# Patient Record
Sex: Male | Born: 1955
Health system: Southern US, Community
[De-identification: ages and names within clinical notes are randomized; demographics above are authoritative.]

## PROBLEM LIST (undated history)

## (undated) DIAGNOSIS — R0989 Other specified symptoms and signs involving the circulatory and respiratory systems: Secondary | ICD-10-CM

## (undated) DIAGNOSIS — Z91199 Patient's noncompliance with other medical treatment and regimen due to unspecified reason: Secondary | ICD-10-CM

## (undated) DIAGNOSIS — N183 Chronic kidney disease, stage 3 unspecified: Secondary | ICD-10-CM

## (undated) DIAGNOSIS — E785 Hyperlipidemia, unspecified: Secondary | ICD-10-CM

## (undated) DIAGNOSIS — E119 Type 2 diabetes mellitus without complications: Secondary | ICD-10-CM

## (undated) DIAGNOSIS — Z9119 Patient's noncompliance with other medical treatment and regimen: Secondary | ICD-10-CM

## (undated) DIAGNOSIS — I739 Peripheral vascular disease, unspecified: Secondary | ICD-10-CM

## (undated) DIAGNOSIS — I5189 Other ill-defined heart diseases: Secondary | ICD-10-CM

## (undated) DIAGNOSIS — I2510 Atherosclerotic heart disease of native coronary artery without angina pectoris: Secondary | ICD-10-CM

## (undated) DIAGNOSIS — I10 Essential (primary) hypertension: Secondary | ICD-10-CM

## (undated) HISTORY — PX: CORONARY ARTERY BYPASS GRAFT: SHX141

---

## 1997-06-05 ENCOUNTER — Emergency Department (HOSPITAL_COMMUNITY): Admission: EM | Admit: 1997-06-05 | Discharge: 1997-06-05 | Payer: Self-pay | Admitting: Emergency Medicine

## 2001-01-06 ENCOUNTER — Emergency Department (HOSPITAL_COMMUNITY): Admission: EM | Admit: 2001-01-06 | Discharge: 2001-01-06 | Payer: Self-pay | Admitting: Emergency Medicine

## 2001-01-06 ENCOUNTER — Encounter: Payer: Self-pay | Admitting: Emergency Medicine

## 2001-01-28 DIAGNOSIS — I25110 Atherosclerotic heart disease of native coronary artery with unstable angina pectoris: Secondary | ICD-10-CM | POA: Insufficient documentation

## 2001-01-28 DIAGNOSIS — I2510 Atherosclerotic heart disease of native coronary artery without angina pectoris: Secondary | ICD-10-CM

## 2001-04-06 ENCOUNTER — Emergency Department (HOSPITAL_COMMUNITY): Admission: EM | Admit: 2001-04-06 | Discharge: 2001-04-06 | Payer: Self-pay | Admitting: Emergency Medicine

## 2001-08-14 ENCOUNTER — Encounter: Payer: Self-pay | Admitting: Emergency Medicine

## 2001-08-14 ENCOUNTER — Observation Stay (HOSPITAL_COMMUNITY): Admission: EM | Admit: 2001-08-14 | Discharge: 2001-08-16 | Payer: Self-pay | Admitting: Emergency Medicine

## 2001-08-15 ENCOUNTER — Encounter: Payer: Self-pay | Admitting: Cardiology

## 2001-09-15 ENCOUNTER — Inpatient Hospital Stay (HOSPITAL_COMMUNITY): Admission: EM | Admit: 2001-09-15 | Discharge: 2001-09-19 | Payer: Self-pay | Admitting: Emergency Medicine

## 2001-09-15 ENCOUNTER — Encounter: Payer: Self-pay | Admitting: Cardiology

## 2002-02-25 ENCOUNTER — Emergency Department (HOSPITAL_COMMUNITY): Admission: EM | Admit: 2002-02-25 | Discharge: 2002-02-25 | Payer: Self-pay

## 2002-04-30 ENCOUNTER — Emergency Department (HOSPITAL_COMMUNITY): Admission: EM | Admit: 2002-04-30 | Discharge: 2002-04-30 | Payer: Self-pay

## 2002-07-13 ENCOUNTER — Encounter: Admission: RE | Admit: 2002-07-13 | Discharge: 2002-08-11 | Payer: Self-pay | Admitting: Orthopaedic Surgery

## 2002-09-07 ENCOUNTER — Inpatient Hospital Stay (HOSPITAL_COMMUNITY): Admission: EM | Admit: 2002-09-07 | Discharge: 2002-09-09 | Payer: Self-pay | Admitting: Emergency Medicine

## 2002-09-07 ENCOUNTER — Encounter: Payer: Self-pay | Admitting: Emergency Medicine

## 2002-12-19 ENCOUNTER — Emergency Department (HOSPITAL_COMMUNITY): Admission: EM | Admit: 2002-12-19 | Discharge: 2002-12-19 | Payer: Self-pay | Admitting: Emergency Medicine

## 2003-01-29 DIAGNOSIS — E119 Type 2 diabetes mellitus without complications: Secondary | ICD-10-CM | POA: Insufficient documentation

## 2003-06-22 ENCOUNTER — Emergency Department (HOSPITAL_COMMUNITY): Admission: EM | Admit: 2003-06-22 | Discharge: 2003-06-22 | Payer: Self-pay | Admitting: Emergency Medicine

## 2003-09-17 ENCOUNTER — Emergency Department (HOSPITAL_COMMUNITY): Admission: EM | Admit: 2003-09-17 | Discharge: 2003-09-17 | Payer: Self-pay | Admitting: Emergency Medicine

## 2006-05-21 ENCOUNTER — Emergency Department (HOSPITAL_COMMUNITY): Admission: EM | Admit: 2006-05-21 | Discharge: 2006-05-21 | Payer: Self-pay | Admitting: Emergency Medicine

## 2006-05-23 ENCOUNTER — Emergency Department (HOSPITAL_COMMUNITY): Admission: EM | Admit: 2006-05-23 | Discharge: 2006-05-23 | Payer: Self-pay | Admitting: Family Medicine

## 2006-05-26 ENCOUNTER — Ambulatory Visit: Payer: Self-pay | Admitting: Cardiology

## 2006-06-05 ENCOUNTER — Ambulatory Visit: Payer: Self-pay | Admitting: Internal Medicine

## 2006-06-19 ENCOUNTER — Ambulatory Visit: Payer: Self-pay | Admitting: Cardiology

## 2006-07-01 ENCOUNTER — Ambulatory Visit: Payer: Self-pay | Admitting: Internal Medicine

## 2006-07-15 ENCOUNTER — Ambulatory Visit: Payer: Self-pay | Admitting: Internal Medicine

## 2006-08-01 ENCOUNTER — Inpatient Hospital Stay (HOSPITAL_COMMUNITY): Admission: EM | Admit: 2006-08-01 | Discharge: 2006-08-06 | Payer: Self-pay | Admitting: Emergency Medicine

## 2006-08-01 ENCOUNTER — Ambulatory Visit: Payer: Self-pay | Admitting: Cardiology

## 2007-10-22 ENCOUNTER — Ambulatory Visit: Payer: Self-pay | Admitting: Nurse Practitioner

## 2007-10-22 DIAGNOSIS — E7800 Pure hypercholesterolemia, unspecified: Secondary | ICD-10-CM | POA: Insufficient documentation

## 2007-10-22 DIAGNOSIS — F4321 Adjustment disorder with depressed mood: Secondary | ICD-10-CM | POA: Insufficient documentation

## 2007-10-22 DIAGNOSIS — Z9115 Patient's noncompliance with renal dialysis: Secondary | ICD-10-CM

## 2007-10-22 LAB — CONVERTED CEMR LAB
Blood Glucose, Fingerstick: 153
Glucose, Urine, Semiquant: NEGATIVE
Hgb A1c MFr Bld: 7.6 %
Protein, U semiquant: 100
Specific Gravity, Urine: >=1.03
Urobilinogen, UA: 1

## 2007-10-23 ENCOUNTER — Ambulatory Visit: Payer: Self-pay | Admitting: *Deleted

## 2007-10-23 ENCOUNTER — Telehealth (INDEPENDENT_AMBULATORY_CARE_PROVIDER_SITE_OTHER): Payer: Self-pay | Admitting: Nurse Practitioner

## 2007-10-23 DIAGNOSIS — N200 Calculus of kidney: Secondary | ICD-10-CM | POA: Insufficient documentation

## 2007-10-23 DIAGNOSIS — M549 Dorsalgia, unspecified: Secondary | ICD-10-CM | POA: Insufficient documentation

## 2007-10-23 DIAGNOSIS — K219 Gastro-esophageal reflux disease without esophagitis: Secondary | ICD-10-CM | POA: Insufficient documentation

## 2007-10-23 LAB — CONVERTED CEMR LAB
ALT: 19 units/L (ref 0–53)
Albumin: 4.3 g/dL (ref 3.5–5.2)
Alkaline Phosphatase: 88 units/L (ref 39–117)
Basophils Absolute: 0 10*3/uL (ref 0.0–0.1)
CO2: 24 meq/L (ref 19–32)
Calcium: 9.3 mg/dL (ref 8.4–10.5)
HDL: 32 mg/dL — ABNORMAL LOW (ref >39)
LDL Cholesterol: 167 mg/dL — ABNORMAL HIGH (ref 0–99)
Lymphs Abs: 1.9 10*3/uL (ref 0.7–4.0)
Neutro Abs: 3.3 10*3/uL (ref 1.7–7.7)
Neutrophils Relative %: 59 % (ref 43–77)
RBC: 5.23 M/uL (ref 4.22–5.81)
Sodium: 137 meq/L (ref 135–145)
TSH: 1.028 microintl units/mL (ref 0.350–4.50)
Total CHOL/HDL Ratio: 6.9
Total Protein: 7.6 g/dL (ref 6.0–8.3)
Triglycerides: 108 mg/dL (ref <150)
WBC: 5.7 10*3/uL (ref 4.0–10.5)

## 2007-11-20 ENCOUNTER — Encounter (INDEPENDENT_AMBULATORY_CARE_PROVIDER_SITE_OTHER): Payer: Self-pay | Admitting: Nurse Practitioner

## 2007-12-01 ENCOUNTER — Inpatient Hospital Stay (HOSPITAL_COMMUNITY): Admission: EM | Admit: 2007-12-01 | Discharge: 2007-12-14 | Payer: Self-pay | Admitting: Emergency Medicine

## 2007-12-01 ENCOUNTER — Ambulatory Visit: Payer: Self-pay | Admitting: Cardiovascular Disease

## 2007-12-03 ENCOUNTER — Encounter: Payer: Self-pay | Admitting: Thoracic Surgery (Cardiothoracic Vascular Surgery)

## 2007-12-03 ENCOUNTER — Encounter: Payer: Self-pay | Admitting: Internal Medicine

## 2007-12-03 ENCOUNTER — Ambulatory Visit: Payer: Self-pay | Admitting: Thoracic Surgery (Cardiothoracic Vascular Surgery)

## 2007-12-04 ENCOUNTER — Encounter: Payer: Self-pay | Admitting: Thoracic Surgery (Cardiothoracic Vascular Surgery)

## 2007-12-17 ENCOUNTER — Emergency Department (HOSPITAL_COMMUNITY): Admission: EM | Admit: 2007-12-17 | Discharge: 2007-12-18 | Payer: Self-pay | Admitting: Emergency Medicine

## 2007-12-29 ENCOUNTER — Emergency Department (HOSPITAL_COMMUNITY): Admission: EM | Admit: 2007-12-29 | Discharge: 2007-12-29 | Payer: Self-pay | Admitting: Emergency Medicine

## 2009-06-24 IMAGING — CR DG CHEST 2V
2 series · 2 of 2 positions shown · non-contrast
Comparison: 12/11/2007

CLINICAL DATA: Shortness of breath, recently status post CABG

CHEST - 2 VIEW

[w chest pa]
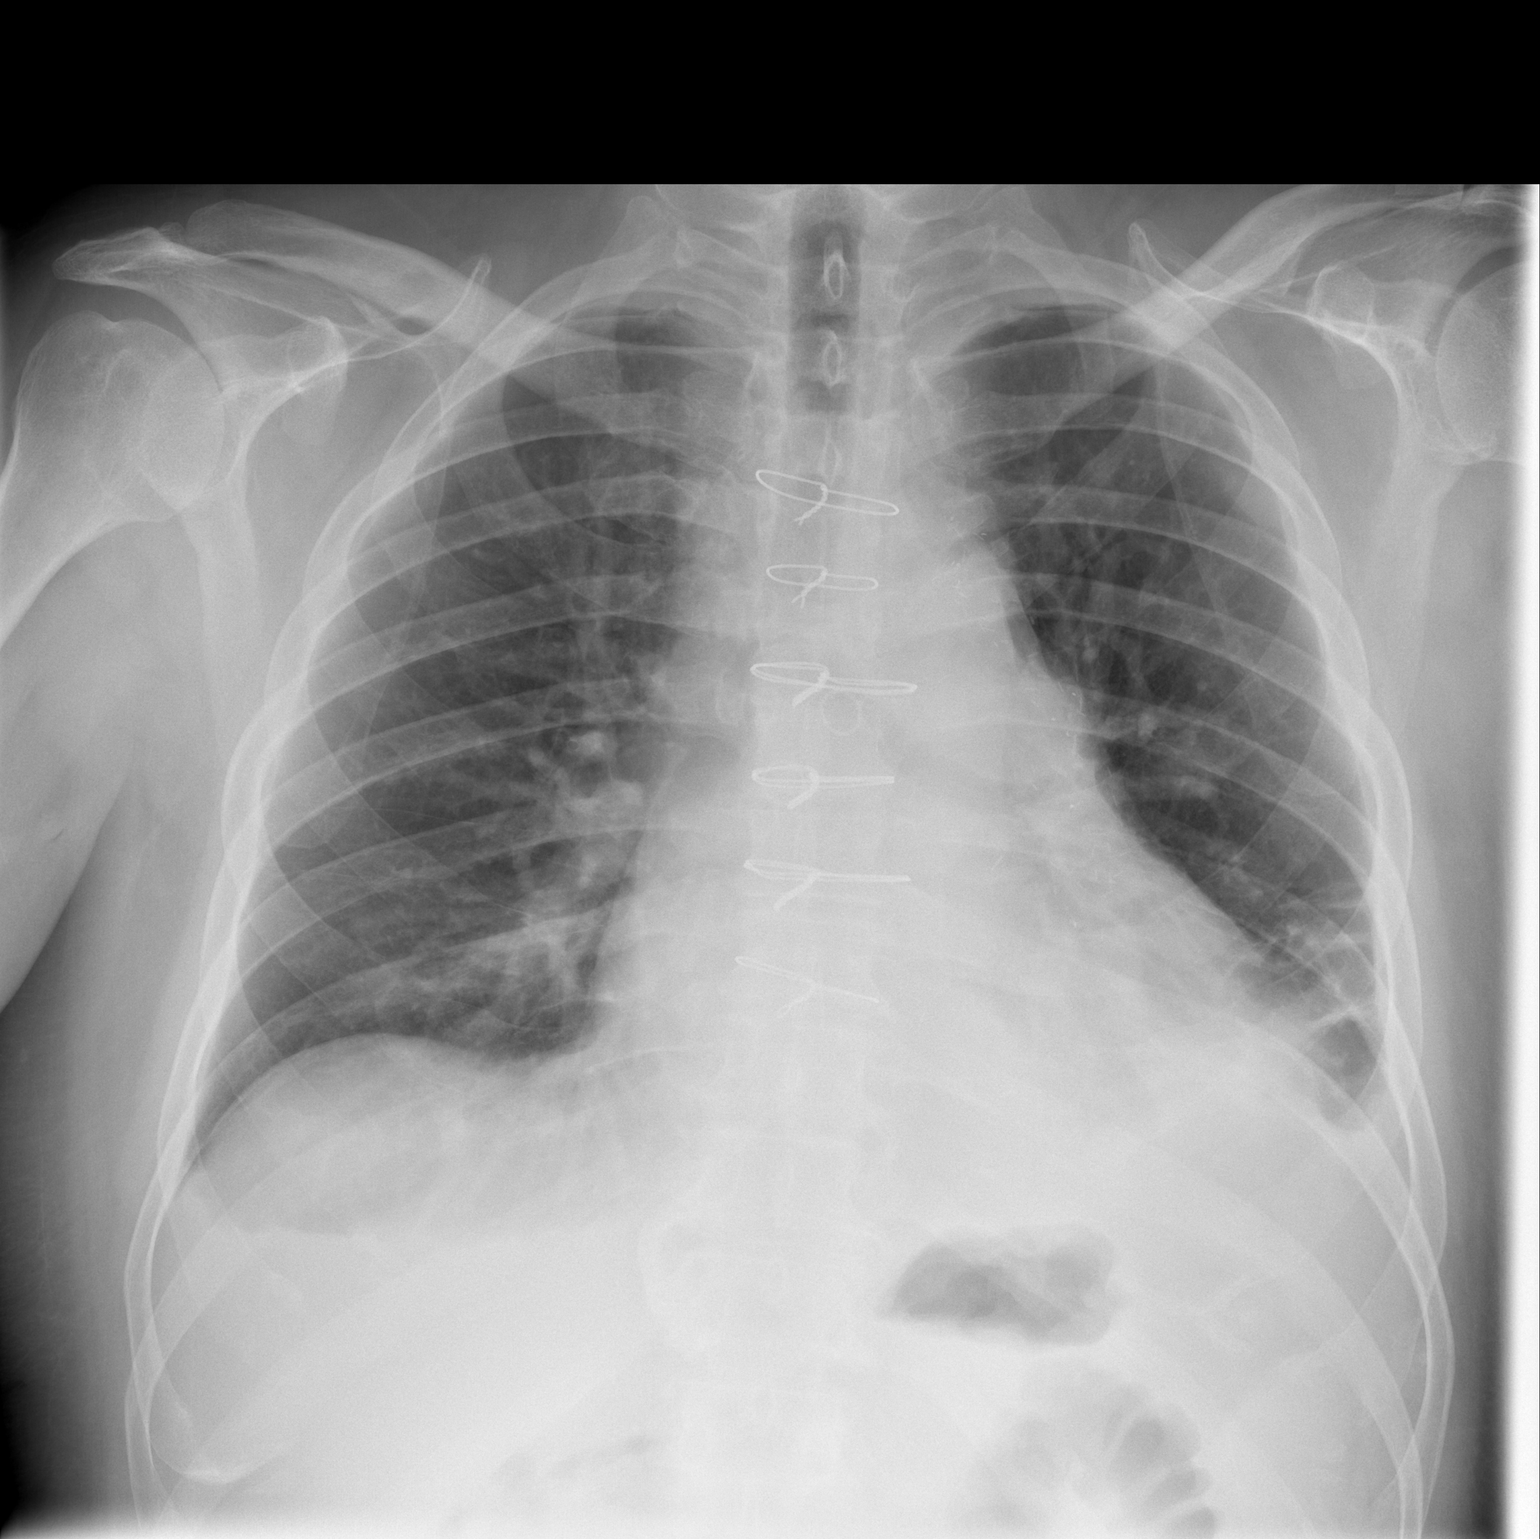

[w chest lat]
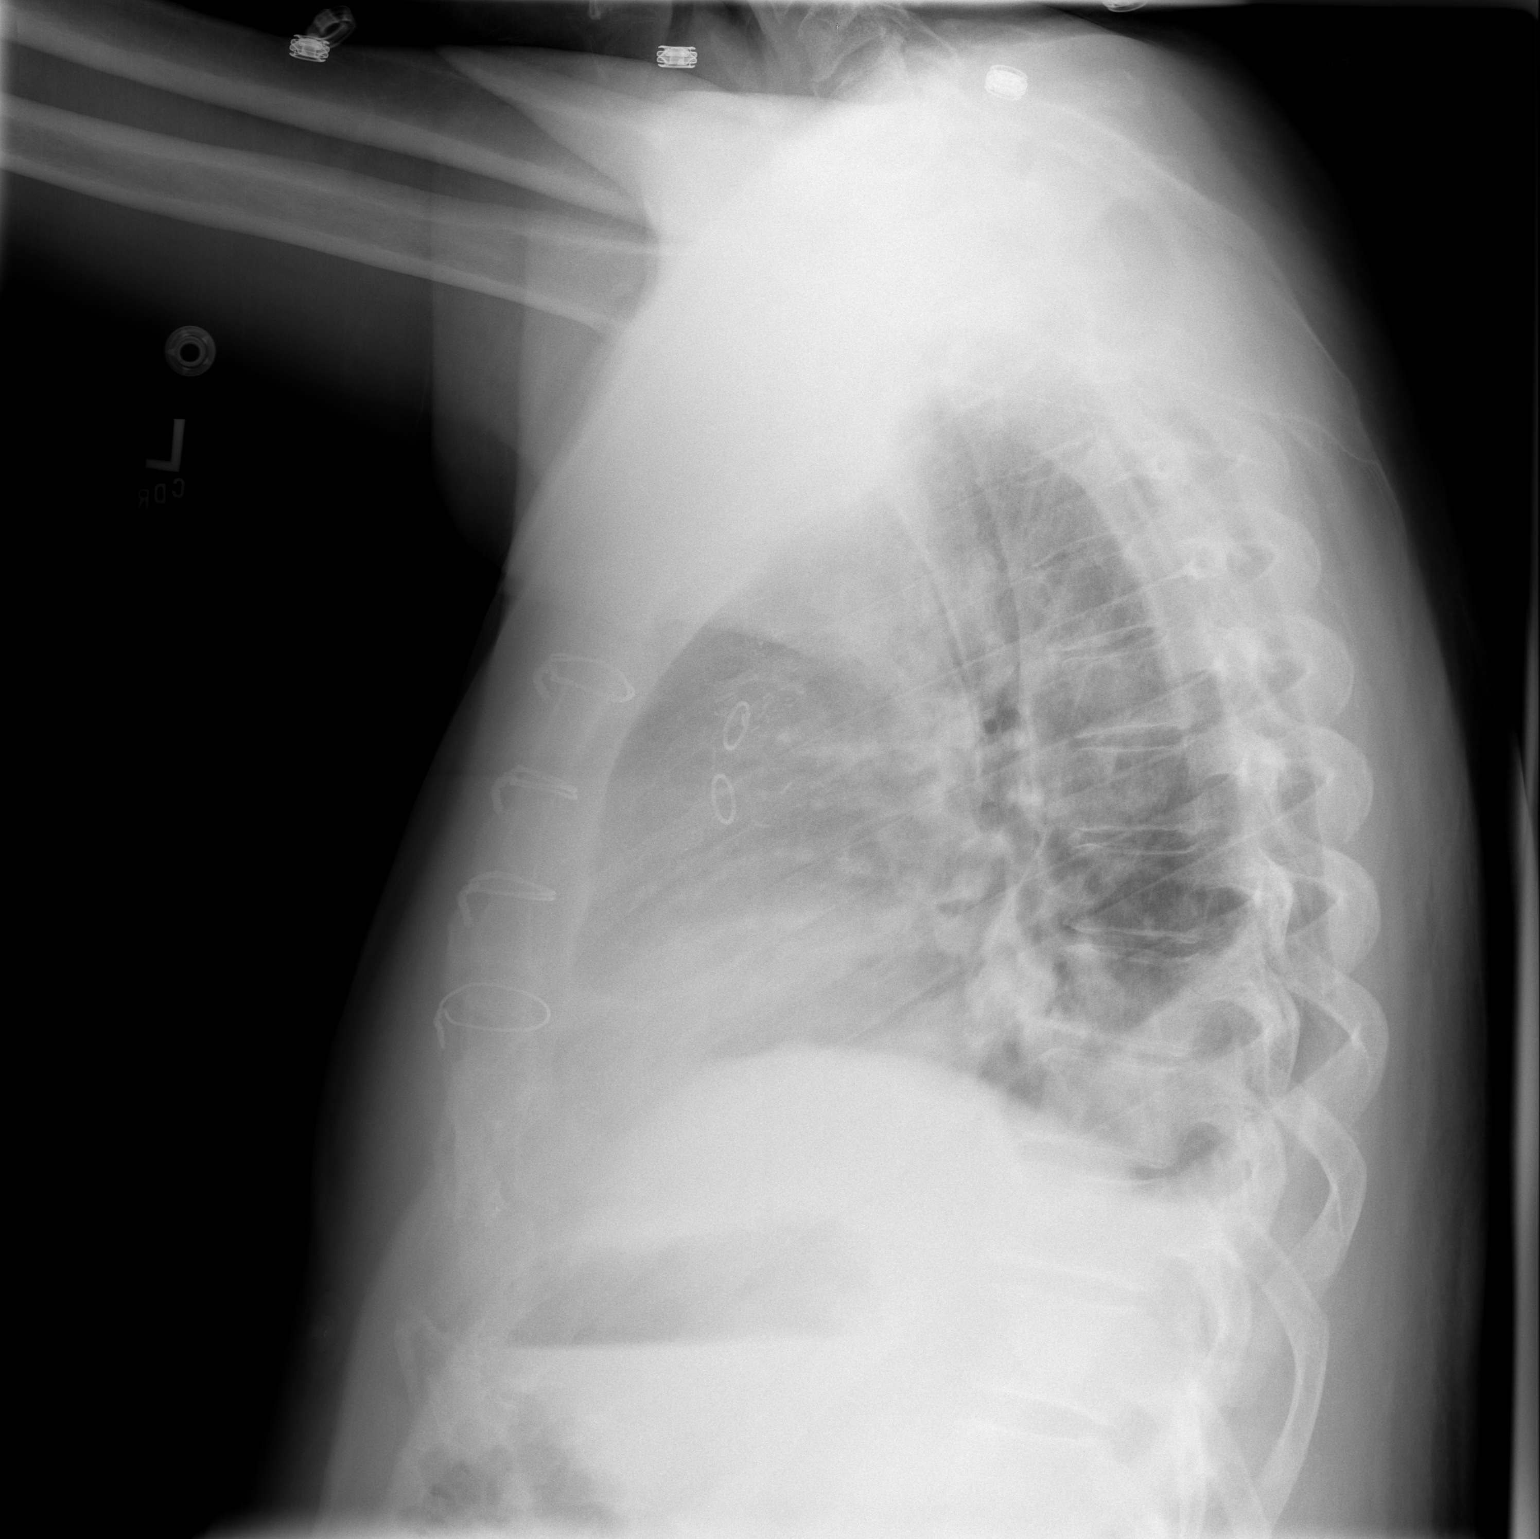

[2 of 2 positions shown; findings below may reference images not displayed]

FINDINGS: The patient is status post CABG.  Aeration has improved
slightly.  Decreased bibasilar atelectasis with persistent small
effusions noted.  No pneumothorax.
IMPRESSION: No new acute finding, improved aeration bilaterally.

## 2009-07-06 IMAGING — CR DG CHEST 2V
2 series · 2 of 2 positions shown · non-contrast
Comparison: 12/17/2007

CLINICAL DATA: Chest pain and cough.  Postop from coronary bypass
grafting approximately 3 weeks ago.

CHEST - 2 VIEW

[w chest pa]
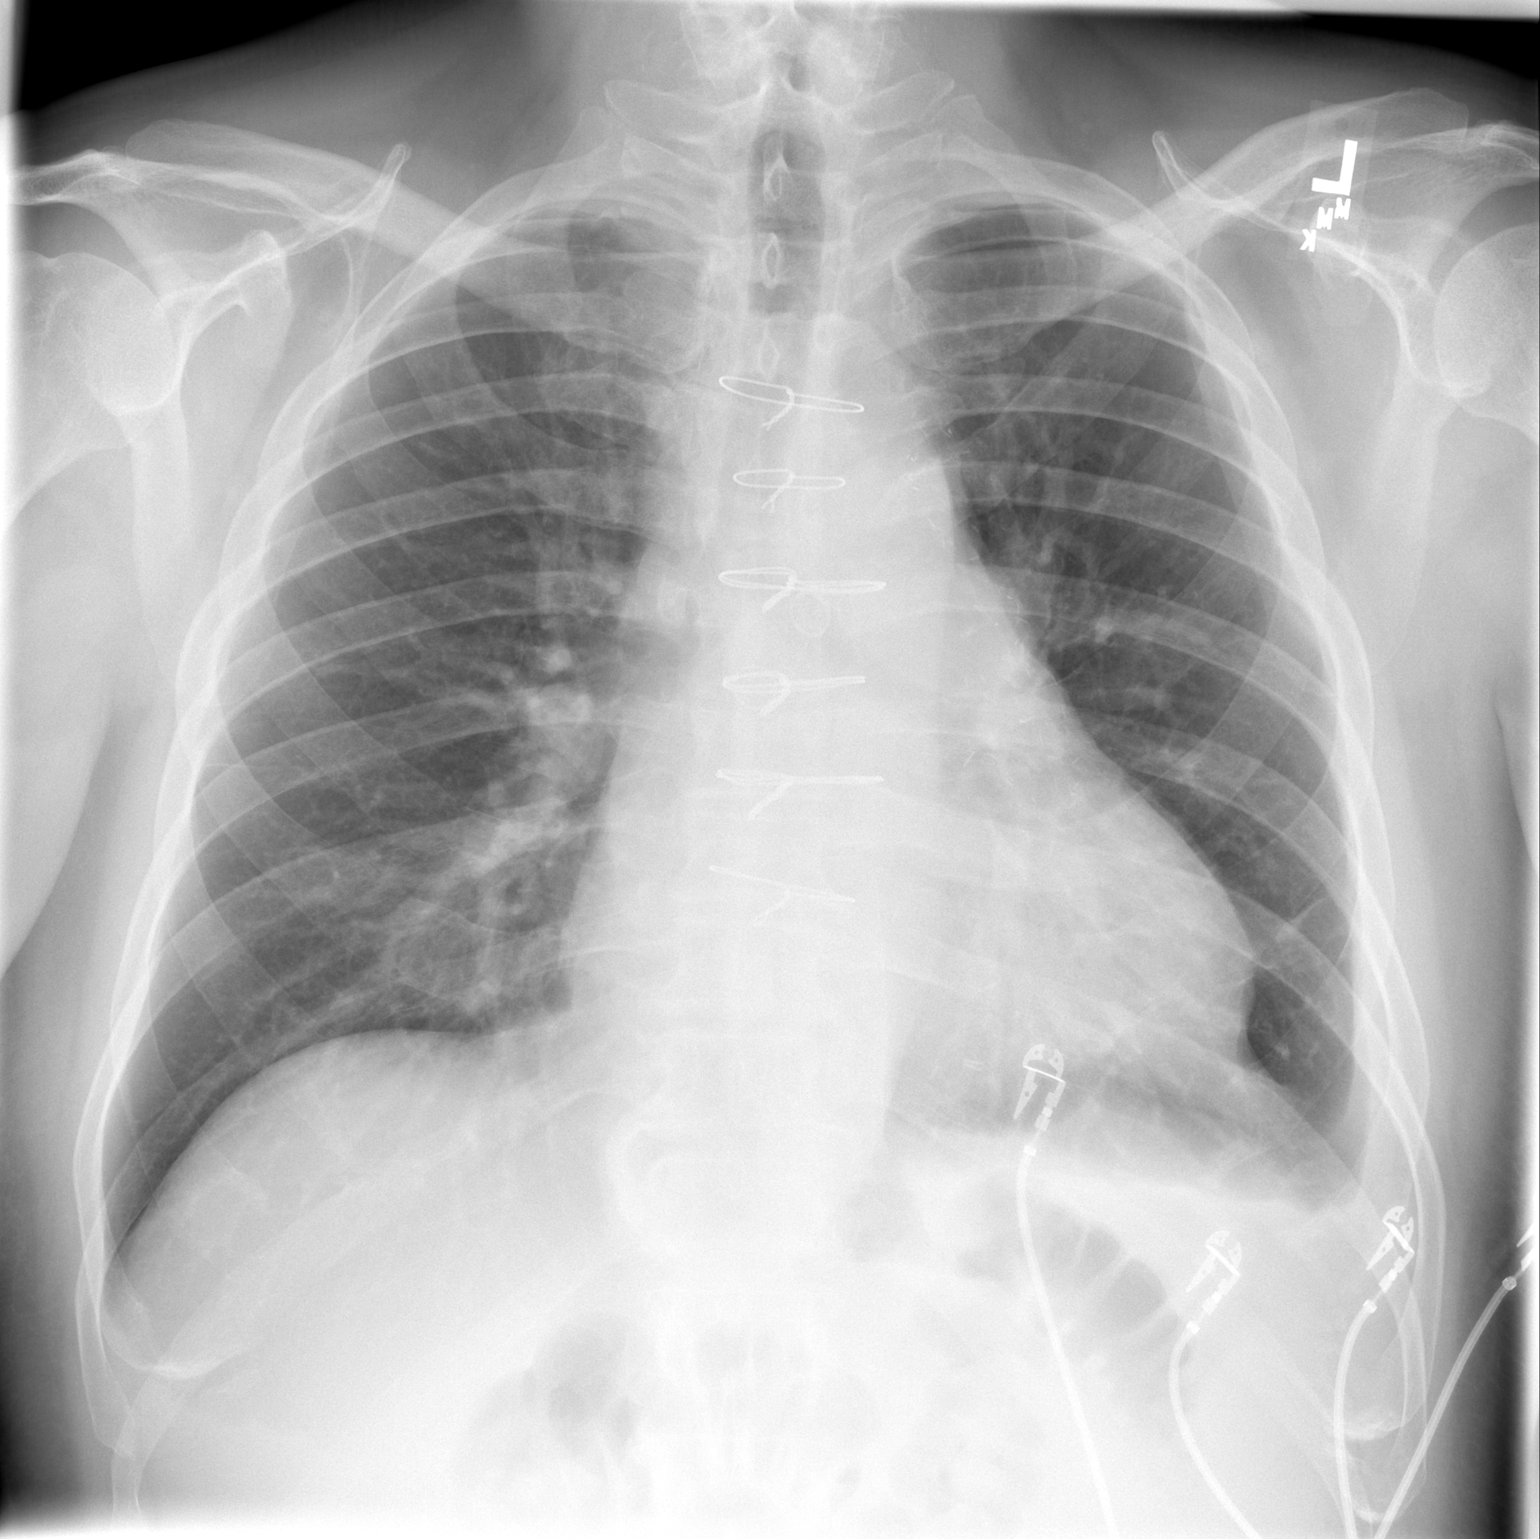

[w chest lat]
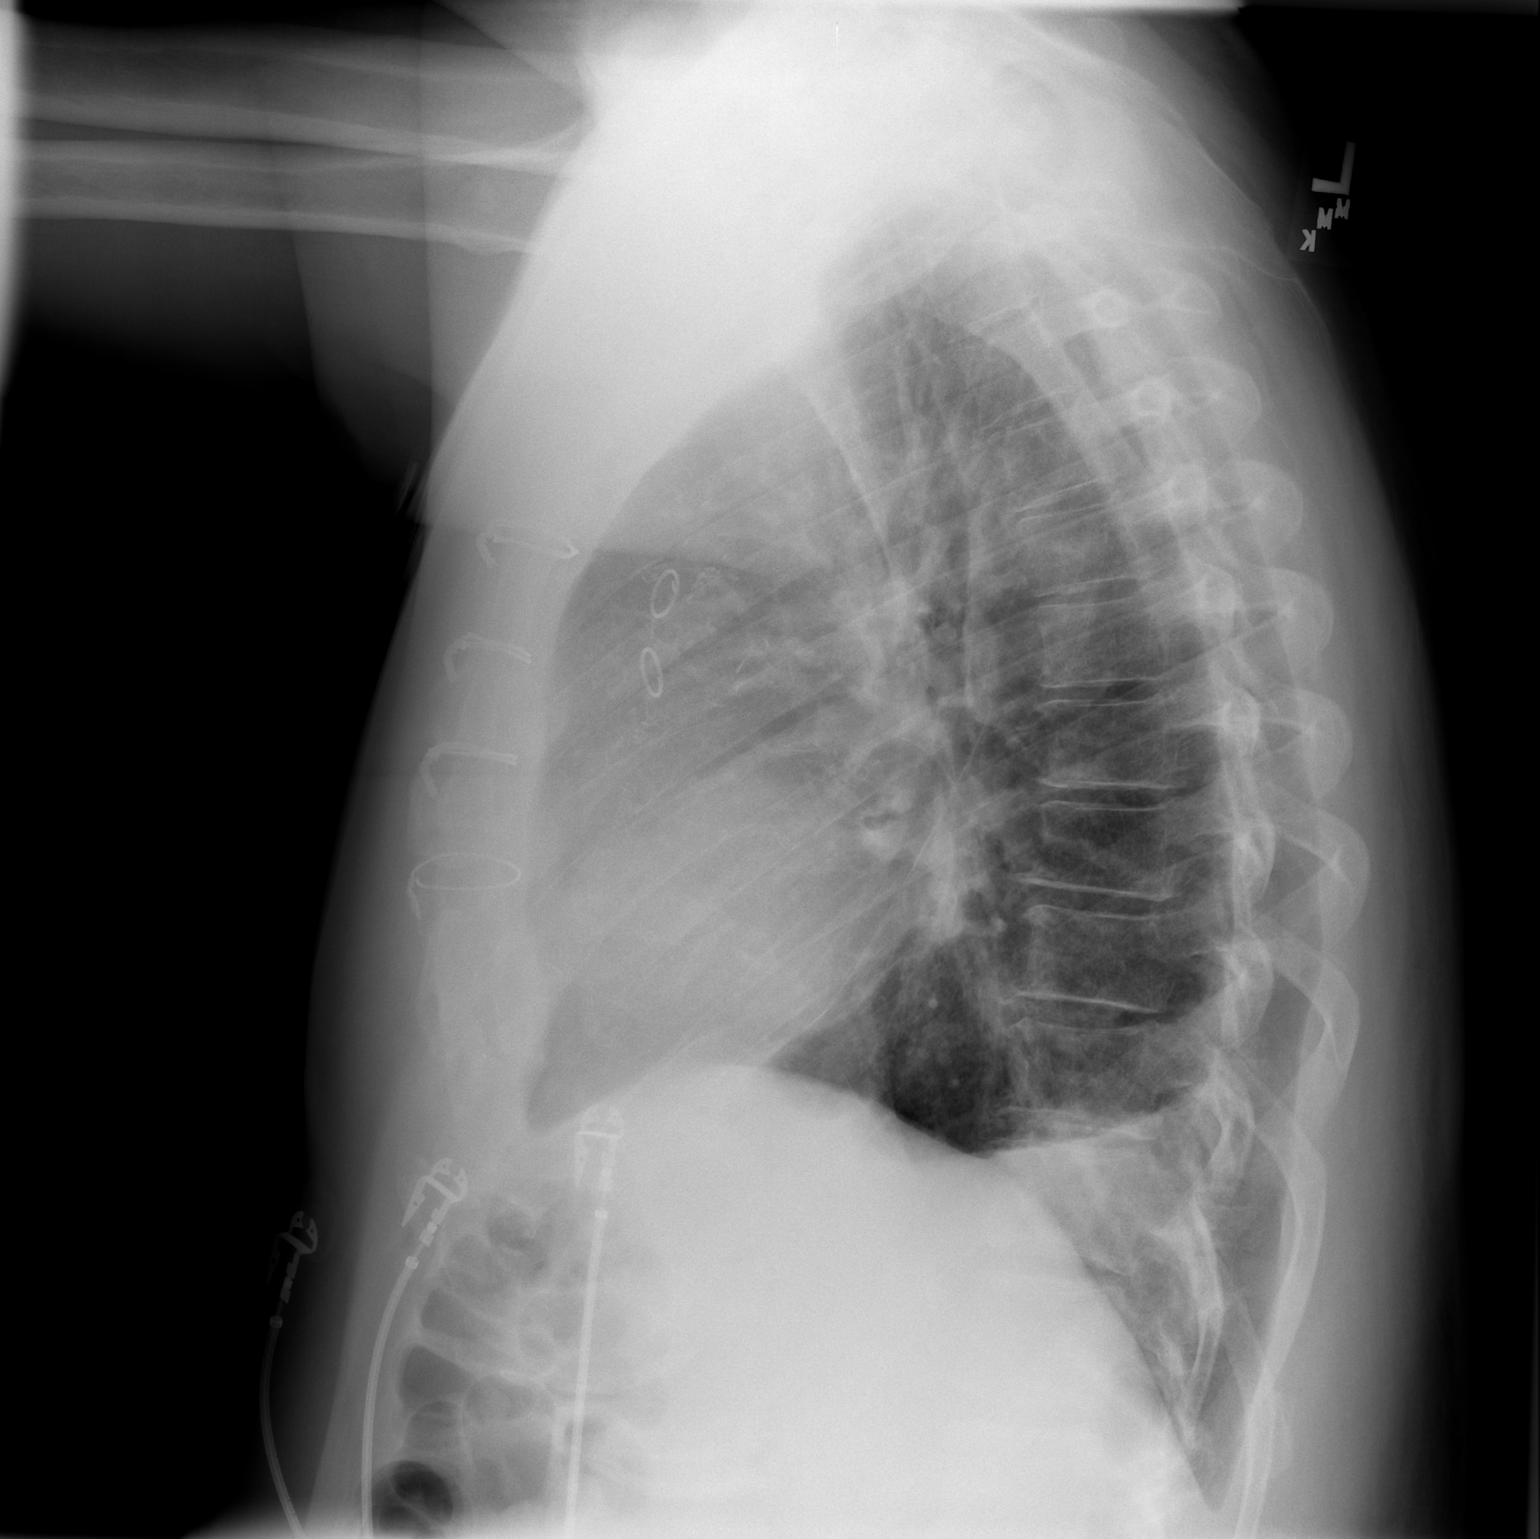

[2 of 2 positions shown; findings below may reference images not displayed]

FINDINGS: Decreased atelectasis is seen in the left lower lobe
since prior study.  Decrease in pleural effusions is also noted,
with small residual left pleural effusion seen posteriorly.

Mild cardiomegaly remains stable.  There is no evidence of
pulmonary edema or consolidation.  The patient has undergone
coronary bypass grafting.
IMPRESSION: Decreased pleural effusions and left lower lobe atelectasis, with a
small residual left pleural effusion noted.  No new findings.

## 2010-02-18 ENCOUNTER — Encounter: Payer: Self-pay | Admitting: Thoracic Surgery (Cardiothoracic Vascular Surgery)

## 2010-06-12 NOTE — Op Note (Signed)
NAMESLAYTER, MOORHOUSE             ACCOUNT NO.:  [id number]   MEDICAL RECORD NO.:  [id number]          PATIENT TYPE:  INP   LOCATION:  2303                         FACILITY:  [facility]   PHYSICIAN:  Nahome Bublitz. Dorris Fetch, M.D.DATE OF BIRTH:  March 22, 1955   DATE OF PROCEDURE:  12/09/2007  DATE OF DISCHARGE:                               OPERATIVE REPORT   PREOPERATIVE DIAGNOSIS:  A 3-vessel coronary artery disease status post  myocardial infarction.   POSTOPERATIVE DIAGNOSIS:  A 3-vessel coronary artery disease status post  myocardial infarction.   PROCEDURES:  Median sternotomy, extracorporeal circulation; coronary  artery bypass grafting x5 (left internal mammary artery to left anterior  descending, saphenous vein graft to first diagonal, left radial artery  to obtuse marginal-1, saphenous vein graft to posterior descending and  left posterolateral (obtuse marginal-3)); endoscopic vein harvest, right  leg.   SURGEON:  Delrico Minehart. Dorris Fetch, MD   ASSISTANTS:  1. Coral Ceo, PA  2. Stephanie Acre. Dominick, PA   ANESTHESIA:  General.   FINDINGS:  Diffusely diseased coronaries, fair quality target vessels,  good quality conduits.   CLINICAL NOTE:  Darrell [name] is a 55 year old gentleman with a  longstanding history of coronary artery disease who presented with a non-  Q-wave myocardial infarction.  At catheterization, he had severe 3-  vessel coronary artery disease.  He was referred for coronary artery  bypass grafting.  The indications, risks, benefits, and alternatives  were discussed in detail with the patient, he understood and accepted  the risk and agreed to proceed.   OPERATIVE NOTE:  Darrell [name] was brought to the preop holding area on  December 09, 2007.  There, the lines placed by Anesthesia for monitoring  arterial, central venous, and pulmonary arterial pressure.  Intravenous  antibiotics were administered.  He was taken to the operating room,  anesthetized and  intubated.  A Foley catheter was placed.  The chest,  abdomen, legs, and left arm were prepped and draped in the usual  fashion.  An incision was made in the volar aspect of the left wrist  overlying the radial pulse.  A short distal incision was made initially.  The skin and subcutaneous tissues were divided.  The fascia was incised.  The radial artery was mobilized over a short distance distally.  There  was an excellent pulse distally with proximal occlusion confirming the  results of the preoperative Allen test.  The incision then was extended  to just below the antecubital fossa and the radial artery was harvested  using the harmonic scalpel.  Simultaneously, incision was made in the  medial aspect of the right leg at the level of the knee and the greater  saphenous vein was harvested endoscopically from the right leg, it was  of good quality.  Simultaneously, a median sternotomy was performed and  the left internal mammary artery was harvested using the standard  technique.  This was relatively difficult due to the noncompliance of  the patient's chest wall, but the mammary artery was a good quality  conduit.  A 5000 units of heparin was administered during the vessel  harvest.  Remaining of the full heparin dose was given prior to opening  the pericardium.   The pericardium was opened.  The ascending aorta was inspected.  There  was no atherosclerotic disease.  The aorta was cannulated via concentric  2-0 Ethibond pledgeted pursestring sutures.  A dual stage venous cannula  was placed via pursestring suture in the right atrial appendage.  Cardiopulmonary bypass was instituted, and the patient was cooled to 32  degrees Celsius.  The coronary arteries were inspected and anastomotic  sites were chosen.  The conduits were inspected and cut to length.  A  foam pad was placed in the pericardium to protect the left phrenic  nerve.  A temperature probe was placed in the myocardial septum  and a  cardioplegic cannula was placed in the ascending aorta.   The aorta was cross-clamped.  The left ventricle was emptied via the  aortic root vent.  Cardiac arrest was achieved with a combination of  cold antegrade blood cardioplegia and topical iced saline.  After  achieving a complete diastolic arrest and myocardial septal cooling to  10 degrees Celsius with 1 L of antegrade cardioplegia, following distal  anastomoses were performed.   First, a reverse saphenous vein graft was placed sequentially to the  posterior descending branch of the right coronary and the distal  posterolateral obtuse marginal 3 branch of the left coronary.  This was  a codominant system.  The posterior descending was a 1.5-mm vessel as  was the left posterolateral/OM-3, both had diffuse plaquing.  The vein  was anastomosed side-to-side to the posterior descending and end-to-side  to the posterolateral.  There was excellent flow through the graft.  Cardioplegia was administered.  There was good flow and good hemostasis.   Next, a reverse saphenous vein graft was placed end-to-side to the first  diagonal branch of the LAD.  This was a previously stented vessel.  It  was diffusely diseased but did accept a 1.5-mm probe.  The vein graft  was anastomosed end-to-side with a running 7-0 Prolene suture.  There  was good flow through this graft as well.  Cardioplegia was  administered.  There was good hemostasis.   Next, the distal end of the left radial artery was beveled.  It was  anastomosed end-to-side to the large dominant obtuse marginal-1 branch  of the left circumflex.  This vessel had extremely tight stenosis, it  was diffusely diseased.  It was a 2-mm vessel.  Site of the anastomosis  was partially intramyocardial.  Radial artery was anastomosed end-to-  side with a running 8-0 Prolene suture.  Additional cardioplegia was  administered down the vein grafts.  In the aortic root, there was good   backbleeding from the radial artery.   Next, the left internal mammary artery was brought through a window in  the pericardium.  The distal end was beveled and was anastomosed end-to-  side to the mid LAD.  There was diffuse disease in the distal LAD, and  there was no place to graft into the distal LAD beyond this, the heavily  diseased portion.  The midportion was compromised by a proximal 90%  lesion.  A 1.5-mm probe did pass to the apex.  The LAD was a 2-mm  vessel.  At the site of the anastomosis, the mammary was a 2-mm conduit.  The anastomosis was performed with running 8-0 Prolene suture.  At the  completion of the mammary to LAD anastomosis, bulldogs clamps were  briefly  removed, inspected for hemostasis.  Immediate rapid septal  rewarming was noted.  Bulldog clamp was placed.  The mammary pedicle was  tacked to the epicardial surface of the heart with 6-0 Prolene sutures.   Additional cardioplegia was administered.  The conduits were cut to  length.  The cardioplegic cannula then was removed in the ascending  aorta.  The proximal vein graft anastomoses were performed to 4.5-mm  punch aortotomies with running 6-0 Prolene sutures.  The proximal  anastomosis for the left radial artery was placed into the hood of the  vein graft to the diagonal.  This anastomosis was performed with a  running 7-0 Prolene suture.  At the completion of the final proximal  anastomoses, the patient was placed in Trendelenburg position.  Lidocaine was administered.  The bulldog clamps were removed from the  left mammary artery.  The aortic root was de-aired, and the aortic cross-  clamp was removed.  The total cross-clamp time was 83 minutes.   The patient required a single defibrillation with 20 joules and remained  in sinus rhythm thereafter.  While the patient is being rewarmed, all  proximal and distal anastomoses were inspected for hemostasis.  Epicardial pacing wires were placed on the right  ventricle and right  atrium.  The patient had rewarmed to a core temperature of 37 degrees  Celsius.  He was weaned from cardiopulmonary bypass without difficulty  on the first attempt.  The total bypass time was 126 minutes.  The  patient was DDD paced for rate and was on no inotropic support at the  time of separation from bypass.  The total bypass time was 126 minutes.  The initial cardiac index was greater than 2 L/min/sq. m and the patient  remained hemodynamically stable throughout post-bypass period.   A test dose of protamine was administered and as well tolerated.  The  atrial and aortic cannulas were removed.  The remaining of the protamine  was administered without incident.  The chest was irrigated with 1 L of  warm normal saline containing 1 g of vancomycin.  Hemostasis was  achieved.  A left pleural and 2 mediastinal chest tubes were placed  through separate subcostal incisions.  The pericardium was  reapproximated with interrupted 3-0 silk sutures, came together easily  without tension.  The sternum was closed with interrupted single and  double heavy-gauge stainless steel wires.  Pectoralis fascia,  subcutaneous tissue, and skin were closed in standard fashion.  All  sponge, needle, and instrument counts were correct at the end of the  procedure.  There were no intraoperative complications.  The patient was  taken from the operating room to the Surgical Intensive Care Unit in  fair condition.      Darrell [name] Dorris Fetch, M.D.  Electronically Signed     SCH/MEDQ  D:  12/09/2007  T:  12/10/2007  Job:  [id number]   cc:   Luis Abed, MD, [facility]

## 2010-06-12 NOTE — Assessment & Plan Note (Signed)
[facility]                            CARDIOLOGY OFFICE NOTE   NAME:[name], Darrell SOULLIERE                    MRN:          [id number]  DATE:06/19/2006                            DOB:          09/03/1955    PRIMARY CARE:  [facility].   REASON FOR VISIT:  Cardiac followup.   HISTORY OF PRESENT ILLNESS:  I met Darrell [name] for the first time in  late April.  His history is detailed in my previous note.  At that time,  I placed him on Toprol XL 50 mg daily (prescription given), as well as  samples of Caduet containing 5 mg of Norvasc.  He reports taking both of  these medications, and has, in fact, seen [facility] in the interim.  He was given prescriptions for metformin 850 mg p.o. b.i.d. and  nitroglycerin 0.4 mg sublingual p.r.n., Zocor 20 mg p.o. nightly,  Glucometer with lancets, and benazepril hydrochlorothiazide 20/12.5 mg  p.o. daily.  He reports being unable to have any of these medicines  filled, however.  I see that his visit was on the 8th of May.  He  continues to report intermittent problems with chest pain, occasional  headaches, and also shortness of breath.  There has been no increase in  the pattern of these symptoms, however.  He remains significantly  hypertensive today.  His electrocardiogram is, however, normal, showing  sinus rhythm at 69 beats per minute.   ALLERGIES:  No known drug allergies.   PRESENT MEDICATIONS:  1. Toprol XL 50 mg p.o. daily.  2. Caduet 5/10 mg p.o. daily.   REVIEW OF SYSTEMS:  As described in the history of present illness.   PHYSICAL EXAMINATION:  Blood pressure is 180/116, heart rate is 66, the  patient is in no acute distress.  Examination of the neck shows no elevated jugular venous pressure or  loud bruits.  No thyromegaly is noted.  Lungs are clear without labored breathing.  CARDIAC EXAM:  Reveals a regular rate and rhythm, no loud murmur or  gallop.  EXTREMITIES:  Show no pitting edema.   IMPRESSION/RECOMMENDATION:  1. Hypertension:  Very difficult to control given history of      noncompliance and significant financial constraints.  I discussed      this with him today.  He states that he will probably be able to      get the prescriptions provided to him by [facility], filled at the      end of the month.  In the meanwhile, I have asked him to continue      his Toprol XL and his Caduet, and gave him samples of Avalide      150/12.5 mg daily.  If he is able to fill the prescriptions given      him by [facility], then he should substitute the Avalide with the      benazepril/hydrochlorothiazide.  He reports having followup with      [facility] coming up this month.  I will ask him then to resume an      aspirin and also to follow up with me  over the next month.  I      suspect the chances of controlling his blood pressure are going to      be small unless he is able to maintain some ability to take his      medicines regularly.  He will remain at high risk for adverse      cardiac outcomes.  2. Further plans to follow.     Jonelle Sidle, MD  Electronically Signed    SGM/MedQ  DD: 06/19/2006  DT: 06/19/2006  Job #: [id number]   cc:   Dineen Kid. Reche Dixon, M.D.

## 2010-06-12 NOTE — Consult Note (Signed)
Darrell [name], Darrell [name]             ACCOUNT NO.:  [id number]   MEDICAL RECORD NO.:  [id number]          PATIENT TYPE:  INP   LOCATION:  2928                         FACILITY:  [facility]   PHYSICIAN:  Chisom Aust. Dorris Fetch, M.D.DATE OF BIRTH:  1955-05-16   DATE OF CONSULTATION:  12/03/2007  DATE OF DISCHARGE:                                 CONSULTATION   REASON FOR CONSULTATION:  Three-vessel coronary artery disease, status  post MI.   HISTORY OF PRESENT ILLNESS:  Darrell [name] is a 55 year old gentleman  with a history of coronary artery disease dating back to 13.  He has  had multiple previous percutaneous interventions.  He was admitted on  December 01, 2007, with unstable coronary syndrome.  He began having  chest pain while watching TV, described it as sharp and 10/10.  He was  given aspirin and nitroglycerin and his pain resolved.  It lasted for  approximately 80 minutes in all.  He was brought to the emergency room  by EMS.  He did have some nausea.  There was no diaphoresis or vomiting.  He was admitted.  He did rule in for a myocardial infarction.  Yesterday, he underwent cardiac catheterization where he was found to  have severe three-vessel coronary artery disease.  An echocardiogram  likewise was done this morning, which showed preserved left ventricular  function and no significant valvular pathology.   PAST MEDICAL HISTORY:  1. Coronary artery disease, status post previous stent to the      circumflex x2, the right coronary x2, and the ramus intermedius.  2. Hypertension.  3. Hyperlipidemia.  4. Type 2 adult-onset diabetes non-insulin-dependent.  5. Gastroesophageal reflux.  6. Chronic back pain.  7. Nephrolithiasis.  8. Remote tobacco abuse.  9. Left elbow fracture.   MEDICATIONS ON ADMISSION:  1. Metformin 850 mg p.o. b.i.d.  2. Aspirin 325 mg daily.  3. Benazepril.  4. Hydrochlorothiazide 20/12.5 one tablet daily.  5. Toprol-XL daily.  6. Caduet 10/20 one-half  tablet daily.  7. Plavix 75 mg daily.   ALLERGIES:  He has no known drug allergies.   FAMILY HISTORY:  Significant for heart disease in both parents.  His  father died at an early age.   SOCIAL HISTORY:  He is married and has children.  He is disabled due to  cardiac disease and learning disability.  He has approximately 60-pack-  year history of smoking, but no longer smokes actively.  Does not use  alcoholic or drugs.   REVIEW OF SYSTEMS:  Denies fevers, chills, sweats, orthopnea, paroxysmal  nocturnal dyspnea, peripheral edema, excessive bleeding or bruising.  No  recent change in bowel or bladder habits.  No history of stroke or TIA  symptoms.  All other systems negative.   PHYSICAL EXAMINATION:  GENERAL:  Darrell [name] is a 55 year old white  male in no acute distress.  He is well developed and well nourished.  NEUROLOGIC:  He is alert and oriented x3 with no focal deficits.  HEENT:  Unremarkable.  NECK:  Supple without thyromegaly, adenopathy, or bruits.  CARDIAC:  Regular rate and rhythm.  Normal S1  and S2.  No rubs, murmurs,  or gallops.  LUNGS:  Clear with equal breath sounds bilaterally.  ABDOMEN:  Soft, nontender.  EXTREMITIES:  Without clubbing, cyanosis, or edema.  He has 2+ pulses  throughout.  Allen test on the left is equivocal.  Appears to be  relatively slow filling.   LABORATORY DATA:  Chest x-ray shows prominent pulmonary vasculature,  borderline cardiomegaly.  EKG showed sinus rhythm with a prolonged QT,  echocardiogram ejection fraction 60%, no significant valvular pathology.  His white count is 5.1, hematocrit 34, platelets 161.  Sodium 136,  potassium 4.7, BUN and creatinine 11 and 1.35, glucose is 146.  PT 13.3,  PTT was 31 on admission.  Albumin 3.3.  His peak CK was 116 with an MB  of 13.8.  His peak troponin was 2.29.   IMPRESSION:  Darrell [name] is a 55 year old gentleman with a longstanding  history of coronary artery disease.  He presents with a  non-Q-wave  myocardial infarction and at catheterization has severe three-vessel  coronary artery disease.  Coronary artery bypass grafting is indicated  for survival benefit as well as relief of symptoms.  Time of his surgery  will be dictated by his Plavix washout in the first available operating  date, which is Wednesday, December 09, 2007.  He currently is pain free  and stable.  If he were to develop recurrent unstable symptoms, it might  need to be done sooner.  However, his risk will be lower if he is  allowed to have complete Plavix washout.  He has complex diffuse three-  vessel coronary artery disease.  Long-term outcome will be dependent on  the quality of the vessels as determined intraoperatively as well as  risk factor modification.  I discussed in detail with him the nature  extend the operation, the need for general anesthesia, the incisions to  be used, and general conduct of the procedure.  We also discussed  expected hospital stay and overall recovery.  I discussed in detail with  them the indications, risks, benefits, and alternatives.  She  understands the risks include but not limited to death, stroke,  myocardial infarction, deep vein thrombosis, pulmonary embolism,  bleeding, possible need for transfusions, infections, as well as other  organ system dysfunction including respiratory, renal, hepatic, or  gastrointestinal complications.  He understands and accepts these risks  and agrees to proceed.      Salvatore Decent Dorris Fetch, M.D.  Electronically Signed     SCH/MEDQ  D:  12/03/2007  T:  12/04/2007  Job:  [id number]

## 2010-06-12 NOTE — Cardiovascular Report (Signed)
NAMEIOANNIS, SCHUH             ACCOUNT NO.:  [id number]   MEDICAL RECORD NO.:  [id number]          PATIENT TYPE:  INP   LOCATION:  3707                         FACILITY:  [facility]   PHYSICIAN:  [name], MD, FACCDATE OF BIRTH:  April 08, 1955   DATE OF PROCEDURE:  08/04/2006  DATE OF DISCHARGE:                            CARDIAC CATHETERIZATION   PRIMARY:  [facility].   PROCEDURE:  Left heart catheterization/coronary arteriography.   INDICATIONS:  Evaluate the patient with chest pain suggestive of  unstable angina.  He had previous stenting with 2 Cypher stents into his  ramus intermediate.   PROCEDURE NOTE:  Left heart catheterization performed via right femoral  artery.  The artery was cannulated using an anterior wall puncture.  A  #6-French arterial sheath was inserted via the modified Seldinger  technique.  Preformed Judkins and a pigtail catheter were utilized.  The  patient tolerated the procedure well and left the lab in stable  condition.   RESULTS:  Hemodynamics:  LV 150/22, AO 147/95.   Coronaries:  Left main was normal.  The LAD wrapped the apex.  There was  a mid long 40-50% stenosis.  There was distal long 80% stenosis.  The  apical vessel had diffuse moderate disease.  The circumflex included a  ramus intermediate with 2 Cypher stents.  These were 2.5 mm by report.  There was long in-stent restenosis of 60-70%.  Following this, there was  mid long 50-60% stenosis.  A first obtuse marginal was large and  branching.  There was long proximal 70% stenosis.  There was diffuse  proximal mid 50% stenosis.  There is an inferior branch with 60%  stenosis.  The AV groove immediately after the first obtuse marginal had  a somewhat long 80% stenosis.  More distal in the AV groove, there was a  90% stenosis which was more focal before 2 posterolateral branches.  The  right coronary artery was a dominant vessel.  It was calcified  throughout its mid segment.  There was a  mid 70% stenosis followed by 75-  80% stenosis.  The PDA had a long proximal 60% stenosis.  There was a  small vessel.   Left ventriculogram:  Left ventriculogram obtained in the RAO  projection.  The EF was 70%.  There were no regional wall motion  abnormalities.   CONCLUSION:  Severe circumflex disease, particularly the distal AV  groove.  There was more moderate to severe disease elsewhere with  calcified diabetic vessels.   PLAN:  I reviewed the films with partners.  The patient has had no  objective evidence of ischemia.  We could pursue aggressive medical  management, though I do not think he will comply with any lifestyle  changes.  Alternatively, we could consider CABG.  Finally, angioplasty  of the high-grade distal circumflex lesion would be possible.      [name], MD, [facility]  Electronically Signed     JH/MEDQ  D:  08/04/2006  T:  08/04/2006  Job:  [id number]

## 2010-06-12 NOTE — Cardiovascular Report (Signed)
Darrell [name], Darrell [name]             ACCOUNT NO.:  [id number]   MEDICAL RECORD NO.:  [id number]          PATIENT TYPE:  INP   LOCATION:  2807                         FACILITY:  [facility]   PHYSICIAN:  Bevelyn Buckles. Bensimhon, MDDATE OF BIRTH:  [birth year]   DATE OF PROCEDURE:  12/02/2007  DATE OF DISCHARGE:                            CARDIAC CATHETERIZATION   PATIENT IDENTIFICATION:  Darrell [name] is a 55 year old male with  diabetes, hypertension, hyperlipidemia and previous tobacco use.  He has  known coronary artery disease with multiple stents.  He is admitted with  non-ST elevation myocardial infarction.  He is brought to  catheterization lab for diagnostic angiography,  a baseline creatinine  which is between 1.3 and 1.5.   PROCEDURES PERFORMED:  1. Selective coronary angiography.  2. Left heart catheterization.  3. Left subclavian angiography.  4. StarClose femoral artery closure - failed.   DESCRIPTION OF PROCEDURE:  The risks and indication were explained.  Consent was signed and placed on the chart.  The right groin area was  prepped and draped in routine sterile fashion.  Initially a 5-French  arterial sheath was placed and using modified Seldinger technique a JL-5  catheter was used however unable to opacify the coronaries adequately so  we  switched out over wire for a 6-French sheath.  We then used JL-5 and  JR-4 catheters.  All catheter exchanges made over wire.  There wee no  apparent complications.   Throughout the procedure the patient was markedly hypertensive.  He was  treated with 20 mg of IV labetalol x3 and also has his nitroglycerin  drip titrated up.   At the end of the procedure we attempted to close his right femoral  arterial site with a StarClose device.  We did not get adequate  deployment and the device failed.  Manual pressure was held.   Given the difficulty we had opacifying his coronaries, a total of 160 cc  of contrast were used.    Central aortic  pressure is 181/115 with a mean of 150.  LV pressure is  174/14 with EDP of 29.  There is no aortic stenosis.   Left main had some mild luminal irregularities, otherwise normal.   LAD was a long vessel wrapping the apex.  It gave off a large first  diagonal.  In the mid-LAD after the first diagonal there is a 90%  tubular lesion.  There was a 40% in the mid section and 90% in the  apical LAD.  There was a large first diagonal which had a stent in its  proximal portion, there is a 90% in-stent restenosis of the distal part  of the stent followed by a long 99% lesion  and the diagonal was  subtotally occluded.   Left circumflex was a large codominant system.  It gave off a large  branching OM-1, several posterolaterals and a small PDA.  There was a  previously placed stent in the proximal portion of the circ crossing the  OM-1.  In the ostial portion of the OM-1 there is 99% focal stenosis.  This was followed by a 50- 60%  tubular stenosis;  in the lower branch of  the OM there was a 60% stenosis.   Throughout the mid AV groove circ there is a 50% stenosis and then a 90%  stenosis distally between the last posterolateral and the  PD.   Right coronary artery was a large codominant vessel, it had a stent in  the midsection.  There is a 40% proximal lesion of 50% mid-lesion within  the stent.  In the distal right coronary artery there is a 95% lesion  just after the bend.  There was a large acute marginal which fed the PD  territory.  There is a 99% focal lesion in this branch.   Left subclavian was injected;  there was no focal subclavian stenosis.  The LIMA was patent to the chest wall.   ASSESSMENT:  1. Severe three-vessel coronary artery disease, as above.  2. Severe hypertension.  3. Chronic renal insufficiency.   PLAN/DISCUSSION:  I suspect he would do best with bypass surgery.  We  will check an echo to evaluate LV function.  He will need aggressive  blood pressure control.   We will watch his BUN and creatinine closely.      Bevelyn Buckles. Bensimhon, MD  Electronically Signed     DRB/MEDQ  D:  12/02/2007  T:  12/02/2007  Job:  [id number]

## 2010-06-12 NOTE — Discharge Summary (Signed)
Darrell [name], Darrell [name]             ACCOUNT NO.:  [id number]   MEDICAL RECORD NO.:  [id number]          PATIENT TYPE:  INP   LOCATION:  2016                         FACILITY:  [facility]   PHYSICIAN:  Jonovan Boedecker. Dorris Fetch, M.D.DATE OF BIRTH:  January 14, 1956   DATE OF ADMISSION:  12/01/2007  DATE OF DISCHARGE:  12/14/2007                               DISCHARGE SUMMARY   ADMITTING DIAGNOSES:  1. Non-ST-segment-elevation myocardial infarction.  2. History of coronary artery disease (multiple percutaneous      transluminal coronary angioplasties with stents).  3. History of hypertension.  4. History of hyperlipidemia.  5. History of diabetes mellitus type 2.  6. History of gastroesophageal reflux disease.  7. History of chronic back pain.   DISCHARGE DIAGNOSES:  1. Non-ST-segment-elevation myocardial infarction.  2. History of coronary artery disease (multiple percutaneous      transluminal coronary angioplasties with stents).  3. History of hypertension.  4. History of hyperlipidemia.  5. History of diabetes mellitus type 2.  6. History of gastroesophageal reflux disease.  7. History of chronic back pain.  8. Acute blood loss anemia.  9. Renal insufficiency.  Questionable prior history, history of      nephrolithiasis, creatinine continued to decrease throughout      hospital course stay.   PROCEDURES:  1. Cardiac catheterization performed on December 02, 2007, by Dr.      Gala Romney.  This revealed severe three-vessel coronary artery      disease.  2. Echocardiogram done on December 03, 2007, which showed an ejection      fraction estimated to be 60%, aortic valve mildly calcified, mild      aortic root dilatation.  3. Coronary artery bypass grafting x5 (left internal mammary artery to      left anterior descending, saphenous vein graft to diagonal one,      left radial to first obtuse marginal artery, saphenous vein graft      to posterior descending artery and left posterolateral  with      endoscopic vein harvesting of the right leg by Dr. Dorris Fetch on      December 09, 2007).   HISTORY OF PRESENT ILLNESS:  This is a 55 year old Caucasian male with  past medical history of coronary artery disease (diagnosed in 1994) who  has had multiple PTCAs with stents.  He also has a history of  hypertension, hyperlipidemia, diabetes mellitus type 2, history of  tobacco abuse, and GERD, who began having chest pain while watching TV.  The patient denied any pain radiation; denied dyspnea, diaphoresis, or  emesis.  He did have some nauseousness, however.   The patient was brought to [facility] Emergency Room for further  evaluation and treatment.  He was found to have a non-STEMI.  He was  originally placed on a heparin drip and nitro drip and given Plavix.  He  was then placed on Integrilin.  He underwent the aforementioned cardiac  catheterization and echocardiogram.  Preoperative Doppler studies  revealed no significant internal carotid artery stenosis.  The patient  then underwent coronary artery bypass grafting surgery on December 09, 2007,  with Dr. Dorris Fetch.   BRIEF HOSPITAL COURSE STAY:  The patient was afebrile and  hemodynamically stable.  Postoperatively, he was extubated the evening  of surgery.  Chest tubes were removed by December 10, 2007.  Followup  chest x-ray revealed bibasilar atelectasis, small left pleural effusion,  questionable small right apical pneumothorax.  The patient was slowly  progressing with cardiac rehab.  He was transferred from the Intensive  Care Unit to 2000 for further convalescence.  He was found to have  elevated creatinine(It went as high as 1.48).  He was volume overloaded,  and he was diuresed.  His creatinine was monitored closely, and it  gradually began to decrease.  The patient was tachycardic.  His beta-  blocker was increased accordingly.  In addition, he did develop a fever  to 100/100.1, which was felt likely secondary  to atelectasis.  Currently, on postoperative day #4, the patient is tolerating a diet.  He is ambulating.  He has no complaints.  Provided the patient remains  afebrile and hemodynamically stable, he will be discharged on December 14, 2007.   PHYSICAL EXAMINATION:  VITAL SIGNS:  He had a T-max of 100.1 but later  became afebrile.  His heart rate is mid 90s, up to 115; BP 132/84; O2  sat 95% on room air.  Preop weight 101 kg, today's weight 107.1 kg.  CBGs 144, 172, and 139 respectively.  CARDIOVASCULAR:  Tachycardic.  PULMONARY:  Diminished at bases.  ABDOMEN:  Soft and nontender.  Bowel sounds present throughout.  EXTREMITIES:  Bilateral lower extremity edema, right greater than left.  WOUNDS:  Slight oozing inferiorly from sternal wound.  Right lower  extremity wounds are clean and dry.  Left forearm wound clean and dry.  NEUROLOGIC:  Motor and sensory are intact.   LATEST LABORATORY STUDIES:  BMET done today; potassium 3.8, BUN and  creatinine 21 and 1.4 respectively.  CBC also done this date; H&H down  to 8 and 23.5, white blood cell count 7200, and platelet count 181,000.  Labs will be checked the morning.  Last chest x-ray done on December 11, 2007, showed bibasilar atelectasis, small left effusion, questionable  small right apical pneumothorax.   Discharge instructions include the following:  The patient is not to drive or lift more than 10 pounds.  He is to  continue with his breathing exercise daily.  He is to walk every day and  increase routine duration as tolerated.  He is to remain on carbohydrate  modified medium caloric diet, low on fat and salt.  Wound care; he may  shower.  He is to cleanse his wounds with mild soap and water.  He is to  call the office if any wound problems arise.  Pacing wires will be  removed today.  Chest tube sutures will be removed prior to discharge.  Followup appointments include the patient needs to contact Dr. Henrietta Hoover  office for followup  appointment in 2 weeks.  He will also need to  contact Dr. Sunday Corn office to have a followup appointment with him  in 3 weeks.  Prior to this office appointment, a chest x-ray will be  obtained.   Discharge medications include the following:  1. Enteric-coated aspirin 325 mg p.o. daily.  2. Metformin 850 mg p.o. daily.  3. Crestor 40 mg p.o. nightly.  4. Imdur 30 mg p.o. daily.  5. Lopressor 50 mg p.o. 2 times daily.  6. Lasix 40 mg p.o. daily.  7.  KCl 20 mEq p.o. daily x5 days.  8. Oxycodone 5 mg 1-2 tablets q.4-6 h. as needed for pain.      Doree Fudge, PA      Viviann Spare C. Dorris Fetch, M.D.  Electronically Signed    DZ/MEDQ  D:  12/13/2007  T:  12/14/2007  Job:  [id number]   cc:   Dr. Myrtis Ser

## 2010-06-12 NOTE — Cardiovascular Report (Signed)
NAMENAVEEN, CLARDY             ACCOUNT NO.:  [id number]   MEDICAL RECORD NO.:  [id number]          PATIENT TYPE:  INP   LOCATION:  6533                         FACILITY:  [facility]   PHYSICIAN:  Veverly Fells. Excell Seltzer, MD  DATE OF BIRTH:  01/26/1956   DATE OF PROCEDURE:  08/05/2006  DATE OF DISCHARGE:                            CARDIAC CATHETERIZATION   PROCEDURE:  PTCA and drug-eluting stent placement in the proximal left  circumflex, PTCA and drug-eluting stent placement in the distal left  circumflex, Angio-Seal of the right femoral artery.   INDICATIONS:  Mr. Desha is a 55 year old gentleman with known  coronary artery disease.  He presented with unstable angina.  He  underwent a diagnostic catheterization by [name] yesterday.  The  catheterization demonstrated nonobstructive disease in the LAD, patent  stents in the right coronary artery, and high-grade stenosis involving  the left circumflex.  There is an area of severe stenosis in the  proximal circumflex involving the bifurcation of the first obtuse  marginal branch and a severe stenosis in the distal left circumflex.  I  elected to attempt treatment in these areas with drug-eluting stents in  the setting of multiple lesions and the patient's diabetes.   Risks and indications of the procedure were explained to the patient.  Informed consent was obtained.  The right groin was prepped, draped and  anesthetized with 1% lidocaine.  Using the modified Seldinger technique,  a 7-French sheath was placed in the right femoral artery without  difficulty.  A 7-French GN5 guide catheter was inserted and once a  therapeutic ACT was achieved by using Angiomax, a Cougar guidewire was  taken into the very distal left circumflex into a posterolateral branch.  The distal left circumflex lesion was predilated with a 2.25 x 12-mm  Maverick balloon up to 6 atmospheres on multiple inflations.  The lesion  expanded fairly well.  The proximal  circumflex lesion was also  predilated up to 14 atmospheres with the same balloon.  Following  balloon predilatation, a 2.5 x 16-mm Taxus stent was deployed in the  distal left circumflex at 12 atmospheres.  The stent was well expanded.  There was a nice step-down off the distal end of the stent and a small  step-up into the proximal end of the stent.  I elected not to post  dilate this as the stent appeared well-sized for the vessel.  Attention  was then turned to the proximal circumflex and a 3.0 x 16-mm stent was  placed across the first OM branch to treat the proximal circumflex  lesion.  This was deployed at 14 atmospheres.  I then postdilated the  proximal portion of the stent with a 3.5 x 8-mm Maverick balloon up to  16 atmospheres.  There was some vessel mismatch between the proximal and  distal end of the stent and I elected to use a bigger post-dilatation  balloon in the proximal portion.  A 3.25 x 15-mm Quantum Maverick was  then used to post dilate the remaining portions of the stent and it was  inflated to 16 atmospheres.  The stent was  well expanded.  The OM branch  remained patent.  There is some ostial and proximal stenosis in that  vessel but it does not appear flow limiting.  There was TIMI 3 flow in  the main branch of circumflex as well as the OM.  I elected to stop at  that point.  The right femoral artery was sealed with an 8-French Angio-  Seal.  The patient tolerated the procedure well and had no chest pain at  the conclusion of the procedure.   CONCLUSION:  Successful stenting of serial lesions in the left  circumflex with a 2.5 x 16-mm Taxus stent in the distal circumflex, and  a 3.0 x 16-mm Taxus stent in the proximal circumflex.   Recommend aspirin lifelong and Plavix for a minimum of 1 year.  Will  continue medical therapy for the patient's diffuse coronary artery  disease.      Veverly Fells. Excell Seltzer, MD  Electronically Signed     MDC/MEDQ  D:   08/05/2006  T:  08/06/2006  Job:  [id number]

## 2010-06-12 NOTE — Discharge Summary (Signed)
NAME:  Darrell [name], Darrell [name]                 ACCOUNT NO.:  w   MEDICAL RECORD NO.:  [id number]          PATIENT TYPE:  INP   LOCATION:  6533                         FACILITY:  [facility]   PHYSICIAN:  Jonelle Sidle, MD DATE OF BIRTH:  [birth year]   DATE OF ADMISSION:  08/01/2006  DATE OF DISCHARGE:  08/06/2006                               DISCHARGE SUMMARY   PRIMARY CARDIOLOGIST:  Jonelle Sidle, MD.   PRIMARY CARE Izadora Roehr:  [facility].   PROCEDURES PERFORMED:  1. Cardiac catheterization completed by Dr. [name] on August 04, 2006 secondary to unstable angina.      a.     Left main normal, LAD wrapped at the apex.  There was a mid       long 40-50% stenosis.  There was a long distal 80% stenosis.  The       apical vessel had diffuse moderate disease.  The circumflex       included a ramus intermedius with two Cypher stents.  They were       2.5 mm by report.  There was a long in-stent restenosis of 60-70%.       Following this, there was a mid long 50-60% stenosis.  The first       obtuse marginal was large and branching.  There was a long       proximal 70% stenosis.  There was diffuse proximal and mid 50%       stenosis.  There was an inferior branch with a 60% stenosis.  The       AV groove immediately after the first obtuse marginal had a       somewhat long 80% stenosis.  More distal in the AV groove, there       was a 90% stenosis which was more focal before two posterior       lateral branches.  The right coronary artery was dominant.  It was       calcified throughout its mid segment.  There was a mid 70%       stenosis followed by a 75-80% stenosis.  The PDA had a long 60%       stenosis proximally.  It was a small vessel.  LVEF was found to be       70% with no regional wall motion abnormalities, with the       conclusion:  Severe circumflex disease, particularly the distal AV       groove.  There was more moderate to severe disease elsewhere with  calcified diabetic vessels.      b.     PTCA / DES of the proximal left circumflex with 80% to 0%       with a 3.0 x 16 Taxus with TIMI-3 flow; PTCA / DES, distal       circumflex, 90% to 0%, with a 2.5 x 16 Taxus with TIMI-3 flow.   DISCHARGE DIAGNOSIS:  1. Unstable angina with coronary artery disease; status post cardiac      catheterization on August 04, 2006, with details above.      a.     Status post two drug-eluting stents, Taxus to the left       circumflex, reducing it from 80 to 0%, and to the distal left       circumflex, reducing it from 90% to 0%.  (Please see above       detailed description.)      b.     On September 17, 2001, cardiac catheterization and PCI, RCA was       stented with two 2.5 x 28 mm Cypher drug-eluting stents as well as       a 2.5 x 18 mm Cypher drug-eluting stent.  The ramus intermedius       was stented with a 2.5 x 23 mm Cypher drug-eluting stent.      c.     On September 09, 2002, cardiac catheterization:  Left main       normal, LAD 50%, distal ramus intermedius patent, left circumflex       30% mid, 70% distal, OM-1 60% proximal, LPA 60%, and RCA 30% in-       stent stenosis.  2. Hypertension.  3. Hyperlipidemia.  4. Type 2 diabetes.  5. Gastroesophageal reflux disease.  6. Chronic back pain.  7. Nephrolithiasis.  8. Left elbow fracture in January of 2004.  9. History of tobacco abuse remotely.   HOSPITAL COURSE:  This is a 55 year old Caucasian male with the above-  mentioned history who presented with evidence of unstable angina.  The  patient had had a several-year history of exertional chest pain that was  easily relieved with sublingual nitroglycerin and rest.  He was in his  usual state of health until approximately two days prior to admission  for which he had complained of 9/10 substernal chest pressure and  tightness occurring while cleaning his house with associated shortness  of breath.  The patient was seen in the emergency room, was  given  nitroglycerin and began to feel better.  An EKG did not show any acute  changes, and cardiac markers were negative x one.  The patient did  complain of feeling tightness in his throat and feeling as if there was  something in his throat and he would have to clear his throat a lot.  He  also was complaining of bringing up mucus when he would spit up.   Secondary to the patient's continued complaints of chest discomfort with  known coronary artery disease, the patient was admitted, placed on  heparin and nitrates, and planned for cardiac catheterization.  Cardiac  catheterization was completed on August 04, 2006, revealing severe  circumflex disease, particularly in the distal AV groove, and moderate  to severe disease elsewhere with calcified diabetic vessels.  The  catheterization was completed by [name] and discussion was held  with Dr. Tonny Bollman concerning need for intervention of the  circumflex arteries.  On August 05, 2006, PTCA of the proximal left  circumflex and distal left circumflex was completed with drug-eluting  stents placed per Dr. Tonny Bollman.  The patient recovered well and  had no further complaints of chest discomfort.  Nitroglycerin was  discontinued and the patient was monitored closely for the next 24  hours.  He had no arrhythmias post stent placement, there was no  bleeding or evidence of hematoma, bruit or infection at the right groin  site.  The patient had no further complaints of chest pain or  dyspnea.   On August 06, 2006, the patient was seen and examined by Dr. Simona Huh  and was found to be stable for discharge.  The patient had ambulated,  his nitroglycerin remained discontinued with no recurrence of chest  discomfort.  The patient was counseled on need for medical compliance,  and to follow up with [facility] along with following up with Dr.  Diona Browner in two weeks.  The patient verbalized understanding.   LABORATORY INVESTIGATIONS:  On  discharge, hemoglobin 10.4, hematocrit  29.8, white blood cells 5.0, platelets 171,000.  Sodium 138, potassium  4.2, chloride 106, bicarbonate 26, glucose 143, BUN 11, creatinine 1.23.  Cardiac enzymes:  Troponin 0.05, CK-MB of 1.0, and CK of 37.   Vital signs on discharge:  The patient's blood pressure was 145/71,  heart rate 67, respirations 20, temperature 98.7.  The patient weighed  116.9 kg.   MEDICATIONS ON DISCHARGE:  1. Simvastatin 40 mg at bedtime.  2. Plavix 75 mg daily.  3. Amlodipine 5 mg once per day.  4. Niacin 500 mg once per day.  5. Clonidine 0.1 mg twice per day.  6. Metoprolol 50 mg once per day.  7. Aspirin coated 325 mg once per day.  8. Nitroglycerin sublingual as needed for chest discomfort.  9. Metformin 850 mg twice per day to start on August 07, 2006.   FOLLOW-UP:  1. The patient is to follow up with Dr. Simona Huh on July 29th at      12:00 noon.  2. The patient is to follow up with physicians of [facility] for      continued medical management and diabetic management.  3. The patient has been given post cardiac catheterization      instructions, with particular emphasis on the right groin      site for evidence of bleeding, hematoma or infection.  4. The patient has been counseled on medical compliance.   DICTATION NOTE:  Time spent with the patient to include physician time  45 minutes.      Bettey Mare. Lyman Bishop, NP      Jonelle Sidle, MD  Electronically Signed    KML/MEDQ  D:  08/06/2006  T:  08/06/2006  Job:  [id number]   cc:   Melvern Banker

## 2010-06-12 NOTE — H&P (Signed)
Darrell [name], Darrell [name]             ACCOUNT NO.:  [id number]   MEDICAL RECORD NO.:  [id number]          PATIENT TYPE:  INP   LOCATION:  3707                         FACILITY:  [facility]   PHYSICIAN:  [name], MD, FACCDATE OF BIRTH:  1955-12-05   DATE OF ADMISSION:  08/01/2006  DATE OF DISCHARGE:                              HISTORY & PHYSICAL   PRIMARY CARDIOLOGIST:  Dr. Simona Huh.   PRIMARY Erryn Dickison:  [facility].   PATIENT PROFILE:  A 55 year old married, Caucasian male with a prior  history of CAD who presents with unstable angina.   PROBLEM LIST:  1. Unstable angina/CAD.      a.     On 09/17/2001 cardiac catheterization/PCI.  The RCA was       stented with two 2.5 x 28 mm CYPHER drug-eluting stent as well as       a 2.5 x 18 mm CYPHER drug-eluting stent.  The ramus intermedius       was stented with a 2.5 x 23 mm CYPHER drug-eluting stent.      b.     On 09/09/2002 cardiac catheterization.  Left main normal.       LAD 50% distal.  Ramus intermedius patent.  Left circumflex 30%,       mid 70% distal, OM-1 60% proximal, LPA 60%, RCA 30% in stent.  2. Hypertension.  3. Hyperlipidemia.  4. Type 2 diabetes mellitus.  5. GERD.  6. Chronic back pain.  7. Nephrolithiasis.  8. Left elbow fracture in January 2004.  9. Remote tobacco abuse.   HISTORY OF PRESENT ILLNESS:  A 55 year old married Caucasian male with a  history of CAD who reports a several year history of exertional chest  pain and dyspnea generally relieved with sublingual nitroglycerin and  rest.  He was otherwise in his usual state of health until approximately  2 days ago when he developed 9 out of 10 substernal chest pressure and  tightness that occurred while cleaning his house associated with  shortness of breath, diaphoresis, lasting about 1 hour and relieved with  sublingual nitroglycerin and rest.  Today he was playing video games and  had recurrent 8 out of 10 substernal chest pressure and shortness of  breath not significantly reduced with sublingual nitroglycerin.  Therefore he got on the bus and came into the [facility] ED and now  feels better after receiving additional sublingual nitroglycerin.  ECG  shows no acute changes, and cardiac markers are negative x1.  He also  reports a 1-day history of feeling as though he had something in his  throat, and when he clears his throat, he brings up white mucus which he  then spits up.  He has not had any fevers or chills.  Has not had any  sick contacts.   ALLERGIES:  NO KNOWN DRUG ALLERGIES.   HOME MEDICATIONS:  Clonidine 0.1 mg b.i.d., Toprol XL 50 mg daily,  Caduet 5/10 mg daily, metformin 850 mg b.i.d., aspirin 81 mg daily.  He  was given samples of Avalide 150/12.5 by Dr. Diona Browner but has run out of  these.  FAMILY HISTORY:  Mother died of cancer at age 38.  She did have a  history of MI and CAD.  Father died at age 18 with CAD.  Brother died at  age 43 following end-stage renal disease.   SOCIAL HISTORY:  He lives in [place] with his wife.  He is not  working.  He has a 40-pack-year history of tobacco abuse, quitting in  2003.  Denies any alcohol or drug use.  He has a 52-year-old child.   REVIEW OF SYSTEMS:  Positive for chest pain, shortness of breath,  dyspnea on exertion, and somewhat of a productive cough.  He has  occasional constipation and has a history of diabetes.  All other  systems are reviewed and are negative.   PHYSICAL EXAMINATION:  VITAL SIGNS:  Temperature 97.3, heart rate 64,  respirations 16, blood pressure 121/80, pulse ox 100% on room air.  GENERAL:  A pleasant white male in no acute distress.  Awake, alert,  oriented x3.  HEENT:  Normal.  NEURO:  Grossly intact and nonfocal.  NECK:  No bruits or JVD.  LUNGS:  Respirations regular and labored.  Clear to auscultation.  CARDIAC:  Regular S1, S2.  No S3, S4, or murmurs.  ABDOMEN:  Round, soft, nontender, nondistended.  Bowel sounds present  x4.   EXTREMITIES:  Warm and dry, pink.  No clubbing, cyanosis, or edema.  Dorsalis pedis, posterior tibialis, pedal pulses 2+ and equal  bilaterally.   Chest x-ray shows mild cardiomegaly.  No acute abnormality.  EKG shows  sinus rhythm with a normal axis, rate of 71 beats per minute.  He has a  0.5 mm J-point elevation in lead V6.  Hemoglobin 12.8, hematocrit 37.0,  WBC 5.9, platelets 210.  Sodium 134, potassium 4.4, chloride 99, CO2 30,  BUN 18, creatinine 1.2, glucose 163.  CK MB 1.2, troponin I less than  0.05.  Phosphorus 8.9.  PTT 30, PT 13.6, INR 1.0.   ASSESSMENT AND PLAN:  1. Unstable angina/coronary artery disease.  The patient has had      increased frequency in severity of substernal chest pain along with      chronic dyspnea on exertion and external discomfort.  Last      catheterization was in August 2004.  Plan to admit, serial cardiac      enzymes.  Continue aspirin and statin as well as beta blocker.      Will add heparin and nitrate and plan on catheterization on Monday.  2. Hypertension, stable.  Continue beta blocker, calcium channel      blocker, and clonidine.  3. Hyperlipidemia.  Continue statin.  Check liver function tests and      lipids.  4. Gastroesophageal reflux disease.  Add proton pump inhibitor.  5. Diabetes.  Hold metformin.  Add sliding scale insulin.      [name], ANP      [name], MD, [facility]  Electronically Signed   CB/MEDQ  D:  08/01/2006  T:  08/01/2006  Job:  [id number]

## 2010-06-12 NOTE — Assessment & Plan Note (Signed)
[facility]                            CARDIOLOGY OFFICE NOTE   NAME:Darrell [name]                   MRN:          [id number]  DATE:05/26/2006                            DOB:          1955/05/08    PRIMARY CARE PHYSICIAN:  None.   REASON FOR VISIT:  Followup arranged by emergency department.   HISTORY OF PRESENT ILLNESS:  This is my first meeting with Mr.  Darrell [name]. He was previously followed by Dr. Andee Lineman and was last seen in  our office back in July of 2004. He has a history of coronary artery  disease documented at cardiac catheterization in the past, status post  stent placement to the right coronary artery and ramus intermedius in  August 2003. He also has a history of noncompliance and hypertension. He  states that he has not been on any medications for a long period of time  and was seen in the emergency department recently complaining of  intermittent headache as well as chest pain. He reports being diagnosed  with diabetes and was placed on aspirin, Plavix, and clonidine with  follow up arranged in our office today. In reviewing the records I note  a normal creatinine of 1.1, hemoglobin of 15.3, random glucose of 159,  normal urine drug screen. No cardiac markers obtained. The patient's  electrocardiogram today is normal showing sinus rhythm at 84 beats per  minute. He has Medicaid and states that he should be able to obtain his  medications at this point. I spoke with him today about his underlying  heart disease and the critical importance of regular medication use and  follow up. I also spoke with him about assisting him in arranging a  primary care physician as it will be important for him to have his  glucose monitored and for other health maintainance issues.   ALLERGIES:  No known drug allergies.   PRESENT MEDICATIONS:  1. Aspirin 81 mg p.o. daily.  2. Clonidine 0.1 mg p.o. b.i.d.  3. Plavix 75 mg p.o. daily.   REVIEW OF  SYSTEMS:  As per the history of present illness. He reports  his headache has resolved. He has had no bleeding problems. He has not  had any recent chest pain.   PHYSICAL EXAMINATION:  Blood pressure 150/120, heart rate is 84, weight  is 269 pounds. A disheveled male, in no acute distress, denying active  chest pain.  HEENT: Conjunctivae is normal. Oropharynx is clear.  NECK: Supple. No elevated jugular venous pressure.  Without bruits. No  thyromegaly is noted.  LUNGS: Clear without labored breathing.  CARDIAC EXAM: Reveals a regular rate and rhythm. No loud murmur, S3, or  gallop.  ABDOMEN: Obese, nontender.  EXTREMITIES: Show no pitting edema.   IMPRESSION/RECOMMENDATIONS:  1. Hypertension, not adequately controlled, and complicated by      noncompliance. I discussed the critical importance of regular      medication use and follow up with Mr. Darrell [name]. He does have      Medicaid and should be able to obtain his medications. We will plan      to  arrange a primary care physician for routine follow up and I      gave him prescriptions for Toprol XL 50 mg daily, and Norvasc 5 mg      daily to begin now in lieu of clonidine (he was on that regiment in      the past). We will try to give him samples if available today as      well. He will have a follow up blood pressure check over the next 2      weeks and can plan to continue titration from there.  2. Coronary artery disease as outlined above. Our first step will be      hypertension control and also a baseline lipid profile and liver      function tests as I suspect statin therapy will also be indicated.      He appears to have been taking Zocor in the past. As far as follow      up ischemic evaluation, we can determine this based on his progress      once he gets back on a regular medical regimen and has regular      follow up.     Jonelle Sidle, MD  Electronically Signed    SGM/MedQ  DD: 05/26/2006  DT: 05/26/2006   Job #: [id number]

## 2010-06-12 NOTE — H&P (Signed)
Darrell [name], Darrell [name]             ACCOUNT NO.:  [id number]   MEDICAL RECORD NO.:  [id number]          PATIENT TYPE:  INP   LOCATION:  3734                         FACILITY:  [facility]   PHYSICIAN:  Noralyn Pick. Eden Emms, MD, FACCDATE OF BIRTH:  25-Jun-1955   DATE OF ADMISSION:  12/01/2007  DATE OF DISCHARGE:                              HISTORY & PHYSICAL   CHIEF COMPLAINT:  Chest pain.   HISTORY OF PRESENT ILLNESS:  Darrell [name] is a 55 year old man with  history of coronary artery disease status post multiple coronary stents  came to the ER for chest pain.  It started when he was watching  television this morning.  It lasted for 30 minutes, were sharp 10/10,  got better after he got an aspirin and nitro to 0/10, no radiation.  There was no dyspnea.  He had some nausea, but no vomiting.  No loss of  consciousness, visual symptoms, acid reflux, abdominal pain.  There was  no cough.  The pain got worse and so he and his wife called 911.   ALLERGY HISTORY:  No known drug allergy.   PAST MEDICAL HISTORY:  1. Coronary artery disease status post left circumflex stenting at two      sites, right coronary artery stenting at two sites, ramus      intermedius stenting.  2. Hypertension.  3. Hyperlipidemia.  4. Diabetes type 2.  5. GERD.  6. Chronic back pain.  7. Nephrolithiasis.  8. Remote tobacco abuse.  9. Left elbow fracture in January 2004.  10.Ejection fraction of 70% on cardiac catheterization in July 2008.   SOCIAL HISTORY:  Darrell [name] lives in [place] with his wife and  children.  He is on disability.  He has a smoking history of 40-80 pack  of smoking history.  He does not use alcohol or any other illicit drugs.  He also has learning disability.   FAMILY HISTORY:  His mother had heart attack at unknown age and died  from it.  His father also had heart disease and died from it.   REVIEW OF SYSTEMS:  Negative for fever or chills, but otherwise per HPI.   HOME MEDICATIONS:  1. Metformin 850 mg p.o. b.i.d.  2. Aspirin 325 mg p.o. daily.  3. Benazepril 20 mg p.o. daily.  4. Hydrochlorothiazide 12.5 mg p.o. daily.  5. Toprol-XL 50 mg p.o. daily.  6. Caduet 10/20 - half a pill p.o. daily.  7. Plavix 75 mg p.o. daily.   PHYSICAL EXAMINATION:  VITAL SIGNS:  Temperature 98.3, pulse 100,  respirations 18, blood pressure 175/109, oxygen saturation 90 on 2  liters.  GENERAL:  Not in distress.  EYES:  Anicteric.  PERRLA.  NECK:  Supple without lymph nodes, jugular venous distention.  CARDIOVASCULAR:  First and second heart sound normal.  Mildly  tachycardic, but regular.  No rubs, murmur, or gallops.  LUNGS:  Clear  to auscultation bilaterally without any crackles or wheeze.  ABDOMEN:  Soft, nontender.  No organomegaly.  EXTREMITIES:  Pedal pulses 2+ bilaterally.  No pedal edema.  NEUROLOGIC:  Alert and oriented x3.   Chest  x-ray; borderline cardiomegaly with disposition of mild pulmonary  venous hypertension, airway thickening.  No reflective bronchitis or  reactive airway disease.  EKG is positive for normal sinus rhythm with a  rate of 106.  T-inversion in lead III, S2 depression in lead V3, V4, and  V5.  These are new compared to the last one done in July 2008.   LABORATORY DATA:  Hemoglobin 12.5, WBC 5.1, platelets 172.  Sodium 136,  potassium 4, chloride 103, bicarb 26, BUN 19, creatinine 1.54.  His  baseline creatinine from July 2008 was 1.2-1.23.  Blood sugar 126, total  bilirubin 0.8, alkaline phosphatase 89, AST 27, ALT 32, total protein  6.7, albumin 3.3, calcium 8.7.  D-dimer of 4.12, PTT 31, PT 13.3, and  INR of 1.  Point-of-care cardiac enzymes; CK-MB less than 1, troponin  less than 0.5, myoglobin 90.3.   ASSESSMENT AND PLAN:  Darrell [name] is a 55 year old gentleman with past  medical history of coronary artery disease with multiple coronary  stents, diabetes mellitus, hypertension, hyperlipidemia, and a former  tobacco abuser came in with  chest pain.  1. Chest pain.  Certainly, given his significant past medical history      and current EKG changes, we need to rule out acute coronary      syndrome.  We will cycle cardiac enzymes and EKG, admit him to tele      bed.  We will do a cardiac catheterization in the morning and      depending on the coronary anatomy, further care will be directed.      In the meantime, we will continue aspirin, Plavix, heparin,      Lopressor, and Crestor.  2. Hypertension.  The patient is currently on nitroglycerin drip and      Lopresor.  He was given two doses of labetalol in the ER and we      will tighten his blood pressure.  3. Renal insufficiency.  He has a creatinine of 1.54 and on discharge      last rate was 1.2-1.23.  We will hydrate and hold his metformin and      ACE inhibitor for now.  We will have to closely follow his kidney      function.  4. Hyperlipidemia.  We will check fasting lipid panel and will start      him on Crestor.  5. Diabetes mellitus.  Hold metformin and start him on sliding scale.      Jason Coop, MD  Electronically Signed      Noralyn Pick. Eden Emms, MD, [facility]  Electronically Signed   YP/MEDQ  D:  12/01/2007  T:  12/02/2007  Job:  [id number]

## 2010-06-15 NOTE — Cardiovascular Report (Signed)
   NAME:  Darrell [name], Darrell [name]                       ACCOUNT NO.:  [id number]   MEDICAL RECORD NO.:  [id number]                   PATIENT TYPE:  INP   LOCATION:  6532                                 FACILITY:  [facility]   PHYSICIAN:  [name], M.D. [facility]            DATE OF BIRTH:  [birth year]   DATE OF PROCEDURE:  09/17/2001  DATE OF DISCHARGE:  09/19/2001                              CARDIAC CATHETERIZATION   DATE OF BIRTH:  Sep 09, 1955   CARDIOLOGIST:  Learta Codding, M.D.   PROCEDURE:  Left heart catheterization/coronary arteriography.   INDICATIONS:  Evaluate patient with unstable angina.   DESCRIPTION OF PROCEDURE:  Left heart catheterization was performed via the  left femoral artery. The artery was cannulated using the anterior wall  puncture. A #6 French arterial sheath was inserted via the modified  Seldinger technique. Preformed Judkins and a pigtail catheter were utilized.  The patient tolerated the procedure well and left the lab in stable  condition.   HEMODYNAMICS:  LV 139/3, AO 140/82.   CORONARY ARTERIES:  The left main was normal.   The LAD had proximal luminal irregularities and a mid to distal 40%  stenosis.   The circumflex in the AV groove had mid luminal irregularities. There was a  mid 25% stenosis and a small OM.  There were distal 30% lesions before two  small posterolaterals.   The ramus intermediate was a large vessel.  There was an ostial long 99%  stenosis followed by a long proximal 25% stenosis and a distal 60% stenosis.  OM-1 was a large vessel with ostial 40-50% stenosis.  The right coronary  artery was a dominant vessel. There was proximal long 60% stenosis followed  by a long mid 99% stenosis with TIMI-2 flow in the remainder of the vessel.   LEFT VENTRICULOGRAM: The left ventriculogram was obtained in the RAO  projection. The EF was approximately 65% with mild inferior hypokinesis.   CONCLUSION:  Severe two-vessel coronary artery disease.   Well preserved left  ventricular function.    PLAN:  I will review the films with out interventionist.  I would suggest  multivessel percutaneous intervention and aggressive secondary risk factor  modification.                                                  [name], M.D. [facility]    JH/MEDQ  D:  09/17/2001  T:  09/20/2001  Job:  [id number]   cc:   Learta Codding, M.D. [facility]

## 2010-06-15 NOTE — Discharge Summary (Signed)
[facility]  Patient:    Darrell [name], FERIA Visit Number: [id number] MRN: [id number]          Service Type: OBV Location: 2000 2008 01 Attending Physician:  Learta Codding Dictated by:   Guy Franco, P.A.-C. Admit Date:  08/14/2001 Discharge Date: 08/16/2001                    Referring Physician Discharge Summa  DISCHARGE DIAGNOSES: 1. Chest pain, resolved. 2. Hypertension, treated. 3. Family history of coronary artery disease. 4. History of tobacco abuse. 5. History of chronic back pain. 6. History of gastroesophageal reflux disease. 7. Nephrolithiasis.  HOSPITAL COURSE:  Mr. Darrell [name] was admitted on August 14, 2001, with substernal chest pain.  Part of his work-up included lab studies which revealed CK-MB and troponin levels negative x 3.  Hemoglobin A1c 5.6, C-reactive protein 0.2. Sodium 138, potassium 4.5, BUN 16, creatinine 1.0, BNP normal.  Hemoglobin 13.7, hematocrit 40.2, white count 5.6, platelets 182.  D-dimer 0.22.  His EKG revealed normal sinus rhythm, rate 81 with no acute ST-T wave changes. The patient also underwent a Cardiolite study on August 15, 2001, which revealed an EF of 58% with no evidence of inducible ischemia or scar.  During his hospitalization, the patient was noted to be severely hypertensive. He does have a history of hypertension but was not any medicines prior to admission.  Maximum blood pressure documented was 160/118.  Upon discharge, his blood pressure had decreased to 160/100.  DISCHARGE MEDICATIONS: 1. Enteric-coated aspirin 325 mg q.d. 2. Norvasc 5 mg 1 p.o. q.d. 3. Lopressor 50 mg 1/2 tab p.o. b.i.d. 4. Sublingual nitroglycerin p.r.n. chest pain.  DISCHARGE INSTRUCTIONS: 1. The patient is instructed to follow up in the office within 7-10 days.  We    will call him with this appointment. 2. He is to call for any further questions or concerns. 3. Remain on a low fat diet. 4. No strenuous exercise or  activity until cleared in the office once his    blood pressure is under better control. Dictated by:   Guy Franco, P.A.-C. Attending Physician:  Learta Codding DD:  08/16/01 TD:  08/17/01 Job: [id number]

## 2010-06-15 NOTE — Discharge Summary (Signed)
NAMESEANN, GENTHER                         ACCOUNT NO.:  [id number]   MEDICAL RECORD NO.:  [id number]                   PATIENT TYPE:   LOCATION:                                       FACILITY:   PHYSICIAN:  [id number]                                DATE OF BIRTH:  08-07-1955   DATE OF ADMISSION:  09/15/2001  DATE OF DISCHARGE:  09/20/2001                           DISCHARGE SUMMARY - REFERRING   PROCEDURES:  1. Cardiac catheterization.  2. Coronary arteriogram.  3. Left ventriculogram.  4. PTCA and stent to 2 vessels.   HOSPITAL COURSE:  Mr. Cajamarca is a 55 year old male with no known history  of coronary artery disease but he had been admitted for chest pain from July  18 to August 16, 2001.  At that time, he ruled out and had a negative  Cardiolite.  He returned to the hospital on September 15, 2001, for recurrent  chest pain.  He was admitted to rule out MI and for further evaluation.  His  enzymes were negative for MI, but he was scheduled for cardiac  catheterization.  The cardiac catheterization showed an LAD with a distal  40% stenosis and a circumflex with a mid-25% stenosis and distal 30%  stenosis.  He had a ramus intermedius with an osteal 99% stenosis and a  distal 60% stenosis.  OM 1 had a osteal 40-50% stenosis, and the RCA had a  60% and then a 99% stenosis with TIMI-3 flow.  His EF was 65% with mild  inferior hypokinesis.  It was felt that he needed percutaneous intervention  of the RCA and ramus intermedius.   The patient had percutaneous intervention to the ramus and intermedius and  RCA on September 17, 2001.  He had 2 stents placed to the RCA, reducing most  stenoses from 70% and 95% and then 70% to 0 with TIMI-3 flow.  On the ramus  intermedius, he had 1 stent placed, which reduced that stenosis from 99% to  0 with TIMI-3 flow.  He was placed on Integrilin for 18 hours and Plavix for  6 months.  He was enrolled in the Jumbo study and was given study medication  with followup prescription for Plavix to be obtained as an outpatient.   The patient had a history of tobacco use and was seen by the smoking  cessation consultant.  He was very motivated to continue with the quit  attempt and will be followed up as an outpatient.  Additionally, he had a  case manager consult for his medications, and it was recommended that he  follow up at [facility] for his primary care, and a Medicaid override form  was given to assist him in getting his medications.  Additionally, he was  given nutrition information for low-fat diet, and cardiac rehab saw the  patient as  well.  By September 19, 2001, his cath site was stable, and he was  ambulating without difficulty.  He had been hyperglycemic at first during  his admission, but his hemoglobin A1c was checked, and it was within normal  limits.  He was considered stable for discharge on September 19, 2001.   LABORATORY VALUES:  Hemoglobin 12.7.  Hematocrit 37.4.  WBCs 8.3.  Platelets  153.  Sodium 136.  Potassium 4.6.  Chloride 104.  CO2 26.  BUN 14.  Creatinine 1.0.  Glucose 113, fasting.  Hemoglobin A1c 6%.  Urinalysis  negative.   DISCHARGE CONDITION:  Improved.   DISCHARGE DIAGNOSES:  1. Unstable anginal pain, status post PTCA and stent x2 to the RCA and stent     to the ramus intermedius, this admission.  2. Hypertension.  3. Family history of premature coronary artery disease.  4. History of tobacco use.  5. Gastroesophageal reflux disease.  6. History of nephrolithiasis.  7. Chronic back pain.   DISCHARGE INSTRUCTIONS:  1. His activity level is to include no driving, sexual, or strenuous     activity for 2 days.  He is to call the office for problems with the cath     site.  He is to stick to a low-fat diet.  He is to follow up with Dr.     Andee Lineman.  He is to call [facility] for primary care followup.   DISCHARGE MEDICATIONS:  1. Jumbo study drug, 1 row every day.  2. Lopressor 50 mg b.i.d.  3. Norvasc  10 mg q.d.  4. Nitroglycerin p.r.n.  5. Coated aspirin 325 mg q.d.     Lavella Hammock, PA [facility]                    [id number]    RG/MEDQ  D:  09/19/2001  T:  09/21/2001  Job:  [id number]   cc:   Learta Codding, M.D. [facility]

## 2010-06-15 NOTE — H&P (Signed)
NAME:  Darrell [name], Darrell [name]                       ACCOUNT NO.:  [id number]   MEDICAL RECORD NO.:  [id number]                   PATIENT TYPE:  EMS   LOCATION:  MINO                                 FACILITY:  [facility]   PHYSICIAN:  Learta Codding, M.D. [facility]             DATE OF BIRTH:  Jul 27, 1955   DATE OF ADMISSION:  09/15/2001  DATE OF DISCHARGE:                                HISTORY & PHYSICAL   HISTORY OF PRESENT ILLNESS:  The patient is a 55 year old white male with a  metabolic syndrome, but no prior history of coronary artery disease.  The  patient was admitted on August 14, 2001 with substernal chest pain,  predominantly postprandial.  Although the patient is at high-risk features  for coronary artery disease, he had somewhat of an atypical presentation.  A  __________ assessment at that time revealed negative CRP, negative troponins  and negative BMP.  The patient was found to be markedly hypertensive during  that admission, and was started on Lopressor and Norvasc.  Ultimately he  underwent adenosine Cardiolite study, which did not demonstrate ischemia and  with normal ejection fraction. The patient had a follow-up appointment  arranged with Dr. Andee Lineman on August 28, 2001 but missed his appointment.  Over  the last two weeks he has developed increased substernal chest pain, now on  exertion.  He states when he went to pick up his child from school he  experienced severe substernal chest pain, with some radiation to the left  shoulder.  He has taken several nitroglycerins over the last two weeks, with  relief in five to ten minutes of his substernal chest pain.  He also has  shortness of breath on exertion.  He denies, however, orthopnea and PND.  The patient presented ultimately to the emergency room tonight, due to  recurrent chest pain.  His 12-lead electrocardiogram does not demonstrate  new Q-waves or ischemia pattern.  He is currently comfortable and has been  started on intravenous  nitroglycerin.   PAST MEDICAL HISTORY:  1. History of chronic back pain.  2. History of GERD.  3. Kidney stones.  4. History of hypertension.  5. Metabolic syndrome.  6. History of glucose intolerance.  Reportedly elevated blood sugars during     the last admission, but not started on hypoglycemic therapy.   ALLERGIES:  No known drug allergies.   FAMILY HISTORY:  Father died at age 48 of heart disease.  Mother had a  myocardial infarction at age 41.   SOCIAL HISTORY:  The patient smokes one pack of cigarettes/day since the age  of 31.  Occasionally drinks alcohol.  He is married and has two children;  lives in [place].  He is unemployed.  He has an 11th grade education.   MEDICATIONS:  1. Enteric-coated aspirin 325 mg p.o. q.d.  2. Norvasc 5 mg p.o. q.d.  3. Lopressor 25 mg p.o. b.i.d.  4. Sublingual  nitroglycerin p.r.n.   REVIEW OF SYSTEMS:  As outlined above, shortness of breath with increased  exercise.  Some occasional cough, particularly in the morning.  No history  of palpitations or syncope.  No abdominal pain, no nausea or vomiting.   PHYSICAL EXAMINATION:  VITAL SIGNS:  Blood pressure 178/100, heart rate 91  beats per minute, saturation 98% on room air.  GENERAL:  A 55 year old white male, in no apparent distress.  HEENT:  No JVD, carotid bruits.  Normal carotid upstroke.  Neck supple.  HEART:  Regular rate and rhythm, but no murmurs.  Normal S1, S2.  LUNGS:  Clear breath sounds bilaterally.  SKIN:  Warm and dry.  ABDOMEN:  Soft and nontender; no rebound or guarding.  Good bowel sounds.  GU/RECTAL:  Deferred.  EXTREMITIES:  Good peripheral pulses.  No bruits.  No edema.  NEUROLOGIC:  Alert and oriented x3.  The patient is grossly nonfocal.   CHEST X-RAY:  Pending.   EKG:  Normal sinus rhythm, heart rate 82 beats per minute; inverted T-waves  but nonspecific T-wave changes, poor R-wave progression.   IMPRESSION:  1. Substernal chest pain.  The patient has  exertional chest pain typical of     angina.  He has taken several nitroglycerins over the last two weeks.  He     has multiple cardiac risk factors.  We will proceed with diagnostic     catheterization in the a.m.   I have explained the risks and benefits of this procedure to the patient; he  is willing to proceed.  In the interim he will be started on aspirin and  Heparin, as well as intravenous nitroglycerin; in addition to Norvasc and  Lopressor for his hypertension.  1. Hypertension.  Still poorly controlled.  The patient states that he has     been compliant.  He may need further up titrate on his medications.  2. History of infiltrate on chest x-ray during last admission.  Follow-up     chest x-ray is required.  3. Metabolic syndrome.  Hemoglobin A1c was within normal limits, but fasting     blood sugar needs to be reviewed in the morning.  Do suspect the patient     has glucose intolerance/hyperinsulinemia and may need to be started on an     oral hypoglycemic medication.  If he needs this, the obvious choice would     be Glucophage; to be started two days after his diagnostic     catheterization.   DISPOSITION:  The patient has been scheduled for catheterization in the  morning.  The risks and benefits have been explained and he is willing to  proceed.                                               Learta Codding, M.D. [facility]    GED/MEDQ  D:  09/15/2001  T:  09/15/2001  Job:  [id number]   cc:   [facility]

## 2010-06-15 NOTE — Cardiovascular Report (Signed)
NAME:  Darrell [name], Darrell [name]                       ACCOUNT NO.:  [id number]   MEDICAL RECORD NO.:  [id number]                   PATIENT TYPE:  INP   LOCATION:  4742                                 FACILITY:  [facility]   PHYSICIAN:  Salvadore Farber, M.D.             DATE OF BIRTH:  1955-12-19   DATE OF PROCEDURE:  09/09/2002  DATE OF DISCHARGE:                              CARDIAC CATHETERIZATION   PROCEDURE:  Left heart catheterization, left ventriculography, abdominal  aortography, coronary angiography.   INDICATIONS:  Mr. Bernhart is a 55 year old gentleman with hypertension,  diabetes mellitus, and dyslipidemia.  He has known coronary disease status  post stenting of the RCA and ramus intermedius in August of last year.  He  now presents with chest pain occurring with activities such as carrying  heavy boxes.  He has had no rest discomfort.  He is referred for diagnostic  angiography.   PROCEDURAL TECHNIQUE:  Informed consent was obtained.  Under 1% lidocaine  local anesthesia a 6-French sheath was placed in the right femoral artery  using the modified Seldinger technique.  Diagnostic angiography and  ventriculography were performed using JL4, JR4, and pigtail catheters.  The  pigtail catheter was then pulled back to the abdominal aorta and positioned  in the suprarenal abdominal aorta.  Abdominal aortography without runoff was  performed using power injection.  Catheters were then removed.  The patient  tolerated the procedure well and was transferred to the holding room in  stable condition.  Sheaths are to be removed there.   COMPLICATIONS:  None.   FINDINGS:  1. LV 141/9/19.  EF 65% without regional wall motion abnormality.  2. No aortic stenosis or mitral regurgitation.  3. Left main:  Angiographically normal.  4. LAD:  The LAD is a moderate sized vessel giving rise to no substantive     diagonal branches.  There is a 50% stenosis of the apical LAD.  5. Ramus intermedius:   Large vessel.  There is a widely patent stent in the     proximal portion of the vessel.  There is a 60% stenosis of the distal     vessel.  6. Circumflex:  The circumflex is a large, codominant vessel.  It gives rise     to three obtuse marginals and the PDA.  The first obtuse marginal has a     proximal 60% lesion which is unchanged from prior.  The mid circumflex     has a 30% lesion after the takeoff of the second obtuse marginal.  The     distal circumflex has a 70% stenosis crossing the takeoff of the third     marginal and extending into the proximal portion of the left PDA.  The     left PDA has 60% stenosis.  7. RCA:  The RCA is a relatively small, codominant vessel.  The stents in     the mid vessel  are patent.  There is a focal area of approximately 30% in-     stent restenosis.  8. Widely patent single renal arteries bilaterally.  Abdominal aorta is     normal.   IMPRESSION/RECOMMENDATIONS:  His angina is likely due to progression of the  distal circumflex lesion which is now approximately 70%.  There is a  substantial size discrepancy from the proximal to the distal vessel which  persists after the injection of intracoronary nitroglycerin.  The vessel  drops from 3.0 to approximately 2.25  mm.  Treatment would require jailing of the moderate sized third obtuse  marginal.  Given his markedly elevated blood pressure, recommend initial  trial of medical therapy.  Percutaneous intervention would be reasonable  should he fail aggressive medical therapy.                                               Salvadore Farber, M.D.    WED/MEDQ  D:  09/09/2002  T:  09/09/2002  Job:  [id number]

## 2010-06-15 NOTE — H&P (Signed)
NAME:  Darrell [name], Darrell [name]                       ACCOUNT NO.:  [id number]   MEDICAL RECORD NO.:  [id number]                   PATIENT TYPE:  INP   LOCATION:  4742                                 FACILITY:  [facility]   PHYSICIAN:  Learta Codding, M.D.                 DATE OF BIRTH:  Jun 06, 1955   DATE OF ADMISSION:  09/07/2002  DATE OF DISCHARGE:                                HISTORY & PHYSICAL   CARDIOLOGIST:  Learta Codding, M.D.   PRIMARY CARE PHYSICIAN:  The patient does not have a primary physician  assigned.   CURRENT COMPLAINTS:  Admitted for cardiac catheterization, which patient did  not show up for on August 30, 2002.   HISTORY OF PRESENT ILLNESS:  The patient is a 55 year old male with a  history of known coronary artery disease.  He is status post stent placement  in the proximal mid RCA and stenting of the ostium of the ramus intermedius  on  September 17, 2001 by Dr. Samule Ohm.  Catheterization performed on the same  day showed a normal left main and LAD with proxima; luminal irregularities  and distal 40% stenosis.  The circumflex has mild luminal irregularity with  25% mid stenosis as well there was a 30% distal stenosis before a small  posterolateral and the ramus intermedius and the ostium with 99% stenosis,  and a long proximal 25% stenosis, and distal 60% at the first obtuse  marginal branch, and 40-50% stenosis in the RCA at the proximal region, with  the mid long 99% with TIMI II flow.  His LVEF was 65%.  The patient  subsequently underwent stent placement to the ramus intermedius in the mid  and distal RCA.  The patient has a history of hypertension, dyslipidemia and  a metabolic syndrome.  The patient recently ran out of his Norvasc, hence he  has not been taking it.   On the evening of July 28th he was carrying a heavy box and developed  substernal chest burning.  He sat down, rest and took a nitroglycerin for  relief.  He then got up and tried to exert himself, and  developed recurrent  pain and there was some shortness of breath associated with this.  There was  some queasiness in his stomach as well as diaphoresis.  Denies any radiating  symptoms, orthopnea or paroxysmal nocturnal dyspnea.  He did have some  dizziness.  He came to the office on August 26, 2002 when he presented with  these symptoms and his blood pressure was 160/120.  That day decision was  made to proceed with cardiac catheterization on August 30, 2002, but the  patient never showed up for his appointment.  He now presents to the  emergency room stating that he wants to proceed with his cardiac  catheterization due to ongoing pain.  The patient's wife is going to have  another baby soon and he  says I do not want to have a heart attack before  my daughter is born.  The patient is currently pain-free and reports no  shortness of breath, orthopnea or PND.   PAST MEDICAL HISTORY:  1. History of coronary artery disease.  2. Hypertension.  3. Chronic back pain.  4. GERD.  5. Nephrolithiasis.  6. Dyslipidemia.  7. Metabolic syndrome.  8. Status post left elbow fracture, January 2004.   SOCIAL HISTORY:  The patient lives in [place] with his wife of 17 years.  They have two children.  His wife is currently pregnant.  He is disabled.   MEDICATIONS:  1. Norvasc 5 mg p.o. daily.  2. Lopressor 50 mg p.o. b.i.d.  3. Zocor 20 mg p.o. daily.  4. Plavix 75 mg p.o. daily.  5. Enteric coated aspirin 81 mg p.o. daily.   FAMILY HISTORY:  Mother died at age 44 status post coronary bypass grafting.  Father died at age 19 with myocardial infarction.  One brother had a  coronary intervention and one brother died of end-stage renal disease.   REVIEW OF SYSTEMS:  Review of systems is positive for chest pain earlier  today, but currently none.  No shortness of breath or dyspnea on exertion.  No headache, frequency or dysuria.  No weakness or numbness.  No myalgias or  arthralgias.  No nausea or  vomiting.  No polyuria or polydipsia.   PHYSICAL EXAMINATION:  VITAL SIGNS:  Blood pressure 176/102, heart rate is  96 beats per minute, respirations 22 and saturation 97% on room air.  GENERAL:  Obese white male in no apparent distress.  HEENT: Pupils are sunken.  NECK:  Supple.  Normal carotid upstroke.  No carotid bruits.  LUNGS: Clear.  HEART: Regular rate and rhythm.  No murmurs, rubs or gallops.  SKIN: No rash or lesions.  ABDOMEN: Soft and nontender.  GENITOURINARY AND RECTAL:  Deferred.  EXTREMITIES:  No cyanosis, clubbing or edema.  NEUROLOGIC: Alert and oriented.  Nonfocal.   LABORATORY DATA:  Chest x-ray is pending.  EKG; heart rate is 104 beats per  minute and sinus tachycardia.  Normal sinus rhythm, nonspecific T wave  change, otherwise there are no acute changes.  Outside labs:  Hemoglobin is  24 and hematocrit 41.  BUN 12 and creatinine 1.1, potassium 4.2, and enzymes  are negative times two.   IMPRESSION:  1. Substernal chest pain, somewhat atypical, but the patient had been     scheduled for an elective cardiac catheterization.  He certainly has     multiple risk factor and he is at high risk for restenosis.   The plan is to proceed with cardiac catheterization in the morning.   1. Dyslipidemia.  2. Metabolic syndrome.   The patient is being treated aggressively with lipid lowering therapy; and,  we would suggest Lipitor 80 mg p.o. q.h.s.   1. Hypertension, currently uncontrolled.   Increase Norvasc to 10 mg p.o. daily and increase Lopressor.   1.     Gastroesophageal reflux disease.  2. Chronic back pain.   DISPOSITION:  The patient will proceed with the cardiac catheterization in  the morning.                                                  Learta Codding, M.D.    GED/MEDQ  D:  09/07/2002  T:  09/08/2002  Job:  [id number]

## 2010-06-15 NOTE — Discharge Summary (Signed)
NAME:  Darrell [name], Darrell [name]                       ACCOUNT NO.:  [id number]   MEDICAL RECORD NO.:  [id number]                   PATIENT TYPE:  INP   LOCATION:  4742                                 FACILITY:  [facility]   PHYSICIAN:  Learta Codding, M.D.                 DATE OF BIRTH:  1955/09/20   DATE OF ADMISSION:  09/07/2002  DATE OF DISCHARGE:  09/09/2002                           DISCHARGE SUMMARY - REFERRING   PROCEDURE:  1. Cardiac catheterization.  2. Coronary arteriogram.  3. Left ventriculogram.   HOSPITAL COURSE:  Darrell [name] is a 55 year old male with a history of  coronary artery disease. He had a stent to his ostial ramus intermedius and  a stent to the mid distal RCA. He was due to for repeat left heart  catheterization on August 2 and missed his appointment. On the day of  admission, he came in with chest pain and dizziness. He was admitted for  further evaluation and treatment.   His enzymes were negative for MI, and a cardiac catheterization was  performed on September 09, 2002. The cardiac catheterization showed a normal  left main and a LAD with 50% distal stenosis. There was other noncritical  disease, but upon evaluating the films, Dr. Samule Ohm felt that the angina was  likely due to progression of the left PDA lesion. This was now 70%. This is  a 2.25-mm vessel and involves the side branch. Medical therapy is therefore  recommended. His EF was 65% without regional wall motion abnormalities.   Darrell [name] states that he has diabetes. He has no primary care physician  and is not currently being treated for this. His CBGs during his hospital  stay ranged between 104 and 134. It was therefore felt at this time that a  home Glucometer for outpatient CBG testing and obtaining a primary care  physician. The patient stated that he would follow up with the [facility]  outpatient clinic. At this time, he is not being placed on any medications.  Once he obtains a primary care  physician, hemoglobin A1c can be checked, and  he can be placed on appropriate medications depending on his CBGs.   As part of his evaluation, a lipid profile was performed. The lipid profile  showed total cholesterol of 139, triglycerides 98, HDL 28, LDL 91. In order  to bring up his LDL, his Zocor was increased from 20 to 40 mg q.d.   For better blood pressure control, his beta blocker was changed to atenolol  100 mg q.d. Additionally, his Norvasc was increased from 5 to 10 mg q.d. He  is to continue on Plavix and aspirin daily and nitroglycerin p.r.n. Mr.  [name] was ambulating post catheterization without chest pain or shortness  of breath, and his groin was without ecchymosis or hematoma. He was  considered for stable for discharge on September 09, 2002.   LABORATORY DATA:  Hemoglobin 13.7, hematocrit  39.6, WBCs 7.2, platelets 199.  Sodium 138, potassium 3.8, chloride 106, CO2 28, BUN 10, creatinine 0.9,  glucose 113. Other CMET values within normal limits except for albumin  slightly low at 3.3.   Chest x-ray:  Cardiomegaly with no active disease.   CT of the head:  No evidence of intracranial hemorrhage, brain edema, or  mass effect. The ventricles are normal, and no extra-axial abnormalities are  identified. Bony windows showed no significant abnormality.   DISCHARGE CONDITION:  Improved.   DISCHARGE DIAGNOSES:  1. Chest pain, felt secondary to progression of PDA disease with medical     therapy recommended.  2. Status post percutaneous transluminal coronary angioplasty and stent to     the ostial ramus intermedius in the mid distal RCA in 2003.  3. Hypertension.  4. Chronic low back pain.  5. Gastroesophageal reflux disease symptoms.  6. History of nephrolithiasis.  7. Dyslipidemia.  8. Diabetes per the patient.  9. Status post left elbow fracture in January 2004.  10.      Nephrolithiasis.  11.      Strong family history of premature coronary artery disease.    DISCHARGE INSTRUCTIONS:  Activity level was to include no driving, sexual,  or strenuous activity for two days. He is advised to keep his  catheterization site clean and dry and bathe it daily and dry it carefully.  He is to stick to a low fat diabetic diet and check his glucose twice a day.  He is to call the office with problems with the catheterization site. He is  to obtain an appointment at the [facility] outpatient clinic for primary  care. He is to follow up with Dr. Andee Lineman on August 30 at 9:30 a.m.   DISCHARGE MEDICATIONS:  1. Atenolol 100 mg q.d.  2. Zocor 40 mg q.d.  3. Norvasc 10 mg q.d.  4. Aspirin 325 mg q.d.  5. Plavix 75 mg q.d.  6. Nitroglycerin 0.4 mg p.r.n.      Lavella Hammock, P.A. [facility]                  Learta Codding, M.D.    RG/MEDQ  D:  09/09/2002  T:  09/10/2002  Job:  [id number]

## 2010-06-15 NOTE — Cardiovascular Report (Signed)
NAME:  Darrell [name], Darrell [name]                       ACCOUNT NO.:  [id number]   MEDICAL RECORD NO.:  [id number]                   PATIENT TYPE:  INP   LOCATION:  6532                                 FACILITY:  [facility]   PHYSICIAN:  Salvadore Farber, MD [facility]            DATE OF BIRTH:  [birth year]   DATE OF PROCEDURE:  09/17/2001  DATE OF DISCHARGE:  09/19/2001                              CARDIAC CATHETERIZATION   PROCEDURE:  Stent to proximal and mid right coronary artery, stent to ramus  intermedius ostium.   INDICATIONS FOR PROCEDURE:  The patient is a 55 year old gentleman with  newly diagnosed diabetes mellitus who presents with unstable angina.  Diagnostic angiography this morning by [name] demonstrated an 80%  stenosis of the ramus intermedius and a long 95% stenosis of the mid right  coronary artery with TIMI-2 flow.  He is therefore referred for percutaneous  intervention.   TECHNIQUE:  Using sterile technique, the #6 French sheath was exchanged over  a wire to a #7 [place] sheath.  Anticoagulation was initiated with Integrilin  and heparin.  The patient was randomized in the JUMBO trial of oral platelet  antagonists.  He received the study drug prior to the procedure.  A JL4  guide was advanced over wire and engaged in the ostium of the right coronary  artery.  A BMW wire was passed to the distal vessel.  The mid right coronary  lesion was predilated with a 2.0 x 20-mm Maverick two times at 10  atmospheres.  A 2.5 x 28-mm CYPHER stent was then deployed in the distal  portion of the long severe stenosis.  It was initially deployed at 11  atmospheres.  A second 2.5 x 28-mm CYPHER was then deployed more proximally  so as to overlap the more distal stent by approximately 3 mm.  A 2.5 x 12-mm  Quantum balloon was then used to post dilate the distal edge of the stent.  A 2.75 x 20-mm Quantum balloon was then used to post dilate the remainder of  the stent at 16 atmospheres at the  overlap zone and more proximally.  Establishment of good flow in this proximal vessel revealed a significant  stenosis approximately 20 mm downstream from the distal edge of the stent.  This comprised approximately 70% stenosis.  A 2.5 x 18-mm CYPHER was  positioned across this lesion and deployed at 12 atmospheres.  This stent  was then post dilated with a 2.5 x 12-mm Quantum balloon at 12 atmospheres  using two inflations to cover the entirety of the stent.  I then proceeded  to post dilate the first two stents using a 3.0 x 20-mm Quantum, staying  free of the distal edge of the stent but covering the remainder.  It was  inflated to 16 atmospheres.  Final angiograms demonstrated no residual  stenosis in either of the lesions and TIMI-3 flow.   I then proceeded  to intervene upon the ramus intermedius ostial lesion.  An  EBU 3.5 guide was engaged in the left main ostium.  A BMW wire was advanced  across the lesion into the distal ramus intermedius.  A 2.5 x 12-mm Quantum  balloon was used to predilate at 12 atmospheres.  A 2.5 x 23-mm CYPHER was  then positioned across the lesion and deployed at 14 atmospheres.  The stent  was then post dilated with a 2.75 x 20-mm Quantum at 14 atmospheres using  two inflations to cover the entirety of the stent.  Final angiograms  demonstrated no residual stenosis and TIMI-3 flow.  There was no plaque  shift into either the left main or the LAD.   IMPRESSION/PLAN:  Successful stenting of the right coronary artery and ramus  intermedius using CYPHER stents.  Will continue Integrilin for 18 hours.  Plavix will be continued for six months given his unstable  presentation.  Aspirin will be continued indefinitely.  Given the large dye  loads with this lengthy procedure, he will be hydrated aggressively.  His  creatinine will be watched closely.  Should his creatinine remain stable,  will anticipate discharge tomorrow.                                                 Salvadore Farber, MD [facility]    WED/MEDQ  D:  09/17/2001  T:  09/20/2001  Job:  [id number]

## 2010-06-15 NOTE — H&P (Signed)
[place]. [facility]  Patient:    Darrell [name], Darrell [name] Visit Number: [id number] MRN: [id number]          Service Type: OBV Location: 2000 2008 01 Attending Physician:  Learta Codding Dictated by:   Lewayne Bunting, M.D. [facility] Admit Date:  08/14/2001                           History and Physical  REFERRING PHYSICIAN:  None, unassigned.  CURRENT COMPLAINT:  Substernal chest pain.  HISTORY OF PRESENT ILLNESS:  The patient is a 55 year old white male with a metabolic syndrome as well as additional risk factors for coronary artery disease including hypertension and family history of CAD.  The patient reported the new onset of chest pain with the first episode yesterday when he ate a cheeseburger around noontime.  He developed chest pain two hours after eating his meal with heavy substernal chest pressure that worsened on exertion.  This was also associated with some shortness of breath and diaphoresis.  The pain approximately lasted for half an hour and then ultimately resolved.  He did report some numbness and tingling in the left hand.  Also later on when working on his car he experienced another episode of substernal chest pain.  Ultimately there was another episode today after he ate a pizza which was associated with associated symptoms as outlined above including some nausea, diaphoresis and shortness of breath.  He reports that this pain lasted somewhat longer.  EMS was called and the patient was given sublingual nitroglycerin with resolution of his symptoms.  He denies recent exertional chest pressure prior to the above-mentioned episodes and he denies any palpitations or syncope.  PAST MEDICAL HISTORY: 1. History of chronic back pain. 2. History of GERD at age 74 but none since. 3. History of kidney stones. 4. History of hypertension.  ALLERGIES:  No known drug allergies.  FAMILY HISTORY:  Heart disease in father who died at age 53.  Cancer and  heart disease in mother who had a myocardial infarction at age 20.  SOCIAL HISTORY:  The patient smokes one pack of cigarettes a day since he was 55 years of age, he occasionally drinks alcohol.  He is married with two children, lives in [place].  He is unemployed.  He has an Brewing technologist.  MEDICATIONS:  None.  REVIEW OF SYSTEMS:  Shortness of breath and decreased exercise tolerance, occasional cough particularly in the morning.  No history of palpitations or syncope.  No abdominal pain.  No nausea or vomiting.  Numbness and tingling in the left hand as outlined above.  PHYSICAL EXAMINATION:  VITAL SIGNS:  Blood pressure 176/101, temperature 97.2, heart rate 94 beats per minute, respirations 22, saturation 97% on room air.  GENERAL:  An overweight white male in no apparent distress.  HEENT:  Pupils equal, round and reactive to light.  Conjunctivae clear.  NECK:  Supple, normal carotid upstroke, no carotid bruits.  LUNGS:  Clear.  HEART:  Regular rate and rhythm, normal S1 and S2.  No murmur, rub and no gallops.  ABDOMEN:  Soft, nontender.  EXTREMITIES:  Peripheral pulses 2+.  No cyanosis, clubbing or edema.  NEUROLOGIC:  The patient is alert and oriented and grossly nonfocal.  DIAGNOSTIC STUDIES:  Chest x-ray: Left base opacity, atelectasis versus infiltrate; followup chest x-ray needed.  EKG: Heart rate 97 beats per minute, normal axis, no Q waves and no acute ischemic changes.  CK 28, MB is 1.2, troponin is 0.01.  PT is 13.1, INR is 1.0, PTT 28.  Sodium is 138, potassium 4.5, BUN is 16, creatinine is 1.0, glucose 218, AST is 29, ALT 28.  White count 5.7, hemoglobin is 13.7, platelet count 182.  IMPRESSION: 1. Substernal chest pain.  The patient does have high-risk features for    significant coronary artery disease.  He does appear to have the metabolic    syndrome and may also have diabetes mellitus.  Hemoglobin A1c is pending.    However, there are some  atypical features to his chest pain syndrome.  A    multimarker assessment is indicated with serial troponins, CRP and BNP.  If    abnormal, a low threshold should be applied to proceed with cardiac    catheterization.  The patient has been scheduled for an exercise Cardiolite    test in the morning if his markers are within normal limits.  The patient    will need to adhere to significant risk factor modification. 2. Hypertension, uncontrolled.  The patient has been placed on beta-blocker    therapy, but also may require the addition of an ACE inhibitor particularly    if he has diabetes and microalbuminuria. 3. Infiltrate on chest x-ray.  Followup is required.  In the a.m. a chest    x-ray PA and lateral has been ordered as the film in the emergency room    was a portable film. 4. Metabolic syndrome.  A lipid panel is pending.  Aggressive risk factor    modification in order of diet, exercise and weight loss and this has been    relayed to the patient.  Statin therapy may also be indicated. Dictated by:   Lewayne Bunting, M.D. [facility] Attending Physician:  Learta Codding DD:  08/14/01 TD:  08/15/01 Job: [id number]

## 2010-10-30 LAB — COMPREHENSIVE METABOLIC PANEL
ALT: 32
Albumin: 2.8 — ABNORMAL LOW
Alkaline Phosphatase: 89
BUN: 15
Chloride: 102
Chloride: 103
Creatinine, Ser: 1.22
Potassium: 4
Sodium: 136
Total Bilirubin: 0.6
Total Protein: 6.4
Total Protein: 6.7

## 2010-10-30 LAB — CBC
HCT: 23.5 — ABNORMAL LOW
HCT: 23.6 — ABNORMAL LOW
HCT: 25.2 — ABNORMAL LOW
HCT: 28.3 — ABNORMAL LOW
HCT: 29.1 — ABNORMAL LOW
HCT: 29.7 — ABNORMAL LOW
HCT: 30.4 — ABNORMAL LOW
HCT: 31.7 — ABNORMAL LOW
HCT: 36.7 — ABNORMAL LOW
Hemoglobin: 10.7 — ABNORMAL LOW
Hemoglobin: 10.8 — ABNORMAL LOW
Hemoglobin: 12 — ABNORMAL LOW
Hemoglobin: 8 — ABNORMAL LOW
Hemoglobin: 8.3 — ABNORMAL LOW
MCHC: 33.6
MCHC: 34
MCHC: 34.2
MCHC: 34.3
MCHC: 34.4
MCHC: 34.5
MCHC: 34.7
MCHC: 34.7
MCHC: 34.7
MCHC: 35.1
MCHC: 35.2
MCV: 84.2
MCV: 85.4
MCV: 85.4
MCV: 85.4
MCV: 85.8
MCV: 85.8
MCV: 86.2
MCV: 86.4
MCV: 86.6
MCV: 86.7
MCV: 86.9
Platelets: 146 — ABNORMAL LOW
Platelets: 147 — ABNORMAL LOW
Platelets: 158
Platelets: 164
Platelets: 166
Platelets: 167
Platelets: 171
Platelets: 172
Platelets: 182
Platelets: 342
RBC: 2.7 — ABNORMAL LOW
RBC: 3.37 — ABNORMAL LOW
RBC: 3.61 — ABNORMAL LOW
RBC: 3.61 — ABNORMAL LOW
RBC: 3.61 — ABNORMAL LOW
RBC: 3.64 — ABNORMAL LOW
RBC: 3.66 — ABNORMAL LOW
RBC: 3.7 — ABNORMAL LOW
RBC: 3.94 — ABNORMAL LOW
RBC: 4.3
RDW: 14.8
RDW: 14.8
RDW: 14.9
RDW: 14.9
RDW: 15
RDW: 15.1
RDW: 15.4
RDW: 15.5
RDW: 15.9 — ABNORMAL HIGH
WBC: 11 — ABNORMAL HIGH
WBC: 12.1 — ABNORMAL HIGH
WBC: 4.9
WBC: 5.1
WBC: 5.2
WBC: 5.4
WBC: 5.7
WBC: 6.3
WBC: 7.2

## 2010-10-30 LAB — DIFFERENTIAL
Basophils Absolute: 0
Basophils Absolute: 0
Basophils Relative: 0
Eosinophils Absolute: 0.1
Eosinophils Relative: 1
Lymphocytes Relative: 20
Lymphs Abs: 1.2
Lymphs Abs: 1.6
Monocytes Absolute: 0.6
Monocytes Relative: 9
Neutro Abs: 4.4

## 2010-10-30 LAB — CK TOTAL AND CKMB (NOT AT ARMC)
Relative Index: INVALID
Relative Index: INVALID

## 2010-10-30 LAB — BASIC METABOLIC PANEL
BUN: 14
BUN: 15
BUN: 15
BUN: 16
BUN: 16
BUN: 16
BUN: 21
BUN: 25 — ABNORMAL HIGH
CO2: 23
CO2: 24
CO2: 24
CO2: 25
CO2: 26
CO2: 26
CO2: 26
Calcium: 8.2 — ABNORMAL LOW
Calcium: 8.3 — ABNORMAL LOW
Calcium: 8.6
Calcium: 8.6
Calcium: 8.8
Chloride: 102
Chloride: 104
Chloride: 104
Chloride: 104
Chloride: 105
Chloride: 105
Chloride: 107
Creatinine, Ser: 1.18
Creatinine, Ser: 1.27
Creatinine, Ser: 1.35
Creatinine, Ser: 1.4
Creatinine, Ser: 1.42
Creatinine, Ser: 1.48
GFR calc Af Amer: 57 — ABNORMAL LOW
GFR calc Af Amer: >60
GFR calc Af Amer: >60
GFR calc Af Amer: >60
GFR calc Af Amer: >60
GFR calc Af Amer: >60
GFR calc non Af Amer: 48 — ABNORMAL LOW
GFR calc non Af Amer: 51 — ABNORMAL LOW
GFR calc non Af Amer: 55 — ABNORMAL LOW
GFR calc non Af Amer: 56 — ABNORMAL LOW
Glucose, Bld: 101 — ABNORMAL HIGH
Glucose, Bld: 113 — ABNORMAL HIGH
Glucose, Bld: 146 — ABNORMAL HIGH
Glucose, Bld: 147 — ABNORMAL HIGH
Glucose, Bld: 90
Potassium: 3.3 — ABNORMAL LOW
Potassium: 3.8
Potassium: 3.8
Potassium: 4
Potassium: 4
Potassium: 4.2
Potassium: 4.5
Potassium: 4.9
Sodium: 132 — ABNORMAL LOW
Sodium: 134 — ABNORMAL LOW
Sodium: 136
Sodium: 137
Sodium: 138

## 2010-10-30 LAB — POCT I-STAT 4, (NA,K, GLUC, HGB,HCT)
Glucose, Bld: 112 — ABNORMAL HIGH
Glucose, Bld: 120 — ABNORMAL HIGH
Glucose, Bld: 137 — ABNORMAL HIGH
HCT: 23 — ABNORMAL LOW
HCT: 25 — ABNORMAL LOW
HCT: 30 — ABNORMAL LOW
Hemoglobin: 10.2 — ABNORMAL LOW
Hemoglobin: 7.5 — CL
Hemoglobin: 7.8 — CL
Hemoglobin: 7.8 — CL
Hemoglobin: 7.8 — CL
Hemoglobin: 8.5 — ABNORMAL LOW
Potassium: 4.5
Potassium: 5.5 — ABNORMAL HIGH
Potassium: 5.5 — ABNORMAL HIGH
Sodium: 133 — ABNORMAL LOW
Sodium: 133 — ABNORMAL LOW
Sodium: 137
Sodium: 137
Sodium: 138

## 2010-10-30 LAB — POCT I-STAT 3, ART BLOOD GAS (G3+)
Acid-Base Excess: 1
Acid-base deficit: 2
Bicarbonate: 24.6 — ABNORMAL HIGH
Bicarbonate: 25.5 — ABNORMAL HIGH
O2 Saturation: 99
Patient temperature: 37.5
pCO2 arterial: 41.6
pH, Arterial: 7.345 — ABNORMAL LOW
pH, Arterial: 7.38
pH, Arterial: 7.399
pO2, Arterial: 227 — ABNORMAL HIGH
pO2, Arterial: 334 — ABNORMAL HIGH

## 2010-10-30 LAB — CREATININE, SERUM
Creatinine, Ser: 0.97
GFR calc Af Amer: >60

## 2010-10-30 LAB — CARDIAC PANEL(CRET KIN+CKTOT+MB+TROPI)
CK, MB: 0.8
CK, MB: 1
CK, MB: 1.2
CK, MB: 13.6 — ABNORMAL HIGH
CK, MB: 13.8 — ABNORMAL HIGH
Relative Index: 11.7 — ABNORMAL HIGH
Relative Index: INVALID
Relative Index: INVALID
Total CK: 38
Troponin I: 0.57
Troponin I: 0.88
Troponin I: 0.89
Troponin I: 2.29

## 2010-10-30 LAB — POCT CARDIAC MARKERS
CKMB, poc: 2
CKMB, poc: <1 — ABNORMAL LOW
Myoglobin, poc: 114
Myoglobin, poc: 164
Myoglobin, poc: 90.3
Troponin i, poc: <0.05

## 2010-10-30 LAB — APTT: aPTT: 92 — ABNORMAL HIGH

## 2010-10-30 LAB — MAGNESIUM
Magnesium: 1.9
Magnesium: 2.1

## 2010-10-30 LAB — GLUCOSE, CAPILLARY
Glucose-Capillary: 100 — ABNORMAL HIGH
Glucose-Capillary: 101 — ABNORMAL HIGH
Glucose-Capillary: 106 — ABNORMAL HIGH
Glucose-Capillary: 107 — ABNORMAL HIGH
Glucose-Capillary: 108 — ABNORMAL HIGH
Glucose-Capillary: 116 — ABNORMAL HIGH
Glucose-Capillary: 116 — ABNORMAL HIGH
Glucose-Capillary: 116 — ABNORMAL HIGH
Glucose-Capillary: 116 — ABNORMAL HIGH
Glucose-Capillary: 120 — ABNORMAL HIGH
Glucose-Capillary: 122 — ABNORMAL HIGH
Glucose-Capillary: 135 — ABNORMAL HIGH
Glucose-Capillary: 135 — ABNORMAL HIGH
Glucose-Capillary: 141 — ABNORMAL HIGH
Glucose-Capillary: 142 — ABNORMAL HIGH
Glucose-Capillary: 143 — ABNORMAL HIGH
Glucose-Capillary: 144 — ABNORMAL HIGH
Glucose-Capillary: 145 — ABNORMAL HIGH
Glucose-Capillary: 146 — ABNORMAL HIGH
Glucose-Capillary: 149 — ABNORMAL HIGH
Glucose-Capillary: 152 — ABNORMAL HIGH
Glucose-Capillary: 155 — ABNORMAL HIGH
Glucose-Capillary: 155 — ABNORMAL HIGH
Glucose-Capillary: 168 — ABNORMAL HIGH
Glucose-Capillary: 172 — ABNORMAL HIGH
Glucose-Capillary: 99
Glucose-Capillary: 99

## 2010-10-30 LAB — POCT I-STAT, CHEM 8
BUN: 15
Calcium, Ion: 1.12
Creatinine, Ser: 1.1
Creatinine, Ser: 1.4
Hemoglobin: 8.2 — ABNORMAL LOW
Hemoglobin: 9.2 — ABNORMAL LOW
Sodium: 138
TCO2: 19
TCO2: 27

## 2010-10-30 LAB — CROSSMATCH: ABO/RH(D): O NEG

## 2010-10-30 LAB — LIPID PANEL
Cholesterol: 145
LDL Cholesterol: 95
Total CHOL/HDL Ratio: 6.9

## 2010-10-30 LAB — HEPARIN LEVEL (UNFRACTIONATED)
Heparin Unfractionated: 0.13 — ABNORMAL LOW
Heparin Unfractionated: 0.4
Heparin Unfractionated: 0.45
Heparin Unfractionated: 0.56
Heparin Unfractionated: 0.62
Heparin Unfractionated: <0.1 — ABNORMAL LOW

## 2010-10-30 LAB — POCT I-STAT 3, VENOUS BLOOD GAS (G3P V)
Bicarbonate: 25.1 — ABNORMAL HIGH
O2 Saturation: 86
TCO2: 26
pO2, Ven: 53 — ABNORMAL HIGH

## 2010-10-30 LAB — BLOOD GAS, ARTERIAL
FIO2: 0.21
O2 Saturation: 95.4
Patient temperature: 98.6

## 2010-10-30 LAB — PROTIME-INR
INR: 1
INR: 1.3
Prothrombin Time: 13.3
Prothrombin Time: 16.6 — ABNORMAL HIGH

## 2010-10-30 LAB — TROPONIN I: Troponin I: 0.94

## 2010-10-30 LAB — URINALYSIS, ROUTINE W REFLEX MICROSCOPIC
Specific Gravity, Urine: 1.016
Urobilinogen, UA: 1

## 2010-10-30 LAB — ABO/RH: ABO/RH(D): O NEG

## 2010-10-30 LAB — HEMOGLOBIN AND HEMATOCRIT, BLOOD: Hemoglobin: 8.2 — ABNORMAL LOW

## 2010-10-30 LAB — D-DIMER, QUANTITATIVE: D-Dimer, Quant: 4.12 — ABNORMAL HIGH

## 2010-10-30 LAB — URINE MICROSCOPIC-ADD ON

## 2010-11-01 LAB — BASIC METABOLIC PANEL
BUN: 14 mg/dL (ref 6–23)
CO2: 27 mEq/L (ref 19–32)
Calcium: 9 mg/dL (ref 8.4–10.5)
Creatinine, Ser: 0.96 mg/dL (ref 0.4–1.5)
Glucose, Bld: 102 mg/dL — ABNORMAL HIGH (ref 70–99)

## 2010-11-01 LAB — DIFFERENTIAL
Basophils Absolute: 0 10*3/uL (ref 0.0–0.1)
Basophils Relative: 1 % (ref 0–1)
Monocytes Absolute: 0.3 10*3/uL (ref 0.1–1.0)
Neutro Abs: 2.4 10*3/uL (ref 1.7–7.7)
Neutrophils Relative %: 60 % (ref 43–77)

## 2010-11-01 LAB — CBC
Hemoglobin: 10 g/dL — ABNORMAL LOW (ref 13.0–17.0)
MCHC: 34 g/dL (ref 30.0–36.0)
Platelets: 224 10*3/uL (ref 150–400)
RDW: 16.3 % — ABNORMAL HIGH (ref 11.5–15.5)

## 2010-11-13 LAB — I-STAT 8, (EC8 V) (CONVERTED LAB)
Acid-Base Excess: 2
BUN: 20
Bicarbonate: 28.7 — ABNORMAL HIGH
Chloride: 100
Glucose, Bld: 166 — ABNORMAL HIGH
HCT: 39
Hemoglobin: 13.3
Operator id: 196461
Potassium: 4.5
Sodium: 135
TCO2: 30
pCO2, Ven: 50.9 — ABNORMAL HIGH
pH, Ven: 7.359 — ABNORMAL HIGH

## 2010-11-13 LAB — TSH: TSH: 0.984

## 2010-11-13 LAB — APTT
aPTT: 161 — ABNORMAL HIGH
aPTT: 30

## 2010-11-13 LAB — POCT CARDIAC MARKERS
CKMB, poc: 1.2
CKMB, poc: 1.4
Myoglobin, poc: 121
Myoglobin, poc: 142
Operator id: 196461
Operator id: 196461
Troponin i, poc: <0.05
Troponin i, poc: <0.05

## 2010-11-13 LAB — BASIC METABOLIC PANEL
BUN: 11
BUN: 13
BUN: 16
BUN: 18
CO2: 26
Calcium: 8.8
Chloride: 106
Chloride: 109
Chloride: 99
Chloride: 99
Creatinine, Ser: 1.2
Creatinine, Ser: 1.23
GFR calc Af Amer: >60
GFR calc Af Amer: >60
GFR calc Af Amer: >60
GFR calc non Af Amer: >60
GFR calc non Af Amer: >60
Glucose, Bld: 137 — ABNORMAL HIGH
Potassium: 4
Potassium: 4.2
Potassium: 4.3
Potassium: 4.4
Sodium: 135
Sodium: 139

## 2010-11-13 LAB — COMPREHENSIVE METABOLIC PANEL
ALT: 24
Albumin: 3.1 — ABNORMAL LOW
Alkaline Phosphatase: 75
BUN: 18
Chloride: 98
Glucose, Bld: 238 — ABNORMAL HIGH
Potassium: 3.9
Total Bilirubin: 0.5

## 2010-11-13 LAB — CBC
HCT: 29.8 — ABNORMAL LOW
HCT: 31.5 — ABNORMAL LOW
HCT: 32.8 — ABNORMAL LOW
HCT: 33.3 — ABNORMAL LOW
Hemoglobin: 10.4 — ABNORMAL LOW
Hemoglobin: 11 — ABNORMAL LOW
Hemoglobin: 11.4 — ABNORMAL LOW
Hemoglobin: 12.8 — ABNORMAL LOW
MCHC: 34.8
MCHC: 35
MCHC: 35
MCV: 85.3
MCV: 85.4
MCV: 85.8
MCV: 86
Platelets: 155
Platelets: 158
Platelets: 160
Platelets: 171
Platelets: 179
RBC: 3.48 — ABNORMAL LOW
RBC: 3.64 — ABNORMAL LOW
RBC: 3.67 — ABNORMAL LOW
RBC: 3.82 — ABNORMAL LOW
RDW: 14.6 — ABNORMAL HIGH
RDW: 14.9 — ABNORMAL HIGH
RDW: 15.1 — ABNORMAL HIGH
RDW: 15.2 — ABNORMAL HIGH
WBC: 4.6
WBC: 5
WBC: 5.1
WBC: 5.9
WBC: 6.4
WBC: 6.5

## 2010-11-13 LAB — BASIC METABOLIC PANEL WITH GFR
CO2: 30
Calcium: 8.9
Creatinine, Ser: 1.2
GFR calc non Af Amer: >60
Glucose, Bld: 163 — ABNORMAL HIGH
Sodium: 134 — ABNORMAL LOW

## 2010-11-13 LAB — CARDIAC PANEL(CRET KIN+CKTOT+MB+TROPI)
CK, MB: 1
CK, MB: 1.1
Relative Index: INVALID
Relative Index: INVALID
Total CK: 37
Total CK: 70
Troponin I: 0.02
Troponin I: 0.05

## 2010-11-13 LAB — HEPARIN LEVEL (UNFRACTIONATED)
Heparin Unfractionated: 0.38
Heparin Unfractionated: 0.58
Heparin Unfractionated: 0.76 — ABNORMAL HIGH
Heparin Unfractionated: 0.94 — ABNORMAL HIGH

## 2010-11-13 LAB — POCT I-STAT CREATININE
Creatinine, Ser: 1.3
Operator id: 196461

## 2010-11-13 LAB — LIPID PANEL
LDL Cholesterol: 102 — ABNORMAL HIGH
VLDL: 36

## 2010-11-13 LAB — PROTIME-INR
INR: 1
INR: 1.1
Prothrombin Time: 13.6
Prothrombin Time: 14.7

## 2010-11-13 LAB — DIFFERENTIAL
Basophils Absolute: 0
Lymphocytes Relative: 32
Monocytes Absolute: 0.4
Neutro Abs: 3.5

## 2010-11-13 LAB — IRON AND TIBC
Iron: 135
TIBC: 298

## 2012-10-24 ENCOUNTER — Inpatient Hospital Stay (HOSPITAL_COMMUNITY)
Admission: EM | Admit: 2012-10-24 | Discharge: 2012-10-28 | DRG: 281 | Disposition: A | Discharge status: Home / Self Care | Payer: Medicaid Other | Attending: Cardiology | Admitting: Cardiology

## 2012-10-24 ENCOUNTER — Encounter (HOSPITAL_COMMUNITY): Payer: Self-pay | Admitting: Emergency Medicine

## 2012-10-24 ENCOUNTER — Inpatient Hospital Stay (HOSPITAL_COMMUNITY): Payer: Medicaid Other

## 2012-10-24 ENCOUNTER — Encounter (HOSPITAL_COMMUNITY)
Admission: EM | Disposition: A | Discharge status: Home / Self Care | Payer: Self-pay | Source: Home / Self Care | Attending: Cardiology

## 2012-10-24 ENCOUNTER — Emergency Department (HOSPITAL_COMMUNITY): Payer: Medicaid Other

## 2012-10-24 DIAGNOSIS — Z7902 Long term (current) use of antithrombotics/antiplatelets: Secondary | ICD-10-CM

## 2012-10-24 DIAGNOSIS — I2582 Chronic total occlusion of coronary artery: Secondary | ICD-10-CM | POA: Diagnosis present

## 2012-10-24 DIAGNOSIS — Z8249 Family history of ischemic heart disease and other diseases of the circulatory system: Secondary | ICD-10-CM

## 2012-10-24 DIAGNOSIS — I739 Peripheral vascular disease, unspecified: Secondary | ICD-10-CM

## 2012-10-24 DIAGNOSIS — Z6831 Body mass index (BMI) 31.0-31.9, adult: Secondary | ICD-10-CM

## 2012-10-24 DIAGNOSIS — E785 Hyperlipidemia, unspecified: Secondary | ICD-10-CM

## 2012-10-24 DIAGNOSIS — I70219 Atherosclerosis of native arteries of extremities with intermittent claudication, unspecified extremity: Secondary | ICD-10-CM | POA: Diagnosis present

## 2012-10-24 DIAGNOSIS — Z9119 Patient's noncompliance with other medical treatment and regimen: Secondary | ICD-10-CM

## 2012-10-24 DIAGNOSIS — I25110 Atherosclerotic heart disease of native coronary artery with unstable angina pectoris: Secondary | ICD-10-CM | POA: Diagnosis present

## 2012-10-24 DIAGNOSIS — Z91199 Patient's noncompliance with other medical treatment and regimen due to unspecified reason: Secondary | ICD-10-CM

## 2012-10-24 DIAGNOSIS — Z841 Family history of disorders of kidney and ureter: Secondary | ICD-10-CM

## 2012-10-24 DIAGNOSIS — Z7982 Long term (current) use of aspirin: Secondary | ICD-10-CM

## 2012-10-24 DIAGNOSIS — E119 Type 2 diabetes mellitus without complications: Secondary | ICD-10-CM

## 2012-10-24 DIAGNOSIS — N2889 Other specified disorders of kidney and ureter: Secondary | ICD-10-CM | POA: Diagnosis present

## 2012-10-24 DIAGNOSIS — I10 Essential (primary) hypertension: Secondary | ICD-10-CM

## 2012-10-24 DIAGNOSIS — I25810 Atherosclerosis of coronary artery bypass graft(s) without angina pectoris: Secondary | ICD-10-CM | POA: Diagnosis present

## 2012-10-24 DIAGNOSIS — K219 Gastro-esophageal reflux disease without esophagitis: Secondary | ICD-10-CM | POA: Diagnosis present

## 2012-10-24 DIAGNOSIS — Z79899 Other long term (current) drug therapy: Secondary | ICD-10-CM

## 2012-10-24 DIAGNOSIS — I252 Old myocardial infarction: Secondary | ICD-10-CM

## 2012-10-24 DIAGNOSIS — E7800 Pure hypercholesterolemia, unspecified: Secondary | ICD-10-CM

## 2012-10-24 DIAGNOSIS — E871 Hypo-osmolality and hyponatremia: Secondary | ICD-10-CM | POA: Diagnosis present

## 2012-10-24 DIAGNOSIS — I219 Acute myocardial infarction, unspecified: Secondary | ICD-10-CM

## 2012-10-24 DIAGNOSIS — E669 Obesity, unspecified: Secondary | ICD-10-CM | POA: Diagnosis present

## 2012-10-24 DIAGNOSIS — I498 Other specified cardiac arrhythmias: Secondary | ICD-10-CM | POA: Diagnosis present

## 2012-10-24 DIAGNOSIS — Z23 Encounter for immunization: Secondary | ICD-10-CM

## 2012-10-24 DIAGNOSIS — I2119 ST elevation (STEMI) myocardial infarction involving other coronary artery of inferior wall: Principal | ICD-10-CM | POA: Diagnosis present

## 2012-10-24 DIAGNOSIS — I213 ST elevation (STEMI) myocardial infarction of unspecified site: Secondary | ICD-10-CM

## 2012-10-24 DIAGNOSIS — Z87891 Personal history of nicotine dependence: Secondary | ICD-10-CM

## 2012-10-24 DIAGNOSIS — I2510 Atherosclerotic heart disease of native coronary artery without angina pectoris: Secondary | ICD-10-CM

## 2012-10-24 DIAGNOSIS — J209 Acute bronchitis, unspecified: Secondary | ICD-10-CM

## 2012-10-24 HISTORY — DX: Atherosclerotic heart disease of native coronary artery without angina pectoris: I25.10

## 2012-10-24 HISTORY — DX: Patient's noncompliance with other medical treatment and regimen due to unspecified reason: Z91.199

## 2012-10-24 HISTORY — DX: Hyperlipidemia, unspecified: E78.5

## 2012-10-24 HISTORY — DX: Peripheral vascular disease, unspecified: I73.9

## 2012-10-24 HISTORY — DX: Essential (primary) hypertension: I10

## 2012-10-24 HISTORY — PX: LEFT HEART CATH: SHX5478

## 2012-10-24 HISTORY — DX: Patient's noncompliance with other medical treatment and regimen: Z91.19

## 2012-10-24 LAB — CBC WITH DIFFERENTIAL/PLATELET
Basophils Absolute: 0 10*3/uL (ref 0.0–0.1)
Basophils Relative: 0 % (ref 0–1)
Lymphocytes Relative: 5 % — ABNORMAL LOW (ref 12–46)
MCHC: 36.3 g/dL — ABNORMAL HIGH (ref 30.0–36.0)
Neutro Abs: 12 10*3/uL — ABNORMAL HIGH (ref 1.7–7.7)
Neutrophils Relative %: 91 % — ABNORMAL HIGH (ref 43–77)
Platelets: 165 10*3/uL (ref 150–400)
RDW: 14.5 % (ref 11.5–15.5)
WBC: 13.3 10*3/uL — ABNORMAL HIGH (ref 4.0–10.5)

## 2012-10-24 LAB — BASIC METABOLIC PANEL
Chloride: 97 mEq/L (ref 96–112)
Creatinine, Ser: 1.02 mg/dL (ref 0.50–1.35)
GFR calc Af Amer: >90 mL/min (ref >90)
Potassium: 3.7 mEq/L (ref 3.5–5.1)
Sodium: 132 mEq/L — ABNORMAL LOW (ref 135–145)

## 2012-10-24 LAB — MRSA PCR SCREENING: MRSA by PCR: NEGATIVE

## 2012-10-24 LAB — HEPATIC FUNCTION PANEL
ALT: 16 U/L (ref 0–53)
Albumin: 2.9 g/dL — ABNORMAL LOW (ref 3.5–5.2)
Alkaline Phosphatase: 80 U/L (ref 39–117)
Total Protein: 6.3 g/dL (ref 6.0–8.3)

## 2012-10-24 LAB — PRO B NATRIURETIC PEPTIDE: Pro B Natriuretic peptide (BNP): 438.4 pg/mL — ABNORMAL HIGH (ref 0–125)

## 2012-10-24 LAB — POCT I-STAT TROPONIN I: Troponin i, poc: 0 ng/mL (ref 0.00–0.08)

## 2012-10-24 LAB — TROPONIN I: Troponin I: 7.51 ng/mL (ref <0.30)

## 2012-10-24 SURGERY — LEFT HEART CATH
Anesthesia: LOCAL

## 2012-10-24 MED ORDER — SODIUM CHLORIDE 0.9 % IV SOLN: 250.0000 mL | INTRAVENOUS | Status: DC | PRN

## 2012-10-24 MED ORDER — AZITHROMYCIN 500 MG IV SOLR
500.0000 mg | Freq: Once | INTRAVENOUS | Status: DC
  Filled 2012-10-24: qty 500

## 2012-10-24 MED ORDER — DEXTROSE 5 % IV SOLN: 1.0000 g | Freq: Once | INTRAVENOUS | Status: DC

## 2012-10-24 MED ORDER — INSULIN ASPART 100 UNIT/ML ~~LOC~~ SOLN
0.0000 [IU] | Freq: Three times a day (TID) | SUBCUTANEOUS | Status: DC
  Administered 2012-10-25 (×2): via SUBCUTANEOUS
  Administered 2012-10-26 (×3): 2 [IU] via SUBCUTANEOUS
  Administered 2012-10-27 (×3): 3 [IU] via SUBCUTANEOUS
  Administered 2012-10-28: 2 [IU] via SUBCUTANEOUS
  Administered 2012-10-28: 3 [IU] via SUBCUTANEOUS

## 2012-10-24 MED ORDER — ACETAMINOPHEN 325 MG PO TABS: 650.0000 mg | ORAL_TABLET | ORAL | Status: DC | PRN

## 2012-10-24 MED ORDER — ATORVASTATIN CALCIUM 80 MG PO TABS: 80.0000 mg | ORAL_TABLET | Freq: Every day | ORAL | Status: DC

## 2012-10-24 MED ORDER — HEPARIN SODIUM (PORCINE) 5000 UNIT/ML IJ SOLN
5000.0000 [IU] | Freq: Three times a day (TID) | INTRAMUSCULAR | Status: DC
  Administered 2012-10-24 – 2012-10-28 (×12): 5000 [IU] via SUBCUTANEOUS
  Filled 2012-10-24 (×14): qty 1

## 2012-10-24 MED ORDER — METOPROLOL TARTRATE 25 MG PO TABS
25.0000 mg | ORAL_TABLET | Freq: Two times a day (BID) | ORAL | Status: DC
  Administered 2012-10-24 – 2012-10-25 (×2): 25 mg via ORAL
  Filled 2012-10-24 (×3): qty 1

## 2012-10-24 MED ORDER — FENTANYL CITRATE 0.05 MG/ML IJ SOLN
INTRAMUSCULAR | Status: AC
  Filled 2012-10-24: qty 2

## 2012-10-24 MED ORDER — NITROGLYCERIN IN D5W 200-5 MCG/ML-% IV SOLN
10.0000 ug/min | Freq: Once | INTRAVENOUS | Status: AC
  Administered 2012-10-24: 16:00:00 via INTRAVENOUS

## 2012-10-24 MED ORDER — DEXTROSE 5 % IV SOLN
500.0000 mg | INTRAVENOUS | Status: DC
  Filled 2012-10-24: qty 500

## 2012-10-24 MED ORDER — NITROGLYCERIN IN D5W 200-5 MCG/ML-% IV SOLN
INTRAVENOUS | Status: AC
  Filled 2012-10-24: qty 250

## 2012-10-24 MED ORDER — HEPARIN (PORCINE) IN NACL 2-0.9 UNIT/ML-% IJ SOLN
INTRAMUSCULAR | Status: AC
  Filled 2012-10-24: qty 1000

## 2012-10-24 MED ORDER — ONDANSETRON HCL 4 MG/2ML IJ SOLN: 4.0000 mg | Freq: Four times a day (QID) | INTRAMUSCULAR | Status: DC | PRN

## 2012-10-24 MED ORDER — SODIUM CHLORIDE 0.9 % IV SOLN: INTRAVENOUS | Status: DC

## 2012-10-24 MED ORDER — ATROPINE SULFATE 0.1 MG/ML IJ SOLN
INTRAMUSCULAR | Status: AC
  Filled 2012-10-24: qty 10

## 2012-10-24 MED ORDER — NITROGLYCERIN IN D5W 200-5 MCG/ML-% IV SOLN
2.0000 ug/min | INTRAVENOUS | Status: DC
  Filled 2012-10-24: qty 250

## 2012-10-24 MED ORDER — SODIUM CHLORIDE 0.9 % IJ SOLN: 3.0000 mL | INTRAMUSCULAR | Status: DC | PRN

## 2012-10-24 MED ORDER — HEPARIN (PORCINE) IN NACL 100-0.45 UNIT/ML-% IJ SOLN
1400.0000 [IU]/h | INTRAMUSCULAR | Status: DC
  Filled 2012-10-24: qty 250

## 2012-10-24 MED ORDER — HEPARIN SODIUM (PORCINE) 5000 UNIT/ML IJ SOLN: 4000.0000 [IU] | Freq: Once | INTRAMUSCULAR | Status: DC

## 2012-10-24 MED ORDER — SODIUM CHLORIDE 0.9 % IV BOLUS (SEPSIS): 1000.0000 mL | Freq: Once | INTRAVENOUS | Status: DC

## 2012-10-24 MED ORDER — METOPROLOL TARTRATE 1 MG/ML IV SOLN
INTRAVENOUS | Status: AC
Start: 2012-10-24 — End: 2012-10-24
  Filled 2012-10-24: qty 5

## 2012-10-24 MED ORDER — HEPARIN SODIUM (PORCINE) 5000 UNIT/ML IJ SOLN
INTRAMUSCULAR | Status: AC
  Filled 2012-10-24: qty 1

## 2012-10-24 MED ORDER — ACETAMINOPHEN 325 MG PO TABS
650.0000 mg | ORAL_TABLET | ORAL | Status: DC | PRN
  Administered 2012-10-25 – 2012-10-27 (×4): 650 mg via ORAL
  Filled 2012-10-24 (×4): qty 2

## 2012-10-24 MED ORDER — SODIUM CHLORIDE 0.9 % IJ SOLN
3.0000 mL | Freq: Two times a day (BID) | INTRAMUSCULAR | Status: DC
  Administered 2012-10-24 – 2012-10-28 (×7): 3 mL via INTRAVENOUS

## 2012-10-24 MED ORDER — DEXTROSE 5 % IV SOLN: 500.0000 mg | Freq: Once | INTRAVENOUS | Status: DC

## 2012-10-24 MED ORDER — NITROGLYCERIN 0.2 MG/ML ON CALL CATH LAB
INTRAVENOUS | Status: AC
  Filled 2012-10-24: qty 1

## 2012-10-24 MED ORDER — ATORVASTATIN CALCIUM 80 MG PO TABS
80.0000 mg | ORAL_TABLET | Freq: Every day | ORAL | Status: DC
  Administered 2012-10-24 – 2012-10-27 (×4): 80 mg via ORAL
  Filled 2012-10-24 (×5): qty 1

## 2012-10-24 MED ORDER — ASPIRIN EC 325 MG PO TBEC
325.0000 mg | DELAYED_RELEASE_TABLET | Freq: Once | ORAL | Status: AC
  Administered 2012-10-24: 325 mg via ORAL
  Filled 2012-10-24: qty 1

## 2012-10-24 MED ORDER — ACETAMINOPHEN 325 MG PO TABS
650.0000 mg | ORAL_TABLET | Freq: Once | ORAL | Status: AC
  Administered 2012-10-24: 650 mg via ORAL
  Filled 2012-10-24: qty 2

## 2012-10-24 MED ORDER — LIDOCAINE HCL (PF) 1 % IJ SOLN
INTRAMUSCULAR | Status: AC
  Filled 2012-10-24: qty 30

## 2012-10-24 MED ORDER — SODIUM CHLORIDE 0.9 % IV BOLUS (SEPSIS)
500.0000 mL | Freq: Once | INTRAVENOUS | Status: AC
  Administered 2012-10-24: 500 mL via INTRAVENOUS

## 2012-10-24 MED ORDER — NITROGLYCERIN 0.4 MG SL SUBL
0.4000 mg | SUBLINGUAL_TABLET | SUBLINGUAL | Status: DC | PRN
  Administered 2012-10-24: 0.4 mg via SUBLINGUAL

## 2012-10-24 MED ORDER — MIDAZOLAM HCL 2 MG/2ML IJ SOLN
INTRAMUSCULAR | Status: AC
  Filled 2012-10-24: qty 2

## 2012-10-24 MED ORDER — HEPARIN SODIUM (PORCINE) 5000 UNIT/ML IJ SOLN
4000.0000 [IU] | Freq: Once | INTRAMUSCULAR | Status: AC
  Administered 2012-10-24: 4000 [IU] via INTRAVENOUS

## 2012-10-24 MED ORDER — ASPIRIN EC 81 MG PO TBEC
81.0000 mg | DELAYED_RELEASE_TABLET | Freq: Every day | ORAL | Status: DC
  Administered 2012-10-25 – 2012-10-28 (×4): 81 mg via ORAL
  Filled 2012-10-24 (×4): qty 1

## 2012-10-24 MED ORDER — ASPIRIN EC 81 MG PO TBEC: 81.0000 mg | DELAYED_RELEASE_TABLET | Freq: Every day | ORAL | Status: DC

## 2012-10-24 MED ORDER — AZITHROMYCIN 500 MG PO TABS: 500.0000 mg | ORAL_TABLET | Freq: Every day | ORAL | Status: DC

## 2012-10-24 NOTE — CV Procedure (Signed)
Darrell [name] is a 57 y.o. male    [id number]  [id number] LOCATION:  FACILITY: [facility]  PHYSICIAN: Lennette Bihari, MD, [facility] 14-Sep-1955   DATE OF PROCEDURE:  10/24/2012    [facility]  EMERGENT CARDIAC CATHETERIZATION     HISTORY:    Mr. Darrell [name] is a 57 year old gentleman who underwent CABG revascularization surgery in November 2009 by Dr. Dorris Fetch and reportedly had a LIMA graft placed to the LAD, left radial graft to the marginal vein graft sequentially to the PDA and PLA and a reported graft to the diagonal. He is not had followup evaluation recently and has been off medications. He has a history of diabetes. He apparently presented to the emergency room with pain in his right lower abdomen and symptoms suggestive of possible pulmonary infection per while being evaluated he also describes some vague chest pain. ECG was done which suggested early inferior ST elevation and high lateral changes and a Code STEMI was called. The patient was evaluated [name]. He presents now for acute catheterization and  possible intervention.    PROCEDURE:  Upon arrival to the catheterization laboratory, the patient was pain-free. However he was hypertensive. He was given Versed 2 mg plus fentanyl 50 mcg for conscious sedation. His right femoral artery was punctured anteriorly and a 6 French sheath was inserted without difficulty. Diagnostic catheterization was done utilizing 6 [place] FL5, FR4, right bypass graft, and LIMA catheters. A 6 French pigtail catheter was used for RAO ventriculography. During the procedure, because the patient was hypertensive he was started on IV the nitroglycerin and ultimately this was titrated up to 40 mcg per also received several doses of Lopressor IV 2.5 mg and prior to leaving the lab he received a total dose of 10 mg a day his blood pressure and tachycardia. He left the catheterization laboratory pain-free with stable  hemodynamics.  HEMODYNAMICS:   Central Aorta: Initial central aortic pressure is 202/119. Blood pressure did improve to 180/90 on left ventricular pullback.  Left Ventricle:  180/21  ANGIOGRAPHY:  1. Left main: Short vessel which bifurcated into an LAD and left circumflex system. 2. LAD: Totally occluded proximally after a small proximal septal perforating artery. 3. Left circumflex: Dominant vessel. The first marginal had diffuse 95% proximal stenoses. A 70% stenosis proximally the groove. It appear to be a stent in the mid AV groove segment after several marginal after a bifurcating marginal vessel. 90% distally the groove circumflex diffuse 90% narrowing in the distal PLA branch like vessel the circumflex.  4. Right coronary artery: Totally occluded in its proximal segment. There did appear to be at a diffuse disease in the midsegment which had low via bridging collaterals. The distal RCA was visualized antegrade, there were collaterals supplying the distal RCA from collaterals via the LIMA to LAD injection as well as the radial graft to circumflex marginal injections. 5.LIMA TO LAD: Widely patent. The LAD filled retrograde up to the anterior point of proximal occlusion. There were several proximal lock moderate size septal perforating arteries. The distal LAD was diffusely diseased and had diffuse 95% stenoses and very small caliber vessel. 6. Radial graft to the obtuse marginal vessel was widely patent. 3 blanches seemed to arise from this radial to marginal insertion. There also is a collateral supply the distal RCA as well as collateralization to what appeared to perhaps be a diagonal vessel.  7. SVG to right artery which apparently was a sequential graft was occluded at its  origin. Only 2  markers were present in the aorta, and it did not appear that there was another vein grafts arising from the aorta supplying a diagonal vessel   Left ventriculography revealed normal LV contractility  with ejection fraction of at least 60-65%. There did appear to be left ventricular hypertrophy.    IMPRESSION:  Normal LV function with an ejection fraction of 60-65% without segmental wall motion abnormalities.  Significant native coronary disease with total occlusion of the proximal LAD; diffuse disease in the circumflex vessel with 70% proximal 90% distally the distal AV groove and PLA like distal vessel and total proximal occlusion of the right renal artery with bridging collaterals.  Occluded vein graft which reportedly had supplied the PDA and PLA vessel.  Patent radial graft which supplies left circumflex marginal territory and gives rise to 3 branches and collateralizes distal RCA and possible diagonal vessel  Patent LIMA to LAD with filling retrograde back up to the point of proximal occlusion,   the distal LAD isvery diminutive and has diffuse narrowing with collateralization to the distal RCA.     RECOMMENDATION:  Medical therapy. During the procedure due to hypertension and tachycardia, the patient  was titrated up to 50 mcg of IV nitroglycerin and also received a total of milligrams of intravenous metoprolol tartrate. He may very well be a candidate for the addition of ranolazine since several areas of stenoses are not supplied by grafts.   Lennette Bihari, MD, [facility] 10/24/2012 4:59 PM

## 2012-10-24 NOTE — ED Notes (Signed)
EKG done at 1450 too much artifact. EDT repeating EKG at this time; difficult to get clear lines as patient keeps moving.

## 2012-10-24 NOTE — ED Notes (Signed)
To cath lab at 1559.

## 2012-10-24 NOTE — ED Notes (Signed)
Notified providers of patient temp and heart rate that qualifies for sepsis work up.

## 2012-10-24 NOTE — H&P (Addendum)
CARDIOLOGY ADMISSION NOTE  Patient ID: Darrell [name] MRN: [id number] DOB/AGE: 06/15/1955 57 y.o.  Admit date: 10/24/2012 Primary Physician   None Primary Cardiologist   None since CABG Chief Complaint    Chest pain  HPI:  The patient presented to the ER today with pain in his right lower abdomen and symptoms suggestive of possibly pulmonary infection.  He was febrile in the ER. A workup was started for possible pneumonia.  While he was being assessed he described chest pain.  This was mild mid pain.  His BP was very elevated.  EKG demonstrated acute inferior ST elevation and high lateral changes as above.  Code STEMI was called.  He has had no chest pain prior to this and before this morning he was fine.  Prior to this the patient was denying any new symptoms such as chest discomfort, neck or arm discomfort. There has been no new shortness of breath, PND or orthopnea. There have been no reported palpitations, presyncope or syncope.  He did report some cough with green sputum.  He reported chills.  There has been no nausea or vomiting. He has had no neck or arm pain.   Past Medical History  Diagnosis Date  . Coronary artery disease   . Hypertension   . Diabetes mellitus without complication     Past Surgical History  Procedure Laterality Date  . Coronary artery bypass graft      No Known Allergies No current facility-administered medications on file prior to encounter.   No current outpatient prescriptions on file prior to encounter.   History   Social History  . Marital Status: Married    Spouse Name: N/A    Number of Children: N/A  . Years of Education: N/A   Occupational History  . Not on file.   Social History Main Topics  . Smoking status: Former Smoker    Quit date: 10/25/2010  . Smokeless tobacco: Not on file  . Alcohol Use: No  . Drug Use: No  . Sexual Activity: Not on file   Other Topics Concern  . Not on file   Social History Narrative  . No narrative  on file    Family History  Problem Relation Age of Onset  . Heart failure Mother   . Cancer Mother   . Heart failure Father     ROS:  As stated in the HPI and negative for all other systems.  Physical Exam: GENERAL:  Well appearing, unkempt HEENT:  Pupils equal round and reactive, fundi not visualized, oral mucosa unremarkable NECK:  No jugular venous distention, waveform within normal limits, carotid upstroke brisk and symmetric, no bruits, no thyromegaly LYMPHATICS:  No cervical, inguinal adenopathy LUNGS:  Clear to auscultation bilaterally BACK:  No CVA tenderness CHEST Well healed sternotomy scar. HEART:  PMI not displaced or sustained,S1 and S2 within normal limits, no S3, no S4, no clicks, no rubs, no murmurs ABD:  Flat, positive bowel sounds normal in frequency in pitch, no bruits, no rebound, no guarding, no midline pulsatile mass, no hepatomegaly, no splenomegaly EXT:  2 plus pulses throughout, no edema, no cyanosis no clubbing SKIN:  No rashes no nodules NEURO:  Cranial nerves II through XII grossly intact, motor grossly intact throughout PSYCH:  Cognitively intact, oriented to person place and time  Labs:  Pending  Radiology:  Pending  EKG:  Sinus tachycardia, rate 108, early transition in lead V2, poor anterior R wave progression across the lateral precordial leads, inferior  ST elevation consistent with acute injury pattern with high lateral ST depression.  ASSESSMENT AND PLAN:    ACUTE INFERIOR MI:   The patient will be taken directly to the cath lab.  The patient understands that risks included but are not limited to stroke (1 in 1000), death (1 in 1000), kidney failure [usually temporary] (1 in 500), bleeding (1 in 200), allergic reaction [possibly serious] (1 in 200).  The patient understands and agrees to proceed.   DM:  He will need treatment for this he has been off of all medications.    HTN (ACCELERATED HTN):  BP is elevated and I suspect uncontrolled at  home.  He will need to be restarted on antihypertensives.    SignedRollene Rotunda 10/24/2012, 3:39 PM

## 2012-10-24 NOTE — Progress Notes (Signed)
ANTICOAGULATION CONSULT NOTE - Initial Consult  Pharmacy Consult for heparin Indication: chest pain/ACS  No Known Allergies  Patient Measurements: Height: 6\' 1"  (185.4 cm) Weight: 235 lb (106.595 kg) IBW/kg (Calculated) : 79.9 Heparin Dosing Weight: 102kg  Vital Signs: Temp: 102.9 F (39.4 C) (09/27 1504) Temp src: Oral (09/27 1504) BP: 157/42 mmHg (09/27 1538) Pulse Rate: 110 (09/27 1538)  Labs:  Recent Labs  10/24/12 1530  HGB 14.7  HCT 40.5  PLT 165    Estimated Creatinine Clearance: 110.1 ml/min (by C-G formula based on Cr of 0.96).   Medical History: Past Medical History  Diagnosis Date  . Coronary artery disease   . Hypertension   . Diabetes mellitus without complication     Assessment: 57 yo male here with CODE STEMI to start heparin.  A heparin bolus of 4000 units was given in ED at 3:45pm today.  Goal of Therapy:  Heparin level 0.3-0.7 units/ml Monitor platelets by anticoagulation protocol: Yes   Plan:  -Begin heparin at1400 units/hr (~14 units/kg/hr) -Heparin level in 6 hours and daily wth CBC daily  Harland German, Pharm D 10/24/2012 4:01 PM

## 2012-10-24 NOTE — Progress Notes (Signed)
Chaplain to page from ED concerning code stemi. Patient had no family present so chaplain asked if there was anyone he would like to be contacted. He asked that chaplain call his wife. Chaplain attempted call, but it was out of service. Chaplain returned to his room to inform him, and he was on the phone with his son. Patient did not seem to have further needs.

## 2012-10-24 NOTE — ED Notes (Signed)
Cardiology at bedside.

## 2012-10-24 NOTE — ED Provider Notes (Signed)
CSN: [id number]     Arrival date & time 10/24/12  1417 History   First MD Initiated Contact with Patient 10/24/12 1458     Chief Complaint  Patient presents with  . Tremors  . Code STEMI   (Consider location/radiation/quality/duration/timing/severity/associated sxs/prior Treatment) Patient is a 57 y.o. male presenting with general illness.  Illness Location:  Generalized Quality:  Rigors Severity:  Moderate Onset quality:  Gradual Duration:  5 hours Timing:  Constant Progression:  Unchanged Chronicity:  New Context:  Attempting to have bm Relieved by:  Nothing tried Worsened by:  Nothing Associated symptoms: chest pain, cough (productive, green) and nausea   Associated symptoms: no abdominal pain, no congestion, no diarrhea, no fever, no headaches, no rash, no rhinorrhea, no shortness of breath, no sore throat, no vomiting and no wheezing     Past Medical History  Diagnosis Date  . Coronary artery disease   . Hypertension   . Diabetes mellitus without complication    Past Surgical History  Procedure Laterality Date  . Coronary artery bypass graft      2009 LIMA to LAD, SVG to Diag, SVG to PDA and PL, left radial to OM   Family History  Problem Relation Age of Onset  . Heart failure Mother   . Cancer Mother   . Heart failure Father   . CAD Mother 56  . Kidney failure Brother    History  Substance Use Topics  . Smoking status: Former Smoker    Quit date: 10/25/2010  . Smokeless tobacco: Not on file  . Alcohol Use: No    Review of Systems  Constitutional: Negative for fever and chills.  HENT: Negative for congestion, sore throat and rhinorrhea.   Eyes: Negative for photophobia and visual disturbance.  Respiratory: Positive for cough (productive, green). Negative for shortness of breath and wheezing.   Cardiovascular: Positive for chest pain. Negative for leg swelling.  Gastrointestinal: Positive for nausea. Negative for vomiting, abdominal pain, diarrhea and  constipation.  Endocrine: Negative for polydipsia and polyuria.  Genitourinary: Negative for dysuria and hematuria.  Musculoskeletal: Negative for back pain and arthralgias.  Skin: Negative for color change and rash.  Neurological: Negative for dizziness, syncope, light-headedness and headaches.  Hematological: Negative for adenopathy. Does not bruise/bleed easily.  All other systems reviewed and are negative.    Allergies  Review of patient's allergies indicates no known allergies.  Home Medications  No current outpatient prescriptions on file. BP 186/98  Pulse 113  Temp(Src) 102.9 F (39.4 C) (Oral)  Resp 25  Ht 6\' 1"  (1.854 m)  Wt 235 lb (106.595 kg)  BMI 31.01 kg/m2  SpO2 96% Physical Exam  Vitals reviewed. Constitutional: He is oriented to person, place, and time. He appears well-developed and well-nourished.  HENT:  Head: Normocephalic and atraumatic.  Eyes: Conjunctivae and EOM are normal.  Neck: Normal range of motion. Neck supple.  Cardiovascular: Normal rate, regular rhythm and normal heart sounds.   Pulmonary/Chest: Effort normal. No respiratory distress. He has decreased breath sounds in the right lower field. He has rales in the right lower field and the left lower field.  Abdominal: He exhibits no distension. There is no tenderness. There is no rebound and no guarding.  Musculoskeletal: Normal range of motion.  Neurological: He is alert and oriented to person, place, and time.  Skin: Skin is warm and dry.    ED Course  Procedures (including critical care time) Labs Review Labs Reviewed  CBC WITH DIFFERENTIAL - Abnormal;  Notable for the following:    WBC 13.3 (*)    MCHC 36.3 (*)    Neutrophils Relative % 91 (*)    Neutro Abs 12.0 (*)    Lymphocytes Relative 5 (*)    Lymphs Abs 0.6 (*)    All other components within normal limits  BASIC METABOLIC PANEL - Abnormal; Notable for the following:    Sodium 132 (*)    Glucose, Bld 121 (*)    GFR calc non  Af Amer 80 (*)    All other components within normal limits  PRO B NATRIURETIC PEPTIDE - Abnormal; Notable for the following:    Pro B Natriuretic peptide (BNP) 438.4 (*)    All other components within normal limits  URINE CULTURE  TROPONIN I  URINALYSIS, ROUTINE W REFLEX MICROSCOPIC  HEPARIN LEVEL (UNFRACTIONATED)  POCT I-STAT TROPONIN I   Imaging Review No results found.  Date: 10/24/2012  Rate: 108  Rhythm: sinus tachycardia  QRS Axis: normal  Intervals: QT prolonged  ST/T Wave abnormalities: ST elevations inferiorly, ST depressions laterally and acute myocardial infarction  Conduction Disutrbances:none  Narrative Interpretation:   Old EKG Reviewed: changes noted with new STEMI   MDM   1. STEMI (ST elevation myocardial infarction)    57 y.o. male  with pertinent PMH of CAD, HTN, DM presents with rigors while taking a bm.  Pt was in normal state of health prior to onset of symptoms, however does endorse productive cough.  No measured fevers however pt has had chills and pleuritic pain in R CVA area.  On arrival pt claimed to have onset of chest pain as well.  Pt febrile to 103, tachycardic to 120, sats 95%.  Physical exam as above with diminished RLL sounds, bibasilar rales, no pedal edema or signs of chf, pt not complaining of dyspnea.    ECG as above with STE, in setting of onset of chest pain during exam, concerning for STEMI.  Code stemi called, pt given asa, pt taken emergently to cath lab by cardiology.    Labs and imaging as above reviewed by myself and attending,Dr. Radford Pax, with whom case was discussed.   1. STEMI (ST elevation myocardial infarction)         Noel Gerold, MD 10/24/12 [phone]

## 2012-10-24 NOTE — ED Notes (Signed)
Heart leads applied and EKG done.

## 2012-10-24 NOTE — ED Notes (Signed)
An hour ago right side was hurting, tried to use bathroom, but couldn't go. After that started shaking and tremoring; felt anxious. Called EMS. [facility] reports upper body tremors on their arrival, but has calmed down during transport. Currently denies pain, no tremor noted. Denies drug or alcohol use. Denies using any medication at home.

## 2012-10-24 NOTE — Progress Notes (Signed)
R groin femoral sheath pulled at 1850. Manual pressure held until 1915. Site dressed with 2x2 and transparent dsg. Site without bruising, bleeding or hematoma. Pt tolerated procedure well.

## 2012-10-25 DIAGNOSIS — I10 Essential (primary) hypertension: Secondary | ICD-10-CM

## 2012-10-25 DIAGNOSIS — E7800 Pure hypercholesterolemia, unspecified: Secondary | ICD-10-CM

## 2012-10-25 DIAGNOSIS — E119 Type 2 diabetes mellitus without complications: Secondary | ICD-10-CM

## 2012-10-25 DIAGNOSIS — J209 Acute bronchitis, unspecified: Secondary | ICD-10-CM

## 2012-10-25 DIAGNOSIS — I2510 Atherosclerotic heart disease of native coronary artery without angina pectoris: Secondary | ICD-10-CM

## 2012-10-25 LAB — LIPID PANEL
Cholesterol: 146 mg/dL (ref 0–200)
LDL Cholesterol: 101 mg/dL — ABNORMAL HIGH (ref 0–99)
Triglycerides: 82 mg/dL (ref <150)

## 2012-10-25 LAB — URINALYSIS, ROUTINE W REFLEX MICROSCOPIC
Ketones, ur: NEGATIVE mg/dL
Nitrite: POSITIVE — AB
Specific Gravity, Urine: 1.01 (ref 1.005–1.030)
pH: 7 (ref 5.0–8.0)

## 2012-10-25 LAB — URINE MICROSCOPIC-ADD ON

## 2012-10-25 LAB — BASIC METABOLIC PANEL
CO2: 25 mEq/L (ref 19–32)
Calcium: 8.2 mg/dL — ABNORMAL LOW (ref 8.4–10.5)
Creatinine, Ser: 1.17 mg/dL (ref 0.50–1.35)
Glucose, Bld: 138 mg/dL — ABNORMAL HIGH (ref 70–99)
Potassium: 4.1 mEq/L (ref 3.5–5.1)
Sodium: 133 mEq/L — ABNORMAL LOW (ref 135–145)

## 2012-10-25 LAB — TROPONIN I
Troponin I: 8.57 ng/mL (ref <0.30)
Troponin I: 8.4 ng/mL (ref <0.30)

## 2012-10-25 MED ORDER — CARVEDILOL 6.25 MG PO TABS
6.2500 mg | ORAL_TABLET | Freq: Two times a day (BID) | ORAL | Status: DC
  Administered 2012-10-25 – 2012-10-26 (×2): 6.25 mg via ORAL
  Filled 2012-10-25 (×6): qty 1

## 2012-10-25 MED ORDER — AZITHROMYCIN 250 MG PO TABS
250.0000 mg | ORAL_TABLET | Freq: Every day | ORAL | Status: DC
  Administered 2012-10-25 – 2012-10-28 (×4): 250 mg via ORAL
  Filled 2012-10-25 (×4): qty 1

## 2012-10-25 MED ORDER — LISINOPRIL 10 MG PO TABS
10.0000 mg | ORAL_TABLET | Freq: Every day | ORAL | Status: DC
  Administered 2012-10-25 – 2012-10-28 (×4): 10 mg via ORAL
  Filled 2012-10-25 (×4): qty 1

## 2012-10-25 MED ORDER — CLOPIDOGREL BISULFATE 75 MG PO TABS
75.0000 mg | ORAL_TABLET | Freq: Every day | ORAL | Status: DC
  Administered 2012-10-25 – 2012-10-28 (×4): 75 mg via ORAL
  Filled 2012-10-25 (×5): qty 1

## 2012-10-25 NOTE — ED Provider Notes (Signed)
I saw and evaluated the patient, reviewed the resident's note and I agree with the findings and plan.   .Face to face Exam:  General:  Awake HEENT:  Atraumatic Resp:  Normal effort Abd:  Nondistended Neuro:No focal weakness    CRITICAL CARE Performed by: Nelva Nay L Total critical care time:30 min Critical care time was exclusive of separately billable procedures and treating other patients. Critical care was necessary to treat or prevent imminent or life-threatening deterioration. Critical care was time spent personally by me on the following activities: development of treatment plan with patient and/or surrogate as well as nursing, discussions with consultants, evaluation of patient's response to treatment, examination of patient, obtaining history from patient or surrogate, ordering and performing treatments and interventions, ordering and review of laboratory studies, ordering and review of radiographic studies, pulse oximetry and re-evaluation of patient's condition.   Nelia Shi, MD 10/25/12 [phone]

## 2012-10-25 NOTE — Progress Notes (Addendum)
Subjective:   56 y/o with obesity, DM, HTN, CAD s/p CABG admitted yesterday with inferior STEMI in setting of fevers (103 degrees) and productive cough. Troponin 8.4. CXR negative  Cath showed:  Normal LV function with an ejection fraction of 60-65% without segmental wall motion abnormalities.  Significant native CAD with  LAD occluded proximally  LCX  70% proximal 90% distally the distal AV groove and PLA like distal vessel  RCA  total proximal occlusion with bridging collaterals.  SVG- PDA and PLA : occluded  Patent radial graft which supplies left circumflex marginal territory and gives rise to 3 branches and collateralizes distal RCA and possible diagonal vessel  Patent LIMA to LAD with filling retrograde back up to the point of proximal occlusion, the distal LAD isvery diminutive and has diffuse narrowing with collateralization to the distal RCA.   Treated medically. Denies CP or dyspnea this am. No cough.  On aztithromycin. Sats 98% RA   Intake/Output Summary (Last 24 hours) at 10/25/12 1140 Last data filed at 10/25/12 0600  Gross per 24 hour  Intake 1506.5 ml  Output   2225 ml  Net -718.5 ml    Current meds: . aspirin EC  81 mg Oral Daily  . atorvastatin  80 mg Oral q1800  . azithromycin (ZITHROMAX) 500 MG IVPB  500 mg Intravenous Q24H  . azithromycin (ZITHROMAX) 500 MG IVPB  500 mg Intravenous Once  . [START ON 10/27/2012] azithromycin  500 mg Oral Daily  . heparin  5,000 Units Subcutaneous Q8H  . insulin aspart  0-15 Units Subcutaneous TID WC  . metoprolol tartrate  25 mg Oral BID  . sodium chloride  3 mL Intravenous Q12H   Infusions: . sodium chloride 125 mL/hr at 10/24/12 1730  . nitroGLYCERIN 55 mcg/min (10/25/12 0600)     Objective:  Blood pressure 154/75, pulse 97, temperature 98.4 F (36.9 C), temperature source Oral, resp. rate 22, height 6\' 1"  (1.854 m), weight 109.4 kg (241 lb 2.9 oz), SpO2 95.00%. Weight change:   Physical Exam: General:  Well  appearing. No resp difficulty HEENT: normal Neck: supple. JVP appears flat . Carotids 2+ bilat; no bruits. No lymphadenopathy or thryomegaly appreciated. Cor: PMI nondisplaced. Regular rate & rhythm. No rubs, gallops or murmurs. Lungs: clear Abdomen: soft, nontender, nondistended. No hepatosplenomegaly. No bruits or masses. Good bowel sounds. Extremities: no cyanosis, clubbing, rash, edema Neuro: alert & orientedx3, cranial nerves grossly intact. moves all 4 extremities w/o difficulty. Affect pleasant  Telemetry: SR  Lab Results: Basic Metabolic Panel:  Recent Labs Lab 10/24/12 1530 10/25/12 0820  NA 132* 133*  K 3.7 4.1  CL 97 99  CO2 24 25  GLUCOSE 121* 138*  BUN 12 14  CREATININE 1.02 1.17  CALCIUM 8.8 8.2*   Liver Function Tests:  Recent Labs Lab 10/24/12 1830  AST 25  ALT 16  ALKPHOS 80  BILITOT 1.1  PROT 6.3  ALBUMIN 2.9*   No results found for this basename: LIPASE, AMYLASE,  in the last 168 hours No results found for this basename: AMMONIA,  in the last 168 hours CBC:  Recent Labs Lab 10/24/12 1530  WBC 13.3*  NEUTROABS 12.0*  HGB 14.7  HCT 40.5  MCV 81.2  PLT 165   Cardiac Enzymes:  Recent Labs Lab 10/24/12 1530 10/24/12 1830 10/25/12 0050 10/25/12 0820  TROPONINI <0.30 7.51* 8.57* 8.40*   BNP: No components found with this basename: POCBNP,  CBG:  Recent Labs Lab 10/24/12 2244 10/25/12 0801  GLUCAP 197* 135*   Microbiology: No results found for this basename: cult   No results found for this basename: CULT, SDES,  in the last 168 hours  Imaging: Dg Chest Portable 1 View  10/24/2012   CLINICAL DATA:  Tremors  EXAM: PORTABLE CHEST - 1 VIEW  COMPARISON:  12/29/2007  FINDINGS: Changes from CABG surgery are stable. The cardiac silhouette is mildly enlarged. The mediastinum is normal in contour.  The lungs are clear.  The bony thorax is grossly intact.  IMPRESSION: No acute cardiopulmonary disease.   Electronically Signed   By: Amie Portland   On: 10/24/2012 18:26     ASSESSMENT:  1. Inferior STEMI - medical treatment 2. CAD s/p CABG 3. Acute bronchitis 4. HTN 5. DM2 6. Hyponatremia  PLAN/DISCUSSION:  He is much improved today. Suspect acute bronchitis may have been triggering event. Will treat CAD medically with ASA, plavix, b-blocker, statin. Have cardiac rehab see.   CXR negative for PNA so suspect bronchitis. Seems to be responding well to azithro but would have low threshold to switch to quinolone as needed.   Can go to tele. Possibly home in am  He tells me he has been off his medicines due to finances. He needs to apply for Medicare/Medicaid. Will consult CM to help with meds.   Ideally would use:  1. Asa 81 2. Plavix 75 3. Carvedilol 6.25 bid 4. Lisinopril 10 daily 5. Metformin 500 bid 6. Atorva 80 daily   LOS: 1 day  Arvilla Meres, MD 10/25/2012, 11:40 AM

## 2012-10-26 DIAGNOSIS — E785 Hyperlipidemia, unspecified: Secondary | ICD-10-CM

## 2012-10-26 LAB — GLUCOSE, CAPILLARY
Glucose-Capillary: 117 mg/dL — ABNORMAL HIGH (ref 70–99)
Glucose-Capillary: 180 mg/dL — ABNORMAL HIGH (ref 70–99)
Glucose-Capillary: 139 mg/dL — ABNORMAL HIGH (ref 70–99)
Glucose-Capillary: 147 mg/dL — ABNORMAL HIGH (ref 70–99)

## 2012-10-26 LAB — URINE CULTURE

## 2012-10-26 MED ORDER — METOPROLOL TARTRATE 1 MG/ML IV SOLN: 2.5000 mg | INTRAVENOUS | Status: DC | PRN

## 2012-10-26 MED ORDER — CARVEDILOL 6.25 MG PO TABS
6.2500 mg | ORAL_TABLET | ORAL | Status: AC
  Administered 2012-10-26: 6.25 mg via ORAL
  Filled 2012-10-26: qty 1

## 2012-10-26 MED ORDER — LIVING WELL WITH DIABETES BOOK
Freq: Once | Status: AC
  Administered 2012-10-26: 10:00:00
  Filled 2012-10-26: qty 1

## 2012-10-26 MED ORDER — CARVEDILOL 12.5 MG PO TABS
12.5000 mg | ORAL_TABLET | Freq: Two times a day (BID) | ORAL | Status: DC
  Administered 2012-10-26 – 2012-10-28 (×4): 12.5 mg via ORAL
  Filled 2012-10-26 (×6): qty 1

## 2012-10-26 NOTE — Progress Notes (Signed)
Cardiac rehab nurse made this RN aware that pt's BP prior to walk 180's/100, and while ambulating HR 140's. After walk BP 220/100. After a few minutes of rest BP came down to 170's/80's and HR low 100's. Notified NP of this information. Will give ordered medication.

## 2012-10-26 NOTE — Progress Notes (Signed)
Pt BP up with ambulation, 220/110, HR briefly 140s but has improved.   Current BP 190/90, HR 90s.  Coreg increased today but has not been given yet.   Wrote for PRN Lopressor IV for elevated HR/BP.

## 2012-10-26 NOTE — Progress Notes (Signed)
Subjective: No CP  No SOB Objective: Filed Vitals:   10/25/12 1840 10/25/12 1915 10/25/12 1945 10/26/12 0457  BP:   153/78 145/83  Pulse:   99 93  Temp: 101 F (38.3 C) 99.5 F (37.5 C) 99.2 F (37.3 C) 99.8 F (37.7 C)  TempSrc: Oral Oral Oral Oral  Resp:   18 18  Height:      Weight:    240 lb (108.863 kg)  SpO2:   99% 92%   Weight change: 5 lb (2.268 kg)  Intake/Output Summary (Last 24 hours) at 10/26/12 0734 Last data filed at 10/26/12 0300  Gross per 24 hour  Intake  229.5 ml  Output    850 ml  Net -620.5 ml    General: Alert, awake, oriented x3, in no acute distress Neck:  JVP is normal Heart: Regular rate and rhythm, without murmurs, rubs, gallops.  Lungs: Clear to auscultation.  No rales or wheezes. Exemities:  No edema.   Neuro: Grossly intact, nonfocal.  Tele:  SR  Short burst of SVT Lab Results: Results for orders placed during the hospital encounter of 10/24/12 (from the past 24 hour(s))  GLUCOSE, CAPILLARY     Status: Abnormal   Collection Time    10/25/12  8:01 AM      Result Value Range   Glucose-Capillary 135 (*) 70 - 99 mg/dL  TROPONIN I     Status: Abnormal   Collection Time    10/25/12  8:20 AM      Result Value Range   Troponin I 8.40 (*) <0.30 ng/mL  LIPID PANEL     Status: Abnormal   Collection Time    10/25/12  8:20 AM      Result Value Range   Cholesterol 146  0 - 200 mg/dL   Triglycerides 82  <811 mg/dL   HDL 29 (*) >91 mg/dL   Total CHOL/HDL Ratio 5.0     VLDL 16  0 - 40 mg/dL   LDL Cholesterol 478 (*) 0 - 99 mg/dL  BASIC METABOLIC PANEL     Status: Abnormal   Collection Time    10/25/12  8:20 AM      Result Value Range   Sodium 133 (*) 135 - 145 mEq/L   Potassium 4.1  3.5 - 5.1 mEq/L   Chloride 99  96 - 112 mEq/L   CO2 25  19 - 32 mEq/L   Glucose, Bld 138 (*) 70 - 99 mg/dL   BUN 14  6 - 23 mg/dL   Creatinine, Ser 2.95  0.50 - 1.35 mg/dL   Calcium 8.2 (*) 8.4 - 10.5 mg/dL   GFR calc non Af Amer 68 (*) >90 mL/min   GFR  calc Af Amer 79 (*) >90 mL/min  GLUCOSE, CAPILLARY     Status: Abnormal   Collection Time    10/25/12 11:44 AM      Result Value Range   Glucose-Capillary 137 (*) 70 - 99 mg/dL  GLUCOSE, CAPILLARY     Status: Abnormal   Collection Time    10/25/12  8:50 PM      Result Value Range   Glucose-Capillary 180 (*) 70 - 99 mg/dL  GLUCOSE, CAPILLARY     Status: Abnormal   Collection Time    10/26/12  6:11 AM      Result Value Range   Glucose-Capillary 139 (*) 70 - 99 mg/dL    Studies/Results: @RISRSLT24 @  Medications: Reviewed   @PROBHOSP @  1.  STEMI.  Cath on 9/27 with signif CAD (100%pLAD; 70%prox LCx; 90% distal LCx  100% prox RCA ; SVG to PDA/PLSA 100%:  Radial artery to Lcx  Patent  Gives collaterals to R; [place] to LAD patent   Flills LAD retrograde  Distal LAD diffuse disease:  Plan for medical Rx.  Plan to have patient ambulate  Follow  Possible D/C later today  2.  Bronchitis  Po ABX  3.  HTN  BP is still up  Inrease coreg COntinue lisinopril  4.  DM2  5  HL  Continue statin 80 lipitor  WIll have cardiac rehab see  Also speak with SW re meds.    LOS: 2 days   Dietrich Pates 10/26/2012, 7:34 AM

## 2012-10-26 NOTE — Clinical Documentation Improvement (Signed)
THIS DOCUMENT IS NOT A PERMANENT PART OF THE MEDICAL RECORD  Please update your documentation with the medical record to reflect your response to this query. If you need help knowing how to do this please call [phone].  10/26/12   Dear [name] / Associates,  In a better effort to capture your patient's severity of illness, reflect appropriate length of stay and utilization of resources, a review of the patient medical record has revealed the following indicators.    Based on your clinical judgment, please clarify and document in a progress note and/or discharge summary the clinical condition associated with the following supporting information:   Possible Clinical Conditions?  "   Accelerated Hypertension  "   Malignant Hypertension  "   Other Condition  "   Cannot Clinically Determine      Risk Factors:  Suspect hypertension uncontrolled at home noted per 9/27 progress notes. BP 209/96 during 9/27 cath procedure; IV Nitro titrated up to ; also received several doses of 2.5mg  IV Lopressor during procedure. BP 220/100 per 9/29 doc flow sheet.     You may use possible, probable, or suspect with inpatient documentation. possible, probable, suspected diagnoses MUST be documented at the time of discharge  Reviewed: additional documentation in the medical record  Thank You,  Marciano Sequin,  Clinical Documentation Specialist: [phone] Health Information Management 

## 2012-10-26 NOTE — Progress Notes (Signed)
Inpatient Diabetes Program Recommendations  AACE/ADA: New Consensus Statement on Inpatient Glycemic Control (2013)  Target Ranges:  Prepandial:   less than 140 mg/dL      Peak postprandial:   less than 180 mg/dL (1-2 hours)      Critically ill patients:  140 - 180 mg/dL  Results for AJAMU, MAXON (MRN [id number]) as of 10/26/2012 13:57  Ref. Range 10/25/2012 08:01 10/25/2012 11:44 10/25/2012 20:50 10/26/2012 06:11 10/26/2012 11:07  Glucose-Capillary Latest Range: 70-99 mg/dL 413 (H) 244 (H) 010 (H) 139 (H) 147 (H)   Inpatient Diabetes Program Recommendations Oral Agents: consider Metformin  HgbA1C: =7    Diabetes Coordinator met with patient to discuss prior diabetes treatment.  Patient reports that he was on an oral medication approximately five years ago but has not been taking it since his heart surgery.  He reports his MD took him off of it prior to surgery.   Patient is still within recommended goal of A1C <7 but could consider adding Metformin if no contraindications.   Basic inpatient education has been initiated.  Bedside RN to assist patient with viewing the diabetes videos on the patient ed network.  Living Well with Diabetes patient education workbook at bedside.   Thank you  Piedad Climes BSN, RN,CDE Inpatient Diabetes Coordinator [phone] (team pager)

## 2012-10-26 NOTE — Progress Notes (Addendum)
CARDIAC REHAB PHASE I   PRE:  Rate/Rhythm: 100 SR  BP:  Supine: 184/100  Sitting:   Standing:    SaO2: 98 RA  MODE:  Ambulation: 500 ft   POST:  Rate/Rhythm: 141 ST  BP:  Supine:   Sitting: 220/100  Standing:    SaO2: 97 RA 0930-1040 On arrival pt in bed without c/o BP 184/100. Assisted X 1 to ambulate. Gait steady with walking. Pt does c/o of calf tightness with walking, denies any cp or SOB. BP after walk 220/100 HR 141 ST. Pt to recliner after walk with call light in reach. Completed MI education with pt. He voices understanding. Pt declines Outpt. CRP, not interested. Continued to check pt's BP as he rested in recliner, 190/90, 170/86 with HR 98 SR as I left room. Reported BP and HR to RN. Pt admits to a learning disability, but did great with teach back from MI education.  Melina Copa RN 10/26/2012 10:40 AM

## 2012-10-27 ENCOUNTER — Encounter: Payer: Self-pay | Admitting: Cardiovascular Disease

## 2012-10-27 LAB — GLUCOSE, CAPILLARY
Glucose-Capillary: 147 mg/dL — ABNORMAL HIGH (ref 70–99)
Glucose-Capillary: 164 mg/dL — ABNORMAL HIGH (ref 70–99)
Glucose-Capillary: 153 mg/dL — ABNORMAL HIGH (ref 70–99)
Glucose-Capillary: 217 mg/dL — ABNORMAL HIGH (ref 70–99)

## 2012-10-27 NOTE — Progress Notes (Signed)
CARDIAC REHAB PHASE I   PRE:  Rate/Rhythm: 78 SR  BP:  Supine: 140/62  Sitting:   Standing:    SaO2: 98 RA  MODE:  Ambulation: 250 ft   POST:  Rate/Rhythm: 87 SR  BP:  Supine:   Sitting: 158/80  Standing:    SaO2: 98 RA 1022-1055 On arrival pt in bed. Assisted X 1 to ambulate. Gait steady when starting then c/o of calf and buttock pain both sides. Pt became unsteady as his pain worsened. He kept taking standing rest stops. At 250 feet pt ask for wheelchair to be rolled back to room. Rolled pt back to room in w/c. Pt's BP and HR better today, but he was only able to walk one half of the distance he did yesterday.Pt seems not connected today. He would not always answer questions that I would ask him today, but would on second attempt. Pt to recliner after walk with call light in reach. Reported to pt's RN   Melina Copa RN 10/27/2012 10:49 AM

## 2012-10-27 NOTE — Care Management Note (Unsigned)
    Page 1 of 1   10/27/2012     1:43:13 PM   CARE MANAGEMENT NOTE 10/27/2012  Patient:  Darrell [name], Darrell [name]   Account Number:  [id number]  Date Initiated:  10/27/2012  Documentation initiated by:  Lynea Rollison  Subjective/Objective Assessment:   PT ADM ON 10/24/12 WITH BRONCHITIS, STEMI.  PTA, PT INDEPENDENT, LIVES WITH SPOUSE.     Action/Plan:   PT STATES HE HAS NOT TAKEN MEDS IN YEARS DUE TO FINANCIAL SITUATION.  HE REPORTS HE HAS LET HIS MEDICAID LAPSE. FINANCIAL COUNSELOR FOLLOWING TO ASSIST WITH REAPPLYING.   Anticipated DC Date:  10/28/2012   Anticipated DC Plan:  HOME/SELF CARE      DC Planning Services  CM consult  MATCH Program  PCP issues      Choice offered to / List presented to:             Status of service:  In process, will continue to follow Medicare Important Message given?   (If response is "NO", the following Medicare IM given date fields will be blank) Date Medicare IM given:   Date Additional Medicare IM given:    Discharge Disposition:    Per UR Regulation:  Reviewed for med. necessity/level of care/duration of stay  If discussed at Long Length of Stay Meetings, dates discussed:    Comments:  10/27/12 Diamond Jentz,RN,BSN [phone] PT IS ELIGIBLE FOR MEDICATION ASSISTANCE THROUGH [facility]ATCH [facility].   WILL PROVIDE MATCH LETTER WHICH WILL ALLOW PT TO GET ALL DC MEDS FOR $3/RX FROM RETAIL PHARMACY.  PT CURRENTLY HAS NO PCP, AND IS AGREEABLE TO FOLLOWING UP AT [facility].  WILL SCHEDULE FOLLOW UP APPT.  APPT MADE FOR OCTOBER 28TH AT 10:00AM; INFORMATION PLACED ON AVS.

## 2012-10-28 ENCOUNTER — Encounter (HOSPITAL_COMMUNITY): Payer: Self-pay | Admitting: Nurse Practitioner

## 2012-10-28 ENCOUNTER — Telehealth: Payer: Self-pay | Admitting: Nurse Practitioner

## 2012-10-28 DIAGNOSIS — I2119 ST elevation (STEMI) myocardial infarction involving other coronary artery of inferior wall: Secondary | ICD-10-CM

## 2012-10-28 DIAGNOSIS — I739 Peripheral vascular disease, unspecified: Secondary | ICD-10-CM

## 2012-10-28 LAB — GLUCOSE, CAPILLARY
Glucose-Capillary: 140 mg/dL — ABNORMAL HIGH (ref 70–99)
Glucose-Capillary: 184 mg/dL — ABNORMAL HIGH (ref 70–99)

## 2012-10-28 MED ORDER — NITROGLYCERIN 0.4 MG SL SUBL: 0.4000 mg | SUBLINGUAL_TABLET | SUBLINGUAL | Status: DC | PRN

## 2012-10-28 MED ORDER — LISINOPRIL 20 MG PO TABS: 20.0000 mg | ORAL_TABLET | Freq: Every day | ORAL | Status: DC

## 2012-10-28 MED ORDER — ATORVASTATIN CALCIUM 80 MG PO TABS: 80.0000 mg | ORAL_TABLET | Freq: Every day | ORAL | Status: DC

## 2012-10-28 MED ORDER — CARVEDILOL 12.5 MG PO TABS: 12.5000 mg | ORAL_TABLET | Freq: Two times a day (BID) | ORAL | Status: DC

## 2012-10-28 MED ORDER — INFLUENZA VAC SPLIT QUAD 0.5 ML IM SUSP
0.5000 mL | INTRAMUSCULAR | Status: AC
  Administered 2012-10-28: 0.5 mL via INTRAMUSCULAR
  Filled 2012-10-28: qty 0.5

## 2012-10-28 MED ORDER — AZITHROMYCIN 250 MG PO TABS: 250.0000 mg | ORAL_TABLET | Freq: Every day | ORAL | Status: DC

## 2012-10-28 MED ORDER — CLOPIDOGREL BISULFATE 75 MG PO TABS: 75.0000 mg | ORAL_TABLET | Freq: Every day | ORAL | Status: DC

## 2012-10-28 MED ORDER — ASPIRIN 81 MG PO TBEC: 81.0000 mg | DELAYED_RELEASE_TABLET | Freq: Every day | ORAL | Status: DC

## 2012-10-28 MED ORDER — FREESTYLE SYSTEM KIT: 1.0000 | PACK | Freq: Once | Status: DC

## 2012-10-28 MED ORDER — METFORMIN HCL 500 MG PO TABS: 500.0000 mg | ORAL_TABLET | Freq: Every day | ORAL | Status: DC

## 2012-10-28 NOTE — Progress Notes (Signed)
Patient Name: Darrell [name] Date of Encounter: 10/28/2012     Principal Problem:   Acute bronchitis Active Problems:   DIABETES MELLITUS, TYPE II   HYPERTENSION, BENIGN ESSENTIAL   CAD   HYPERCHOLESTEROLEMIA   GERD    SUBJECTIVE  Patient has had an uneventful 24 hours. No complaints of CP, SOB, palps, orthopnea, cough, or abdominal pain (presenting symptom). Has been mostly inactive lying in bed but did ambulate a short distance with Cardiac Rehab, at which time he experienced bilateral calf and buttock pain. Relieved with rest.   CURRENT MEDS . aspirin EC  81 mg Oral Daily  . atorvastatin  80 mg Oral q1800  . azithromycin  250 mg Oral Daily  . carvedilol  12.5 mg Oral BID WC  . clopidogrel  75 mg Oral Q breakfast  . heparin  5,000 Units Subcutaneous Q8H  . [START ON 10/29/2012] influenza vac split quadrivalent PF  0.5 mL Intramuscular Tomorrow-1000  . insulin aspart  0-15 Units Subcutaneous TID WC  . lisinopril  10 mg Oral Daily  . sodium chloride  3 mL Intravenous Q12H    OBJECTIVE  Filed Vitals:   10/27/12 1635 10/27/12 1745 10/27/12 2018 10/28/12 0603  BP: 160/89  140/69 154/87  Pulse: 80  86 71  Temp:  101.1 F (38.4 C) 99.4 F (37.4 C) 98.3 F (36.8 C)  TempSrc:   Oral Oral  Resp:   20 18  Height:    6\' 1"  (1.854 m)  Weight:    238 lb 15.7 oz (108.4 kg)  SpO2:   97% 99%    Intake/Output Summary (Last 24 hours) at 10/28/12 0834 Last data filed at 10/28/12 0805  Gross per 24 hour  Intake    720 ml  Output      0 ml  Net    720 ml   Filed Weights   10/26/12 0457 10/27/12 0459 10/28/12 0603  Weight: 240 lb (108.863 kg) 241 lb 4.8 oz (109.453 kg) 238 lb 15.7 oz (108.4 kg)    PHYSICAL EXAM  General: Pleasant, NAD. Neuro: Alert and oriented X 3. Moves all extremities spontaneously. Psych: Normal affect. HEENT:  Normal  Neck: No bruits or JVD. Lungs:  Resp regular and unlabored, no adventitious lung sounds. Heart: RRR no s3, s4, or  murmurs. Abdomen: Soft, non-tender, non-distended, BS + x 4.  Extremities: No edema. DP/PT/Radials 2+ and equal bilaterally.  Accessory Clinical Findings  No labs  TELE  NSR with occasional episodes of ventricular couplets and occasional PACs.   Radiology/Studies  Dg Chest Portable 1 View  10/24/2012   CLINICAL DATA:  Tremors  EXAM: PORTABLE CHEST - 1 VIEW  COMPARISON:  12/29/2007  FINDINGS: Changes from CABG surgery are stable. The cardiac silhouette is mildly enlarged. The mediastinum is normal in contour.  The lungs are clear.  The bony thorax is grossly intact.  IMPRESSION: No acute cardiopulmonary disease.   Electronically Signed   By: Amie Portland   On: 10/24/2012 18:26   Cath showed:  Normal LV function with an ejection fraction of 60-65% without segmental wall motion abnormalities.  Significant native CAD with  LAD occluded proximally  LCX 70% proximal 90% distally the distal AV groove and PLA like distal vessel  RCA total proximal occlusion with bridging collaterals.  SVG- PDA and PLA : occluded  Patent radial graft which supplies left circumflex marginal territory and gives rise to 3 branches and collateralizes distal RCA and possible diagonal vessel  Patent  LIMA to LAD with filling retrograde back up to the point of proximal occlusion, the distal LAD isvery diminutive and has diffuse narrowing with collateralization to the distal RCA.     ASSESSMENT AND PLAN  57 year old male with known CAD, obesity, DM and HTN who presented with inferior wall STEMI.  1. Coronary artery disease - History of CAD with CABG 2009 (LIMA-LAD, SVG-diag, SVG-PDA and PL, left radial-OM) and stent to RCA and RI in 2012. Patient presented to ED 4 days ago with abdominal pain, cough, and chills. While undergoing infectious workup he mentioned CP, hadn't had any CP up to this point. EKG done at that time showed acute inferior ST elevations. Trop 8.40. Emergent cath performed, results as above. No  intervention done, rec medical treatment. Has been started on ASA 81 mg, Plavix 75 mg, Carvedilol 12.5 mg BID, Lisinopril 10 mg, and Atorvastatin 80 mg which he will be D/C on to home. Outpt cardiac referral given but patient declined. He had previously not been on any medications at home since his CABG in 2009 2/2 financial issues. SW obtained approval for pt medication assistance through [facility]. Previously had no PCP, has been scheduled for f/u at [facility] on 10/28.   2. HTN - He had not been on any BP meds at home prior to this episode. BPs in hospital have been running in the 150-160s/80-90s. He has also had spikes into the 200s while ambulating with cardiac rehab. Started on Carvediolol 12.5 mg BID and Lisinopril 10mg .  Titrate Acei to 20mg  daily.  Will need close outpt f/u and compliance.   3. Claudication - Has experienced bilateral buttock and calf cramping while ambulating short distance with cardiac rehab which resolved with cessation of activity. He has never had any discomfort in his legs with rest.  Will arrange for outpt ABI's.  Cont asa/statin.  4. Acute Bronchitis - Chest XRay at admission showed no acute cardiopulmonary process. Azithromycin 250 mg started and will be continued as outpt.   5. Type II Diabetes Mellitus - Hgb A1C 7.0 at admission. No DM agent will be started upon discharge. PCP f/u has been scheduled for 10/28.   6. Hyperlipidemia - Started on Atorvastatin 80 mg.   Signed, [name] NP  I have personally seen and examined this patient with [name], NP. I agree with the assessment and plan as outlined above. He is stable this am. No chest pain or SOB. Plan for medical management of CAD (see cath report by Dr. Nicholaus Bloom). He will be discharged home today. He will f/u long term with DR. Hochrein but appt next week with [name], NP. He has been non-compliant with all meds in the past. He is encouraged to take all  cardiac/BP medications. He agrees with plan.   [name] 10/28/2012 10:29 AM

## 2012-10-28 NOTE — Progress Notes (Signed)
CSW was contacted to aid with transportation for Pt via taxi cab.  CSW met with Pt to confirm travel plans.  CSW cleared with [employer] Work Insurance account manager.  CSW called completed taxi form and gave form to RN who will call taxi driver.   CSW reported plans to nursing staff.  CSW signing off.  Maree Krabbe, MSW, Theresia Majors [phone]

## 2012-10-28 NOTE — Discharge Summary (Signed)
See full note this am. cdm 

## 2012-10-28 NOTE — Progress Notes (Signed)
Pt/family given discharge instructions, medication lists, follow up appointments, and when to call the doctor.  Pt/family verbalizes understanding. Pt/wife aware of need to pick up medication from outpatient pharmacy.  Pt/wife state they had a ride yesterday and now they do not.  Pt states he will not be able to pick up medication until Friday due to transportation and awaiting his check.  Will continue to monitor. Thomas Hoff

## 2012-10-28 NOTE — Progress Notes (Signed)
Pt awaiting transportation for discharge.  Wife says she needs to call someone.  I asked to be kept informed as to time of discharge. Will give instructions and await transport.  Will continue to monitor. Thomas Hoff

## 2012-10-28 NOTE — Discharge Summary (Signed)
Patient ID: AVERIE MEINER,  MRN: [id number], DOB/AGE: April 16, 1955 57 y.o.  Admit date: 10/24/2012 Discharge date: 10/28/2012  Primary Care Provider: [facility] Primary Cardiologist: J. Hochrein, MD   Discharge Diagnoses Principal Problem:   ST elevation myocardial infarction (STEMI) of inferior wall, initial episode of care  **s/p catheterization this admission revealing 2/5 patent grafts-->Med Rx.  Active Problems:   Acute bronchitis  **Abx initiated this admission.   DIABETES MELLITUS, TYPE II   HYPERTENSION, BENIGN ESSENTIAL   CAD   HYPERCHOLESTEROLEMIA   GERD   Claudication   Noncompliance  Allergies No Known Allergies  Procedures  Cardiac Catheterization 9.27.2014  HEMODYNAMICS:    Central Aorta: Initial central aortic pressure is 202/119. Blood pressure did improve to 180/90 on left ventricular pullback.  Left Ventricle:  180/21  ANGIOGRAPHY:  1. Left main: Short vessel which bifurcated into an LAD and left circumflex system. 2. LAD: Totally occluded proximally after a small proximal septal perforating artery. 3. Left circumflex: Dominant vessel. The first marginal had diffuse 95% proximal stenoses. A 70% stenosis proximally the groove. It appear to be a stent in the mid AV groove segment after several marginal after a bifurcating marginal vessel. 90% distally the groove circumflex diffuse 90% narrowing in the distal PLA branch like vessel the circumflex.   4. Right coronary artery: Totally occluded in its proximal segment. There did appear to be at a diffuse disease in the midsegment which had low via bridging collaterals. The distal RCA was visualized antegrade, there were collaterals supplying the distal RCA from collaterals via the LIMA to LAD injection as well as the radial graft to circumflex marginal injections. 5.LIMA TO LAD: Widely patent. The LAD filled retrograde up to the anterior point of proximal occlusion. There  were several proximal lock moderate size septal perforating arteries. The distal LAD was diffusely diseased and had diffuse 95% stenoses and very small caliber vessel. 6. Radial graft to the obtuse marginal vessel was widely patent. 3 blanches seemed to arise from this radial to marginal insertion. There also is a collateral supply the distal RCA as well as collateralization to what appeared to perhaps be a diagonal vessel.  7. SVG to right artery which apparently was a sequential graft was occluded at its origin. Only 2  markers were present in the aorta, and it did not appear that there was another vein grafts arising from the aorta supplying a diagonal vessel  Left ventriculography revealed normal LV contractility with ejection fraction of at least 60-65%. There did appear to be left ventricular hypertrophy. _____________   History of Present Illness  57 year old male with prior history of coronary artery disease status post coronary artery bypass grafting in 2009. He has been lost to followup since. He presented to the  on September 27 with complaints of right lower abdominal discomfort and also dyspnea and congestion. There was concern for possible pneumonia versus bronchitis. While being worked up in the emergency room, patient developed chest pain and an ECG was performed showing inferior ST segment elevation with high lateral changes as well. A code STEMI was called and the patient was taken to the cardiac catheterization laboratory emergently.  Hospital Course  Patient underwent emergent diagnostic cardiac catheterization with results as outlined above. He was found to have severe native three-vessel coronary artery disease with 2 of 5 patent grafts. The sequential vein graft to the PDA and PL was occluded while the vein graft to the diagonal was not  found. Medical therapy was recommended and patient was transferred to the coronary intensive care unit where he eventually peaked his  troponin at 8.57. As he had previously been on no medications, aspirin, high potency statin, Plavix, beta blocker, and ACE inhibitor therapy were initiated. Patient was markedly hypertensive and beta blocker and ACE inhibitor were subsequently titrated further. He had no further chest pain however continued to have upper respiratory complaints and was placed on azithromycin therapy for management of presumed acute bronchitis.   With medical therapy, including antibiotics, patient has had improvement in dyspnea. He has been found to have elevated blood glucoses and hemoglobin A1c of 7.0. He has been treated with sliding-scale insulin while hospitalized and will be transitioned to oral metformin at discharge. He has shown lack of insight into his medical condition and is also reported lack of finances to afford followup and medications at home. He has been seen by social work and arrangements have been made for outpatient followup the [facility]. He has also been enrolled in the [place] match program in order to receive his prescriptions from the outpatient pharmacy.  Finally, will ambulate with cardiac rehabilitation, patient has reported upper thigh and buttock pain. He has been noted to have normal lower extremity pulses. We have arranged for outpatient arterial brachial indices. He will be discharged home today in good condition with followup as outlined below.  Discharge Vitals Blood pressure 154/87, pulse 71, temperature 98.3 F (36.8 C), temperature source Oral, resp. rate 18, height 6\' 1"  (1.854 m), weight 238 lb 15.7 oz (108.4 kg), SpO2 99.00%.  Filed Weights   10/26/12 0457 10/27/12 0459 10/28/12 0603  Weight: 240 lb (108.863 kg) 241 lb 4.8 oz (109.453 kg) 238 lb 15.7 oz (108.4 kg)   Labs  CBC Lab Results  Component Value Date   WBC 13.3* 10/24/2012   HGB 14.7 10/24/2012   HCT 40.5 10/24/2012   MCV 81.2 10/24/2012   PLT 165 10/24/2012    Basic Metabolic  Panel Lab Results  Component Value Date   CREATININE 1.17 10/25/2012   BUN 14 10/25/2012   NA 133* 10/25/2012   K 4.1 10/25/2012   CL 99 10/25/2012   CO2 25 10/25/2012    Liver Function Tests Lab Results  Component Value Date   ALT 16 10/24/2012   AST 25 10/24/2012   ALKPHOS 80 10/24/2012   BILITOT 1.1 10/24/2012    Cardiac Enzymes Lab Results  Component Value Date   TROPONINI 8.40* 10/25/2012    Hemoglobin A1C Lab Results  Component Value Date   HGBA1C 7.0* 10/24/2012   Fasting Lipid Panel Lab Results  Component Value Date   CHOL 146 10/25/2012   HDL 29* 10/25/2012   LDLCALC 101* 10/25/2012   TRIG 82 10/25/2012   CHOLHDL 5.0 10/25/2012   Thyroid Function Tests Lab Results  Component Value Date   TSH 1.228 10/24/2012   Disposition  Pt is being discharged home today in good condition.  Follow-up Plans & Appointments  Follow-up Information   Follow up with [facility]     On 11/24/2012. (10:00am; please bring photo ID and all medications you are currently taking to your appt.  )    Contact information:   [address] [place] [address]       Follow up with [name], NP On 11/04/2012. (1:30 PM)    Specialty:  Nurse Practitioner   Contact information:   [address] [place] [address] [phone]       Follow up with [facility] On 11/02/2012. (2:00 PM - Measurement of blood pressures in legs.)    Contact information:   [address] [place] [address] [phone]     Discharge Medications    Medication List         aspirin 81 MG EC tablet  Take 1 tablet (81 mg total) by mouth daily.     atorvastatin 80 MG tablet  Commonly known as:  LIPITOR  Take 1 tablet (80 mg total) by mouth daily at 6 PM.     azithromycin 250 MG tablet  Commonly known as:  ZITHROMAX  Take 1 tablet (250 mg total) by mouth daily.     carvedilol 12.5 MG tablet  Commonly known as:  COREG  Take 1  tablet (12.5 mg total) by mouth 2 (two) times daily with a meal.     clopidogrel 75 MG tablet  Commonly known as:  PLAVIX  Take 1 tablet (75 mg total) by mouth daily with breakfast.     glucose monitoring kit monitoring kit  1 each by Does not apply route once. Glucometer with test strips and lancets to check blood glucose BID ac breakfast and dinner.     lisinopril 20 MG tablet  Commonly known as:  PRINIVIL,ZESTRIL  Take 1 tablet (20 mg total) by mouth daily.     metFORMIN 500 MG tablet  Commonly known as:  GLUCOPHAGE  Take 1 tablet (500 mg total) by mouth daily.     nitroGLYCERIN 0.4 MG SL tablet  Commonly known as:  NITROSTAT  Place 1 tablet (0.4 mg total) under the tongue every 5 (five) minutes as needed for chest pain.       Outstanding Labs/Studies  ABI's scheduled for next week.  F/u lipids/lft's in 8 wks.  Duration of Discharge Encounter   Greater than 30 minutes including physician time.  Signed, [name] NP 10/28/2012, 10:51 AM

## 2012-10-28 NOTE — Telephone Encounter (Signed)
New problem    [name]    Patient has a 7 days with Darrell [name] on  10/8 @ 1:30 pm

## 2012-10-28 NOTE — Progress Notes (Signed)
CARDIAC REHAB PHASE I   PRE:  Rate/Rhythm: 82 SR  BP:  Supine:   Sitting: 150/88  Standing:    SaO2: 99 RA  MODE:  Ambulation:  ft   POST:  Rate/Rhythm:   BP:  Supine:   Sitting:   Standing:    SaO2:  0855-0910 On arrival pt in bed without c/o. Attempted to get pt to ambulate in hall. He refused and could not talk him into trying. Pt's wife in room and she tried to get him to walk. He got angry and said that he was tired of all of this and that he was not going to walk. States that he will walk when he gets home.   Melina Copa RN 10/28/2012 9:10 AM

## 2012-10-29 NOTE — Telephone Encounter (Signed)
I attempted to call patient on home number - number not valid Mobile - wrong number per individual that answered Wife's home phone - not a valid number I do not see any other numbers for the patient.  Patient has an appointment with [name], NP on 10/8 @ 1:30.

## 2012-11-02 ENCOUNTER — Encounter (HOSPITAL_COMMUNITY): Payer: Self-pay

## 2012-11-04 ENCOUNTER — Encounter: Payer: Self-pay | Admitting: Nurse Practitioner

## 2012-11-10 ENCOUNTER — Encounter (HOSPITAL_COMMUNITY): Payer: Self-pay | Admitting: Cardiology

## 2012-11-24 ENCOUNTER — Ambulatory Visit: Payer: Self-pay | Admitting: Internal Medicine

## 2014-01-06 ENCOUNTER — Encounter (HOSPITAL_COMMUNITY): Payer: Self-pay | Admitting: Cardiovascular Disease

## 2014-03-25 ENCOUNTER — Inpatient Hospital Stay (HOSPITAL_COMMUNITY)
Admission: EM | Admit: 2014-03-25 | Discharge: 2014-03-28 | DRG: 078 | Disposition: A | Discharge status: Home / Self Care | Payer: Medicare Other | Attending: Internal Medicine | Admitting: Internal Medicine

## 2014-03-25 ENCOUNTER — Emergency Department (HOSPITAL_COMMUNITY)
Admission: EM | Admit: 2014-03-25 | Discharge: 2014-03-25 | Disposition: A | Discharge status: Home / Self Care | Payer: Medicare Other | Source: Home / Self Care | Attending: Emergency Medicine | Admitting: Emergency Medicine

## 2014-03-25 ENCOUNTER — Encounter (HOSPITAL_COMMUNITY): Payer: Self-pay | Admitting: *Deleted

## 2014-03-25 ENCOUNTER — Emergency Department (HOSPITAL_COMMUNITY): Payer: Medicare Other

## 2014-03-25 ENCOUNTER — Encounter (HOSPITAL_COMMUNITY): Payer: Self-pay | Admitting: Vascular Surgery

## 2014-03-25 DIAGNOSIS — R51 Headache: Principal | ICD-10-CM

## 2014-03-25 DIAGNOSIS — W19XXXA Unspecified fall, initial encounter: Secondary | ICD-10-CM | POA: Diagnosis present

## 2014-03-25 DIAGNOSIS — E7800 Pure hypercholesterolemia, unspecified: Secondary | ICD-10-CM | POA: Diagnosis present

## 2014-03-25 DIAGNOSIS — I674 Hypertensive encephalopathy: Principal | ICD-10-CM | POA: Diagnosis present

## 2014-03-25 DIAGNOSIS — R030 Elevated blood-pressure reading, without diagnosis of hypertension: Secondary | ICD-10-CM | POA: Diagnosis not present

## 2014-03-25 DIAGNOSIS — Z9119 Patient's noncompliance with other medical treatment and regimen: Secondary | ICD-10-CM | POA: Diagnosis not present

## 2014-03-25 DIAGNOSIS — Z79899 Other long term (current) drug therapy: Secondary | ICD-10-CM

## 2014-03-25 DIAGNOSIS — Z7982 Long term (current) use of aspirin: Secondary | ICD-10-CM

## 2014-03-25 DIAGNOSIS — R42 Dizziness and giddiness: Secondary | ICD-10-CM | POA: Diagnosis not present

## 2014-03-25 DIAGNOSIS — Z8249 Family history of ischemic heart disease and other diseases of the circulatory system: Secondary | ICD-10-CM

## 2014-03-25 DIAGNOSIS — R404 Transient alteration of awareness: Secondary | ICD-10-CM | POA: Diagnosis not present

## 2014-03-25 DIAGNOSIS — I10 Essential (primary) hypertension: Secondary | ICD-10-CM | POA: Diagnosis not present

## 2014-03-25 DIAGNOSIS — Z951 Presence of aortocoronary bypass graft: Secondary | ICD-10-CM

## 2014-03-25 DIAGNOSIS — R519 Headache, unspecified: Secondary | ICD-10-CM

## 2014-03-25 DIAGNOSIS — E119 Type 2 diabetes mellitus without complications: Secondary | ICD-10-CM

## 2014-03-25 DIAGNOSIS — I2510 Atherosclerotic heart disease of native coronary artery without angina pectoris: Secondary | ICD-10-CM | POA: Diagnosis not present

## 2014-03-25 DIAGNOSIS — I252 Old myocardial infarction: Secondary | ICD-10-CM

## 2014-03-25 DIAGNOSIS — Z91199 Patient's noncompliance with other medical treatment and regimen due to unspecified reason: Secondary | ICD-10-CM

## 2014-03-25 DIAGNOSIS — Z87891 Personal history of nicotine dependence: Secondary | ICD-10-CM

## 2014-03-25 DIAGNOSIS — R1110 Vomiting, unspecified: Secondary | ICD-10-CM | POA: Diagnosis not present

## 2014-03-25 DIAGNOSIS — Z955 Presence of coronary angioplasty implant and graft: Secondary | ICD-10-CM

## 2014-03-25 DIAGNOSIS — R778 Other specified abnormalities of plasma proteins: Secondary | ICD-10-CM | POA: Insufficient documentation

## 2014-03-25 DIAGNOSIS — I160 Hypertensive urgency: Secondary | ICD-10-CM | POA: Diagnosis present

## 2014-03-25 DIAGNOSIS — E118 Type 2 diabetes mellitus with unspecified complications: Secondary | ICD-10-CM

## 2014-03-25 DIAGNOSIS — E785 Hyperlipidemia, unspecified: Secondary | ICD-10-CM | POA: Diagnosis present

## 2014-03-25 DIAGNOSIS — I248 Other forms of acute ischemic heart disease: Secondary | ICD-10-CM | POA: Diagnosis not present

## 2014-03-25 DIAGNOSIS — Z7902 Long term (current) use of antithrombotics/antiplatelets: Secondary | ICD-10-CM

## 2014-03-25 DIAGNOSIS — Z9114 Patient's other noncompliance with medication regimen: Secondary | ICD-10-CM | POA: Diagnosis present

## 2014-03-25 DIAGNOSIS — I25110 Atherosclerotic heart disease of native coronary artery with unstable angina pectoris: Secondary | ICD-10-CM | POA: Diagnosis present

## 2014-03-25 DIAGNOSIS — R7989 Other specified abnormal findings of blood chemistry: Secondary | ICD-10-CM

## 2014-03-25 LAB — CBC WITH DIFFERENTIAL/PLATELET
BASOS PCT: 0 % (ref 0–1)
Basophils Absolute: 0 10*3/uL (ref 0.0–0.1)
EOS PCT: 2 % (ref 0–5)
Eosinophils Absolute: 0.1 10*3/uL (ref 0.0–0.7)
HCT: 41.2 % (ref 39.0–52.0)
Hemoglobin: 13.9 g/dL (ref 13.0–17.0)
LYMPHS PCT: 27 % (ref 12–46)
Lymphs Abs: 1.9 10*3/uL (ref 0.7–4.0)
MCH: 28.1 pg (ref 26.0–34.0)
MCHC: 33.7 g/dL (ref 30.0–36.0)
MCV: 83.2 fL (ref 78.0–100.0)
Monocytes Absolute: 0.4 10*3/uL (ref 0.1–1.0)
Monocytes Relative: 5 % (ref 3–12)
Neutro Abs: 4.5 10*3/uL (ref 1.7–7.7)
Neutrophils Relative %: 66 % (ref 43–77)
Platelets: 179 10*3/uL (ref 150–400)
RBC: 4.95 MIL/uL (ref 4.22–5.81)
RDW: 14.6 % (ref 11.5–15.5)
WBC: 6.8 10*3/uL (ref 4.0–10.5)

## 2014-03-25 LAB — I-STAT TROPONIN, ED
TROPONIN I, POC: 0.02 ng/mL (ref 0.00–0.08)
Troponin i, poc: 0.01 ng/mL (ref 0.00–0.08)

## 2014-03-25 LAB — COMPREHENSIVE METABOLIC PANEL
ALT: 16 U/L (ref 0–53)
AST: 15 U/L (ref 0–37)
Albumin: 3.3 g/dL — ABNORMAL LOW (ref 3.5–5.2)
Alkaline Phosphatase: 95 U/L (ref 39–117)
Anion gap: 9 (ref 5–15)
BUN: 13 mg/dL (ref 6–23)
CALCIUM: 8.4 mg/dL (ref 8.4–10.5)
CHLORIDE: 100 mmol/L (ref 96–112)
CO2: 26 mmol/L (ref 19–32)
Creatinine, Ser: 1.2 mg/dL (ref 0.50–1.35)
GFR calc Af Amer: 76 mL/min — ABNORMAL LOW (ref >90)
GFR, EST NON AFRICAN AMERICAN: 65 mL/min — AB (ref >90)
GLUCOSE: 145 mg/dL — AB (ref 70–99)
POTASSIUM: 3.6 mmol/L (ref 3.5–5.1)
SODIUM: 135 mmol/L (ref 135–145)
Total Bilirubin: 0.6 mg/dL (ref 0.3–1.2)
Total Protein: 7 g/dL (ref 6.0–8.3)

## 2014-03-25 LAB — I-STAT CHEM 8, ED
BUN: 10 mg/dL (ref 6–23)
CREATININE: 1.2 mg/dL (ref 0.50–1.35)
Calcium, Ion: 1.14 mmol/L (ref 1.12–1.23)
Chloride: 101 mmol/L (ref 96–112)
Glucose, Bld: 115 mg/dL — ABNORMAL HIGH (ref 70–99)
HEMATOCRIT: 42 % (ref 39.0–52.0)
Hemoglobin: 14.3 g/dL (ref 13.0–17.0)
Potassium: 3.9 mmol/L (ref 3.5–5.1)
SODIUM: 138 mmol/L (ref 135–145)
TCO2: 23 mmol/L (ref 0–100)

## 2014-03-25 LAB — CBG MONITORING, ED: GLUCOSE-CAPILLARY: 125 mg/dL — AB (ref 70–99)

## 2014-03-25 MED ORDER — ATORVASTATIN CALCIUM 80 MG PO TABS: 80.0000 mg | ORAL_TABLET | Freq: Every day | ORAL | Status: DC

## 2014-03-25 MED ORDER — HYDRALAZINE HCL 20 MG/ML IJ SOLN
10.0000 mg | INTRAMUSCULAR | Status: DC | PRN
  Administered 2014-03-25: 10 mg via INTRAVENOUS
  Filled 2014-03-25: qty 1

## 2014-03-25 MED ORDER — LABETALOL HCL 200 MG PO TABS
200.0000 mg | ORAL_TABLET | Freq: Once | ORAL | Status: AC
  Administered 2014-03-25: 200 mg via ORAL
  Filled 2014-03-25: qty 1

## 2014-03-25 MED ORDER — LISINOPRIL 20 MG PO TABS
20.0000 mg | ORAL_TABLET | Freq: Every day | ORAL | Status: DC
  Administered 2014-03-26 – 2014-03-28 (×3): 20 mg via ORAL
  Filled 2014-03-25 (×3): qty 1

## 2014-03-25 MED ORDER — OXYCODONE HCL 5 MG PO TABS: 5.0000 mg | ORAL_TABLET | ORAL | Status: DC | PRN

## 2014-03-25 MED ORDER — ONDANSETRON HCL 4 MG PO TABS: 4.0000 mg | ORAL_TABLET | Freq: Four times a day (QID) | ORAL | Status: DC | PRN

## 2014-03-25 MED ORDER — SODIUM CHLORIDE 0.9 % IJ SOLN
3.0000 mL | Freq: Two times a day (BID) | INTRAMUSCULAR | Status: DC
  Administered 2014-03-25 – 2014-03-28 (×6): 3 mL via INTRAVENOUS

## 2014-03-25 MED ORDER — ASPIRIN EC 81 MG PO TBEC
81.0000 mg | DELAYED_RELEASE_TABLET | Freq: Every day | ORAL | Status: DC
  Administered 2014-03-26 – 2014-03-28 (×3): 81 mg via ORAL
  Filled 2014-03-25 (×3): qty 1

## 2014-03-25 MED ORDER — INSULIN ASPART 100 UNIT/ML ~~LOC~~ SOLN
0.0000 [IU] | Freq: Three times a day (TID) | SUBCUTANEOUS | Status: DC
  Administered 2014-03-26 (×3): 2 [IU] via SUBCUTANEOUS
  Administered 2014-03-27: 1 [IU] via SUBCUTANEOUS
  Administered 2014-03-28: 2 [IU] via SUBCUTANEOUS

## 2014-03-25 MED ORDER — CLOPIDOGREL BISULFATE 75 MG PO TABS: 75.0000 mg | ORAL_TABLET | Freq: Every day | ORAL | Status: DC

## 2014-03-25 MED ORDER — NITROGLYCERIN 0.4 MG SL SUBL
0.4000 mg | SUBLINGUAL_TABLET | SUBLINGUAL | Status: DC | PRN
  Administered 2014-03-25 (×2): 0.4 mg via SUBLINGUAL
  Filled 2014-03-25 (×2): qty 1

## 2014-03-25 MED ORDER — SODIUM CHLORIDE 0.9 % IV SOLN: 250.0000 mL | INTRAVENOUS | Status: DC | PRN

## 2014-03-25 MED ORDER — HYDROMORPHONE HCL 1 MG/ML IJ SOLN: 0.5000 mg | INTRAMUSCULAR | Status: DC | PRN

## 2014-03-25 MED ORDER — ATROPINE SULFATE 0.1 MG/ML IJ SOLN
INTRAMUSCULAR | Status: AC
  Filled 2014-03-25: qty 10

## 2014-03-25 MED ORDER — METFORMIN HCL 500 MG PO TABS: 500.0000 mg | ORAL_TABLET | Freq: Every day | ORAL | Status: DC

## 2014-03-25 MED ORDER — INSULIN ASPART 100 UNIT/ML ~~LOC~~ SOLN
0.0000 [IU] | Freq: Every day | SUBCUTANEOUS | Status: DC
Start: 2014-03-25 — End: 2014-03-28

## 2014-03-25 MED ORDER — LISINOPRIL 20 MG PO TABS: 20.0000 mg | ORAL_TABLET | Freq: Every day | ORAL | Status: DC

## 2014-03-25 MED ORDER — SODIUM CHLORIDE 0.9 % IJ SOLN
3.0000 mL | INTRAMUSCULAR | Status: DC | PRN
Start: 2014-03-25 — End: 2014-03-28

## 2014-03-25 MED ORDER — CLOPIDOGREL BISULFATE 75 MG PO TABS
75.0000 mg | ORAL_TABLET | Freq: Every day | ORAL | Status: DC
  Administered 2014-03-26 – 2014-03-28 (×3): 75 mg via ORAL
  Filled 2014-03-25 (×3): qty 1

## 2014-03-25 MED ORDER — ATORVASTATIN CALCIUM 10 MG PO TABS: 10.0000 mg | ORAL_TABLET | Freq: Every day | ORAL | Status: DC

## 2014-03-25 MED ORDER — ALUM & MAG HYDROXIDE-SIMETH 200-200-20 MG/5ML PO SUSP
30.0000 mL | Freq: Four times a day (QID) | ORAL | Status: DC | PRN
Start: 2014-03-25 — End: 2014-03-28

## 2014-03-25 MED ORDER — CARVEDILOL 12.5 MG PO TABS: 12.5000 mg | ORAL_TABLET | Freq: Two times a day (BID) | ORAL | Status: DC

## 2014-03-25 MED ORDER — HYDRALAZINE HCL 20 MG/ML IJ SOLN
10.0000 mg | Freq: Once | INTRAMUSCULAR | Status: AC
  Administered 2014-03-25: 10 mg via INTRAVENOUS
  Filled 2014-03-25: qty 1

## 2014-03-25 MED ORDER — ACETAMINOPHEN 650 MG RE SUPP: 650.0000 mg | Freq: Four times a day (QID) | RECTAL | Status: DC | PRN

## 2014-03-25 MED ORDER — ONDANSETRON HCL 4 MG/2ML IJ SOLN
4.0000 mg | Freq: Four times a day (QID) | INTRAMUSCULAR | Status: DC | PRN
  Administered 2014-03-26 (×2): 4 mg via INTRAVENOUS
  Filled 2014-03-25 (×3): qty 2

## 2014-03-25 MED ORDER — ACETAMINOPHEN 325 MG PO TABS: 650.0000 mg | ORAL_TABLET | Freq: Four times a day (QID) | ORAL | Status: DC | PRN

## 2014-03-25 MED ORDER — NITROGLYCERIN IN D5W 200-5 MCG/ML-% IV SOLN
5.0000 ug/min | INTRAVENOUS | Status: DC
  Administered 2014-03-26: 5 ug/min via INTRAVENOUS
  Filled 2014-03-25: qty 250

## 2014-03-25 MED ORDER — CARVEDILOL 12.5 MG PO TABS
12.5000 mg | ORAL_TABLET | Freq: Two times a day (BID) | ORAL | Status: DC
  Administered 2014-03-26 – 2014-03-27 (×3): 12.5 mg via ORAL
  Filled 2014-03-25 (×5): qty 1

## 2014-03-25 MED ORDER — ATORVASTATIN CALCIUM 10 MG PO TABS
10.0000 mg | ORAL_TABLET | Freq: Every day | ORAL | Status: DC
  Administered 2014-03-26 – 2014-03-27 (×2): 10 mg via ORAL
  Filled 2014-03-25 (×2): qty 1

## 2014-03-25 NOTE — H&P (Signed)
[facility] Admission History and Physical       Darrell [name] XBD:[id number] DOB: 1956-01-15 DOA: 03/25/2014  Referring physician: \ PCP: No PCP Per Patient  Specialists:   Chief Complaint: Dizziness  HPI: Darrell [name] is a 59 y.o. male with a history of CAD S/P CABG x 5  11/2007, HTN, DM2, and Hyperlipidemia who presents to the ED with complaints of sudden onset of Dizziness that cause4d hin to fallback and hit his head on his TV.   He was evaluated and found to have a blood pressure of 246/136,  He reports being out of his medications for the past 3-4 months because he did not have transportation.   He wassent for a CT scan of the head which was negative for acute findings.  He was referred for medical admission.  He reported having chest pain while he was in the ED and a cardiac workup was also initiated.       Review of Systems:  Constitutional: No Weight Loss, No Weight Gain, Night Sweats, Fevers, Chills, +Dizziness, +Light Headedness, Fatigue, or Generalized Weakness HEENT: No Headaches, Difficulty Swallowing,Tooth/Dental Problems,Sore Throat,  No Sneezing, Rhinitis, Ear Ache, Nasal Congestion, or Post Nasal Drip,  Cardio-vascular:  +Chest pain, Orthopnea, PND, Edema in Lower Extremities, Anasarca, Dizziness, Palpitations  Resp: No Dyspnea, No DOE, No Productive Cough, No Non-Productive Cough, No Hemoptysis, No Wheezing.    GI: No Heartburn, Indigestion, Abdominal Pain, Nausea, Vomiting, Diarrhea, Constipation, Hematemesis, Hematochezia, Melena, Change in Bowel Habits,  Loss of Appetite  GU: No Dysuria, No Change in Color of Urine, No Urgency or Urinary Frequency, No Flank pain.  Musculoskeletal: No Joint Pain or Swelling, No Decreased Range of Motion, No Back Pain.  Neurologic: No Syncope, No Seizures, Muscle Weakness, Paresthesia, Vision Disturbance or Loss, No Diplopia, No Vertigo, No Difficulty Walking,  Skin: No Rash or Lesions. Psych: No Change in Mood or  Affect, No Depression or Anxiety, No Memory loss, No Confusion, or Hallucinations   Past Medical History  Diagnosis Date  . Coronary artery disease     a. 08/2007 s/p DES to RCA/RI;  b. 07/2006 s/p DES to p/d LCX;  c. 11/2007 CABGx5 (LIMA->LAD, VG->D1, LRA->OM1, VG->PDA->LPL;  d. 09/2012 STEMI/Cath: LM nl, LAD 100p, LCX 70p/90d, OM1 95, LPL 90d, RCA 100, LIMA->LAD nl (LAD 95d), RA->OM nl, VG->PDA->LPL 100, unable to locate VG->Diag, EF 60-65%->Med Rx.  Marland Kitchen Hypertension   . Diabetes mellitus without complication     a. A1c 7.0 09/2012.  Marland Kitchen Noncompliance   . Hyperlipidemia   . Claudication      Past Surgical History  Procedure Laterality Date  . Coronary artery bypass graft      2009 LIMA to LAD, SVG to Diag, SVG to PDA and PL, left radial to OM  . Left heart cath N/A 10/24/2012    Procedure: LEFT HEART CATH;  Surgeon: Troy Sine, MD;  Location: [facility] CATH LAB;  Service: Cardiovascular;  Laterality: N/A;      Prior to Admission medications   Medication Sig Start Date End Date Taking? Authorizing Provider  aspirin EC 81 MG EC tablet Take 1 tablet (81 mg total) by mouth daily. 10/28/12  Yes Darrell Mire, NP  atorvastatin (LIPITOR) 10 MG tablet Take 1 tablet (10 mg total) by mouth daily. Patient not taking: Reported on 03/25/2014 03/25/14   Veryl Speak, MD  carvedilol (COREG) 12.5 MG tablet Take 1 tablet (12.5 mg total) by mouth 2 (two) times daily with a meal.  Patient not taking: Reported on 03/25/2014 03/25/14   Veryl Speak, MD  clopidogrel (PLAVIX) 75 MG tablet Take 1 tablet (75 mg total) by mouth daily. Patient not taking: Reported on 03/25/2014 03/25/14   Veryl Speak, MD  glucose monitoring kit (FREESTYLE) monitoring kit 1 each by Does not apply route once. Glucometer with test strips and lancets to check blood glucose BID ac breakfast and dinner. 10/28/12   Darrell Mire, NP  lisinopril (PRINIVIL,ZESTRIL) 20 MG tablet Take 1 tablet (20 mg total) by mouth daily. Patient not  taking: Reported on 03/25/2014 03/25/14   Veryl Speak, MD  metFORMIN (GLUCOPHAGE) 500 MG tablet Take 1 tablet (500 mg total) by mouth daily. Patient not taking: Reported on 03/25/2014 03/25/14   Veryl Speak, MD  nitroGLYCERIN (NITROSTAT) 0.4 MG SL tablet Place 1 tablet (0.4 mg total) under the tongue every 5 (five) minutes as needed for chest pain. 10/28/12   Darrell Mire, NP     No Known Allergies  Social History:  reports that he quit smoking about 3 years ago. He does not have any smokeless tobacco history on file. He reports that he does not drink alcohol or use illicit drugs.    Family History  Problem Relation Age of Onset  . Heart failure Mother   . Cancer Mother   . Heart failure Father   . CAD Mother 89  . Kidney failure Brother        Physical Exam:  GEN:  Pleasant Elderly Appearing Thin  59 y.o. Caucasian male examined and in no acute distress; cooperative with exam Filed Vitals:   03/25/14 1814 03/25/14 1830 03/25/14 1900 03/25/14 1915  BP: 233/120 206/121 208/117 211/118  Pulse: 74 77 74 73  Temp: 98.5 F (36.9 C)     TempSrc: Oral     Resp: _0 SpO2: 99% 96% 97% 99%   Blood pressure 211/118, pulse 73, temperature 98.5 F (36.9 C), temperature source Oral, resp. rate 19, SpO2 99 %. PSYCH: He is alert and oriented x4; does not appear anxious does not appear depressed; affect is normal HEENT: Normocephalic and Atraumatic, Mucous membranes pink; PERRLA; EOM intact; Fundi:  Benign;  No scleral icterus, Nares: Patent, Oropharynx: Clear,     Neck:  FROM, No Cervical Lymphadenopathy nor Thyromegaly or Carotid Bruit; No JVD; Breasts:: Not examined CHEST WALL: No tenderness CHEST: Normal respiration, clear to auscultation bilaterally HEART: Regular rate and rhythm; no murmurs rubs or gallops BACK: No kyphosis or scoliosis; No CVA tenderness ABDOMEN: Positive Bowel Sounds, Scaphoid, Soft Non-Tender, No Rebound or Guarding; No Masses, No  Organomegaly. Rectal Exam: Not done EXTREMITIES: No Cyanosis, Clubbing, or Edema; No Ulcerations. Genitalia: not examined PULSES: 2+ and symmetric SKIN: Normal hydration no rash or ulceration CNS:  Alert and Oriented x 4, No Focal Deficits Vascular: pulses palpable throughout    Labs on Admission:  Basic Metabolic Panel:  Recent Labs Lab 03/25/14 0315 03/25/14 1907  NA 135 138  K 3.6 3.9  CL 100 101  CO2 26  --   GLUCOSE 145* 115*  BUN 13 10  CREATININE 1.20 1.20  CALCIUM 8.4  --    Liver Function Tests:  Recent Labs Lab 03/25/14 0315  AST 15  ALT 16  ALKPHOS 95  BILITOT 0.6  PROT 7.0  ALBUMIN 3.3*   No results for input(s): LIPASE, AMYLASE in the last 168 hours. No results for input(s): AMMONIA in the last 168 hours. CBC:  Recent Labs Lab  03/25/14 0315 03/25/14 1907  WBC 6.8  --   NEUTROABS 4.5  --   HGB 13.9 14.3  HCT 41.2 42.0  MCV 83.2  --   PLT 179  --    Cardiac Enzymes: No results for input(s): CKTOTAL, CKMB, CKMBINDEX, TROPONINI in the last 168 hours.  BNP (last 3 results) No results for input(s): BNP in the last 8760 hours.  ProBNP (last 3 results) No results for input(s): PROBNP in the last 8760 hours.  CBG: No results for input(s): GLUCAP in the last 168 hours.  Radiological Exams on Admission: Ct Head Wo Contrast  03/25/2014   CLINICAL DATA:  Sudden onset of dizziness and emesis 5 hours prior. Elevated blood pressure.  EXAM: CT HEAD WITHOUT CONTRAST  TECHNIQUE: Contiguous axial images were obtained from the base of the skull through the vertex without intravenous contrast.  COMPARISON:  05/21/2006  FINDINGS: No intracranial hemorrhage, mass effect, or midline shift. No hydrocephalus. The basilar cisterns are patent. No evidence of territorial infarct. No intracranial fluid collection. There is periventricular and deep white matter hypodensity, nonspecific, likely related to chronic small vessel ischemia. This is new from prior exam.  Calvarium is intact. Included paranasal sinuses and mastoid air cells are well aerated.  IMPRESSION: 1.  No acute intracranial abnormality. 2. Nonspecific periventricular white matter change, likely related to chronic small vessel ischemia, new/progressed from prior.   Electronically Signed   By: Jeb Levering M.D.   On: 03/25/2014 04:59     EKG: Independently reviewed. Normal Sinus Rhythm rate 96, Prolonged QT,  No Acute ST changes        Assessment/Plan:     59 y.o. male with   Principal Problem:   1.   Hypertensive urgency   NTG drip   PRN IV Hydralazine   Restart Carvedilol and Lisinopril Rx   Active Problems:   2.   Chest Pain- due to #1, begin Cardiac Rule out    Cardiac monitoring   NTG drip   Cycle Troponins   Restart Plavix, Aspirin, Carvedilol, Lisinopril and Atorvastatin rx     2.   Diabetes mellitus   SSI coverage PRN   Check HbA1C   Restart Metformin Rx     3.   HYPERCHOLESTEROLEMIA   Restart Atorvastatin Rx     4.   Coronary atherosclerosis   Restart Plavix, Aspirin, Carvedilol, Lisinopril and Atorvastatin rx     5.   DVT Prophylaxis    Lovenox     6. Other-  Case Management/ Social Work Consulted due to Estate agent problems    Preventing Patient from getting his Medcations and Seeing a PCP.       Code Status:     FULL CODE      Family Communication:   No Family Present    Disposition Plan:    Observation Status        Time spent:  [place] [facility] Pager [phone]   If [place] Please Contact the Day Rounding Team MD for [facility]  If 7PM-7AM, Please Contact Night-Floor Coverage  [email or web address] Password [facility] 03/25/2014, 7:55 PM     ADDENDUM:   Patient was seen and examined on 03/25/2014

## 2014-03-25 NOTE — ED Provider Notes (Addendum)
CSN: [id number]     Arrival date & time 03/25/14  1807 History   First MD Initiated Contact with Patient 03/25/14 1819     Chief Complaint  Patient presents with  . Hypertension     (Consider location/radiation/quality/duration/timing/severity/associated sxs/prior Treatment) Patient is a 59 y.o. male presenting with hypertension. The history is provided by the patient.  Hypertension This is a chronic problem. Associated symptoms include headaches. Pertinent negatives include no chest pain, no abdominal pain and no shortness of breath.   patient returns after being seen earlier in the day. At that time had headache and dizziness. Found to be hypertensive likely due to noncompliance. He's been off his medicines for a year. He was not able to get them filled. Continues to have high blood pressure. He states the headache and dizziness improved. States he cannot get his medicines filled either. Chest pain now. No numbness or weakness. Previous bypass and stenting. Also is diabetic.  Past Medical History  Diagnosis Date  . Coronary artery disease     a. 08/2007 s/p DES to RCA/RI;  b. 07/2006 s/p DES to p/d LCX;  c. 11/2007 CABGx5 (LIMA->LAD, VG->D1, LRA->OM1, VG->PDA->LPL;  d. 09/2012 STEMI/Cath: LM nl, LAD 100p, LCX 70p/90d, OM1 95, LPL 90d, RCA 100, LIMA->LAD nl (LAD 95d), RA->OM nl, VG->PDA->LPL 100, unable to locate VG->Diag, EF 60-65%->Med Rx.  Marland Kitchen Hypertension   . Diabetes mellitus without complication     a. A1c 7.0 09/2012.  Marland Kitchen Noncompliance   . Hyperlipidemia   . Claudication    Past Surgical History  Procedure Laterality Date  . Coronary artery bypass graft      2009 LIMA to LAD, SVG to Diag, SVG to PDA and PL, left radial to OM  . Left heart cath N/A 10/24/2012    Procedure: LEFT HEART CATH;  Surgeon: Troy Sine, MD;  Location: [facility] CATH LAB;  Service: Cardiovascular;  Laterality: N/A;   Family History  Problem Relation Age of Onset  . Heart failure Mother   . Cancer Mother   .  Heart failure Father   . CAD Mother 8  . Kidney failure Brother    History  Substance Use Topics  . Smoking status: Former Smoker    Quit date: 10/25/2010  . Smokeless tobacco: Not on file  . Alcohol Use: No    Review of Systems  Constitutional: Negative for activity change and appetite change.  Eyes: Negative for pain.  Respiratory: Negative for chest tightness and shortness of breath.   Cardiovascular: Negative for chest pain and leg swelling.  Gastrointestinal: Positive for nausea and vomiting. Negative for abdominal pain and diarrhea.  Genitourinary: Negative for flank pain.  Musculoskeletal: Negative for back pain and neck stiffness.  Skin: Negative for rash.  Neurological: Positive for dizziness and headaches. Negative for weakness and numbness.  Psychiatric/Behavioral: Negative for behavioral problems.      Allergies  Review of patient's allergies indicates no known allergies.  Home Medications   Prior to Admission medications   Medication Sig Start Date End Date Taking? Authorizing Provider  aspirin EC 81 MG EC tablet Take 1 tablet (81 mg total) by mouth daily. 10/28/12  Yes Rogelia Mire, NP  atorvastatin (LIPITOR) 10 MG tablet Take 1 tablet (10 mg total) by mouth daily. Patient not taking: Reported on 03/25/2014 03/25/14   Veryl Speak, MD  carvedilol (COREG) 12.5 MG tablet Take 1 tablet (12.5 mg total) by mouth 2 (two) times daily with a meal. Patient not taking: Reported  on 03/25/2014 03/25/14   Veryl Speak, MD  clopidogrel (PLAVIX) 75 MG tablet Take 1 tablet (75 mg total) by mouth daily. Patient not taking: Reported on 03/25/2014 03/25/14   Veryl Speak, MD  glucose monitoring kit (FREESTYLE) monitoring kit 1 each by Does not apply route once. Glucometer with test strips and lancets to check blood glucose BID ac breakfast and dinner. 10/28/12   Rogelia Mire, NP  lisinopril (PRINIVIL,ZESTRIL) 20 MG tablet Take 1 tablet (20 mg total) by mouth  daily. Patient not taking: Reported on 03/25/2014 03/25/14   Veryl Speak, MD  metFORMIN (GLUCOPHAGE) 500 MG tablet Take 1 tablet (500 mg total) by mouth daily. Patient not taking: Reported on 03/25/2014 03/25/14   Veryl Speak, MD  nitroGLYCERIN (NITROSTAT) 0.4 MG SL tablet Place 1 tablet (0.4 mg total) under the tongue every 5 (five) minutes as needed for chest pain. 10/28/12   Rogelia Mire, NP   BP 212/119 mmHg  Pulse 80  Temp(Src) 98.5 F (36.9 C) (Oral)  Resp 15  SpO2 96% Physical Exam  Constitutional: He is oriented to person, place, and time. He appears well-developed and well-nourished.  HENT:  Head: Normocephalic and atraumatic.  Eyes: EOM are normal. Pupils are equal, round, and reactive to light.  Neck: Normal range of motion. Neck supple.  Cardiovascular: Normal rate, regular rhythm and normal heart sounds.   No murmur heard. Pulmonary/Chest: Effort normal and breath sounds normal.  Abdominal: Soft. He exhibits no distension. There is no tenderness.  Musculoskeletal: Normal range of motion. He exhibits no edema.  Neurological: He is alert and oriented to person, place, and time. No cranial nerve deficit.  Skin: Skin is warm and dry.  Psychiatric: He has a normal mood and affect.  Nursing note and vitals reviewed.   ED Course  Procedures (including critical care time) Labs Review Labs Reviewed  I-STAT CHEM 8, ED - Abnormal; Notable for the following:    Glucose, Bld 115 (*)    All other components within normal limits  HEMOGLOBIN A1C  I-STAT TROPOININ, ED    Imaging Review Ct Head Wo Contrast  03/25/2014   CLINICAL DATA:  Sudden onset of dizziness and emesis 5 hours prior. Elevated blood pressure.  EXAM: CT HEAD WITHOUT CONTRAST  TECHNIQUE: Contiguous axial images were obtained from the base of the skull through the vertex without intravenous contrast.  COMPARISON:  05/21/2006  FINDINGS: No intracranial hemorrhage, mass effect, or midline shift. No  hydrocephalus. The basilar cisterns are patent. No evidence of territorial infarct. No intracranial fluid collection. There is periventricular and deep white matter hypodensity, nonspecific, likely related to chronic small vessel ischemia. This is new from prior exam. Calvarium is intact. Included paranasal sinuses and mastoid air cells are well aerated.  IMPRESSION: 1.  No acute intracranial abnormality. 2. Nonspecific periventricular white matter change, likely related to chronic small vessel ischemia, new/progressed from prior.   Electronically Signed   By: Jeb Levering M.D.   On: 03/25/2014 04:59     EKG Interpretation   Date/Time:  Friday March 25 2014 18:14:12 EST Ventricular Rate:  81 PR Interval:  194 QRS Duration: 92 QT Interval:  427 QTC Calculation: 496 R Axis:   69 Text Interpretation:  Sinus rhythm Probable anteroseptal infarct, old  Nonspecific ST and T wave abnormality Confirmed by Alvino Chapel  MD, [place]  [phone]) on 03/25/2014 9:37:16 PM      MDM   Final diagnoses:  Hypertensive urgency    Patient with hypertension. History  of same. Has been off his medications. Had improved with some treatment in the ER backup. He was unable to fill medicines and doesn't follow-up. Will admit to the hospitalist.    Jasper Riling. Alvino Chapel, MD 03/25/14 2138  Patient started complaining of chest pain. States he feels as if he is having a stroke. His request and nitroglycerin. States he does not know how it feels to have a stroke. He appears somewhat anxious.  Jasper Riling. Alvino Chapel, MD 03/25/14 2256

## 2014-03-25 NOTE — Progress Notes (Signed)
CARE MANAGEMENT NOTE 03/26/2014  Patient:  Darrell [name], Darrell [name]   Account Number:  [id number]  Date Initiated:  03/26/2014  Documentation initiated by:  Laurena Slimmer  Subjective/Objective Assessment:   Hypertensive Urgency     Action/Plan:   Referral for PCP  Medication co-pay  assistance   Anticipated DC Date:  03/27/2014   Anticipated DC Plan:  [place] [facility]  Medication Assistance      Choice offered to / List presented to:  C-1 Patient           Status of service:  Completed, signed off Medicare Important Message given?  NA - LOS <3 / Initial given by admissions (If response is "NO", the following Medicare IM given date fields will be blank) Date Medicare IM given:   Medicare IM given by:   Date Additional Medicare IM given:   Additional Medicare IM given by:    Discharge Disposition:  HOME/SELF CARE  Per UR Regulation:  Reviewed for med. necessity/level of care/duration of stay  If discussed at [place] of Stay Meetings, dates discussed:    Comments:  2/27//16 13:01 Laurena Slimmer RN BSN NCM [phone] Discharge Medications picked up from [facility] and given to [facility] on [address] to send to pharmacy until discharge. Patient made aware.   03/26/14 10:03 Laurena Slimmer RN BSN  [phone] ED CM explained CM would fax prescriptions to see [facility] pharmacy and pick up prescriptions today prior to closing for possible discharge tomorrow, patient agreeable with discharge plan.  Update Dr Mayra Reel on discharge plan and if any changes in medication  list he e-scribe to [facility] he is agreeable with discharge plan. No further CM needs identified.  03/25/14 22:03 [place] BSN  [phone]  ED CM received consult for medication assistance by Dr. Alvino Chapel EDP. Patient was evaluated yesterday in the ED with elevated B/P's. CM reviewed record patient has no PCP but has Medicare. Met with patient at bedside to discuss medication assistance and follow-up  care. Patient reports not having the money to fill prescriptions at this time and, not having a PCP. Discussed [place] and the services provided. Informed patient new patients receive first time prescriptions co-pay waived. Patient agreeable to establishing care at the [facility]. Scheduled follow-up appointment at the [facility] walk in clinic for Monday, February 29 at 12:15, written information given to patient.

## 2014-03-25 NOTE — ED Provider Notes (Signed)
CSN: [id number]     Arrival date & time 03/25/14  0241 History  This chart was scribed for Veryl Speak, MD by Rayfield Citizen, ED Scribe. This patient was seen in room D33C/D33C and the patient's care was started at 3:07 AM.    Chief Complaint  Patient presents with  . Dizziness  . Emesis   Patient is a 59 y.o. male presenting with dizziness and vomiting. The history is provided by the patient. No language interpreter was used.  Dizziness Associated symptoms: vomiting   Emesis    HPI Comments: RIYANSH GERSTNER is a 59 y.o. male with past medical history of CAD, HTN, HLD, DM who presents to the Emergency Department complaining of sudden onset dizziness ("things are shifting, tilting, affecting my balance") around midnight tonight. Patient explains that he walked out of the bathroom and began feeling dizzy; he states he fell backwards into the TV and hit his head. He reports an episode of vomiting after the fall; he has had multiple episodes of vomiting en route. His dizziness has resolved at present. He denies chest pain and SOB at this time, though he experienced both while vomiting. He denies any recent ear issues.   Patient is supposed to be taking medications for his HTN and HLD; he has not taken these in some time but is unsure how long. He had an MI in 2000; he had CABG in 2009 with a left heart cath in 2014.   Past Medical History  Diagnosis Date  . Coronary artery disease     a. 08/2007 s/p DES to RCA/RI;  b. 07/2006 s/p DES to p/d LCX;  c. 11/2007 CABGx5 (LIMA->LAD, VG->D1, LRA->OM1, VG->PDA->LPL;  d. 09/2012 STEMI/Cath: LM nl, LAD 100p, LCX 70p/90d, OM1 95, LPL 90d, RCA 100, LIMA->LAD nl (LAD 95d), RA->OM nl, VG->PDA->LPL 100, unable to locate VG->Diag, EF 60-65%->Med Rx.  Marland Kitchen Hypertension   . Diabetes mellitus without complication     a. A1c 7.0 09/2012.  Marland Kitchen Noncompliance   . Hyperlipidemia   . Claudication    Past Surgical History  Procedure Laterality Date  . Coronary artery bypass  graft      2009 LIMA to LAD, SVG to Diag, SVG to PDA and PL, left radial to OM  . Left heart cath N/A 10/24/2012    Procedure: LEFT HEART CATH;  Surgeon: Troy Sine, MD;  Location: [facility] CATH LAB;  Service: Cardiovascular;  Laterality: N/A;   Family History  Problem Relation Age of Onset  . Heart failure Mother   . Cancer Mother   . Heart failure Father   . CAD Mother 79  . Kidney failure Brother    History  Substance Use Topics  . Smoking status: Former Smoker    Quit date: 10/25/2010  . Smokeless tobacco: Not on file  . Alcohol Use: No    Review of Systems  A complete 10 system review of systems was obtained and all systems are negative except as noted in the HPI and PMH.   Allergies  Review of patient's allergies indicates no known allergies.  Home Medications   Prior to Admission medications   Medication Sig Start Date End Date Taking? Authorizing Provider  aspirin EC 81 MG EC tablet Take 1 tablet (81 mg total) by mouth daily. 10/28/12   Rogelia Mire, NP  atorvastatin (LIPITOR) 80 MG tablet Take 1 tablet (80 mg total) by mouth daily at 6 PM. 10/28/12   Rogelia Mire, NP  azithromycin [facility]) 250  MG tablet Take 1 tablet (250 mg total) by mouth daily. 10/28/12   Rogelia Mire, NP  carvedilol (COREG) 12.5 MG tablet Take 1 tablet (12.5 mg total) by mouth 2 (two) times daily with a meal. 10/28/12   Rogelia Mire, NP  clopidogrel (PLAVIX) 75 MG tablet Take 1 tablet (75 mg total) by mouth daily with breakfast. 10/28/12   Rogelia Mire, NP  glucose monitoring kit (FREESTYLE) monitoring kit 1 each by Does not apply route once. Glucometer with test strips and lancets to check blood glucose BID ac breakfast and dinner. 10/28/12   Rogelia Mire, NP  lisinopril (PRINIVIL,ZESTRIL) 20 MG tablet Take 1 tablet (20 mg total) by mouth daily. 10/28/12   Rogelia Mire, NP  metFORMIN (GLUCOPHAGE) 500 MG tablet Take 1 tablet (500 mg total) by mouth daily.  10/28/12   Rogelia Mire, NP  nitroGLYCERIN (NITROSTAT) 0.4 MG SL tablet Place 1 tablet (0.4 mg total) under the tongue every 5 (five) minutes as needed for chest pain. 10/28/12   Rogelia Mire, NP   BP 232/123 mmHg  Pulse 83  Temp(Src) 98.5 F (36.9 C) (Oral)  Resp 16  SpO2 98% Physical Exam  Constitutional: He is oriented to person, place, and time. He appears well-developed and well-nourished. No distress.  HENT:  Head: Normocephalic and atraumatic.  Mouth/Throat: Oropharynx is clear and moist. No oropharyngeal exudate.  Eyes: EOM are normal. Pupils are equal, round, and reactive to light.  Neck: Normal range of motion. Neck supple.  Cardiovascular: Normal rate, regular rhythm and normal heart sounds.  Exam reveals no gallop and no friction rub.   No murmur heard. Pulmonary/Chest: Effort normal. No respiratory distress. He has no wheezes. He has no rales.  Abdominal: Soft. There is no tenderness. There is no rebound and no guarding.  Musculoskeletal: Normal range of motion. He exhibits no edema.  Neurological: He is alert and oriented to person, place, and time.  Skin: Skin is warm and dry. No rash noted.  Psychiatric: He has a normal mood and affect. His behavior is normal.  Nursing note and vitals reviewed.   ED Course  Procedures   DIAGNOSTIC STUDIES: Oxygen Saturation is 99% on RA, normal by my interpretation.    COORDINATION OF CARE: 3:13 AM Discussed treatment plan with pt at bedside and pt agreed to plan.   Labs Review Labs Reviewed - No data to display  Imaging Review No results found.   Date: 03/25/2014  Rate: 83  Rhythm: normal sinus rhythm  QRS Axis: normal  Intervals: normal  ST/T Wave abnormalities: normal  Conduction Disutrbances:none  Narrative Interpretation:   Old EKG Reviewed: none available    MDM   Final diagnoses:  None    Patient is a 59 year old male with history of hypertension and coronary artery disease. He presents  here with complaints of dizziness that he describes as a spinning sensation. This caused him to fall and hit his head on a TV stand. He did have some vomiting afterward and presents for evaluation of this. He was given Zofran and no longer feels nauseated. He is markedly hypertensive upon presentation and was given labetalol with significant improvement. Since his blood pressure has improved, he is now feeling less dizzy and his headache has resolved. His workup reveals a negative head CT and no evidence for endorgan damage. At this point I feel as though he is appropriate for discharge. As he has not been able to refill his medications for  many months, I have agreed to prescribe a one-month supply to get him started until he can arrange a follow-up appointment.   I personally performed the services described in this documentation, which was scribed in my presence. The recorded information has been reviewed and is accurate.       Veryl Speak, MD 03/25/14 [phone]

## 2014-03-25 NOTE — ED Notes (Signed)
Patient transported to CT 

## 2014-03-25 NOTE — ED Notes (Signed)
MD at bedside. 

## 2014-03-25 NOTE — ED Notes (Signed)
Pt arrives from home via [facility]. Pt reports he came out of the bathroom and began feeling dizzy. Pt states he fell into the TV and hit his head, shortly after the fall he began vomiting. Pt had multiple episodes of vomiting en route.  Pt denies c/p and sob currently, but states he had both while he was vomiting. He received 4mg  of Zofran en route n/v has ceased. EMS reports pt had trouble standing and was off balance upon arrival. Pt has extensive cardiac hx and DM. Pt states he has been out of his meds  For months, but is unsure how long.

## 2014-03-25 NOTE — ED Notes (Signed)
CBG-125. Notified RN.

## 2014-03-25 NOTE — Discharge Instructions (Signed)
Resume your medications as previously prescribed. I have provided you with a one-month supply of each of your medications with which you have run out.  Please follow-up with your primary doctor for future prescriptions and follow-up.   Hypertension Hypertension, commonly called high blood pressure, is when the force of blood pumping through your arteries is too strong. Your arteries are the blood vessels that carry blood from your heart throughout your body. A blood pressure reading consists of a higher number over a lower number, such as 110/72. The higher number (systolic) is the pressure inside your arteries when your heart pumps. The lower number (diastolic) is the pressure inside your arteries when your heart relaxes. Ideally you want your blood pressure below 120/80. Hypertension forces your heart to work harder to pump blood. Your arteries may become narrow or stiff. Having hypertension puts you at risk for heart disease, stroke, and other problems.  RISK FACTORS Some risk factors for high blood pressure are controllable. Others are not.  Risk factors you cannot control include:   Race. You may be at higher risk if you are African American.  Age. Risk increases with age.  Gender. Men are at higher risk than women before age 58 years. After age 58, women are at higher risk than men. Risk factors you can control include:  Not getting enough exercise or physical activity.  Being overweight.  Getting too much fat, sugar, calories, or salt in your diet.  Drinking too much alcohol. SIGNS AND SYMPTOMS Hypertension does not usually cause signs or symptoms. Extremely high blood pressure (hypertensive crisis) may cause headache, anxiety, shortness of breath, and nosebleed. DIAGNOSIS  To check if you have hypertension, your health care provider will measure your blood pressure while you are seated, with your arm held at the level of your heart. It should be measured at least twice using the  same arm. Certain conditions can cause a difference in blood pressure between your right and left arms. A blood pressure reading that is higher than normal on one occasion does not mean that you need treatment. If one blood pressure reading is high, ask your health care provider about having it checked again. TREATMENT  Treating high blood pressure includes making lifestyle changes and possibly taking medicine. Living a healthy lifestyle can help lower high blood pressure. You may need to change some of your habits. Lifestyle changes may include:  Following the DASH diet. This diet is high in fruits, vegetables, and whole grains. It is low in salt, red meat, and added sugars.  Getting at least 2 hours of brisk physical activity every week.  Losing weight if necessary.  Not smoking.  Limiting alcoholic beverages.  Learning ways to reduce stress. If lifestyle changes are not enough to get your blood pressure under control, your health care provider may prescribe medicine. You may need to take more than one. Work closely with your health care provider to understand the risks and benefits. HOME CARE INSTRUCTIONS  Have your blood pressure rechecked as directed by your health care provider.   Take medicines only as directed by your health care provider. Follow the directions carefully. Blood pressure medicines must be taken as prescribed. The medicine does not work as well when you skip doses. Skipping doses also puts you at risk for problems.   Do not smoke.   Monitor your blood pressure at home as directed by your health care provider. SEEK MEDICAL CARE IF:   You think you are having a  reaction to medicines taken.  You have recurrent headaches or feel dizzy.  You have swelling in your ankles.  You have trouble with your vision. SEEK IMMEDIATE MEDICAL CARE IF:  You develop a severe headache or confusion.  You have unusual weakness, numbness, or feel faint.  You have severe  chest or abdominal pain.  You vomit repeatedly.  You have trouble breathing. MAKE SURE YOU:   Understand these instructions.  Will watch your condition.  Will get help right away if you are not doing well or get worse. Document Released: 01/14/2005 Document Revised: 05/31/2013 Document Reviewed: 11/06/2012 [facility] Patient Information 2015 [place], [place]. This information is not intended to replace advice given to you by your health care provider. Make sure you discuss any questions you have with your health care provider.

## 2014-03-25 NOTE — ED Notes (Signed)
Pt reports to the ED for eval of generalized weakness and dizziness. Pt was seen here last pm for medication refill but he was unable to afford his medication. Pt denies any CP or HA. No neuro deficits noted. Pt denies any pain at this time. Pt needed his statin, metformin, plavix, and his hypertension medications. Pt A&Ox4, resp e/u, and skin warm and dry.

## 2014-03-26 DIAGNOSIS — Z79899 Other long term (current) drug therapy: Secondary | ICD-10-CM | POA: Diagnosis not present

## 2014-03-26 DIAGNOSIS — Z9119 Patient's noncompliance with other medical treatment and regimen: Secondary | ICD-10-CM

## 2014-03-26 DIAGNOSIS — I248 Other forms of acute ischemic heart disease: Secondary | ICD-10-CM | POA: Diagnosis present

## 2014-03-26 DIAGNOSIS — Z8249 Family history of ischemic heart disease and other diseases of the circulatory system: Secondary | ICD-10-CM | POA: Diagnosis not present

## 2014-03-26 DIAGNOSIS — I252 Old myocardial infarction: Secondary | ICD-10-CM | POA: Diagnosis not present

## 2014-03-26 DIAGNOSIS — E785 Hyperlipidemia, unspecified: Secondary | ICD-10-CM | POA: Diagnosis present

## 2014-03-26 DIAGNOSIS — I674 Hypertensive encephalopathy: Principal | ICD-10-CM

## 2014-03-26 DIAGNOSIS — I10 Essential (primary) hypertension: Secondary | ICD-10-CM | POA: Diagnosis present

## 2014-03-26 DIAGNOSIS — Z951 Presence of aortocoronary bypass graft: Secondary | ICD-10-CM | POA: Diagnosis not present

## 2014-03-26 DIAGNOSIS — E1159 Type 2 diabetes mellitus with other circulatory complications: Secondary | ICD-10-CM

## 2014-03-26 DIAGNOSIS — I25118 Atherosclerotic heart disease of native coronary artery with other forms of angina pectoris: Secondary | ICD-10-CM | POA: Diagnosis not present

## 2014-03-26 DIAGNOSIS — E780 Pure hypercholesterolemia: Secondary | ICD-10-CM | POA: Diagnosis present

## 2014-03-26 DIAGNOSIS — Z7982 Long term (current) use of aspirin: Secondary | ICD-10-CM | POA: Diagnosis not present

## 2014-03-26 DIAGNOSIS — E119 Type 2 diabetes mellitus without complications: Secondary | ICD-10-CM | POA: Diagnosis present

## 2014-03-26 DIAGNOSIS — Z9114 Patient's other noncompliance with medication regimen: Secondary | ICD-10-CM | POA: Diagnosis present

## 2014-03-26 DIAGNOSIS — Z87891 Personal history of nicotine dependence: Secondary | ICD-10-CM | POA: Diagnosis not present

## 2014-03-26 DIAGNOSIS — W19XXXA Unspecified fall, initial encounter: Secondary | ICD-10-CM | POA: Diagnosis not present

## 2014-03-26 DIAGNOSIS — I2510 Atherosclerotic heart disease of native coronary artery without angina pectoris: Secondary | ICD-10-CM | POA: Diagnosis present

## 2014-03-26 DIAGNOSIS — R7989 Other specified abnormal findings of blood chemistry: Secondary | ICD-10-CM | POA: Diagnosis not present

## 2014-03-26 DIAGNOSIS — Z7902 Long term (current) use of antithrombotics/antiplatelets: Secondary | ICD-10-CM | POA: Diagnosis not present

## 2014-03-26 DIAGNOSIS — Z955 Presence of coronary angioplasty implant and graft: Secondary | ICD-10-CM | POA: Diagnosis not present

## 2014-03-26 DIAGNOSIS — R079 Chest pain, unspecified: Secondary | ICD-10-CM | POA: Diagnosis not present

## 2014-03-26 LAB — BASIC METABOLIC PANEL
ANION GAP: 8 (ref 5–15)
BUN: 11 mg/dL (ref 6–23)
CO2: 23 mmol/L (ref 19–32)
CREATININE: 1.19 mg/dL (ref 0.50–1.35)
Calcium: 8.6 mg/dL (ref 8.4–10.5)
Chloride: 105 mmol/L (ref 96–112)
GFR calc Af Amer: 77 mL/min — ABNORMAL LOW (ref >90)
GFR, EST NON AFRICAN AMERICAN: 66 mL/min — AB (ref >90)
GLUCOSE: 186 mg/dL — AB (ref 70–99)
Potassium: 3.9 mmol/L (ref 3.5–5.1)
SODIUM: 136 mmol/L (ref 135–145)

## 2014-03-26 LAB — GLUCOSE, CAPILLARY
GLUCOSE-CAPILLARY: 156 mg/dL — AB (ref 70–99)
GLUCOSE-CAPILLARY: 174 mg/dL — AB (ref 70–99)
Glucose-Capillary: 158 mg/dL — ABNORMAL HIGH (ref 70–99)
Glucose-Capillary: 164 mg/dL — ABNORMAL HIGH (ref 70–99)
Glucose-Capillary: 106 mg/dL — ABNORMAL HIGH (ref 70–99)

## 2014-03-26 LAB — CBC
HEMATOCRIT: 41.5 % (ref 39.0–52.0)
HEMOGLOBIN: 13.9 g/dL (ref 13.0–17.0)
MCH: 28.3 pg (ref 26.0–34.0)
MCHC: 33.5 g/dL (ref 30.0–36.0)
MCV: 84.3 fL (ref 78.0–100.0)
Platelets: 202 10*3/uL (ref 150–400)
RBC: 4.92 MIL/uL (ref 4.22–5.81)
RDW: 15.2 % (ref 11.5–15.5)
WBC: 8.4 10*3/uL (ref 4.0–10.5)

## 2014-03-26 LAB — TROPONIN I
TROPONIN I: 0.09 ng/mL — AB (ref <0.031)
TROPONIN I: 0.32 ng/mL — AB (ref <0.031)
TROPONIN I: 0.27 ng/mL — AB (ref <0.031)

## 2014-03-26 LAB — MRSA PCR SCREENING: MRSA by PCR: NEGATIVE

## 2014-03-26 MED ORDER — INSULIN GLARGINE 100 UNIT/ML ~~LOC~~ SOLN
5.0000 [IU] | Freq: Every day | SUBCUTANEOUS | Status: DC
  Administered 2014-03-26 – 2014-03-27 (×2): 5 [IU] via SUBCUTANEOUS
  Filled 2014-03-26 (×4): qty 0.05

## 2014-03-26 NOTE — Consult Note (Signed)
CARDIOLOGY CONSULT NOTE  Assessment and Plan:  *Elevated troponins:  Darrell [name] is a 59 year old male with history of CAD s/p CABG, diabetes, hypertension, medication noncompliance is currently admitted to the hospitalist service for management of hypertensive urgency. Patient was noted to have systolic blood pressures of 230s yesterday during admission. Overnight, after being restarted on carvedilol and lisinopril, his blood pressures are normalized. He denies any symptoms suggestive of unstable angina at this time. Reviewing his EKGs, there is no evidence of concerning ST T wave abnormalities suggesting ischemia. I suspect the elevated troponins were likely due to hypertensive urgency. For now, we recommend conservative management.  In summary, we recommend:  -- Continue aspirin 81 mg daily -- Recommend increasing atorvastatin to 80 mg daily based on new ACC AHA guidelines. -- Okay to continue Plavix if patient can afford it.  -- Continue carvedilol 12.5 mg by mouth twice a day -- Continue lisinopril 20 g daily -- Please obtain a transthoracic echocardiogram.  -- Counseled the patient extensively about taking importance of taking his medications.  -- Recommend outpatient follow-up with cardiology in 1 to 2 months after being discharged from the hospital.  Thank you very much for this very interesting consult. We will continue to follow along. Please do not hesitate to call the on-call cardiology pager with any questions   Chief complaint: chest pain, dizziness   HPI:  Darrell [name] is a pleasant 59 year old male with history of CAD s/p CABG (LIMA to LAD, RA to OM, SVG to PDA/LP 100, SVG to D 100), noncompliance, hypertension, diabetes mellitus is currently admitted to the hospitalist service with hypertensive urgency. He presented to the hospital yesterday with symptoms of dizziness, lightheadedness and extreme fatigue. And the emergency department, he was noted to have elevated  blood pressures to 230s over 130s. He was admitted to the hospitalist service overnight for management of hypertensive urgency. This afternoon, patient was noted to have elevated cardiac biomarkers with troponin elevation of 0.3. That's when cardiology consult was made. Upon my evaluation currently, patient denies any symptoms of chest pain, chest pressure, shortness of breath or diaphoresis yesterday. Patient has been noncompliant with his medications and was recently seen in the emergency department approximately 5 days ago with similar symptoms when he was discharged with medication refills. He also denies any symptoms suggestive of congestive heart failure including PND, orthopnea or lower extremity edema.  Past Medical History Past Medical History  Diagnosis Date  . Coronary artery disease     a. 08/2007 s/p DES to RCA/RI;  b. 07/2006 s/p DES to p/d LCX;  c. 11/2007 CABGx5 (LIMA->LAD, VG->D1, LRA->OM1, VG->PDA->LPL;  d. 09/2012 STEMI/Cath: LM nl, LAD 100p, LCX 70p/90d, OM1 95, LPL 90d, RCA 100, LIMA->LAD nl (LAD 95d), RA->OM nl, VG->PDA->LPL 100, unable to locate VG->Diag, EF 60-65%->Med Rx.  Marland Kitchen Hypertension   . Diabetes mellitus without complication     a. A1c 7.0 09/2012.  Marland Kitchen Noncompliance   . Hyperlipidemia   . Claudication    Allergies: No Known Allergies  Social History History   Social History  . Marital Status: Married    Spouse Name: N/A  . Number of Children: 3  . Years of Education: N/A   Occupational History  . Not on file.   Social History Main Topics  . Smoking status: Former Smoker    Quit date: 10/25/2010  . Smokeless tobacco: Not on file  . Alcohol Use: No  . Drug Use: No  . Sexual Activity:  Not on file   Other Topics Concern  . Not on file   Social History Narrative   Lives at home with wife.      Family History Family History  Problem Relation Age of Onset  . Heart failure Mother   . Cancer Mother   . Heart failure Father   . CAD Mother [id number]  . Kidney  failure Brother     Physical Exam Filed Vitals:   03/26/14 1700  BP: 139/74  Pulse: 77  Temp:   Resp:     Medications: He has not taken these medications in over 4 months.  Aspirin 81 mg daily Atorvastatin 10 mg daily Carvedilol 12.5 g by mouth twice a day Plavix 75 g daily Lisinopril 20 mg daily  Physical Exam Gen: Comfortable appearing, appears somewhat somnolent but easily arousable and appropriate. Unkempt. HEENT: Moist mucous membranes, poor dentition Neck: Supple, no JVD, no carotid bruits CV: Regular rate rhythm, normal S1, normal S2, +2 pulses in right upper extremity, no radial artery in the left arm, +1 right DP, 2+ left DP Pulm: Clear to auscultation bilaterally anteriorly, normal breathing Abdomen: Soft, nontender, nondistended, obese Ext: No cyanosis clubbing or edema  Labs:  [facility]-Stat Troponin, [facility]     Status: None   Collection Time: 03/25/14  7:06 PM  Result Value Ref Range   Troponin i, poc 0.02 0.00 - 0.08 ng/mL   Comment 3          CBC     Status: None   Collection Time: 03/26/14  2:51 AM  Result Value Ref Range   WBC 8.4 4.0 - 10.5 K/uL   RBC 4.92 4.22 - 5.81 MIL/uL   Hemoglobin 13.9 13.0 - 17.0 g/dL   HCT 13.041.5 86.539.0 - 78.452.0 %   MCV 84.3 78.0 - 100.0 fL   MCH 28.3 26.0 - 34.0 pg   MCHC 33.5 30.0 - 36.0 g/dL   RDW 69.615.2 29.511.5 - 28.415.5 %   Platelets 202 150 - 400 K/uL  Troponin I (q 6hr x 3)     Status: Abnormal   Collection Time: 03/26/14  4:05 AM  Result Value Ref Range   Troponin I 0.09 (H) <0.031 ng/mL  Result Value Ref Range  Troponin I (q 6hr x 3)     Status: Abnormal  Result Value Ref Range   Troponin I 0.32 (H) <0.031 ng/mL  Glucose, capillary     Status: Abnormal

## 2014-03-26 NOTE — Progress Notes (Signed)
UR completed 

## 2014-03-26 NOTE — Progress Notes (Addendum)
Pt BP 244/133 with heart rate in the lower 100's and complaining of chest discomfort. After giving 0.4mg  SL nitroglycerin, pt became diaphoretic and nasueated with a BP of 120/80 and HR in the 40's. Within a couple minutes, BP came back up to 222/110 with a heart rate in the lower 100's and patient was still nauseated. Claiborne Billingsallahan NP notified as I was concerned about starting nitro drip with such a sensitive response to SL nitroglycerine. Advised to start drip. Will remain in pt room and continue to monitor.

## 2014-03-26 NOTE — Progress Notes (Addendum)
[facility] PROGRESS NOTE  Darrell [name] Darrell [name] [id number] DOB: 1955-10-24 DOA: 03/25/2014 PCP: Lora PaulaFUNCHES, JOSALYN C, MD  Assessment/Plan: 1-accelerated HTN: patient off meds for more than 4 months or so -in stepdown with good response to use of nitro-drip -home meds has been resumed and plan is to slowly wean him off nitro-drip -continue low sodium diet -will need close follow up and further medication adjustments  2-hypertensive encephalopathy: with dizziness and slight confusion; now resolved -patient is now AAOX3 -continue BP control -no focal deficit -CT head neg -will continue ASA and plavix   3-chest pain: with prior hx of CAD -troponin elevated -no abnormalities on EKG -most likely in setting of accelerated HTN -on nitro drip, will check Echo -will also continue ASA, plavix, b-blocker, statin and ACE -cardiology consulted  4-HLD: continue statins  5-diabetes: A1C pending -continue SSI -will hold metformin in case any contrast procedure is needed -will add low dose lantus  6-elevated troponin: as mentioned above. HEART Score 6-7 -cardiology has been called; will follow rec'Darrell regarding heparin initiation (patient is chest pain free now) -will continue treatment with ASA, plavix, b-blocker, ACE inhibitor, statins   Code Status: full  Family Communication: no family at bedside  Disposition Plan: home when medically stable    Consultants:  None   Procedures:  See below for x-ray reports   Antibiotics:  None   HPI/Subjective: Afebrile. Feeling better. Denies CP or SOB. He is AAOX3  Objective: Filed Vitals:   03/26/14 1600  BP: 136/76  Pulse: 90  Temp: 98.3 F (36.8 C)  Resp: 16    Intake/Output Summary (Last 24 hours) at 03/26/14 1659 Last data filed at 03/26/14 1300  Gross per 24 hour  Intake 808.21 ml  Output   1300 ml  Net -491.79 ml   There were no vitals filed for this visit.  Exam:   General:  Denies CP or SOB, no fever.  AAOX3  Cardiovascular: S1 and S2, no rubs or gallops  Respiratory: good air movement, no wheezing  Abdomen: soft, NT, ND, positive BS  Musculoskeletal: trace edema bilaterally  Data Reviewed: Basic Metabolic Panel:  Recent Labs Lab 03/25/14 0315 03/25/14 1907 03/26/14 0251  NA 135 138 136  K 3.6 3.9 3.9  CL 100 101 105  CO2 26  --  23  GLUCOSE 145* 115* 186*  BUN 13 10 11   CREATININE 1.20 1.20 1.19  CALCIUM 8.4  --  8.6   Liver Function Tests:  Recent Labs Lab 03/25/14 0315  AST 15  ALT 16  ALKPHOS 95  BILITOT 0.6  PROT 7.0  ALBUMIN 3.3*   CBC:  Recent Labs Lab 03/25/14 0315 03/25/14 1907 03/26/14 0251  WBC 6.8  --  8.4  NEUTROABS 4.5  --   --   HGB 13.9 14.3 13.9  HCT 41.2 42.0 41.5  MCV 83.2  --  84.3  PLT 179  --  202   Cardiac Enzymes:  Recent Labs Lab 03/26/14 0405 03/26/14 1115  TROPONINI 0.09* 0.32*   CBG:  Recent Labs Lab 03/25/14 2204 03/26/14 0030 03/26/14 0718 03/26/14 1113 03/26/14 1605  GLUCAP 125* 174* 158* 156* 164*    Recent Results (from the past 240 hour(Darrell))  MRSA PCR Screening     Status: None   Collection Time: 03/25/14 11:31 PM  Result Value Ref Range Status   MRSA by PCR NEGATIVE NEGATIVE Final    Comment:        The GeneXpert MRSA Assay (FDA approved for NASAL  specimens only), is one component of a comprehensive MRSA colonization surveillance program. It is not intended to diagnose MRSA infection nor to guide or monitor treatment for MRSA infections.      Studies: Ct Head Wo Contrast  03/25/2014   CLINICAL DATA:  Sudden onset of dizziness and emesis 5 hours prior. Elevated blood pressure.  EXAM: CT HEAD WITHOUT CONTRAST  TECHNIQUE: Contiguous axial images were obtained from the base of the skull through the vertex without intravenous contrast.  COMPARISON:  05/21/2006  FINDINGS: No intracranial hemorrhage, mass effect, or midline shift. No hydrocephalus. The basilar cisterns are patent. No evidence of  territorial infarct. No intracranial fluid collection. There is periventricular and deep white matter hypodensity, nonspecific, likely related to chronic small vessel ischemia. This is new from prior exam. Calvarium is intact. Included paranasal sinuses and mastoid air cells are well aerated.  IMPRESSION: 1.  No acute intracranial abnormality. 2. Nonspecific periventricular white matter change, likely related to chronic small vessel ischemia, new/progressed from prior.   Electronically Signed   By: Rubye Oaks M.D.   On: 03/25/2014 04:59    Scheduled Meds: . aspirin EC  81 mg Oral Daily  . atorvastatin  10 mg Oral Daily  . carvedilol  12.5 mg Oral BID WC  . clopidogrel  75 mg Oral Daily  . insulin aspart  0-5 Units Subcutaneous QHS  . insulin aspart  0-9 Units Subcutaneous TID WC  . lisinopril  20 mg Oral Daily  . sodium chloride  3 mL Intravenous Q12H   Continuous Infusions: . nitroGLYCERIN 40 mcg/min (03/26/14 1300)    Principal Problem:   Hypertensive urgency Active Problems:   Diabetes mellitus   HYPERCHOLESTEROLEMIA   Coronary atherosclerosis   Noncompliance    Time spent: 35 minutes (> 50% of time dedicated to coordinate care, on face to face examination and discussion of his condition and needs of medication compliance)    Vassie Loll  [facility] Pager [phone] If 7PM-7AM, please contact night-coverage at [email or web address], password [facility] 03/26/2014, 4:59 PM  LOS: 0 days

## 2014-03-27 DIAGNOSIS — R079 Chest pain, unspecified: Secondary | ICD-10-CM

## 2014-03-27 DIAGNOSIS — I248 Other forms of acute ischemic heart disease: Secondary | ICD-10-CM

## 2014-03-27 DIAGNOSIS — R7989 Other specified abnormal findings of blood chemistry: Secondary | ICD-10-CM | POA: Insufficient documentation

## 2014-03-27 DIAGNOSIS — R778 Other specified abnormalities of plasma proteins: Secondary | ICD-10-CM | POA: Insufficient documentation

## 2014-03-27 LAB — GLUCOSE, CAPILLARY
GLUCOSE-CAPILLARY: 112 mg/dL — AB (ref 70–99)
GLUCOSE-CAPILLARY: 81 mg/dL (ref 70–99)
Glucose-Capillary: 141 mg/dL — ABNORMAL HIGH (ref 70–99)
Glucose-Capillary: 127 mg/dL — ABNORMAL HIGH (ref 70–99)

## 2014-03-27 MED ORDER — CARVEDILOL 25 MG PO TABS
25.0000 mg | ORAL_TABLET | Freq: Two times a day (BID) | ORAL | Status: DC
  Administered 2014-03-27 – 2014-03-28 (×3): 25 mg via ORAL
  Filled 2014-03-27 (×4): qty 1

## 2014-03-27 MED ORDER — ISOSORB DINITRATE-HYDRALAZINE 20-37.5 MG PO TABS
1.0000 | ORAL_TABLET | Freq: Two times a day (BID) | ORAL | Status: DC
  Administered 2014-03-27 – 2014-03-28 (×3): 1 via ORAL
  Filled 2014-03-27 (×4): qty 1

## 2014-03-27 MED ORDER — ATORVASTATIN CALCIUM 80 MG PO TABS
80.0000 mg | ORAL_TABLET | Freq: Every day | ORAL | Status: DC
  Administered 2014-03-28: 80 mg via ORAL
  Filled 2014-03-27: qty 1

## 2014-03-27 NOTE — Progress Notes (Signed)
Progress Note  Subjective:    No complaints this AM. Denies chest pain, SOB, dizziness, lightheadedness, or diaphoresis.   Objective:   Temp:  [98.2 F (36.8 C)-98.9 F (37.2 C)] 98.3 F (36.8 C) (02/28 0734) Pulse Rate:  [70-92] 84 (02/28 0734) Resp:  [11-18] 18 (02/28 0734) BP: (96-180)/(44-102) 180/99 mmHg (02/28 0737) SpO2:  [93 %-97 %] 97 % (02/28 0734)    There were no vitals filed for this visit.  Intake/Output Summary (Last 24 hours) at 03/27/14 0840 Last data filed at 03/26/14 2100  Gross per 24 hour  Intake  845.5 ml  Output    725 ml  Net  120.5 ml    Telemetry: NSR  Physical Exam: General: White male, alert, cooperative, NAD. HEENT: PERRL, EOMI. Moist mucus membranes Neck: Full range of motion without pain, supple, no lymphadenopathy or carotid bruits Lungs: Clear to ascultation bilaterally, normal work of respiration, no wheezes, rales, rhonchi Heart: RRR, no murmurs, gallops, or rubs Abdomen: Soft, non-tender, non-distended, BS + Extremities: No cyanosis, clubbing, or edema Neurologic: Alert & oriented x3, cranial nerves II-XII intact, strength grossly intact, sensation intact to light touch   Lab Results:  Basic Metabolic Panel:  Recent Labs Lab 03/25/14 0315 03/25/14 1907 03/26/14 0251  NA 135 138 136  K 3.6 3.9 3.9  CL 100 101 105  CO2 26  --  23  GLUCOSE 145* 115* 186*  BUN CREATININE 1.20 1.20 1.19  CALCIUM 8.4  --  8.6    Liver Function Tests:  Recent Labs Lab 03/25/14 0315  AST 15  ALT 16  ALKPHOS 95  BILITOT 0.6  PROT 7.0  ALBUMIN 3.3*    CBC:  Recent Labs Lab 03/25/14 0315 03/25/14 1907 03/26/14 0251  WBC 6.8  --  8.4  HGB 13.9 14.3 13.9  HCT 41.2 42.0 41.5  MCV 83.2  --  84.3  PLT 179  --  202    Cardiac Enzymes:  Recent Labs Lab 03/26/14 0405 03/26/14 1115 03/26/14 1616  TROPONINI 0.09* 0.32* 0.27*    ECG: NSR, Q-wave in lead III, prolonged QTc (both chronic)   Medications:     Scheduled Medications: . aspirin EC  81 mg Oral Daily  . atorvastatin  10 mg Oral Daily  . carvedilol  12.5 mg Oral BID WC  . clopidogrel  75 mg Oral Daily  . insulin aspart  0-5 Units Subcutaneous QHS  . insulin aspart  0-9 Units Subcutaneous TID WC  . insulin glargine  5 Units Subcutaneous QHS  . isosorbide-hydrALAZINE  1 tablet Oral BID  . lisinopril  20 mg Oral Daily  . sodium chloride  3 mL Intravenous Q12H     Infusions: . nitroGLYCERIN Stopped (03/26/14 1600)     PRN Medications:  sodium chloride, acetaminophen **OR** acetaminophen, alum & mag hydroxide-simeth, hydrALAZINE, HYDROmorphone (DILAUDID) injection, nitroGLYCERIN, ondansetron **OR** ondansetron (ZOFRAN) IV, oxyCODONE, sodium chloride   Assessment and Plan:  59 y/o M w/ PMhx of HTN, DM type II, CAD s/p CABG, admitted to hospitalist service on 03/25/14 for hypertensive urgency. Cardiology consulted for troponin elevation.   CAD s/p CABG w/ Elevated Troponin: Troponin peaked yesterday at 0.32. After reviewing EKG's, no apparent ischemic changes. LCH from 09/2012 showed normal LV function with an ejection fraction of 60-65% without segmental wall motion abnormalities. Also showed significant native coronary disease with total occlusion of the proximal LAD, diffuse disease in the LCx with 70% proximal stenosis and 90% distal stenosis.  RCA had total proximal occlusion w/ birdging collaterals and an occluded vein graft which reportedly had supplied the PDA and PLA vessels. There was a patent radial graft which supplies the LCx territory and gives rise to 3 branches and collateralizes distal RCA and possible diagonal vessels. Lastly, showed a patent LIMA to LAD with filling retrograde back up to the point of proximal occlusion. The distal LAD is apparently quite diminutive and has diffuse narrowing with collateralization to the distal RCA. The decision was made to continue only medical therapy at this time. Patient denies any  chest pain, SOB, palpitations, dizziness, lightheadedness, nausea, or diaphoresis currently, however, he does admit to some chest pain at home when his BP was elevated. Suspect troponin leak most likely related to severe BP elevation, as high as 252/136 overnight on 03/25/14.  -Continue ASA + Plavix -Increase Lipitor to 80 mg qhs (only on 10 mg) -Increase Coreg to 25 mg bid -Continue Lisinopril 20 mg daily -Continue Bidil 20-37.5 mg bid -Hydralazine prn -ECHO planned for today -Plan for outpatient cardiology follow up   Hypertensive Urgency: Management per primary. Most likely related to non-compliance.  -Lisinopril, Bidil as above -Increase Coreg -Hydralazine IV prn   Lauris ChromanWoody Jones, MD PGY-2 Internal Medicine Pager: [phone]  Patient seen and examined with Dr. Yetta BarreJones. We discussed all aspects of the encounter. I agree with the assessment and plan as stated above.   Patient with hypertensive urgency after stopping meds for 3 months. Now much better after restarting meds. Agree with increasing carvedilol. Goal SBP for now 130-140. Minimal elevation in troponin likely due to hypertensive strain no need for cath.   Can go to tele from our standpoint with likely d/c in am.  We will sign off. Please call with questions. Will need case management consult to make sure he can afford meds prior to d/c.   Daniel Bensimhon,MD 11:18 AM

## 2014-03-27 NOTE — Progress Notes (Signed)
[facility] PROGRESS NOTE  Darrell [name] WJX:[id number] DOB: September 17, 1955 DOA: 03/25/2014 PCP: Lora Paula, MD  Assessment/Plan: 1-accelerated HTN: patient off meds for more than 4 months or so -off nitro-drip; BP up to 180 early this morning -will add bidil for better control of his BP -home meds has been resumed and discussion regarding compliance sustained with patient -continue low sodium diet -will need close follow up and further medication adjustments as an outpatient -will transfer to tele and if remains stable home in am  2-hypertensive encephalopathy: with dizziness and slight confusion; now resolved -patient is now AAOX3 -continue BP control -no focal deficit -CT head neg -will continue ASA and plavix   3-chest pain: with prior hx of CAD -troponin elevated; in setting of accelerated HTN -no abnormalities on EKG -off nitro dripp and currently CP free and w/o SOB -will follow 2-D Echo -will also continue ASA, plavix, b-blocker, statin and ACE -cardiology rec's appreciated -will add bidil to his regimen  4-HLD: continue statins  5-diabetes: A1C pending -continue SSI -will hold metformin while in hospital in case any contrast procedure is needed -will continue low dose lantus  6-elevated troponin: as mentioned above. HEART Score 6-7 -cardiology has seen patient; appreciated assistance and recommendations -follow 2-D echo -continue adjusted medications for BP -need for compliance discussed with patient -will continue treatment with ASA, plavix, b-blocker, ACE inhibitor, statins -will add bidil to his regimen   Code Status: full  Family Communication: no family at bedside  Disposition Plan: home when medically stable    Consultants:  Cardiology   Procedures:  See below for x-ray reports   2-D Echo pending: 2/28  Antibiotics:  None   HPI/Subjective: Afebrile. Feeling better. Denies CP, fever or SOB. He is AAOX3. No dizziness and no  lightheadedness. BP much better  Objective: Filed Vitals:   03/27/14 1238  BP: 110/61  Pulse: 78  Temp: 97.4 F (36.3 C)  Resp: 18    Intake/Output Summary (Last 24 hours) at 03/27/14 1414 Last data filed at 03/27/14 1241  Gross per 24 hour  Intake    709 ml  Output   1050 ml  Net   -341 ml   There were no vitals filed for this visit.  Exam:   General:  Denies CP or SOB, no fever. AAOX3. Patient denies lightheadedness and dizziness  Cardiovascular: S1 and S2, no rubs or gallops  Respiratory: good air movement, no wheezing  Abdomen: soft, NT, ND, positive BS  Musculoskeletal: trace edema bilaterally  Data Reviewed: Basic Metabolic Panel:  Recent Labs Lab 03/25/14 0315 03/25/14 1907 03/26/14 0251  NA 135 138 136  K 3.6 3.9 3.9  CL 100 101 105  CO2 26  --  23  GLUCOSE 145* 115* 186*  BUN CREATININE 1.20 1.20 1.19  CALCIUM 8.4  --  8.6   Liver Function Tests:  Recent Labs Lab 03/25/14 0315  AST 15  ALT 16  ALKPHOS 95  BILITOT 0.6  PROT 7.0  ALBUMIN 3.3*   CBC:  Recent Labs Lab 03/25/14 0315 03/25/14 1907 03/26/14 0251  WBC 6.8  --  8.4  NEUTROABS 4.5  --   --   HGB 13.9 14.3 13.9  HCT 41.2 42.0 41.5  MCV 83.2  --  84.3  PLT 179  --  202   Cardiac Enzymes:  Recent Labs Lab 03/26/14 0405 03/26/14 1115 03/26/14 1616  TROPONINI 0.09* 0.32* 0.27*   CBG:  Recent Labs Lab 03/26/14  1113 03/26/14 1605 03/26/14 2138 03/27/14 0734 03/27/14 1238  GLUCAP 156* 164* 106* 112* 141*    Recent Results (from the past 240 hour(s))  MRSA PCR Screening     Status: None   Collection Time: 03/25/14 11:31 PM  Result Value Ref Range Status   MRSA by PCR NEGATIVE NEGATIVE Final    Comment:        The GeneXpert MRSA Assay (FDA approved for NASAL specimens only), is one component of a comprehensive MRSA colonization surveillance program. It is not intended to diagnose MRSA infection nor to guide or monitor treatment for MRSA  infections.      Studies: No results found.  Scheduled Meds: . aspirin EC  81 mg Oral Daily  . [START ON 03/28/2014] atorvastatin  80 mg Oral Daily  . carvedilol  25 mg Oral BID WC  . clopidogrel  75 mg Oral Daily  . insulin aspart  0-5 Units Subcutaneous QHS  . insulin aspart  0-9 Units Subcutaneous TID WC  . insulin glargine  5 Units Subcutaneous QHS  . isosorbide-hydrALAZINE  1 tablet Oral BID  . lisinopril  20 mg Oral Daily  . sodium chloride  3 mL Intravenous Q12H   Continuous Infusions: . nitroGLYCERIN Stopped (03/26/14 1600)    Principal Problem:   Hypertensive urgency Active Problems:   Diabetes mellitus   HYPERCHOLESTEROLEMIA   Coronary atherosclerosis   Noncompliance    Time spent: 35 minutes (> 50% of time dedicated to coordinate care, on face to face examination and discussion of his condition and needs of medication compliance)    Vassie LollMadera, Katelynne Revak  [facility] Pager [phone] If 7PM-7AM, please contact night-coverage at [email or web address], password [email or web address] 03/27/2014, 2:14 PM  LOS: 1 day

## 2014-03-27 NOTE — Progress Notes (Signed)
Report to [facility], [place] RN, preparing pt for transfer to floor; questions encouraged and answered

## 2014-03-27 NOTE — Progress Notes (Signed)
  Echocardiogram 2D Echocardiogram has been performed.  Darrell [name], Darrell [name] 03/27/2014, 5:23 PM

## 2014-03-28 DIAGNOSIS — I674 Hypertensive encephalopathy: Secondary | ICD-10-CM | POA: Diagnosis not present

## 2014-03-28 LAB — BASIC METABOLIC PANEL
ANION GAP: 6 (ref 5–15)
BUN: 23 mg/dL (ref 6–23)
CO2: 26 mmol/L (ref 19–32)
Calcium: 7.9 mg/dL — ABNORMAL LOW (ref 8.4–10.5)
Chloride: 103 mmol/L (ref 96–112)
Creatinine, Ser: 1.28 mg/dL (ref 0.50–1.35)
GFR, EST AFRICAN AMERICAN: 70 mL/min — AB (ref >90)
GFR, EST NON AFRICAN AMERICAN: 61 mL/min — AB (ref >90)
Glucose, Bld: 134 mg/dL — ABNORMAL HIGH (ref 70–99)
Potassium: 3.6 mmol/L (ref 3.5–5.1)
Sodium: 135 mmol/L (ref 135–145)

## 2014-03-28 LAB — GLUCOSE, CAPILLARY
GLUCOSE-CAPILLARY: 119 mg/dL — AB (ref 70–99)
Glucose-Capillary: 105 mg/dL — ABNORMAL HIGH (ref 70–99)
Glucose-Capillary: 136 mg/dL — ABNORMAL HIGH (ref 70–99)
Glucose-Capillary: 166 mg/dL — ABNORMAL HIGH (ref 70–99)

## 2014-03-28 LAB — HEMOGLOBIN A1C
Hgb A1c MFr Bld: 6.2 % — ABNORMAL HIGH (ref 4.8–5.6)
Mean Plasma Glucose: 131 mg/dL

## 2014-03-28 MED ORDER — ATORVASTATIN CALCIUM 40 MG PO TABS: 40.0000 mg | ORAL_TABLET | Freq: Every day | ORAL | Status: DC

## 2014-03-28 MED ORDER — CARVEDILOL 25 MG PO TABS: 25.0000 mg | ORAL_TABLET | Freq: Two times a day (BID) | ORAL | Status: DC

## 2014-03-28 MED ORDER — INFLUENZA VAC SPLIT QUAD 0.5 ML IM SUSY
0.5000 mL | PREFILLED_SYRINGE | INTRAMUSCULAR | Status: AC
  Administered 2014-03-28: 0.5 mL via INTRAMUSCULAR
  Filled 2014-03-28: qty 0.5

## 2014-03-28 MED ORDER — ISOSORB DINITRATE-HYDRALAZINE 20-37.5 MG PO TABS: 1.0000 | ORAL_TABLET | Freq: Two times a day (BID) | ORAL | Status: DC

## 2014-03-28 MED ORDER — PNEUMOCOCCAL VAC POLYVALENT 25 MCG/0.5ML IJ INJ
0.5000 mL | INJECTION | INTRAMUSCULAR | Status: AC
  Administered 2014-03-28: 0.5 mL via INTRAMUSCULAR
  Filled 2014-03-28: qty 0.5

## 2014-03-28 MED ORDER — LISINOPRIL 20 MG PO TABS: 20.0000 mg | ORAL_TABLET | Freq: Every day | ORAL | Status: DC

## 2014-03-28 MED ORDER — METFORMIN HCL 500 MG PO TABS: 500.0000 mg | ORAL_TABLET | Freq: Two times a day (BID) | ORAL | Status: DC

## 2014-03-28 MED ORDER — CLOPIDOGREL BISULFATE 75 MG PO TABS: 75.0000 mg | ORAL_TABLET | Freq: Every day | ORAL | Status: DC

## 2014-03-28 NOTE — Progress Notes (Signed)
CSW (Clinical Child psychotherapistocial Worker) notified pt needing assistance home. Pt not appropriate to dc via bus because of house location. CSW confirmed address with pt and that pt will have access to home. Pt stated his wife will be home. CSW provided taxi voucher to Licensed conveyancerunit secretary as pt nurse unavailable. Pt has no further hospital social work needs.   Kerra Guilfoil, LCSWA [phone]

## 2014-03-28 NOTE — Discharge Summary (Signed)
Physician Discharge Summary  Darrell [name] CVE:[id number] DOB: October 22, 1955 DOA: 03/25/2014  PCP: Minerva Ends, MD  Admit date: 03/25/2014 Discharge date: 03/28/2014  Time spent: >30 minutes  Recommendations for Outpatient Follow-up:  1. Check basic metabolic panel to assess electrolytes and renal function 2. Reassess blood pressure and adjust medications as needed 3. Patient will require outpatient follow-up with cardiology in one to two-month in order to have Myoview/a stress test at that time  Discharge Diagnoses:  Principal Problem:   Hypertensive urgency Active Problems:   Diabetes mellitus   HYPERCHOLESTEROLEMIA   Coronary atherosclerosis   Noncompliance   Elevated troponin   Demand ischemia of myocardium   Discharge Condition: Stable and improved. Patient has been discharged home with instructions to follow with the wellness Center as instructed and with prescription/medications on hand to be compliant with regimen.  Diet recommendation: Low carbohydrates and low sodium diet  Filed Weights   03/27/14 1820 03/28/14 0459  Weight: 104.5 kg (230 lb 6.1 oz) 104.645 kg (230 lb 11.2 oz)    History of present illness:  59 y.o. male with a history of CAD S/P CABG x 5 11/2007, HTN, DM2, and Hyperlipidemia who presents to the ED with complaints of sudden onset of Dizziness, chest pain and a slight confusion. He was evaluated and found to have a blood pressure of 246/136, He reports being out of his medications for the past 4 months or so because he did not have transportation. He wassent for a CT scan of the head which was negative for acute findings. He was referred for medical admission.  Hospital Course:  1-accelerated HTN: patient off meds for more than 4 months or so -Require use of nitroglycerin drip to establish blood pressure initially. -Patient is off nitro-drip; with good BP control using by mouth medications at the moment of discharge  -Patient regimen  will be BiDil 1 tablet twice a day, carvedilol 25 mg twice a day and lisinopril 20 mg once a day, -discussion regarding medication compliance and the importance to follow a low-sodium diet was sustained with patient -Patient will be discharged home with follow-up at the wellness Center where he is planning to establish care and continue further medication adjustment as needed to control his conditions.  2-hypertensive encephalopathy: with dizziness and slight confusion; now resolved -patient is now AAOX3 -continue BP control -no focal deficit appreciated -CT head neg -will continue ASA and plavix   3-chest pain: with prior hx of CAD -troponin elevated (See below); in setting of accelerated HTN -no abnormalities seen on EKG or telemetry -currently CP free and w/o SOB since blood pressure has been controlled -will continue ASA, plavix, b-blocker, statin, bidil and ACE  4-HLD: continue statins  5-diabetes: A1C 6.2 -Will discharge on metformin 500 mg twice a day -Patient advised to follow a low carbohydrates diet.  6-elevated troponin: as mentioned above. HEART Score 6 -cardiology has seen patient; appreciated assistance and recommendations -2-D echo: Moderate LVH, grade 1 diastolic dysfunction; ejection fraction 60%. Technically limited study due to poor sound wave transmission, unable to assess for any wall motion abnormalities. -Discussed in detail need for medication compliance with patient -will continue medical management with ASA, plavix, b-blocker, ACE inhibitor, statins and bidil  -Plan is for cardiology follow-up in one to two-month as an outpatient (most likely at that time Myoview stress test).  7-deconditioning and claudication: A rolling walker has been recommended an order for the DME has been given.  Procedures:  See below for x-ray  reports   2-D Echo: 2/28 - Left ventricle: The cavity size was normal. Wall thickness was increased in a pattern of moderate LVH. The  estimated ejection fraction was 60%. Doppler parameters are consistent with abnormal left ventricular relaxation (grade 1 diastolic dysfunction). - Left atrium: The atrium was moderately dilated. - Impressions: Technically limited study due to poor sound wave transmission.  Consultations:  Cardiology  Discharge Exam: Filed Vitals:   03/28/14 0830  BP: 136/84  Pulse: 79  Temp:   Resp:     General: Denies CP, dizziness or SOB; no fever. AAOX3.  Cardiovascular: S1 and S2, no rubs or gallops  Respiratory: good air movement, no wheezing  Abdomen: soft, NT, ND, positive BS  Musculoskeletal: trace edema bilaterally; no cyanosis or clubbing  Discharge Instructions   Discharge Instructions    Diet - low sodium heart healthy    Complete by:  As directed      Discharge instructions    Complete by:  As directed   Close follow up and establish care with PCP Take medications as prescribed Please follow low sodium diet (less than 2 gram daily) Follow a low carbohydrates diet Maintain  Adequate hydration Check your weight on daily basis (please contact PCP office with any increase weight of more 3 pounds overnight and/or more than 5 pounds in a week)          Current Discharge Medication List    START taking these medications   Details  isosorbide-hydrALAZINE (BIDIL) 20-37.5 MG per tablet Take 1 tablet by mouth 2 (two) times daily. Qty: 60 tablet, Refills: 1      CONTINUE these medications which have CHANGED   Details  atorvastatin (LIPITOR) 40 MG tablet Take 1 tablet (40 mg total) by mouth daily. Qty: 30 tablet, Refills: 1    carvedilol (COREG) 25 MG tablet Take 1 tablet (25 mg total) by mouth 2 (two) times daily with a meal. Qty: 60 tablet, Refills: 1    clopidogrel (PLAVIX) 75 MG tablet Take 1 tablet (75 mg total) by mouth daily. Qty: 30 tablet, Refills: 0    lisinopril (PRINIVIL,ZESTRIL) 20 MG tablet Take 1 tablet (20 mg total) by mouth daily. Qty: 30  tablet, Refills: 1    metFORMIN (GLUCOPHAGE) 500 MG tablet Take 1 tablet (500 mg total) by mouth 2 (two) times daily with a meal. Qty: 60 tablet, Refills: 1      CONTINUE these medications which have NOT CHANGED   Details  aspirin EC 81 MG EC tablet Take 1 tablet (81 mg total) by mouth daily.    glucose monitoring kit (FREESTYLE) monitoring kit 1 each by Does not apply route once. Glucometer with test strips and lancets to check blood glucose BID ac breakfast and dinner. Qty: 1 each, Refills: 0    nitroGLYCERIN (NITROSTAT) 0.4 MG SL tablet Place 1 tablet (0.4 mg total) under the tongue every 5 (five) minutes as needed for chest pain. Qty: 25 tablet, Refills: 3       No Known Allergies Follow-up Information    Follow up with [facility].   Specialty:  Internal Medicine   Why:   [facility] and [facility], arrive Mon -Fri at [facility]) to see physician for hospital follow up   Contact information:   [address] [id number] [place] [phone]      The results of significant diagnostics from this hospitalization (including imaging, microbiology, ancillary and laboratory) are  listed below for reference.    Significant Diagnostic Studies: Ct Head Wo Contrast  03/25/2014   CLINICAL DATA:  Sudden onset of dizziness and emesis 5 hours prior. Elevated blood pressure.  EXAM: CT HEAD WITHOUT CONTRAST  TECHNIQUE: Contiguous axial images were obtained from the base of the skull through the vertex without intravenous contrast.  COMPARISON:  05/21/2006  FINDINGS: No intracranial hemorrhage, mass effect, or midline shift. No hydrocephalus. The basilar cisterns are patent. No evidence of territorial infarct. No intracranial fluid collection. There is periventricular and deep white matter hypodensity, nonspecific, likely related to chronic small vessel ischemia. This is new from prior exam. Calvarium is  intact. Included paranasal sinuses and mastoid air cells are well aerated.  IMPRESSION: 1.  No acute intracranial abnormality. 2. Nonspecific periventricular white matter change, likely related to chronic small vessel ischemia, new/progressed from prior.   Electronically Signed   By: Jeb Levering M.D.   On: 03/25/2014 04:59    Microbiology: Recent Results (from the past 240 hour(s))  MRSA PCR Screening     Status: None   Collection Time: 03/25/14 11:31 PM  Result Value Ref Range Status   MRSA by PCR NEGATIVE NEGATIVE Final    Comment:        The GeneXpert MRSA Assay (FDA approved for NASAL specimens only), is one component of a comprehensive MRSA colonization surveillance program. It is not intended to diagnose MRSA infection nor to guide or monitor treatment for MRSA infections.      Labs: Basic Metabolic Panel:  Recent Labs Lab 03/25/14 0315 03/25/14 1907 03/26/14 0251 03/28/14 0342  NA 135 138 136 135  K 3.6 3.9 3.9 3.6  CL 100 101 105 103  CO2 26  --  23 26  GLUCOSE 145* 115* 186* 134*  BUN $Re'13 10 11 23  'sft$ CREATININE 1.20 1.20 1.19 1.28  CALCIUM 8.4  --  8.6 7.9*   Liver Function Tests:  Recent Labs Lab 03/25/14 0315  AST 15  ALT 16  ALKPHOS 95  BILITOT 0.6  PROT 7.0  ALBUMIN 3.3*   CBC:  Recent Labs Lab 03/25/14 0315 03/25/14 1907 03/26/14 0251  WBC 6.8  --  8.4  NEUTROABS 4.5  --   --   HGB 13.9 14.3 13.9  HCT 41.2 42.0 41.5  MCV 83.2  --  84.3  PLT 179  --  202   Cardiac Enzymes:  Recent Labs Lab 03/26/14 0405 03/26/14 1115 03/26/14 1616  TROPONINI 0.09* 0.32* 0.27*   CBG:  Recent Labs Lab 03/27/14 0734 03/27/14 1238 03/27/14 1653 03/27/14 2209 03/28/14 0552  GLUCAP 112* 141* 81 127* 105*    Signed:  Barton Dubois  [facility] 03/28/2014, 10:21 AM

## 2014-03-28 NOTE — Progress Notes (Addendum)
Cab voucher given for transportation home and prescriptions given for lipitor, coreg, bidil, lisinopril, plavix, & metformin.  All d/c instructions given and reviewed and pt stated understanding.

## 2014-03-28 NOTE — Progress Notes (Signed)
Medicare IM given.  NCM spoke to pt and [facility] for [facility] post dc follow up appt. States he understands to follow up with clinic this week. States he has glucometer at home. Will purchase a scale to check weight daily. NCM reviewed medications with pt that are in his bag in the room. Encouraged him to use a pill planner. Wife at home to assist him with taking meds on schedule. Pt requesting RW for home. [facility] notified for RW for scheduled dc home today. Isidoro DonningAlesia Elouise Divelbiss RN CCM Case Mgmt phone [phone]

## 2014-03-28 NOTE — Progress Notes (Signed)
Medicare Important Message given? YES  Date Medicare IM given:  03/28/2014 Medicare IM given by: Lacretia Tindall  

## 2015-04-04 ENCOUNTER — Encounter (HOSPITAL_COMMUNITY): Payer: Self-pay | Admitting: Emergency Medicine

## 2015-04-04 ENCOUNTER — Inpatient Hospital Stay (HOSPITAL_COMMUNITY)
Admission: EM | Admit: 2015-04-04 | Discharge: 2015-04-08 | DRG: 247 | Disposition: A | Discharge status: Home / Self Care | Payer: Medicare Other | Attending: Cardiology | Admitting: Cardiology

## 2015-04-04 ENCOUNTER — Emergency Department (HOSPITAL_COMMUNITY): Payer: Medicare Other

## 2015-04-04 DIAGNOSIS — Z23 Encounter for immunization: Secondary | ICD-10-CM | POA: Diagnosis not present

## 2015-04-04 DIAGNOSIS — I119 Hypertensive heart disease without heart failure: Secondary | ICD-10-CM | POA: Diagnosis present

## 2015-04-04 DIAGNOSIS — I2510 Atherosclerotic heart disease of native coronary artery without angina pectoris: Secondary | ICD-10-CM | POA: Diagnosis present

## 2015-04-04 DIAGNOSIS — Z8249 Family history of ischemic heart disease and other diseases of the circulatory system: Secondary | ICD-10-CM | POA: Diagnosis not present

## 2015-04-04 DIAGNOSIS — I10 Essential (primary) hypertension: Secondary | ICD-10-CM | POA: Diagnosis not present

## 2015-04-04 DIAGNOSIS — E7800 Pure hypercholesterolemia, unspecified: Secondary | ICD-10-CM | POA: Diagnosis present

## 2015-04-04 DIAGNOSIS — Z955 Presence of coronary angioplasty implant and graft: Secondary | ICD-10-CM

## 2015-04-04 DIAGNOSIS — R0789 Other chest pain: Secondary | ICD-10-CM | POA: Diagnosis not present

## 2015-04-04 DIAGNOSIS — E785 Hyperlipidemia, unspecified: Secondary | ICD-10-CM | POA: Diagnosis present

## 2015-04-04 DIAGNOSIS — I213 ST elevation (STEMI) myocardial infarction of unspecified site: Secondary | ICD-10-CM | POA: Diagnosis not present

## 2015-04-04 DIAGNOSIS — Z7982 Long term (current) use of aspirin: Secondary | ICD-10-CM | POA: Diagnosis not present

## 2015-04-04 DIAGNOSIS — I161 Hypertensive emergency: Secondary | ICD-10-CM | POA: Diagnosis present

## 2015-04-04 DIAGNOSIS — Z7902 Long term (current) use of antithrombotics/antiplatelets: Secondary | ICD-10-CM

## 2015-04-04 DIAGNOSIS — I214 Non-ST elevation (NSTEMI) myocardial infarction: Secondary | ICD-10-CM | POA: Diagnosis not present

## 2015-04-04 DIAGNOSIS — I160 Hypertensive urgency: Secondary | ICD-10-CM | POA: Diagnosis not present

## 2015-04-04 DIAGNOSIS — E119 Type 2 diabetes mellitus without complications: Secondary | ICD-10-CM | POA: Diagnosis present

## 2015-04-04 DIAGNOSIS — I25810 Atherosclerosis of coronary artery bypass graft(s) without angina pectoris: Secondary | ICD-10-CM | POA: Diagnosis present

## 2015-04-04 DIAGNOSIS — I25110 Atherosclerotic heart disease of native coronary artery with unstable angina pectoris: Secondary | ICD-10-CM | POA: Diagnosis present

## 2015-04-04 DIAGNOSIS — Z9119 Patient's noncompliance with other medical treatment and regimen: Secondary | ICD-10-CM

## 2015-04-04 DIAGNOSIS — R079 Chest pain, unspecified: Secondary | ICD-10-CM | POA: Diagnosis not present

## 2015-04-04 DIAGNOSIS — T82855A Stenosis of coronary artery stent, initial encounter: Secondary | ICD-10-CM | POA: Diagnosis present

## 2015-04-04 DIAGNOSIS — Z87891 Personal history of nicotine dependence: Secondary | ICD-10-CM | POA: Diagnosis not present

## 2015-04-04 DIAGNOSIS — Z91199 Patient's noncompliance with other medical treatment and regimen due to unspecified reason: Secondary | ICD-10-CM

## 2015-04-04 DIAGNOSIS — E0801 Diabetes mellitus due to underlying condition with hyperosmolarity with coma: Secondary | ICD-10-CM | POA: Diagnosis not present

## 2015-04-04 LAB — HEPATIC FUNCTION PANEL
ALT: 14 U/L — AB (ref 17–63)
AST: 31 U/L (ref 15–41)
Albumin: 3 g/dL — ABNORMAL LOW (ref 3.5–5.0)
Alkaline Phosphatase: 68 U/L (ref 38–126)
BILIRUBIN INDIRECT: 0.9 mg/dL (ref 0.3–0.9)
Bilirubin, Direct: 0.1 mg/dL (ref 0.1–0.5)
TOTAL PROTEIN: 6.7 g/dL (ref 6.5–8.1)
Total Bilirubin: 1 mg/dL (ref 0.3–1.2)

## 2015-04-04 LAB — BASIC METABOLIC PANEL
ANION GAP: 9 (ref 5–15)
BUN: 16 mg/dL (ref 6–20)
CALCIUM: 8.7 mg/dL — AB (ref 8.9–10.3)
CO2: 23 mmol/L (ref 22–32)
Chloride: 100 mmol/L — ABNORMAL LOW (ref 101–111)
Creatinine, Ser: 1.16 mg/dL (ref 0.61–1.24)
GLUCOSE: 145 mg/dL — AB (ref 65–99)
POTASSIUM: 4 mmol/L (ref 3.5–5.1)
SODIUM: 132 mmol/L — AB (ref 135–145)

## 2015-04-04 LAB — CBC
HEMATOCRIT: 41.8 % (ref 39.0–52.0)
HEMOGLOBIN: 13.9 g/dL (ref 13.0–17.0)
MCH: 27.4 pg (ref 26.0–34.0)
MCHC: 33.3 g/dL (ref 30.0–36.0)
MCV: 82.4 fL (ref 78.0–100.0)
Platelets: 173 10*3/uL (ref 150–400)
RBC: 5.07 MIL/uL (ref 4.22–5.81)
RDW: 15.1 % (ref 11.5–15.5)
WBC: 8.2 10*3/uL (ref 4.0–10.5)

## 2015-04-04 LAB — TROPONIN I
TROPONIN I: 4.02 ng/mL — AB (ref <0.031)
TROPONIN I: 8.01 ng/mL — AB (ref <0.031)

## 2015-04-04 LAB — I-STAT TROPONIN, ED: TROPONIN I, POC: 1.03 ng/mL — AB (ref 0.00–0.08)

## 2015-04-04 LAB — GLUCOSE, CAPILLARY
GLUCOSE-CAPILLARY: 169 mg/dL — AB (ref 65–99)
Glucose-Capillary: 92 mg/dL (ref 65–99)

## 2015-04-04 LAB — MAGNESIUM: Magnesium: 1.7 mg/dL (ref 1.7–2.4)

## 2015-04-04 LAB — TSH: TSH: 1.579 u[IU]/mL (ref 0.350–4.500)

## 2015-04-04 LAB — HEPARIN LEVEL (UNFRACTIONATED): Heparin Unfractionated: 0.34 IU/mL (ref 0.30–0.70)

## 2015-04-04 LAB — CBG MONITORING, ED: GLUCOSE-CAPILLARY: 114 mg/dL — AB (ref 65–99)

## 2015-04-04 MED ORDER — ASPIRIN EC 81 MG PO TBEC
81.0000 mg | DELAYED_RELEASE_TABLET | Freq: Every day | ORAL | Status: DC
  Administered 2015-04-06: 81 mg via ORAL
  Filled 2015-04-04: qty 1

## 2015-04-04 MED ORDER — SODIUM CHLORIDE 0.9 % IV SOLN: 250.0000 mL | INTRAVENOUS | Status: DC | PRN

## 2015-04-04 MED ORDER — ASPIRIN EC 81 MG PO TBEC: 81.0000 mg | DELAYED_RELEASE_TABLET | Freq: Every day | ORAL | Status: DC

## 2015-04-04 MED ORDER — NITROGLYCERIN 0.4 MG SL SUBL: 0.4000 mg | SUBLINGUAL_TABLET | SUBLINGUAL | Status: DC | PRN

## 2015-04-04 MED ORDER — ACETAMINOPHEN 325 MG PO TABS: 650.0000 mg | ORAL_TABLET | ORAL | Status: DC | PRN

## 2015-04-04 MED ORDER — ALPRAZOLAM 0.25 MG PO TABS
0.2500 mg | ORAL_TABLET | Freq: Two times a day (BID) | ORAL | Status: DC | PRN
  Administered 2015-04-06: 0.25 mg via ORAL
  Filled 2015-04-04: qty 1

## 2015-04-04 MED ORDER — ASPIRIN 81 MG PO CHEW
81.0000 mg | CHEWABLE_TABLET | ORAL | Status: AC
  Administered 2015-04-05: 81 mg via ORAL
  Filled 2015-04-04: qty 1

## 2015-04-04 MED ORDER — INSULIN ASPART 100 UNIT/ML ~~LOC~~ SOLN: 0.0000 [IU] | Freq: Every day | SUBCUTANEOUS | Status: DC

## 2015-04-04 MED ORDER — INSULIN ASPART 100 UNIT/ML ~~LOC~~ SOLN
0.0000 [IU] | Freq: Three times a day (TID) | SUBCUTANEOUS | Status: DC
  Administered 2015-04-05 – 2015-04-06 (×2): 1 [IU] via SUBCUTANEOUS
  Administered 2015-04-07: 2 [IU] via SUBCUTANEOUS
  Administered 2015-04-07: 1 [IU] via SUBCUTANEOUS

## 2015-04-04 MED ORDER — ONDANSETRON HCL 4 MG/2ML IJ SOLN: 4.0000 mg | Freq: Four times a day (QID) | INTRAMUSCULAR | Status: DC | PRN

## 2015-04-04 MED ORDER — INSULIN ASPART 100 UNIT/ML ~~LOC~~ SOLN: 0.0000 [IU] | SUBCUTANEOUS | Status: DC

## 2015-04-04 MED ORDER — ISOSORB DINITRATE-HYDRALAZINE 20-37.5 MG PO TABS
1.0000 | ORAL_TABLET | Freq: Two times a day (BID) | ORAL | Status: DC
  Administered 2015-04-04: 1 via ORAL
  Filled 2015-04-04: qty 1

## 2015-04-04 MED ORDER — HEPARIN BOLUS VIA INFUSION
4000.0000 [IU] | Freq: Once | INTRAVENOUS | Status: AC
  Administered 2015-04-04: 4000 [IU] via INTRAVENOUS
  Filled 2015-04-04: qty 4000

## 2015-04-04 MED ORDER — SODIUM CHLORIDE 0.9 % WEIGHT BASED INFUSION: 1.0000 mL/kg/h | INTRAVENOUS | Status: DC

## 2015-04-04 MED ORDER — ZOLPIDEM TARTRATE 5 MG PO TABS: 5.0000 mg | ORAL_TABLET | Freq: Every evening | ORAL | Status: DC | PRN

## 2015-04-04 MED ORDER — LISINOPRIL 20 MG PO TABS
20.0000 mg | ORAL_TABLET | Freq: Every day | ORAL | Status: DC
  Administered 2015-04-04: 20 mg via ORAL
  Filled 2015-04-04: qty 1

## 2015-04-04 MED ORDER — SODIUM CHLORIDE 0.9 % IV SOLN: INTRAVENOUS | Status: DC

## 2015-04-04 MED ORDER — CLOPIDOGREL BISULFATE 75 MG PO TABS: 75.0000 mg | ORAL_TABLET | Freq: Every day | ORAL | Status: DC

## 2015-04-04 MED ORDER — NITROGLYCERIN 0.4 MG SL SUBL
0.4000 mg | SUBLINGUAL_TABLET | SUBLINGUAL | Status: DC | PRN
  Administered 2015-04-06 (×4): 0.4 mg via SUBLINGUAL
  Filled 2015-04-04 (×2): qty 1

## 2015-04-04 MED ORDER — SODIUM CHLORIDE 0.9% FLUSH: 3.0000 mL | INTRAVENOUS | Status: DC | PRN

## 2015-04-04 MED ORDER — CARVEDILOL 12.5 MG PO TABS
25.0000 mg | ORAL_TABLET | Freq: Two times a day (BID) | ORAL | Status: DC
  Administered 2015-04-04: 25 mg via ORAL
  Filled 2015-04-04: qty 2

## 2015-04-04 MED ORDER — CARVEDILOL 12.5 MG PO TABS
12.5000 mg | ORAL_TABLET | Freq: Two times a day (BID) | ORAL | Status: DC
  Administered 2015-04-05 – 2015-04-06 (×3): 12.5 mg via ORAL
  Filled 2015-04-04 (×3): qty 1

## 2015-04-04 MED ORDER — SODIUM CHLORIDE 0.9% FLUSH
3.0000 mL | Freq: Two times a day (BID) | INTRAVENOUS | Status: DC
  Administered 2015-04-04 – 2015-04-06 (×4): 3 mL via INTRAVENOUS

## 2015-04-04 MED ORDER — CLOPIDOGREL BISULFATE 75 MG PO TABS
75.0000 mg | ORAL_TABLET | Freq: Every day | ORAL | Status: DC
  Administered 2015-04-05 – 2015-04-06 (×2): 75 mg via ORAL
  Filled 2015-04-04 (×2): qty 1

## 2015-04-04 MED ORDER — HEPARIN (PORCINE) IN NACL 100-0.45 UNIT/ML-% IJ SOLN
1400.0000 [IU]/h | INTRAMUSCULAR | Status: DC
  Administered 2015-04-04 – 2015-04-06 (×2): 1400 [IU]/h via INTRAVENOUS
  Filled 2015-04-04 (×4): qty 250

## 2015-04-04 MED ORDER — ATORVASTATIN CALCIUM 80 MG PO TABS
80.0000 mg | ORAL_TABLET | Freq: Every day | ORAL | Status: DC
  Administered 2015-04-04 – 2015-04-05 (×2): 80 mg via ORAL
  Filled 2015-04-04 (×2): qty 1

## 2015-04-04 MED ORDER — SODIUM CHLORIDE 0.9 % WEIGHT BASED INFUSION: 3.0000 mL/kg/h | INTRAVENOUS | Status: AC

## 2015-04-04 MED ORDER — NITROGLYCERIN IN D5W 200-5 MCG/ML-% IV SOLN
5.0000 ug/min | INTRAVENOUS | Status: DC
  Administered 2015-04-04: 5 ug/min via INTRAVENOUS
  Filled 2015-04-04: qty 250

## 2015-04-04 MED ORDER — ONDANSETRON HCL 4 MG/2ML IJ SOLN
4.0000 mg | Freq: Once | INTRAMUSCULAR | Status: AC
  Administered 2015-04-04: 4 mg via INTRAVENOUS
  Filled 2015-04-04: qty 2

## 2015-04-04 NOTE — H&P (Signed)
Darrell [name] is an 60 y.o. male.    Primary Cardiologist:previously Dr. Percival Spanish in 2014 PCP :Minerva Ends, MD  Chief Complaint: chest pain HPI: asked to see 60 year old male with hx of CAD s/p CABG (LIMA to LAD, RA to OM, SVG to PDA/LP 100, SVG to D 100, diabetes, hypertension, medication noncompliance for elevated troponin.  Recent admit  2/26-2/29/16 for [facility] urgency and elevated troponin pk troponin that admit was 0.32.  Last cath  from 09/2012 showed normal LV function with an ejection fraction of 60-65% without segmental wall motion abnormalities. Also showed significant native coronary disease with total occlusion of the proximal LAD, diffuse disease in the LCx with 70% proximal stenosis and 90% distal stenosis. RCA had total proximal occlusion w/ birdging collaterals and an occluded vein graft which reportedly had supplied the PDA and PLA vessels. There was a patent radial graft which supplies the LCx territory and gives rise to 3 branches and collateralizes distal RCA and possible diagonal vessels. Lastly, showed a patent LIMA to LAD with filling retrograde back up to the point of proximal occlusion. The distal LAD is apparently quite diminutive and has diffuse narrowing with collateralization to the distal RCA. The decision was made to continue only medical therapy.    Echo 03/27/14 with EF 60%, LA moderately dilated. Tech. limited study.     Now admitted by EMS with chest apin. Took ASA7-8 81 mg  prior to arrival.   Initial BP 240/150 with 2 NTG 227/154.  EKG without acute changes but Troponin 1.03.  BP elevated but improved 188/118.  Pt pain free currently.   Pt stated he has had for over 6 months chest pain with activity, no SOB.  So he decreased his activity.  He was d/c'd  02/2014 with meds but there were no refills and he only took for 1 month.  Never saw MD as outpt.     Past Medical History  Diagnosis Date  . Coronary artery disease     a. 08/2007 s/p DES to  RCA/RI;  b. 07/2006 s/p DES to p/d LCX;  c. 11/2007 CABGx5 (LIMA->LAD, VG->D1, LRA->OM1, VG->PDA->LPL;  d. 09/2012 STEMI/Cath: LM nl, LAD 100p, LCX 70p/90d, OM1 95, LPL 90d, RCA 100, LIMA->LAD nl (LAD 95d), RA->OM nl, VG->PDA->LPL 100, unable to locate VG->Diag, EF 60-65%->Med Rx.  Marland Kitchen Hypertension   . Diabetes mellitus without complication ([place])     a. A1c 7.0 09/2012.  Marland Kitchen Noncompliance   . Hyperlipidemia   . Claudication [facility])     Past Surgical History  Procedure Laterality Date  . Coronary artery bypass graft      2009 LIMA to LAD, SVG to Diag, SVG to PDA and PL, left radial to OM  . Left heart cath N/A 10/24/2012    Procedure: LEFT HEART CATH;  Surgeon: Troy Sine, MD;  Location: [facility] CATH LAB;  Service: Cardiovascular;  Laterality: N/A;    Family History  Problem Relation Age of Onset  . Heart failure Mother   . Cancer Mother   . Heart failure Father   . CAD Mother 62  . Kidney failure Brother    Social History:  reports that he quit smoking about 4 years ago. He does not have any smokeless tobacco history on file. He reports that he does not drink alcohol or use illicit drugs.  Allergies: No Known Allergies  OUTPATIENT MEDICATIONS: None none in a year.  A year ago pt  had plavix 75 daily, metformin 500 daily lisinopril 20 mg daily, atorvastatin 10 mg daily, coreg 12.5 bid  Results for orders placed or performed during the hospital encounter of 04/04/15 (from the past 48 hour(s))  Basic metabolic panel     Status: Abnormal   Collection Time: 04/04/15 10:40 AM  Result Value Ref Range   Sodium 132 (L) 135 - 145 mmol/L   Potassium 4.0 3.5 - 5.1 mmol/L   Chloride 100 (L) 101 - 111 mmol/L   CO2 23 22 - 32 mmol/L   Glucose, Bld 145 (H) 65 - 99 mg/dL   BUN 16 6 - 20 mg/dL   Creatinine, Ser 1.16 0.61 - 1.24 mg/dL   Calcium 8.7 (L) 8.9 - 10.3 mg/dL   GFR calc non Af Amer >60 >60 mL/min   GFR calc Af Amer >60 >60 mL/min    Comment: (NOTE) The eGFR has been calculated using the  CKD EPI equation. This calculation has not been validated in all clinical situations. eGFR's persistently <60 mL/min signify possible Chronic Kidney Disease.    Anion gap 9 5 - 15  CBC     Status: None   Collection Time: 04/04/15 10:40 AM  Result Value Ref Range   WBC 8.2 4.0 - 10.5 K/uL   RBC 5.07 4.22 - 5.81 MIL/uL   Hemoglobin 13.9 13.0 - 17.0 g/dL   HCT 41.8 39.0 - 52.0 %   MCV 82.4 78.0 - 100.0 fL   MCH 27.4 26.0 - 34.0 pg   MCHC 33.3 30.0 - 36.0 g/dL   RDW 15.1 11.5 - 15.5 %   Platelets 173 150 - 400 K/uL  I-stat troponin, ED (not at [facility], [facility])     Status: Abnormal   Collection Time: 04/04/15 10:44 AM  Result Value Ref Range   Troponin i, poc 1.03 (HH) 0.00 - 0.08 ng/mL   Comment NOTIFIED PHYSICIAN    Comment 3            Comment: Due to the release kinetics of cTnI, a negative result within the first hours of the onset of symptoms does not rule out myocardial infarction with certainty. If myocardial infarction is still suspected, repeat the test at appropriate intervals.    Dg Chest 2 View  04/04/2015  CLINICAL DATA:  Midline chest pain starting this morning EXAM: CHEST  2 VIEW COMPARISON:  10/24/2012 FINDINGS: Cardiomediastinal silhouette is stable. Status post CABG. Mild elevation of the right hemidiaphragm again noted. No acute infiltrate or pleural effusion. No pulmonary edema. Bony thorax is stable. IMPRESSION: No active cardiopulmonary disease.  Status post CABG. Electronically Signed   By: Lahoma Crocker M.D.   On: 04/04/2015 11:12    ROS: General:no colds or fevers, no weight changes Skin:+ rash on feet no ulcers HEENT:no blurred vision, no congestion CV:see HPI PUL:see HPI GI:no diarrhea constipation or melena, no indigestion GU:no hematuria, no dysuria MS:no joint pain, no claudication Neuro:no syncope, no lightheadedness Endo:hx of diabetes, no thyroid disease   Blood pressure 188/118, pulse 90, temperature 98.5 F (36.9 C), temperature source Oral, resp.  rate 14, height '6\' 1"'$  (1.854 m), weight 234 lb (106.142 kg), SpO2 99 %. PE: General:Pleasant affect- but flat, NAD Skin:Warm and dry, brisk capillary refill HEENT:normocephalic, sclera clear, mucus membranes moist Neck:supple, no JVD, no bruits  Heart:S1S2 RRR without murmur, gallup, rub or click Lungs:clear without rales, rhonchi, or wheezes AYT:KZSW, non tender, + BS, do not palpate liver spleen or masses Ext:no lower ext edema, 2+ pedal  pulses, 2+ radial pulses, petechial rash of feet.  Neuro:alert and oriented X 3, MAE, follows commands, + facial symmetry    Assessment/Plan 1. NSTEMI with elevated troponin, now on IV heparin and IV NTG pain free.  Admit to stepdown and check serial troponin, continue to control BP -known hx CABG 2009 with last cath 2014 with 2/5 patent grafts. - BP severely elevated due to no meds.   This may be bumping troponin and have caused exertional chest pain for months.   2. Hypertensive urgency - now improved with NTG has been off BP meds for 11 months.  Will resume and titrate and have care manager assist.  Pt has rec'd coreg 25, bidil, lisinopril 20 and ntg drip.    3. CAD with CABG 2009 and known graft occl. 2/5 in 2014.    3. Hyperlipidemia check lab  4. DM-2 no meds for  11 months  5. Noncompliance - Cecilie Kicks R Nurse Practitioner Certified [place] Pager [phone] or after 5pm or weekends call [phone] 04/04/2015, 11:39 AM As above, patient seen and examined. Briefly he is a 60 year old male with past medical history of coronary artery disease status post coronary artery bypass graft, diabetes mellitus, hypertension, noncompliance with non-ST elevation myocardial infarction. Patient has not taking his medications in quite some time as he states he ran out. He complains of recent onset chest pain with walking outside relieved with rest. Over the past 2 days he has also had some rest symptoms. The pain is substernal radiating  to his back and described as a sharp pain. It lasts several minutes and resolves. Not pleuritic or positional. Similar to his previous cardiac pain by his report. Troponin 1.03 Patient's initial blood pressure 240/150. In the emergency room he was given carvedilol 25 mg, BiDil and 20 mg of lisinopril. He was also started on IV nitroglycerin. Above was given prior to my arrival. Initial electrocardiogram shows sinus rhythm, left ventricular hypertrophy with repolarization abnormality, cannot rule out prior septal infarct. Second electrocardiogram during nausea showed junctional rhythm with right bundle branch block and LAFB. 1 non-ST elevation myocardial infarction-the patient's symptoms are somewhat atypical but his troponin is abnormal. Plan aspirin, heparin and carvedilol. Add statin. He will require cardiac catheterization. The risks and benefits were discussed and he agrees to proceed. 2 hypertensive urgency-the patient was significantly hypertensive on arrival. Prior to my arrival he was given carvedilol, bilateral, lisinopril and nitroglycerin. We will hold any further pressure medications at this point as I'm concerned about lowering his blood pressure too quickly and the risk of decreased cerebral perfusion. We will resume carvedilol 12.5 mg twice a day tomorrow morning if blood pressure and heart rate stable. Further adjustment blood pressure medications based on follow-up readings. 3 noncompliance-I've instructed the patient on the importance of complying with medications. 4 Hyperlipidemia-add Lipitor 80 mg daily. 5 diabetes mellitus-follow CBGs and adjust regimen as needed.  [facility] Parents

## 2015-04-04 NOTE — ED Notes (Signed)
Doctor at bedside.

## 2015-04-04 NOTE — ED Provider Notes (Signed)
CSN: [id number]     Arrival date & time 04/04/15  0957 History   First MD Initiated Contact with Patient 04/04/15 1009     Chief Complaint  Patient presents with  . Chest Pain     (Consider location/radiation/quality/duration/timing/severity/associated sxs/prior Treatment) HPI   Darrell [name] is a 60 y.o. male who presents for evaluation of chest pain. Chest pain comes and goes. It is sharp in nature, does not improve with rest. There are no known provocation for the pain. Is transferred here by EMS gave him sublingual nitroglycerin. He has had pain greater than 4 hours today. He reports taking 81 mg aspirin, "like candy", this morning. EMS report that they gave him sublingual nitroglycerin with resolution of pain. He is currently not having pain at the time of evaluation in the emergency department. Patient states he last took his medications about 1 year ago, and ran out because he could not get back to his primary care doctor's office. He does state he is able to get food because his wife goes out and gets food for him. She is not currently with him. He denies fever, cough, dysuria, or constipation. There are no other known modifying factors.   Past Medical History  Diagnosis Date  . Coronary artery disease     a. 08/2007 s/p DES to RCA/RI;  b. 07/2006 s/p DES to p/d LCX;  c. 11/2007 CABGx5 (LIMA->LAD, VG->D1, LRA->OM1, VG->PDA->LPL;  d. 09/2012 STEMI/Cath: LM nl, LAD 100p, LCX 70p/90d, OM1 95, LPL 90d, RCA 100, LIMA->LAD nl (LAD 95d), RA->OM nl, VG->PDA->LPL 100, unable to locate VG->Diag, EF 60-65%->Med Rx.  Marland Kitchen Hypertension   . Diabetes mellitus without complication (HCC)     a. A1c 7.0 09/2012.  Marland Kitchen Noncompliance   . Hyperlipidemia   . Claudication [facility])    Past Surgical History  Procedure Laterality Date  . Coronary artery bypass graft      2009 LIMA to LAD, SVG to Diag, SVG to PDA and PL, left radial to OM  . Left heart cath N/A 10/24/2012    Procedure: LEFT HEART CATH;  Surgeon:  Lennette Bihari, MD;  Location: [facility] CATH LAB;  Service: Cardiovascular;  Laterality: N/A;   Family History  Problem Relation Age of Onset  . Heart failure Mother   . Cancer Mother   . Heart failure Father   . CAD Mother 7  . Kidney failure Brother    Social History  Substance Use Topics  . Smoking status: Former Smoker    Quit date: 10/25/2010  . Smokeless tobacco: None  . Alcohol Use: No    Review of Systems  All other systems reviewed and are negative.     Allergies  Review of patient's allergies indicates no known allergies.  Home Medications   Prior to Admission medications   Medication Sig Start Date End Date Taking? Authorizing Provider  aspirin EC 81 MG EC tablet Take 1 tablet (81 mg total) by mouth daily. 10/28/12  Yes Ok Anis, NP  atorvastatin (LIPITOR) 40 MG tablet Take 1 tablet (40 mg total) by mouth daily. 03/28/14  Yes Vassie Loll, MD  carvedilol (COREG) 25 MG tablet Take 1 tablet (25 mg total) by mouth 2 (two) times daily with a meal. 03/28/14  Yes Vassie Loll, MD  clopidogrel (PLAVIX) 75 MG tablet Take 1 tablet (75 mg total) by mouth daily. 03/28/14  Yes Vassie Loll, MD  glucose monitoring kit (FREESTYLE) monitoring kit 1 each by Does not apply route once.  Glucometer with test strips and lancets to check blood glucose BID ac breakfast and dinner. 10/28/12  Yes Rogelia Mire, NP  isosorbide-hydrALAZINE (BIDIL) 20-37.5 MG per tablet Take 1 tablet by mouth 2 (two) times daily. 03/28/14  Yes Barton Dubois, MD  lisinopril (PRINIVIL,ZESTRIL) 20 MG tablet Take 1 tablet (20 mg total) by mouth daily. 03/28/14  Yes Barton Dubois, MD  metFORMIN (GLUCOPHAGE) 500 MG tablet Take 1 tablet (500 mg total) by mouth 2 (two) times daily with a meal. 03/28/14  Yes Barton Dubois, MD  nitroGLYCERIN (NITROSTAT) 0.4 MG SL tablet Place 1 tablet (0.4 mg total) under the tongue every 5 (five) minutes as needed for chest pain. 10/28/12  Yes Rogelia Mire, NP   BP  155/105 mmHg  Pulse 82  Temp(Src) 98.5 F (36.9 C) (Oral)  Resp 13  Ht '6\' 1"'$  (1.854 m)  Wt 234 lb (106.142 kg)  BMI 30.88 kg/m2  SpO2 99% Physical Exam  Constitutional: He is oriented to person, place, and time. He appears well-developed. No distress.  He appears older than stated age. He is disheveled.  HENT:  Head: Normocephalic and atraumatic.  Right Ear: External ear normal.  Left Ear: External ear normal.  Eyes: Conjunctivae and EOM are normal. Pupils are equal, round, and reactive to light.  Neck: Normal range of motion and phonation normal. Neck supple.  Cardiovascular: Normal rate, regular rhythm and normal heart sounds.   Pulmonary/Chest: Effort normal and breath sounds normal. No respiratory distress. He has no wheezes. He exhibits no tenderness and no bony tenderness.  Abdominal: Soft. There is no tenderness.  Musculoskeletal: Normal range of motion. He exhibits edema (1+ lower leg edema bilaterally).  Neurological: He is alert and oriented to person, place, and time. No cranial nerve deficit or sensory deficit. He exhibits normal muscle tone. Coordination normal.  Skin: Skin is warm, dry and intact.  Psychiatric: He has a normal mood and affect. His behavior is normal. Judgment and thought content normal.  Nursing note and vitals reviewed.   ED Course  Procedures (including critical care time)  Medications  carvedilol (COREG) tablet 25 mg (25 mg Oral Given 04/04/15 1029)  isosorbide-hydrALAZINE (BIDIL) 20-37.5 MG per tablet 1 tablet (1 tablet Oral Given 04/04/15 1126)  lisinopril (PRINIVIL,ZESTRIL) tablet 20 mg (20 mg Oral Given 04/04/15 1029)  nitroGLYCERIN 50 mg in dextrose 5 % 250 mL (0.2 mg/mL) infusion (10 mcg/min Intravenous Rate/Dose Change 04/04/15 1152)  heparin ADULT infusion 100 units/mL (25000 units/250 mL) (1,400 Units/hr Intravenous New Bag/Given 04/04/15 1132)  heparin bolus via infusion 4,000 Units (4,000 Units Intravenous Given 04/04/15 1134)  ondansetron  (ZOFRAN) injection 4 mg (4 mg Intravenous Given 04/04/15 1215)    Patient Vitals for the past 24 hrs:  BP Temp Temp src Pulse Resp SpO2 Height Weight  04/04/15 1200 (!) 155/105 mmHg - - 82 13 99 % - -  04/04/15 1145 (!) 172/118 mmHg - - 87 14 99 % - -  04/04/15 1130 (!) 188/118 mmHg - - 90 14 99 % - -  04/04/15 1045 (!) 202/120 mmHg - - 91 16 100 % - -  04/04/15 1043 (!) 185/126 mmHg - - 92 16 100 % - -  04/04/15 1029 (!) 201/125 mmHg - - 99 16 100 % - -  04/04/15 1015 (!) 201/125 mmHg - - 96 17 100 % - -  04/04/15 1005 (!) 213/125 mmHg - - 92 16 100 % - -  04/04/15 1004 (!) 213/125 mmHg 98.5 F (  36.9 C) Oral 95 16 100 % '6\' 1"'$  (1.854 m) 234 lb (106.142 kg)   11:00- troponin returned elevated, at this time. Evaluation consistent with an STEMI associated with hypertensive urgency. Treatment initiated, now with nitroglycerin, heparin.  11:03- case discussed with cardiology to arrange for consultation, and likely admission. They will see the patient in the emergency department.  11:21 AM Reevaluation with update and discussion. After initial assessment and treatment, an updated evaluation reveals he continues to be pain-free, and is urinating now. Findings discussed on patient and updated on plans. Valen Mascaro L   12:20- EKG which was done at 12:11 indicated. Junctional rhythm, at 45/m. At this time. He is in sinus rhythm, on the cardiac monitor at 72. Will place pacer pads if needed for external pacing. Awaiting cardiology evaluation in the emergency department.  CRITICAL CARE Performed by: Richarda Blade Total critical care time: 50 minutes Critical care time was exclusive of separately billable procedures and treating other patients. Critical care was necessary to treat or prevent imminent or life-threatening deterioration. Critical care was time spent personally by me on the following activities: development of treatment plan with patient and/or surrogate as well as nursing, discussions  with consultants, evaluation of patient's response to treatment, examination of patient, obtaining history from patient or surrogate, ordering and performing treatments and interventions, ordering and review of laboratory studies, ordering and review of radiographic studies, pulse oximetry and re-evaluation of patient's condition.   Labs Review Labs Reviewed  BASIC METABOLIC PANEL - Abnormal; Notable for the following:    Sodium 132 (*)    Chloride 100 (*)    Glucose, Bld 145 (*)    Calcium 8.7 (*)    All other components within normal limits  I-STAT TROPOININ, ED - Abnormal; Notable for the following:    Troponin i, poc 1.03 (*)    All other components within normal limits  CBC  HEPARIN LEVEL (UNFRACTIONATED)    Imaging Review Dg Chest 2 View  04/04/2015  CLINICAL DATA:  Midline chest pain starting this morning EXAM: CHEST  2 VIEW COMPARISON:  10/24/2012 FINDINGS: Cardiomediastinal silhouette is stable. Status post CABG. Mild elevation of the right hemidiaphragm again noted. No acute infiltrate or pleural effusion. No pulmonary edema. Bony thorax is stable. IMPRESSION: No active cardiopulmonary disease.  Status post CABG. Electronically Signed   By: Lahoma Crocker M.D.   On: 04/04/2015 11:12   I have personally reviewed and evaluated these images and lab results as part of my medical decision-making.   EKG Interpretation   Date/Time:  Tuesday April 04 2015 12:11:56 EST Ventricular Rate:  45 PR Interval:  193 QRS Duration: 160 QT Interval:  547 QTC Calculation: 473 R Axis:   88 Text Interpretation:  Junctional rhythm Right bundle branch block ST depr,  consider ischemia, anterolateral lds Since last tracing of earlier today  now in junctional rhythym  and bradycardic Confirmed by Eulis Foster  MD, Lakrisha Iseman  [phone]) on 04/04/2015 12:20:20 PM       EKG Interpretation  Date/Time:  Tuesday April 04 2015 12:11:56 EST Ventricular Rate:  45 PR Interval:  193 QRS Duration: 160 QT  Interval:  547 QTC Calculation: 473 R Axis:   88 Text Interpretation:  Junctional rhythm Right bundle branch block ST depr, consider ischemia, anterolateral lds Since last tracing of earlier today now in junctional rhythym  and bradycardic Confirmed by Eulis Foster  MD, Vira Agar ([id number]) on 04/04/2015 12:20:20 PM        MDM   Final  diagnoses:  NSTEMI (non-ST elevated myocardial infarction) [facility])  Hypertensive emergency without congestive heart failure    Hypertensive urgency, and an STEMI, with prior CABG. Likely demand ischemia, resulting in ACS.Blood pressure improved with nitroglycerin drip. The patient remained stable and did not develop chest pain in Emergency department. Transient lowering of heart rate with junctional rhythm, spontaneously resolved.  Nursing Notes Reviewed/ Care Coordinated, and agree without changes. Applicable Imaging Reviewed.  Interpretation of Laboratory Data incorporated into ED treatment  Plan: Admit  Daleen Bo, MD 04/04/15 1309

## 2015-04-04 NOTE — ED Notes (Signed)
Patient arrived via EMS onset 2 weeks ago intermittent chest pain on exertion and today chest pain at rest 4/10 stabbing pain called EMS and took own aspirin 81mg  tablets "like they were candy" Unknown amount. EMS arrived administered one nitro SL chest pain resolved.  EMS reported first BP 240/150 after nitro BP 227/154.

## 2015-04-04 NOTE — Progress Notes (Signed)
ANTICOAGULATION CONSULT NOTE - Initial Consult  Pharmacy Consult for Heparin Indication: chest pain/ACS  No Known Allergies  Patient Measurements: Height: 6\' 1"  (185.4 cm) Weight: 234 lb (106.142 kg) IBW/kg (Calculated) : 79.9 Heparin Dosing Weight: 102 kg  Vital Signs: Temp: 98.5 F (36.9 C) (03/07 1004) Temp Source: Oral (03/07 1004) BP: 202/120 mmHg (03/07 1045) Pulse Rate: 91 (03/07 1045)  Labs:  Recent Labs  04/04/15 1040  HGB 13.9  HCT 41.8  PLT 173    CrCl cannot be calculated (Patient has no serum creatinine result on file.).   Medical History: Past Medical History  Diagnosis Date  . Coronary artery disease     a. 08/2007 s/p DES to RCA/RI;  b. 07/2006 s/p DES to p/d LCX;  c. 11/2007 CABGx5 (LIMA->LAD, VG->D1, LRA->OM1, VG->PDA->LPL;  d. 09/2012 STEMI/Cath: LM nl, LAD 100p, LCX 70p/90d, OM1 95, LPL 90d, RCA 100, LIMA->LAD nl (LAD 95d), RA->OM nl, VG->PDA->LPL 100, unable to locate VG->Diag, EF 60-65%->Med Rx.  Marland Kitchen. Hypertension   . Diabetes mellitus without complication (HCC)     a. A1c 7.0 09/2012.  Marland Kitchen. Noncompliance   . Hyperlipidemia   . Claudication [facility](HCC)     Medications:   (Not in a hospital admission) Scheduled:  . carvedilol  25 mg Oral BID WC  . isosorbide-hydrALAZINE  1 tablet Oral BID  . lisinopril  20 mg Oral Daily   Infusions:  . nitroGLYCERIN      Assessment: 60yo male with history of CAD presents with CP. Pharmacy is consulted to dose heparin for ACS/chest pain.   Goal of Therapy:  Heparin level 0.3-0.7 units/ml Monitor platelets by anticoagulation protocol: Yes   Plan:  Give 4000 units bolus x 1 Start heparin infusion at 1400 units/hr Check anti-Xa level in 6 hours and daily while on heparin Continue to monitor H&H and platelets  Arlean Hoppingorey M. Newman PiesBall, PharmD, BCPS Clinical Pharmacist Pager [phone] 04/04/2015,11:07 AM

## 2015-04-04 NOTE — ED Notes (Signed)
EDP aware of troponin results. Cardiology paged in order to report results.

## 2015-04-04 NOTE — ED Notes (Signed)
Ordered pt's carb modified dinner tray.

## 2015-04-04 NOTE — Progress Notes (Signed)
ANTICOAGULATION CONSULT NOTE - Follow-up  Pharmacy Consult for Heparin Indication: chest pain/ACS  No Known Allergies  Patient Measurements: Height: '6\' 1"'$  (185.4 cm) Weight: 212 lb 9.6 oz (96.435 kg) IBW/kg (Calculated) : 79.9 Heparin Dosing Weight: 102 kg  Vital Signs: Temp: 98.5 F (36.9 C) (03/07 1900) Temp Source: Oral (03/07 1900) BP: 138/80 mmHg (03/07 1908) Pulse Rate: 72 (03/07 1908)  Labs:  Recent Labs  04/04/15 1040 04/04/15 1306 04/04/15 1734  HGB 13.9  --   --   HCT 41.8  --   --   PLT 173  --   --   HEPARINUNFRC  --   --  0.34  CREATININE 1.16  --   --   TROPONINI  --  4.02*  --     Estimated Creatinine Clearance: 84.9 mL/min (by C-G formula based on Cr of 1.16).   Medical History: Past Medical History  Diagnosis Date  . Coronary artery disease     a. 08/2007 s/p DES to RCA/RI;  b. 07/2006 s/p DES to p/d LCX;  c. 11/2007 CABGx5 (LIMA->LAD, VG->D1, LRA->OM1, VG->PDA->LPL;  d. 09/2012 STEMI/Cath: LM nl, LAD 100p, LCX 70p/90d, OM1 95, LPL 90d, RCA 100, LIMA->LAD nl (LAD 95d), RA->OM nl, VG->PDA->LPL 100, unable to locate VG->Diag, EF 60-65%->Med Rx.  Marland Kitchen Hypertension   . Diabetes mellitus without complication ([place])     a. A1c 7.0 09/2012.  Marland Kitchen Noncompliance   . Hyperlipidemia   . Claudication ([place])   . NSTEMI (non-ST elevated myocardial infarction) ([place]) 04/04/2015    Medications:  Prescriptions prior to admission  Medication Sig Dispense Refill Last Dose  . aspirin EC 81 MG EC tablet Take 1 tablet (81 mg total) by mouth daily.   04/04/2015 at Unknown time  . atorvastatin (LIPITOR) 40 MG tablet Take 1 tablet (40 mg total) by mouth daily. 30 tablet 1 unknown  . carvedilol (COREG) 25 MG tablet Take 1 tablet (25 mg total) by mouth 2 (two) times daily with a meal. 60 tablet 1 unknown  . clopidogrel (PLAVIX) 75 MG tablet Take 1 tablet (75 mg total) by mouth daily. 30 tablet 0 unknown  . glucose monitoring kit (FREESTYLE) monitoring kit 1 each by Does not apply  route once. Glucometer with test strips and lancets to check blood glucose BID ac breakfast and dinner. 1 each 0 unknown  . isosorbide-hydrALAZINE (BIDIL) 20-37.5 MG per tablet Take 1 tablet by mouth 2 (two) times daily. 60 tablet 1 unknown  . lisinopril (PRINIVIL,ZESTRIL) 20 MG tablet Take 1 tablet (20 mg total) by mouth daily. 30 tablet 1 unknown  . metFORMIN (GLUCOPHAGE) 500 MG tablet Take 1 tablet (500 mg total) by mouth 2 (two) times daily with a meal. 60 tablet 1 unknown  . nitroGLYCERIN (NITROSTAT) 0.4 MG SL tablet Place 1 tablet (0.4 mg total) under the tongue every 5 (five) minutes as needed for chest pain. 25 tablet 3 04/04/2015 at Unknown time   Scheduled:  . aspirin EC  81 mg Oral Daily  . [START ON 04/05/2015] aspirin EC  81 mg Oral Daily  . atorvastatin  80 mg Oral q1800  . [START ON 04/05/2015] carvedilol  12.5 mg Oral BID WC  . clopidogrel  75 mg Oral Daily  . clopidogrel  75 mg Oral Daily  . insulin aspart  0-5 Units Subcutaneous QHS  . insulin aspart  0-9 Units Subcutaneous TID WC  . [START ON 04/05/2015] insulin aspart  0-9 Units Subcutaneous 6 times per day   Infusions:  .  sodium chloride    . heparin 1,400 Units/hr (04/04/15 1132)  . nitroGLYCERIN Stopped (04/04/15 1325)    Assessment: 60yo male with history of CAD presents with CP. Pharmacy is consulted to dose heparin for ACS/chest pain.   first HL 0.34. Continue current rate and check confirmatory 6hr level.   Goal of Therapy:  Heparin level 0.3-0.7 units/ml Monitor platelets by anticoagulation protocol: Yes   Plan:  Continue heparin '@1400'$  units/hr 6hr HL (to confirm) Daily HL/CBC Monitor for s/s of bleeding

## 2015-04-05 ENCOUNTER — Encounter (HOSPITAL_COMMUNITY)
Admission: EM | Disposition: A | Discharge status: Home / Self Care | Payer: Self-pay | Source: Home / Self Care | Attending: Cardiology

## 2015-04-05 LAB — BASIC METABOLIC PANEL
ANION GAP: 9 (ref 5–15)
BUN: 16 mg/dL (ref 6–20)
CALCIUM: 8.9 mg/dL (ref 8.9–10.3)
CHLORIDE: 104 mmol/L (ref 101–111)
CO2: 25 mmol/L (ref 22–32)
CREATININE: 1.33 mg/dL — AB (ref 0.61–1.24)
GFR calc Af Amer: >60 mL/min (ref >60)
GFR calc non Af Amer: 57 mL/min — ABNORMAL LOW (ref >60)
GLUCOSE: 149 mg/dL — AB (ref 65–99)
Potassium: 4 mmol/L (ref 3.5–5.1)
Sodium: 138 mmol/L (ref 135–145)

## 2015-04-05 LAB — CBC
HEMATOCRIT: 37.5 % — AB (ref 39.0–52.0)
Hemoglobin: 12.5 g/dL — ABNORMAL LOW (ref 13.0–17.0)
MCH: 27.6 pg (ref 26.0–34.0)
MCHC: 33.3 g/dL (ref 30.0–36.0)
MCV: 82.8 fL (ref 78.0–100.0)
Platelets: 155 10*3/uL (ref 150–400)
RBC: 4.53 MIL/uL (ref 4.22–5.81)
RDW: 15.4 % (ref 11.5–15.5)
WBC: 7 10*3/uL (ref 4.0–10.5)

## 2015-04-05 LAB — GLUCOSE, CAPILLARY
GLUCOSE-CAPILLARY: 134 mg/dL — AB (ref 65–99)
GLUCOSE-CAPILLARY: 108 mg/dL — AB (ref 65–99)
Glucose-Capillary: 127 mg/dL — ABNORMAL HIGH (ref 65–99)
Glucose-Capillary: 104 mg/dL — ABNORMAL HIGH (ref 65–99)
Glucose-Capillary: 101 mg/dL — ABNORMAL HIGH (ref 65–99)

## 2015-04-05 LAB — TROPONIN I: Troponin I: 9.51 ng/mL (ref <0.031)

## 2015-04-05 LAB — HEMOGLOBIN A1C
Hgb A1c MFr Bld: 5.6 % (ref 4.8–5.6)
Mean Plasma Glucose: 114 mg/dL

## 2015-04-05 LAB — LIPID PANEL
Cholesterol: 154 mg/dL (ref 0–200)
HDL: 32 mg/dL — ABNORMAL LOW (ref >40)
LDL CALC: 106 mg/dL — AB (ref 0–99)
Total CHOL/HDL Ratio: 4.8 RATIO
Triglycerides: 79 mg/dL (ref <150)
VLDL: 16 mg/dL (ref 0–40)

## 2015-04-05 LAB — MRSA PCR SCREENING: MRSA BY PCR: NEGATIVE

## 2015-04-05 LAB — PROTIME-INR
INR: 1.16 (ref 0.00–1.49)
PROTHROMBIN TIME: 15 s (ref 11.6–15.2)

## 2015-04-05 LAB — HEPARIN LEVEL (UNFRACTIONATED): Heparin Unfractionated: 0.34 IU/mL (ref 0.30–0.70)

## 2015-04-05 SURGERY — LEFT HEART CATH AND CORS/GRAFTS ANGIOGRAPHY

## 2015-04-05 MED ORDER — LISINOPRIL 5 MG PO TABS
5.0000 mg | ORAL_TABLET | Freq: Every day | ORAL | Status: DC
  Administered 2015-04-05 – 2015-04-07 (×3): 5 mg via ORAL
  Filled 2015-04-05 (×4): qty 1

## 2015-04-05 NOTE — Care Management Note (Signed)
Case Management Note  Patient Details  Name: Darrell [name] MRN: [id number] Date of Birth: 1955/06/27  Subjective/Objective:     Date: 04/05/15 Spoke with patient at the bedside .  Introduced self as Sports coachcase manager and explained role in discharge planning and how to be reached.  Verified patient lives in town,  with spouse. Has a rolling walker.  Expressed potential need for no other DME.  Verified patient anticipates to go home with family, at time of discharge and will have full-time supervision by family at this time to best of their knowledge. Patient denied needing help with their medication.  Patient drives  to MD appointments.  Patient has an appt scheduled for 3/14 at 3:30 [facility]   Plan: CM will continue to follow [facility] planning and [facility].                Action/Plan:   Expected Discharge Date:                  Expected Discharge Plan:  Home w Home Health Services  In-House Referral:     Discharge planning Services  CM Consult  Post Acute Care Choice:    Choice offered to:     DME Arranged:    DME Agency:     HH Arranged:    HH Agency:     Status of Service:  In process, will continue to follow  Medicare Important Message Given:    Date Medicare IM Given:    Medicare IM give by:    Date Additional Medicare IM Given:    Additional Medicare Important Message give by:     If discussed at Long Length of Stay Meetings, dates discussed:    Additional Comments:  Leone Havenaylor, Fritz Cauthon [place], RN 04/05/2015, 2:15 PM

## 2015-04-05 NOTE — Progress Notes (Signed)
Paged Dr. Jens Somrenshaw times 2. Pt refusing heart cath but wanting to eat. Awaiting new orders. Will continue to monitor pt.

## 2015-04-05 NOTE — Progress Notes (Signed)
Upon entering room to prep patient for Cath he endorsed he has decided to decline heart catheterization stating "they said there was a chance of stroke and I don't want no stroke". Pt educated about complications of forgoing heart catheterization including stroke but still declined. Will convey to Cath Team this AM.

## 2015-04-05 NOTE — Progress Notes (Signed)
    Subjective:  Denies CP or dyspnea   Objective:  Filed Vitals:   04/04/15 2340 04/05/15 0425 04/05/15 0500 04/05/15 0730  BP: 136/82 130/84  153/95  Pulse: 65 75  85  Temp:  98.3 F (36.8 C)  98.1 F (36.7 C)  TempSrc:  Oral  Oral  Resp: 20 26  21   Height:      Weight:   211 lb 13.8 oz (96.1 kg)   SpO2: 98% 99%  97%    Intake/Output from previous day:  Intake/Output Summary (Last 24 hours) at 04/05/15 1103 Last data filed at 04/05/15 0730  Gross per 24 hour  Intake 272.53 ml  Output   1600 ml  Net -1327.47 ml    Physical Exam: Physical exam: Well-developed well-nourished in no acute distress.  Skin is warm and dry.  HEENT is normal.  Neck is supple. Chest is clear to auscultation with normal expansion.  Cardiovascular exam is regular rate and rhythm.  Abdominal exam nontender or distended. No masses palpated. Extremities show no edema. neuro grossly intact    Lab Results: Basic Metabolic Panel:  Recent Labs  40/98/1101/08/13 1040 04/04/15 1306 04/05/15 0010  NA 132*  --  138  K 4.0  --  4.0  CL 100*  --  104  CO2 23  --  25  GLUCOSE 145*  --  149*  BUN 16  --  16  CREATININE 1.16  --  1.33*  CALCIUM 8.7*  --  8.9  MG  --  1.7  --    CBC:  Recent Labs  04/04/15 1040 04/05/15 0010  WBC 8.2 7.0  HGB 13.9 12.5*  HCT 41.8 37.5*  MCV 82.4 82.8  PLT 173 155   Cardiac Enzymes:  Recent Labs  04/04/15 1306 04/04/15 1734 04/05/15 0010  TROPONINI 4.02* 8.01* 9.51*     Assessment/Plan:  1 non-ST elevation myocardial infarction-Patient has ruled in for MI. Continue aspirin, plavix, heparin and carvedilol and statin. The patient was scheduled for cardiac catheterization today but he now refuses.  He states cardiac catheterization has not helped him previously. I discussed the risks and benefits of cardiac catheterization. I also discussed the risks of undiagnosed coronary disease including recurrent myocardial infarction and death. He continues to  refuse. We will plan medical therapy.  Check echocardiogram for LV function. 2 hypertensive urgency-Blood pressure is much improved this morning. Continue beta blocker. Add lisinopril. Follow blood pressure and adjust as needed. 3 noncompliance-I've previously instructed the patient on the importance of complying with medications. 4 Hyperlipidemia-continue Lipitor 80 mg daily. 5 diabetes mellitus-follow CBGs and adjust regimen as needed.  Darrell [name] 04/05/2015, 11:03 AM

## 2015-04-05 NOTE — Progress Notes (Signed)
Paged Dr. Jens Somrenshaw regarding pt refusing heart cath and now wanting to eat. Awaiting new orders or call back. Will continue to monitor pt.

## 2015-04-05 NOTE — Progress Notes (Signed)
ANTICOAGULATION CONSULT NOTE - Follow Up Consult  Pharmacy Consult for Heparin Indication: NSTEMI  No Known Allergies  Patient Measurements: Height: 6\' 1"  (185.4 cm) Weight: 211 lb 13.8 oz (96.1 kg) IBW/kg (Calculated) : 79.9 Heparin Dosing Weight: 102 kg  Vital Signs: Temp: 98.3 F (36.8 C) (03/08 1141) Temp Source: Oral (03/08 1141) BP: 142/90 mmHg (03/08 1140) Pulse Rate: 67 (03/08 1140)  Labs:  Recent Labs  04/04/15 1040 04/04/15 1306 04/04/15 1734 04/05/15 0010  HGB 13.9  --   --  12.5*  HCT 41.8  --   --  37.5*  PLT 173  --   --  155  LABPROT  --   --   --  15.0  INR  --   --   --  1.16  HEPARINUNFRC  --   --  0.34 0.34  CREATININE 1.16  --   --  1.33*  TROPONINI  --  4.02* 8.01* 9.51*    Estimated Creatinine Clearance: 74 mL/min (by C-G formula based on Cr of 1.33).  Assessment: 60yo male with history of CAD presents with CP. Pharmacy is consulted to dose heparin for ACS/chest pain.   AC: +enzymes. HL. HL 0.34 in goal x 2.  Goal of Therapy:  Heparin level 0.3-0.7 units/ml Monitor platelets by anticoagulation protocol: Yes   Plan:  Medical therapy as patient refuses cath. Continue IV heparin at 1400 units/hr   Masiya Claassen S. Merilynn Finlandobertson, PharmD, BCPS Clinical Staff Pharmacist Pager [phone]  Misty Stanleyobertson, Billye Pickerel Stillinger 04/05/2015,2:39 PM

## 2015-04-06 ENCOUNTER — Inpatient Hospital Stay (HOSPITAL_COMMUNITY): Payer: Medicare Other

## 2015-04-06 ENCOUNTER — Encounter (HOSPITAL_COMMUNITY)
Admission: EM | Disposition: A | Discharge status: Home / Self Care | Payer: Self-pay | Source: Home / Self Care | Attending: Cardiology

## 2015-04-06 DIAGNOSIS — I2510 Atherosclerotic heart disease of native coronary artery without angina pectoris: Secondary | ICD-10-CM

## 2015-04-06 DIAGNOSIS — I10 Essential (primary) hypertension: Secondary | ICD-10-CM

## 2015-04-06 DIAGNOSIS — I213 ST elevation (STEMI) myocardial infarction of unspecified site: Secondary | ICD-10-CM

## 2015-04-06 HISTORY — PX: CARDIAC CATHETERIZATION: SHX172

## 2015-04-06 LAB — BASIC METABOLIC PANEL
Anion gap: 10 (ref 5–15)
BUN: 16 mg/dL (ref 6–20)
CHLORIDE: 107 mmol/L (ref 101–111)
CO2: 22 mmol/L (ref 22–32)
Calcium: 8.7 mg/dL — ABNORMAL LOW (ref 8.9–10.3)
Creatinine, Ser: 1.27 mg/dL — ABNORMAL HIGH (ref 0.61–1.24)
GFR calc non Af Amer: >60 mL/min (ref >60)
Glucose, Bld: 121 mg/dL — ABNORMAL HIGH (ref 65–99)
POTASSIUM: 4 mmol/L (ref 3.5–5.1)
SODIUM: 139 mmol/L (ref 135–145)

## 2015-04-06 LAB — ECHOCARDIOGRAM COMPLETE
Height: 73 in
Weight: 3386.27 oz

## 2015-04-06 LAB — POCT ACTIVATED CLOTTING TIME: Activated Clotting Time: 338 seconds

## 2015-04-06 LAB — CBC
HEMATOCRIT: 40.9 % (ref 39.0–52.0)
HEMOGLOBIN: 13.7 g/dL (ref 13.0–17.0)
MCH: 28.1 pg (ref 26.0–34.0)
MCHC: 33.5 g/dL (ref 30.0–36.0)
MCV: 84 fL (ref 78.0–100.0)
PLATELETS: 172 10*3/uL (ref 150–400)
RBC: 4.87 MIL/uL (ref 4.22–5.81)
RDW: 15.6 % — ABNORMAL HIGH (ref 11.5–15.5)
WBC: 6.5 10*3/uL (ref 4.0–10.5)

## 2015-04-06 LAB — GLUCOSE, CAPILLARY
GLUCOSE-CAPILLARY: 132 mg/dL — AB (ref 65–99)
GLUCOSE-CAPILLARY: 111 mg/dL — AB (ref 65–99)
Glucose-Capillary: 99 mg/dL (ref 65–99)
Glucose-Capillary: 89 mg/dL (ref 65–99)

## 2015-04-06 LAB — HEPARIN LEVEL (UNFRACTIONATED): Heparin Unfractionated: 0.31 IU/mL (ref 0.30–0.70)

## 2015-04-06 SURGERY — LEFT HEART CATH AND CORS/GRAFTS ANGIOGRAPHY

## 2015-04-06 MED ORDER — MORPHINE SULFATE (PF) 2 MG/ML IV SOLN
INTRAVENOUS | Status: AC
  Filled 2015-04-06: qty 1

## 2015-04-06 MED ORDER — LIDOCAINE HCL (PF) 1 % IJ SOLN
INTRAMUSCULAR | Status: DC | PRN
  Administered 2015-04-06: 30 mL

## 2015-04-06 MED ORDER — ACETAMINOPHEN 325 MG PO TABS: 650.0000 mg | ORAL_TABLET | ORAL | Status: DC | PRN

## 2015-04-06 MED ORDER — ONDANSETRON HCL 4 MG/2ML IJ SOLN: 4.0000 mg | Freq: Four times a day (QID) | INTRAMUSCULAR | Status: DC | PRN

## 2015-04-06 MED ORDER — FENTANYL CITRATE (PF) 100 MCG/2ML IJ SOLN
50.0000 ug | Freq: Once | INTRAMUSCULAR | Status: AC
  Administered 2015-04-06: 50 ug via INTRAVENOUS

## 2015-04-06 MED ORDER — ATORVASTATIN CALCIUM 80 MG PO TABS
80.0000 mg | ORAL_TABLET | Freq: Every day | ORAL | Status: DC
  Administered 2015-04-07: 80 mg via ORAL
  Filled 2015-04-06: qty 1

## 2015-04-06 MED ORDER — SODIUM CHLORIDE 0.9% FLUSH: 3.0000 mL | INTRAVENOUS | Status: DC | PRN

## 2015-04-06 MED ORDER — SODIUM CHLORIDE 0.9 % WEIGHT BASED INFUSION
3.0000 mL/kg/h | INTRAVENOUS | Status: DC
  Administered 2015-04-06: 3 mL/kg/h via INTRAVENOUS

## 2015-04-06 MED ORDER — SODIUM CHLORIDE 0.9 % IV SOLN: 250.0000 mL | INTRAVENOUS | Status: DC | PRN

## 2015-04-06 MED ORDER — BIVALIRUDIN 250 MG IV SOLR
INTRAVENOUS | Status: AC
  Filled 2015-04-06: qty 250

## 2015-04-06 MED ORDER — SODIUM CHLORIDE 0.9 % WEIGHT BASED INFUSION: 1.0000 mL/kg/h | INTRAVENOUS | Status: AC

## 2015-04-06 MED ORDER — ASPIRIN 81 MG PO CHEW
81.0000 mg | CHEWABLE_TABLET | Freq: Every day | ORAL | Status: DC
  Administered 2015-04-06 – 2015-04-08 (×3): 81 mg via ORAL
  Filled 2015-04-06 (×3): qty 1

## 2015-04-06 MED ORDER — PERFLUTREN LIPID MICROSPHERE
1.0000 mL | INTRAVENOUS | Status: AC | PRN
  Administered 2015-04-06: 2 mL via INTRAVENOUS
  Filled 2015-04-06: qty 10

## 2015-04-06 MED ORDER — ATROPINE SULFATE 0.1 MG/ML IJ SOLN
INTRAMUSCULAR | Status: AC
  Filled 2015-04-06: qty 10

## 2015-04-06 MED ORDER — SODIUM CHLORIDE 0.9% FLUSH
3.0000 mL | Freq: Two times a day (BID) | INTRAVENOUS | Status: DC
  Administered 2015-04-06 – 2015-04-07 (×3): 3 mL via INTRAVENOUS

## 2015-04-06 MED ORDER — HEPARIN (PORCINE) IN NACL 2-0.9 UNIT/ML-% IJ SOLN
INTRAMUSCULAR | Status: DC | PRN
  Administered 2015-04-06: 1500 mL

## 2015-04-06 MED ORDER — NITROGLYCERIN 0.4 MG/SPRAY TL SOLN
Status: DC | PRN
  Administered 2015-04-06: 1 via SUBLINGUAL

## 2015-04-06 MED ORDER — FENTANYL CITRATE (PF) 100 MCG/2ML IJ SOLN
INTRAMUSCULAR | Status: AC
Start: 2015-04-06 — End: 2015-04-06
  Filled 2015-04-06: qty 2

## 2015-04-06 MED ORDER — LIDOCAINE HCL (PF) 1 % IJ SOLN
INTRAMUSCULAR | Status: AC
Start: 2015-04-06 — End: 2015-04-06
  Filled 2015-04-06: qty 30

## 2015-04-06 MED ORDER — SODIUM CHLORIDE 0.9 % WEIGHT BASED INFUSION
1.0000 mL/kg/h | INTRAVENOUS | Status: DC
  Administered 2015-04-06: 1 mL/kg/h via INTRAVENOUS

## 2015-04-06 MED ORDER — FENTANYL CITRATE (PF) 100 MCG/2ML IJ SOLN
INTRAMUSCULAR | Status: DC | PRN
  Administered 2015-04-06 (×2): 25 ug via INTRAVENOUS

## 2015-04-06 MED ORDER — NITROGLYCERIN 1 MG/10 ML FOR IR/CATH LAB
INTRA_ARTERIAL | Status: AC
  Filled 2015-04-06: qty 10

## 2015-04-06 MED ORDER — FENTANYL CITRATE (PF) 100 MCG/2ML IJ SOLN
INTRAMUSCULAR | Status: AC
  Filled 2015-04-06: qty 2

## 2015-04-06 MED ORDER — METOPROLOL TARTRATE 1 MG/ML IV SOLN
INTRAVENOUS | Status: AC
  Filled 2015-04-06: qty 5

## 2015-04-06 MED ORDER — IOHEXOL 350 MG/ML SOLN
INTRAVENOUS | Status: DC | PRN
  Administered 2015-04-06: 245 mL via INTRA_ARTERIAL

## 2015-04-06 MED ORDER — MORPHINE SULFATE (PF) 2 MG/ML IV SOLN
2.0000 mg | INTRAVENOUS | Status: DC | PRN
  Administered 2015-04-06: 2 mg via INTRAVENOUS

## 2015-04-06 MED ORDER — SODIUM CHLORIDE 0.9 % IV SOLN
250.0000 mg | INTRAVENOUS | Status: DC | PRN
  Administered 2015-04-06 (×2): 1.75 mg/kg/h via INTRAVENOUS

## 2015-04-06 MED ORDER — CLOPIDOGREL BISULFATE 300 MG PO TABS
ORAL_TABLET | ORAL | Status: AC
  Filled 2015-04-06: qty 1

## 2015-04-06 MED ORDER — HYDRALAZINE HCL 20 MG/ML IJ SOLN
10.0000 mg | Freq: Four times a day (QID) | INTRAMUSCULAR | Status: DC | PRN
  Administered 2015-04-06: 10 mg via INTRAVENOUS
  Filled 2015-04-06 (×2): qty 1

## 2015-04-06 MED ORDER — NITROGLYCERIN IN D5W 200-5 MCG/ML-% IV SOLN
0.0000 ug/min | INTRAVENOUS | Status: DC
  Administered 2015-04-06: 5 ug/min via INTRAVENOUS
  Filled 2015-04-06: qty 250

## 2015-04-06 MED ORDER — MIDAZOLAM HCL 2 MG/2ML IJ SOLN
INTRAMUSCULAR | Status: AC
Start: 2015-04-06 — End: 2015-04-06
  Filled 2015-04-06: qty 2

## 2015-04-06 MED ORDER — MORPHINE SULFATE (PF) 2 MG/ML IV SOLN
1.0000 mg | INTRAVENOUS | Status: DC | PRN
  Administered 2015-04-06: 4 mg via INTRAVENOUS
  Administered 2015-04-06: 2 mg via INTRAVENOUS
  Filled 2015-04-06 (×2): qty 2

## 2015-04-06 MED ORDER — METOPROLOL TARTRATE 1 MG/ML IV SOLN
5.0000 mg | Freq: Once | INTRAVENOUS | Status: AC
  Administered 2015-04-06: 5 mg via INTRAVENOUS

## 2015-04-06 MED ORDER — ASPIRIN 81 MG PO CHEW: 81.0000 mg | CHEWABLE_TABLET | ORAL | Status: DC

## 2015-04-06 MED ORDER — CLOPIDOGREL BISULFATE 75 MG PO TABS
75.0000 mg | ORAL_TABLET | Freq: Every day | ORAL | Status: DC
  Administered 2015-04-07 – 2015-04-08 (×2): 75 mg via ORAL
  Filled 2015-04-06 (×3): qty 1

## 2015-04-06 MED ORDER — BIVALIRUDIN BOLUS VIA INFUSION - CUPID
INTRAVENOUS | Status: DC | PRN
  Administered 2015-04-06: 72 mg via INTRAVENOUS

## 2015-04-06 MED ORDER — SODIUM CHLORIDE 0.9% FLUSH: 3.0000 mL | Freq: Two times a day (BID) | INTRAVENOUS | Status: DC

## 2015-04-06 MED ORDER — HYDRALAZINE HCL 20 MG/ML IJ SOLN
10.0000 mg | INTRAMUSCULAR | Status: DC | PRN
  Administered 2015-04-06: 10 mg via INTRAVENOUS
  Filled 2015-04-06: qty 1

## 2015-04-06 MED ORDER — HYDRALAZINE HCL 20 MG/ML IJ SOLN
INTRAMUSCULAR | Status: DC | PRN
  Administered 2015-04-06: 10 mg via INTRAVENOUS

## 2015-04-06 MED ORDER — MIDAZOLAM HCL 2 MG/2ML IJ SOLN
INTRAMUSCULAR | Status: DC | PRN
  Administered 2015-04-06: 1 mg via INTRAVENOUS

## 2015-04-06 MED ORDER — CLOPIDOGREL BISULFATE 300 MG PO TABS
ORAL_TABLET | ORAL | Status: DC | PRN
  Administered 2015-04-06: 300 mg via ORAL

## 2015-04-06 MED ORDER — HYDRALAZINE HCL 20 MG/ML IJ SOLN
10.0000 mg | Freq: Once | INTRAMUSCULAR | Status: AC
  Administered 2015-04-06: 10 mg via INTRAVENOUS

## 2015-04-06 MED ORDER — HEPARIN (PORCINE) IN NACL 2-0.9 UNIT/ML-% IJ SOLN
INTRAMUSCULAR | Status: AC
  Filled 2015-04-06: qty 1500

## 2015-04-06 MED ORDER — NITROGLYCERIN 0.4 MG/SPRAY TL SOLN
Status: AC
  Filled 2015-04-06: qty 4.9

## 2015-04-06 SURGICAL SUPPLY — 21 items
BALLN EMERGE MR 2.0X12 (BALLOONS) ×3
BALLN ~~LOC~~ EMERGE MR 2.5X15 (BALLOONS) ×3
BALLN ~~LOC~~ EMERGE MR 2.75X15 (BALLOONS) ×3
BALLOON EMERGE MR 2.0X12 (BALLOONS) ×1 IMPLANT
BALLOON ~~LOC~~ EMERGE MR 2.5X15 (BALLOONS) ×1 IMPLANT
BALLOON ~~LOC~~ EMERGE MR 2.75X15 (BALLOONS) ×1 IMPLANT
CATH INFINITI 5FR JL5 (CATHETERS) ×3 IMPLANT
CATH INFINITI 5FR MULTPACK ANG (CATHETERS) ×3 IMPLANT
CATH VISTA GUIDE 6FR XB4 (CATHETERS) ×3 IMPLANT
DEVICE TORQUE .014-.018 (MISCELLANEOUS) ×1 IMPLANT
KIT ENCORE 26 ADVANTAGE (KITS) ×3 IMPLANT
KIT HEART LEFT (KITS) ×3 IMPLANT
PACK CARDIAC CATHETERIZATION (CUSTOM PROCEDURE TRAY) ×3 IMPLANT
SHEATH PINNACLE 5F 10CM (SHEATH) ×3 IMPLANT
SHEATH PINNACLE 6F 10CM (SHEATH) ×3 IMPLANT
STENT SYNERGY DES 2.5X12 (Permanent Stent) ×3 IMPLANT
STENT SYNERGY DES 2.5X16 (Permanent Stent) ×3 IMPLANT
TORQUE DEVICE .014-.018 (MISCELLANEOUS) ×3
TRANSDUCER W/STOPCOCK (MISCELLANEOUS) ×3 IMPLANT
WIRE ASAHI PROWATER 180CM (WIRE) ×3 IMPLANT
WIRE EMERALD 3MM-J .035X150CM (WIRE) ×3 IMPLANT

## 2015-04-06 NOTE — Progress Notes (Signed)
C/O 8/10 CP. 12 lead EKG done, shown to Dr. Allyson SabalBerry. Pain med ordered.

## 2015-04-06 NOTE — Progress Notes (Addendum)
Sublingual ntg given for c/o chest pain. 7/10 Will monitor patient

## 2015-04-06 NOTE — Progress Notes (Signed)
Night shift RN stated pt complaining of sharp chest pain this morning with an 8/10 pain, not radiating, MD notified.  ECG performed. Pt placed on 2L of oxygen. MD called back with new orders administration of morphine, nitro drip, and lopressor. PA at bedside, pt stated he now wants to have the cardiac cath performed.  Vitals:  BP  155/93   HR 104  Follow Up 0830:  Pt has no more complaints of chest pain, resting comfortably BP 120/80  HR 82   Will continue to monitor for changes.

## 2015-04-06 NOTE — Progress Notes (Addendum)
2nd Sublingual Ntg given for c/o chest pain 5/10

## 2015-04-06 NOTE — Progress Notes (Signed)
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  60 yo male w/ PMH of CAD (s/p CABG w/ LIMA->LAD, VG->D1, LRA->OM1, VG->PDA->LPL with last cath in 2014 showing 2/5 patent grafts), Type 2 DM, HTN, and medication noncompliance admitted on 04/04/2015 for NSTEMI.  Subjective   Patient was initially scheduled for a cardiac catheterization yesterday but refused the procedure due to "catheterizations not helping him in the past". Overnight, he had several episodes of chest pain and required SL NTG, IV Morphine, and most recently NTG drip. His EKG appears similar to previous tracings. He is willing to proceed with a cardiac catheterization now.  Inpatient Medications    . aspirin EC  81 mg Oral Daily  . atorvastatin  80 mg Oral q1800  . carvedilol  12.5 mg Oral BID WC  . clopidogrel  75 mg Oral Daily  . insulin aspart  0-5 Units Subcutaneous QHS  . insulin aspart  0-9 Units Subcutaneous TID WC  . lisinopril  5 mg Oral Daily  . metoprolol      . sodium chloride flush  3 mL Intravenous Q12H    Vital Signs    Filed Vitals:   04/06/15 0515 04/06/15 0535 04/06/15 0600 04/06/15 0717  BP: 204/125 171/111 163/103 150/89  Pulse: 91 85 75   Temp:      TempSrc:      Resp: Height:      Weight:      SpO2: 97% 95% 96%     Intake/Output Summary (Last 24 hours) at 04/06/15 0737 Last data filed at 04/06/15 0700  Gross per 24 hour  Intake    922 ml  Output   2250 ml  Net  -1328 ml   Filed Weights   04/04/15 1900 04/05/15 0500 04/06/15 0315  Weight: 212 lb 9.6 oz (96.435 kg) 211 lb 13.8 oz (96.1 kg) 211 lb 10.3 oz (96 kg)    Physical Exam    General: Well  developed, well nourished, male appearing in no acute distress. Head: Normocephalic, atraumatic.  Neck: Supple without bruits, JVD not elevated. Lungs:  Resp regular and unlabored, CTA without wheezing or rales. Heart: RRR, S1, S2, no S3, S4, or murmur; no rub. Abdomen: Soft, non-tender, non-distended with normoactive bowel sounds. No hepatomegaly. No rebound/guarding. No obvious abdominal masses. Extremities: No clubbing, cyanosis, or edema. Distal pedal pulses are 2+ bilaterally. Neuro: Alert and oriented X 3. Moves all extremities spontaneously. Psych: Normal affect.  Labs    CBC  Recent Labs  04/05/15 0010 04/06/15 0457  WBC 7.0 6.5  HGB 12.5* 13.7  HCT 37.5* 40.9  MCV 82.8 84.0  PLT 155 172   Basic Metabolic Panel  Recent Labs  04/04/15 1306 04/05/15 0010 04/06/15 0457  NA  --  138 139  K  --  4.0 4.0  CL  --  104 107  CO2  --  25 22  GLUCOSE  --  149* 121*  BUN  --  16 16  CREATININE  --  1.33* 1.27*  CALCIUM  --  8.9 8.7*  MG 1.7  --   --    Liver Function Tests  Recent Labs  04/04/15 1306  AST 31  ALT 14*  ALKPHOS 68  BILITOT 1.0  PROT 6.7  ALBUMIN 3.0*   No results for input(s): LIPASE, AMYLASE in the last 72 hours. Cardiac Enzymes  Recent Labs  04/04/15 1306 04/04/15 1734 04/05/15 0010  TROPONINI 4.02* 8.01* 9.51*   BNP Invalid input(s): POCBNP D-Dimer No results for input(s): DDIMER in the last 72 hours. Hemoglobin A1C  Recent Labs  04/04/15 1306  HGBA1C 5.6   Fasting Lipid Panel  Recent Labs  04/05/15 0010  CHOL 154  HDL 32*  LDLCALC 106*  TRIG 79  CHOLHDL 4.8   Thyroid Function Tests  Recent Labs  04/04/15 1306  TSH 1.579    Telemetry    NSR, HR in 70's - 90's. Occasional PVC's.  ECG    NSR, HR 98, Isolated TWI in V1. No acute changes when compared to previous tracings.   Cardiac Studies and Radiology    Dg Chest 2 View: 04/04/2015  CLINICAL DATA:  Midline chest pain starting this morning EXAM: CHEST  2  VIEW COMPARISON:  10/24/2012 FINDINGS: Cardiomediastinal silhouette is stable. Status post CABG. Mild elevation of the right hemidiaphragm again noted. No acute infiltrate or pleural effusion. No pulmonary edema. Bony thorax is stable. IMPRESSION: No active cardiopulmonary disease.  Status post CABG. Electronically Signed   By: Natasha Mead M.D.   On: 04/04/2015 11:12    Echocardiogram: 03/27/2014 Study Conclusions - Left ventricle: The cavity size was normal. Wall thickness was increased in a pattern of moderate LVH. The estimated ejection fraction was 60%. Doppler parameters are consistent with abnormal left ventricular relaxation (grade 1 diastolic dysfunction). - Left atrium: The atrium was moderately dilated. - Impressions: Technically limited study due to poor sound wave transmission.  Impressions: - Technically limited study due to poor sound wave transmission.  Assessment & Plan    1. Non-ST elevation myocardial infarction - Troponin peaked at 9.51.   - The patient was scheduled for cardiac catheterization on 04/05/2015 but he refused it due to "cardiac catheterizations not helping him previously". Overnight, he had continued chest pain and despite IV Morphine and PRN SL NTG. A NTG drip has been started. He is now requesting to have a cardiac catheterization. The risks and benefits of a catheterization were discussed with the patient and he agrees to proceed. He has been added on to the schedule for today. If he continues to have pain despite increases in his NTG drip, his case may need to be moved up sooner. - Continue ASA, Plavix, Heparin, NTG, Carvedilol and Statin.  2. Hypertensive Urgency - Blood pressure elevated overnight into the low-200's. Required IV Hydralazine. - continue Lisinopril  daily and Coreg 12.5mg  BID. Will need to further titrate his medications following cath. He is currently on a NTG drip which is helping with his elevated BP (most recent reading  150/89).  3. Medication Noncompliance - the importance of medication compliance has been reviewed with the patient.  4. Hyperlipidemia - continue Lipitor 80 mg daily.  5. Type 2 DM - SSI while admitted  Signed, Ellsworth Lennox , PA-C 7:37 AM 04/06/2015 Pager: [phone] As above; patient seen and examined; had recurrent CP; now pain free; He is now agreeable to cardiac catheterization. The risks and benefits were discussed including stroke, myocardial infarction and death. We'll proceed later today. Continue aspirin, Plavix, heparin, beta blocker and statin.Increase lisinopril to 10 mg daily for better blood pressure control.  Patient instructed on the importance of compliance with  medical therapy previously. Olga MillersBrian Crenshaw

## 2015-04-06 NOTE — Progress Notes (Addendum)
   04/06/15 2236  Vitals  BP 128/73 mmHg  MAP (mmHg) 90  Pulse Rate 92  ECG Heart Rate 92  Resp 15  Oxygen Therapy  SpO2 98 %  Right femoral sheath pulled.

## 2015-04-06 NOTE — Progress Notes (Addendum)
   04/06/15 2315  Vitals  BP (!) 141/73 mmHg  MAP (mmHg) 92  Pulse Rate 89  ECG Heart Rate 87  Resp 10  Oxygen Therapy  SpO2 93 %  Manual pressure held for 25 min. VSS. Right groin level 0 before and after sheath pull. Pt educated on holding pressure to right groin when coughing, sneezing, or bearing down for urination or BM. Pt educated to call nurse for signs of bleeding. Pt voiced understanding and demonstrated where to hold pressure. Pt tolerated procedure well. No complications noted. Will continue to monitor.

## 2015-04-06 NOTE — Progress Notes (Signed)
ANTICOAGULATION CONSULT NOTE - Follow Up Consult  Pharmacy Consult for Heparin Indication: NSTEMI  No Known Allergies  Patient Measurements: Height: 6\' 1"  (185.4 cm) Weight: 211 lb 10.3 oz (96 kg) IBW/kg (Calculated) : 79.9 Heparin Dosing Weight: 102 kg  Vital Signs: Temp: 97.8 F (36.6 C) (03/09 0800) Temp Source: Oral (03/09 0800) BP: 144/86 mmHg (03/09 1000) Pulse Rate: 78 (03/09 1000)  Labs:  Recent Labs  04/04/15 1040 04/04/15 1306 04/04/15 1734 04/05/15 0010 04/06/15 0457  HGB 13.9  --   --  12.5* 13.7  HCT 41.8  --   --  37.5* 40.9  PLT 173  --   --  155 172  LABPROT  --   --   --  15.0  --   INR  --   --   --  1.16  --   HEPARINUNFRC  --   --  0.34 0.34 0.31  CREATININE 1.16  --   --  1.33* 1.27*  TROPONINI  --  4.02* 8.01* 9.51*  --     Estimated Creatinine Clearance: 77.4 mL/min (by C-G formula based on Cr of 1.27).  Assessment: 60yo male with history of CAD presents with CP. Pharmacy is consulted to dose heparin for ACS/chest pain.   AC: NSTEMI. HL 0.31 remains in goal. CBC stable.  CV: HLD, CAD, HTN, Refused cath 3/8, now wants cath. + CP Meds: ASA81, Lipitor80, Coreg, Plavix, lisinopril, NTG infusion. VSS  Goal of Therapy:  Heparin level 0.3-0.7 units/ml Monitor platelets by anticoagulation protocol: Yes   Plan:  Continue IV heparin at 1400 units/hr Will f/u after cath    Oneika Simonian S. Merilynn Finlandobertson, PharmD, BCPS Clinical Staff Pharmacist Pager [phone]  Misty Stanleyobertson, Amillya Chavira Stillinger 04/06/2015,10:56 AM

## 2015-04-06 NOTE — Progress Notes (Signed)
  Echocardiogram 2D Echocardiogram has been performed.  Janalyn HarderWest, Ricard Faulkner R 04/06/2015, 12:45 PM

## 2015-04-07 ENCOUNTER — Encounter (HOSPITAL_COMMUNITY): Payer: Self-pay | Admitting: Cardiovascular Disease

## 2015-04-07 LAB — URINALYSIS, ROUTINE W REFLEX MICROSCOPIC
Bilirubin Urine: NEGATIVE
GLUCOSE, UA: NEGATIVE mg/dL
Ketones, ur: 40 mg/dL — AB
NITRITE: NEGATIVE
PH: 5 (ref 5.0–8.0)
Protein, ur: 30 mg/dL — AB
SPECIFIC GRAVITY, URINE: 1.044 — AB (ref 1.005–1.030)

## 2015-04-07 LAB — BASIC METABOLIC PANEL
Anion gap: 12 (ref 5–15)
BUN: 15 mg/dL (ref 6–20)
CALCIUM: 8.8 mg/dL — AB (ref 8.9–10.3)
CO2: 20 mmol/L — AB (ref 22–32)
CREATININE: 1.22 mg/dL (ref 0.61–1.24)
Chloride: 107 mmol/L (ref 101–111)
GFR calc Af Amer: >60 mL/min (ref >60)
GFR calc non Af Amer: >60 mL/min (ref >60)
GLUCOSE: 137 mg/dL — AB (ref 65–99)
Potassium: 4.3 mmol/L (ref 3.5–5.1)
SODIUM: 139 mmol/L (ref 135–145)

## 2015-04-07 LAB — CBC
HEMATOCRIT: 41.2 % (ref 39.0–52.0)
HEMOGLOBIN: 13.2 g/dL (ref 13.0–17.0)
MCH: 27.4 pg (ref 26.0–34.0)
MCHC: 32 g/dL (ref 30.0–36.0)
MCV: 85.7 fL (ref 78.0–100.0)
Platelets: 197 10*3/uL (ref 150–400)
RBC: 4.81 MIL/uL (ref 4.22–5.81)
RDW: 16.1 % — ABNORMAL HIGH (ref 11.5–15.5)
WBC: 9.3 10*3/uL (ref 4.0–10.5)

## 2015-04-07 LAB — GLUCOSE, CAPILLARY
GLUCOSE-CAPILLARY: 104 mg/dL — AB (ref 65–99)
GLUCOSE-CAPILLARY: 126 mg/dL — AB (ref 65–99)
GLUCOSE-CAPILLARY: 166 mg/dL — AB (ref 65–99)
Glucose-Capillary: 88 mg/dL (ref 65–99)

## 2015-04-07 LAB — URINE MICROSCOPIC-ADD ON

## 2015-04-07 MED ORDER — INFLUENZA VAC SPLIT QUAD 0.5 ML IM SUSY
0.5000 mL | PREFILLED_SYRINGE | INTRAMUSCULAR | Status: AC
  Administered 2015-04-08: 0.5 mL via INTRAMUSCULAR
  Filled 2015-04-07: qty 0.5

## 2015-04-07 MED ORDER — ISOSORBIDE MONONITRATE ER 30 MG PO TB24
30.0000 mg | ORAL_TABLET | Freq: Every day | ORAL | Status: DC
  Administered 2015-04-07 – 2015-04-08 (×2): 30 mg via ORAL
  Filled 2015-04-07 (×3): qty 1

## 2015-04-07 MED ORDER — CIPROFLOXACIN HCL 500 MG PO TABS
500.0000 mg | ORAL_TABLET | Freq: Two times a day (BID) | ORAL | Status: DC
  Administered 2015-04-07 – 2015-04-08 (×3): 500 mg via ORAL
  Filled 2015-04-07 (×4): qty 1

## 2015-04-07 MED ORDER — HEPARIN SODIUM (PORCINE) 5000 UNIT/ML IJ SOLN
5000.0000 [IU] | Freq: Three times a day (TID) | INTRAMUSCULAR | Status: DC
  Administered 2015-04-07 – 2015-04-08 (×3): 5000 [IU] via SUBCUTANEOUS
  Filled 2015-04-07 (×3): qty 1

## 2015-04-07 MED ORDER — CARVEDILOL 25 MG PO TABS
25.0000 mg | ORAL_TABLET | Freq: Two times a day (BID) | ORAL | Status: DC
  Administered 2015-04-07 – 2015-04-08 (×3): 25 mg via ORAL
  Filled 2015-04-07 (×4): qty 1

## 2015-04-07 MED FILL — Nitroglycerin IV Soln 100 MCG/ML in D5W: INTRA_ARTERIAL | Qty: 10 | Status: AC

## 2015-04-07 MED FILL — Bivalirudin For IV Soln 250 MG: INTRAVENOUS | Qty: 250 | Status: AC

## 2015-04-07 MED FILL — Sodium Chloride IV Soln 0.9%: INTRAVENOUS | Qty: 50 | Status: AC

## 2015-04-07 NOTE — Care Management Important Message (Signed)
Important Message  Patient Details  Name: Darrell [name] MRN: [id number] Date of Birth: 15-Aug-1955   Medicare Important Message Given:  Yes    [name] 04/07/2015, 12:49 PM

## 2015-04-07 NOTE — Progress Notes (Signed)
Report called to rn [id number]. Patient with no complaints at the current time. Will transfer with Rn via wc.

## 2015-04-07 NOTE — Progress Notes (Addendum)
Patient Profile:   Primary Cardiologist: [name]  60 yo male w/ PMH of CAD (s/p CABG w/ LIMA->LAD, VG->D1, LRA->OM1, VG->PDA->LPL with last cath in 2014 showing 2/5 patent grafts), Type 2 DM, HTN, and medication noncompliance admitted on 04/04/2015 for NSTEMI. S/P PCI of Left CX. Residual CAD to be treated medically.  Subjective   Denies CP or dyspnea.  Inpatient Medications    . aspirin  81 mg Oral Daily  . atorvastatin  80 mg Oral q1800  . carvedilol  12.5 mg Oral BID WC  . clopidogrel  75 mg Oral Q breakfast  . insulin aspart  0-5 Units Subcutaneous QHS  . insulin aspart  0-9 Units Subcutaneous TID WC  . lisinopril  5 mg Oral Daily  . sodium chloride flush  3 mL Intravenous Q12H    Vital Signs    Filed Vitals:   04/07/15 0400 04/07/15 0500 04/07/15 0600 04/07/15 0700  BP: 105/55 111/61 101/51 131/80  Pulse: 92 91 93 93  Temp: 98.5 F (36.9 C)     TempSrc: Oral     Resp: Height:      Weight:  213 lb 3 oz (96.7 kg)    SpO2: 95% 96% 96% 95%    Intake/Output Summary (Last 24 hours) at 04/07/15 0738 Last data filed at 04/07/15 0700  Gross per 24 hour  Intake 1361.01 ml  Output   1525 ml  Net -163.99 ml   Filed Weights   04/06/15 0315 04/06/15 1900 04/07/15 0500  Weight: 211 lb 10.3 oz (96 kg) 213 lb 13.5 oz (97 kg) 213 lb 3 oz (96.7 kg)    Physical Exam    General: Well developed, well nourished, NAD Head: Normal. Neck: Supple Lungs:  CTA Heart: RRR Abdomen: Soft, non-tender, non-distended. Right groin with no hematoma and no bruit Extremities: No edema.  Neuro: Alert and oriented X 3. Moves all extremities spontaneously.  Labs    CBC  Recent Labs  04/06/15 0457 04/07/15 0300  WBC 6.5 9.3  HGB 13.7 13.2  HCT 40.9 41.2  MCV 84.0 85.7  PLT 172 197   Basic Metabolic Panel  Recent Labs  04/04/15 1306  04/06/15 0457 04/07/15 0300  NA  --   < > 139 139  K  --   < > 4.0 4.3  CL  --   < > 107 107  CO2  --   < > 22 20*    GLUCOSE  --   < > 121* 137*  BUN  --   < > 16 15  CREATININE  --   < > 1.27* 1.22  CALCIUM  --   < > 8.7* 8.8*  MG 1.7  --   --   --   < > = values in this interval not displayed. Liver Function Tests  Recent Labs  04/04/15 1306  AST 31  ALT 14*  ALKPHOS 68  BILITOT 1.0  PROT 6.7  ALBUMIN 3.0*   Cardiac Enzymes  Recent Labs  04/04/15 1306 04/04/15 1734 04/05/15 0010  TROPONINI 4.02* 8.01* 9.51*   Hemoglobin A1C  Recent Labs  04/04/15 1306  HGBA1C 5.6   Fasting Lipid Panel  Recent Labs  04/05/15 0010  CHOL 154  HDL 32*  LDLCALC 106*  TRIG 79  CHOLHDL 4.8   Thyroid Function Tests  Recent Labs  04/04/15 1306  TSH 1.579    Telemetry    NSR    Cardiac  Studies and Radiology    Dg Chest 2 View: 04/04/2015  CLINICAL DATA:  Midline chest pain starting this morning EXAM: CHEST  2 VIEW COMPARISON:  10/24/2012 FINDINGS: Cardiomediastinal silhouette is stable. Status post CABG. Mild elevation of the right hemidiaphragm again noted. No acute infiltrate or pleural effusion. No pulmonary edema. Bony thorax is stable. IMPRESSION: No active cardiopulmonary disease.  Status post CABG. Electronically Signed   By: Natasha MeadLiviu  Pop M.D.   On: 04/04/2015 11:12    Echocardiogram: 03/27/2014 Study Conclusions - Left ventricle: The cavity size was normal. Wall thickness was increased in a pattern of moderate LVH. The estimated ejection fraction was 60%. Doppler parameters are consistent with abnormal left ventricular relaxation (grade 1 diastolic dysfunction). - Left atrium: The atrium was moderately dilated. - Impressions: Technically limited study due to poor sound wave transmission.  Impressions: - Technically limited study due to poor sound wave transmission.  Assessment & Plan    1. Non-ST elevation myocardial infarction - Troponin peaked at 9.51.   - now s/p PCI of left cx; residual CAD to be treated medically - Continue ASA, Plavix, Carvedilol (increase  to 25 mg BID) and Statin. DC IV NTG and add imdur 30 mg daily. - Watch renal function given recent dye load.  2. Hypertensive Urgency - Blood pressure improving; follow and adjust meds as needed.  3. Medication Noncompliance - the importance of medication compliance has been reviewed with the patient.  4. Hyperlipidemia - continue Lipitor 80 mg daily.  5. Type 2 DM - SSI while admitted  6. Possible UTI - Add short course of ciprofloxacin (500 BID for 3 days)  Transfer to telemetry and ambulate; DC in 24-48 hrs if stable.  Signed, Milus HeightBrian CrenshawMD Lac/Harbor-[facility]cla Medical CenterFACC

## 2015-04-07 NOTE — Progress Notes (Signed)
CARDIAC REHAB PHASE I   PRE:  Rate/Rhythm: 85 SR  BP:  Supine: 119/67  Sitting:   Standing:    SaO2:   MODE:  Ambulation: 360 ft   POST:  Rate/Rhythm: 100 SR  BP:  Supine:   Sitting: 139/79  Standing:    SaO2:  1420-1510 Pt walked 360 ft on RA with slow steady gait. Stopped once to rest. Denied CP. MI education completed with pt who voiced understanding. Stressed importance of plavix with stent. Reviewed NTG use, diet, ex ed, and risk factors. Stressed to pt importance of taking his meds and not running out. Encouraged to call MD office if he has any difficulty with getting prescription as his BP was extremely high on admission and he had run out of meds. Stated he did not have transportation to get to MD office in past but now he has Medicaid that will help with transportation. Discussed CRP 2 and referring to [place].   Luetta Nuttingharlene Verline Kong, RN BSN  04/07/2015 3:05 PM

## 2015-04-08 ENCOUNTER — Encounter (HOSPITAL_COMMUNITY): Payer: Self-pay | Admitting: Physician Assistant

## 2015-04-08 DIAGNOSIS — E0801 Diabetes mellitus due to underlying condition with hyperosmolarity with coma: Secondary | ICD-10-CM

## 2015-04-08 DIAGNOSIS — I160 Hypertensive urgency: Secondary | ICD-10-CM

## 2015-04-08 DIAGNOSIS — Z794 Long term (current) use of insulin: Secondary | ICD-10-CM

## 2015-04-08 DIAGNOSIS — Z9119 Patient's noncompliance with other medical treatment and regimen: Secondary | ICD-10-CM

## 2015-04-08 DIAGNOSIS — I214 Non-ST elevation (NSTEMI) myocardial infarction: Secondary | ICD-10-CM | POA: Diagnosis not present

## 2015-04-08 DIAGNOSIS — I25110 Atherosclerotic heart disease of native coronary artery with unstable angina pectoris: Secondary | ICD-10-CM

## 2015-04-08 DIAGNOSIS — E7800 Pure hypercholesterolemia, unspecified: Secondary | ICD-10-CM

## 2015-04-08 LAB — CBC
HCT: 36.4 % — ABNORMAL LOW (ref 39.0–52.0)
HEMOGLOBIN: 12.5 g/dL — AB (ref 13.0–17.0)
MCH: 28.8 pg (ref 26.0–34.0)
MCHC: 34.3 g/dL (ref 30.0–36.0)
MCV: 83.9 fL (ref 78.0–100.0)
Platelets: 170 10*3/uL (ref 150–400)
RBC: 4.34 MIL/uL (ref 4.22–5.81)
RDW: 16.1 % — ABNORMAL HIGH (ref 11.5–15.5)
WBC: 7.2 10*3/uL (ref 4.0–10.5)

## 2015-04-08 LAB — GLUCOSE, CAPILLARY
Glucose-Capillary: 110 mg/dL — ABNORMAL HIGH (ref 65–99)
Glucose-Capillary: 134 mg/dL — ABNORMAL HIGH (ref 65–99)

## 2015-04-08 LAB — BASIC METABOLIC PANEL
Anion gap: 9 (ref 5–15)
BUN: 28 mg/dL — AB (ref 6–20)
CHLORIDE: 107 mmol/L (ref 101–111)
CO2: 20 mmol/L — ABNORMAL LOW (ref 22–32)
CREATININE: 1.41 mg/dL — AB (ref 0.61–1.24)
Calcium: 8.7 mg/dL — ABNORMAL LOW (ref 8.9–10.3)
GFR calc Af Amer: >60 mL/min (ref >60)
GFR calc non Af Amer: 53 mL/min — ABNORMAL LOW (ref >60)
GLUCOSE: 102 mg/dL — AB (ref 65–99)
POTASSIUM: 4.4 mmol/L (ref 3.5–5.1)
Sodium: 136 mmol/L (ref 135–145)

## 2015-04-08 MED ORDER — NITROGLYCERIN 0.4 MG SL SUBL: 0.4000 mg | SUBLINGUAL_TABLET | SUBLINGUAL | Status: DC | PRN

## 2015-04-08 MED ORDER — AMLODIPINE BESYLATE 5 MG PO TABS: 5.0000 mg | ORAL_TABLET | Freq: Every day | ORAL | Status: DC

## 2015-04-08 MED ORDER — CLOPIDOGREL BISULFATE 75 MG PO TABS: 75.0000 mg | ORAL_TABLET | Freq: Every day | ORAL | Status: DC

## 2015-04-08 MED ORDER — ATORVASTATIN CALCIUM 80 MG PO TABS: 80.0000 mg | ORAL_TABLET | Freq: Every day | ORAL | Status: DC

## 2015-04-08 MED ORDER — CIPROFLOXACIN HCL 500 MG PO TABS: 500.0000 mg | ORAL_TABLET | Freq: Two times a day (BID) | ORAL | Status: DC

## 2015-04-08 MED ORDER — AMLODIPINE BESYLATE 5 MG PO TABS
5.0000 mg | ORAL_TABLET | Freq: Every day | ORAL | Status: DC
  Administered 2015-04-08: 5 mg via ORAL
  Filled 2015-04-08: qty 1

## 2015-04-08 MED ORDER — CARVEDILOL 25 MG PO TABS: 25.0000 mg | ORAL_TABLET | Freq: Two times a day (BID) | ORAL | Status: DC

## 2015-04-08 MED ORDER — ISOSORBIDE MONONITRATE ER 30 MG PO TB24: 30.0000 mg | ORAL_TABLET | Freq: Every day | ORAL | Status: DC

## 2015-04-08 NOTE — Progress Notes (Signed)
Pt anticipates d/c later today.  Pt received dietary instructions on yesterday.  Pt wife is present today and has questions regarding heart healthy diet.  Reviewed with pt and his wife heart healthy nutrition. Handout provided, questions answered with verbalization of understanding. Alanson Alyarlette Cissy Galbreath RN, BSN [phone]

## 2015-04-08 NOTE — Discharge Summary (Signed)
Discharge Summary    Patient ID: Darrell [name],  MRN: [id number], DOB/AGE: 59-29-1957 60 y.o.  Admit date: 04/04/2015 Discharge date: 04/08/2015  Primary Care Provider: Minerva Ends Primary Cardiologist: [facility]  Discharge Diagnoses    Principal Problem:   NSTEMI (non-ST elevated myocardial infarction) [facility]) Active Problems:   Diabetes mellitus ([place])   HYPERCHOLESTEROLEMIA   Coronary atherosclerosis, CABG 2009 and cathj 2014 with graft dysfunction   Hypertensive urgency   Noncompliance   HTN (hypertension)   Allergies No Known Allergies  Diagnostic Studies/Procedures    Cardiac cath, 04/06/2015  Ost LAD to Mid LAD lesion, 100% stenosed. The lesion was previously treated with a stent (unknown type).  Mid RCA lesion, 100% stenosed. The lesion was previously treated with a stent (unknown type).  Dist Cx-1 lesion, 95% stenosed. The lesion was previously treated with a stent (unknown type).  Dist Cx-2 lesion, 95% stenosed.  Origin to Prox Graft lesion, 100% stenosed.  Dist LAD lesion, 95% stenosed.  Ost Cx to Mid Cx lesion, 95% stenosed. Post intervention with 2.5 mm x 12 mm long Synergy followed in sequence by 2.5 mm x 16 mm long Synergy in the proximal native circumflex for in-stent restenosis, there is a 0% residual stenosis. The lesion was previously treated with a stent (unknown type).  There is mild left ventricular systolic dysfunction. IMPRESSION:Mr. Lachapelle has a patient radial graft to an obtuse marginal branch as a patent LIMA to the mid LAD involving the distal LAD is diffusely diseased throughout its entirety. There are left-to-right collaterals. The native right is occluded as is the graft to the PDA and PLA. He does have high grade "in-stent restenosis" within the proximal codominant circumflex which may be his culprit lesion although he has distal disease as well. I was able to stent the proximal circumflex however this was a technically challenging  the long procedure using 240 mL of contrast. I elected not to intervene on the distal circumflex at this time but rather treat him medically to assess efficacy of revascularization. The patient remained hemodynamically and electrically stable throughout the procedure. The guidewire and catheter were removed. Sheath was secured. The patient was on IV nitroglycerin and his Angiomax will be continued for 4 additional hours. The sheath will be removed after and pressure held. The patient will be medically for his residual CAD.  2-D echocardiogram, 04/06/2015 - Procedure narrative: Transthoracic echocardiography. Image  quality was suboptimal. The study was technically difficult, as a  result of poor sound wave transmission and body habitus.  Intravenous contrast (Definity) was administered. - Left ventricle: The cavity size was normal. There was severe  concentric hypertrophy. Systolic function was normal. The  estimated ejection fraction was in the range of 50% to 55%. Mild  hypokinesis of the inferior myocardium. Doppler parameters are  consistent with abnormal left ventricular relaxation (grade 1  diastolic dysfunction). - Aortic valve: Transvalvular velocity was within the normal range.  There was no stenosis. There was no regurgitation. - Mitral valve: There was no regurgitation. - Left atrium: The atrium was severely dilated. - Right ventricle: The cavity size was normal. Wall thickness was  normal. - Right atrium: The atrium was mildly dilated. - Tricuspid valve: There was no regurgitation. - Inferior vena cava: The vessel was normal in size.  _____________   History of Present Illness     60 year old male with hx of CAD s/p CABG (LIMA to LAD, RA to OM, SVG to PDA/LP 100, SVG  to D 100, diabetes, hypertension, medication noncompliance Came to the hospital with chest pain and was admitted for further evaluation and treatment.  Hospital Course     Consultants: None   His  cardiac enzymes elevated indicating a non-STEMI. He was pain-free on aspirin, heparin, nitrates and morphine. He was taken to the cath lab on 04/06/2015 and had 2 drug-eluting stents to the circumflex. There was distal disease in the circumflex that had also been stented previously, but because of the size of the vessels, tortuosity and dye load, this was not intervened upon.  He was seen by cardiac rehabilitation and educated on MI restrictions, heart-healthy lifestyle modifications and exercise guidelines. It is hoped he will do outpatient cardiac rehabilitation as well.  He was on aspirin, high-dose statin, a beta blocker, and nitrates. He was started on an ACE inhibitor, but it was discontinued due to elevated creatinine.  His blood sugars were managed with sliding scale insulin while he was here. He will restart his metformin 48 hours after cath.  His blood pressure improved once his medications were restarted and uptitrated. Stopped lisinopril due to renal insuff and started amlodipine.  He was started on Cipro for a possible UTI. He will complete that as an outpatient.  On 04/08/2015, he was seen by Dr. Radford Pax and all data were reviewed. He is to get a fasting lipid profile and liver function testing in 6 weeks. He needs a TCM appointment in 7-10 days. No further inpatient workup is indicated and he is considered stable for discharge, to follow-up as an outpatient.   _____________  Discharge Vitals Blood pressure 117/65, pulse 67, temperature 99 F (37.2 C), temperature source Oral, resp. rate 18, height '6\' 1"'$  (1.854 m), weight 216 lb 12.8 oz (98.34 kg), SpO2 99 %.  Filed Weights   04/07/15 0500 04/07/15 1354 04/08/15 0630  Weight: 213 lb 3 oz (96.7 kg) 215 lb 3.2 oz (97.614 kg) 216 lb 12.8 oz (98.34 kg)    Labs & Radiologic Studies     CBC  Recent Labs  04/07/15 0300 04/08/15 0440  WBC 9.3 7.2  HGB 13.2 12.5*  HCT 41.2 36.4*  MCV 85.7 83.9  PLT 197 789   Basic Metabolic  Panel  Recent Labs  04/07/15 0300 04/08/15 0440  NA 139 136  K 4.3 4.4  CL 107 107  CO2 20* 20*  GLUCOSE 137* 102*  BUN 15 28*  CREATININE 1.22 1.41*  CALCIUM 8.8* 8.7*   Lab Results  Component Value Date   ALT 14* 04/04/2015   AST 31 04/04/2015   ALKPHOS 68 04/04/2015   BILITOT 1.0 04/04/2015   Lab Results  Component Value Date   CHOL 154 04/05/2015   HDL 32* 04/05/2015   LDLCALC 106* 04/05/2015   TRIG 79 04/05/2015   CHOLHDL 4.8 04/05/2015   Lab Results  Component Value Date   TROPONINI  9.51* 04/05/2015   Lab Results  Component Value Date   HGBA1C 5.6 04/04/2015    Dg Chest 2 View  04/04/2015  CLINICAL DATA:  Midline chest pain starting this morning EXAM: CHEST  2 VIEW COMPARISON:  10/24/2012 FINDINGS: Cardiomediastinal silhouette is stable. Status post CABG. Mild elevation of the right hemidiaphragm again noted. No acute infiltrate or pleural effusion. No pulmonary edema. Bony thorax is stable. IMPRESSION: No active cardiopulmonary disease.  Status post CABG. Electronically Signed   By: Lahoma Crocker M.D.   On: 04/04/2015 11:12    Disposition   Pt is being discharged  home today in good condition.  Follow-up Plans & Appointments    Follow-up Information    Follow up with Lujean Amel, MD On 04/11/2015.   Specialty:  Family Medicine   Why:  3:30 for hospital follow up and new patient, please bring id ,insurance card and list of medications   Contact information:   [address] [place] [address] [phone]       Follow up with [facility].   Specialty:  Cardiology   Why:  The office will call.   Contact information:   [address] [place] [phone]     Discharge Instructions    AMB Referral to Cardiac Rehabilitation - Phase II    Complete by:  As directed   Diagnosis:  Myocardial Infarction     Amb Referral to Cardiac Rehabilitation    Complete by:  As directed    Diagnosis:   Myocardial Infarction PCI       Diet - low sodium heart healthy    Complete by:  As directed      Diet Carb Modified    Complete by:  As directed      Increase activity slowly    Complete by:  As directed            Discharge Medications   Current Discharge Medication List    START taking these medications   Details  amLODipine (NORVASC) 5 MG tablet Take 1 tablet (5 mg total) by mouth daily. Qty: 30 tablet, Refills: 6    ciprofloxacin (CIPRO) 500 MG tablet Take 1 tablet (500 mg total) by mouth 2 (two) times daily. Qty: 4 tablet, Refills: 0    isosorbide mononitrate (IMDUR) 30 MG 24 hr tablet Take 1 tablet (30 mg total) by mouth daily. Qty: 30 tablet, Refills: 6      CONTINUE these medications which have CHANGED   Details  atorvastatin (LIPITOR) 80 MG tablet Take 1 tablet (80 mg total) by mouth daily. Qty: 30 tablet, Refills: 6    carvedilol (COREG) 25 MG tablet Take 1 tablet (25 mg total) by mouth 2 (two) times daily with a meal. Qty: 60 tablet, Refills: 6    clopidogrel (PLAVIX) 75 MG tablet Take 1 tablet (75 mg total) by mouth daily. Qty: 30 tablet, Refills: 11    nitroGLYCERIN (NITROSTAT) 0.4 MG SL tablet Place 1 tablet (0.4 mg total) under the tongue every 5 (five) minutes as needed for chest pain. Qty: 25 tablet, Refills: 3      CONTINUE these medications which have NOT CHANGED   Details  aspirin EC 81 MG EC tablet Take 1 tablet (81 mg total) by mouth daily.    glucose monitoring kit (FREESTYLE) monitoring kit 1 each by Does not apply route once. Glucometer with test strips and lancets to check blood glucose BID ac breakfast and dinner. Qty: 1 each, Refills: 0    metFORMIN (GLUCOPHAGE) 500 MG tablet Take 1 tablet (500 mg total) by mouth 2 (two) times daily with a meal. Qty: 60 tablet, Refills: 1      STOP taking these medications     isosorbide-hydrALAZINE (BIDIL) 20-37.5 MG per tablet      lisinopril (PRINIVIL,ZESTRIL) 20 MG tablet           Aspirin prescribed at discharge?  Yes High Intensity Statin Prescribed? (Lipitor 40-'80mg'$  or Crestor 20-'40mg'$ ): Yes Beta Blocker Prescribed? Yes For EF 45% or less, Was ACEI/[name] Prescribed? No: Elevated creatinine ADP  Receptor Inhibitor Prescribed? (i.e. Plavix etc.-Includes Medically Managed Patients): Yes For EF <40%, Aldosterone Inhibitor Prescribed? No: Not applicable Was EF assessed during THIS hospitalization? Yes Was Cardiac Rehab II ordered? (Included Medically managed Patients): Yes   Outstanding Labs/Studies   none  Duration of Discharge Encounter   Greater than 30 minutes including physician time.  Jonetta Speak NP 04/08/2015, 3:29 PM  Agree with discharge summary as outlined by Rosaria Ferries, PA with minor changes.

## 2015-04-08 NOTE — Progress Notes (Signed)
CM spoke with pt concerning medication.  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  CM discussed with pt his probable need of multiple medications for the rest of his life as he is diabetic and encouraged him to contact a commercial provider and secure a medication plan with his Medicare A and B. No other CM needs were communicated.

## 2015-04-08 NOTE — Discharge Instructions (Signed)
PLEASE REMEMBER TO BRING ALL OF YOUR MEDICATIONS TO EACH OF YOUR FOLLOW-UP OFFICE VISITS.  PLEASE ATTEND ALL SCHEDULED FOLLOW-UP APPOINTMENTS.   Activity: Increase activity slowly as tolerated. You may shower, but no soaking baths (or swimming) for 1 week. No driving for 1 week. No lifting over 5 lbs for 2 weeks. No sexual activity for 1 week.   You May Return to Work: in 3 weeks (if applicable)  Wound Care: You may wash cath site gently with soap and water. Keep cath site clean and dry. If you notice pain, swelling, bleeding or pus at your cath site, please call [phone].    Cardiac Cath Site Care Refer to this sheet in the next few weeks. These instructions provide you with information on caring for yourself after your procedure. Your caregiver may also give you more specific instructions. Your treatment has been planned according to current medical practices, but problems sometimes occur. Call your caregiver if you have any problems or questions after your procedure. HOME CARE INSTRUCTIONS  You may shower 24 hours after the procedure. Remove the bandage (dressing) and gently wash the site with plain soap and water. Gently pat the site dry.   Do not apply powder or lotion to the site.   Do not sit in a bathtub, swimming pool, or whirlpool for 5 to 7 days.   No bending, squatting, or lifting anything over 10 pounds (4.5 kg) as directed by your caregiver.   Inspect the site at least twice daily.   Do not drive home if you are discharged the same day of the procedure. Have someone else drive you.   You may drive 24 hours after the procedure unless otherwise instructed by your caregiver.  What to expect:  Any bruising will usually fade within 1 to 2 weeks.   Blood that collects in the tissue (hematoma) may be painful to the touch. It should usually decrease in size and tenderness within 1 to 2 weeks.  SEEK IMMEDIATE MEDICAL CARE IF:  You have unusual pain at the site or down the  affected limb.   You have redness, warmth, swelling, or pain at the site.   You have drainage (other than a small amount of blood on the dressing).   You have chills.   You have a fever or persistent symptoms for more than 72 hours.   You have a fever and your symptoms suddenly get worse.   Your leg becomes pale, cool, tingly, or numb.   You have heavy bleeding from the site. Hold pressure on the site.  Document Released: 02/16/2010 Document Revised: 01/03/2011 Document Reviewed:    Acute Coronary Syndrome Acute coronary syndrome (ACS) is a serious problem in which there is suddenly not enough blood and oxygen supplied to the heart. ACS may mean that one or more of the blood vessels in your heart (coronary arteries) may be blocked. ACS can result in chest pain or a heart attack (myocardial infarction or MI). CAUSES This condition is caused by atherosclerosis, which is the buildup of fat and cholesterol (plaque) on the inside of the arteries. Over time, the plaque may narrow or block the artery, and this will lessen blood flow to the heart. Plaque can also become weak and break off within a coronary artery to form a clot and cause a sudden blockage. RISK FACTORS The risks factors of this condition include:  High cholesterol levels.  High blood pressure (hypertension).  Smoking.  Diabetes.  Age.  Family history of  chest pain, heart disease, or stroke.  Lack of exercise. SYMPTOMS The most common signs of this condition include:  Chest pain, which can be:  A crushing or squeezing in the chest.  A tightness, pressure, fullness, or heaviness in the chest.  Present for more than a few minutes, or it can stop and recur.  Pain in the arms, neck, jaw, or back.  Unexplained heartburn or indigestion.  Shortness of breath.  Nausea.  Sudden cold sweats.  Feeling light-headed or dizzy. Sometimes, this condition has no symptoms. DIAGNOSIS ACS may be diagnosed through the  following tests:  Electrocardiogram (ECG).  Blood tests.  Coronary angiogram. This is a procedure to look at the coronary arteries to see if there is any blockage. TREATMENT Treatment for ACS may include:  Healthy behavioral changes to reduce or control risk factors.  Medicine.  Coronary stenting.A stent helps to keep an artery open.  Coronary angioplasty. This procedure widens a narrowed or blocked artery.  Coronary artery bypass surgery. This will allow your blood to pass the blockage (bypass) to reach your heart. HOME CARE INSTRUCTIONS Eating and Drinking  Follow a heart-healthy diet. A dietitian can you help to educate you about healthy food options and changes.  Use healthy cooking methods such as roasting, grilling, broiling, baking, poaching, steaming, or stir-frying. Talk to a dietitian to learn more about healthy cooking methods. Medicines  Take medicines only as directed by your health care provider.  Do not take the following medicines unless your health care provider approves:  Nonsteroidal anti-inflammatory drugs (NSAIDs), such as ibuprofen, naproxen, or celecoxib.  Vitamin supplements that contain vitamin A, vitamin E, or both.  Hormone replacement therapy that contains estrogen with or without progestin.  Stop illegal drug use. Activities  Follow an exercise program that is approved by your health care provider.  Plan rest periods when you are fatigued. Lifestyle  Do not use any tobacco products, including cigarettes, chewing tobacco, or electronic cigarettes. If you need help quitting, ask your health care provider.  If you drink alcohol, and your health care provider approves, limit your alcohol intake to no more than 1 drink per day. One drink equals 12 ounces of beer, 5 ounces of wine, or 1 ounces of hard liquor.  Learn to manage stress.  Maintain a healthy weight. Lose weight as approved by your health care provider. General  Instructions  Manage other health conditions, such as hypertension and diabetes, as directed by your health care provider.  Keep all follow-up visits as directed by your health care provider. This is important.  Your health care provider may ask you to monitor your blood pressure. A blood pressure reading consists of a higher number over a lower number, such as 110 over 72, written as 110/72. Ideally, your blood pressure should be:  Below 140/90 if you have no other medical conditions.  Below 130/80 if you have diabetes or kidney disease. SEEK IMMEDIATE MEDICAL CARE IF:  You have pain in your chest, neck, arm, jaw, stomach, or back that lasts more than a few minutes, is recurring, or is not relieved by taking medicine under your tongue (sublingual nitroglycerin).  You have profuse sweating without cause.  You have unexplained:  Heartburn or indigestion.  Shortness of breath or difficulty breathing.  Nausea or vomiting.  Fatigue.  Feelings of nervousness or anxiety.  Weakness.  Diarrhea.  You have sudden light-headedness or dizziness.  You faint. These symptoms may represent a serious problem that is an emergency. Do  not wait to see if the symptoms will go away. Get medical help right away. Call your local emergency services (911 in the [place] Do not drive yourself to the clinic or hospital.   This information is not intended to replace advice given to you by your health care provider. Make sure you discuss any questions you have with your health care provider.   Document Released: 01/14/2005 Document Revised: 02/04/2014 Document Reviewed: 05/18/2013 Elsevier Interactive Patient Education 2016 Elsevier Inc.  Heart Attack A heart attack (myocardial infarction, MI) causes damage to your heart that cannot be fixed. A heart attack can happen when a heart (coronary) artery becomes blocked or narrowed. This cuts off the blood supply and oxygen to your heart. When one or more of  your coronary arteries becomes blocked, that area of your heart begins to die. This causes the pain you feel during a heart attack. Heart attack pain can also occur in one part of the body but be felt in another part of the body (referred pain). You may feel referred heart attack pain in your left arm, neck, or jaw. Pain may even be felt in the right arm. CAUSES  Many conditions can cause a heart attack. These include:   Atherosclerosis. This is when a fatty substance (plaque) gradually builds up in the arteries. This buildup can block or reduce the blood supply to one or more of the heart arteries.  A blood clot. A blood clot can develop suddenly when plaque breaks up (ruptures) within a heart artery. A blood clot can block the heart artery, which prevents blood flow to the heart.   Severe tightening (spasm) of the heart artery. This cuts off blood flow through the artery.  RISK FACTORS People at risk for heart attack usually have one or more of the following risk factors:   High blood pressure (hypertension).  High cholesterol.  Smoking.  Being male.  Being overweight or obese.  Older aged.   A family history of heart disease.  Lack of exercise.  Diabetes.  Stress.  Drinking too much alcohol.  Using illegal street drugs, such as cocaine and methamphetamines. SYMPTOMS  Heart attack symptoms can vary from person to person. Symptoms depend on factors like gender and age.   In both men and women, heart attack symptoms can include the following:   Chest pain. This may feel like crushing, squeezing, or a feeling of pressure.  Shortness of breath.  Heartburn or indigestion with or without vomiting, shortness of breath, or sweating.  Sudden cold sweats.  Sudden light-headedness.  Upper back pain.   Women can have unique heart attack symptoms, such as:   Unexplained feelings of nervousness or anxiety.  Discomfort between the shoulder blades or upper  back.  Tingling in the hands and arms.   Older people (of both genders) can have subtle heart attack symptoms, such as:   Sweating.  Shortness of breath.  General tiredness or not feeling well.  DIAGNOSIS  Diagnosing a heart attack involves several tests. They include:   An assessment of your vital signs. This includes checking your:  Heart rhythm.  Blood pressure.  Breathing rate.  Oxygen level.   An ECG (electrocardiogram) to measure the electrical activity of your heart.  Blood tests called cardiac markers. In these tests, blood is drawn at scheduled times to check for the specific proteins or enzymes released by damaged heart muscle.  A chest X-ray.  An echocardiogram to evaluate heart motion and blood flow.  Coronary angiography to look at the heart arteries.  TREATMENT  Treatment for a heart attack may include:   Medicine that breaks up or dissolves blood clots in the heart artery.  Angioplasty.  Cardiac stent placement.  Intra-aortic balloon pump therapy (IABP).  Open heart surgery (coronary artery bypass graft, CABG). HOME CARE INSTRUCTIONS  Take medicines only as directed by your health care provider. You may need to take medicine after a heart attack to:   Keep your blood from clotting too easily.  Control your blood pressure.  Lower your cholesterol.  Control abnormal heart rhythms.   Do not take the following medicines unless your health care provider approves:  Nonsteroidal anti-inflammatory drugs (NSAIDs), such as ibuprofen, naproxen, or celecoxib.  Vitamin supplements that contain vitamin A, vitamin E, or both.  Hormone replacement therapy that contains estrogen with or without progestin.  Make lifestyle changes as directed by your health care provider. These may include:   Quitting smoking, if you smoke.  Getting regular exercise. Ask your health care provider to suggest some activities that are safe for you.  Eating a  heart-healthy diet. A registered dietitian can help you learn healthy eating options.  Maintaining a healthy weight.   Managing diabetes, if necessary.  Reducing stress.  Limiting how much alcohol you drink. SEEK IMMEDIATE MEDICAL CARE IF:   You have sudden, unexplained chest discomfort.  You have sudden, unexplained discomfort in your arms, back, neck, or jaw.  You have shortness of breath at any time.  You suddenly start to sweat or your skin gets clammy.  You feel nauseous or vomit.  You suddenly feel light-headed or dizzy.  Your heart begins to beat fast or feels like it is skipping beats. These symptoms may represent a serious problem that is an emergency. Do not wait to see if the symptoms will go away. Get medical help right away. Call your local emergency services (911 in the [place] Do not drive yourself to the hospital.   This information is not intended to replace advice given to you by your health care provider. Make sure you discuss any questions you have with your health care provider.   Document Released: 01/14/2005 Document Revised: 02/04/2014 Document Reviewed: 03/19/2013 Elsevier Interactive Patient Education [employer]2016 [employer] Inc[employer]

## 2015-04-08 NOTE — Progress Notes (Addendum)
SUBJECTIVE:  No complaints  OBJECTIVE:   Vitals:   Filed Vitals:   04/07/15 1300 04/07/15 1354 04/07/15 2023 04/08/15 0630  BP: 125/84 138/81 135/68 155/90  Pulse: 81 88 84 79  Temp:  98.3 F (36.8 C) 98.9 F (37.2 C) 98.8 F (37.1 C)  TempSrc:  Oral Oral Oral  Resp: 17 16 16 16   Height:      Weight:  215 lb 3.2 oz (97.614 kg)  216 lb 12.8 oz (98.34 kg)  SpO2: 98% 99% 96% 99%   I&O's:   Intake/Output Summary (Last 24 hours) at 04/08/15 1044 Last data filed at 04/08/15 0600  Gross per 24 hour  Intake    600 ml  Output    975 ml  Net   -375 ml   TELEMETRY: Reviewed telemetry pt in NSR:     PHYSICAL EXAM General: Well developed, well nourished, in no acute distress Head: Eyes PERRLA, No xanthomas.   Normal cephalic and atramatic  Lungs:   Clear bilaterally to auscultation and percussion. Heart:   HRRR S1 S2 Pulses are 2+ & equal. Abdomen: Bowel sounds are positive, abdomen soft and non-tender without masses  Extremities:   No clubbing, cyanosis or edema.  DP +1 Neuro: Alert and oriented X 3. Psych:  Good affect, responds appropriately   LABS: Basic Metabolic Panel:  Recent Labs  45/40/9801/11/13 0300 04/08/15 0440  NA 139 136  K 4.3 4.4  CL 107 107  CO2 20* 20*  GLUCOSE 137* 102*  BUN 15 28*  CREATININE 1.22 1.41*  CALCIUM 8.8* 8.7*   Liver Function Tests: No results for input(s): AST, ALT, ALKPHOS, BILITOT, PROT, ALBUMIN in the last 72 hours. No results for input(s): LIPASE, AMYLASE in the last 72 hours. CBC:  Recent Labs  04/07/15 0300 04/08/15 0440  WBC 9.3 7.2  HGB 13.2 12.5*  HCT 41.2 36.4*  MCV 85.7 83.9  PLT 197 170   Cardiac Enzymes: No results for input(s): CKTOTAL, CKMB, CKMBINDEX, TROPONINI in the last 72 hours. BNP: Invalid input(s): POCBNP D-Dimer: No results for input(s): DDIMER in the last 72 hours. Hemoglobin A1C: No results for input(s): HGBA1C in the last 72 hours. Fasting Lipid Panel: No results for input(s): CHOL, HDL,  LDLCALC, TRIG, CHOLHDL, LDLDIRECT in the last 72 hours. Thyroid Function Tests: No results for input(s): TSH, T4TOTAL, T3FREE, THYROIDAB in the last 72 hours.  Invalid input(s): FREET3 Anemia Panel: No results for input(s): VITAMINB12, FOLATE, FERRITIN, TIBC, IRON, RETICCTPCT in the last 72 hours. Coag Panel:   Lab Results  Component Value Date   INR 1.16 04/05/2015   INR 1.3 12/09/2007   INR 1.0 12/01/2007    RADIOLOGY: Dg Chest 2 View  04/04/2015  CLINICAL DATA:  Midline chest pain starting this morning EXAM: CHEST  2 VIEW COMPARISON:  10/24/2012 FINDINGS: Cardiomediastinal silhouette is stable. Status post CABG. Mild elevation of the right hemidiaphragm again noted. No acute infiltrate or pleural effusion. No pulmonary edema. Bony thorax is stable. IMPRESSION: No active cardiopulmonary disease.  Status post CABG. Electronically Signed   By: Natasha MeadLiviu  Pop M.D.   On: 04/04/2015 11:12    Assessment & Plan   1. Non-ST elevation myocardial infarction - Troponin peaked at 9.51.  - now s/p PCI of left cx; residual CAD to be treated medically - Continue ASA, Plavix, Carvedilol (increase to 25 mg BID) and Statin, Imdur 30 mg daily. - Watch renal function given recent dye load.  Creatinine bumped slightly to 1.4 (admit was  1.16).  Will stop ACE I for now and reevaluate with BMET next Monday.  For now switch to amlodipine  daily for BP control.  - EF 50-55% by echo   2. Hypertensive Urgency - Blood pressure improved and fairly controlled on current regimen. Continue BB.  Hold ACE I since Creatinine bumped until seen back in office.  Will start amlodipine  daily instead for BP control.    3. Medication Noncompliance - the importance of medication compliance has been reviewed with the patient.  4. Hyperlipidemia - continue Lipitor 80 mg daily.  This was increased on admission.  Will need repeat FLP and ALT in 6 weeks.   5. Type 2 DM - SSI while admitted - restart Metformin 48  hours after cath  6. Possible UTI - Add short course of ciprofloxacin (500 BID for 3 days)     Patient is stable from cardiac standpoint for discharge home today with early TOC followup in 7-10 days.  Restart Metformin tomorrow.  Stop ACE I and start amlodipine  daily.  FLP and ALT in 6 weeks.  Cipro x 2 more days,    Quintella Reichert, MD  04/08/2015  10:44 AM

## 2015-04-11 ENCOUNTER — Telehealth: Payer: Self-pay | Admitting: Cardiology

## 2015-04-11 NOTE — Telephone Encounter (Signed)
TCM phone call .Marland Kitchen. Appt is on 04/21/15 at 9:30w/ [facility]   Thanks

## 2015-04-12 NOTE — Telephone Encounter (Signed)
Unable to leave a message, phone disconnected

## 2015-04-13 NOTE — Telephone Encounter (Signed)
Attempted to contact patient - no answer - message says "the person you are trying to reach is not accepting calls at this time"  Two TCM outreach attempts made. Encounter closed.

## 2015-04-15 ENCOUNTER — Inpatient Hospital Stay (HOSPITAL_COMMUNITY)
Admission: EM | Admit: 2015-04-15 | Discharge: 2015-04-17 | DRG: 303 | Disposition: A | Discharge status: Home / Self Care | Payer: Medicare Other | Attending: Internal Medicine | Admitting: Internal Medicine

## 2015-04-15 ENCOUNTER — Encounter (HOSPITAL_COMMUNITY): Payer: Self-pay | Admitting: Emergency Medicine

## 2015-04-15 DIAGNOSIS — R778 Other specified abnormalities of plasma proteins: Secondary | ICD-10-CM | POA: Diagnosis present

## 2015-04-15 DIAGNOSIS — I25110 Atherosclerotic heart disease of native coronary artery with unstable angina pectoris: Principal | ICD-10-CM | POA: Diagnosis present

## 2015-04-15 DIAGNOSIS — I200 Unstable angina: Secondary | ICD-10-CM | POA: Diagnosis present

## 2015-04-15 DIAGNOSIS — Z9119 Patient's noncompliance with other medical treatment and regimen: Secondary | ICD-10-CM

## 2015-04-15 DIAGNOSIS — Z9861 Coronary angioplasty status: Secondary | ICD-10-CM | POA: Diagnosis not present

## 2015-04-15 DIAGNOSIS — I10 Essential (primary) hypertension: Secondary | ICD-10-CM | POA: Diagnosis present

## 2015-04-15 DIAGNOSIS — E119 Type 2 diabetes mellitus without complications: Secondary | ICD-10-CM | POA: Diagnosis present

## 2015-04-15 DIAGNOSIS — R7989 Other specified abnormal findings of blood chemistry: Secondary | ICD-10-CM | POA: Diagnosis not present

## 2015-04-15 DIAGNOSIS — Z7982 Long term (current) use of aspirin: Secondary | ICD-10-CM

## 2015-04-15 DIAGNOSIS — I252 Old myocardial infarction: Secondary | ICD-10-CM

## 2015-04-15 DIAGNOSIS — Z841 Family history of disorders of kidney and ureter: Secondary | ICD-10-CM

## 2015-04-15 DIAGNOSIS — Z91120 Patient's intentional underdosing of medication regimen due to financial hardship: Secondary | ICD-10-CM

## 2015-04-15 DIAGNOSIS — Z809 Family history of malignant neoplasm, unspecified: Secondary | ICD-10-CM

## 2015-04-15 DIAGNOSIS — Z8249 Family history of ischemic heart disease and other diseases of the circulatory system: Secondary | ICD-10-CM

## 2015-04-15 DIAGNOSIS — R0789 Other chest pain: Secondary | ICD-10-CM | POA: Diagnosis not present

## 2015-04-15 DIAGNOSIS — R748 Abnormal levels of other serum enzymes: Secondary | ICD-10-CM | POA: Diagnosis present

## 2015-04-15 DIAGNOSIS — Z87891 Personal history of nicotine dependence: Secondary | ICD-10-CM

## 2015-04-15 DIAGNOSIS — R079 Chest pain, unspecified: Secondary | ICD-10-CM | POA: Diagnosis not present

## 2015-04-15 DIAGNOSIS — Z79899 Other long term (current) drug therapy: Secondary | ICD-10-CM

## 2015-04-15 LAB — CBC WITH DIFFERENTIAL/PLATELET
Basophils Absolute: 0 10*3/uL (ref 0.0–0.1)
Basophils Relative: 0 %
Eosinophils Absolute: 0.1 10*3/uL (ref 0.0–0.7)
Eosinophils Relative: 1 %
HCT: 39.6 % (ref 39.0–52.0)
Hemoglobin: 13.2 g/dL (ref 13.0–17.0)
Lymphocytes Relative: 16 %
Lymphs Abs: 1.2 10*3/uL (ref 0.7–4.0)
MCH: 28 pg (ref 26.0–34.0)
MCHC: 33.3 g/dL (ref 30.0–36.0)
MCV: 84.1 fL (ref 78.0–100.0)
Monocytes Absolute: 0.5 10*3/uL (ref 0.1–1.0)
Monocytes Relative: 6 %
Neutro Abs: 5.5 10*3/uL (ref 1.7–7.7)
Neutrophils Relative %: 77 %
Platelets: 218 10*3/uL (ref 150–400)
RBC: 4.71 MIL/uL (ref 4.22–5.81)
RDW: 15 % (ref 11.5–15.5)
WBC: 7.2 10*3/uL (ref 4.0–10.5)

## 2015-04-15 LAB — URINALYSIS, ROUTINE W REFLEX MICROSCOPIC
Bilirubin Urine: NEGATIVE
Glucose, UA: NEGATIVE mg/dL
Hgb urine dipstick: NEGATIVE
Ketones, ur: NEGATIVE mg/dL
Leukocytes, UA: NEGATIVE
Nitrite: NEGATIVE
Protein, ur: NEGATIVE mg/dL
Specific Gravity, Urine: 1.011 (ref 1.005–1.030)
pH: 5 (ref 5.0–8.0)

## 2015-04-15 LAB — BASIC METABOLIC PANEL
Anion gap: 9 (ref 5–15)
BUN: 19 mg/dL (ref 6–20)
CO2: 25 mmol/L (ref 22–32)
Calcium: 9 mg/dL (ref 8.9–10.3)
Chloride: 101 mmol/L (ref 101–111)
Creatinine, Ser: 1.16 mg/dL (ref 0.61–1.24)
GFR calc Af Amer: >60 mL/min (ref >60)
GFR calc non Af Amer: >60 mL/min (ref >60)
Glucose, Bld: 129 mg/dL — ABNORMAL HIGH (ref 65–99)
Potassium: 4.5 mmol/L (ref 3.5–5.1)
Sodium: 135 mmol/L (ref 135–145)

## 2015-04-15 LAB — TROPONIN I: Troponin I: 0.11 ng/mL — ABNORMAL HIGH (ref <0.031)

## 2015-04-15 MED ORDER — LISINOPRIL 20 MG PO TABS
20.0000 mg | ORAL_TABLET | Freq: Once | ORAL | Status: AC
Start: 2015-04-15 — End: 2015-04-15
  Administered 2015-04-15: 20 mg via ORAL
  Filled 2015-04-15: qty 1

## 2015-04-15 MED ORDER — CARVEDILOL 25 MG PO TABS
25.0000 mg | ORAL_TABLET | Freq: Two times a day (BID) | ORAL | Status: DC
  Administered 2015-04-16 – 2015-04-17 (×4): 25 mg via ORAL
  Filled 2015-04-15 (×4): qty 1

## 2015-04-15 MED ORDER — NITROGLYCERIN 0.4 MG SL SUBL: 0.4000 mg | SUBLINGUAL_TABLET | SUBLINGUAL | Status: DC | PRN

## 2015-04-15 MED ORDER — ISOSORB DINITRATE-HYDRALAZINE 20-37.5 MG PO TABS
1.0000 | ORAL_TABLET | Freq: Once | ORAL | Status: AC
  Administered 2015-04-15: 1 via ORAL
  Filled 2015-04-15: qty 1

## 2015-04-15 NOTE — ED Notes (Signed)
Per the patient, he was eating dinner when he experienced an acute onset of chest pressure in the center of his chest. Pt denies pain radiated anywhere, shortness of breath, or N/V. Pt took 324 mg of ASA prior to EMS arrival. EMS gave 0.4mg  of nitro (pill) to the patient upon arrival on scene. Pt arrived to the ER pain free at this time. Pt was discharged from this facility last Friday, post heart catherization.

## 2015-04-16 ENCOUNTER — Encounter (HOSPITAL_COMMUNITY): Payer: Self-pay

## 2015-04-16 ENCOUNTER — Emergency Department (HOSPITAL_COMMUNITY): Payer: Medicare Other

## 2015-04-16 DIAGNOSIS — I10 Essential (primary) hypertension: Secondary | ICD-10-CM | POA: Diagnosis not present

## 2015-04-16 DIAGNOSIS — Z79899 Other long term (current) drug therapy: Secondary | ICD-10-CM | POA: Diagnosis not present

## 2015-04-16 DIAGNOSIS — Z9119 Patient's noncompliance with other medical treatment and regimen: Secondary | ICD-10-CM | POA: Diagnosis not present

## 2015-04-16 DIAGNOSIS — R7989 Other specified abnormal findings of blood chemistry: Secondary | ICD-10-CM | POA: Diagnosis not present

## 2015-04-16 DIAGNOSIS — Z8249 Family history of ischemic heart disease and other diseases of the circulatory system: Secondary | ICD-10-CM | POA: Diagnosis not present

## 2015-04-16 DIAGNOSIS — I25110 Atherosclerotic heart disease of native coronary artery with unstable angina pectoris: Secondary | ICD-10-CM | POA: Diagnosis present

## 2015-04-16 DIAGNOSIS — R0789 Other chest pain: Secondary | ICD-10-CM | POA: Diagnosis not present

## 2015-04-16 DIAGNOSIS — Z809 Family history of malignant neoplasm, unspecified: Secondary | ICD-10-CM | POA: Diagnosis not present

## 2015-04-16 DIAGNOSIS — I200 Unstable angina: Secondary | ICD-10-CM

## 2015-04-16 DIAGNOSIS — Z91120 Patient's intentional underdosing of medication regimen due to financial hardship: Secondary | ICD-10-CM | POA: Diagnosis not present

## 2015-04-16 DIAGNOSIS — Z7982 Long term (current) use of aspirin: Secondary | ICD-10-CM | POA: Diagnosis not present

## 2015-04-16 DIAGNOSIS — Z87891 Personal history of nicotine dependence: Secondary | ICD-10-CM | POA: Diagnosis not present

## 2015-04-16 DIAGNOSIS — R748 Abnormal levels of other serum enzymes: Secondary | ICD-10-CM | POA: Diagnosis present

## 2015-04-16 DIAGNOSIS — Z9861 Coronary angioplasty status: Secondary | ICD-10-CM | POA: Diagnosis not present

## 2015-04-16 DIAGNOSIS — I252 Old myocardial infarction: Secondary | ICD-10-CM | POA: Diagnosis not present

## 2015-04-16 DIAGNOSIS — E119 Type 2 diabetes mellitus without complications: Secondary | ICD-10-CM | POA: Diagnosis present

## 2015-04-16 DIAGNOSIS — Z841 Family history of disorders of kidney and ureter: Secondary | ICD-10-CM | POA: Diagnosis not present

## 2015-04-16 LAB — BASIC METABOLIC PANEL
ANION GAP: 11 (ref 5–15)
BUN: 17 mg/dL (ref 6–20)
CALCIUM: 9.3 mg/dL (ref 8.9–10.3)
CO2: 26 mmol/L (ref 22–32)
Chloride: 102 mmol/L (ref 101–111)
Creatinine, Ser: 1.15 mg/dL (ref 0.61–1.24)
Glucose, Bld: 101 mg/dL — ABNORMAL HIGH (ref 65–99)
Potassium: 4.4 mmol/L (ref 3.5–5.1)
SODIUM: 139 mmol/L (ref 135–145)

## 2015-04-16 LAB — HEPARIN LEVEL (UNFRACTIONATED)
HEPARIN UNFRACTIONATED: 0.47 [IU]/mL (ref 0.30–0.70)
Heparin Unfractionated: 0.72 IU/mL — ABNORMAL HIGH (ref 0.30–0.70)

## 2015-04-16 LAB — GLUCOSE, CAPILLARY
GLUCOSE-CAPILLARY: 150 mg/dL — AB (ref 65–99)
GLUCOSE-CAPILLARY: 143 mg/dL — AB (ref 65–99)
Glucose-Capillary: 99 mg/dL (ref 65–99)

## 2015-04-16 LAB — TROPONIN I
TROPONIN I: 0.29 ng/mL — AB (ref <0.031)
TROPONIN I: 0.18 ng/mL — AB (ref <0.031)

## 2015-04-16 MED ORDER — CLOPIDOGREL BISULFATE 300 MG PO TABS
600.0000 mg | ORAL_TABLET | Freq: Once | ORAL | Status: AC
  Administered 2015-04-16: 600 mg via ORAL
  Filled 2015-04-16: qty 2

## 2015-04-16 MED ORDER — ASPIRIN EC 81 MG PO TBEC
81.0000 mg | DELAYED_RELEASE_TABLET | Freq: Every day | ORAL | Status: DC
  Administered 2015-04-16 – 2015-04-17 (×2): 81 mg via ORAL
  Filled 2015-04-16 (×2): qty 1

## 2015-04-16 MED ORDER — HEPARIN (PORCINE) IN NACL 100-0.45 UNIT/ML-% IJ SOLN
1350.0000 [IU]/h | INTRAMUSCULAR | Status: DC
  Administered 2015-04-16: 1400 [IU]/h via INTRAVENOUS
  Administered 2015-04-16: 1350 [IU]/h via INTRAVENOUS
  Filled 2015-04-16 (×2): qty 250

## 2015-04-16 MED ORDER — ISOSORBIDE MONONITRATE ER 30 MG PO TB24
30.0000 mg | ORAL_TABLET | Freq: Every day | ORAL | Status: DC
  Administered 2015-04-16 – 2015-04-17 (×2): 30 mg via ORAL
  Filled 2015-04-16 (×2): qty 1

## 2015-04-16 MED ORDER — ACETAMINOPHEN 325 MG PO TABS: 650.0000 mg | ORAL_TABLET | ORAL | Status: DC | PRN

## 2015-04-16 MED ORDER — ASPIRIN 81 MG PO CHEW
324.0000 mg | CHEWABLE_TABLET | Freq: Once | ORAL | Status: AC
  Administered 2015-04-16: 324 mg via ORAL
  Filled 2015-04-16: qty 4

## 2015-04-16 MED ORDER — HEPARIN BOLUS VIA INFUSION
4000.0000 [IU] | Freq: Once | INTRAVENOUS | Status: AC
  Administered 2015-04-16: 4000 [IU] via INTRAVENOUS
  Filled 2015-04-16: qty 4000

## 2015-04-16 MED ORDER — HEPARIN SODIUM (PORCINE) 5000 UNIT/ML IJ SOLN
4000.0000 [IU] | Freq: Once | INTRAMUSCULAR | Status: AC
  Administered 2015-04-16: 4000 [IU] via INTRAVENOUS
  Filled 2015-04-16: qty 1

## 2015-04-16 MED ORDER — CLOPIDOGREL BISULFATE 75 MG PO TABS
75.0000 mg | ORAL_TABLET | Freq: Every day | ORAL | Status: DC
  Administered 2015-04-16 – 2015-04-17 (×2): 75 mg via ORAL
  Filled 2015-04-16 (×2): qty 1

## 2015-04-16 MED ORDER — ATORVASTATIN CALCIUM 80 MG PO TABS
80.0000 mg | ORAL_TABLET | Freq: Every day | ORAL | Status: DC
  Administered 2015-04-16 – 2015-04-17 (×2): 80 mg via ORAL
  Filled 2015-04-16 (×2): qty 1

## 2015-04-16 MED ORDER — NITROGLYCERIN 0.4 MG SL SUBL
0.4000 mg | SUBLINGUAL_TABLET | SUBLINGUAL | Status: DC | PRN
Start: 2015-04-16 — End: 2015-04-17

## 2015-04-16 MED ORDER — CARVEDILOL 25 MG PO TABS: 25.0000 mg | ORAL_TABLET | Freq: Two times a day (BID) | ORAL | Status: DC

## 2015-04-16 NOTE — ED Provider Notes (Signed)
CSN: [id number]     Arrival date & time 04/15/15  2106 History   First MD Initiated Contact with Patient 04/15/15 2122     Chief Complaint  Patient presents with  . Chest Pain     (Consider location/radiation/quality/duration/timing/severity/associated sxs/prior Treatment) HPI Patient presents to the emergency department with Chest pressure and discomfort that started getting worse over the last day.  The patient states that he was recently in the hospital and had 2 stents placed.  He states that he was unable to get any does medications filled.  He is not currently taking his blood pressure medicines or Plavix.  The patient states that nothing seems make his condition, better or worse.  He states he does not have any significant abdominal pain, nausea, vomiting, weakness, dizziness, headache, blurred vision, back pain, neck pain, fever, dysuria, incontinence, bloody stool, hematemesis, lightheadedness, rash, near syncope or syncope Past Medical History  Diagnosis Date  . Coronary artery disease     a. 08/2007 s/p DES to RCA/RI;  b. 07/2006 s/p DES to p/d LCX;  c. 11/2007 CABGx5 (LIMA->LAD, VG->D1, LRA->OM1, VG->PDA->LPL;  d. 09/2012 STEMI/Cath: LM nl, LAD 100p, LCX 70p/90d, OM1 95, LPL 90d, RCA 100, LIMA->LAD nl (LAD 95d), RA->OM nl, VG->PDA->LPL 100, unable to locate VG->Diag, EF 60-65%->Med Rx.  . Hypertension   . Diabetes mellitus without complication (HCC)     a. A1c 7.0 09/2012.  . Noncompliance   . Hyperlipidemia   . Claudication (HCC)   . NSTEMI (non-ST elevated myocardial infarction) (HCC) 04/04/2015    DES x 2 CFX   Past Surgical History  Procedure Laterality Date  . Coronary artery bypass graft      2009 LIMA to LAD, SVG to Diag, SVG to PDA and PL, left radial to OM  . Left heart cath N/A 10/24/2012    Procedure: LEFT HEART CATH;  Surgeon: Thomas A Kelly, MD;  Location: [facility] CATH LAB;  Service: Cardiovascular;  Laterality: N/A;  . Cardiac catheterization N/A 04/06/2015    Procedure:  Left Heart Cath and Cors/Grafts Angiography;  Surgeon: Jonathan J Berry, MD;  Location: [facility] INVASIVE CV LAB;  Service: Cardiovascular;  Laterality: N/A;  . Cardiac catheterization N/A 04/06/2015    Procedure: Coronary Stent Intervention;  Surgeon: Jonathan J Berry, MD;  2.5 mm x 12 mm long Synergy DES followed by  2.5 mm x 16 mm long Synergy DES     Family History  Problem Relation Age of Onset  . Heart failure Mother   . Cancer Mother   . Heart failure Father   . CAD Mother 76  . Kidney failure Brother    Social History  Substance Use Topics  . Smoking status: Former Smoker    Quit date: 10/25/2010  . Smokeless tobacco: None  . Alcohol Use: No    Review of Systems  All other systems negative except as documented in the HPI. All pertinent positives and negatives as reviewed in the HPI.  Allergies  Review of patient's allergies indicates no known allergies.  Home Medications   Prior to Admission medications   Medication Sig Start Date End Date Taking? Authorizing Provider  aspirin EC 81 MG EC tablet Take 1 tablet (81 mg total) by mouth daily. 10/28/12  Yes Christopher R Berge, NP  amLODipine (NORVASC) 5 MG tablet Take 1 tablet (5 mg total) by mouth daily. 04/08/15   Rhonda G Barrett, PA-C  atorvastatin (LIPITOR) 80 MG tablet Take 1 tablet (80 mg total) by mouth daily. 04/08/15     Rhonda G Barrett, PA-C  carvedilol (COREG) 25 MG tablet Take 1 tablet (25 mg total) by mouth 2 (two) times daily with a meal. 04/08/15   Rhonda G Barrett, PA-C  ciprofloxacin (CIPRO) 500 MG tablet Take 1 tablet (500 mg total) by mouth 2 (two) times daily. 04/08/15   Rhonda G Barrett, PA-C  clopidogrel (PLAVIX) 75 MG tablet Take 1 tablet (75 mg total) by mouth daily. 04/08/15   Rhonda G Barrett, PA-C  glucose monitoring kit (FREESTYLE) monitoring kit 1 each by Does not apply route once. Glucometer with test strips and lancets to check blood glucose BID ac breakfast and dinner. 10/28/12   Carly Applegate R Berge, NP   isosorbide mononitrate (IMDUR) 30 MG 24 hr tablet Take 1 tablet (30 mg total) by mouth daily. 04/08/15   Rhonda G Barrett, PA-C  metFORMIN (GLUCOPHAGE) 500 MG tablet Take 1 tablet (500 mg total) by mouth 2 (two) times daily with a meal. Patient not taking: Reported on 04/15/2015 03/28/14   Carlos Madera, MD  nitroGLYCERIN (NITROSTAT) 0.4 MG SL tablet Place 1 tablet (0.4 mg total) under the tongue every 5 (five) minutes as needed for chest pain. 04/08/15   Rhonda G Barrett, PA-C   BP 203/113 mmHg  Pulse 89  Temp(Src) 98.1 F (36.7 C) (Oral)  Resp 23  SpO2 98% Physical Exam  Constitutional: He is oriented to person, place, and time. He appears well-developed and well-nourished. No distress.  HENT:  Head: Normocephalic and atraumatic.  Mouth/Throat: Oropharynx is clear and moist.  Eyes: Pupils are equal, round, and reactive to light.  Neck: Normal range of motion. Neck supple.  Cardiovascular: Normal rate, regular rhythm and normal heart sounds.  Exam reveals no gallop and no friction rub.   No murmur heard. Pulmonary/Chest: Effort normal and breath sounds normal. No respiratory distress. He has no wheezes.  Abdominal: Soft. Bowel sounds are normal. He exhibits no distension. There is no tenderness.  Neurological: He is alert and oriented to person, place, and time. He exhibits normal muscle tone. Coordination normal.  Skin: Skin is warm and dry. No rash noted. No erythema.  Psychiatric: He has a normal mood and affect. His behavior is normal.  Nursing note and vitals reviewed.   ED Course  Procedures (including critical care time) Labs Review Labs Reviewed  BASIC METABOLIC PANEL - Abnormal; Notable for the following:    Glucose, Bld 129 (*)    All other components within normal limits  TROPONIN I - Abnormal; Notable for the following:    Troponin I 0.11 (*)    All other components within normal limits  TROPONIN I - Abnormal; Notable for the following:    Troponin I 0.29 (*)    All  other components within normal limits  TROPONIN I - Abnormal; Notable for the following:    Troponin I 0.18 (*)    All other components within normal limits  BASIC METABOLIC PANEL - Abnormal; Notable for the following:    Glucose, Bld 101 (*)    All other components within normal limits  HEPARIN LEVEL (UNFRACTIONATED) - Abnormal; Notable for the following:    Heparin Unfractionated 0.72 (*)    All other components within normal limits  GLUCOSE, CAPILLARY - Abnormal; Notable for the following:    Glucose-Capillary 150 (*)    All other components within normal limits  GLUCOSE, CAPILLARY - Abnormal; Notable for the following:    Glucose-Capillary 143 (*)    All other components within normal limits  CBC   WITH DIFFERENTIAL/PLATELET  URINALYSIS, ROUTINE W REFLEX MICROSCOPIC (NOT AT [facility])  HEPARIN LEVEL (UNFRACTIONATED)  GLUCOSE, CAPILLARY  HEPARIN LEVEL (UNFRACTIONATED)  CBC    Imaging Review Dg Chest 2 View  04/16/2015  CLINICAL DATA:  Acute onset of chest pressure while eating dinner tonight. Recent heart catheterization. EXAM: CHEST  2 VIEW COMPARISON:  04/04/2015 FINDINGS: There is unchanged moderate cardiomegaly. There is prior sternotomy and CABG. The lungs are clear. There is no pleural effusion. The pulmonary vasculature is normal. Hilar and mediastinal contours are unremarkable and unchanged. IMPRESSION: Unchanged cardiomegaly.  No acute cardiopulmonary findings. Electronically Signed   By: Daniel R Mitchell M.D.   On: 04/16/2015 01:50   I have personally reviewed and evaluated these images and lab results as part of my medical decision-making.   EKG Interpretation None      MDM   Final diagnoses:  None      Patient will need to be admitted for serial troponins and observation due to the fact these have been taking his home medicines, blood pressure is significantly elevated and has not been taking his Plavix history none is elevated in this seems abnormal due to the  fact that his troponin should have normalized on this point  Christopher Lawyer, PA-C 04/17/15 0202  Ankit Nanavati, MD 04/17/15 0737 

## 2015-04-16 NOTE — Progress Notes (Signed)
ANTICOAGULATION CONSULT NOTE - Initial Consult  Pharmacy Consult for Heparin Indication: chest pain/ACS  No Known Allergies  Patient Measurements: Height: 6\' 1"  (185.4 cm) Weight: 209 lb 14.4 oz (95.21 kg) IBW/kg (Calculated) : 79.9 Heparin Dosing Weight: 95 kg  Vital Signs: Temp: 98.3 F (36.8 C) (03/19 0457) Temp Source: Oral (03/19 0457) BP: 157/105 mmHg (03/19 0457) Pulse Rate: 94 (03/19 0457)  Labs:  Recent Labs  04/15/15 2239 04/16/15 0514 04/16/15 0912  HGB 13.2  --   --   HCT 39.6  --   --   PLT 218  --   --   HEPARINUNFRC  --   --  0.72*  CREATININE 1.16 1.15  --   TROPONINI 0.11* 0.29*  --     Estimated Creatinine Clearance: 79.1 mL/min (by C-G formula based on Cr of 1.15).   Medical History: Past Medical History  Diagnosis Date  . Coronary artery disease     a. 08/2007 s/p DES to RCA/RI;  b. 07/2006 s/p DES to p/d LCX;  c. 11/2007 CABGx5 (LIMA->LAD, VG->D1, LRA->OM1, VG->PDA->LPL;  d. 09/2012 STEMI/Cath: LM nl, LAD 100p, LCX 70p/90d, OM1 95, LPL 90d, RCA 100, LIMA->LAD nl (LAD 95d), RA->OM nl, VG->PDA->LPL 100, unable to locate VG->Diag, EF 60-65%->Med Rx.  Marland Kitchen. Hypertension   . Diabetes mellitus without complication (HCC)     a. A1c 7.0 09/2012.  Marland Kitchen. Noncompliance   . Hyperlipidemia   . Claudication (HCC)   . NSTEMI (non-ST elevated myocardial infarction) (HCC) 04/04/2015    DES x 2 CFX    Medications:  Norvasc  ASA  Lipitor  Coreg   Plavix  Imdur  Ntg    Assessment: 60 y.o. male with chest pain for heparin.  HL slightly supratherapeutic at 0.72, Hgb 13.2, Plts 218, No s/sx bleeding noted.   Goal of Therapy:  Heparin level 0.3-0.7 units/ml Monitor platelets by anticoagulation protocol: Yes   Plan:  Decrease Heparin to 1350 units/hr Check heparin level in 6 hours. Daily HL and CBC Monitor for s/sx of bleeding   Hazle NordmannKelsy Combs, PharmD Pharmacy Resident [phone]

## 2015-04-16 NOTE — Progress Notes (Signed)
ANTICOAGULATION CONSULT NOTE - Initial Consult  Pharmacy Consult for Heparin Indication: chest pain/ACS  No Known Allergies  Patient Measurements: Height: 6\' 1"  (185.4 cm) Weight: 209 lb 14.4 oz (95.21 kg) IBW/kg (Calculated) : 79.9 Heparin Dosing Weight: 95 kg  Vital Signs: Temp: 97.9 F (36.6 C) (03/19 1245) Temp Source: Oral (03/19 1245) BP: 111/69 mmHg (03/19 1245) Pulse Rate: 79 (03/19 1245)  Labs:  Recent Labs  04/15/15 2239 04/16/15 0514 04/16/15 0912 04/16/15 1134 04/16/15 1551  HGB 13.2  --   --   --   --   HCT 39.6  --   --   --   --   PLT 218  --   --   --   --   HEPARINUNFRC  --   --  0.72*  --  0.47  CREATININE 1.16 1.15  --   --   --   TROPONINI 0.11* 0.29*  --  0.18*  --     Estimated Creatinine Clearance: 79.1 mL/min (by C-G formula based on Cr of 1.15).   Medical History: Past Medical History  Diagnosis Date  . Coronary artery disease     a. 08/2007 s/p DES to RCA/RI;  b. 07/2006 s/p DES to p/d LCX;  c. 11/2007 CABGx5 (LIMA->LAD, VG->D1, LRA->OM1, VG->PDA->LPL;  d. 09/2012 STEMI/Cath: LM nl, LAD 100p, LCX 70p/90d, OM1 95, LPL 90d, RCA 100, LIMA->LAD nl (LAD 95d), RA->OM nl, VG->PDA->LPL 100, unable to locate VG->Diag, EF 60-65%->Med Rx.  Marland Kitchen. Hypertension   . Diabetes mellitus without complication (HCC)     a. A1c 7.0 09/2012.  Marland Kitchen. Noncompliance   . Hyperlipidemia   . Claudication (HCC)   . NSTEMI (non-ST elevated myocardial infarction) (HCC) 04/04/2015    DES x 2 CFX    Medications:  Norvasc  ASA  Lipitor  Coreg   Plavix  Imdur  Ntg    Assessment: 60 y.o. male with chest pain for heparin.  HL therapeutic at 0.47  Goal of Therapy:  Heparin level 0.3-0.7 units/ml Monitor platelets by anticoagulation protocol: Yes   Plan:  Continue Heparin 1350 units/hr Daily HL and CBC Monitor for s/sx of bleeding   Isaac BlissMichael Siddhartha Hoback, PharmD, BCPS, [facility] CampusBCCCP Clinical Pharmacist Pager [phone] 04/16/2015 4:57 PM

## 2015-04-16 NOTE — Progress Notes (Signed)
CSW c/s received, however, Care Management assists with medication needs.  CSW will sign off.  Please re-consult as necessary.  Pollyann SavoyJody Sarae Nicholes, LCSW Weekend Coverage [phone]

## 2015-04-16 NOTE — Progress Notes (Signed)
ANTICOAGULATION CONSULT NOTE - Initial Consult  Pharmacy Consult for Heparin Indication: chest pain/ACS  No Known Allergies  Patient Measurements:   Heparin Dosing Weight: 95 kg  Vital Signs: Temp: 98.1 F (36.7 C) (03/18 2123) Temp Source: Oral (03/18 2123) BP: 183/108 mmHg (03/19 0100) Pulse Rate: 105 (03/19 0100)  Labs:  Recent Labs  04/15/15 2239  HGB 13.2  HCT 39.6  PLT 218  CREATININE 1.16  TROPONINI 0.11*    Estimated Creatinine Clearance: 85.7 mL/min (by C-G formula based on Cr of 1.16).   Medical History: Past Medical History  Diagnosis Date  . Coronary artery disease     a. 08/2007 s/p DES to RCA/RI;  b. 07/2006 s/p DES to p/d LCX;  c. 11/2007 CABGx5 (LIMA->LAD, VG->D1, LRA->OM1, VG->PDA->LPL;  d. 09/2012 STEMI/Cath: LM nl, LAD 100p, LCX 70p/90d, OM1 95, LPL 90d, RCA 100, LIMA->LAD nl (LAD 95d), RA->OM nl, VG->PDA->LPL 100, unable to locate VG->Diag, EF 60-65%->Med Rx.  Marland Kitchen. Hypertension   . Diabetes mellitus without complication (HCC)     a. A1c 7.0 09/2012.  Marland Kitchen. Noncompliance   . Hyperlipidemia   . Claudication (HCC)   . NSTEMI (non-ST elevated myocardial infarction) (HCC) 04/04/2015    DES x 2 CFX    Medications:  Norvasc  ASA  Lipitor  Coreg   Plavix  Imdur  Ntg    Assessment: 60 y.o. male with chest pain for heparin   Goal of Therapy:  Heparin level 0.3-0.7 units/ml Monitor platelets by anticoagulation protocol: Yes   Plan:  Heparin 4000 units IV bolus, then start heparin 1400 units/hr Check heparin level in 6 hours.  Donnarae Rae, Gary FleetGregory Vernon 04/16/2015,1:51 AM

## 2015-04-16 NOTE — H&P (Signed)
CC: CP and SOB  HPI: 60 yo CA man with HTN. T2DM, CAD with prior PCI (DES to p/d LCX in 07/2006, DES to RCA/RI in 08/2007) and multivessel CABGx5 in 11/2007, STEMI in 2014 (felt to be due to occlusion of SVG-D1) who was recently admitted 04/04/15 to 04/08/15 for NSTEMI and underwent PCI to ostial to mid LCX lesion but distal lesion was left without intervention, now presents with chest pressure and SOB for the past couple days. He was not able to fill his meds given financial issues. No syncope, orthopnea, PND, edema, palpitations, diaphoresis. In ER, Trp 0.1, SBP over 200.  In ER given ASA 325 mg po , Heparin 4000 units bolus, Plavix 600 ms, Lisinopril 20 mg and Bidil po.    Review of Systems:  12 systems reviewed unremarkable except as noted in HPI   Past Medical History  Diagnosis Date  . Coronary artery disease     a. 08/2007 s/p DES to RCA/RI;  b. 07/2006 s/p DES to p/d LCX;  c. 11/2007 CABGx5 (LIMA->LAD, VG->D1, LRA->OM1, VG->PDA->LPL;  d. 09/2012 STEMI/Cath: LM nl, LAD 100p, LCX 70p/90d, OM1 95, LPL 90d, RCA 100, LIMA->LAD nl (LAD 95d), RA->OM nl, VG->PDA->LPL 100, unable to locate VG->Diag, EF 60-65%->Med Rx.  Marland Kitchen Hypertension   . Diabetes mellitus without complication ([place])     a. A1c 7.0 09/2012.  Marland Kitchen Noncompliance   . Hyperlipidemia   . Claudication ([place])   . NSTEMI (non-ST elevated myocardial infarction) ([place]) 04/04/2015    DES x 2 CFX   No current facility-administered medications on file prior to encounter.   Current Outpatient Prescriptions on File Prior to Encounter  Medication Sig Dispense Refill  . aspirin EC 81 MG EC tablet Take 1 tablet (81 mg total) by mouth daily.    Marland Kitchen amLODipine (NORVASC) 5 MG tablet Take 1 tablet (5 mg total) by mouth daily. 30 tablet 6  . atorvastatin (LIPITOR) 80 MG tablet Take 1 tablet (80 mg total) by mouth daily. 30 tablet 6  . carvedilol (COREG) 25 MG tablet Take 1 tablet (25 mg total) by mouth 2 (two) times daily with a meal. 60 tablet 6  .  ciprofloxacin (CIPRO) 500 MG tablet Take 1 tablet (500 mg total) by mouth 2 (two) times daily. 4 tablet 0  . clopidogrel (PLAVIX) 75 MG tablet Take 1 tablet (75 mg total) by mouth daily. 30 tablet 11  . glucose monitoring kit (FREESTYLE) monitoring kit 1 each by Does not apply route once. Glucometer with test strips and lancets to check blood glucose BID ac breakfast and dinner. 1 each 0  . isosorbide mononitrate (IMDUR) 30 MG 24 hr tablet Take 1 tablet (30 mg total) by mouth daily. 30 tablet 6  . metFORMIN (GLUCOPHAGE) 500 MG tablet Take 1 tablet (500 mg total) by mouth 2 (two) times daily with a meal. (Patient not taking: Reported on 04/15/2015) 60 tablet 1  . nitroGLYCERIN (NITROSTAT) 0.4 MG SL tablet Place 1 tablet (0.4 mg total) under the tongue every 5 (five) minutes as needed for chest pain. 25 tablet 3     No Known Allergies  Social History   Social History  . Marital Status: Married    Spouse Name: N/A  . Number of Children: 3  . Years of Education: N/A   Occupational History  . Not on file.   Social History Main Topics  . Smoking status: Former Smoker    Quit date: 10/25/2010  . Smokeless tobacco: Not on  file  . Alcohol Use: No  . Drug Use: No  . Sexual Activity: Not on file   Other Topics Concern  . Not on file   Social History Narrative   Lives at home with wife.      Family History  Problem Relation Age of Onset  . Heart failure Mother   . Cancer Mother   . Heart failure Father   . CAD Mother 70  . Kidney failure Brother     PHYSICAL EXAM: Filed Vitals:   04/16/15 0030 04/16/15 0100  BP: 184/110 183/108  Pulse: 96 105  Temp:    Resp: 16 21   General:  Disheveled, poor personal hygiene No respiratory difficulty HEENT: EOMI, PERL, moint oral mucosa Neck: supple. no JVD. Carotids 2+ bilat; no bruits. No lymphadenopathy or thryomegaly appreciated. Cor: PMI nondisplaced. Regular rate & rhythm. No rubs, gallops or murmurs. Lungs: clear Abdomen: soft,  nontender, nondistended. No hepatosplenomegaly. No bruits or masses. Good bowel sounds. Extremities: no cyanosis, clubbing, rash, edema Neuro: alert & oriented x 3, cranial nerves grossly intact. moves all 4 extremities w/o difficulty. Affect pleasant.  ECG: NSR, t inv inf leads, borderline voltage criteria for LVH -- not much changed from 04/07/2015  Results for orders placed or performed during the hospital encounter of 04/15/15 (from the past 24 hour(s))  Basic metabolic panel     Status: Abnormal   Collection Time: 04/15/15 10:39 PM  Result Value Ref Range   Sodium 135 135 - 145 mmol/L   Potassium 4.5 3.5 - 5.1 mmol/L   Chloride 101 101 - 111 mmol/L   CO2 25 22 - 32 mmol/L   Glucose, Bld 129 (H) 65 - 99 mg/dL   BUN 19 6 - 20 mg/dL   Creatinine, Ser 1.16 0.61 - 1.24 mg/dL   Calcium 9.0 8.9 - 10.3 mg/dL   GFR calc non Af Amer >60 >60 mL/min   GFR calc Af Amer >60 >60 mL/min   Anion gap 9 5 - 15  CBC with Differential     Status: None   Collection Time: 04/15/15 10:39 PM  Result Value Ref Range   WBC 7.2 4.0 - 10.5 K/uL   RBC 4.71 4.22 - 5.81 MIL/uL   Hemoglobin 13.2 13.0 - 17.0 g/dL   HCT 39.6 39.0 - 52.0 %   MCV 84.1 78.0 - 100.0 fL   MCH 28.0 26.0 - 34.0 pg   MCHC 33.3 30.0 - 36.0 g/dL   RDW 15.0 11.5 - 15.5 %   Platelets 218 150 - 400 K/uL   Neutrophils Relative % 77 %   Neutro Abs 5.5 1.7 - 7.7 K/uL   Lymphocytes Relative 16 %   Lymphs Abs 1.2 0.7 - 4.0 K/uL   Monocytes Relative 6 %   Monocytes Absolute 0.5 0.1 - 1.0 K/uL   Eosinophils Relative 1 %   Eosinophils Absolute 0.1 0.0 - 0.7 K/uL   Basophils Relative 0 %   Basophils Absolute 0.0 0.0 - 0.1 K/uL  Troponin I     Status: Abnormal   Collection Time: 04/15/15 10:39 PM  Result Value Ref Range   Troponin I 0.11 (H) <0.031 ng/mL  Urinalysis, Routine w reflex microscopic     Status: None   Collection Time: 04/15/15 11:25 PM  Result Value Ref Range   Color, Urine YELLOW YELLOW   APPearance CLEAR CLEAR    Specific Gravity, Urine 1.011 1.005 - 1.030   pH 5.0 5.0 - 8.0   Glucose, UA  NEGATIVE NEGATIVE mg/dL   Hgb urine dipstick NEGATIVE NEGATIVE   Bilirubin Urine NEGATIVE NEGATIVE   Ketones, ur NEGATIVE NEGATIVE mg/dL   Protein, ur NEGATIVE NEGATIVE mg/dL   Nitrite NEGATIVE NEGATIVE   Leukocytes, UA NEGATIVE NEGATIVE   Dg Chest 2 View  04/16/2015  CLINICAL DATA:  Acute onset of chest pressure while eating dinner tonight. Recent heart catheterization. EXAM: CHEST  2 VIEW COMPARISON:  04/04/2015 FINDINGS: There is unchanged moderate cardiomegaly. There is prior sternotomy and CABG. The lungs are clear. There is no pleural effusion. The pulmonary vasculature is normal. Hilar and mediastinal contours are unremarkable and unchanged. IMPRESSION: Unchanged cardiomegaly.  No acute cardiopulmonary findings. Electronically Signed   By: Andreas Newport M.D.   On: 04/16/2015 01:50     ASSESSMENT:  1. Unstable angina  2. Uncontrolled HTN  3. Noncompliance - reports cannot afford medications so did not take any meds since recent discharge    PLAN/DISCUSSION:  Please refer to orders for details   Admit on tele  ASA 325 po, Plavix 600 mg po, Lisinopril 20 mg, Bidil and Heparin bolus given in ER Will restart Coreg 25 mg po bid, lipitor, imdur Continue lisinopril  Stop norvasc Will need assistance with meds     Wandra Mannan, MD Cardiology

## 2015-04-17 ENCOUNTER — Telehealth: Payer: Self-pay | Admitting: Cardiology

## 2015-04-17 DIAGNOSIS — R7989 Other specified abnormal findings of blood chemistry: Secondary | ICD-10-CM

## 2015-04-17 DIAGNOSIS — I10 Essential (primary) hypertension: Secondary | ICD-10-CM

## 2015-04-17 LAB — CBC
HEMATOCRIT: 36 % — AB (ref 39.0–52.0)
Hemoglobin: 11.7 g/dL — ABNORMAL LOW (ref 13.0–17.0)
MCH: 27.6 pg (ref 26.0–34.0)
MCHC: 32.5 g/dL (ref 30.0–36.0)
MCV: 84.9 fL (ref 78.0–100.0)
Platelets: 220 10*3/uL (ref 150–400)
RBC: 4.24 MIL/uL (ref 4.22–5.81)
RDW: 15.5 % (ref 11.5–15.5)
WBC: 5.1 10*3/uL (ref 4.0–10.5)

## 2015-04-17 LAB — HEPARIN LEVEL (UNFRACTIONATED): HEPARIN UNFRACTIONATED: 0.49 [IU]/mL (ref 0.30–0.70)

## 2015-04-17 MED ORDER — NITROGLYCERIN 0.4 MG SL SUBL: 0.4000 mg | SUBLINGUAL_TABLET | SUBLINGUAL | Status: DC | PRN

## 2015-04-17 MED ORDER — CLOPIDOGREL BISULFATE 75 MG PO TABS: 75.0000 mg | ORAL_TABLET | Freq: Every day | ORAL | Status: DC

## 2015-04-17 MED ORDER — AMLODIPINE BESYLATE 5 MG PO TABS: 5.0000 mg | ORAL_TABLET | Freq: Every day | ORAL | Status: DC

## 2015-04-17 MED ORDER — CARVEDILOL 25 MG PO TABS: 25.0000 mg | ORAL_TABLET | Freq: Two times a day (BID) | ORAL | Status: DC

## 2015-04-17 MED ORDER — ISOSORBIDE MONONITRATE ER 30 MG PO TB24: 30.0000 mg | ORAL_TABLET | Freq: Every day | ORAL | Status: DC

## 2015-04-17 MED ORDER — ATORVASTATIN CALCIUM 80 MG PO TABS: 80.0000 mg | ORAL_TABLET | Freq: Every day | ORAL | Status: DC

## 2015-04-17 MED ORDER — METFORMIN HCL 500 MG PO TABS: 500.0000 mg | ORAL_TABLET | Freq: Two times a day (BID) | ORAL | Status: DC

## 2015-04-17 NOTE — Discharge Summary (Addendum)
Discharge Summary    Patient ID: Darrell [name],  MRN: [id number], DOB/AGE: Aug 30, 1955 60 y.o.  Admit date: 04/15/2015 Discharge date: 04/17/2015  Primary Care Provider: Minerva [name] Primary Cardiologist: Dr Darrell [name]  Discharge Diagnoses    Active Problems:   Elevated troponin   Unstable angina (HCC)   Allergies No Known Allergies  Diagnostic Studies/Procedures    2-view CXR _____________   History of Present Illness     60 yo CA man with HTN. T2DM, CAD with prior PCI (DES to p/d LCX in 07/2006, DES to RCA/RI in 08/2007) and multivessel CABGx5 in 11/2007, STEMI in 2014 (felt to be due to occlusion of SVG-D1). Admitted 04/04/15 to 04/08/15 for NSTEMI and underwent PCI to ostial to mid LCX lesion but distal lesion was left without intervention, admitted 03/19 with chest pressure and SOB for 2 days.  Hospital Course     Consultants: None   Darrell [name]'s enzymes were mildly elevated, consistent with his recent MI. Since his enzymes were much lower than they were on admission and his ECG had no new changes, no ischemic evaluation was indicated.  His BP was elevated and he had not been taking his medication. He was restarted on his medication and Case Management gave him information on how to get help with primary care and medications as an outpatient.   Once his cardiac and BP medications were restarted, his symptoms improved. There were no additional problems.   On 03/20, he was seen by Dr Darrell [name] and all data were reviewed. No further inpatient workup was indicated and he was considered stable for discharge, to follow up as an outpatient. _____________  Discharge Vitals Blood pressure 128/73, pulse 73, temperature 99.2 F (37.3 C), temperature source Oral, resp. rate 16, height '6\' 1"'$  (1.854 m), weight 210 lb 6.4 oz (95.437 kg), SpO2 96 %.  Filed Weights   04/16/15 0457 04/17/15 0651  Weight: 209 lb 14.4 oz (95.21 kg) 210 lb 6.4 oz (95.437 kg)    Labs & Radiologic  Studies    CBC  Recent Labs  04/15/15 2239 04/17/15 0222  WBC 7.2 5.1  NEUTROABS 5.5  --   HGB 13.2 11.7*  HCT 39.6 36.0*  MCV 84.1 84.9  PLT 218 382   Basic Metabolic Panel  Recent Labs  04/15/15 2239 04/16/15 0514  NA 135 139  K 4.5 4.4  CL 101 102  CO2 25 26  GLUCOSE 129* 101*  BUN 19 17  CREATININE 1.16 1.15  CALCIUM 9.0 9.3   Cardiac Enzymes TROPONIN I  Date Value Ref Range Status  04/16/2015 0.18* <0.031 ng/mL Final  04/16/2015 0.29* <0.031 ng/mL Final  04/15/2015 0.11* <0.031 ng/mL Final  04/05/2015 9.51* <0.031 ng/mL Final  04/04/2015 8.01* <0.031 ng/mL Final  04/04/2015 4.02* <0.031 ng/mL Final   Dg Chest 2 View 04/16/2015  CLINICAL DATA:  Acute onset of chest pressure while eating dinner tonight. Recent heart catheterization. EXAM: CHEST  2 VIEW COMPARISON:  04/04/2015 FINDINGS: There is unchanged moderate cardiomegaly. There is prior sternotomy and CABG. The lungs are clear. There is no pleural effusion. The pulmonary vasculature is normal. Hilar and mediastinal contours are unremarkable and unchanged. IMPRESSION: Unchanged cardiomegaly.  No acute cardiopulmonary findings. Electronically Signed   By: Darrell [name] M.D.   On: 04/16/2015 01:50   _____________  Disposition   Pt is being discharged home today in good condition.  Follow-up Plans & Appointments    Follow-up Information    Follow up  with [name], BRYAN, PA-C On 04/25/2015.   Specialties:  Physician Assistant, Radiology, Interventional Cardiology   Why:  See provider for Dr Darrell [name] at 10:30 am, please arrive 15 minutes early for paperwork.   Contact information:   [address] [place] [address] [phone]      Discharge Instructions    Diet - low sodium heart healthy    Complete by:  As directed      Increase activity slowly    Complete by:  As directed            Discharge Medications   Current Discharge Medication List    CONTINUE these medications  which have CHANGED   Details  amLODipine (NORVASC) 5 MG tablet Take 1 tablet (5 mg total) by mouth daily. Qty: 30 tablet, Refills: 6    atorvastatin (LIPITOR) 80 MG tablet Take 1 tablet (80 mg total) by mouth daily. Qty: 30 tablet, Refills: 6    carvedilol (COREG) 25 MG tablet Take 1 tablet (25 mg total) by mouth 2 (two) times daily with a meal. Qty: 60 tablet, Refills: 6    clopidogrel (PLAVIX) 75 MG tablet Take 1 tablet (75 mg total) by mouth daily. Qty: 30 tablet, Refills: 11    isosorbide mononitrate (IMDUR) 30 MG 24 hr tablet Take 1 tablet (30 mg total) by mouth daily. Qty: 30 tablet, Refills: 6    metFORMIN (GLUCOPHAGE) 500 MG tablet Take 1 tablet (500 mg total) by mouth 2 (two) times daily with a meal. Qty: 60 tablet, Refills: 1    nitroGLYCERIN (NITROSTAT) 0.4 MG SL tablet Place 1 tablet (0.4 mg total) under the tongue every 5 (five) minutes as needed for chest pain. Qty: 25 tablet, Refills: 3      CONTINUE these medications which have NOT CHANGED   Details  aspirin EC 81 MG EC tablet Take 1 tablet (81 mg total) by mouth daily.    glucose monitoring kit (FREESTYLE) monitoring kit 1 each by Does not apply route once. Glucometer with test strips and lancets to check blood glucose BID ac breakfast and dinner. Qty: 1 each, Refills: 0      STOP taking these medications     ciprofloxacin (CIPRO) 500 MG tablet           Outstanding Labs/Studies   None  Duration of Discharge Encounter   Greater than 30 minutes including physician time.  Signed, Darrell Ferries NP 04/17/2015, 3:20 PM    Pt seen and examined  Agree with above  PT is stable for discharge    Darrell [name]

## 2015-04-17 NOTE — Telephone Encounter (Signed)
New Message  TCM  04/25/2015 at 10:30a with Leron CroakHager per Bjorn Loserhonda

## 2015-04-17 NOTE — Progress Notes (Signed)
Pt has orders to be discharged. Discharge instructions given and pt has no additional questions at this time. Medication regimen reviewed and pt educated. Pt verbalized understanding and has no additional questions. Telemetry box removed. IV removed and site in good condition. Pt stable and waiting for transportation.   Cashay Manganelli RN 

## 2015-04-17 NOTE — Progress Notes (Signed)
   Subjective: No CP  [place]ing is better Objective: Filed Vitals:   04/16/15 1245 04/16/15 2234 04/17/15 0651 04/17/15 1156  BP: 111/69 128/69 145/85 128/73  Pulse: 79 84 78 73  Temp: 97.9 F (36.6 C) 99 F (37.2 C) 98.5 F (36.9 C) 99.2 F (37.3 C)  TempSrc: Oral Oral Oral Oral  Resp: 18 17  16   Height:      Weight:   210 lb 6.4 oz (95.437 kg)   SpO2: 97% 96% 97% 96%   Weight change: 8 oz (0.227 kg)  Intake/Output Summary (Last 24 hours) at 04/17/15 1201 Last data filed at 04/17/15 0949  Gross per 24 hour  Intake    820 ml  Output   1050 ml  Net   -230 ml    General: Alert, awake, oriented x3, in no acute distress Neck:  JVP is normal Heart: Regular rate and rhythm, without murmurs, rubs, gallops.  Lungs: Clear to auscultation.  No rales or wheezes. Exemities:  No edema.   Neuro: Grossly intact, nonfocal.  Tele:  SR   Lab Results: Results for orders placed or performed during the hospital encounter of 04/15/15 (from the past 24 hour(s))  Glucose, capillary     Status: Abnormal   Collection Time: 04/16/15  3:47 PM  Result Value Ref Range   Glucose-Capillary 143 (H) 65 - 99 mg/dL  Heparin level (unfractionated)     Status: None   Collection Time: 04/16/15  3:51 PM  Result Value Ref Range   Heparin Unfractionated 0.47 0.30 - 0.70 IU/mL  Glucose, capillary     Status: None   Collection Time: 04/16/15 10:33 PM  Result Value Ref Range   Glucose-Capillary 99 65 - 99 mg/dL   Comment 1 Notify RN    Comment 2 Document in Chart   Heparin level (unfractionated)     Status: None   Collection Time: 04/17/15  2:22 AM  Result Value Ref Range   Heparin Unfractionated 0.49 0.30 - 0.70 IU/mL  CBC     Status: Abnormal   Collection Time: 04/17/15  2:22 AM  Result Value Ref Range   WBC 5.1 4.0 - 10.5 K/uL   RBC 4.24 4.22 - 5.81 MIL/uL   Hemoglobin 11.7 (L) 13.0 - 17.0 g/dL   HCT 40.936.0 (L) 81.139.0 - 91.452.0 %   MCV 84.9 78.0 - 100.0 fL   MCH 27.6 26.0 - 34.0 pg   MCHC 32.5 30.0 -  36.0 g/dL   RDW 78.215.5 95.611.5 - 21.315.5 %   Platelets 220 150 - 400 K/uL    Studies/Results: No results found.  Medications: Reviewed   @PROBHOSP @  1  CP  Resolved  Pt with known CAD  NSTMI with PCI LCx earlier this month  Distal leaison left.   Admitted with chest pressure  Had been off of meds   Currently symptom free   Trivial elevation in troponin  I would ambulate  Now back on meds  If feels oK would plan for d/c  Case manager has been working on meds     2 HTN  Improved on meds    3  LOS: 1 day   Dietrich Patesaula Alphonsine Minium 04/17/2015, 12:01 PM

## 2015-04-17 NOTE — Progress Notes (Signed)
Addendums  CM also informed patient to call social services to see if he qualifies for Medicaid. Soc Worker to provide a SCAT application and to give him a list of food pantries in the area. Abelino DerrickB Jacie Tristan [facility] Bone [facility] Joint Surgery CenterRN,MHA,BSN [phone]

## 2015-04-17 NOTE — Progress Notes (Signed)
ANTICOAGULATION CONSULT NOTE - Follow Up Consult  Pharmacy Consult for heparin Indication: chest pain/ACS  No Known Allergies  Patient Measurements: Height: 6\' 1"  (185.4 cm) Weight: 210 lb 6.4 oz (95.437 kg) (a scale) IBW/kg (Calculated) : 79.9 Heparin Dosing Weight: 95 kg  Vital Signs: Temp: 98.5 F (36.9 C) (03/20 0651) Temp Source: Oral (03/20 0651) BP: 145/85 mmHg (03/20 0651) Pulse Rate: 78 (03/20 0651)  Labs:  Recent Labs  04/15/15 2239 04/16/15 0514 04/16/15 0912 04/16/15 1134 04/16/15 1551 04/17/15 0222  HGB 13.2  --   --   --   --  11.7*  HCT 39.6  --   --   --   --  36.0*  PLT 218  --   --   --   --  220  HEPARINUNFRC  --   --  0.72*  --  0.47 0.49  CREATININE 1.16 1.15  --   --   --   --   TROPONINI 0.11* 0.29*  --  0.18*  --   --     Estimated Creatinine Clearance: 79.1 mL/min (by C-G formula based on Cr of 1.15).   Medications:  Infusions:  . heparin 1,350 Units/hr (04/16/15 1642)    Assessment: 60 y/o male admitted 04/15/2015 with chest pain; had recent admit resulting in PCI and was noncompliant with medications prescribed due to inability to afford medications.   He continues on IV heparin with HL therapeutic at 0.49. No bleeding noted, Hb down to 11.7, pltc stable.  Goal of Therapy:  Heparin level 0.3-0.7 units/ml Monitor platelets by anticoagulation protocol: Yes   Plan:  Continue heparin at 1350 units/hr Daily HL and CBC Monitor for s/sx of bleeding   [facility] Physican Surgery CenterJennifer [place], RoundupPharm.D., BCPS Clinical Pharmacist Pager: [phone] 04/17/2015 8:56 AM

## 2015-04-17 NOTE — Progress Notes (Signed)
CM CONSULT  CM talked to patient with spouse present about inability to afford their medication. Patient stated that he receives an SSI check and his spouse is on social security and they barely have enough to pay for food after all of the bills are paid. They receive $223 in food stamps. CM informed patient/ spouse of churches with food pantries when their food gets low. Spouse stated " we do not have a car." IT traineroc Worker referral placed for SCAT - SCAT is the [employer]'s shared-ride transportation service for eligible riders who have a disability that prevents them from riding the fixed route bus. Patient has private insurance with Medicare part A&B, no prescription drug plan; pharmacy of choice is [facility]. No PCP. CM talked to pt/ [facility] and also they can get their prescriptions filled there. They have and a program to help assist him with his medication. Apt to be made at discharge and pt can get his prescriptions filled there. ( [facility], CM informed spouse to call for an apt or to go with her spouse and talk to them for an apt.) Patient has a walker at home and a son that helps them at times. CM will continue to follow for DCP; B Shelba FlakeChandler RN,MHA,BSN [phone]

## 2015-04-17 NOTE — Telephone Encounter (Signed)
Discharged 04/17/15  [facility]

## 2015-04-18 MED FILL — NITROSTAT 0.4 MG TABLET SL: 0.4 | 25 days supply | Qty: 25 | Fill #0

## 2015-04-18 MED FILL — ATORVASTATIN 80 MG TABLET: 80 | 30 days supply | Qty: 30 | Fill #0

## 2015-04-18 MED FILL — ISOSORBIDE MN ER 30 MG TAB: 30 | 30 days supply | Qty: 30 | Fill #0

## 2015-04-18 MED FILL — CARVEDILOL 25 MG TABLET: 25 | 30 days supply | Qty: 60 | Fill #0

## 2015-04-18 MED FILL — CLOPIDOGREL 75 MG TABLET: 75 | 30 days supply | Qty: 30 | Fill #0

## 2015-04-18 MED FILL — metFORMIN HCL 500 MG TABS: 500 | 30 days supply | Qty: 60 | Fill #0

## 2015-04-18 MED FILL — AMLODIPINE BESYLATE 5 MG TA: 5 | 30 days supply | Qty: 30 | Fill #0

## 2015-04-18 NOTE — Telephone Encounter (Signed)
Spoke with patients daughter, that is the number listed on the chart Did ask daughter for patients number but it was in her phone and she was unable to view it She stated she had spoken to her dad and he was doing well Asked that she have him call the office but she requested that we call back Thursday afternoon and she would be with him and could talk to him then

## 2015-04-18 NOTE — Telephone Encounter (Signed)
Tried to call patient to follow up on recent hospital stay Phone did ring and went to vm but unable to leave message Will try again

## 2015-04-20 NOTE — Telephone Encounter (Signed)
Spoke with patients daughter earlier and she asked that I call her back after 5:15 she would be with her dad then and I could speak with him Called number listed, no answer

## 2015-04-21 ENCOUNTER — Encounter: Payer: Medicare Other | Admitting: Physician Assistant

## 2015-04-21 NOTE — Telephone Encounter (Signed)
Unable to reach patient on daughters phone

## 2015-04-21 NOTE — Progress Notes (Signed)
    Cardiology Office Note   Date:  04/21/2015   ID:  Manus GunningSteven S Quiocho, DOB 09-23-1955, MRN [id number]  PCP:  No primary care provider on file.  Cardiologist:  Dr Steward RosHochrein  Barrett, Bjorn Loserhonda, PA-C   No chief complaint on file.   History of Present Illness: Manus GunningSteven S Fisher is a 60 y.o. male with a history of HTN. T2DM, CAD with prior PCI (DES to p/d LCX in 07/2006, DES to RCA/RI in 08/2007) and CABG x 5 in 11/2007, STEMI in 2014 (felt to be due to occlusion of SVG-D1).   Admitted 04/04/15 to 04/08/15 for NSTEMI and underwent PCI to ostial to mid LCX lesion but distal lesion was left without intervention.   Admitted 03/19 with chest pressure and SOB for 2 days. He was restarted on his BP medications and DAPT.   This encounter was created in error - please disregard.

## 2015-04-25 ENCOUNTER — Inpatient Hospital Stay: Payer: Medicare Other | Admitting: Internal Medicine

## 2015-04-25 ENCOUNTER — Ambulatory Visit: Payer: Medicare Other | Admitting: Physician Assistant

## 2015-04-27 ENCOUNTER — Encounter: Payer: Self-pay | Admitting: *Deleted

## 2015-05-23 ENCOUNTER — Telehealth (HOSPITAL_COMMUNITY): Payer: Self-pay | Admitting: Cardiac Rehabilitation

## 2015-05-23 NOTE — Telephone Encounter (Signed)
pc to pt to discuss enrolling in cardiac rehab. Left message on answering machine.  This is third phone attempt, letter mailed to pt home.

## 2015-12-22 ENCOUNTER — Emergency Department (HOSPITAL_COMMUNITY): Payer: Medicare Other

## 2015-12-22 ENCOUNTER — Inpatient Hospital Stay (HOSPITAL_COMMUNITY)
Admission: EM | Admit: 2015-12-22 | Discharge: 2015-12-24 | DRG: 281 | Disposition: A | Discharge status: Home / Self Care | Payer: Medicare Other | Attending: Cardiology | Admitting: Cardiology

## 2015-12-22 ENCOUNTER — Encounter (HOSPITAL_COMMUNITY): Payer: Self-pay

## 2015-12-22 DIAGNOSIS — I10 Essential (primary) hypertension: Secondary | ICD-10-CM | POA: Diagnosis present

## 2015-12-22 DIAGNOSIS — R778 Other specified abnormalities of plasma proteins: Secondary | ICD-10-CM | POA: Diagnosis present

## 2015-12-22 DIAGNOSIS — I25110 Atherosclerotic heart disease of native coronary artery with unstable angina pectoris: Secondary | ICD-10-CM | POA: Diagnosis not present

## 2015-12-22 DIAGNOSIS — Z7984 Long term (current) use of oral hypoglycemic drugs: Secondary | ICD-10-CM

## 2015-12-22 DIAGNOSIS — Z79899 Other long term (current) drug therapy: Secondary | ICD-10-CM

## 2015-12-22 DIAGNOSIS — E1151 Type 2 diabetes mellitus with diabetic peripheral angiopathy without gangrene: Secondary | ICD-10-CM | POA: Diagnosis not present

## 2015-12-22 DIAGNOSIS — Z951 Presence of aortocoronary bypass graft: Secondary | ICD-10-CM | POA: Diagnosis not present

## 2015-12-22 DIAGNOSIS — Z7982 Long term (current) use of aspirin: Secondary | ICD-10-CM

## 2015-12-22 DIAGNOSIS — R079 Chest pain, unspecified: Secondary | ICD-10-CM | POA: Diagnosis not present

## 2015-12-22 DIAGNOSIS — I252 Old myocardial infarction: Secondary | ICD-10-CM

## 2015-12-22 DIAGNOSIS — R0789 Other chest pain: Secondary | ICD-10-CM | POA: Diagnosis not present

## 2015-12-22 DIAGNOSIS — I2510 Atherosclerotic heart disease of native coronary artery without angina pectoris: Secondary | ICD-10-CM | POA: Diagnosis present

## 2015-12-22 DIAGNOSIS — I160 Hypertensive urgency: Secondary | ICD-10-CM | POA: Diagnosis present

## 2015-12-22 DIAGNOSIS — R748 Abnormal levels of other serum enzymes: Secondary | ICD-10-CM

## 2015-12-22 DIAGNOSIS — E871 Hypo-osmolality and hyponatremia: Secondary | ICD-10-CM | POA: Diagnosis not present

## 2015-12-22 DIAGNOSIS — I214 Non-ST elevation (NSTEMI) myocardial infarction: Secondary | ICD-10-CM | POA: Diagnosis not present

## 2015-12-22 DIAGNOSIS — E7800 Pure hypercholesterolemia, unspecified: Secondary | ICD-10-CM | POA: Diagnosis present

## 2015-12-22 DIAGNOSIS — K219 Gastro-esophageal reflux disease without esophagitis: Secondary | ICD-10-CM | POA: Diagnosis present

## 2015-12-22 DIAGNOSIS — I200 Unstable angina: Secondary | ICD-10-CM | POA: Diagnosis present

## 2015-12-22 DIAGNOSIS — Z9114 Patient's other noncompliance with medication regimen: Secondary | ICD-10-CM

## 2015-12-22 DIAGNOSIS — Z7902 Long term (current) use of antithrombotics/antiplatelets: Secondary | ICD-10-CM

## 2015-12-22 DIAGNOSIS — Z91199 Patient's noncompliance with other medical treatment and regimen due to unspecified reason: Secondary | ICD-10-CM

## 2015-12-22 DIAGNOSIS — E119 Type 2 diabetes mellitus without complications: Secondary | ICD-10-CM

## 2015-12-22 DIAGNOSIS — R7989 Other specified abnormal findings of blood chemistry: Secondary | ICD-10-CM | POA: Diagnosis not present

## 2015-12-22 DIAGNOSIS — Z841 Family history of disorders of kidney and ureter: Secondary | ICD-10-CM

## 2015-12-22 DIAGNOSIS — Z955 Presence of coronary angioplasty implant and graft: Secondary | ICD-10-CM

## 2015-12-22 DIAGNOSIS — F17210 Nicotine dependence, cigarettes, uncomplicated: Secondary | ICD-10-CM | POA: Diagnosis present

## 2015-12-22 DIAGNOSIS — I739 Peripheral vascular disease, unspecified: Secondary | ICD-10-CM | POA: Diagnosis present

## 2015-12-22 DIAGNOSIS — Z9119 Patient's noncompliance with other medical treatment and regimen: Secondary | ICD-10-CM

## 2015-12-22 DIAGNOSIS — Z8249 Family history of ischemic heart disease and other diseases of the circulatory system: Secondary | ICD-10-CM

## 2015-12-22 LAB — CBC
HEMATOCRIT: 41.7 % (ref 39.0–52.0)
HEMOGLOBIN: 14.4 g/dL (ref 13.0–17.0)
MCH: 28.9 pg (ref 26.0–34.0)
MCHC: 34.5 g/dL (ref 30.0–36.0)
MCV: 83.7 fL (ref 78.0–100.0)
Platelets: 210 10*3/uL (ref 150–400)
RBC: 4.98 MIL/uL (ref 4.22–5.81)
RDW: 14.4 % (ref 11.5–15.5)
WBC: 10.6 10*3/uL — AB (ref 4.0–10.5)

## 2015-12-22 LAB — BASIC METABOLIC PANEL
ANION GAP: 8 (ref 5–15)
BUN: 23 mg/dL — ABNORMAL HIGH (ref 6–20)
CO2: 23 mmol/L (ref 22–32)
Calcium: 8.6 mg/dL — ABNORMAL LOW (ref 8.9–10.3)
Chloride: 99 mmol/L — ABNORMAL LOW (ref 101–111)
Creatinine, Ser: 1.17 mg/dL (ref 0.61–1.24)
GFR calc Af Amer: >60 mL/min (ref >60)
GLUCOSE: 113 mg/dL — AB (ref 65–99)
POTASSIUM: 5 mmol/L (ref 3.5–5.1)
SODIUM: 130 mmol/L — AB (ref 135–145)

## 2015-12-22 LAB — I-STAT TROPONIN, ED: Troponin i, poc: 0.36 ng/mL (ref 0.00–0.08)

## 2015-12-22 LAB — GLUCOSE, CAPILLARY
GLUCOSE-CAPILLARY: 103 mg/dL — AB (ref 65–99)
Glucose-Capillary: 165 mg/dL — ABNORMAL HIGH (ref 65–99)

## 2015-12-22 LAB — TROPONIN I: Troponin I: 1.43 ng/mL (ref <0.03)

## 2015-12-22 LAB — HEPARIN LEVEL (UNFRACTIONATED): HEPARIN UNFRACTIONATED: 0.33 [IU]/mL (ref 0.30–0.70)

## 2015-12-22 MED ORDER — CARVEDILOL 12.5 MG PO TABS: 25.0000 mg | ORAL_TABLET | Freq: Two times a day (BID) | ORAL | Status: DC

## 2015-12-22 MED ORDER — CARVEDILOL 12.5 MG PO TABS
25.0000 mg | ORAL_TABLET | Freq: Once | ORAL | Status: AC
  Administered 2015-12-22: 25 mg via ORAL
  Filled 2015-12-22: qty 2

## 2015-12-22 MED ORDER — CLOPIDOGREL BISULFATE 75 MG PO TABS
75.0000 mg | ORAL_TABLET | Freq: Every day | ORAL | Status: DC
Start: 2015-12-22 — End: 2015-12-24
  Administered 2015-12-22 – 2015-12-24 (×3): 75 mg via ORAL
  Filled 2015-12-22 (×3): qty 1

## 2015-12-22 MED ORDER — AMLODIPINE BESYLATE 5 MG PO TABS
10.0000 mg | ORAL_TABLET | Freq: Once | ORAL | Status: AC
  Administered 2015-12-22: 10 mg via ORAL
  Filled 2015-12-22: qty 2

## 2015-12-22 MED ORDER — ONDANSETRON HCL 4 MG/2ML IJ SOLN: 4.0000 mg | Freq: Four times a day (QID) | INTRAMUSCULAR | Status: DC | PRN

## 2015-12-22 MED ORDER — NITROGLYCERIN 0.4 MG SL SUBL: 0.4000 mg | SUBLINGUAL_TABLET | SUBLINGUAL | Status: DC | PRN

## 2015-12-22 MED ORDER — ACETAMINOPHEN 325 MG PO TABS: 650.0000 mg | ORAL_TABLET | ORAL | Status: DC | PRN

## 2015-12-22 MED ORDER — HEPARIN BOLUS VIA INFUSION
4000.0000 [IU] | Freq: Once | INTRAVENOUS | Status: AC
  Administered 2015-12-22: 4000 [IU] via INTRAVENOUS
  Filled 2015-12-22: qty 4000

## 2015-12-22 MED ORDER — AMLODIPINE BESYLATE 10 MG PO TABS
10.0000 mg | ORAL_TABLET | Freq: Every day | ORAL | Status: DC
  Administered 2015-12-23 – 2015-12-24 (×2): 10 mg via ORAL
  Filled 2015-12-22 (×2): qty 1

## 2015-12-22 MED ORDER — HEPARIN (PORCINE) IN NACL 100-0.45 UNIT/ML-% IJ SOLN
1400.0000 [IU]/h | INTRAMUSCULAR | Status: DC
  Administered 2015-12-22 – 2015-12-23 (×3): 1250 [IU]/h via INTRAVENOUS
  Filled 2015-12-22 (×3): qty 250

## 2015-12-22 MED ORDER — ISOSORBIDE MONONITRATE ER 30 MG PO TB24
30.0000 mg | ORAL_TABLET | Freq: Every day | ORAL | Status: DC
  Administered 2015-12-23 – 2015-12-24 (×2): 30 mg via ORAL
  Filled 2015-12-22 (×2): qty 1

## 2015-12-22 MED ORDER — ASPIRIN EC 81 MG PO TBEC
81.0000 mg | DELAYED_RELEASE_TABLET | Freq: Every day | ORAL | Status: DC
  Administered 2015-12-23 – 2015-12-24 (×2): 81 mg via ORAL
  Filled 2015-12-22 (×2): qty 1

## 2015-12-22 MED ORDER — CARVEDILOL 25 MG PO TABS
25.0000 mg | ORAL_TABLET | Freq: Two times a day (BID) | ORAL | Status: DC
  Administered 2015-12-22 – 2015-12-24 (×4): 25 mg via ORAL
  Filled 2015-12-22 (×4): qty 1

## 2015-12-22 MED ORDER — ATORVASTATIN CALCIUM 80 MG PO TABS
80.0000 mg | ORAL_TABLET | Freq: Every day | ORAL | Status: DC
  Administered 2015-12-23: 80 mg via ORAL
  Filled 2015-12-22: qty 1

## 2015-12-22 MED ORDER — INSULIN ASPART 100 UNIT/ML ~~LOC~~ SOLN
0.0000 [IU] | Freq: Three times a day (TID) | SUBCUTANEOUS | Status: DC
  Administered 2015-12-23: 5 [IU] via SUBCUTANEOUS

## 2015-12-22 NOTE — H&P (Signed)
History & Physical    Patient ID: Darrell [name] MRN: [id number], DOB/AGE: 1955/10/10   Admit date: 12/22/2015   Primary Physician: No PCP Per Patient Primary Cardiologist: Dr. Percival Spanish - Never Seen in the Outpatient Setting   History of Present Illness    Darrell [name] is a 60 y.o. male with past medical history of CAD (s/p CABG in 11/2007 w/ LIMA-LAD, SVG-D1, LRA-OM1, SVG-PDA, known occlusion of SVG-D1 and occlusion of SVG-PDA-PLA, and recent DES to mid-LCx in 03/2015), HTN, HLD, and Type 2 DM who presents to [facility] ED on 12/22/2015 for evaluation of chest pain.   The patient reports he developed sudden shooting chest pain this morning. Denies any associated dyspnea, diaphoresis, nausea, or vomiting. Not worse with exertion or food consumption. Partially relieved with SL NTG en route to the hospital via EMS. He reports the pain has subsided at that time.   He reports not taking any medications for the past month, including his Amlodipine 17m daily, Atorvastatin 886mdaily, Coreg 2516mID, Plavix 69m38mily, Imdur 30mg22mly, and Metformin 500mg 70m Has been taking ASA 81mg d59m. Says he ran out of the medications and did not have the number to contact our office. Says he does not have a PCP. In reviewing records, he cancelled both outpatient follow-up appointments earlier this year.   Upon arrival to the ED, initial BP was elevated to 206/126. He was given Amlodipine 10mg da56mand Coreg 25mg wit32m now improved to 143/76.  Initial labs today show a WBC of 10.6, Hgb 14.4, and platelets 210. Na+ 130, K+ 5.0, and creatinine 1.17. Troponin at 0.36. CXR showing mildly enlarged cardiac silhouette with no acute cardiopulmonary disease. EKG shows NSR, HR 97, with no acute ST or T-wave changes. TWI is actually improved when compared to previous tracings.  His last cardiac catheterization was in 03/2015 in the setting of an NSTEMI. Troponin values peaked at 9.51. Cath at that time  showed known 3-vessel CAD with patent LRA-OM1 and patent LIMA-mid LAD with diffuse disease along distal LAD. Occluded SVG-PDA-PLA was noted. He did have high grade in-stent restenosis within the proximal codominant circumflex with 2 DES being placed at that time. There was 95% stenosis in the distal Cx but this was not treated at that time due to the small size of the distal vessel and prolonged contrast use of 240mL due 73mhe complexity of his PCI to pCx.   Past Medical History    Past Medical History:  Diagnosis Date  . Claudication (HCC)   . CTioganary artery disease    a. 08/2007 s/p DES to RCA/RI;  b. 07/2006 s/p DES to p/d LCX;  c. 11/2007 CABGx5 (LIMA->LAD, VG->D1, LRA->OM1, VG->PDA->LPL;  d. 09/2012 STEMI/Cath: LM nl, LAD 100p, LCX 70p/90d, OM1 95, LPL 90d, RCA 100, LIMA->LAD nl (LAD 95d), RA->OM nl, VG->PDA->LPL 100, unable to locate VG->Diag, EF 60-65%->Med Rx. e. 03/2015: DES to mid-Cx in the setting of NSTEMI  . Diabetes mellitus without complication (HCC)    a.[place] 7.0 09/2012.  . HyperlipMarland Kitchendemia   . Hypertension   . Noncompliance   . NSTEMI (non-ST elevated myocardial infarction) (HCC) 3/7/2[place]   DES x 2 CFX    Past Surgical History:  Procedure Laterality Date  . CARDIAC CATHETERIZATION N/A 04/06/2015   Procedure: Left Heart Cath and Cors/Grafts Angiography;  Surgeon: Jonathan JLorretta Harpation: [facility] IN[facility]IVPearl RiverService: Cardiovascular;  Laterality: N/A;  . CARDIAC CATHETERIZATION N/A 04/06/2015  Procedure: Coronary Stent Intervention;  Surgeon: Lorretta Harp, MD;  2.5 mm x 12 mm long Synergy DES followed by  2.5 mm x 16 mm long Synergy DES    . CORONARY ARTERY BYPASS GRAFT     2009 LIMA to LAD, SVG to Diag, SVG to PDA and PL, left radial to OM  . LEFT HEART CATH N/A 10/24/2012   Procedure: LEFT HEART CATH;  Surgeon: Troy Sine, MD;  Location: [facility] [place] [facility] LAB;  Service: Cardiovascular;  Laterality: N/A;     Allergies  No Known Allergies   Home Medications    Prior  to Admission medications   Medication Sig Start Date End Date Taking? Authorizing Provider  amLODipine (NORVASC) 5 MG tablet Take 1 tablet (5 mg total) by mouth daily. 04/17/15   Evelene Croon Barrett, PA-C  aspirin EC 81 MG EC tablet Take 1 tablet (81 mg total) by mouth daily. 10/28/12   Rogelia Mire, NP  atorvastatin (LIPITOR) 80 MG tablet Take 1 tablet (80 mg total) by mouth daily. 04/17/15   Rhonda G Barrett, PA-C  carvedilol (COREG) 25 MG tablet Take 1 tablet (25 mg total) by mouth 2 (two) times daily with a meal. 04/17/15   Rhonda G Barrett, PA-C  clopidogrel (PLAVIX) 75 MG tablet Take 1 tablet (75 mg total) by mouth daily. 04/17/15   Rhonda G Barrett, PA-C  glucose monitoring kit (FREESTYLE) monitoring kit 1 each by Does not apply route once. Glucometer with test strips and lancets to check blood glucose BID ac breakfast and dinner. 10/28/12   Rogelia Mire, NP  isosorbide mononitrate (IMDUR) 30 MG 24 hr tablet Take 1 tablet (30 mg total) by mouth daily. 04/17/15   Rhonda G Barrett, PA-C  metFORMIN (GLUCOPHAGE) 500 MG tablet Take 1 tablet (500 mg total) by mouth 2 (two) times daily with a meal. 04/17/15   Rhonda G Barrett, PA-C  nitroGLYCERIN (NITROSTAT) 0.4 MG SL tablet Place 1 tablet (0.4 mg total) under the tongue every 5 (five) minutes as needed for chest pain. 04/17/15   Evelene Croon Barrett, PA-C    Family History    Family History  Problem Relation Age of Onset  . Heart failure Mother   . Cancer Mother   . CAD Mother 79  . Heart failure Father   . Kidney failure Brother     Social History    Social History   Social History  . Marital status: Married    Spouse name: N/A  . Number of children: 3  . Years of education: N/A   Occupational History  . Not on file.   Social History Main Topics  . Smoking status: Current Some Day Smoker    Last attempt to quit: 10/25/2010  . Smokeless tobacco: Never Used  . Alcohol use Yes     Comment: sts he drank for the first time in a  long time last night   . Drug use: No  . Sexual activity: Not on file   Other Topics Concern  . Not on file   Social History Narrative   Lives at home with wife.       Review of Systems    General:  No chills, fever, night sweats or weight changes.  Cardiovascular:  No dyspnea on exertion, edema, orthopnea, palpitations, paroxysmal nocturnal dyspnea. Positive for chest pain.  Dermatological: No rash, lesions/masses Respiratory: No cough, dyspnea Urologic: No hematuria, dysuria Abdominal:   No nausea, vomiting, diarrhea, bright red blood per rectum,  melena, or hematemesis Neurologic:  No visual changes, wkns, changes in mental status. All other systems reviewed and are otherwise negative except as noted above.  Physical Exam    Blood pressure 137/78, pulse 65, resp. rate 22, height 6' 1" (1.854 m), weight 235 lb (106.6 kg), SpO2 97 %.  General: Well developed, well nourished,male appearing in no acute distress. Head: Normocephalic, atraumatic, sclera non-icteric, no xanthomas, nares are without discharge. Dentition:  Neck: No carotid bruits. JVD not elevated.  Lungs: Respirations regular and unlabored, without wheezes or rales.  Heart: Regular rate and rhythm. No S3 or S4.  No murmur, no rubs, or gallops appreciated. Abdomen: Soft, non-tender, non-distended with normoactive bowel sounds. No hepatomegaly. No rebound/guarding. No obvious abdominal masses. Msk:  Strength and tone appear normal for age. No joint deformities or effusions. Extremities: No clubbing or cyanosis. No edema.  Distal pedal pulses are 2+ bilaterally. Neuro: Alert and oriented X 3. Moves all extremities spontaneously. No focal deficits noted. Psych:  Responds to questions appropriately with a normal affect. Skin: No rashes or lesions noted  Labs    Troponin (Point of Care Test)  Recent Labs  12/22/15 1456  TROPIPOC 0.36*   No results for input(s): CKTOTAL, CKMB, TROPONINI in the last 72 hours. Lab  Results  Component Value Date   WBC 10.6 (H) 12/22/2015   HGB 14.4 12/22/2015   HCT 41.7 12/22/2015   MCV 83.7 12/22/2015   PLT 210 12/22/2015     Recent Labs Lab 12/22/15 1320  NA 130*  K 5.0  CL 99*  CO2 23  BUN 23*  CREATININE 1.17  CALCIUM 8.6*  GLUCOSE 113*   Lab Results  Component Value Date   CHOL 154 04/05/2015   HDL 32 (L) 04/05/2015   LDLCALC 106 (H) 04/05/2015   TRIG 79 04/05/2015   Lab Results  Component Value Date   DDIMER (H) 12/01/2007    4.12        AT THE INHOUSE ESTABLISHED CUTOFF VALUE OF 0.48 ug/mL FEU, THIS ASSAY HAS BEEN DOCUMENTED IN THE LITERATURE TO HAVE    No results found for: BNP Pro B Natriuretic peptide (BNP)  Date/Time Value Ref Range Status  10/24/2012 03:30 PM 438.4 (H) 0 - 125 pg/mL Final   No results for input(s): INR in the last 72 hours.    Radiology Studies    Dg Chest 2 View  Result Date: 12/22/2015 CLINICAL DATA:  Midsternal chest pain. EXAM: CHEST  2 VIEW COMPARISON:  04/16/2015 FINDINGS: Postsurgical changes from CABG is stable. Cardiomediastinal silhouette is mildly enlarged. Mediastinal contours appear intact. There is no evidence of focal airspace consolidation, pleural effusion or pneumothorax. Osseous structures are without acute abnormality. Soft tissues are grossly normal. IMPRESSION: Mildly enlarged cardiac silhouette, otherwise no active cardiopulmonary disease. Electronically Signed   By: Fidela Salisbury M.D.   On: 12/22/2015 14:04    EKG & Cardiac Imaging    EKG: NSR, HR 97, with no acute ST or T-wave changes. TWI is actually improved when compared to previous tracings.  ECHOCARDIOGRAM: 04/06/2015 Study Conclusions  - Procedure narrative: Transthoracic echocardiography. Image   quality was suboptimal. The study was technically difficult, as a   result of poor sound wave transmission and body habitus.   Intravenous contrast (Definity) was administered. - Left ventricle: The cavity size was  normal. There was severe   concentric hypertrophy. Systolic function was normal. The   estimated ejection fraction was in the range of 50% to 55%. Mild  hypokinesis of the inferior myocardium. Doppler parameters are   consistent with abnormal left ventricular relaxation (grade 1   diastolic dysfunction). - Aortic valve: Transvalvular velocity was within the normal range.   There was no stenosis. There was no regurgitation. - Mitral valve: There was no regurgitation. - Left atrium: The atrium was severely dilated. - Right ventricle: The cavity size was normal. Wall thickness was   normal. - Right atrium: The atrium was mildly dilated. - Tricuspid valve: There was no regurgitation. - Inferior vena cava: The vessel was normal in size.   Cardiac Catheterization: 03/2015  Ost LAD to Mid LAD lesion, 100% stenosed. The lesion was previously treated with a stent (unknown type).  Mid RCA lesion, 100% stenosed. The lesion was previously treated with a stent (unknown type).  Dist Cx-1 lesion, 95% stenosed. The lesion was previously treated with a stent (unknown type).  Dist Cx-2 lesion, 95% stenosed.  Origin to Prox Graft lesion, 100% stenosed.  Dist LAD lesion, 95% stenosed.  Ost Cx to Mid Cx lesion, 95% stenosed. Post intervention, there is a 0% residual stenosis. The lesion was previously treated with a stent (unknown type).  There is mild left ventricular systolic dysfunction.  IMPRESSION:Mr. Girton has a patient radial graft to an obtuse marginal branch as a patent LIMA to the mid LAD involving the distal LAD is diffusely diseased throughout its entirety. There are left-to-right collaterals. The native right is occluded as is the graft to the PDA and PLA. He does have high grade "in-stent restenosis" within the proximal codominant circumflex which may be his culprit lesion although he has distal disease as well. I was able to stent the proximal circumflex however this was a  technically challenging the long procedure using 240 mL of contrast. I elected not to intervene on the distal circumflex at this time but rather treat him medically to assess efficacy of revascularization. The patient remained hemodynamically and electrically stable throughout the procedure. The guidewire and catheter were removed. Sheath was secured. The patient was on IV nitroglycerin and his Angiomax will be continued for 4 additional hours. The sheath will be removed after and pressure held. The patient will be medically for his residual CAD.  Assessment & Plan    1. Hypertensive Urgency - Upon arrival to the ED, initial BP was elevated to 206/126. Has not taken his Amlodipine 61m daily, Coreg 251mBID, or Imdur 3045maily for the past month.  - he has been given Amlodipine 70m45md Coreg 25mg34mh BP now improved to 143/76. - will restart PTA medications. Titrate Amlodipine to 70mg 81my.    2. CAD/ Chest Pain - has known CAD, s/p CABG in 11/2007 w/ LIMA-LAD, SVG-D1, LRA-OM1, SVG-PDA-PL, known occlusion of SVG-D1 and occluded SVG-PDA-PLA, and recent DES to mid-LCx in 03/2015. Cath in 03/2015 showed high grade in-stent restenosis within the proximal codominant circumflex with 2 DES being placed at that time. There was 95% stenosis in the distal Cx but this was not treated at that time due to the small size of the distal vessel and prolonged contrast use of 240mL d87mo the complexity of his PCI to pCx.  - he reports developing a shooting chest pain this AM which was not worse with exertion or positional changes. Denies any associated dyspnea, diaphoresis, nausea, or vomiting. Overall sounds atypical in etiology but troponin is mildly elevated, however his EKG actually looks improved when compared to tracings in 03/2015. Cycle enzymes as below. If values remain flat, would  not pursue a repeat cath this admission.  - has not taken his Plavix in over 1 month.  - will restart ASA, Plavix, statin, Imdur,  and BB.   3. Elevated Troponin - initial troponin elevated to 0.36. His chest discomfort on admission sounds atypical for a cardiac etiology, however he has been off DAPT for over 1 month with recent DESx2 placement in 03/2015. If troponin elevation remains flat, would think this is most consistent with demand ischemia in the setting of hypertensive urgency. EKG is without acute ischemic changes.  - continue Heparin for now.   4. HLD - LDL at 106 in 03/2015. Goal LDL < 70. - will restart Atorvastatin 57m daily.   5. Type 2 DM - on Metformin PTA but has not taken this for over 1 month.  - SSI while admitted.  6. Medical Noncompliance - has not taken his medications in over 1 month. Cancelled or no-showed for all follow-up appointments. The importance of both of these was addressed with the patient. Says he did not have trouble affording his medications, but that he ran out of refills. - does not have a PCP. Will consult Case Management to assist with establishing a PCP for the patient.   7. Hyponatremia - Na+ 130 on admission. Will initiate fluid restriction. - Repeat BMET in AM.   8. Tobacco Use - still smoking 1ppd - cessation advised.    Signed, BErma Heritage PA-C 12/22/2015, 5:05 PM Pager: [phone] History and all data above reviewed.  Patient examined.  I agree with the findings as above. Patient presents with chest pain and mild trop elevation.  Pain started this morning and he is now pain free.   No acute EKG changes.   The patient exam reveals COR:RRR  ,  Lungs: Clear  ,  Abd: Positive bowel sounds, no rebound no guarding, Ext No edema  .  All available labs, radiology testing, previous records reviewed. Agree with documented assessment and plan. Unstable angina:  I have reviewed the films from earlier this year and the CABG report from 2009.  Difficult anatomy to understand.  He was a difficult intervention for Dr. BGwenlyn Foundrecently on a diffusely disease circ.  He  has diffuse native vessel disease and it looks like 3 grafts are occluded.  I think he needs medical management unless he has recurrent pain.  He has been non compliant with meds and with follow up.  Restart his previous meds.   Continue heparin tonight.    JJeneen RinksHochrein  5:35 PM  12/22/2015

## 2015-12-22 NOTE — Progress Notes (Signed)
ANTICOAGULATION CONSULT NOTE - Follow Up Consult  Pharmacy Consult for Heparin Indication: chest pain/ACS  No Known Allergies  Patient Measurements: Height: 6\' 1"  (185.4 cm) Weight: 235 lb (106.6 kg) IBW/kg (Calculated) : 79.9 Heparin Dosing Weight: 101.9kg  Vital Signs: Temp: 98.1 F (36.7 C) (11/24 1841) Temp Source: Oral (11/24 1841) BP: 156/86 (11/24 1800) Pulse Rate: 74 (11/24 1800)  Labs:  Recent Labs  12/22/15 1320 12/22/15 1930 12/22/15 2137  HGB 14.4  --   --   HCT 41.7  --   --   PLT 210  --   --   HEPARINUNFRC  --   --  0.33  CREATININE 1.17  --   --   TROPONINI  --  1.43*  --     Estimated Creatinine Clearance: 87.1 mL/min (by C-G formula based on SCr of 1.17 mg/dL).   Medications:  Heparin @ 1250 units/hr  Assessment: 60yom started on heparin earlier today for chest pain with elevated troponin. Initial heparin level is therapeutic at 0.33. Plan per cardiology is for medical management unless he has recurrent pain. No bleeding reported.  Goal of Therapy:  Heparin level 0.3-0.7 units/ml Monitor platelets by anticoagulation protocol: Yes   Plan:  1) Continue heparin at 1250 units/hr 2) Daily heparin level and CBC  Fredrik RiggerMarkle, Jaliel Deavers Sue 12/22/2015,10:18 PM

## 2015-12-22 NOTE — ED Notes (Signed)
Report given.

## 2015-12-22 NOTE — ED Notes (Signed)
Attempted report to 3 W. 

## 2015-12-22 NOTE — ED Notes (Signed)
Pt. Given happy meal and a diet sprite

## 2015-12-22 NOTE — ED Triage Notes (Signed)
CP in center chest that started this AM around 1030. Pain resolved with 2 nitro given by EMS. HY of MI, CABG. Hy of hypertension and sts he has been out of his medication for a month. N/V with EMS received 4 zofran with EMS. Nose bleed present when EMS arrived

## 2015-12-22 NOTE — ED Provider Notes (Signed)
[place] DEPT Provider Note   CSN: [id number] Arrival date & time: 12/22/15  1312  History   Chief Complaint Chief Complaint  Patient presents with  . Chest Pain    HPI Darrell [name] is a 60 y.o. male.  HPI  60 y.o. male with a hx of HTN, T2DM, CAD, PCI (DES to p/d LCX in 07/2006, DES to RCA/RI in 08/2007) and multivessel CABGx5 in 11/2007, STEMI in 2014 (felt to be due to occlusion of SVG-D1), presents to the Emergency Department today complaining of chest pain. Noted this AM while sitting and watching TV on the cough. Notes pain was central and a pressure sensation. No radiation. No diaphoresis. No N/V initially. States it feels like MI in the past. No fevers. Notes called EMS and given 2 NTG with relief of symptoms. ASA given. No active chest pain currently. Admitted on 03/19 due to NSTEMI. Cath report below. Pt also notes running out of BP medications as well as Diabetes medications. Notes non compliance. No follow up with PCP as he does not have one.   Cardiologist- Dr Percival Spanish  Cardiac Catheterization 04/06/15  Ost LAD to Mid LAD lesion, 100% stenosed. The lesion was previously treated with a stent (unknown type).  Mid RCA lesion, 100% stenosed. The lesion was previously treated with a stent (unknown type).  Dist Cx-1 lesion, 95% stenosed. The lesion was previously treated with a stent (unknown type).  Dist Cx-2 lesion, 95% stenosed.  Origin to Prox Graft lesion, 100% stenosed.  Dist LAD lesion, 95% stenosed.  Ost Cx to Mid Cx lesion, 95% stenosed. Post intervention, there is a 0% residual stenosis. The lesion was previously treated with a stent (unknown type).  There is mild left ventricular systolic dysfunction.  Past Medical History:  Diagnosis Date  . Claudication ([place])   . Coronary artery disease    a. 08/2007 s/p DES to RCA/RI;  b. 07/2006 s/p DES to p/d LCX;  c. 11/2007 CABGx5 (LIMA->LAD, VG->D1, LRA->OM1, VG->PDA->LPL;  d. 09/2012 STEMI/Cath: LM nl, LAD  100p, LCX 70p/90d, OM1 95, LPL 90d, RCA 100, LIMA->LAD nl (LAD 95d), RA->OM nl, VG->PDA->LPL 100, unable to locate VG->Diag, EF 60-65%->Med Rx.  . Diabetes mellitus without complication ([place])    a. A1c 7.0 09/2012.  Marland Kitchen Hyperlipidemia   . Hypertension   . Noncompliance   . NSTEMI (non-ST elevated myocardial infarction) ([place]) 04/04/2015   DES x 2 CFX    Patient Active Problem List   Diagnosis Date Noted  . Unstable angina ([place]) 04/16/2015  . NSTEMI (non-ST elevated myocardial infarction) ([place]) 04/04/2015  . HTN (hypertension) 04/04/2015  . Elevated troponin   . Demand ischemia of myocardium ([place])   . Hypertensive urgency 03/25/2014  . Noncompliance 03/25/2014  . ST elevation myocardial infarction (STEMI) of inferior wall, initial episode of care ([place]) 10/28/2012  . Claudication ([place]) 10/28/2012  . Acute bronchitis 10/25/2012  . GERD 10/23/2007  . NEPHROLITHIASIS 10/23/2007  . BACK PAIN, CHRONIC 10/23/2007  . HYPERCHOLESTEROLEMIA 10/22/2007  . ADJUSTMENT DISORDER WITH DEPRESSED MOOD 10/22/2007  . HYPERTENSION, BENIGN ESSENTIAL 10/22/2007  . NONCOMPLIANCE WITH RENAL DIALYSIS 10/22/2007  . Diabetes mellitus ([place]) 01/29/2003  . Coronary atherosclerosis, CABG 2009 and cathj 2014 with graft dysfunction 01/28/2001    Past Surgical History:  Procedure Laterality Date  . CARDIAC CATHETERIZATION N/A 04/06/2015   Procedure: Left Heart Cath and Cors/Grafts Angiography;  Surgeon: Lorretta Harp, MD;  Location: [facility];  Service: Cardiovascular;  Laterality: N/A;  . CARDIAC CATHETERIZATION N/A  04/06/2015   Procedure: Coronary Stent Intervention;  Surgeon: Lorretta Harp, MD;  2.5 mm x 12 mm long Synergy DES followed by  2.5 mm x 16 mm long Synergy DES    . CORONARY ARTERY BYPASS GRAFT     2009 LIMA to LAD, SVG to Diag, SVG to PDA and PL, left radial to OM  . LEFT HEART CATH N/A 10/24/2012   Procedure: LEFT HEART CATH;  Surgeon: Troy Sine, MD;  Location: [facility] CATH LAB;  Service:  Cardiovascular;  Laterality: N/A;       Home Medications    Prior to Admission medications   Medication Sig Start Date End Date Taking? Authorizing Provider  amLODipine (NORVASC) 5 MG tablet Take 1 tablet (5 mg total) by mouth daily. 04/17/15   Evelene Croon Barrett, PA-C  aspirin EC 81 MG EC tablet Take 1 tablet (81 mg total) by mouth daily. 10/28/12   Rogelia Mire, NP  atorvastatin (LIPITOR) 80 MG tablet Take 1 tablet (80 mg total) by mouth daily. 04/17/15   Rhonda G Barrett, PA-C  carvedilol (COREG) 25 MG tablet Take 1 tablet (25 mg total) by mouth 2 (two) times daily with a meal. 04/17/15   Rhonda G Barrett, PA-C  clopidogrel (PLAVIX) 75 MG tablet Take 1 tablet (75 mg total) by mouth daily. 04/17/15   Rhonda G Barrett, PA-C  glucose monitoring kit (FREESTYLE) monitoring kit 1 each by Does not apply route once. Glucometer with test strips and lancets to check blood glucose BID ac breakfast and dinner. 10/28/12   Rogelia Mire, NP  isosorbide mononitrate (IMDUR) 30 MG 24 hr tablet Take 1 tablet (30 mg total) by mouth daily. 04/17/15   Rhonda G Barrett, PA-C  metFORMIN (GLUCOPHAGE) 500 MG tablet Take 1 tablet (500 mg total) by mouth 2 (two) times daily with a meal. 04/17/15   Rhonda G Barrett, PA-C  nitroGLYCERIN (NITROSTAT) 0.4 MG SL tablet Place 1 tablet (0.4 mg total) under the tongue every 5 (five) minutes as needed for chest pain. 04/17/15   Evelene Croon Barrett, PA-C    Family History Family History  Problem Relation Age of Onset  . Heart failure Mother   . Cancer Mother   . CAD Mother 59  . Heart failure Father   . Kidney failure Brother     Social History Social History  Substance Use Topics  . Smoking status: Current Some Day Smoker    Last attempt to quit: 10/25/2010  . Smokeless tobacco: Never Used  . Alcohol use Yes     Comment: sts he drank for the first time in a long time last night      Allergies   Patient has no known allergies.   Review of Systems Review of  Systems ROS reviewed and all are negative for acute change except as noted in the HPI.  Physical Exam Updated Vital Signs Ht '6\' 1"'$  (1.854 m)   Wt 106.6 kg   BMI 31.00 kg/m   Physical Exam  Constitutional: He is oriented to person, place, and time. Vital signs are normal. He appears well-developed and well-nourished.  NAD. Answers questions appropriately   HENT:  Head: Normocephalic.  Right Ear: Hearing normal.  Left Ear: Hearing normal.  Eyes: Conjunctivae and EOM are normal. Pupils are equal, round, and reactive to light.  Neck: Normal range of motion. Neck supple.  Cardiovascular: Normal rate, regular rhythm, normal heart sounds, intact distal pulses and normal pulses.   Distal pulses appreciated x  4 extremities  Pulmonary/Chest: Effort normal and breath sounds normal.  Abdominal: Soft. Bowel sounds are normal.  Musculoskeletal: Normal range of motion.  Neurological: He is alert and oriented to person, place, and time.  Skin: Skin is warm and dry.  Psychiatric: He has a normal mood and affect. His speech is normal and behavior is normal. Thought content normal.  Nursing note and vitals reviewed.  ED Treatments / Results  Labs (all labs ordered are listed, but only abnormal results are displayed) Labs Reviewed  CBC - Abnormal; Notable for the following:       Result Value   WBC 10.6 (*)    All other components within normal limits  I-STAT TROPOININ, ED - Abnormal; Notable for the following:    Troponin i, poc 0.36 (*)    All other components within normal limits  BASIC METABOLIC PANEL   EKG  EKG Interpretation  Date/Time:  Friday December 22 2015 13:18:06 EST Ventricular Rate:  97 PR Interval:    QRS Duration: 107 QT Interval:  389 QTC Calculation: 495 R Axis:   91 Text Interpretation:  Sinus rhythm Left atrial enlargement Borderline right axis deviation Borderline prolonged QT interval similar to prior ECG, decreased inferior  st changes compared to prior ECG 16 Apr 2015 Confirmed by [facility]  MD-J, Wille Glaser ([id number]) on 12/22/2015 1:21:43 PM Also confirmed by [facility]  MD-J, JON [phone]), editor Rolla Plate, Joelene Millin [phone])  on 12/22/2015 1:23:43 PM       Radiology Dg Chest 2 View  Result Date: 12/22/2015 CLINICAL DATA:  Midsternal chest pain. EXAM: CHEST  2 VIEW COMPARISON:  04/16/2015 FINDINGS: Postsurgical changes from CABG is stable. Cardiomediastinal silhouette is mildly enlarged. Mediastinal contours appear intact. There is no evidence of focal airspace consolidation, pleural effusion or pneumothorax. Osseous structures are without acute abnormality. Soft tissues are grossly normal. IMPRESSION: Mildly enlarged cardiac silhouette, otherwise no active cardiopulmonary disease. Electronically Signed   By: Fidela Salisbury M.D.   On: 12/22/2015 14:04    Procedures Procedures (including critical care time)  Medications Ordered in ED Medications - No data to display   Initial Impression / Assessment and Plan / ED Course  I have reviewed the triage vital signs and the nursing notes.  Pertinent labs & imaging results that were available during my care of the patient were reviewed by me and considered in my medical decision making (see chart for details).  Clinical Course    Final Clinical Impressions(s) / ED Diagnoses  {I have reviewed and evaluated the relevant laboratory values. {I have reviewed and evaluated the relevant imaging studies. {I have interpreted the relevant EKG. {I have reviewed the relevant previous healthcare records. {I have reviewed EMS Documentation. {I obtained HPI from historian. {Patient discussed with supervising physician.  ED Course:  Assessment: Pt is a 60yM HTN, T2DM, CAD, PCI (DES to p/d LCX in 07/2006, DES to RCA/RI in 08/2007) and multivessel CABGx5 in 11/2007, STEMI in 2014 (felt to be due to occlusion of SVG-D1) presents with CP this AM at rest. Relief with NTG x 2. ASA given. No active chest pain currently. Risk Factors HTN, DM,  HLD, CAD. Concern for cardiac etiology of Chest Pain. Pt has been re-evaluated prior to consult and VSS. Hypertensive initially due to noncompliance with medications. Given home meds in ED. NAD, heart RRR, pain 0/10, lungs CTAB. No acute abnormalities found on EKG and first round of Troponin positive at 0.36. Started on Heparin. Withheld Nitro as pt without active CP. Consult  to Cardiology placed.   Disposition/Plan:  Admit to Cardiology  Pt acknowledges and agrees with plan  Supervising Physician Dorie Rank, MD  Final diagnoses:  Elevated troponin  Chest pain, unspecified type    New Prescriptions New Prescriptions   No medications on file     Shary Decamp, PA-C 12/22/15 [place], MD 12/23/15 [phone]

## 2015-12-22 NOTE — Progress Notes (Signed)
ANTICOAGULATION CONSULT NOTE - Initial Consult  Pharmacy Consult for heparin Indication: chest pain/ACS  No Known Allergies  Patient Measurements: Height: 6\' 1"  (185.4 cm) Weight: 235 lb (106.6 kg) IBW/kg (Calculated) : 79.9 Heparin Dosing Weight: 101.9 kg  Vital Signs: BP: 182/110 (11/24 1503) Pulse Rate: 92 (11/24 1503)  Labs:  Recent Labs  12/22/15 1320  HGB 14.4  HCT 41.7  PLT 210    CrCl cannot be calculated (Patient's most recent lab result is older than the maximum 21 days allowed.).   Medical History: Past Medical History:  Diagnosis Date  . Claudication ([facility])   . Coronary artery disease    a. 08/2007 s/p DES to RCA/RI;  b. 07/2006 s/p DES to p/d LCX;  c. 11/2007 CABGx5 (LIMA->LAD, VG->D1, LRA->OM1, VG->PDA->LPL;  d. 09/2012 STEMI/Cath: LM nl, LAD 100p, LCX 70p/90d, OM1 95, LPL 90d, RCA 100, LIMA->LAD nl (LAD 95d), RA->OM nl, VG->PDA->LPL 100, unable to locate VG->Diag, EF 60-65%->Med Rx.  . Diabetes mellitus without complication ([facility])    a. A1c 7.0 09/2012.  Marland Kitchen. Hyperlipidemia   . Hypertension   . Noncompliance   . NSTEMI (non-ST elevated myocardial infarction) ([facility]) 04/04/2015   DES x 2 CFX   Assessment: 60 yo M in ED with CP.  Pharmacy consulted to dose heparin for ACS.  Pt has been out of his HTN meds for a month. TBW 106.6 kg, HDW 101.9 kg, CBC WNL, troponin 0.36, creat 1.17.   Goal of Therapy:  Heparin level 0.3-0.7 units/ml Monitor platelets by anticoagulation protocol: Yes   Plan:  Give 4000 units bolus x 1 Start heparin infusion at 1250 units/hr Check anti-Xa level in 6 hours and daily while on heparin Continue to monitor H&H and platelets   Herby AbrahamMichelle T. Juline Sanderford, Pharm.D. [phone] 12/22/2015 3:28 PM

## 2015-12-22 NOTE — ED Notes (Signed)
Dr. Lynelle DoctorKnapp notified by Lacie DraftKyle Everett-EMT of elevated I-stat Trop of 0.36

## 2015-12-22 NOTE — ED Notes (Signed)
Attempted report X2 

## 2015-12-23 DIAGNOSIS — I25110 Atherosclerotic heart disease of native coronary artery with unstable angina pectoris: Secondary | ICD-10-CM | POA: Diagnosis present

## 2015-12-23 DIAGNOSIS — Z841 Family history of disorders of kidney and ureter: Secondary | ICD-10-CM | POA: Diagnosis not present

## 2015-12-23 DIAGNOSIS — Z9114 Patient's other noncompliance with medication regimen: Secondary | ICD-10-CM | POA: Diagnosis not present

## 2015-12-23 DIAGNOSIS — F17210 Nicotine dependence, cigarettes, uncomplicated: Secondary | ICD-10-CM | POA: Diagnosis present

## 2015-12-23 DIAGNOSIS — Z79899 Other long term (current) drug therapy: Secondary | ICD-10-CM | POA: Diagnosis not present

## 2015-12-23 DIAGNOSIS — E871 Hypo-osmolality and hyponatremia: Secondary | ICD-10-CM | POA: Diagnosis present

## 2015-12-23 DIAGNOSIS — R748 Abnormal levels of other serum enzymes: Secondary | ICD-10-CM | POA: Diagnosis not present

## 2015-12-23 DIAGNOSIS — I160 Hypertensive urgency: Secondary | ICD-10-CM | POA: Diagnosis present

## 2015-12-23 DIAGNOSIS — K219 Gastro-esophageal reflux disease without esophagitis: Secondary | ICD-10-CM | POA: Diagnosis present

## 2015-12-23 DIAGNOSIS — Z8249 Family history of ischemic heart disease and other diseases of the circulatory system: Secondary | ICD-10-CM | POA: Diagnosis not present

## 2015-12-23 DIAGNOSIS — I200 Unstable angina: Secondary | ICD-10-CM | POA: Diagnosis not present

## 2015-12-23 DIAGNOSIS — E7800 Pure hypercholesterolemia, unspecified: Secondary | ICD-10-CM | POA: Diagnosis present

## 2015-12-23 DIAGNOSIS — Z7982 Long term (current) use of aspirin: Secondary | ICD-10-CM | POA: Diagnosis not present

## 2015-12-23 DIAGNOSIS — Z9119 Patient's noncompliance with other medical treatment and regimen: Secondary | ICD-10-CM | POA: Diagnosis not present

## 2015-12-23 DIAGNOSIS — E1151 Type 2 diabetes mellitus with diabetic peripheral angiopathy without gangrene: Secondary | ICD-10-CM | POA: Diagnosis present

## 2015-12-23 DIAGNOSIS — Z7902 Long term (current) use of antithrombotics/antiplatelets: Secondary | ICD-10-CM | POA: Diagnosis not present

## 2015-12-23 DIAGNOSIS — Z7984 Long term (current) use of oral hypoglycemic drugs: Secondary | ICD-10-CM | POA: Diagnosis not present

## 2015-12-23 DIAGNOSIS — Z951 Presence of aortocoronary bypass graft: Secondary | ICD-10-CM | POA: Diagnosis not present

## 2015-12-23 DIAGNOSIS — I214 Non-ST elevation (NSTEMI) myocardial infarction: Secondary | ICD-10-CM | POA: Diagnosis present

## 2015-12-23 DIAGNOSIS — I10 Essential (primary) hypertension: Secondary | ICD-10-CM | POA: Diagnosis present

## 2015-12-23 DIAGNOSIS — Z955 Presence of coronary angioplasty implant and graft: Secondary | ICD-10-CM | POA: Diagnosis not present

## 2015-12-23 DIAGNOSIS — I252 Old myocardial infarction: Secondary | ICD-10-CM | POA: Diagnosis not present

## 2015-12-23 LAB — GLUCOSE, CAPILLARY
GLUCOSE-CAPILLARY: 100 mg/dL — AB (ref 65–99)
GLUCOSE-CAPILLARY: 240 mg/dL — AB (ref 65–99)
GLUCOSE-CAPILLARY: 95 mg/dL (ref 65–99)
GLUCOSE-CAPILLARY: 80 mg/dL (ref 65–99)

## 2015-12-23 LAB — CBC
HEMATOCRIT: 39.5 % (ref 39.0–52.0)
HEMOGLOBIN: 13.2 g/dL (ref 13.0–17.0)
MCH: 28.3 pg (ref 26.0–34.0)
MCHC: 33.4 g/dL (ref 30.0–36.0)
MCV: 84.6 fL (ref 78.0–100.0)
Platelets: 196 10*3/uL (ref 150–400)
RBC: 4.67 MIL/uL (ref 4.22–5.81)
RDW: 14.7 % (ref 11.5–15.5)
WBC: 5.9 10*3/uL (ref 4.0–10.5)

## 2015-12-23 LAB — COMPREHENSIVE METABOLIC PANEL
ALT: 12 U/L — ABNORMAL LOW (ref 17–63)
ANION GAP: 7 (ref 5–15)
AST: 21 U/L (ref 15–41)
Albumin: 3 g/dL — ABNORMAL LOW (ref 3.5–5.0)
Alkaline Phosphatase: 69 U/L (ref 38–126)
BUN: 20 mg/dL (ref 6–20)
CHLORIDE: 103 mmol/L (ref 101–111)
CO2: 25 mmol/L (ref 22–32)
Calcium: 8.7 mg/dL — ABNORMAL LOW (ref 8.9–10.3)
Creatinine, Ser: 1.28 mg/dL — ABNORMAL HIGH (ref 0.61–1.24)
GFR, EST NON AFRICAN AMERICAN: 60 mL/min — AB (ref >60)
Glucose, Bld: 99 mg/dL (ref 65–99)
Potassium: 4.2 mmol/L (ref 3.5–5.1)
SODIUM: 135 mmol/L (ref 135–145)
Total Bilirubin: 0.6 mg/dL (ref 0.3–1.2)
Total Protein: 6.1 g/dL — ABNORMAL LOW (ref 6.5–8.1)

## 2015-12-23 LAB — TROPONIN I
TROPONIN I: 2.53 ng/mL — AB (ref <0.03)
TROPONIN I: 2.26 ng/mL — AB (ref <0.03)

## 2015-12-23 LAB — HEPARIN LEVEL (UNFRACTIONATED): Heparin Unfractionated: 0.43 IU/mL (ref 0.30–0.70)

## 2015-12-23 NOTE — Progress Notes (Signed)
ANTICOAGULATION CONSULT NOTE - Follow Up Consult  Pharmacy Consult for Heparin Indication: chest pain/ACS  No Known Allergies  Patient Measurements: Height: 6\' 1"  (185.4 cm) Weight: 210 lb 1.6 oz (95.3 kg) IBW/kg (Calculated) : 79.9 Heparin Dosing Weight: 101.9  Vital Signs: Temp: 98.3 F (36.8 C) (11/25 0851) Temp Source: Oral (11/25 0851) BP: 164/92 (11/25 0615) Pulse Rate: 76 (11/25 0615)  Labs:  Recent Labs  12/22/15 1320 12/22/15 1930 12/22/15 2137 12/23/15 0118 12/23/15 0720  HGB 14.4  --   --   --  13.2  HCT 41.7  --   --   --  39.5  PLT 210  --   --   --  196  HEPARINUNFRC  --   --  0.33  --  0.43  CREATININE 1.17  --   --   --  1.28*  TROPONINI  --  1.43*  --  2.53* 2.26*    Estimated Creatinine Clearance: 70.2 mL/min (by C-G formula based on SCr of 1.28 mg/dL (H)).  Assessment: 60yom started on heparin earlier today for chest pain with elevated troponin. Initial heparin level is therapeutic at 0.33. Plan per cardiology is for medical management unless he has recurrent pain.   Pharmacy consulted to dose heparin for ACS.  Pt has been out of his HTN meds for a month. HDW 101.9 kg. Trop 0.36>1.43>2.26. HL therapeutic x2 on current rate. CBC stable, no signs/symptoms of bleeding noted.   Goal of Therapy:  Heparin level 0.3-0.7 units/ml Monitor platelets by anticoagulation protocol: Yes   Plan:  Continue heparin at 1250 units/hr Heparin level and CBC daily Monitor signs and symptoms of bleeding  Alfredo BachJoseph Aliya Sol, BS, PharmD Clinical Pharmacy Resident [phone] (Pager) 12/23/2015 11:58 AM

## 2015-12-23 NOTE — Progress Notes (Signed)
Patient Name: Darrell [name] Date of Encounter: 12/23/2015  Primary Cardiologist: Dr. Sandra CockayneJames Hochrein  Hospital Problem List     Principal Problem:   Unstable angina [facility](HCC) Active Problems:   Diabetes mellitus ([facility])   HYPERCHOLESTEROLEMIA   Coronary atherosclerosis, s/p CABG in 2009    Hypertensive urgency   Noncompliance   Elevated troponin    Subjective   No recurrent chest pain or shortness of breath. Has not ambulated.  Inpatient Medications    Scheduled Meds: . amLODipine  10 mg Oral Daily  . aspirin EC  81 mg Oral Daily  . atorvastatin  80 mg Oral q1800  . carvedilol  25 mg Oral BID WC  . clopidogrel  75 mg Oral Daily  . insulin aspart  0-15 Units Subcutaneous TID WC  . isosorbide mononitrate  30 mg Oral Daily   Continuous Infusions: . heparin 1,250 Units/hr (12/23/15 0503)   PRN Meds: acetaminophen, nitroGLYCERIN, ondansetron (ZOFRAN) IV   Vital Signs    Vitals:   12/22/15 1841 12/22/15 2319 12/23/15 0615 12/23/15 0851  BP:  139/87 (!) 164/92   Pulse:  66 76   Resp: (!) 21     Temp: 98.1 F (36.7 C) 98.4 F (36.9 C) 97.7 F (36.5 C) 98.3 F (36.8 C)  TempSrc: Oral Oral Oral Oral  SpO2:  98% 98%   Weight:   210 lb 1.6 oz (95.3 kg)   Height:        Intake/Output Summary (Last 24 hours) at 12/23/15 1159 Last data filed at 12/23/15 0557  Gross per 24 hour  Intake           188.13 ml  Output             1700 ml  Net         -1511.87 ml   Filed Weights   12/22/15 1315 12/23/15 0615  Weight: 235 lb (106.6 kg) 210 lb 1.6 oz (95.3 kg)    Physical Exam   General: Disheveled male, no distress. HEENT: Conjunctiva and lids normal, oropharynx clear. Neck: Supple, no elevated JVP or carotid bruits, no thyromegaly. Lungs: Clear to auscultation, nonlabored breathing at rest. Cardiac: Regular rate and rhythm, no S3, soft systolic murmur, no pericardial rub. Abdomen: Soft, nontender, bowel sounds present. Extremities: No pitting edema, distal  pulses 2+.  Labs    CBC  Recent Labs  12/22/15 1320 12/23/15 0720  WBC 10.6* 5.9  HGB 14.4 13.2  HCT 41.7 39.5  MCV 83.7 84.6  PLT 210 196   Basic Metabolic Panel  Recent Labs  12/22/15 1320 12/23/15 0720  NA 130* 135  K 5.0 4.2  CL 99* 103  CO2 23 25  GLUCOSE 113* 99  BUN 23* 20  CREATININE 1.17 1.28*  CALCIUM 8.6* 8.7*   Liver Function Tests  Recent Labs  12/23/15 0720  AST 21  ALT 12*  ALKPHOS 69  BILITOT 0.6  PROT 6.1*  ALBUMIN 3.0*   Cardiac Enzymes  Recent Labs  12/22/15 1930 12/23/15 0118 12/23/15 0720  TROPONINI 1.43* 2.53* 2.26*   Telemetry    I personally reviewed telemetry monitoring which shows sinus rhythm.  ECG    I personally reviewed the tracing from11/14/2017 which shows sinus rhythm with left atrial enlargement, decreased R wave progression, nonspecific ST changes.  Radiology    Dg Chest 2 View  Result Date: 12/22/2015 CLINICAL DATA:  Midsternal chest pain. EXAM: CHEST  2 VIEW COMPARISON:  04/16/2015 FINDINGS: Postsurgical changes from CABG  is stable. Cardiomediastinal silhouette is mildly enlarged. Mediastinal contours appear intact. There is no evidence of focal airspace consolidation, pleural effusion or pneumothorax. Osseous structures are without acute abnormality. Soft tissues are grossly normal. IMPRESSION: Mildly enlarged cardiac silhouette, otherwise no active cardiopulmonary disease. Electronically Signed   By: Ted Mcalpineobrinka  Dimitrova M.D.   On: 12/22/2015 14:04    Cardiac Studies   Cardiac catheterization 04/06/2015:  Ost LAD to Mid LAD lesion, 100% stenosed. The lesion was previously treated with a stent (unknown type).  Mid RCA lesion, 100% stenosed. The lesion was previously treated with a stent (unknown type).  Dist Cx-1 lesion, 95% stenosed. The lesion was previously treated with a stent (unknown type).  Dist Cx-2 lesion, 95% stenosed.  Origin to Prox Graft lesion, 100% stenosed.  Dist LAD lesion, 95%  stenosed.  Ost Cx to Mid Cx lesion, 95% stenosed. Post intervention, there is a 0% residual stenosis. The lesion was previously treated with a stent (unknown type).  There is mild left ventricular systolic dysfunction.  Patient Profile     60 year old male with a history of multivessel CAD status post CABG in 2009 with subsequent documented graft disease and status post DES intervention to the circumflex in March of this year. He presents with unstable angina and hypertensive urgency, subsequent documentation of NSTEMI. This complicated by noncompliance and no outpatient cardiology follow-up.  Assessment & Plan    1. NSTEMI, troponin I up to 2.5. He is pain-free at this point.  2. Multivessel CAD status post CABG in 2009 with subsequent documented graft disease and DES intervention to the native circumflex in March of this year.  3. Noncompliance.  4. Essential hypertension, presenting with hypertensive urgency.  Discussed with patient and reviewed Dr. Jenene SlickerHochrein's recommendations for an initial attempt at medical therapy. Would continue IV heparin for now. On aspirin, Plavix, Coreg, Lipitor, Norvasc, and Imdur. Would gradually increase activity, reassess for recurrent symptoms. Still relatively low threshold for cardiac catheterization depending on clinical progress on medical therapy.  Signed, Jonelle SidleSamuel G. Dayven Linsley, M.D., F.A.C.C.  12/23/2015, 11:59 AM

## 2015-12-24 ENCOUNTER — Encounter (HOSPITAL_COMMUNITY): Payer: Self-pay | Admitting: Physician Assistant

## 2015-12-24 LAB — BASIC METABOLIC PANEL
ANION GAP: 7 (ref 5–15)
BUN: 18 mg/dL (ref 6–20)
CALCIUM: 8.6 mg/dL — AB (ref 8.9–10.3)
CO2: 26 mmol/L (ref 22–32)
Chloride: 102 mmol/L (ref 101–111)
Creatinine, Ser: 1.14 mg/dL (ref 0.61–1.24)
Glucose, Bld: 130 mg/dL — ABNORMAL HIGH (ref 65–99)
POTASSIUM: 4 mmol/L (ref 3.5–5.1)
Sodium: 135 mmol/L (ref 135–145)

## 2015-12-24 LAB — HEPARIN LEVEL (UNFRACTIONATED): HEPARIN UNFRACTIONATED: 0.23 [IU]/mL — AB (ref 0.30–0.70)

## 2015-12-24 LAB — GLUCOSE, CAPILLARY
GLUCOSE-CAPILLARY: 102 mg/dL — AB (ref 65–99)
GLUCOSE-CAPILLARY: 144 mg/dL — AB (ref 65–99)

## 2015-12-24 LAB — CBC
HEMATOCRIT: 38.1 % — AB (ref 39.0–52.0)
Hemoglobin: 12.4 g/dL — ABNORMAL LOW (ref 13.0–17.0)
MCH: 27.7 pg (ref 26.0–34.0)
MCHC: 32.5 g/dL (ref 30.0–36.0)
MCV: 85 fL (ref 78.0–100.0)
Platelets: 173 10*3/uL (ref 150–400)
RBC: 4.48 MIL/uL (ref 4.22–5.81)
RDW: 14.9 % (ref 11.5–15.5)
WBC: 6 10*3/uL (ref 4.0–10.5)

## 2015-12-24 MED ORDER — ISOSORBIDE MONONITRATE ER 30 MG PO TB24
30.0000 mg | ORAL_TABLET | Freq: Every day | ORAL | 3 refills | Status: DC
Start: 2015-12-24 — End: 2016-01-04

## 2015-12-24 MED ORDER — METFORMIN HCL 500 MG PO TABS: 500.0000 mg | ORAL_TABLET | Freq: Two times a day (BID) | ORAL | 3 refills | Status: DC

## 2015-12-24 MED ORDER — CARVEDILOL 25 MG PO TABS: 25.0000 mg | ORAL_TABLET | Freq: Two times a day (BID) | ORAL | 3 refills | Status: DC

## 2015-12-24 MED ORDER — AMLODIPINE BESYLATE 10 MG PO TABS: 10.0000 mg | ORAL_TABLET | Freq: Every day | ORAL | 3 refills | Status: DC

## 2015-12-24 MED ORDER — CLOPIDOGREL BISULFATE 75 MG PO TABS: 75.0000 mg | ORAL_TABLET | Freq: Every day | ORAL | 3 refills | Status: DC

## 2015-12-24 MED ORDER — LOSARTAN POTASSIUM 25 MG PO TABS: 25.0000 mg | ORAL_TABLET | Freq: Every day | ORAL | 3 refills | Status: DC

## 2015-12-24 MED ORDER — ATORVASTATIN CALCIUM 80 MG PO TABS: 80.0000 mg | ORAL_TABLET | Freq: Every day | ORAL | 3 refills | Status: DC

## 2015-12-24 MED ORDER — LOSARTAN POTASSIUM 25 MG PO TABS
25.0000 mg | ORAL_TABLET | Freq: Every day | ORAL | Status: DC
  Administered 2015-12-24: 25 mg via ORAL
  Filled 2015-12-24: qty 1

## 2015-12-24 NOTE — Progress Notes (Signed)
ANTICOAGULATION CONSULT NOTE  Pharmacy Consult for Heparin Indication: chest pain/ACS  No Known Allergies  Patient Measurements: Height: 6\' 1"  (185.4 cm) Weight: 210 lb 1.6 oz (95.3 kg) IBW/kg (Calculated) : 79.9 Heparin Dosing Weight: 101.9  Vital Signs: Temp: 99 F (37.2 C) (11/25 2100) BP: 144/91 (11/25 2100) Pulse Rate: 75 (11/25 2100)  Labs:  Recent Labs  12/22/15 1320 12/22/15 1930 12/22/15 2137 12/23/15 0118 12/23/15 0720 12/24/15 0357  HGB 14.4  --   --   --  13.2 12.4*  HCT 41.7  --   --   --  39.5 38.1*  PLT 210  --   --   --  196 173  HEPARINUNFRC  --   --  0.33  --  0.43 0.23*  CREATININE 1.17  --   --   --  1.28*  --   TROPONINI  --  1.43*  --  2.53* 2.26*  --     Estimated Creatinine Clearance: 70.2 mL/min (by C-G formula based on SCr of 1.28 mg/dL (H)).  Assessment: 60 y.o. male with chest pain/NSTEMI for heparin   Goal of Therapy:  Heparin level 0.3-0.7 units/ml Monitor platelets by anticoagulation protocol: Yes   Plan:  Increase Heparin 1400 units/hr  Geannie RisenGreg Fares Ramthun, PharmD, BCPS  12/24/2015 5:35 AM

## 2015-12-24 NOTE — Discharge Instructions (Signed)
Nonspecific Chest Pain °Chest pain can be caused by many different conditions. There is always a chance that your pain could be related to something serious, such as a heart attack or a blood clot in your lungs. Chest pain can also be caused by conditions that are not life-threatening. If you have chest pain, it is very important to follow up with your health care provider. °What are the causes? °Causes of this condition include: °· Heartburn. °· Pneumonia or bronchitis. °· Anxiety or stress. °· Inflammation around your heart (pericarditis) or lung (pleuritis or pleurisy). °· A blood clot in your lung. °· A collapsed lung (pneumothorax). This can develop suddenly on its own (spontaneous pneumothorax) or from trauma to the chest. °· Shingles infection (varicella-zoster virus). °· Heart attack. °· Damage to the bones, muscles, and cartilage that make up your chest wall. This can include: °? Bruised bones due to injury. °? Strained muscles or cartilage due to frequent or repeated coughing or overwork. °? Fracture to one or more ribs. °? Sore cartilage due to inflammation (costochondritis). ° °What increases the risk? °Risk factors for this condition may include: °· Activities that increase your risk for trauma or injury to your chest. °· Respiratory infections or conditions that cause frequent coughing. °· Medical conditions or overeating that can cause heartburn. °· Heart disease or family history of heart disease. °· Conditions or health behaviors that increase your risk of developing a blood clot. °· Having had chicken pox (varicella zoster). ° °What are the signs or symptoms? °Chest pain can feel like: °· Burning or tingling on the surface of your chest or deep in your chest. °· Crushing, pressure, aching, or squeezing pain. °· Dull or sharp pain that is worse when you move, cough, or take a deep breath. °· Pain that is also felt in your back, neck, shoulder, or arm, or pain that spreads to any of these  areas. ° °Your chest pain may come and go, or it may stay constant. °How is this diagnosed? °Lab tests or other studies may be needed to find the cause of your pain. Your health care provider may have you take a test called an ECG (electrocardiogram). An ECG records your heartbeat patterns at the time the test is performed. You may also have other tests, such as: °· Transthoracic echocardiogram (TTE). In this test, sound waves are used to create a picture of the heart structures and to look at how blood flows through your heart. °· Transesophageal echocardiogram (TEE). This is a more advanced imaging test that takes images from inside your body. It allows your health care provider to see your heart in finer detail. °· Cardiac monitoring. This allows your health care provider to monitor your heart rate and rhythm in real time. °· Holter monitor. This is a portable device that records your heartbeat and can help to diagnose abnormal heartbeats. It allows your health care provider to track your heart activity for several days, if needed. °· Stress tests. These can be done through exercise or by taking medicine that makes your heart beat more quickly. °· Blood tests. °· Other imaging tests. ° °How is this treated? °Treatment depends on what is causing your chest pain. Treatment may include: °· Medicines. These may include: °? Acid blockers for heartburn. °? Anti-inflammatory medicine. °? Pain medicine for inflammatory conditions. °? Antibiotic medicine, if an infection is present. °? Medicines to dissolve blood clots. °? Medicines to treat coronary artery disease (CAD). °· Supportive care for conditions that   do not require medicines. This may include: °? Resting. °? Applying heat or cold packs to injured areas. °? Limiting activities until pain decreases. ° °Follow these instructions at home: °Medicines °· If you were prescribed an antibiotic, take it as told by your health care provider. Do not stop taking the  antibiotic even if you start to feel better. °· Take over-the-counter and prescription medicines only as told by your health care provider. °Lifestyle °· Do not use any products that contain nicotine or tobacco, such as cigarettes and e-cigarettes. If you need help quitting, ask your health care provider. °· Do not drink alcohol. °· Make lifestyle changes as directed by your health care provider. These may include: °? Getting regular exercise. Ask your health care provider to suggest some activities that are safe for you. °? Eating a heart-healthy diet. A registered dietitian can help you to learn healthy eating options. °? Maintaining a healthy weight. °? Managing diabetes, if necessary. °? Reducing stress, such as with yoga or relaxation techniques. °General instructions °· Avoid any activities that bring on chest pain. °· If heartburn is the cause for your chest pain, raise (elevate) the head of your bed about 6 inches (15 cm) by putting blocks under the legs. Sleeping with more pillows does not effectively relieve heartburn because it only changes the position of your head. °· Keep all follow-up visits as told by your health care provider. This is important. This includes any further testing if your chest pain does not go away. °Contact a health care provider if: °· Your chest pain does not go away. °· You have a rash with blisters on your chest. °· You have a fever. °· You have chills. °Get help right away if: °· Your chest pain is worse. °· You have a cough that gets worse, or you cough up blood. °· You have severe pain in your abdomen. °· You have severe weakness. °· You faint. °· You have sudden, unexplained chest discomfort. °· You have sudden, unexplained discomfort in your arms, back, neck, or jaw. °· You have shortness of breath at any time. °· You suddenly start to sweat, or your skin gets clammy. °· You feel nauseous or you vomit. °· You suddenly feel light-headed or dizzy. °· Your heart begins to beat  quickly, or it feels like it is skipping beats. °These symptoms may represent a serious problem that is an emergency. Do not wait to see if the symptoms will go away. Get medical help right away. Call your local emergency services (911 in the [place] Do not drive yourself to the hospital. °This information is not intended to replace advice given to you by your health care provider. Make sure you discuss any questions you have with your health care provider. °Document Released: 10/24/2004 Document Revised: 10/09/2015 Document Reviewed: 10/09/2015 °Elsevier Interactive Patient Education © 2017 [employer] ° °

## 2015-12-24 NOTE — Progress Notes (Signed)
CSW met with pt @ bedside to discuss [employer] transportation.Pt reports lack of transportation has made him miss numerous medical appts.  Pt reports that he was previously connected to services. CSW gave application but encouraged pt to call to see if he is still active. Pt has no other needs at this time, discharging today, son and friend will pick pt up.  CSW is signing off.  Guiseppe Flanagan B. Joline Maxcy Clinical Social Work Dept Weekend Social Worker [phone] 12:50 PM

## 2015-12-24 NOTE — Discharge Summary (Signed)
Discharge Summary    Patient ID: Darrell [name],  MRN: [id number], DOB/AGE: [birth year] 59 y.o.  Admit date: 12/22/2015 Discharge date: 12/24/2015  Primary Care Provider: No PCP Per Patient Primary Cardiologist: Dr. Percival [name] - never Seen in the outpatient setting   Discharge Diagnoses    Principal Problem:   NSTEMI (non-ST elevated myocardial infarction) [name]) Active Problems:   Hypertensive urgency   Diabetes mellitus ([place])   HYPERCHOLESTEROLEMIA   Coronary atherosclerosis, s/p CABG in 2009    GERD   Claudication ([place])   Noncompliance   HTN (hypertension)   Allergies No Known Allergies   History of Present Illness     Darrell [name] is a 60 y.o. male with past medical history of CAD (s/p CABG in 11/2007 w/ LIMA-LAD, SVG-D1, LRA-OM1, SVG-PDA, known occlusion of SVG-D1 and occlusion of SVG-PDA-PLA, and recent DES to mid-LCx in 03/2015), HTN, HLD, Type 2 DM and non compliance who presented to [name] on 12/22/2015 for evaluation of chest pain in the setting of hypertensive urgency.   His last cardiac catheterization was in 03/2015 in the setting of an NSTEMI. Troponin values peaked at 9.51. Cath at that time showed known 3-vessel CAD with patent LRA-OM1 and patent LIMA-mid LAD with diffuse disease along distal LAD. Occluded SVG-PDA-PLA was noted. He did have high grade in-stent restenosis within the proximal codominant circumflex with 2 DES being placed at that time. There was 95% stenosis in the distal Cx but this was not treated at that time due to the small size of the distal vessel and prolonged contrast use of 219m due to the complexity of his PCI to pCx.   [facility]. Upon arrival to the ED, initial BP was elevated to 206/126. He was given Amlodipine 137mdaily and Coreg 2530mith improvement of BP. He reported not taking any of his medications in >1 month, including his Amlodipine 5mg42mily, Atorvastatin 80mg6mly,  Coreg 25mg 26m Plavix 75mg d40m, Imdur 30mg da80m and Metformin 500mg [id number] only been taking ASA 81mg [id number] reported that he ran out of the medications and did not have the number to contact our office. He does not have a PCP.    [name] Course     Consultants: none  NSTEMI: troponin I up to 2.5. ECG with no acute ST/TW changes. He is pain-free at this point. He has ambulated in the hall as well and did okay. Patient preferred medical management at this time vs further invasive work up. IV heparin discontinued and we have resumed home ASA/plavix, statin, BB and long acting nitrates.  Multivessel CAD s/p CABG in 2009: with subsequent documented graft disease and DES intervention to the native circumflex in 03/2015. As above, resume home medical therapy with close outpatient follow up.   Noncompliance: this is an ongoing issue. Long discussions about the importance of compliance and regular follow up  Essential hypertension: presenting with hypertensive urgency. Improved with restarting home meds. He will be discharged on Coreg, Norvasc, Cozaar and Imdur. Needs BMET at follow up.   HLD: LDL at 106 in 03/2015. Goal LDL < 70. He has been resumed on high potency` statin  Tobacco abuse: counseled on cessation.   DMT2: resume home Metformin   The patient has had an uncomplicated [name] course and is recovering well. He has been seen by Dr. McDowell tDomenic [name] deemed ready for discharge home. All follow-up appointments have been scheduled. Smoking cessation  was disscussed in length. Discharge medications are listed below.  _____________  Discharge Vitals Blood pressure (!) 168/106, pulse 73, temperature 98.6 F (37 C), resp. rate 16, height _0  (1.854 m), weight 212 lb 3.2 oz (96.3 kg), SpO2 97 %.  Filed Weights   12/22/15 1315 12/23/15 0615 12/24/15 0500  Weight: 235 lb (106.6 kg) 210 lb 1.6 oz (95.3 kg) 212 lb 3.2 oz (96.3 kg)    Labs & Radiologic Studies      CBC  Recent Labs  12/23/15 0720 12/24/15 0357  WBC 5.9 6.0  HGB 13.2 12.4*  HCT 39.5 38.1*  MCV 84.6 85.0  PLT 196 458   Basic Metabolic Panel  Recent Labs  12/23/15 0720 12/24/15 0357  NA 135 135  K 4.2 4.0  CL 103 102  CO2 25 26  GLUCOSE 99 130*  BUN 20 18  CREATININE 1.28* 1.14  CALCIUM 8.7* 8.6*   Liver Function Tests  Recent Labs  12/23/15 0720  AST 21  ALT 12*  ALKPHOS 69  BILITOT 0.6  PROT 6.1*  ALBUMIN 3.0*   No results for input(s): LIPASE, AMYLASE in the last 72 hours. Cardiac Enzymes  Recent Labs  12/22/15 1930 12/23/15 0118 12/23/15 0720  TROPONINI 1.43* 2.53* 2.26*   BNP Invalid input(s): POCBNP D-Dimer No results for input(s): DDIMER in the last 72 hours. Hemoglobin A1C No results for input(s): HGBA1C in the last 72 hours. Fasting Lipid Panel No results for input(s): CHOL, HDL, LDLCALC, TRIG, CHOLHDL, LDLDIRECT in the last 72 hours. Thyroid Function Tests No results for input(s): TSH, T4TOTAL, T3FREE, THYROIDAB in the last 72 hours.  Invalid input(s): FREET3  Dg Chest 2 View  Result Date: 12/22/2015 CLINICAL DATA:  Midsternal chest pain. EXAM: CHEST  2 VIEW COMPARISON:  04/16/2015 FINDINGS: Postsurgical changes from CABG is stable. Cardiomediastinal silhouette is mildly enlarged. Mediastinal contours appear intact. There is no evidence of focal airspace consolidation, pleural effusion or pneumothorax. Osseous structures are without acute abnormality. Soft tissues are grossly normal. IMPRESSION: Mildly enlarged cardiac silhouette, otherwise no active cardiopulmonary disease. Electronically Signed   By: Fidela Salisbury M.D.   On: 12/22/2015 14:04     Diagnostic Studies/Procedures    ECHOCARDIOGRAM: 04/06/2015 Study Conclusions - Procedure narrative: Transthoracic echocardiography. Image quality was suboptimal. The study was technically difficult, as a result of poor sound wave transmission and body  habitus. Intravenous contrast (Definity) was administered. - Left ventricle: The cavity size was normal. There was severe concentric hypertrophy. Systolic function was normal. The estimated ejection fraction was in the range of 50% to 55%. Mild hypokinesis of the inferior myocardium. Doppler parameters are consistent with abnormal left ventricular relaxation (grade 1 diastolic dysfunction). - Aortic valve: Transvalvular velocity was within the normal range. There was no stenosis. There was no regurgitation. - Mitral valve: There was no regurgitation. - Left atrium: The atrium was severely dilated. - Right ventricle: The cavity size was normal. Wall thickness was normal. - Right atrium: The atrium was mildly dilated. - Tricuspid valve: There was no regurgitation. - Inferior vena cava: The vessel was normal in size.   _____________   Cardiac Catheterization: 03/2015  Ost LAD to Mid LAD lesion, 100% stenosed. The lesion was previously treated with a stent (unknown type).  Mid RCA lesion, 100% stenosed. The lesion was previously treated with a stent (unknown type).  Dist Cx-1 lesion, 95% stenosed. The lesion was previously treated with a stent (unknown type).  Dist Cx-2 lesion, 95% stenosed.  Origin to  Prox Graft lesion, 100% stenosed.  Dist LAD lesion, 95% stenosed.  Ost Cx to Mid Cx lesion, 95% stenosed. Post intervention, there is a 0% residual stenosis. The lesion was previously treated with a stent (unknown type).  There is mild left ventricular systolic dysfunction.  IMPRESSION:Mr. Addo has a patient radial graft to an obtuse marginal branch as a patent LIMA to the mid LAD involving the distal LAD is diffusely diseased throughout its entirety. There are left-to-right collaterals. The native right is occluded as is the graft to the PDA and PLA. He does have high grade "in-stent restenosis" within the proximal codominant circumflex which may be his  culprit lesion although he has distal disease as well. I was able to stent the proximal circumflex however this was a technically challenging the long procedure using 240 mL of contrast. I elected not to intervene on the distal circumflex at this time but rather treat him medically to assess efficacy of revascularization. The patient remained hemodynamically and electrically stable throughout the procedure. The guidewire and catheter were removed. Sheath was secured. The patient was on IV nitroglycerin and his Angiomax will be continued for 4 additional hours. The sheath will be removed after and pressure held. The patient will be medically for his residual CAD.    Disposition   Pt is being discharged home today in good condition.  Follow-up Plans & Appointments    Follow-up Information    Angelena Form, PA-C Follow up on 01/02/2016.   Specialties:  Cardiology, Radiology Why:  @ 9am ( please arrive 15 minutes early) Contact information: [place] [id number] [phone]            Discharge Medications     Medication List    TAKE these medications   amLODipine 10 MG tablet Commonly known as:  NORVASC Take 1 tablet (10 mg total) by mouth daily. What changed:  medication strength  how much to take   aspirin 81 MG EC tablet Take 1 tablet (81 mg total) by mouth daily.   atorvastatin 80 MG tablet Commonly known as:  LIPITOR Take 1 tablet (80 mg total) by mouth daily.   carvedilol 25 MG tablet Commonly known as:  COREG Take 1 tablet (25 mg total) by mouth 2 (two) times daily with a meal.   clopidogrel 75 MG tablet Commonly known as:  PLAVIX Take 1 tablet (75 mg total) by mouth daily.   glucose monitoring kit monitoring kit 1 each by Does not apply route once. Glucometer with test strips and lancets to check blood glucose BID ac breakfast and dinner.   isosorbide mononitrate 30 MG 24 hr tablet Commonly known as:  IMDUR Take 1  tablet (30 mg total) by mouth daily.   losartan 25 MG tablet Commonly known as:  COZAAR Take 1 tablet (25 mg total) by mouth daily. Start taking on:  12/25/2015   metFORMIN 500 MG tablet Commonly known as:  GLUCOPHAGE Take 1 tablet (500 mg total) by mouth 2 (two) times daily with a meal.   nitroGLYCERIN 0.4 MG SL tablet Commonly known as:  NITROSTAT Place 1 tablet (0.4 mg total) under the tongue every 5 (five) minutes as needed for chest pain.        Aspirin prescribed at discharge?  YES High Intensity Statin Prescribed? (Lipitor 40-77m or Crestor 20-429m: YES Beta Blocker Prescribed? YES For EF 45% or less, Was ACEI/[name] Prescribed? YES, EF normal.  ADP Receptor Inhibitor Prescribed? (i.e. Plavix etc.-Includes Medically  Managed Patients): YES For EF <45%, Aldosterone Inhibitor Prescribed? NO, EF normal Was EF assessed during THIS hospitalization? No, recent echo Was Cardiac Rehab II ordered? (Included Medically managed Patients): No   Outstanding Labs/Studies   BMET  Duration of Discharge Encounter   Greater than 30 minutes including physician time.  Signed, Angelena Form PA-C 12/24/2015, 11:31 AM

## 2015-12-24 NOTE — Care Management Note (Signed)
Case Management Note  Patient Details  Name: Darrell GunningSteven S [name] MRN: [id number] Date of Birth: 1955-09-18  Subjective/Objective:                    Unstable angina [facility](HCC)  Action/Plan: CM spoke to patient at the bedside and provided information on [facility] as well as How to contact Medicare for needs. CM spoke to Charge nurse who said that she already called SW regarding transportation to appointments. CM advised patient regarding [employer] or Bus to get to appointments (Patient said that he lives close to a bus route). SW consult pending per charge nurse. Patient denies any further CM needs.   Expected Discharge Date:  12/24/15               Expected Discharge Plan:  Home/Self Care  In-House Referral:  Clinical Social Work, PCP / [facility]  Discharge planning Services  CM Consult  Post Acute Care Choice:    Choice offered to:     DME Arranged:    DME Agency:     HH Arranged:    HH Agency:     Status of Service:  Completed, signed off  If discussed at [employer] Meetings, dates discussed:    Additional Comments:  Darcel SmallingAnna C Cordia Miklos, RN 12/24/2015, 11:15 AM

## 2015-12-24 NOTE — Progress Notes (Signed)
Patient Name: Darrell GunningSteven S Buckel Date of Encounter: 12/24/2015  Primary Cardiologist: Dr. Sandra CockayneJames Hochrein  Hospital Problem List     Principal Problem:   Unstable angina [facility](HCC) Active Problems:   Diabetes mellitus (HCC)   HYPERCHOLESTEROLEMIA   Coronary atherosclerosis, s/p CABG in 2009    Hypertensive urgency   Noncompliance   Elevated troponin    Subjective   Denies any recurrent chest pain. States that he slept well. Also walked in the hall yesterday without chest pain. He would like to go home.  Inpatient Medications    Scheduled Meds: . amLODipine  10 mg Oral Daily  . aspirin EC  81 mg Oral Daily  . atorvastatin  80 mg Oral q1800  . carvedilol  25 mg Oral BID WC  . clopidogrel  75 mg Oral Daily  . insulin aspart  0-15 Units Subcutaneous TID WC  . isosorbide mononitrate  30 mg Oral Daily   Continuous Infusions: . heparin 1,400 Units/hr (12/24/15 0653)   PRN Meds: acetaminophen, nitroGLYCERIN, ondansetron (ZOFRAN) IV   Vital Signs    Vitals:   12/23/15 0851 12/23/15 1507 12/23/15 2100 12/24/15 0500  BP:  137/85 (!) 144/91 (!) 153/92  Pulse:  78 75 73  Resp:   18 16  Temp: 98.3 F (36.8 C) 99.6 F (37.6 C) 99 F (37.2 C) 98.6 F (37 C)  TempSrc: Oral Oral    SpO2:  98% 98% 97%  Weight:    212 lb 3.2 oz (96.3 kg)  Height:        Intake/Output Summary (Last 24 hours) at 12/24/15 [id number] Last data filed at 12/24/15 [id number]  Gross per 24 hour  Intake              360 ml  Output             1575 ml  Net            -1215 ml   Filed Weights   12/22/15 1315 12/23/15 0615 12/24/15 0500  Weight: 235 lb (106.6 kg) 210 lb 1.6 oz (95.3 kg) 212 lb 3.2 oz (96.3 kg)    Physical Exam   General: Disheveled male, no distress. HEENT: Conjunctiva and lids normal, oropharynx clear. Neck: Supple, no elevated JVP or carotid bruits, no thyromegaly. Lungs: Clear to auscultation, nonlabored breathing at rest. Cardiac: Regular rate and rhythm, no S3, soft systolic murmur,  no pericardial rub. Abdomen: Soft, nontender, bowel sounds present. Extremities: No pitting edema, distal pulses 2+.  Labs    CBC  Recent Labs  12/23/15 0720 12/24/15 0357  WBC 5.9 6.0  HGB 13.2 12.4*  HCT 39.5 38.1*  MCV 84.6 85.0  PLT 196 173   Basic Metabolic Panel  Recent Labs  12/23/15 0720 12/24/15 0357  NA 135 135  K 4.2 4.0  CL 103 102  CO2 25 26  GLUCOSE 99 130*  BUN 20 18  CREATININE 1.28* 1.14  CALCIUM 8.7* 8.6*   Liver Function Tests  Recent Labs  12/23/15 0720  AST 21  ALT 12*  ALKPHOS 69  BILITOT 0.6  PROT 6.1*  ALBUMIN 3.0*   Cardiac Enzymes  Recent Labs  12/22/15 1930 12/23/15 0118 12/23/15 0720  TROPONINI 1.43* 2.53* 2.26*   Telemetry    I personally reviewed telemetry monitoring which shows sinus rhythm.  ECG    I personally reviewed the tracing from 12/24/2015 which shows sinus rhythm LVH, old anterior infarct pattern.  Radiology    Dg Chest 2 View  Result Date: 12/22/2015 CLINICAL DATA:  Midsternal chest pain. EXAM: CHEST  2 VIEW COMPARISON:  04/16/2015 FINDINGS: Postsurgical changes from CABG is stable. Cardiomediastinal silhouette is mildly enlarged. Mediastinal contours appear intact. There is no evidence of focal airspace consolidation, pleural effusion or pneumothorax. Osseous structures are without acute abnormality. Soft tissues are grossly normal. IMPRESSION: Mildly enlarged cardiac silhouette, otherwise no active cardiopulmonary disease. Electronically Signed   By: Ted Mcalpineobrinka  Dimitrova M.D.   On: 12/22/2015 14:04    Cardiac Studies   Cardiac catheterization 04/06/2015:  Ost LAD to Mid LAD lesion, 100% stenosed. The lesion was previously treated with a stent (unknown type).  Mid RCA lesion, 100% stenosed. The lesion was previously treated with a stent (unknown type).  Dist Cx-1 lesion, 95% stenosed. The lesion was previously treated with a stent (unknown type).  Dist Cx-2 lesion, 95% stenosed.  Origin to Prox  Graft lesion, 100% stenosed.  Dist LAD lesion, 95% stenosed.  Ost Cx to Mid Cx lesion, 95% stenosed. Post intervention, there is a 0% residual stenosis. The lesion was previously treated with a stent (unknown type).  There is mild left ventricular systolic dysfunction.  Patient Profile     60 year old male with a history of multivessel CAD status post CABG in 2009 with subsequent documented graft disease and status post DES intervention to the circumflex in March of this year. He presents with unstable angina and hypertensive urgency, subsequent documentation of NSTEMI. This is complicated by noncompliance and no outpatient cardiology follow-up.  Assessment & Plan    1. NSTEMI, troponin I up to 2.5. He is pain-free at this point. He has ambulated in the hall as well.  2. Multivessel CAD status post CABG in 2009 with subsequent documented graft disease and DES intervention to the native circumflex in March of this year.  3. Noncompliance.  4. Essential hypertension, presenting with hypertensive urgency.  Discussed with patient and reviewed Dr. Jenene SlickerHochrein's recommendations for an initial attempt at medical therapy. Patient denies any further chest pain and would like to go home today on medical therapy without pursuing cardiac catheterization unless symptoms worsen. I discussed with him the importance of medication and follow-up compliance. He should be scheduled to see Dr. Antoine PocheHochrein or APP in 7 days. Heparin is being stopped and also plan to initiate Cozaar 25 mg daily for additional blood pressure control. Discharge regimen should include aspirin, Coreg, Lipitor, Norvasc, Plavix, Imdur, and Cozaar. He will need a BMET for his follow-up visit.  Signed, Jonelle SidleSamuel G. McDowell, M.D., F.A.C.C.  12/24/2015, 9:22 AM

## 2015-12-26 ENCOUNTER — Encounter: Payer: Self-pay | Admitting: Physician Assistant

## 2015-12-30 NOTE — Progress Notes (Deleted)
 Cardiology Office Note    Date:  12/30/2015   ID:  Alvaro S Augsburger, DOB 01/04/1956, MRN [id number]  PCP:  No PCP Per Patient  Cardiologist:  Dr. Hochrein   CC: post hospital follow up  History of Present Illness:  Darrell [name] is a 60 y.o. male with a history of CAD s/p CABG (2009) and DESx2 to mid-LCx in 03/2015, HTN, HLD, Type 2 DM and non compliance who presents to clinic for post hospital follow up.   He has knonw CAD s/p CABG in 11/2007 w/ LIMA-LAD, SVG-D1, LRA-OM1, SVG-PDA, known occlusion of SVG-D1 and occlusion of SVG-PDA-PLA, and recent DES to mid-LCx in 03/2015.  His last cardiac catheterization was in 03/2015 in the setting of an NSTEMI. Troponin values peaked at 9.51. Cath at that time showed known 3-vessel CAD with patent LRA-OM1 and patent LIMA-mid LAD with diffuse disease along distal LAD. Occluded SVG-PDA-PLA was noted. He did have high grade in-stent restenosis within the proximal codominant circumflex with 2 DES being placed at that time. There was 95% stenosis in the distalCx but this was not treated at that time due to the small size of the distal vessel and prolonged contrast use of 240mL due to the complexity of his PCI to pCx.   He presented to [facility] on 12/22/15 with shooting chest pain. Upon arrival to the ED, initial BP was elevated to 206/126. He reported not taking any of his medications in >1 month,including his Amlodipine 5mg daily, Atorvastatin 80mg daily, Coreg 25mg BID, Plavix 75mg daily, Imdur 30mg daily, and Metformin 500mg BID. Had only been taking ASA 81mg daily. He reported that he ran out of the medications and did not have the number to contact our office. He does not have a PCP.   His troponin peaked at 2.5, but ECG was stable. He was placed on IV heparin and observed. Patient preferred medical management at this time vs further invasive work up. He was restarted on all home medications and discharged home. The importance of compliance was  stressed.   Today he presents to clinic for follow up.     Past Medical History:  Diagnosis Date  . Claudication (HCC)   . Coronary artery disease    a. 08/2007 s/p DES to RCA/RI;  b. 07/2006 s/p DES to p/d LCX;  c. 11/2007 CABGx5 (LIMA->LAD, VG->D1, LRA->OM1, VG->PDA->LPL;  d. 09/2012 STEMI/Cath: LM nl, LAD 100p, LCX 70p/90d, OM1 95, LPL 90d, RCA 100, LIMA->LAD nl (LAD 95d), RA->OM nl, VG->PDA->LPL 100, unable to locate VG->Diag, EF 60-65%->Med Rx. e. 03/2015: DES to mid-Cx in the setting of NSTEMI  . Diabetes mellitus without complication (HCC)    a. A1c 7.0 09/2012.  . Hyperlipidemia   . Hypertension   . Noncompliance     Past Surgical History:  Procedure Laterality Date  . CARDIAC CATHETERIZATION N/A 04/06/2015   Procedure: Left Heart Cath and Cors/Grafts Angiography;  Surgeon: Jonathan J Berry, MD;  Location: [facility] INVASIVE CV LAB;  Service: Cardiovascular;  Laterality: N/A;  . CARDIAC CATHETERIZATION N/A 04/06/2015   Procedure: Coronary Stent Intervention;  Surgeon: Jonathan J Berry, MD;  2.5 mm x 12 mm long Synergy DES followed by  2.5 mm x 16 mm long Synergy DES    . CORONARY ARTERY BYPASS GRAFT     2009 LIMA to LAD, SVG to Diag, SVG to PDA and PL, left radial to OM  . LEFT HEART CATH N/A 10/24/2012   Procedure: LEFT HEART CATH;  Surgeon: Thomas A   Kelly, MD;  Location: [facility] CATH LAB;  Service: Cardiovascular;  Laterality: N/A;    Current Medications: Outpatient Medications Prior to Visit  Medication Sig Dispense Refill  . amLODipine (NORVASC) 10 MG tablet Take 1 tablet (10 mg total) by mouth daily. 90 tablet 3  . aspirin EC 81 MG EC tablet Take 1 tablet (81 mg total) by mouth daily.    . atorvastatin (LIPITOR) 80 MG tablet Take 1 tablet (80 mg total) by mouth daily. 90 tablet 3  . carvedilol (COREG) 25 MG tablet Take 1 tablet (25 mg total) by mouth 2 (two) times daily with a meal. 180 tablet 3  . clopidogrel (PLAVIX) 75 MG tablet Take 1 tablet (75 mg total) by mouth daily. 90 tablet 3   . glucose monitoring kit (FREESTYLE) monitoring kit 1 each by Does not apply route once. Glucometer with test strips and lancets to check blood glucose BID ac breakfast and dinner. (Patient not taking: Reported on 12/22/2015) 1 each 0  . isosorbide mononitrate (IMDUR) 30 MG 24 hr tablet Take 1 tablet (30 mg total) by mouth daily. 90 tablet 3  . losartan (COZAAR) 25 MG tablet Take 1 tablet (25 mg total) by mouth daily. 90 tablet 3  . metFORMIN (GLUCOPHAGE) 500 MG tablet Take 1 tablet (500 mg total) by mouth 2 (two) times daily with a meal. 180 tablet 3  . nitroGLYCERIN (NITROSTAT) 0.4 MG SL tablet Place 1 tablet (0.4 mg total) under the tongue every 5 (five) minutes as needed for chest pain. 25 tablet 3   No facility-administered medications prior to visit.      Allergies:   Patient has no known allergies.   Social History   Social History  . Marital status: Married    Spouse name: N/A  . Number of children: 3  . Years of education: N/A   Social History Main Topics  . Smoking status: Current Some Day Smoker    Last attempt to quit: 10/25/2010  . Smokeless tobacco: Never Used  . Alcohol use Yes     Comment: sts he drank for the first time in a long time last night   . Drug use: No  . Sexual activity: Not on file   Other Topics Concern  . Not on file   Social History Narrative   Lives at home with wife.       Family History:  The patient's ***family history includes CAD (age of onset: 76) in his mother; Cancer in his mother; Heart failure in his father and mother; Kidney failure in his brother.     ROS:   Please see the history of present illness.    ROS All other systems reviewed and are negative.   PHYSICAL EXAM:   VS:  There were no vitals taken for this visit.   GEN: Well nourished, well developed, in no acute distress  HEENT: normal  Neck: no JVD, carotid bruits, or masses Cardiac: ***RRR; no murmurs, rubs, or gallops,no edema  Respiratory:  clear to auscultation  bilaterally, normal work of breathing GI: soft, nontender, nondistended, + BS MS: no deformity or atrophy  Skin: warm and dry, no rash Neuro:  Alert and Oriented x 3, Strength and sensation are intact Psych: euthymic mood, full affect  Wt Readings from Last 3 Encounters:  12/24/15 212 lb 3.2 oz (96.3 kg)  04/17/15 210 lb 6.4 oz (95.4 kg)  04/08/15 216 lb 12.8 oz (98.3 kg)      Studies/Labs Reviewed:   EKG:    EKG is*** ordered today.  The ekg ordered today demonstrates ***  Recent Labs: 04/04/2015: Magnesium 1.7; TSH 1.579 12/23/2015: ALT 12 12/24/2015: BUN 18; Creatinine, Ser 1.14; Hemoglobin 12.4; Platelets 173; Potassium 4.0; Sodium 135   Lipid Panel    Component Value Date/Time   CHOL 154 04/05/2015 0010   TRIG 79 04/05/2015 0010   HDL 32 (L) 04/05/2015 0010   CHOLHDL 4.8 04/05/2015 0010   VLDL 16 04/05/2015 0010   LDLCALC 106 (H) 04/05/2015 0010    Additional studies/ records that were reviewed today include:  ECHOCARDIOGRAM: 04/06/2015 Study Conclusions - Procedure narrative: Transthoracic echocardiography. Image quality was suboptimal. The study was technically difficult, as a result of poor sound wave transmission and body habitus. Intravenous contrast (Definity) was administered. - Left ventricle: The cavity size was normal. There was severe concentric hypertrophy. Systolic function was normal. The estimated ejection fraction was in the range of 50% to 55%. Mild hypokinesis of the inferior myocardium. Doppler parameters are consistent with abnormal left ventricular relaxation (grade 1 diastolic dysfunction). - Aortic valve: Transvalvular velocity was within the normal range. There was no stenosis. There was no regurgitation. - Mitral valve: There was no regurgitation. - Left atrium: The atrium was severely dilated. - Right ventricle: The cavity size was normal. Wall thickness was normal. - Right atrium: The atrium was mildly dilated. -  Tricuspid valve: There was no regurgitation. - Inferior vena cava: The vessel was normal in size.   _____________   Cardiac Catheterization: 03/2015  Ost LAD to Mid LAD lesion, 100% stenosed. The lesion was previously treated with a stent (unknown type).  Mid RCA lesion, 100% stenosed. The lesion was previously treated with a stent (unknown type).  Dist Cx-1 lesion, 95% stenosed. The lesion was previously treated with a stent (unknown type).  Dist Cx-2 lesion, 95% stenosed.  Origin to Prox Graft lesion, 100% stenosed.  Dist LAD lesion, 95% stenosed.  Ost Cx to Mid Cx lesion, 95% stenosed. Post intervention, there is a 0% residual stenosis. The lesion was previously treated with a stent (unknown type).  There is mild left ventricular systolic dysfunction.  IMPRESSION:Mr. Faul has a patient radial graft to an obtuse marginal branch as a patent LIMA to the mid LAD involving the distal LAD is diffusely diseased throughout its entirety. There are left-to-right collaterals. The native right is occluded as is the graft to the PDA and PLA. He does have high grade "in-stent restenosis" within the proximal codominant circumflex which may be his culprit lesion although he has distal disease as well. I was able to stent the proximal circumflex however this was a technically challenging the long procedure using 240 mL of contrast. I elected not to intervene on the distal circumflex at this time but rather treat him medically to assess efficacy of revascularization. The patient remained hemodynamically and electrically stable throughout the procedure. The guidewire and catheter were removed. Sheath was secured. The patient was on IV nitroglycerin and his Angiomax will be continued for 4 additional hours. The sheath will be removed after and pressure held. The patient will be medically for his residual CAD.     ASSESSMENT & PLAN:   NSTEMI: troponin I up to 2.5. ECG with no acute ST/TW  changes. He is pain-free at this point. He has ambulated in the hall as well and did okay. Patient preferred medical management at this time vs further invasive work up. IV heparin discontinued and we have resumed home ASA/plavix, statin, BB and long acting nitrates.  Multivessel CAD s/p CABG in 2009: with subsequent documented graft disease and DES intervention to the native circumflex in 03/2015. As above, resume home medical therapy with close outpatient follow up.   Noncompliance: this is an ongoing issue. Long discussions about the importance of compliance and regular follow up  Essential hypertension: presenting with hypertensive urgency. Improved with restarting home meds. He will be discharged on Coreg, Norvasc, Cozaar and Imdur. Needs BMET at follow up.   HLD: LDL at 106 in 03/2015. Goal LDL < 70. He has been resumed on high potency` statin  Tobacco abuse: counseled on cessation.   DMT2: resume home Metformin     Medication Adjustments/Labs and Tests Ordered: Current medicines are reviewed at length with the patient today.  Concerns regarding medicines are outlined above.  Medication changes, Labs and Tests ordered today are listed in the Patient Instructions below. There are no Patient Instructions on file for this visit.   Signed, Angelena Form, PA-C  12/30/2015 [facility] [place], [place], [place]  [address] Phone: [phone]; Fax: [phone]

## 2016-01-02 ENCOUNTER — Encounter: Payer: Medicare Other | Admitting: Physician Assistant

## 2016-01-03 ENCOUNTER — Inpatient Hospital Stay (HOSPITAL_COMMUNITY)
Admission: EM | Admit: 2016-01-03 | Discharge: 2016-01-04 | DRG: 305 | Disposition: A | Discharge status: Home / Self Care | Payer: Medicare Other | Attending: Internal Medicine | Admitting: Internal Medicine

## 2016-01-03 ENCOUNTER — Encounter (HOSPITAL_COMMUNITY): Payer: Self-pay | Admitting: Emergency Medicine

## 2016-01-03 ENCOUNTER — Emergency Department (HOSPITAL_COMMUNITY): Payer: Medicare Other

## 2016-01-03 DIAGNOSIS — E871 Hypo-osmolality and hyponatremia: Secondary | ICD-10-CM | POA: Diagnosis present

## 2016-01-03 DIAGNOSIS — I25110 Atherosclerotic heart disease of native coronary artery with unstable angina pectoris: Secondary | ICD-10-CM | POA: Diagnosis not present

## 2016-01-03 DIAGNOSIS — I119 Hypertensive heart disease without heart failure: Secondary | ICD-10-CM | POA: Diagnosis present

## 2016-01-03 DIAGNOSIS — F17200 Nicotine dependence, unspecified, uncomplicated: Secondary | ICD-10-CM | POA: Diagnosis present

## 2016-01-03 DIAGNOSIS — E669 Obesity, unspecified: Secondary | ICD-10-CM | POA: Diagnosis present

## 2016-01-03 DIAGNOSIS — Z8249 Family history of ischemic heart disease and other diseases of the circulatory system: Secondary | ICD-10-CM

## 2016-01-03 DIAGNOSIS — Z23 Encounter for immunization: Secondary | ICD-10-CM

## 2016-01-03 DIAGNOSIS — E785 Hyperlipidemia, unspecified: Secondary | ICD-10-CM | POA: Diagnosis present

## 2016-01-03 DIAGNOSIS — E119 Type 2 diabetes mellitus without complications: Secondary | ICD-10-CM

## 2016-01-03 DIAGNOSIS — I2510 Atherosclerotic heart disease of native coronary artery without angina pectoris: Secondary | ICD-10-CM | POA: Diagnosis present

## 2016-01-03 DIAGNOSIS — Z955 Presence of coronary angioplasty implant and graft: Secondary | ICD-10-CM

## 2016-01-03 DIAGNOSIS — I161 Hypertensive emergency: Secondary | ICD-10-CM

## 2016-01-03 DIAGNOSIS — R079 Chest pain, unspecified: Secondary | ICD-10-CM | POA: Diagnosis not present

## 2016-01-03 DIAGNOSIS — R0789 Other chest pain: Secondary | ICD-10-CM | POA: Diagnosis not present

## 2016-01-03 DIAGNOSIS — Z7902 Long term (current) use of antithrombotics/antiplatelets: Secondary | ICD-10-CM

## 2016-01-03 DIAGNOSIS — I160 Hypertensive urgency: Secondary | ICD-10-CM | POA: Diagnosis present

## 2016-01-03 DIAGNOSIS — Z7984 Long term (current) use of oral hypoglycemic drugs: Secondary | ICD-10-CM

## 2016-01-03 DIAGNOSIS — Z91199 Patient's noncompliance with other medical treatment and regimen due to unspecified reason: Secondary | ICD-10-CM

## 2016-01-03 DIAGNOSIS — Z7982 Long term (current) use of aspirin: Secondary | ICD-10-CM

## 2016-01-03 DIAGNOSIS — Z9114 Patient's other noncompliance with medication regimen: Secondary | ICD-10-CM | POA: Diagnosis not present

## 2016-01-03 DIAGNOSIS — Z9119 Patient's noncompliance with other medical treatment and regimen: Secondary | ICD-10-CM | POA: Diagnosis not present

## 2016-01-03 DIAGNOSIS — I252 Old myocardial infarction: Secondary | ICD-10-CM

## 2016-01-03 DIAGNOSIS — Z6827 Body mass index (BMI) 27.0-27.9, adult: Secondary | ICD-10-CM | POA: Diagnosis not present

## 2016-01-03 DIAGNOSIS — Z951 Presence of aortocoronary bypass graft: Secondary | ICD-10-CM | POA: Diagnosis not present

## 2016-01-03 DIAGNOSIS — I10 Essential (primary) hypertension: Secondary | ICD-10-CM | POA: Diagnosis not present

## 2016-01-03 LAB — BASIC METABOLIC PANEL
Anion gap: 7 (ref 5–15)
BUN: 17 mg/dL (ref 6–20)
CALCIUM: 8.6 mg/dL — AB (ref 8.9–10.3)
CHLORIDE: 100 mmol/L — AB (ref 101–111)
CO2: 27 mmol/L (ref 22–32)
CREATININE: 1.18 mg/dL (ref 0.61–1.24)
GFR calc Af Amer: >60 mL/min (ref >60)
GFR calc non Af Amer: >60 mL/min (ref >60)
Glucose, Bld: 192 mg/dL — ABNORMAL HIGH (ref 65–99)
Potassium: 4.8 mmol/L (ref 3.5–5.1)
SODIUM: 134 mmol/L — AB (ref 135–145)

## 2016-01-03 LAB — CBC
HCT: 38.3 % — ABNORMAL LOW (ref 39.0–52.0)
Hemoglobin: 13 g/dL (ref 13.0–17.0)
MCH: 28.4 pg (ref 26.0–34.0)
MCHC: 33.9 g/dL (ref 30.0–36.0)
MCV: 83.8 fL (ref 78.0–100.0)
PLATELETS: 206 10*3/uL (ref 150–400)
RBC: 4.57 MIL/uL (ref 4.22–5.81)
RDW: 14.1 % (ref 11.5–15.5)
WBC: 6 10*3/uL (ref 4.0–10.5)

## 2016-01-03 LAB — I-STAT TROPONIN, ED: TROPONIN I, POC: 0.01 ng/mL (ref 0.00–0.08)

## 2016-01-03 LAB — GLUCOSE, CAPILLARY
GLUCOSE-CAPILLARY: 129 mg/dL — AB (ref 65–99)
Glucose-Capillary: 128 mg/dL — ABNORMAL HIGH (ref 65–99)
Glucose-Capillary: 155 mg/dL — ABNORMAL HIGH (ref 65–99)
Glucose-Capillary: 138 mg/dL — ABNORMAL HIGH (ref 65–99)

## 2016-01-03 LAB — TROPONIN I
TROPONIN I: 0.03 ng/mL — AB (ref <0.03)
TROPONIN I: 0.04 ng/mL — AB (ref <0.03)
Troponin I: <0.03 ng/mL (ref <0.03)

## 2016-01-03 LAB — MRSA PCR SCREENING: MRSA by PCR: NEGATIVE

## 2016-01-03 MED ORDER — CARVEDILOL 25 MG PO TABS: 25.0000 mg | ORAL_TABLET | Freq: Two times a day (BID) | ORAL | Status: DC

## 2016-01-03 MED ORDER — AMLODIPINE BESYLATE 5 MG PO TABS
10.0000 mg | ORAL_TABLET | Freq: Once | ORAL | Status: AC
  Administered 2016-01-03: 10 mg via ORAL
  Filled 2016-01-03: qty 2

## 2016-01-03 MED ORDER — GI COCKTAIL ~~LOC~~
30.0000 mL | Freq: Four times a day (QID) | ORAL | Status: DC | PRN
  Filled 2016-01-03: qty 30

## 2016-01-03 MED ORDER — ATORVASTATIN CALCIUM 80 MG PO TABS
80.0000 mg | ORAL_TABLET | Freq: Every day | ORAL | Status: DC
  Administered 2016-01-03 – 2016-01-04 (×2): 80 mg via ORAL
  Filled 2016-01-03 (×2): qty 1

## 2016-01-03 MED ORDER — INFLUENZA VAC SPLIT QUAD 0.5 ML IM SUSY
0.5000 mL | PREFILLED_SYRINGE | INTRAMUSCULAR | Status: AC
  Administered 2016-01-04: 0.5 mL via INTRAMUSCULAR
  Filled 2016-01-03: qty 0.5

## 2016-01-03 MED ORDER — NITROGLYCERIN IN D5W 200-5 MCG/ML-% IV SOLN
0.0000 ug/min | Freq: Once | INTRAVENOUS | Status: AC
  Administered 2016-01-03: 5 ug/min via INTRAVENOUS
  Filled 2016-01-03: qty 250

## 2016-01-03 MED ORDER — LOSARTAN POTASSIUM 50 MG PO TABS
25.0000 mg | ORAL_TABLET | Freq: Every day | ORAL | Status: DC
  Administered 2016-01-03 – 2016-01-04 (×2): 25 mg via ORAL
  Filled 2016-01-03 (×2): qty 1

## 2016-01-03 MED ORDER — NITROGLYCERIN IN D5W 200-5 MCG/ML-% IV SOLN: 0.0000 ug/min | Freq: Once | INTRAVENOUS | Status: DC

## 2016-01-03 MED ORDER — HYDRALAZINE HCL 20 MG/ML IJ SOLN
5.0000 mg | Freq: Once | INTRAMUSCULAR | Status: AC
  Administered 2016-01-03: 5 mg via INTRAVENOUS
  Filled 2016-01-03: qty 1

## 2016-01-03 MED ORDER — ONDANSETRON HCL 4 MG/2ML IJ SOLN: 4.0000 mg | Freq: Four times a day (QID) | INTRAMUSCULAR | Status: DC | PRN

## 2016-01-03 MED ORDER — NITROGLYCERIN 2 % TD OINT
1.0000 [in_us] | TOPICAL_OINTMENT | Freq: Once | TRANSDERMAL | Status: AC
  Administered 2016-01-03: 1 [in_us] via TOPICAL
  Filled 2016-01-03: qty 1

## 2016-01-03 MED ORDER — ENOXAPARIN SODIUM 40 MG/0.4ML ~~LOC~~ SOLN
40.0000 mg | SUBCUTANEOUS | Status: DC
  Administered 2016-01-03 – 2016-01-04 (×2): 40 mg via SUBCUTANEOUS
  Filled 2016-01-03 (×2): qty 0.4

## 2016-01-03 MED ORDER — INSULIN ASPART 100 UNIT/ML ~~LOC~~ SOLN
0.0000 [IU] | SUBCUTANEOUS | Status: DC
  Administered 2016-01-03 (×3): 2 [IU] via SUBCUTANEOUS

## 2016-01-03 MED ORDER — CLOPIDOGREL BISULFATE 75 MG PO TABS
75.0000 mg | ORAL_TABLET | Freq: Every day | ORAL | Status: DC
  Administered 2016-01-03 – 2016-01-04 (×2): 75 mg via ORAL
  Filled 2016-01-03 (×2): qty 1

## 2016-01-03 MED ORDER — AMLODIPINE BESYLATE 10 MG PO TABS
10.0000 mg | ORAL_TABLET | Freq: Every day | ORAL | Status: DC
  Administered 2016-01-04: 10 mg via ORAL
  Filled 2016-01-03: qty 1

## 2016-01-03 MED ORDER — CARVEDILOL 25 MG PO TABS
25.0000 mg | ORAL_TABLET | Freq: Two times a day (BID) | ORAL | Status: DC
  Administered 2016-01-03 – 2016-01-04 (×3): 25 mg via ORAL
  Filled 2016-01-03 (×3): qty 1

## 2016-01-03 MED ORDER — ACETAMINOPHEN 325 MG PO TABS: 650.0000 mg | ORAL_TABLET | ORAL | Status: DC | PRN

## 2016-01-03 MED ORDER — ISOSORBIDE MONONITRATE ER 30 MG PO TB24
30.0000 mg | ORAL_TABLET | Freq: Every day | ORAL | Status: DC
  Administered 2016-01-03 – 2016-01-04 (×2): 30 mg via ORAL
  Filled 2016-01-03 (×2): qty 1

## 2016-01-03 MED ORDER — ASPIRIN EC 81 MG PO TBEC
81.0000 mg | DELAYED_RELEASE_TABLET | Freq: Every day | ORAL | Status: DC
  Administered 2016-01-03 – 2016-01-04 (×2): 81 mg via ORAL
  Filled 2016-01-03 (×2): qty 1

## 2016-01-03 NOTE — ED Notes (Signed)
EDP ( Dr. Wilkie AyeHorton ) notified on pt.'s recurring central chest pain  , EKG repeated and reviewed by EDP . Respirations unlabored .

## 2016-01-03 NOTE — H&P (Signed)
History and Physical  Patient Name: Darrell [name]     FTD:[id number]    DOB: [birth year]    DOA: 01/03/2016 PCP: No PCP Per Patient   Patient coming from: Home     Chief Complaint: Chest pain  HPI: Darrell [name] is a 60 y.o. male with a past medical history significant for CAD s/p CABG in 2009, DES to LCx in 03/2015, NIDDM, and HTN who presents with chest pain.  The patient was recently discharged after NSTEMI treated medically.  Since discharge, he went to "the pharmacy" where he was told his Plavix was >$400 and blood pressure medicines were >$100, and because he has traditional Medicare without a part D plan, he couldn't afford any of them and has not been taking any blood pressure meds or Plavix.  Tonight, he was lying down when he had onset of severe sharp central chest pain.  This did not improve with one baby aspirin so he called EMS who administered additional 240 mg aspirin as well as SL nitro which resolved his pain.   ED course: -Afebrile, heart rate 94, respiration pulse ox normal, blood pressure 195/116 -Initial ECG showed sinus rhythm with old TWI and troponin was negative. -Na 134, K 4.8, Cr 1.18 (baseline 1.1), WBC 6, Hgb 13 -CXR showed stable cardiomegaly -He was given amlodipine and hydralazine and his blood pressure improved -His chest pain subsequently returned and he was started on nitro gtt -The case was discussed with Cardiology who recommended hold heparin, continue nitro gtt, trend troponin, and AM Cardiology consult   Patient had CABG in 2009.   STEMI in 2014. This is the patient's second admission this month and 5th admission this year for hypertensive urgency and/or NSTEMI.  In March he was admitted with elevated troponin, underwent LHC that showed diffuse disease, underwent a technically difficult LCx DES.   Review of Systems:  Review of Systems  Cardiovascular: Positive for chest pain.  All other systems reviewed and are negative.    Past  Medical History:  Diagnosis Date  . Claudication (HCC)   . Coronary artery disease    a. 08/2007 s/p DES to RCA/RI;  b. 07/2006 s/p DES to p/d LCX;  c. 11/2007 CABGx5 (LIMA->LAD, VG->D1, LRA->OM1, VG->PDA->LPL;  d. 09/2012 STEMI/Cath: LM nl, LAD 100p, LCX 70p/90d, OM1 95, LPL 90d, RCA 100, LIMA->LAD nl (LAD 95d), RA->OM nl, VG->PDA->LPL 100, unable to locate VG->Diag, EF 60-65%->Med Rx. e. 03/2015: DES to mid-Cx in the setting of NSTEMI  . Diabetes mellitus without complication (HCC)    a. A1c 7.0 09/2012.  Marland Kitchen Hyperlipidemia   . Hypertension   . Noncompliance     Past Surgical History:  Procedure Laterality Date  . CARDIAC CATHETERIZATION N/A 04/06/2015   Procedure: Left Heart Cath and Cors/Grafts Angiography;  Surgeon: Runell Gess, MD;  Location: [facility] INVASIVE CV LAB;  Service: Cardiovascular;  Laterality: N/A;  . CARDIAC CATHETERIZATION N/A 04/06/2015   Procedure: Coronary Stent Intervention;  Surgeon: Runell Gess, MD;  2.5 mm x 12 mm long Synergy DES followed by  2.5 mm x 16 mm long Synergy DES    . CORONARY ARTERY BYPASS GRAFT     2009 LIMA to LAD, SVG to Diag, SVG to PDA and PL, left radial to OM  . LEFT HEART CATH N/A 10/24/2012   Procedure: LEFT HEART CATH;  Surgeon: Lennette Bihari, MD;  Location: [facility] CATH LAB;  Service: Cardiovascular;  Laterality: N/A;    Social History: Patient lives with  his wife and son.  Patient walks unassisted.  He is a disabled Astronomer.  He does not smoke anymore.    No Known Allergies  Family history: family history includes CAD (age of onset: 44) in his mother; Cancer in his mother; Heart failure in his father and mother; Kidney failure in his brother.  Prior to Admission medications   Medication Sig Start Date End Date Taking? Authorizing Provider  aspirin EC 81 MG EC tablet Take 1 tablet (81 mg total) by mouth daily. 10/28/12  Yes Rogelia Mire, NP  nitroGLYCERIN (NITROSTAT) 0.4 MG SL tablet Place 1 tablet (0.4 mg total) under the tongue  every 5 (five) minutes as needed for chest pain. 04/17/15  Yes Rhonda G Barrett, PA-C  amLODipine (NORVASC) 10 MG tablet Take 1 tablet (10 mg total) by mouth daily. 12/24/15   Eileen Stanford, PA-C  atorvastatin (LIPITOR) 80 MG tablet Take 1 tablet (80 mg total) by mouth daily. 12/24/15   Eileen Stanford, PA-C  carvedilol (COREG) 25 MG tablet Take 1 tablet (25 mg total) by mouth 2 (two) times daily with a meal. 12/24/15   Eileen Stanford, PA-C  clopidogrel (PLAVIX) 75 MG tablet Take 1 tablet (75 mg total) by mouth daily. 12/24/15   Eileen Stanford, PA-C  glucose monitoring kit (FREESTYLE) monitoring kit 1 each by Does not apply route once. Glucometer with test strips and lancets to check blood glucose BID ac breakfast and dinner. 10/28/12   Rogelia Mire, NP  isosorbide mononitrate (IMDUR) 30 MG 24 hr tablet Take 1 tablet (30 mg total) by mouth daily. 12/24/15   Eileen Stanford, PA-C  losartan (COZAAR) 25 MG tablet Take 1 tablet (25 mg total) by mouth daily. 12/25/15   Eileen Stanford, PA-C  metFORMIN (GLUCOPHAGE) 500 MG tablet Take 1 tablet (500 mg total) by mouth 2 (two) times daily with a meal. 12/24/15   Eileen Stanford, PA-C       Physical Exam: BP 121/75   Pulse 89   Temp 98 F (36.7 C) (Oral)   Resp 20   Ht '6\' 1"'$  (1.854 m)   Wt 96.2 kg (212 lb)   SpO2 96%   BMI 27.97 kg/m  General appearance: Well-developed, adult male, alert and in no acute distress.   Eyes: Anicteric, conjunctiva pink, lids and lashes normal.     ENT: No nasal deformity, discharge, or epistaxis.  OP moist without lesions.   Skin: Warm and dry.   Cardiac: RRR, nl S1-S2, no murmurs appreciated.  Capillary refill is brisk.  JVP not visible, no JVD.  No LE edema.  Radial pulses 2+ and symmetric.  No carotid bruits. Respiratory: Normal respiratory rate and rhythm.  CTAB without rales or wheezes. GI: Abdomen soft without rigidity.  No TTP. No ascites, distension.   MSK: No deformities or  effusions.   Pain not reproduced with palpation of precordium.  No pain with arm movement. Neuro: Sensorium intact and responding to questions, attention normal.  Speech is fluent.  Moves all extremities equally and with normal coordination.    Psych: Behavior appropriate.  Affect blunted.  No evidence of aural or visual hallucinations or delusions.   Appears to have some cognitive deficits.    Labs on Admission:  The metabolic panel shows mild hyponatremia. The complete blood count shows normal counts. The initial troponin is negative.  Radiological Exams on Admission: Personally reviewed: Dg Chest 2 View  Result Date: 01/03/2016 CLINICAL DATA:  Chest  pain tonight. EXAM: CHEST  2 VIEW COMPARISON:  12/22/2015 FINDINGS: Unchanged mild cardiomegaly.  Prior sternotomy and CABG. The lungs are clear. There is no pleural effusion. The pulmonary vasculature is normal. Hilar and mediastinal contours are unremarkable and unchanged. IMPRESSION: Stable cardiomegaly.  No acute findings. Electronically Signed   By: Andreas Newport M.D.   On: 01/03/2016 02:57    EKG: Independently reviewed. Rate 89, QTc 502, inferior TWI are old, similar to March.  Echocardiogram March 2017: EF 50-55% Grade I [facility] abnormality       Assessment/Plan  1. Chest pain from hypertension: -Continue nitroglycerin drip for now, titrate down as able -Continue amlodipine -Restart carvedilol, Imdur, losartan -Serial troponins are ordered -Telemetry -Consult to cardiology, appreciate recommendations  -Consult to CM; patient appears to have some cognitive delay, may need extra resources for medication assistance, possible advice re: Part D plans  2. Coronary disease:  -Restart Plavix -Continue statin, aspirin  3. NIDDM:  -Hold metformin -SSI with meals      DVT prophylaxis: Lovenox Diet: NPO  Code Status: FULL  Family Communication: None present  Disposition Plan: Anticipate overnight  observation for arrhythmia on telemetry, serial troponins and subsequent risk stratification by Cardiology.  If testing negative, home after. Consults called: Cardiology Admission status: Telemetry, OBS   Medical decision making: Patient seen at 4:50 AM on 01/03/2016.  The patient was discussed with Dr. Dina Rich. What exists of the patient's chart was reviewed in depth.  Clinical condition: stable.      Edwin Dada [facility] Pager [phone]

## 2016-01-03 NOTE — Consult Note (Signed)
Cardiology Consult    Patient ID: Darrell [name] MRN: [id number], DOB/AGE: 59/12/1955   Admit date: 01/03/2016 Date of Consult: 01/03/2016  Primary Physician: No PCP Per Patient Reason for Consult: chest pain, hypertensive urgency Primary Cardiologist: [name], although he has never been seen in the outpt. Setting.  Requesting Provider: Dr. Susie Cassette   Patient Profile    Darrell Krone McIntyreis a 60 y.o.malewith past medical history of CAD (s/p CABG in 11/2007 w/ LIMA-LAD, SVG-D1, LRA-OM1, SVG-PDA, known occlusion of SVG-D1 and occlusion of SVG-PDA-PLA, and recent DES to mid-LCx in 03/2015), HTN, HLD, Type 2 DM and non compliance who presented to [facility] ED on 01/03/16 for evaluation of chest pain in the setting of hypertensive urgency.  History of Present Illness    His last cardiac catheterization was in 03/2015 in the setting of an NSTEMI. Troponin values peaked at 9.51. Cath at that time showed known 3-vessel CAD with patent LRA-OM1 and patent LIMA-mid LAD with diffuse disease along distal LAD. Occluded SVG-PDA-PLA was noted. He did have high grade in-stent restenosis within the proximal codominant circumflex with 2 DES being placed at that time. There was 95% stenosis in the distalCx but this was not treated at that time due to the small size of the distal vessel and prolonged contrast use of due to the complexity of his PCI to pCx.   He presented to the ED on 01/03/16 with chest pain and was hypertensive after being non compliant with his meds. Chest pain was severe sharp pain, he took a 81mg  ASA and pain did not resolve, so he called EMS. He was given additional ASA and a SL Nitro which helped his pain. His BP was 195/116 when he arrived. EKG showed NSR with T wave inversion in his inferolateral leads.   He was started on a Nitro gtt, his carvedilol, Imdur, losartan and amlodipine were restarted. BP improving. He has a history of noncompliance, and this is his second admission  with hypertensive urgency. He was admitted from 12/22/15-12/24/15 with hypertensive urgency and troponin peaked at 2.5, he was chest pain free and the patient preferred to be managed conservatively.   He had a vagal episode earlier today. Nursing reports that he was briefly unarousable but never lost a pulse.    Past Medical History   Past Medical History:  Diagnosis Date  . Claudication (HCC)   . Coronary artery disease    a. 08/2007 s/p DES to RCA/RI;  b. 07/2006 s/p DES to p/d LCX;  c. 11/2007 CABGx5 (LIMA->LAD, VG->D1, LRA->OM1, VG->PDA->LPL;  d. 09/2012 STEMI/Cath: LM nl, LAD 100p, LCX 70p/90d, OM1 95, LPL 90d, RCA 100, LIMA->LAD nl (LAD 95d), RA->OM nl, VG->PDA->LPL 100, unable to locate VG->Diag, EF 60-65%->Med Rx. e. 03/2015: DES to mid-Cx in the setting of NSTEMI  . Diabetes mellitus without complication (HCC)    a. A1c 7.0 09/2012.  Marland Kitchen Hyperlipidemia   . Hypertension   . Noncompliance     Past Surgical History:  Procedure Laterality Date  . CARDIAC CATHETERIZATION N/A 04/06/2015   Procedure: Left Heart Cath and Cors/Grafts Angiography;  Surgeon: Runell Gess, MD;  Location: [facility] INVASIVE CV LAB;  Service: Cardiovascular;  Laterality: N/A;  . CARDIAC CATHETERIZATION N/A 04/06/2015   Procedure: Coronary Stent Intervention;  Surgeon: Runell Gess, MD;  2.5 mm x 12 mm long Synergy DES followed by  2.5 mm x 16 mm long Synergy DES    . CORONARY ARTERY BYPASS GRAFT  2009 LIMA to LAD, SVG to Diag, SVG to PDA and PL, left radial to OM  . LEFT HEART CATH N/A 10/24/2012   Procedure: LEFT HEART CATH;  Surgeon: Lennette Bihari, MD;  Location: [facility] CATH LAB;  Service: Cardiovascular;  Laterality: N/A;     Allergies  No Known Allergies  Inpatient Medications    . [START ON 01/04/2016] amLODipine  10 mg Oral Daily  . aspirin EC  81 mg Oral Daily  . atorvastatin  80 mg Oral Daily  . carvedilol  25 mg Oral BID WC  . clopidogrel  75 mg Oral Daily  . enoxaparin (LOVENOX) injection  40 mg  Subcutaneous Q24H  . [START ON 01/04/2016] Influenza vac split quadrivalent PF  0.5 mL Intramuscular Tomorrow-1000  . insulin aspart  0-15 Units Subcutaneous Q4H  . isosorbide mononitrate  30 mg Oral Daily  . losartan  25 mg Oral Daily  . nitroGLYCERIN  0-200 mcg/min Intravenous Once    Family History    Family History  Problem Relation Age of Onset  . Heart failure Mother   . Cancer Mother   . CAD Mother 79  . Heart failure Father   . Kidney failure Brother     Social History    Social History   Social History  . Marital status: Married    Spouse name: N/A  . Number of children: 3  . Years of education: N/A   Occupational History  . Not on file.   Social History Main Topics  . Smoking status: Current Some Day Smoker    Last attempt to quit: 10/25/2010  . Smokeless tobacco: Never Used  . Alcohol use Yes     Comment: sts he drank for the first time in a long time last night   . Drug use: No  . Sexual activity: Not on file   Other Topics Concern  . Not on file   Social History Narrative   Lives at home with wife.       Review of Systems    General:  No chills, fever, night sweats or weight changes.  Cardiovascular:  No chest pain, dyspnea on exertion, edema, orthopnea, palpitations, paroxysmal nocturnal dyspnea. Dermatological: No rash, lesions/masses Respiratory: No cough, dyspnea Urologic: No hematuria, dysuria Abdominal:   No nausea, vomiting, diarrhea, bright red blood per rectum, melena, or hematemesis Neurologic:  No visual changes, wkns, changes in mental status. All other systems reviewed and are otherwise negative except as noted above.  Physical Exam    Blood pressure 114/73, pulse 79, temperature 97.5 F (36.4 C), temperature source Oral, resp. rate 19, height 6\' 1"  (1.854 m), weight 202 lb 6.1 oz (91.8 kg), SpO2 96 %.  General: Flat affect, obese male  Psych: Flat affect  Neuro: Alert and oriented X 3. Moves all extremities  spontaneously. HEENT: Normal  Neck: Supple without bruits or JVD. Lungs:  Resp regular and unlabored, CTA. Heart: RRR no s3, s4, or murmurs. Abdomen: Soft, non-tender, non-distended, BS + x 4.  Extremities: No clubbing, cyanosis or edema. DP/PT/Radials 2+ and equal bilaterally.  Labs    Troponin [facility] of Care Test)  Recent Labs  01/03/16 0303  TROPIPOC 0.01    Recent Labs  01/03/16 0732 01/03/16 0922  TROPONINI <0.03 0.03*   Lab Results  Component Value Date   WBC 6.0 01/03/2016   HGB 13.0 01/03/2016   HCT 38.3 (L) 01/03/2016   MCV 83.8 01/03/2016   PLT 206 01/03/2016    Recent  Labs Lab 01/03/16 0157  NA 134*  K 4.8  CL 100*  CO2 27  BUN 17  CREATININE 1.18  CALCIUM 8.6*  GLUCOSE 192*   Lab Results  Component Value Date   CHOL 154 04/05/2015   HDL 32 (L) 04/05/2015   LDLCALC 106 (H) 04/05/2015   TRIG 79 04/05/2015     Radiology Studies    Dg Chest 2 View  Result Date: 01/03/2016 CLINICAL DATA:  Chest pain tonight. EXAM: CHEST  2 VIEW COMPARISON:  12/22/2015 FINDINGS: Unchanged mild cardiomegaly.  Prior sternotomy and CABG. The lungs are clear. There is no pleural effusion. The pulmonary vasculature is normal. Hilar and mediastinal contours are unremarkable and unchanged. IMPRESSION: Stable cardiomegaly.  No acute findings. Electronically Signed   By: Ellery Plunkaniel R Mitchell M.D.   On: 01/03/2016 02:57   Dg Chest 2 View  Result Date: 12/22/2015 CLINICAL DATA:  Midsternal chest pain. EXAM: CHEST  2 VIEW COMPARISON:  04/16/2015 FINDINGS: Postsurgical changes from CABG is stable. Cardiomediastinal silhouette is mildly enlarged. Mediastinal contours appear intact. There is no evidence of focal airspace consolidation, pleural effusion or pneumothorax. Osseous structures are without acute abnormality. Soft tissues are grossly normal. IMPRESSION: Mildly enlarged cardiac silhouette, otherwise no active cardiopulmonary disease. Electronically Signed   By: Ted Mcalpineobrinka   Dimitrova M.D.   On: 12/22/2015 14:04    EKG & Cardiac Imaging    EKG: Sinus tach   Echocardiogram: 04/06/15 Study Conclusions  - Procedure narrative: Transthoracic echocardiography. Image   quality was suboptimal. The study was technically difficult, as a   result of poor sound wave transmission and body habitus.   Intravenous contrast (Definity) was administered. - Left ventricle: The cavity size was normal. There was severe   concentric hypertrophy. Systolic function was normal. The   estimated ejection fraction was in the range of 50% to 55%. Mild   hypokinesis of the inferior myocardium. Doppler parameters are   consistent with abnormal left ventricular relaxation (grade 1   diastolic dysfunction). - Aortic valve: Transvalvular velocity was within the normal range.   There was no stenosis. There was no regurgitation. - Mitral valve: There was no regurgitation. - Left atrium: The atrium was severely dilated. - Right ventricle: The cavity size was normal. Wall thickness was   normal. - Right atrium: The atrium was mildly dilated. - Tricuspid valve: There was no regurgitation. - Inferior vena cava: The vessel was normal in size.  Assessment & Plan    1. Hypertensive urgency: In the setting of medication non compliance. Continue current management.  - Continue amlodipine -Restart carvedilol, Imdur, losartan -Serial troponins within normal limits  2. Sinus pause: Likely vagal response. Stable now, continue tele.   3. History of coronary artery disease: Restart Plavix, and statin. Patient was not taking. Case management looking into cost.   Signed, Little IshikawaErin E Smith, NP 01/03/2016, 11:51 AM Pager: [phone]  I have seen and examined the patient along with Little IshikawaErin E Smith, NP.  I have reviewed the chart, notes and new data.  I agree with NP's note.  Key new complaints: no longer has chest discomfort, episode of bradycardia/junctional escape rhythm clearly consistent with a  vasovagal event Key examination changes: no overt hypervolemia or active arrhythmia at time of exam Key new findings / data: low risk ECG (Sinus tachycardia, LVH) and cardiac enzymes (peak troponin 0.04)  PLAN: Very low suspicion for a new acute coronary event. Vasovagal event earlier today. The biggest challenge will be identifying a regimen of medications  that he can afford and be compliant with. Most important agents are aspirin, clopidogrel, carvedilol, amlodipine and statin  Thurmon FairMihai Sanjuan Sawa, MD, [facility]ACC [facility] [phone] 01/03/2016, 3:13 PM

## 2016-01-03 NOTE — Progress Notes (Addendum)
Pt had arrhythmias - SB, pause up to 3.80 seconds, JR back to NSR - Cardiology has not yet consulted on this patient - Paged Cards through Am[facility] to see pt and make aware of arrhythmias BP stable and 02 sat 95% - placed on 2 L n/c

## 2016-01-03 NOTE — ED Notes (Signed)
Dr. Maryfrances Bunnellanford ( admitting MD ) at bedside evaluating pt. , NTG drip infusing at 20 mcg/min . IV sites unremarkable . Respirations unlabored .

## 2016-01-03 NOTE — Progress Notes (Signed)
Katie NP returned text page & made aware of arrhythmias - says pt is on the list to be seen today also noted that he had a office appointment yesterday that he did not show up for

## 2016-01-03 NOTE — ED Notes (Signed)
Attempted to call report

## 2016-01-03 NOTE — Plan of Care (Signed)
Problem: Education: Goal: Knowledge of [facility] Education information/materials will improve Outcome: Progressing Primary nurse discussing plan of care with other staff in the room to the patient with some teachback displayed

## 2016-01-03 NOTE — ED Notes (Signed)
Dr.Danford removed NTG paste and advised nurse to reduce NTG drip 10 mcg/min.

## 2016-01-03 NOTE — ED Notes (Signed)
Dr. Wilkie AyeHorton ( EDP ) notified on pt.'s persistent hypertension .

## 2016-01-03 NOTE — ED Triage Notes (Addendum)
Pt. arrived with EMS from home reports central chest pain onset this evening , received ASA 324 mg and 2 NTG sl by EMS prior to arrival with relief , denies chest pain at arrival . Pt. added that he can not afford his medications due to financial constraints. Hypertensive at arrival .

## 2016-01-03 NOTE — Progress Notes (Signed)
Patient seen and examined  Currently chest pain-free Noted to have 3.8 second pause this morning Still on nitroglycerin drip  Darrell GunningSteven S [name] is a 60 y.o. male with a past medical history significant for CAD s/p CABG in 2009, DES to LCx in 03/2015, NIDDM, and HTN who presents with chest pain. Patient was discharged by cardiology on 11/26 after having non-ST elevation MI. Patient has been noncompliant with home medications. Presented  with chest pain 12/6  Assessment and plan 1. Chest pain in the setting of hypertensive urgency -Continue nitroglycerin drip for now, titrate down as able -Continue amlodipine -Restart carvedilol, Imdur, losartan -Serial troponins within normal limits -Telemetry-sinus pause Cardiology recommendations pending  -Consult to CM; patient appears to have some cognitive delay, may need extra resources for medication assistance, possible advice re: Part D plans  2. Coronary disease:  -Restart Plavix -Continue statin, aspirin  3. NIDDM:  -Hold metformin -SSI with meals

## 2016-01-03 NOTE — ED Provider Notes (Signed)
[facility] DEPT Provider Note   CSN: [id number] Arrival date & time: 01/03/16  0155  By signing my name below, I, Arianna Nassar, attest that this documentation has been prepared under the direction and in the presence of Merryl Hacker, MD.  Electronically Signed: Julien Nordmann, ED Scribe. 01/03/16. 2:41 AM.    History   Chief Complaint Chief Complaint  Patient presents with  . Chest Pain    The history is provided by the patient. No language interpreter was used.   HPI Comments: Darrell [name] is a 60 y.o. male who has a PMhx of CAD, DM, NSTEMI, HLD, and HTN presents to the Emergency Department by EMS complaining of intermittent, resolved, sharp, central chest pain x this evening. He notes that he has had similar pain in the past. Pt was admitted and discharged on 11/24 for elevated troponin and had two stents placed. He received 2 nitro and 4 ASA by EMS which resolved his chest pain completely. He states he has not been able to fill in his medications.  Denies fever, cough, diaphoresis, and shortness of breath.  On chart review, patient's last hospitalization was one week ago. He was admitted to the cardiology service. He had an in STEMI. Medical management preferred. Last cardiac catheterization was in March. At that time he had stent placement. It was difficult. He has diffuse disease and in-stent stenosis.   Past Medical History:  Diagnosis Date  . Claudication ([place])   . Coronary artery disease    a. 08/2007 s/p DES to RCA/RI;  b. 07/2006 s/p DES to p/d LCX;  c. 11/2007 CABGx5 (LIMA->LAD, VG->D1, LRA->OM1, VG->PDA->LPL;  d. 09/2012 STEMI/Cath: LM nl, LAD 100p, LCX 70p/90d, OM1 95, LPL 90d, RCA 100, LIMA->LAD nl (LAD 95d), RA->OM nl, VG->PDA->LPL 100, unable to locate VG->Diag, EF 60-65%->Med Rx. e. 03/2015: DES to mid-Cx in the setting of NSTEMI  . Diabetes mellitus without complication ([place])    a. A1c 7.0 09/2012.  Marland Kitchen Hyperlipidemia   . Hypertension   . Noncompliance      Patient Active Problem List   Diagnosis Date Noted  . NSTEMI (non-ST elevated myocardial infarction) ([place]) 04/04/2015  . HTN (hypertension) 04/04/2015  . Hypertensive urgency 03/25/2014  . Noncompliance 03/25/2014  . Claudication ([place]) 10/28/2012  . GERD 10/23/2007  . NEPHROLITHIASIS 10/23/2007  . HYPERCHOLESTEROLEMIA 10/22/2007  . ADJUSTMENT DISORDER WITH DEPRESSED MOOD 10/22/2007  . HYPERTENSION, BENIGN ESSENTIAL 10/22/2007  . Diabetes mellitus ([place]) 01/29/2003  . Coronary atherosclerosis, s/p CABG in 2009  01/28/2001    Past Surgical History:  Procedure Laterality Date  . CARDIAC CATHETERIZATION N/A 04/06/2015   Procedure: Left Heart Cath and Cors/Grafts Angiography;  Surgeon: Lorretta Harp, MD;  Location: [facility];  Service: Cardiovascular;  Laterality: N/A;  . CARDIAC CATHETERIZATION N/A 04/06/2015   Procedure: Coronary Stent Intervention;  Surgeon: Lorretta Harp, MD;  2.5 mm x 12 mm long Synergy DES followed by  2.5 mm x 16 mm long Synergy DES    . CORONARY ARTERY BYPASS GRAFT     2009 LIMA to LAD, SVG to Diag, SVG to PDA and PL, left radial to OM  . LEFT HEART CATH N/A 10/24/2012   Procedure: LEFT HEART CATH;  Surgeon: Troy Sine, MD;  Location: [facility];  Service: Cardiovascular;  Laterality: N/A;       Home Medications    Prior to Admission medications   Medication Sig Start Date End Date Taking? Authorizing Provider  aspirin EC 81  MG EC tablet Take 1 tablet (81 mg total) by mouth daily. 10/28/12  Yes Rogelia Mire, NP  nitroGLYCERIN (NITROSTAT) 0.4 MG SL tablet Place 1 tablet (0.4 mg total) under the tongue every 5 (five) minutes as needed for chest pain. 04/17/15  Yes Rhonda G Barrett, PA-C  amLODipine (NORVASC) 10 MG tablet Take 1 tablet (10 mg total) by mouth daily. 12/24/15   Eileen Stanford, PA-C  atorvastatin (LIPITOR) 80 MG tablet Take 1 tablet (80 mg total) by mouth daily. 12/24/15   Eileen Stanford, PA-C  carvedilol (COREG)  25 MG tablet Take 1 tablet (25 mg total) by mouth 2 (two) times daily with a meal. 12/24/15   Eileen Stanford, PA-C  clopidogrel (PLAVIX) 75 MG tablet Take 1 tablet (75 mg total) by mouth daily. 12/24/15   Eileen Stanford, PA-C  glucose monitoring kit (FREESTYLE) monitoring kit 1 each by Does not apply route once. Glucometer with test strips and lancets to check blood glucose BID ac breakfast and dinner. 10/28/12   Rogelia Mire, NP  isosorbide mononitrate (IMDUR) 30 MG 24 hr tablet Take 1 tablet (30 mg total) by mouth daily. 12/24/15   Eileen Stanford, PA-C  losartan (COZAAR) 25 MG tablet Take 1 tablet (25 mg total) by mouth daily. 12/25/15   Eileen Stanford, PA-C  metFORMIN (GLUCOPHAGE) 500 MG tablet Take 1 tablet (500 mg total) by mouth 2 (two) times daily with a meal. 12/24/15   Eileen Stanford, PA-C    Family History Family History  Problem Relation Age of Onset  . Heart failure Mother   . Cancer Mother   . CAD Mother 3  . Heart failure Father   . Kidney failure Brother     Social History Social History  Substance Use Topics  . Smoking status: Current Some Day Smoker    Last attempt to quit: 10/25/2010  . Smokeless tobacco: Never Used  . Alcohol use Yes     Comment: sts he drank for the first time in a long time last night      Allergies   Patient has no known allergies.   Review of Systems Review of Systems  Constitutional: Negative for diaphoresis and fever.  Respiratory: Negative for cough and shortness of breath.   Cardiovascular: Positive for chest pain.  Gastrointestinal: Negative for abdominal pain, nausea and vomiting.  All other systems reviewed and are negative.    Physical Exam Updated Vital Signs BP 154/96   Pulse 119   Temp 98 F (36.7 C) (Oral)   Resp 11   Ht '6\' 1"'$  (1.854 m)   Wt 212 lb (96.2 kg)   SpO2 96%   BMI 27.97 kg/m   Physical Exam  Constitutional: He is oriented to person, place, and time. No distress.   Disheveled, no acute distress  HENT:  Head: Normocephalic and atraumatic.  Eyes: Pupils are equal, round, and reactive to light.  Cardiovascular: Normal rate, regular rhythm and normal heart sounds.   No murmur heard. Pulmonary/Chest: Effort normal and breath sounds normal. No respiratory distress. He has no wheezes.  Abdominal: Soft. Bowel sounds are normal. There is no tenderness. There is no rebound.  Musculoskeletal: He exhibits edema.  Trace bilateral lower extremity edema  Neurological: He is alert and oriented to person, place, and time.  Skin: Skin is warm and dry.  Psychiatric: He has a normal mood and affect.  Nursing note and vitals reviewed.    ED Treatments / Results  DIAGNOSTIC STUDIES: Oxygen Saturation is 99% on RA, normal by my interpretation.  COORDINATION OF CARE:    Labs (all labs ordered are listed, but only abnormal results are displayed) Labs Reviewed  BASIC METABOLIC PANEL - Abnormal; Notable for the following:       Result Value   Sodium 134 (*)    Chloride 100 (*)    Glucose, Bld 192 (*)    Calcium 8.6 (*)    All other components within normal limits  CBC - Abnormal; Notable for the following:    HCT 38.3 (*)    All other components within normal limits  I-STAT TROPOININ, ED    EKG  EKG Interpretation  Date/Time:  Wednesday January 03 2016 04:00:40 EST Ventricular Rate:  115 PR Interval:    QRS Duration: 106 QT Interval:  332 QTC Calculation: 460 R Axis:   93 Text Interpretation:  Sinus tachycardia Prolonged PR interval Biatrial enlargement Borderline right axis deviation Nonspecific repol abnormality, lateral leads ST elevation AVR Confirmed by Dina Rich  MD, Royce Stegman ([id number]) on 01/03/2016 4:03:52 AM       Radiology Dg Chest 2 View  Result Date: 01/03/2016 CLINICAL DATA:  Chest pain tonight. EXAM: CHEST  2 VIEW COMPARISON:  12/22/2015 FINDINGS: Unchanged mild cardiomegaly.  Prior sternotomy and CABG. The lungs are clear. There is no  pleural effusion. The pulmonary vasculature is normal. Hilar and mediastinal contours are unremarkable and unchanged. IMPRESSION: Stable cardiomegaly.  No acute findings. Electronically Signed   By: Andreas Newport M.D.   On: 01/03/2016 02:57    Procedures Procedures (including critical care time)  CRITICAL CARE Performed by: Merryl Hacker   Total critical care time: 25 minutes  Critical care time was exclusive of separately billable procedures and treating other patients.  Critical care was necessary to treat or prevent imminent or life-threatening deterioration.  Critical care was time spent personally by me on the following activities: development of treatment plan with patient and/or surrogate as well as nursing, discussions with consultants, evaluation of patient's response to treatment, examination of patient, obtaining history from patient or surrogate, ordering and performing treatments and interventions, ordering and review of laboratory studies, ordering and review of radiographic studies, pulse oximetry and re-evaluation of patient's condition.   Medications Ordered in ED Medications  carvedilol (COREG) tablet 25 mg (not administered)  nitroGLYCERIN (NITROGLYN) 2 % ointment 1 inch (1 inch Topical Given 01/03/16 0323)  hydrALAZINE (APRESOLINE) injection 5 mg (5 mg Intravenous Given 01/03/16 0335)  amLODipine (NORVASC) tablet 10 mg (10 mg Oral Given 01/03/16 0335)  nitroGLYCERIN 50 mg in dextrose 5 % 250 mL (0.2 mg/mL) infusion (10 mcg/min Intravenous Rate/Dose Change 01/03/16 0420)     Initial Impression / Assessment and Plan / ED Course  I have reviewed the triage vital signs and the nursing notes.  Pertinent labs & imaging results that were available during my care of the patient were reviewed by me and considered in my medical decision making (see chart for details).  Clinical Course     Patient presents with chest pain. Recent in STEMI and hypertensive emergency.  Unable to afford his medications at home. Notably hypertensive upon arrival. Pain improved with nitroglycerin. He has known coronary artery disease. He is currently chest pain-free. Initial troponin negative. No ischemic changes on EKG. He was initially given hydralazine and his home medications. Patient did have recurrence of chest pain while in the ER. Repeat EKG shows mild elevation in aVR. This is isolated. Does not meet  STEMI criteria. Patient placed on a nitroglycerin drip. Discussed with cardiology. Will admit to the hospitalist for hypertensive emergency. Hold heparin at this time unless troponin rises. Patient will need case management or social work help in order to obtain medications as an outpatient.  I personally performed the services described in this documentation, which was scribed in my presence. The recorded information has been reviewed and is accurate.  Final Clinical Impressions(s) / ED Diagnoses   Final diagnoses:  Hypertensive emergency    New Prescriptions New Prescriptions   No medications on file     Merryl Hacker, MD 01/03/16 [phone]

## 2016-01-04 DIAGNOSIS — R079 Chest pain, unspecified: Secondary | ICD-10-CM | POA: Diagnosis present

## 2016-01-04 DIAGNOSIS — I2510 Atherosclerotic heart disease of native coronary artery without angina pectoris: Secondary | ICD-10-CM

## 2016-01-04 DIAGNOSIS — E119 Type 2 diabetes mellitus without complications: Secondary | ICD-10-CM | POA: Diagnosis not present

## 2016-01-04 DIAGNOSIS — I160 Hypertensive urgency: Secondary | ICD-10-CM | POA: Diagnosis not present

## 2016-01-04 DIAGNOSIS — E871 Hypo-osmolality and hyponatremia: Secondary | ICD-10-CM | POA: Diagnosis not present

## 2016-01-04 DIAGNOSIS — Z23 Encounter for immunization: Secondary | ICD-10-CM | POA: Diagnosis not present

## 2016-01-04 DIAGNOSIS — I119 Hypertensive heart disease without heart failure: Secondary | ICD-10-CM | POA: Diagnosis not present

## 2016-01-04 LAB — GLUCOSE, CAPILLARY
GLUCOSE-CAPILLARY: 166 mg/dL — AB (ref 65–99)
Glucose-Capillary: 103 mg/dL — ABNORMAL HIGH (ref 65–99)
Glucose-Capillary: 106 mg/dL — ABNORMAL HIGH (ref 65–99)
Glucose-Capillary: 82 mg/dL (ref 65–99)

## 2016-01-04 MED ORDER — AMLODIPINE BESYLATE 10 MG PO TABS: 10.0000 mg | ORAL_TABLET | Freq: Every day | ORAL | 3 refills | Status: DC

## 2016-01-04 MED ORDER — ATORVASTATIN CALCIUM 80 MG PO TABS: 80.0000 mg | ORAL_TABLET | Freq: Every day | ORAL | 3 refills | Status: DC

## 2016-01-04 MED ORDER — CARVEDILOL 25 MG PO TABS: 25.0000 mg | ORAL_TABLET | Freq: Two times a day (BID) | ORAL | 3 refills | Status: DC

## 2016-01-04 MED ORDER — ISOSORBIDE MONONITRATE ER 30 MG PO TB24: 30.0000 mg | ORAL_TABLET | Freq: Every day | ORAL | 3 refills | Status: DC

## 2016-01-04 MED ORDER — CLOPIDOGREL BISULFATE 75 MG PO TABS: 75.0000 mg | ORAL_TABLET | Freq: Every day | ORAL | 3 refills | Status: DC

## 2016-01-04 MED ORDER — LOSARTAN POTASSIUM 25 MG PO TABS: 25.0000 mg | ORAL_TABLET | Freq: Every day | ORAL | 3 refills | Status: DC

## 2016-01-04 NOTE — Care Management Note (Addendum)
Case Management Note  Patient Details  Name: Darrell [name] MRN: [id number] Date of Birth: 03-08-1955  Subjective/Objective:   Pt lives with wife and son, both he and wife are on disability income.  Has no PCP, did not get RXs filled due to cost.  Had a Medcare D plan but ceased paying because he felt premium was too high ($23/month).  Provided pt with coupons for one month supply of losartan, amlodipine, and clopidogril, and patient assistance program application through [employer] that will provide all three if he qualifies based on income.  Imdur and Coreg are available @ [facility] for $4/month.  Provided pt with list of free or [place] clinics [place].   Pt will get prescriptions [facility] and has been directed to call on Monday for a hospital follow-up appointment.                    Expected Discharge Plan:  Home/Self Care  Discharge planning Services  CM Consult, Medication Assistance, Indigent Health Clinic  Status of Service:  Completed, signed off  Darrell [name], Darrell [name], [place]RN 01/04/2016, 10:52 AM

## 2016-01-04 NOTE — Care Management Obs Status (Signed)
MEDICARE OBSERVATION STATUS NOTIFICATION   Patient Details  Name: Darrell [name] MRN: [id number] Date of Birth: Oct 06, 1955   Medicare Observation Status Notification Given:  Yes    MayoChapman Fitch, Shayona Hibbitts T, RN 01/04/2016, 10:57 AM

## 2016-01-04 NOTE — Discharge Summary (Signed)
Physician Discharge Summary  Darrell [name] MRN: [id number] DOB/AGE: July 31, 1955 60 y.o.  PCP: No PCP Per Patient   Admit date: 01/03/2016 Discharge date: 01/04/2016  Discharge Diagnoses:    Active Problems:   Type 2 diabetes mellitus without complication, without long-term current use of insulin (HCC)   Coronary atherosclerosis, s/p CABG in 2009    Hypertensive urgency   Noncompliance   Hyponatremia   Hypertensive emergency    Follow-up recommendations Follow-up with PCP in 3-5 days , including all  additional recommended appointments as below Follow-up CBC, CMP in 3-5 days       Current Discharge Medication List    CONTINUE these medications which have CHANGED   Details  amLODipine (NORVASC) 10 MG tablet Take 1 tablet (10 mg total) by mouth daily. Qty: 30 tablet, Refills: 3    atorvastatin (LIPITOR) 80 MG tablet Take 1 tablet (80 mg total) by mouth daily. Qty: 90 tablet, Refills: 3    carvedilol (COREG) 25 MG tablet Take 1 tablet (25 mg total) by mouth 2 (two) times daily with a meal. Qty: 180 tablet, Refills: 3    clopidogrel (PLAVIX) 75 MG tablet Take 1 tablet (75 mg total) by mouth daily. Qty: 90 tablet, Refills: 3    isosorbide mononitrate (IMDUR) 30 MG 24 hr tablet Take 1 tablet (30 mg total) by mouth daily. Qty: 90 tablet, Refills: 3    losartan (COZAAR) 25 MG tablet Take 1 tablet (25 mg total) by mouth daily. Qty: 90 tablet, Refills: 3      CONTINUE these medications which have NOT CHANGED   Details  aspirin EC 81 MG EC tablet Take 1 tablet (81 mg total) by mouth daily.    nitroGLYCERIN (NITROSTAT) 0.4 MG SL tablet Place 1 tablet (0.4 mg total) under the tongue every 5 (five) minutes as needed for chest pain. Qty: 25 tablet, Refills: 3    glucose monitoring kit (FREESTYLE) monitoring kit 1 each by Does not apply route once. Glucometer with test strips and lancets to check blood glucose BID ac breakfast and dinner. Qty: 1 each, Refills: 0     metFORMIN (GLUCOPHAGE) 500 MG tablet Take 1 tablet (500 mg total) by mouth 2 (two) times daily with a meal. Qty: 180 tablet, Refills: 3         Discharge Condition: *Stable   Discharge Instructions Get Medicines reviewed and adjusted: Please take all your medications with you for your next visit with your Primary MD  Please request your Primary MD to go over all hospital tests and procedure/radiological results at the follow up, please ask your Primary MD to get all Hospital records sent to his/her office.  If you experience worsening of your admission symptoms, develop shortness of breath, life threatening emergency, suicidal or homicidal thoughts you must seek medical attention immediately by calling 911 or calling your MD immediately if symptoms less severe.  You must read complete instructions/literature along with all the possible adverse reactions/side effects for all the Medicines you take and that have been prescribed to you. Take any new Medicines after you have completely understood and accpet all the possible adverse reactions/side effects.   Do not drive when taking Pain medications.   Do not take more than prescribed Pain, Sleep and Anxiety Medications  Special Instructions: If you have smoked or chewed Tobacco in the last 2 yrs please stop smoking, stop any regular Alcohol and or any Recreational drug use.  Wear Seat belts while driving.  Please note  You  were cared for by a hospitalist during your hospital stay. Once you are discharged, your primary care physician will handle any further medical issues. Please note that NO REFILLS for any discharge medications will be authorized once you are discharged, as it is imperative that you return to your primary care physician (or establish a relationship with a primary care physician if you do not have one) for your aftercare needs so that they can reassess your need for medications and monitor your lab values.     No  Known Allergies    Disposition: 01-Home or Self Care   Consults:  Cardiology    Significant Diagnostic Studies:  Dg Chest 2 View  Result Date: 01/03/2016 CLINICAL DATA:  Chest pain tonight. EXAM: CHEST  2 VIEW COMPARISON:  12/22/2015 FINDINGS: Unchanged mild cardiomegaly.  Prior sternotomy and CABG. The lungs are clear. There is no pleural effusion. The pulmonary vasculature is normal. Hilar and mediastinal contours are unremarkable and unchanged. IMPRESSION: Stable cardiomegaly.  No acute findings. Electronically Signed   By: Andreas Newport M.D.   On: 01/03/2016 02:57   Dg Chest 2 View  Result Date: 12/22/2015 CLINICAL DATA:  Midsternal chest pain. EXAM: CHEST  2 VIEW COMPARISON:  04/16/2015 FINDINGS: Postsurgical changes from CABG is stable. Cardiomediastinal silhouette is mildly enlarged. Mediastinal contours appear intact. There is no evidence of focal airspace consolidation, pleural effusion or pneumothorax. Osseous structures are without acute abnormality. Soft tissues are grossly normal. IMPRESSION: Mildly enlarged cardiac silhouette, otherwise no active cardiopulmonary disease. Electronically Signed   By: Fidela Salisbury M.D.   On: 12/22/2015 14:04       Filed Weights   01/03/16 0155 01/03/16 0616 01/04/16 0400  Weight: 96.2 kg (212 lb) 91.8 kg (202 lb 6.1 oz) 93.1 kg (205 lb 4 oz)     Microbiology: Recent Results (from the past 240 hour(s))  MRSA PCR Screening     Status: None   Collection Time: 01/03/16  6:06 AM  Result Value Ref Range Status   MRSA by PCR NEGATIVE NEGATIVE Final    Comment:        The GeneXpert MRSA Assay (FDA approved for NASAL specimens only), is one component of a comprehensive MRSA colonization surveillance program. It is not intended to diagnose MRSA infection nor to guide or monitor treatment for MRSA infections.        Blood Culture    Component Value Date/Time   SDES URINE, RANDOM 10/25/2012 0240   SPECREQUEST NONE  10/25/2012 0240   CULT  10/25/2012 0240    Multiple bacterial morphotypes present, none predominant. Suggest appropriate recollection if clinically indicated. Performed at [facility] 10/26/2012 FINAL 10/25/2012 0240      Labs: Results for orders placed or performed during the hospital encounter of 01/03/16 (from the past 48 hour(s))  Basic metabolic panel     Status: Abnormal   Collection Time: 01/03/16  1:57 AM  Result Value Ref Range   Sodium 134 (L) 135 - 145 mmol/L   Potassium 4.8 3.5 - 5.1 mmol/L   Chloride 100 (L) 101 - 111 mmol/L   CO2 27 22 - 32 mmol/L   Glucose, Bld 192 (H) 65 - 99 mg/dL   BUN 17 6 - 20 mg/dL   Creatinine, Ser 1.18 0.61 - 1.24 mg/dL   Calcium 8.6 (L) 8.9 - 10.3 mg/dL   GFR calc non Af Amer >60 >60 mL/min   GFR calc Af Amer >60 >60 mL/min  Comment: (NOTE) The eGFR has been calculated using the CKD EPI equation. This calculation has not been validated in all clinical situations. eGFR's persistently <60 mL/min signify possible Chronic Kidney Disease.    Anion gap 7 5 - 15  CBC     Status: Abnormal   Collection Time: 01/03/16  1:57 AM  Result Value Ref Range   WBC 6.0 4.0 - 10.5 K/uL   RBC 4.57 4.22 - 5.81 MIL/uL   Hemoglobin 13.0 13.0 - 17.0 g/dL   HCT 38.3 (L) 39.0 - 52.0 %   MCV 83.8 78.0 - 100.0 fL   MCH 28.4 26.0 - 34.0 pg   MCHC 33.9 30.0 - 36.0 g/dL   RDW 14.1 11.5 - 15.5 %   Platelets 206 150 - 400 K/uL  I-stat troponin, ED     Status: None   Collection Time: 01/03/16  3:03 AM  Result Value Ref Range   Troponin i, poc 0.01 0.00 - 0.08 ng/mL   Comment 3            Comment: Due to the release kinetics of cTnI, a negative result within the first hours of the onset of symptoms does not rule out myocardial infarction with certainty. If myocardial infarction is still suspected, repeat the test at appropriate intervals.   MRSA PCR Screening     Status: None   Collection Time: 01/03/16  6:06 AM  Result Value Ref  Range   MRSA by PCR NEGATIVE NEGATIVE    Comment:        The GeneXpert MRSA Assay (FDA approved for NASAL specimens only), is one component of a comprehensive MRSA colonization surveillance program. It is not intended to diagnose MRSA infection nor to guide or monitor treatment for MRSA infections.   Troponin I-serum (0, 3, 6 hours)     Status: None   Collection Time: 01/03/16  7:32 AM  Result Value Ref Range   Troponin I <0.03 <0.03 ng/mL  Glucose, capillary     Status: Abnormal   Collection Time: 01/03/16  8:29 AM  Result Value Ref Range   Glucose-Capillary 128 (H) 65 - 99 mg/dL  Troponin I-serum (0, 3, 6 hours)     Status: Abnormal   Collection Time: 01/03/16  9:22 AM  Result Value Ref Range   Troponin I 0.03 (HH) <0.03 ng/mL    Comment: CRITICAL RESULT CALLED TO, READ BACK BY AND VERIFIED WITH: HOOD,T RN @ 1039 01/03/16 LEONARD,A   Troponin I-serum (0, 3, 6 hours)     Status: Abnormal   Collection Time: 01/03/16 12:09 PM  Result Value Ref Range   Troponin I 0.04 (HH) <0.03 ng/mL    Comment: CRITICAL VALUE NOTED.  VALUE IS CONSISTENT WITH PREVIOUSLY REPORTED AND CALLED VALUE.  Glucose, capillary     Status: Abnormal   Collection Time: 01/03/16 12:52 PM  Result Value Ref Range   Glucose-Capillary 129 (H) 65 - 99 mg/dL  Glucose, capillary     Status: Abnormal   Collection Time: 01/03/16  5:10 PM  Result Value Ref Range   Glucose-Capillary 155 (H) 65 - 99 mg/dL  Glucose, capillary     Status: Abnormal   Collection Time: 01/03/16  8:43 PM  Result Value Ref Range   Glucose-Capillary 138 (H) 65 - 99 mg/dL  Glucose, capillary     Status: Abnormal   Collection Time: 01/04/16 12:02 AM  Result Value Ref Range   Glucose-Capillary 106 (H) 65 - 99 mg/dL  Glucose, capillary  Status: None   Collection Time: 01/04/16  3:57 AM  Result Value Ref Range   Glucose-Capillary 82 65 - 99 mg/dL   Comment 1 Notify RN   Glucose, capillary     Status: Abnormal   Collection Time:  01/04/16  7:56 AM  Result Value Ref Range   Glucose-Capillary 103 (H) 65 - 99 mg/dL     Lipid Panel     Component Value Date/Time   CHOL 154 04/05/2015 0010   TRIG 79 04/05/2015 0010   HDL 32 (L) 04/05/2015 0010   CHOLHDL 4.8 04/05/2015 0010   VLDL 16 04/05/2015 0010   LDLCALC 106 (H) 04/05/2015 0010     Lab Results  Component Value Date   HGBA1C 5.6 04/04/2015   HGBA1C 6.2 (H) 03/26/2014   HGBA1C 7.0 (H) 10/24/2012        HPI :  Darrell Stief McIntyreis a 60 y.o.malewith past medical history of CAD (s/p CABG in 11/2007 w/ LIMA-LAD, SVG-D1, LRA-OM1, SVG-PDA, known occlusion of SVG-D1 and occlusion of SVG-PDA-PLA, and recent DES to mid-LCx in 03/2015), HTN, HLD, Type 2 DM and non compliance who presented to [facility] on 01/03/16 for evaluation of chest pain in the setting of hypertensive urgency.His last cardiac catheterization was in 03/2015 in the setting of an NSTEMI. Troponin values peaked at 9.51. Cath at that time showed known 3-vessel CAD .He presented to the ED on 01/03/16 with chest pain and was hypertensive after being non compliant with his meds. Chest pain was severe sharp pain, he took a 18m ASA and pain did not resolve, so he called EMS. He was given additional ASA and a SL Nitro which helped his pain. His BP was 195/116 when he arrived. EKG showed NSR with T wave inversion in his inferolateral leads. He had a vagal episode 12/6. Nursing reports that he was briefly unarousable but never lost a pulse   HOSPITAL COURSE:   1. Chest pain in the setting of hypertensive urgency Initially placed on nitroglycerin drip  which was titrated titrated off -Continue amlodipine Continue carvedilol, Imdur, losartan -Serial troponins within normal limits Cardiology was consulted  no longer has chest discomfort, episode of bradycardia/junctional escape rhythm clearly consistent with a vasovagal event.Very low suspicion for a new acute coronary event Case management consulted-patient  appears to have some cognitive delay,  for medication assistance,   2. Coronary disease: -Restarted  Plavix -Continue statin, aspirin Most important agents are aspirin, clopidogrel, carvedilol, amlodipine and statin   3. NIDDM:  Resume medications  Discharge Exam:   Blood pressure (!) 143/89, pulse 72, temperature 97.4 F (36.3 C), temperature source Oral, resp. rate 10, height 6' 1" (1.854 m), weight 93.1 kg (205 lb 4 oz), SpO2 98 %. General: Flat affect, obese male  Psych: Flat affect  Neuro: Alert and oriented X 3. Moves all extremities spontaneously. HEENT: Normal           Neck: Supple without bruits or JVD. Lungs:  Resp regular and unlabored, CTA. Heart: RRR no s3, s4, or murmurs. Abdomen: Soft, non-tender, non-distended, BS + x 4.        Signed:Reyne Dumas12/07/2015, 9:17 AM        Time spent >45 mins

## 2016-01-10 ENCOUNTER — Inpatient Hospital Stay: Payer: Medicare Other

## 2016-01-11 ENCOUNTER — Encounter: Payer: Self-pay | Admitting: Physician Assistant

## 2016-03-02 ENCOUNTER — Encounter (HOSPITAL_COMMUNITY): Payer: Self-pay

## 2016-03-02 ENCOUNTER — Inpatient Hospital Stay (HOSPITAL_COMMUNITY)
Admission: EM | Admit: 2016-03-02 | Discharge: 2016-03-08 | DRG: 872 | Disposition: A | Discharge status: Home / Self Care | Payer: Medicare Other | Attending: Internal Medicine | Admitting: Internal Medicine

## 2016-03-02 ENCOUNTER — Emergency Department (HOSPITAL_COMMUNITY): Payer: Medicare Other

## 2016-03-02 DIAGNOSIS — A419 Sepsis, unspecified organism: Secondary | ICD-10-CM | POA: Diagnosis not present

## 2016-03-02 DIAGNOSIS — R748 Abnormal levels of other serum enzymes: Secondary | ICD-10-CM | POA: Diagnosis not present

## 2016-03-02 DIAGNOSIS — I160 Hypertensive urgency: Secondary | ICD-10-CM | POA: Diagnosis not present

## 2016-03-02 DIAGNOSIS — Z955 Presence of coronary angioplasty implant and graft: Secondary | ICD-10-CM

## 2016-03-02 DIAGNOSIS — I10 Essential (primary) hypertension: Secondary | ICD-10-CM | POA: Diagnosis not present

## 2016-03-02 DIAGNOSIS — Z9119 Patient's noncompliance with other medical treatment and regimen: Secondary | ICD-10-CM | POA: Diagnosis not present

## 2016-03-02 DIAGNOSIS — I252 Old myocardial infarction: Secondary | ICD-10-CM

## 2016-03-02 DIAGNOSIS — Z7982 Long term (current) use of aspirin: Secondary | ICD-10-CM | POA: Diagnosis not present

## 2016-03-02 DIAGNOSIS — I25110 Atherosclerotic heart disease of native coronary artery with unstable angina pectoris: Secondary | ICD-10-CM | POA: Diagnosis present

## 2016-03-02 DIAGNOSIS — R05 Cough: Secondary | ICD-10-CM | POA: Diagnosis not present

## 2016-03-02 DIAGNOSIS — N182 Chronic kidney disease, stage 2 (mild): Secondary | ICD-10-CM | POA: Diagnosis present

## 2016-03-02 DIAGNOSIS — E785 Hyperlipidemia, unspecified: Secondary | ICD-10-CM | POA: Diagnosis present

## 2016-03-02 DIAGNOSIS — I25118 Atherosclerotic heart disease of native coronary artery with other forms of angina pectoris: Secondary | ICD-10-CM

## 2016-03-02 DIAGNOSIS — Z79899 Other long term (current) drug therapy: Secondary | ICD-10-CM

## 2016-03-02 DIAGNOSIS — N390 Urinary tract infection, site not specified: Secondary | ICD-10-CM | POA: Diagnosis present

## 2016-03-02 DIAGNOSIS — E1151 Type 2 diabetes mellitus with diabetic peripheral angiopathy without gangrene: Secondary | ICD-10-CM | POA: Diagnosis present

## 2016-03-02 DIAGNOSIS — Z87891 Personal history of nicotine dependence: Secondary | ICD-10-CM | POA: Diagnosis not present

## 2016-03-02 DIAGNOSIS — A4151 Sepsis due to Escherichia coli [E. coli]: Principal | ICD-10-CM | POA: Diagnosis present

## 2016-03-02 DIAGNOSIS — R079 Chest pain, unspecified: Secondary | ICD-10-CM

## 2016-03-02 DIAGNOSIS — Z951 Presence of aortocoronary bypass graft: Secondary | ICD-10-CM

## 2016-03-02 DIAGNOSIS — I214 Non-ST elevation (NSTEMI) myocardial infarction: Secondary | ICD-10-CM | POA: Insufficient documentation

## 2016-03-02 DIAGNOSIS — R0789 Other chest pain: Secondary | ICD-10-CM | POA: Diagnosis not present

## 2016-03-02 DIAGNOSIS — N179 Acute kidney failure, unspecified: Secondary | ICD-10-CM | POA: Diagnosis present

## 2016-03-02 DIAGNOSIS — I2510 Atherosclerotic heart disease of native coronary artery without angina pectoris: Secondary | ICD-10-CM | POA: Diagnosis present

## 2016-03-02 DIAGNOSIS — D6959 Other secondary thrombocytopenia: Secondary | ICD-10-CM | POA: Diagnosis not present

## 2016-03-02 DIAGNOSIS — Z7902 Long term (current) use of antithrombotics/antiplatelets: Secondary | ICD-10-CM

## 2016-03-02 DIAGNOSIS — R319 Hematuria, unspecified: Secondary | ICD-10-CM | POA: Diagnosis present

## 2016-03-02 DIAGNOSIS — Z7984 Long term (current) use of oral hypoglycemic drugs: Secondary | ICD-10-CM

## 2016-03-02 DIAGNOSIS — E1122 Type 2 diabetes mellitus with diabetic chronic kidney disease: Secondary | ICD-10-CM | POA: Diagnosis present

## 2016-03-02 DIAGNOSIS — E119 Type 2 diabetes mellitus without complications: Secondary | ICD-10-CM

## 2016-03-02 DIAGNOSIS — E872 Acidosis: Secondary | ICD-10-CM | POA: Diagnosis present

## 2016-03-02 DIAGNOSIS — R7881 Bacteremia: Secondary | ICD-10-CM | POA: Diagnosis not present

## 2016-03-02 DIAGNOSIS — I129 Hypertensive chronic kidney disease with stage 1 through stage 4 chronic kidney disease, or unspecified chronic kidney disease: Secondary | ICD-10-CM | POA: Diagnosis present

## 2016-03-02 HISTORY — DX: Other ill-defined heart diseases: I51.89

## 2016-03-02 LAB — URINALYSIS, ROUTINE W REFLEX MICROSCOPIC
BILIRUBIN URINE: NEGATIVE
Glucose, UA: NEGATIVE mg/dL
Ketones, ur: NEGATIVE mg/dL
Nitrite: NEGATIVE
Protein, ur: 30 mg/dL — AB
SPECIFIC GRAVITY, URINE: 1.011 (ref 1.005–1.030)
Squamous Epithelial / LPF: NONE SEEN
pH: 5 (ref 5.0–8.0)

## 2016-03-02 LAB — CBC WITH DIFFERENTIAL/PLATELET
BASOS ABS: 0 10*3/uL (ref 0.0–0.1)
Basophils Relative: 0 %
EOS PCT: 1 %
Eosinophils Absolute: 0 10*3/uL (ref 0.0–0.7)
HCT: 42.4 % (ref 39.0–52.0)
Hemoglobin: 14.1 g/dL (ref 13.0–17.0)
LYMPHS PCT: 12 %
Lymphs Abs: 0.7 10*3/uL (ref 0.7–4.0)
MCH: 28.3 pg (ref 26.0–34.0)
MCHC: 33.3 g/dL (ref 30.0–36.0)
MCV: 85 fL (ref 78.0–100.0)
Monocytes Absolute: 0 10*3/uL — ABNORMAL LOW (ref 0.1–1.0)
Monocytes Relative: 0 %
Neutro Abs: 4.7 10*3/uL (ref 1.7–7.7)
Neutrophils Relative %: 87 %
PLATELETS: 157 10*3/uL (ref 150–400)
RBC: 4.99 MIL/uL (ref 4.22–5.81)
RDW: 14.4 % (ref 11.5–15.5)
WBC: 5.5 10*3/uL (ref 4.0–10.5)

## 2016-03-02 LAB — PROTIME-INR
INR: 1.28
Prothrombin Time: 16 s — ABNORMAL HIGH (ref 11.4–15.2)

## 2016-03-02 LAB — INFLUENZA PANEL BY PCR (TYPE A & B)
Influenza A By PCR: NEGATIVE
Influenza B By PCR: NEGATIVE

## 2016-03-02 LAB — CBC
HCT: 36.3 % — ABNORMAL LOW (ref 39.0–52.0)
Hemoglobin: 12.2 g/dL — ABNORMAL LOW (ref 13.0–17.0)
MCH: 28.2 pg (ref 26.0–34.0)
MCHC: 33.6 g/dL (ref 30.0–36.0)
MCV: 83.8 fL (ref 78.0–100.0)
Platelets: 128 10*3/uL — ABNORMAL LOW (ref 150–400)
RBC: 4.33 MIL/uL (ref 4.22–5.81)
RDW: 14.6 % (ref 11.5–15.5)
WBC: 14.7 10*3/uL — ABNORMAL HIGH (ref 4.0–10.5)

## 2016-03-02 LAB — I-STAT CG4 LACTIC ACID, ED
LACTIC ACID, VENOUS: 3.23 mmol/L — AB (ref 0.5–1.9)
Lactic Acid, Venous: 2.33 mmol/L (ref 0.5–1.9)

## 2016-03-02 LAB — BASIC METABOLIC PANEL
ANION GAP: 10 (ref 5–15)
BUN: 24 mg/dL — AB (ref 6–20)
CALCIUM: 8.8 mg/dL — AB (ref 8.9–10.3)
CO2: 23 mmol/L (ref 22–32)
Chloride: 104 mmol/L (ref 101–111)
Creatinine, Ser: 1.44 mg/dL — ABNORMAL HIGH (ref 0.61–1.24)
GFR calc Af Amer: 60 mL/min — ABNORMAL LOW (ref >60)
GFR, EST NON AFRICAN AMERICAN: 52 mL/min — AB (ref >60)
GLUCOSE: 126 mg/dL — AB (ref 65–99)
Potassium: 4.9 mmol/L (ref 3.5–5.1)
SODIUM: 137 mmol/L (ref 135–145)

## 2016-03-02 LAB — HEPATIC FUNCTION PANEL
ALT: 13 U/L — ABNORMAL LOW (ref 17–63)
AST: 26 U/L (ref 15–41)
Albumin: 2.6 g/dL — ABNORMAL LOW (ref 3.5–5.0)
Alkaline Phosphatase: 60 U/L (ref 38–126)
Bilirubin, Direct: 0.3 mg/dL (ref 0.1–0.5)
Indirect Bilirubin: 0.9 mg/dL (ref 0.3–0.9)
Total Bilirubin: 1.2 mg/dL (ref 0.3–1.2)
Total Protein: 5.9 g/dL — ABNORMAL LOW (ref 6.5–8.1)

## 2016-03-02 LAB — CREATININE, SERUM
Creatinine, Ser: 1.9 mg/dL — ABNORMAL HIGH (ref 0.61–1.24)
GFR calc Af Amer: 43 mL/min — ABNORMAL LOW (ref >60)
GFR calc non Af Amer: 37 mL/min — ABNORMAL LOW (ref >60)

## 2016-03-02 LAB — GLUCOSE, CAPILLARY
GLUCOSE-CAPILLARY: 151 mg/dL — AB (ref 65–99)
Glucose-Capillary: 125 mg/dL — ABNORMAL HIGH (ref 65–99)

## 2016-03-02 LAB — LACTIC ACID, PLASMA: Lactic Acid, Venous: 2.7 mmol/L (ref 0.5–1.9)

## 2016-03-02 LAB — APTT: aPTT: 40 s — ABNORMAL HIGH (ref 24–36)

## 2016-03-02 LAB — TROPONIN I: Troponin I: 0.46 ng/mL (ref <0.03)

## 2016-03-02 LAB — I-STAT TROPONIN, ED: Troponin i, poc: 0.01 ng/mL (ref 0.00–0.08)

## 2016-03-02 LAB — CBG MONITORING, ED: Glucose-Capillary: 114 mg/dL — ABNORMAL HIGH (ref 65–99)

## 2016-03-02 MED ORDER — INSULIN ASPART 100 UNIT/ML ~~LOC~~ SOLN
0.0000 [IU] | SUBCUTANEOUS | Status: DC
  Administered 2016-03-02: 2 [IU] via SUBCUTANEOUS
  Administered 2016-03-03: 1 [IU] via SUBCUTANEOUS
  Administered 2016-03-04: 2 [IU] via SUBCUTANEOUS
  Administered 2016-03-04: 1 [IU] via SUBCUTANEOUS
  Administered 2016-03-05: 2 [IU] via SUBCUTANEOUS
  Administered 2016-03-05 – 2016-03-06 (×2): 1 [IU] via SUBCUTANEOUS
  Administered 2016-03-06: 2 [IU] via SUBCUTANEOUS
  Administered 2016-03-07: 1 [IU] via SUBCUTANEOUS

## 2016-03-02 MED ORDER — VANCOMYCIN HCL 10 G IV SOLR
1500.0000 mg | INTRAVENOUS | Status: AC
  Administered 2016-03-02: 1500 mg via INTRAVENOUS
  Filled 2016-03-02: qty 1500

## 2016-03-02 MED ORDER — SODIUM CHLORIDE 0.9 % IV SOLN
1000.0000 mL | INTRAVENOUS | Status: DC
  Administered 2016-03-02 – 2016-03-04 (×5): 1000 mL via INTRAVENOUS

## 2016-03-02 MED ORDER — ASPIRIN EC 81 MG PO TBEC
81.0000 mg | DELAYED_RELEASE_TABLET | Freq: Every day | ORAL | Status: DC
  Administered 2016-03-03 – 2016-03-08 (×6): 81 mg via ORAL
  Filled 2016-03-02 (×6): qty 1

## 2016-03-02 MED ORDER — ATORVASTATIN CALCIUM 80 MG PO TABS
80.0000 mg | ORAL_TABLET | Freq: Every day | ORAL | Status: DC
  Administered 2016-03-03 – 2016-03-08 (×6): 80 mg via ORAL
  Filled 2016-03-02 (×6): qty 1

## 2016-03-02 MED ORDER — ENOXAPARIN SODIUM 40 MG/0.4ML ~~LOC~~ SOLN
40.0000 mg | SUBCUTANEOUS | Status: DC
  Administered 2016-03-02: 40 mg via SUBCUTANEOUS
  Filled 2016-03-02: qty 0.4

## 2016-03-02 MED ORDER — CLOPIDOGREL BISULFATE 75 MG PO TABS
75.0000 mg | ORAL_TABLET | Freq: Every day | ORAL | Status: DC
  Administered 2016-03-03 – 2016-03-08 (×6): 75 mg via ORAL
  Filled 2016-03-02 (×6): qty 1

## 2016-03-02 MED ORDER — IBUPROFEN 400 MG PO TABS
600.0000 mg | ORAL_TABLET | Freq: Once | ORAL | Status: AC
  Administered 2016-03-02: 20:00:00 600 mg via ORAL
  Filled 2016-03-02: qty 1

## 2016-03-02 MED ORDER — SODIUM CHLORIDE 0.9 % IV BOLUS (SEPSIS)
500.0000 mL | Freq: Once | INTRAVENOUS | Status: AC
  Administered 2016-03-02: 500 mL via INTRAVENOUS

## 2016-03-02 MED ORDER — LOSARTAN POTASSIUM 25 MG PO TABS: 25.0000 mg | ORAL_TABLET | Freq: Once | ORAL | Status: DC

## 2016-03-02 MED ORDER — PIPERACILLIN-TAZOBACTAM 3.375 G IVPB 30 MIN
3.3750 g | INTRAVENOUS | Status: AC
  Administered 2016-03-02: 3.375 g via INTRAVENOUS
  Filled 2016-03-02: qty 50

## 2016-03-02 MED ORDER — ACETAMINOPHEN 650 MG RE SUPP: 650.0000 mg | Freq: Four times a day (QID) | RECTAL | Status: DC | PRN

## 2016-03-02 MED ORDER — ACETAMINOPHEN 500 MG PO TABS
1000.0000 mg | ORAL_TABLET | Freq: Once | ORAL | Status: AC
  Administered 2016-03-02: 1000 mg via ORAL
  Filled 2016-03-02: qty 2

## 2016-03-02 MED ORDER — CARVEDILOL 12.5 MG PO TABS: 25.0000 mg | ORAL_TABLET | Freq: Two times a day (BID) | ORAL | Status: DC

## 2016-03-02 MED ORDER — NITROGLYCERIN 0.4 MG SL SUBL: 0.4000 mg | SUBLINGUAL_TABLET | SUBLINGUAL | Status: DC | PRN

## 2016-03-02 MED ORDER — LABETALOL HCL 5 MG/ML IV SOLN
20.0000 mg | Freq: Once | INTRAVENOUS | Status: DC
  Filled 2016-03-02: qty 4

## 2016-03-02 MED ORDER — ACETAMINOPHEN 325 MG PO TABS: 650.0000 mg | ORAL_TABLET | Freq: Four times a day (QID) | ORAL | Status: DC | PRN

## 2016-03-02 MED ORDER — ISOSORBIDE MONONITRATE ER 30 MG PO TB24
30.0000 mg | ORAL_TABLET | Freq: Every day | ORAL | Status: DC
  Administered 2016-03-03 – 2016-03-06 (×4): 30 mg via ORAL
  Filled 2016-03-02 (×4): qty 1

## 2016-03-02 MED ORDER — HYDROCODONE-ACETAMINOPHEN 5-325 MG PO TABS
1.0000 | ORAL_TABLET | ORAL | Status: DC | PRN
  Administered 2016-03-05: 2 via ORAL
  Filled 2016-03-02 (×2): qty 2

## 2016-03-02 MED ORDER — SODIUM CHLORIDE 0.9% FLUSH
3.0000 mL | Freq: Two times a day (BID) | INTRAVENOUS | Status: DC
  Administered 2016-03-02 – 2016-03-07 (×5): 3 mL via INTRAVENOUS

## 2016-03-02 MED ORDER — NITROGLYCERIN 0.4 MG SL SUBL
0.4000 mg | SUBLINGUAL_TABLET | SUBLINGUAL | Status: AC | PRN
Start: 2016-03-02 — End: 2016-03-04
  Administered 2016-03-02 – 2016-03-04 (×3): 0.4 mg via SUBLINGUAL
  Filled 2016-03-02 (×3): qty 1

## 2016-03-02 MED ORDER — AMLODIPINE BESYLATE 5 MG PO TABS
10.0000 mg | ORAL_TABLET | Freq: Once | ORAL | Status: AC
  Administered 2016-03-02: 10 mg via ORAL
  Filled 2016-03-02: qty 2

## 2016-03-02 MED ORDER — SODIUM CHLORIDE 0.9 % IV BOLUS (SEPSIS): 1000.0000 mL | Freq: Once | INTRAVENOUS | Status: DC

## 2016-03-02 MED ORDER — CARVEDILOL 6.25 MG PO TABS
6.2500 mg | ORAL_TABLET | Freq: Once | ORAL | Status: AC
  Administered 2016-03-02: 6.25 mg via ORAL
  Filled 2016-03-02: qty 1

## 2016-03-02 MED ORDER — AMLODIPINE BESYLATE 5 MG PO TABS: 10.0000 mg | ORAL_TABLET | Freq: Once | ORAL | Status: DC

## 2016-03-02 MED ORDER — LOSARTAN POTASSIUM 25 MG PO TABS
25.0000 mg | ORAL_TABLET | Freq: Once | ORAL | Status: AC
  Administered 2016-03-02: 25 mg via ORAL
  Filled 2016-03-02 (×2): qty 1

## 2016-03-02 NOTE — ED Notes (Signed)
Attempted to call report. Charge RN on floor requested repeat lactic acid and Charge RN concerned about pt heart rate. This RN told Charge Rn on floor that we will repeat lactic acid and give them a call on results

## 2016-03-02 NOTE — ED Notes (Signed)
Pt provided with turkey sandwich.

## 2016-03-02 NOTE — Progress Notes (Signed)
CRITICAL VALUE ALERT  Critical value received:  Lactic Acid 2.7  Date of notification: 03/02/2016  Time of notification:  2310  Critical value read back:Yes.    Nurse who received alert:  Lurlean NannyQuinetta Estil Vallee  MD notified (1st page):  Pixie CasinoJ. Kim  Time of first page:  2313  MD notified (2nd page):  Time of second page:  Responding MD:  Pixie CasinoJ. Kim  Time MD responded:  [phone]

## 2016-03-02 NOTE — Progress Notes (Signed)
CRITICAL VALUE ALERT  Critical value received:  Troponin 0.46   Date of notification:  03/02/2016  Time of notification:  2317  Critical value read back:Yes.    Nurse who received alert:  Lurlean NannyQuinetta Christee Mervine  MD notified (1st page): Pixie CasinoJ. Kim   Time of first page:  2320  MD notified (2nd page):  Time of second page:  Responding MD:  Pixie CasinoJ. Kim  Time MD responded:  [phone]

## 2016-03-02 NOTE — ED Triage Notes (Signed)
Pt arrives via [facility] from home c/o substernal CP around 2pm today. Pt took 324 ASA prior to EMS arrival and 1 Nitro with EMS. Pt hypertensive upon arrival 212/124 with N/V. Sinus tach on the monitor. CP now 0/10.

## 2016-03-02 NOTE — ED Notes (Signed)
Pt returned from xray and connected to the monitor 

## 2016-03-02 NOTE — ED Notes (Signed)
[facility] contacted to activate Code Sepsis 

## 2016-03-02 NOTE — Progress Notes (Signed)
Pharmacy Antibiotic Note  Manus GunningSteven S Shake is a 61 y.o. male admitted on 03/02/2016 with chest pain and sepsis. He received IV vancomycin and zosyn in ED tonight, now changed to rocephin for UTI treatment   Pharmacy has been consulted for Ceftriaxone dosing for UTI.  Plan: Ceftriaxone 1g IV q24h  Pharmacy will sign off - no dose adjustments needed if renal function changes.   Height: 6\' 1"  (185.4 cm) Weight: 212 lb (96.2 kg) IBW/kg (Calculated) : 79.9  Temp (24hrs), Avg:101.2 F (38.4 C), Min:98.4 F (36.9 C), Max:104.1 F (40.1 C)   Recent Labs Lab 03/02/16 1531 03/02/16 1738 03/02/16 2020  WBC 5.5  --   --   CREATININE 1.44*  --   --   LATICACIDVEN  --  3.23* 2.33*    Estimated Creatinine Clearance: 67.5 mL/min (by C-G formula based on SCr of 1.44 mg/dL (H)).    No Known Allergies  Antimicrobials this admission: 2/3 Vanc x1 2/3 zosyn x1 2/3 Ceftriaxone >>     Thank you for allowing pharmacy to be a part of this patient's care.  Noah Delaineuth Kaliana Albino, RPh Clinical Pharmacist Pager: [phone] 03/02/2016 9:42 PM

## 2016-03-02 NOTE — ED Notes (Signed)
Patient transported to X-ray 

## 2016-03-02 NOTE — H&P (Signed)
History and Physical    Darrell [name] KVQ:[id number] DOB: 15-Mar-1955 DOA: 03/02/2016  PCP: No PCP Per Patient  Patient coming from: Home  Chief Complaint: Chest pain  HPI: Darrell [name] is a 61 y.o. male with medical history significant of HTN, HLD, DM2, CAD, claudication who presents for chest pain.  Darrell [name] was admitted in November of 2017 for an NSTEMI and was treated with medical management.  He reports that the chest pain is similar to that time.  He states the pain is in his central chest, "sharp like a knife" and associated with nausea.  It does not radiate, not associated with diaphoresis or SOB.  He has been out of his medications for a month including IMDUR and plavix.  His last stent was placed in March of 2017 per chart review.  The pain started while he was lying in bed.  It sometimes gets worse with movement, is not reproducible.  He is a former smoker.  Initial troponin in the ED 0.01.  Heart score 4 points (moderate risk).    While in the ED, the patient was found to be tachycardic and febrile to 104.  He had a normal CXR, US showed many bacteria  And leukocytes.  His only other symptoms are occasional chills for 1 day and some back pain which came on this morning.  He attributes this to kidney pain.  He has no cough, SOB, documented fever, diarrhea, constipation.  He has no new sexual partners.  He denies burning pain when he urinates, abdominal pain.  He had one episode of emesis in the ED, is asking to eat now.  Wife was recently sick with a viral illness.  ED Course: In the ED, he was initially evaluated for chest pain, found to be febrile with tachycardia, SIRS criteria with likely source the urine.  He was initiated on vancomycin and zosyn to start broad coverage.    Review of Systems: As per HPI otherwise 10 point review of systems negative.   Past Medical History:  Diagnosis Date  . Claudication ([place])   . Coronary artery disease    a. 08/2007 s/p DES to RCA/RI;   b. 07/2006 s/p DES to p/d LCX;  c. 11/2007 CABGx5 (LIMA->LAD, VG->D1, LRA->OM1, VG->PDA->LPL;  d. 09/2012 STEMI/Cath: LM nl, LAD 100p, LCX 70p/90d, OM1 95, LPL 90d, RCA 100, LIMA->LAD nl (LAD 95d), RA->OM nl, VG->PDA->LPL 100, unable to locate VG->Diag, EF 60-65%->Med Rx. e. 03/2015: DES to mid-Cx in the setting of NSTEMI  . Diabetes mellitus without complication ([place])    a. A1c 7.0 09/2012.  Marland Kitchen Hyperlipidemia   . Hypertension   . Noncompliance     Past Surgical History:  Procedure Laterality Date  . CARDIAC CATHETERIZATION N/A 04/06/2015   Procedure: Left Heart Cath and Cors/Grafts Angiography;  Surgeon: Lorretta Harp, MD;  Location: [place] [facility];  Service: Cardiovascular;  Laterality: N/A;  . CARDIAC CATHETERIZATION N/A 04/06/2015   Procedure: Coronary Stent Intervention;  Surgeon: Lorretta Harp, MD;  2.5 mm x 12 mm long Synergy DES followed by  2.5 mm x 16 mm long Synergy DES    . CORONARY ARTERY BYPASS GRAFT     2009 LIMA to LAD, SVG to Diag, SVG to PDA and PL, left radial to OM  . LEFT HEART CATH N/A 10/24/2012   Procedure: LEFT HEART CATH;  Surgeon: Troy Sine, MD;  Location[facility] CATH LAB;  Service: Cardiovascular;  Laterality: N/A;   Confirmed today with  patient  reports that he quit smoking about 5 years ago. He has never used smokeless tobacco. He reports that he does not drink alcohol or use drugs.  No Known Allergies  Confirmed with patient.   Family History  Problem Relation Age of Onset  . Heart failure Mother   . Cancer Mother   . CAD Mother 49  . Heart failure Father   . Kidney failure Brother    Out of these medications X 1 month.  Prior to Admission medications   Medication Sig Start Date End Date Taking? Authorizing Provider  amLODipine (NORVASC) 10 MG tablet Take 1 tablet (10 mg total) by mouth daily. 01/04/16  Yes Reyne Dumas, MD  aspirin EC 81 MG EC tablet Take 1 tablet (81 mg total) by mouth daily. 10/28/12  Yes Rogelia Mire, NP  atorvastatin  (LIPITOR) 80 MG tablet Take 1 tablet (80 mg total) by mouth daily. 01/04/16  Yes Reyne Dumas, MD  carvedilol (COREG) 25 MG tablet Take 1 tablet (25 mg total) by mouth 2 (two) times daily with a meal. 01/04/16  Yes Reyne Dumas, MD  clopidogrel (PLAVIX) 75 MG tablet Take 1 tablet (75 mg total) by mouth daily. 01/04/16  Yes Reyne Dumas, MD  isosorbide mononitrate (IMDUR) 30 MG 24 hr tablet Take 1 tablet (30 mg total) by mouth daily. 01/04/16  Yes Reyne Dumas, MD  losartan (COZAAR) 25 MG tablet Take 1 tablet (25 mg total) by mouth daily. 01/04/16  Yes Reyne Dumas, MD  metFORMIN (GLUCOPHAGE) 500 MG tablet Take 1 tablet (500 mg total) by mouth 2 (two) times daily with a meal. 12/24/15  Yes Eileen Stanford, PA-C  nitroGLYCERIN (NITROSTAT) 0.4 MG SL tablet Place 1 tablet (0.4 mg total) under the tongue every 5 (five) minutes as needed for chest pain. 04/17/15  Yes Rhonda G Barrett, PA-C  glucose monitoring kit (FREESTYLE) monitoring kit 1 each by Does not apply route once. Glucometer with test strips and lancets to check blood glucose BID ac breakfast and dinner. 10/28/12   Rogelia Mire, NP    Physical Exam: Vitals:   03/02/16 1815 03/02/16 1830 03/02/16 1900 03/02/16 1922  BP: 155/92 153/90 145/79   Pulse: 118 (!) 123 (!) 121   Resp: '24 19 22   '$ Temp:    100.5 F (38.1 C)  TempSrc:    Oral  SpO2: 96% 98% 97%   Weight:      Height:        Constitutional: NAD, calm, comfortable, sitting up in bed Vitals:   03/02/16 1815 03/02/16 1830 03/02/16 1900 03/02/16 1922  BP: 155/92 153/90 145/79   Pulse: 118 (!) 123 (!) 121   Resp: '24 19 22   '$ Temp:    100.5 F (38.1 C)  TempSrc:    Oral  SpO2: 96% 98% 97%   Weight:      Height:       Eyes: Anicteric sclerae, conjunctival injection ENMT: Mucous membranes are dry. Poor dentition Respiratory: clear to auscultation bilaterally, no wheezing, no crackles. Normal respiratory effort. No accessory muscle use.  Cardiovascular: tachycardic rate and  somewhat irregular rhythm, no murmurs / rubs / gallops. No pitting edema to ankles Abdomen: no tenderness,  Bowel sounds positive. No distention Musculoskeletal: no clubbing / cyanosis. He has onychomycosis of the nails, legs are warm and well perfused, no wounds or rashes Skin: Severe onychomycosis of the nails, some discoloration of skin on right medial foot.  He has a fluid filled blister  on right medial ankle which he states has been there for a while and does not hurt.  Tense bullae.   Neurologic: CN 2-12 grossly intact.  Moving all extremities, sensation intact to light touch.   Psychiatric: Normal judgment and insight. Alert and oriented x 3. Normal mood.  GU: No CVA tenderness   Labs on Admission: I have personally reviewed following labs and imaging studies  CBC:  Recent Labs Lab 03/02/16 1531  WBC 5.5  NEUTROABS 4.7  HGB 14.1  HCT 42.4  MCV 85.0  PLT 932   Basic Metabolic Panel:  Recent Labs Lab 03/02/16 1531  NA 137  K 4.9  CL 104  CO2 23  GLUCOSE 126*  BUN 24*  CREATININE 1.44*  CALCIUM 8.8*   GFR: Estimated Creatinine Clearance: 67.5 mL/min (by C-G formula based on SCr of 1.44 mg/dL (H)). Liver Function Tests: No results for input(s): AST, ALT, ALKPHOS, BILITOT, PROT, ALBUMIN in the last 168 hours. No results for input(s): LIPASE, AMYLASE in the last 168 hours. No results for input(s): AMMONIA in the last 168 hours. Coagulation Profile: No results for input(s): INR, PROTIME in the last 168 hours. Cardiac Enzymes: No results for input(s): CKTOTAL, CKMB, CKMBINDEX, TROPONINI in the last 168 hours. BNP (last 3 results) No results for input(s): PROBNP in the last 8760 hours. HbA1C: No results for input(s): HGBA1C in the last 72 hours. CBG:  Recent Labs Lab 03/02/16 1527  GLUCAP 114*   Lipid Profile: No results for input(s): CHOL, HDL, LDLCALC, TRIG, CHOLHDL, LDLDIRECT in the last 72 hours. Thyroid Function Tests: No results for input(s): TSH,  T4TOTAL, FREET4, T3FREE, THYROIDAB in the last 72 hours. Anemia Panel: No results for input(s): VITAMINB12, FOLATE, FERRITIN, TIBC, IRON, RETICCTPCT in the last 72 hours. Urine analysis:    Component Value Date/Time   COLORURINE YELLOW 03/02/2016 1724   APPEARANCEUR HAZY (A) 03/02/2016 1724   LABSPEC 1.011 03/02/2016 1724   PHURINE 5.0 03/02/2016 1724   GLUCOSEU NEGATIVE 03/02/2016 1724   HGBUR MODERATE (A) 03/02/2016 1724   HGBUR large 10/22/2007 0921   BILIRUBINUR NEGATIVE 03/02/2016 1724   KETONESUR NEGATIVE 03/02/2016 1724   PROTEINUR 30 (A) 03/02/2016 1724   UROBILINOGEN 1.0 10/25/2012 0240   NITRITE NEGATIVE 03/02/2016 1724   LEUKOCYTESUR LARGE (A) 03/02/2016 1724     Radiological Exams on Admission: Dg Chest 2 View  Result Date: 03/02/2016 CLINICAL DATA:  Cough.  Chest pain.  Fever. EXAM: CHEST  2 VIEW COMPARISON:  01/03/2016 chest radiograph. FINDINGS: Sternotomy wires appear aligned and intact. CABG clips overlie the mediastinum. Stable cardiomediastinal silhouette with normal heart size. No pneumothorax. No pleural effusion. Lungs appear clear, with no acute consolidative airspace disease and no pulmonary edema. IMPRESSION: No active cardiopulmonary disease. Electronically Signed   By: Ilona Sorrel M.D.   On: 03/02/2016 16:35    EKG: Independently reviewed. Tachycardia  Assessment/Plan  Atypical chest pain in setting of Coronary atherosclerosis, s/p CABG in 2009  - Some features typical, some atypical - First TnI normal - Trend TnI - he has been without plavix for 1 month and previously had in stent thrombosis - Restart plavix/aspirin/imdur - Hold other antihypertensives given presumed sepsis below - AM EKG - Check A1C, lipid panel - Telemetry - tachycardia is improving with fluids.  If remains normotensive, consider restarting beta blocker  Sepsis - 2 SIRS criteria, presumed source urine - I am not convinced a urinary source is the culprit with no urinary  symptoms - UC, BC X  2 pending - Repeat UA in the morning - He received Vanc/Zosyn in the ED, change to rocephin for UTI treatment - Check flu panel (no cough, but wife recently ill) - Monitor temperature - Tylenol for fever - Lactic acid 3.4, trend - IVF bolus in the ED, now on 125cc/hr of NS  AKI - Cr up to 1.44, up from around 1.2 in December - IVF as noted above, likely related to acute illness - Repeat BMET in the AM  Type 2 diabetes mellitus without complication, without long-term current use of insulin - Check A1C - Has not had metformin for 1 month - SSI - Heart Healthy/carb mod diet    HTN (hypertension) - mildly hypertensive currently, given sepsis, medications are on hold - He has been out of medications for 1 month, restart in stages, consider coreg first as he has chest pain as noted above    Claudication - Not an active issue.  He will need adequate medical management of CVD on discharge   DVT prophylaxis: Lovenox Code Status: Full  Disposition Plan: Likely discharge in 1-2 days, will need help with medications  Admission status: Inpatient, telemetry   Gilles Chiquito MD [facility] Pager [phone]  If 7PM-7AM, please contact night-coverage [email or web address] Password [facility]  03/02/2016, 7:37 PM

## 2016-03-02 NOTE — ED Provider Notes (Signed)
[place] DEPT Provider Note   CSN: [id number] Arrival date & time: 03/02/16  1509     History   Chief Complaint Chief Complaint  Patient presents with  . Chest Pain    HPI [name] is a 61 y.o. male.  HPI   Pt is a 61 yo male with PMH of HTN, DM, HLD, CAD, PCI (DES to p/d LCX in 07/2006, DES to RCA/RI in 08/2007) and multivessel CABGx5 in 11/2007, STEMI in 2014 (felt to be due to occlusion of SVG-D1) who presents to the ED via EMS from home with complaint of CP, onset 2pm. Pt reports while he was sitting at home resting he began to have constant sharp midsternal CP. Denies radiation. Pt reports his CP felt similar to episodes of CP he has had over the past few months. He reports taking 4 baby aspirin and 1 SL nitro with complete resolution of CP. Pt also reports this afternoon he began to feel like he had a fever and associated chills. Denies HA, nasal congestion, rhinorrhea, sore throat, cough, SOB, wheezing, palpitations, abdominal pain, N/V/D, urinary sxs, diaphoresis, numbness, tingling, weakness. Pt reports he has been out of his home medications for weeks and states he has not been able to fill his prescriptions since he was last discharged from the hospital in early December.   Past Medical History:  Diagnosis Date  . Claudication ([place])   . Coronary artery disease    a. 08/2007 s/p DES to RCA/RI;  b. 07/2006 s/p DES to p/d LCX;  c. 11/2007 CABGx5 (LIMA->LAD, VG->D1, LRA->OM1, VG->PDA->LPL;  d. 09/2012 STEMI/Cath: LM nl, LAD 100p, LCX 70p/90d, OM1 95, LPL 90d, RCA 100, LIMA->LAD nl (LAD 95d), RA->OM nl, VG->PDA->LPL 100, unable to locate VG->Diag, EF 60-65%->Med Rx. e. 03/2015: DES to mid-Cx in the setting of NSTEMI  . Diabetes mellitus without complication ([place])    a. A1c 7.0 09/2012.  Marland Kitchen Hyperlipidemia   . Hypertension   . Noncompliance     Patient Active Problem List   Diagnosis Date Noted  . Hyponatremia 01/03/2016  . Hypertensive emergency   . NSTEMI (non-ST  elevated myocardial infarction) ([place]) 04/04/2015  . HTN (hypertension) 04/04/2015  . Hypertensive urgency 03/25/2014  . Noncompliance 03/25/2014  . Claudication ([place]) 10/28/2012  . GERD 10/23/2007  . NEPHROLITHIASIS 10/23/2007  . HYPERCHOLESTEROLEMIA 10/22/2007  . ADJUSTMENT DISORDER WITH DEPRESSED MOOD 10/22/2007  . Type 2 diabetes mellitus without complication, without long-term current use of insulin ([place]) 01/29/2003  . Coronary atherosclerosis, s/p CABG in 2009  01/28/2001    Past Surgical History:  Procedure Laterality Date  . CARDIAC CATHETERIZATION N/A 04/06/2015   Procedure: Left Heart Cath and Cors/Grafts Angiography;  Surgeon: Lorretta Harp, MD;  Location: [place] [facility] LAB;  Service: Cardiovascular;  Laterality: N/A;  . CARDIAC CATHETERIZATION N/A 04/06/2015   Procedure: Coronary Stent Intervention;  Surgeon: Lorretta Harp, MD;  2.5 mm x 12 mm long Synergy DES followed by  2.5 mm x 16 mm long Synergy DES    . CORONARY ARTERY BYPASS GRAFT     2009 LIMA to LAD, SVG to Diag, SVG to PDA and PL, left radial to OM  . LEFT HEART CATH N/A 10/24/2012   Procedure: LEFT HEART CATH;  Surgeon: Troy Sine, MD;  Location[facility];  Service: Cardiovascular;  Laterality: N/A;       Home Medications    Prior to Admission medications   Medication Sig Start Date End Date Taking? Authorizing Provider  amLODipine (NORVASC) 10 MG tablet Take 1 tablet (10 mg total) by mouth daily. 01/04/16  Yes Reyne Dumas, MD  aspirin EC 81 MG EC tablet Take 1 tablet (81 mg total) by mouth daily. 10/28/12  Yes Rogelia Mire, NP  atorvastatin (LIPITOR) 80 MG tablet Take 1 tablet (80 mg total) by mouth daily. 01/04/16  Yes Reyne Dumas, MD  carvedilol (COREG) 25 MG tablet Take 1 tablet (25 mg total) by mouth 2 (two) times daily with a meal. 01/04/16  Yes Reyne Dumas, MD  clopidogrel (PLAVIX) 75 MG tablet Take 1 tablet (75 mg total) by mouth daily. 01/04/16  Yes Reyne Dumas, MD  isosorbide  mononitrate (IMDUR) 30 MG 24 hr tablet Take 1 tablet (30 mg total) by mouth daily. 01/04/16  Yes Reyne Dumas, MD  losartan (COZAAR) 25 MG tablet Take 1 tablet (25 mg total) by mouth daily. 01/04/16  Yes Reyne Dumas, MD  metFORMIN (GLUCOPHAGE) 500 MG tablet Take 1 tablet (500 mg total) by mouth 2 (two) times daily with a meal. 12/24/15  Yes Eileen Stanford, PA-C  nitroGLYCERIN (NITROSTAT) 0.4 MG SL tablet Place 1 tablet (0.4 mg total) under the tongue every 5 (five) minutes as needed for chest pain. 04/17/15  Yes Rhonda G Barrett, PA-C  glucose monitoring kit (FREESTYLE) monitoring kit 1 each by Does not apply route once. Glucometer with test strips and lancets to check blood glucose BID ac breakfast and dinner. 10/28/12   Rogelia Mire, NP    Family History Family History  Problem Relation Age of Onset  . Heart failure Mother   . Cancer Mother   . CAD Mother 46  . Heart failure Father   . Kidney failure Brother     Social History Social History  Substance Use Topics  . Smoking status: Former Smoker    Quit date: 10/25/2010  . Smokeless tobacco: Never Used  . Alcohol use No     Comment: sts he drank for the first time in a long time last night      Allergies   Patient has no known allergies.   Review of Systems Review of Systems  Constitutional: Positive for chills and fever (subjective).  Cardiovascular: Positive for chest pain.  All other systems reviewed and are negative.    Physical Exam Updated Vital Signs BP 155/92   Pulse 118   Temp 103 F (39.4 C) (Oral)   Resp 24   Ht '6\' 1"'$  (1.854 m)   Wt 96.2 kg   SpO2 96%   BMI 27.97 kg/m   Physical Exam  Constitutional: He is oriented to person, place, and time. He appears well-developed and well-nourished.  HENT:  Head: Normocephalic and atraumatic.  Mouth/Throat: Uvula is midline, oropharynx is clear and moist and mucous membranes are normal. No oropharyngeal exudate, posterior oropharyngeal edema, posterior  oropharyngeal erythema or tonsillar abscesses. No tonsillar exudate.  Eyes: Conjunctivae and EOM are normal. Right eye exhibits no discharge. Left eye exhibits no discharge. No scleral icterus.  Neck: Normal range of motion. Neck supple.  Cardiovascular: Regular rhythm, normal heart sounds and intact distal pulses.   Tachycardic, HR 130  Pulmonary/Chest: Effort normal and breath sounds normal. No respiratory distress. He has no wheezes. He has no rales. He exhibits no tenderness.  Abdominal: Soft. Bowel sounds are normal. He exhibits no distension and no mass. There is no tenderness. There is no rebound and no guarding. No hernia.  No CVA tenderness  Musculoskeletal: Normal range of motion. He  exhibits no edema.  Neurological: He is alert and oriented to person, place, and time.  Skin: Skin is warm and dry.  Nursing note and vitals reviewed.    ED Treatments / Results  Labs (all labs ordered are listed, but only abnormal results are displayed) Labs Reviewed  CBC WITH DIFFERENTIAL/PLATELET - Abnormal; Notable for the following:       Result Value   Monocytes Absolute 0.0 (*)    All other components within normal limits  BASIC METABOLIC PANEL - Abnormal; Notable for the following:    Glucose, Bld 126 (*)    BUN 24 (*)    Creatinine, Ser 1.44 (*)    Calcium 8.8 (*)    GFR calc non Af Amer 52 (*)    GFR calc Af Amer 60 (*)    All other components within normal limits  URINALYSIS, ROUTINE W REFLEX MICROSCOPIC - Abnormal; Notable for the following:    APPearance HAZY (*)    Hgb urine dipstick MODERATE (*)    Protein, ur 30 (*)    Leukocytes, UA LARGE (*)    Bacteria, UA MANY (*)    Non Squamous Epithelial 6-30 (*)    All other components within normal limits  CBG MONITORING, ED - Abnormal; Notable for the following:    Glucose-Capillary 114 (*)    All other components within normal limits  I-STAT CG4 LACTIC ACID, ED - Abnormal; Notable for the following:    Lactic Acid, Venous  3.23 (*)    All other components within normal limits  CULTURE, BLOOD (ROUTINE X 2)  CULTURE, BLOOD (ROUTINE X 2)  URINE CULTURE  I-STAT TROPOININ, ED    EKG  EKG Interpretation  Date/Time:  Saturday March 02 2016 15:20:31 EST Ventricular Rate:  131 PR Interval:    QRS Duration: 100 QT Interval:  301 QTC Calculation: 445 R Axis:   90 Text Interpretation:  Sinus tachycardia Borderline right axis deviation Non-specific intra-ventricular conduction block Non-specific ST-t changes Confirmed by Ashok Cordia  MD, Lennette Bihari ([id number]) on 03/02/2016 3:22:40 PM       Radiology Dg Chest 2 View  Result Date: 03/02/2016 CLINICAL DATA:  Cough.  Chest pain.  Fever. EXAM: CHEST  2 VIEW COMPARISON:  01/03/2016 chest radiograph. FINDINGS: Sternotomy wires appear aligned and intact. CABG clips overlie the mediastinum. Stable cardiomediastinal silhouette with normal heart size. No pneumothorax. No pleural effusion. Lungs appear clear, with no acute consolidative airspace disease and no pulmonary edema. IMPRESSION: No active cardiopulmonary disease. Electronically Signed   By: Ilona Sorrel M.D.   On: 03/02/2016 16:35    Procedures .Critical Care Performed by: Nona Dell Authorized by: Nona Dell   Critical care provider statement:    Critical care time (minutes):  40   Critical care was necessary to treat or prevent imminent or life-threatening deterioration of the following conditions:  Sepsis   Critical care was time spent personally by me on the following activities:  Blood draw for specimens, development of treatment plan with patient or surrogate, discussions with consultants, evaluation of patient's response to treatment, examination of patient, obtaining history from patient or surrogate, ordering and performing treatments and interventions, ordering and review of laboratory studies, ordering and review of radiographic studies, pulse oximetry, re-evaluation of patient's  condition and review of old charts   (including critical care time)  Medications Ordered in ED Medications  vancomycin (VANCOCIN) 1,500 mg in sodium chloride 0.9 % 500 mL IVPB (1,500 mg Intravenous New Bag/Given 03/02/16 1744)  0.9 %  sodium chloride infusion (1,000 mLs Intravenous New Bag/Given 03/02/16 1819)  amLODipine (NORVASC) tablet 10 mg (10 mg Oral Given 03/02/16 1614)  losartan (COZAAR) tablet 25 mg (25 mg Oral Given 03/02/16 1654)  acetaminophen (TYLENOL) tablet 1,000 mg (1,000 mg Oral Given 03/02/16 1644)  sodium chloride 0.9 % bolus 500 mL (0 mLs Intravenous Stopped 03/02/16 1747)  piperacillin-tazobactam (ZOSYN) IVPB 3.375 g (3.375 g Intravenous New Bag/Given 03/02/16 1739)     Initial Impression / Assessment and Plan / ED Course  I have reviewed the triage vital signs and the nursing notes.  Pertinent labs & imaging results that were available during my care of the patient were reviewed by me and considered in my medical decision making (see chart for details).     Patient presents with chest pain that started this afternoon which he states has since completely resolved after 1 dose of nitroglycerin given by EMS. Reports chest pain felt similar to episodes he has been having over the past few months. Patient states he has been out of his home medications for the past month and was unable to get them refilled after being discharged from his last hospitalization last month due to financial issues. Patient also reports having subjective fever and chills over the past few days. Denies any other symptoms. Initial vitals revealed temp 100.2, heart rate 132. Exam unremarkable. Lungs clear to auscultation bilaterally. Abdominal exam benign.  EKG showed sinus tachycardia with no acute ischemic changes. Initial troponin negative. Chest x-ray negative. Creatinine 1.44, BUN 24. I was notified by nurse on reevaluation patient was noted to have a rectal temp of 104. HR 126. Patient given Tylenol and  sepsis orders placed. Pt started on broad spectrum abx. Lactic acid 3.23. Patient given IV fluids. UA consistent with UTI, urine culture sent.   Sepsis - Repeat Assessment  Performed at:    6:30pm  Vitals     Blood pressure 155/92, pulse 118, temperature 103 F (39.4 C), temperature source Oral, resp. rate 24, height '6\' 1"'$  (1.854 m), weight 96.2 kg, SpO2 96 %.  Heart:     Tachycardic and HR 118  Lungs:    CTA  Capillary Refill:   <2 sec  Peripheral Pulse:   Radial pulse palpable  Skin:     Normal Color    Consulted hospitalist for admission.  Dr. Daryll Drown agrees to admission. Discussed results and plan for admission with pt.   Final Clinical Impressions(s) / ED Diagnoses   Final diagnoses:  Chest pain, unspecified type  Sepsis, due to unspecified organism [facility])  Urinary tract infection with hematuria, site unspecified    New Prescriptions New Prescriptions   No medications on file     Nona Dell, PA-C 03/02/16 [place], MD 03/03/16 [phone]

## 2016-03-02 NOTE — Progress Notes (Addendum)
ANTICOAGULATION CONSULT NOTE - Initial Consult  Pharmacy Consult for Heparin  Indication: chest pain/ACS  No Known Allergies  Patient Measurements: Height: 6\' 1"  (185.4 cm) Weight: 212 lb (96.2 kg) IBW/kg (Calculated) : 79.9  Vital Signs: Temp: 98.4 F (36.9 C) (02/03 2102) Temp Source: Oral (02/03 1922) BP: 136/76 (02/03 2102) Pulse Rate: 110 (02/03 2102)  Labs:  Recent Labs  03/02/16 1531 03/02/16 2211  HGB 14.1 12.2*  HCT 42.4 36.3*  PLT 157 128*  APTT  --  40*  LABPROT  --  16.0*  INR  --  1.28  CREATININE 1.44* 1.90*  TROPONINI  --  0.46*    Estimated Creatinine Clearance: 51.2 mL/min (by C-G formula based on SCr of 1.9 mg/dL (H)).   Medical History: Past Medical History:  Diagnosis Date  . Claudication (HCC)   . Coronary artery disease    a. 08/2007 s/p DES to RCA/RI;  b. 07/2006 s/p DES to p/d LCX;  c. 11/2007 CABGx5 (LIMA->LAD, VG->D1, LRA->OM1, VG->PDA->LPL;  d. 09/2012 STEMI/Cath: LM nl, LAD 100p, LCX 70p/90d, OM1 95, LPL 90d, RCA 100, LIMA->LAD nl (LAD 95d), RA->OM nl, VG->PDA->LPL 100, unable to locate VG->Diag, EF 60-65%->Med Rx. e. 03/2015: DES to mid-Cx in the setting of NSTEMI  . Diabetes mellitus without complication (HCC)    a. A1c 7.0 09/2012.  Marland Kitchen. Hyperlipidemia   . Hypertension   . Noncompliance     Assessment: 61 y/o M with CP to start heparin, hx stent placement, Hgb 12.2, noted renal dysfunction, PTA meds reviewed.   Goal of Therapy:  Heparin level 0.3-0.7 units/ml Monitor platelets by anticoagulation protocol: Yes   Plan:  DC Lovenox Heparin 3000 units BOLUS (slightly reduced bolus with recent Lovenox) Start heparin drip at 1200 units/hr 0800 HL Daily CBC/HL Monitor for bleeding   Abran DukeLedford, Cristy Colmenares 03/02/2016,11:56 PM

## 2016-03-03 ENCOUNTER — Encounter (HOSPITAL_COMMUNITY): Payer: Self-pay | Admitting: Nurse Practitioner

## 2016-03-03 ENCOUNTER — Inpatient Hospital Stay (HOSPITAL_COMMUNITY): Payer: Medicare Other

## 2016-03-03 DIAGNOSIS — R079 Chest pain, unspecified: Secondary | ICD-10-CM

## 2016-03-03 DIAGNOSIS — R748 Abnormal levels of other serum enzymes: Secondary | ICD-10-CM

## 2016-03-03 LAB — ECHOCARDIOGRAM COMPLETE
E decel time: 313 msec
FS: 30 % (ref 28–44)
Height: 73 in
IVS/LV PW RATIO, ED: 1
LA ID, A-P, ES: 44 mm
LA diam end sys: 44 mm
LA vol A4C: 89.5 ml
LADIAMINDEX: 1.99 cm/m2
LAVOL: 108 mL
LAVOLIN: 48.9 mL/m2
LV TDI E'MEDIAL: 5.11
LV dias vol index: 67 mL/m2
LVDIAVOL: 149 mL (ref 62–150)
LVELAT: 6.96 cm/s
LVOT area: 3.8 cm2
LVOT diameter: 22 mm
MV Annulus VTI: 14.5 cm
MV Dec: 313
MV M vel: 46.9
MVPKEVEL: 1 m/s
Mean grad: 1 mmHg
PW: 15 mm — AB (ref 0.6–1.1)
RV LATERAL S' VELOCITY: 11.5 cm/s
RV TAPSE: 15.2 mm
Reg peak vel: 218 cm/s
TDI e' lateral: 6.96
TRMAXVEL: 218 cm/s
WEIGHTICAEL: 3392 [oz_av]

## 2016-03-03 LAB — LIPID PANEL
CHOLESTEROL: 138 mg/dL (ref 0–200)
HDL: 28 mg/dL — ABNORMAL LOW (ref >40)
LDL Cholesterol: 100 mg/dL — ABNORMAL HIGH (ref 0–99)
Total CHOL/HDL Ratio: 4.9 RATIO
Triglycerides: 48 mg/dL (ref <150)
VLDL: 10 mg/dL (ref 0–40)

## 2016-03-03 LAB — BLOOD CULTURE ID PANEL (REFLEXED)
Acinetobacter baumannii: NOT DETECTED
CANDIDA PARAPSILOSIS: NOT DETECTED
CANDIDA TROPICALIS: NOT DETECTED
CARBAPENEM RESISTANCE: NOT DETECTED
Candida albicans: NOT DETECTED
Candida glabrata: NOT DETECTED
Candida krusei: NOT DETECTED
ENTEROBACTER CLOACAE COMPLEX: NOT DETECTED
Enterobacteriaceae species: DETECTED — AB
Enterococcus species: NOT DETECTED
Escherichia coli: DETECTED — AB
HAEMOPHILUS INFLUENZAE: NOT DETECTED
KLEBSIELLA PNEUMONIAE: NOT DETECTED
Klebsiella oxytoca: NOT DETECTED
Listeria monocytogenes: NOT DETECTED
NEISSERIA MENINGITIDIS: NOT DETECTED
PROTEUS SPECIES: NOT DETECTED
Pseudomonas aeruginosa: NOT DETECTED
STAPHYLOCOCCUS AUREUS BCID: NOT DETECTED
STAPHYLOCOCCUS SPECIES: NOT DETECTED
STREPTOCOCCUS AGALACTIAE: NOT DETECTED
Serratia marcescens: NOT DETECTED
Streptococcus pneumoniae: NOT DETECTED
Streptococcus pyogenes: NOT DETECTED
Streptococcus species: NOT DETECTED

## 2016-03-03 LAB — HEPARIN LEVEL (UNFRACTIONATED)
HEPARIN UNFRACTIONATED: 0.67 [IU]/mL (ref 0.30–0.70)
Heparin Unfractionated: 0.56 IU/mL (ref 0.30–0.70)

## 2016-03-03 LAB — GLUCOSE, CAPILLARY
GLUCOSE-CAPILLARY: 91 mg/dL (ref 65–99)
GLUCOSE-CAPILLARY: 120 mg/dL — AB (ref 65–99)
GLUCOSE-CAPILLARY: 93 mg/dL (ref 65–99)
Glucose-Capillary: 84 mg/dL (ref 65–99)
Glucose-Capillary: 80 mg/dL (ref 65–99)

## 2016-03-03 LAB — LACTIC ACID, PLASMA: Lactic Acid, Venous: 2.4 mmol/L (ref 0.5–1.9)

## 2016-03-03 LAB — TROPONIN I
Troponin I: 0.7 ng/mL (ref <0.03)
Troponin I: 1.12 ng/mL (ref <0.03)

## 2016-03-03 MED ORDER — HEPARIN (PORCINE) IN NACL 100-0.45 UNIT/ML-% IJ SOLN
2150.0000 [IU]/h | INTRAMUSCULAR | Status: DC
  Administered 2016-03-03 (×2): 1200 [IU]/h via INTRAVENOUS
  Administered 2016-03-05: 1750 [IU]/h via INTRAVENOUS
  Filled 2016-03-03 (×6): qty 250

## 2016-03-03 MED ORDER — DEXTROSE 5 % IV SOLN
1.0000 g | Freq: Every day | INTRAVENOUS | Status: DC
  Administered 2016-03-03: 1 g via INTRAVENOUS
  Filled 2016-03-03: qty 10

## 2016-03-03 MED ORDER — DEXTROSE 5 % IV SOLN
2.0000 g | INTRAVENOUS | Status: DC
  Administered 2016-03-03 – 2016-03-07 (×5): 2 g via INTRAVENOUS
  Filled 2016-03-03 (×6): qty 2

## 2016-03-03 MED ORDER — HEPARIN BOLUS VIA INFUSION
3000.0000 [IU] | Freq: Once | INTRAVENOUS | Status: AC
  Administered 2016-03-03: 3000 [IU] via INTRAVENOUS
  Filled 2016-03-03: qty 3000

## 2016-03-03 NOTE — Progress Notes (Signed)
PHARMACY - PHYSICIAN COMMUNICATION CRITICAL VALUE ALERT - BLOOD CULTURE IDENTIFICATION (BCID)  Results for orders placed or performed during the hospital encounter of 03/02/16  Blood Culture ID Panel (Reflexed) (Collected: 03/02/2016  5:30 PM)  Result Value Ref Range   Enterococcus species NOT DETECTED NOT DETECTED   Listeria monocytogenes NOT DETECTED NOT DETECTED   Staphylococcus species NOT DETECTED NOT DETECTED   Staphylococcus aureus NOT DETECTED NOT DETECTED   Streptococcus species NOT DETECTED NOT DETECTED   Streptococcus agalactiae NOT DETECTED NOT DETECTED   Streptococcus pneumoniae NOT DETECTED NOT DETECTED   Streptococcus pyogenes NOT DETECTED NOT DETECTED   Acinetobacter baumannii NOT DETECTED NOT DETECTED   Enterobacteriaceae species DETECTED (A) NOT DETECTED   Enterobacter cloacae complex NOT DETECTED NOT DETECTED   Escherichia coli DETECTED (A) NOT DETECTED   Klebsiella oxytoca NOT DETECTED NOT DETECTED   Klebsiella pneumoniae NOT DETECTED NOT DETECTED   Proteus species NOT DETECTED NOT DETECTED   Serratia marcescens NOT DETECTED NOT DETECTED   Carbapenem resistance NOT DETECTED NOT DETECTED   Haemophilus influenzae NOT DETECTED NOT DETECTED   Neisseria meningitidis NOT DETECTED NOT DETECTED   Pseudomonas aeruginosa NOT DETECTED NOT DETECTED   Candida albicans NOT DETECTED NOT DETECTED   Candida glabrata NOT DETECTED NOT DETECTED   Candida krusei NOT DETECTED NOT DETECTED   Candida parapsilosis NOT DETECTED NOT DETECTED   Candida tropicalis NOT DETECTED NOT DETECTED    Name of physician (or Provider) Contacted: Penny Piarlando Vega, MD  Changes to prescribed antibiotics required: Increase dose of Ceftriaxone to 2g IV every 24 hours for GNR bacteremia.   Alfredo BachJoseph Ashantae Pangallo, Cleotis NipperBS, PharmD Clinical Pharmacy Resident [phone] (Pager) 03/03/2016 7:23 AM

## 2016-03-03 NOTE — Progress Notes (Signed)
PROGRESS NOTE    Darrell [name]  OZH:[id number] DOB: 03/02/1955 DOA: 03/02/2016 PCP: No PCP Per Patient   Brief Narrative: 61 y.o. male with medical history significant of HTN, HLD, DM2, CAD, claudication who presents for chest pain.  Darrell [name] was admitted in November of 2017 for an NSTEMI and was treated with medical management.    ED, the patient was found to be tachycardic and febrile to 104.  He had a normal CXR, US showed many bacteria  And leukocytes.   Assessment & Plan:   Active Problems  Sepsis (HCC) - Monitor culture results - Continue antibiotic regimen.  Bacteremia - d/c pharmacist. Increased Rocephin dose. Patient will require 14 days of antibiotic therapy    Type 2 diabetes mellitus without complication, without long-term current use of insulin (HCC) - managed with diet.  Chest pain - Troponin rising. On Heparin - will consult Cardiology. Further interventions may be halted by active bacteremic state    Coronary atherosclerosis, s/p CABG in 2009  - according to HPI patient last had stent placed in March of 2017. He has gone one month off imdur and plavix reportedly.    HTN (hypertension) -  Pt on imdur  DVT prophylaxis: on Heparin Code Status: Full Family Communication: none at bedside Disposition Plan: pending improvement in condition   Consultants:   Consult Cardiology   Procedures: none   Antimicrobials: rocephin   Subjective: Pt has no new complaints. Still having chills.   Objective: Vitals:   03/02/16 2030 03/02/16 2102 03/02/16 2112 03/03/16 0445  BP: 121/69 136/76  106/72  Pulse: 108 (!) 110  81  Resp: (!) 28 19  19   Temp:  98.4 F (36.9 C)  98.2 F (36.8 C)  TempSrc:      SpO2: 94% 97%  98%  Weight:   96.2 kg (212 lb)   Height:   6\' 1"  (1.854 m)     Intake/Output Summary (Last 24 hours) at 03/03/16 1320 Last data filed at 03/03/16 0616  Gross per 24 hour  Intake              550 ml  Output              300 ml  Net               250 ml   Filed Weights   03/02/16 1520 03/02/16 2112  Weight: 96.2 kg (212 lb) 96.2 kg (212 lb)    Examination:  General exam: Appears calm and comfortable, in nad. Respiratory system: Clear to auscultation. Respiratory effort normal. Cardiovascular system: S1 & S2 heard, RRR. No rubs Gastrointestinal system: Abdomen is nondistended, soft and nontender. No organomegaly or masses felt. Normal bowel sounds heard. Central nervous system: Alert and oriented. No focal neurological deficits. Extremities: Symmetric 5 x 5 power. Skin: No rashes, lesions or ulcers, on limited exam. Psychiatry: Judgement and insight appear normal. Mood & affect appropriate.   Data Reviewed: I have personally reviewed following labs and imaging studies  CBC:  Recent Labs Lab 03/02/16 1531 03/02/16 2211  WBC 5.5 14.7*  NEUTROABS 4.7  --   HGB 14.1 12.2*  HCT 42.4 36.3*  MCV 85.0 83.8  PLT 157 128*   Basic Metabolic Panel:  Recent Labs Lab 03/02/16 1531 03/02/16 2211  NA 137  --   K 4.9  --   CL 104  --   CO2 23  --   GLUCOSE 126*  --   BUN 24*  --  CREATININE 1.44* 1.90*  CALCIUM 8.8*  --    GFR: Estimated Creatinine Clearance: 51.2 mL/min (by C-G formula based on SCr of 1.9 mg/dL (H)). Liver Function Tests:  Recent Labs Lab 03/02/16 2211  AST 26  ALT 13*  ALKPHOS 60  BILITOT 1.2  PROT 5.9*  ALBUMIN 2.6*   No results for input(s): LIPASE, AMYLASE in the last 168 hours. No results for input(s): AMMONIA in the last 168 hours. Coagulation Profile:  Recent Labs Lab 03/02/16 2211  INR 1.28   Cardiac Enzymes:  Recent Labs Lab 03/02/16 2211 03/03/16 0235 03/03/16 0820  TROPONINI 0.46* 0.70* 1.12*   BNP (last 3 results) No results for input(s): PROBNP in the last 8760 hours. HbA1C: No results for input(s): HGBA1C in the last 72 hours. CBG:  Recent Labs Lab 03/02/16 2144 03/02/16 2323 03/03/16 0341 03/03/16 0805 03/03/16 1133  GLUCAP 151* 125* 91  84 80   Lipid Profile:  Recent Labs  03/03/16 0235  CHOL 138  HDL 28*  LDLCALC 100*  TRIG 48  CHOLHDL 4.9   Thyroid Function Tests: No results for input(s): TSH, T4TOTAL, FREET4, T3FREE, THYROIDAB in the last 72 hours. Anemia Panel: No results for input(s): VITAMINB12, FOLATE, FERRITIN, TIBC, IRON, RETICCTPCT in the last 72 hours. Sepsis Labs:  Recent Labs Lab 03/02/16 1738 03/02/16 2020 03/02/16 2211 03/03/16 0235  LATICACIDVEN 3.23* 2.33* 2.7* 2.4*    Recent Results (from the past 240 hour(s))  Culture, blood (routine x 2)     Status: None (Preliminary result)   Collection Time: 03/02/16  5:23 PM  Result Value Ref Range Status   Specimen Description BLOOD RIGHT ANTECUBITAL  Final   Special Requests BOTTLES DRAWN AEROBIC AND ANAEROBIC 4CC  Final   Culture  Setup Time   Final    IN BOTH AEROBIC AND ANAEROBIC BOTTLES GRAM NEGATIVE RODS    Culture NO GROWTH < 24 HOURS  Final   Report Status PENDING  Incomplete  Culture, blood (routine x 2)     Status: None (Preliminary result)   Collection Time: 03/02/16  5:30 PM  Result Value Ref Range Status   Specimen Description BLOOD LEFT HAND  Final   Special Requests BOTTLES DRAWN AEROBIC AND ANAEROBIC 5CC  Final   Culture  Setup Time   Final    IN BOTH AEROBIC AND ANAEROBIC BOTTLES GRAM NEGATIVE RODS CRITICAL RESULT CALLED TO, READ BACK BY AND VERIFIED WITH: J ARMINGER,PHARMD AT 0716 03/03/16 BY L BENFIELD    Culture GRAM NEGATIVE RODS  Final   Report Status PENDING  Incomplete  Blood Culture ID Panel (Reflexed)     Status: Abnormal   Collection Time: 03/02/16  5:30 PM  Result Value Ref Range Status   Enterococcus species NOT DETECTED NOT DETECTED Final   Listeria monocytogenes NOT DETECTED NOT DETECTED Final   Staphylococcus species NOT DETECTED NOT DETECTED Final   Staphylococcus aureus NOT DETECTED NOT DETECTED Final   Streptococcus species NOT DETECTED NOT DETECTED Final   Streptococcus agalactiae NOT DETECTED NOT  DETECTED Final   Streptococcus pneumoniae NOT DETECTED NOT DETECTED Final   Streptococcus pyogenes NOT DETECTED NOT DETECTED Final   Acinetobacter baumannii NOT DETECTED NOT DETECTED Final   Enterobacteriaceae species DETECTED (A) NOT DETECTED Final    Comment: Enterobacteriaceae represent a large family of gram-negative bacteria, not a single organism. CRITICAL RESULT CALLED TO, READ BACK BY AND VERIFIED WITH: J ARMINGER,PHARMD AT [id number] 03/03/16 BY L BENFIELD    Enterobacter cloacae complex NOT DETECTED NOT  DETECTED Final   Escherichia coli DETECTED (A) NOT DETECTED Final    Comment: CRITICAL RESULT CALLED TO, READ BACK BY AND VERIFIED WITH: J ARMINGER,PHARMD AT 0716 03/03/16 BY L BENFIELD    Klebsiella oxytoca NOT DETECTED NOT DETECTED Final   Klebsiella pneumoniae NOT DETECTED NOT DETECTED Final   Proteus species NOT DETECTED NOT DETECTED Final   Serratia marcescens NOT DETECTED NOT DETECTED Final   Carbapenem resistance NOT DETECTED NOT DETECTED Final   Haemophilus influenzae NOT DETECTED NOT DETECTED Final   Neisseria meningitidis NOT DETECTED NOT DETECTED Final   Pseudomonas aeruginosa NOT DETECTED NOT DETECTED Final   Candida albicans NOT DETECTED NOT DETECTED Final   Candida glabrata NOT DETECTED NOT DETECTED Final   Candida krusei NOT DETECTED NOT DETECTED Final   Candida parapsilosis NOT DETECTED NOT DETECTED Final   Candida tropicalis NOT DETECTED NOT DETECTED Final    Radiology Studies: Dg Chest 2 View  Result Date: 03/02/2016 CLINICAL DATA:  Cough.  Chest pain.  Fever. EXAM: CHEST  2 VIEW COMPARISON:  01/03/2016 chest radiograph. FINDINGS: Sternotomy wires appear aligned and intact. CABG clips overlie the mediastinum. Stable cardiomediastinal silhouette with normal heart size. No pneumothorax. No pleural effusion. Lungs appear clear, with no acute consolidative airspace disease and no pulmonary edema. IMPRESSION: No active cardiopulmonary disease. Electronically Signed   By:  Delbert Phenix M.D.   On: 03/02/2016 16:35   Scheduled Meds: . aspirin EC  81 mg Oral Daily  . atorvastatin  80 mg Oral Daily  . cefTRIAXone (ROCEPHIN)  IV  2 g Intravenous Q24H  . clopidogrel  75 mg Oral Daily  . insulin aspart  0-9 Units Subcutaneous Q4H  . isosorbide mononitrate  30 mg Oral Daily  . sodium chloride flush  3 mL Intravenous Q12H   Continuous Infusions: . sodium chloride 1,000 mL (03/03/16 1135)  . heparin 1,200 Units/hr (03/03/16 0036)     LOS: 1 day    Time spent: > 35 minutes  Penny Pia, MD [facility] Pager [phone]  If 7PM-7AM, please contact night-coverage [email or web address] Password [email or web address] 03/03/2016, 1:20 PM

## 2016-03-03 NOTE — Consult Note (Signed)
Cardiology Consult    Patient ID: Darrell [name] MRN: [id number], DOB/AGE: 1955-04-24   Admit date: 03/02/2016 Date of Consult: 03/03/2016  Primary Physician: No PCP Per Patient Primary Cardiologist: J. Ena Demary, MD - never seen in office Requesting Provider: Raymond Gurney. Vega, MD  Patient Profile    61 y/o ? with a h/o CAD s/p prior CABG and multiple interventions, HTN, HL, DM, and noncompliance, who was admitted 2/3 with complaints of chest and back pain assoc with nausea, and has been found to have a mild trop elevation, fever/UTI.  Past Medical History   Past Medical History:  Diagnosis Date  . Claudication (HCC)   . Coronary artery disease    a. 08/2007 s/p DES to RCA/RI;  b. 07/2006 s/p DES to p/d LCX;  c. 11/2007 CABGx5 (LIMA->LAD, VG->D1, LRA->OM1, VG->PDA->LPL);  d. 09/2012 STEMI/Cath: 3VD, LIMA->LAD nl (LAD 95d), RA->OM nl, VG->PDA->LPL 100, unable to locate VG->Diag, EF 60-65%->Med Rx. e. 03/2015: Synergy DES to mid-Cx in the setting of NSTEMI; f. 11/2015 NSTEMI->Med Rx.  . Diabetes mellitus without complication (HCC)    a. A1c 7.0 09/2012.  . Diastolic dysfunction    a. 02/2016 Echo: EF 50-55%, gr1DD, Ao sclerosis, dil Ao root (42mm), sev dil LA, triv TR, PASP 22mmHg.  Marland Kitchen. Hyperlipidemia   . Hypertension   . Noncompliance     Past Surgical History:  Procedure Laterality Date  . CARDIAC CATHETERIZATION N/A 04/06/2015   Procedure: Left Heart Cath and Cors/Grafts Angiography;  Surgeon: Runell GessJonathan J Berry, MD;  [facility];  Service: Cardiovascular;  Laterality: N/A;  . CARDIAC CATHETERIZATION N/A 04/06/2015   Procedure: Coronary Stent Intervention;  Surgeon: Runell GessJonathan J Berry, MD;  2.5 mm x 12 mm long Synergy DES followed by  2.5 mm x 16 mm long Synergy DES    . CORONARY ARTERY BYPASS GRAFT     2009 LIMA to LAD, SVG to Diag, SVG to PDA and PL, left radial to OM  . LEFT HEART CATH N/A 10/24/2012   Procedure: LEFT HEART CATH;  Surgeon: Lennette Biharihomas A Kelly, MD;  [facility];  Service: Cardiovascular;  Laterality: N/A;     Allergies  No Known Allergies  History of Present Illness    61 y/o ? with the above complex PMH including a h/o CAD s/p multiple interventions beginning in 08/2007, with subsequent CABG x 5 in 11/2007.  Since CABG, he has had several MI's with STEMI in 09/2012 (VG  PDA  LPL occluded and unable to locate VG  Diag), NSTEM[facility] in 03[facility]2015 [facility] residual distal LCX dzs - small, not ideal for intervention), and NSTEMI in 11/2015  medically managed (peak trop 2.53).  He was last admitted in December 2017 with chest pain and vagal episode.  Troponin that admission rose to 0.04.  He was medically managed.  He has not f/u with cardiology since his last hospitalization.  He lives locally with his wife.  He is sedentary.  He ran out of his plavix and imdur within the past month.  He was in his usoh until 03/02/2016, when he began to experience sharp sscp and back pain, assoc with nausea.  He denies dyspnea, diaph, or radiation of pain.  He presented to the ED, where he was tachycardic and febrile, up to 104.1. BP 212/124.  CXR was non-acute.  UA with evidence of UTI.  No c/o dysuria.  ECG non-acute.  Initial trop nl.  He was admitted  and is on iv rocephin for UTI.  He has had no further chest pain.  Trop has risen -  0.46  0.70  1.12.  Echo performed earlier today and showed nl EF.  We've been asked to eval.   Inpatient Medications    . aspirin EC  81 mg Oral Daily  . atorvastatin  80 mg Oral Daily  . cefTRIAXone (ROCEPHIN)  IV  2 g Intravenous Q24H  . clopidogrel  75 mg Oral Daily  . insulin aspart  0-9 Units Subcutaneous Q4H  . isosorbide mononitrate  30 mg Oral Daily  . sodium chloride flush  3 mL Intravenous Q12H    Family History    Family History  Problem Relation Age of Onset  . Heart failure Mother   . Cancer Mother   . CAD Mother 76  . Heart failure Father   . Kidney failure Brother     Social History    Social  History   Social History  . Marital status: Married    Spouse name: N/A  . Number of children: 3  . Years of education: N/A   Occupational History  . Not on file.   Social History Main Topics  . Smoking status: Former Smoker    Quit date: 10/25/2010  . Smokeless tobacco: Never Used  . Alcohol use No     Comment: sts he drank for the first time in a long time last night   . Drug use: No  . Sexual activity: Not on file   Other Topics Concern  . Not on file   Social History Narrative   Lives at home with wife.       Review of Systems    General:  No chills, fever, night sweats or weight changes.  Cardiovascular:  ++ sharp chest and back pain on 2/3, no dyspnea on exertion, edema, orthopnea, palpitations, paroxysmal nocturnal dyspnea. Dermatological: No rash, lesions/masses Respiratory: No cough, dyspnea Urologic: No hematuria, dysuria Abdominal:   +++ nausea assoc w/ c/p on admission, no vomiting, diarrhea, bright red blood per rectum, melena, or hematemesis Neurologic:  No visual changes, wkns, changes in mental status. All other systems reviewed and are otherwise negative except as noted above.  Physical Exam    Blood pressure 106/72, pulse 81, temperature 98.2 F (36.8 C), resp. rate 19, height 6\' 1"  (1.854 m), weight 212 lb (96.2 kg), SpO2 98 %.  General: Pleasant, NAD Psych: Flat affect. Neuro: Alert and oriented X 3. Moves all extremities spontaneously. HEENT: Normal  Neck: Supple without bruits or JVD. Lungs:  Resp regular and unlabored, diminished breath sounds right base, otw CTA. Heart: RRR no s3, s4, or murmurs. Abdomen: Soft, non-tender, non-distended, BS + x 4.  Extremities: No clubbing, cyanosis or edema. DP/PT/Radials 2+ and equal bilaterally.  Labs    Troponin [facility] of Care Test)  Recent Labs  03/02/16 1534  TROPIPOC 0.01    Recent Labs  03/02/16 2211 03/03/16 0235 03/03/16 0820  TROPONINI 0.46* 0.70* 1.12*   Lab Results  Component  Value Date   WBC 14.7 (H) 03/02/2016   HGB 12.2 (L) 03/02/2016   HCT 36.3 (L) 03/02/2016   MCV 83.8 03/02/2016   PLT 128 (L) 03/02/2016     Recent Labs Lab 03/02/16 1531 03/02/16 2211  NA 137  --   K 4.9  --   CL 104  --   CO2 23  --   BUN 24*  --   CREATININE 1.44* 1.90*  CALCIUM 8.8*  --   PROT  --  5.9*  BILITOT  --  1.2  ALKPHOS  --  60  ALT  --  13*  AST  --  26  GLUCOSE 126*  --    Lab Results  Component Value Date   CHOL 138 03/03/2016   HDL 28 (L) 03/03/2016   LDLCALC 100 (H) 03/03/2016   TRIG 48 03/03/2016   Radiology Studies    Dg Chest 2 View  Result Date: 03/02/2016 CLINICAL DATA:  Cough.  Chest pain.  Fever. EXAM: CHEST  2 VIEW COMPARISON:  01/03/2016 chest radiograph. FINDINGS: Sternotomy wires appear aligned and intact. CABG clips overlie the mediastinum. Stable cardiomediastinal silhouette with normal heart size. No pneumothorax. No pleural effusion. Lungs appear clear, with no acute consolidative airspace disease and no pulmonary edema. IMPRESSION: No active cardiopulmonary disease. Electronically Signed   By: Delbert Phenix M.D.   On: 03/02/2016 16:35    ECG & Cardiac Imaging    Sinus tachycarcia, 131, rightward axis, ivcd, inflat st dep  2D Echocardiogram 2.4.2018  Study Conclusions   - Left ventricle: The cavity size was normal. There was moderate   concentric hypertrophy. Systolic function was normal. The   estimated ejection fraction was in the range of 50% to 55%.   Inferior hypokinesis. Doppler parameters are consistent with   abnormal left ventricular relaxation (grade 1 diastolic   dysfunction). The E/e&' ratio is between 8-15, suggesting   indeterminate LV filling pressure. - Aortic valve: Trileaflet. Sclerosis without stenosis. There was   no regurgitation. - Aorta: Aortic root dimension: 42 mm (ED). - Aortic root: The aortic root is dilated. - Mitral valve: Mildly thickened leaflets . There was trivial   regurgitation. - Left  atrium: Severely dilated. - Right ventricle: The cavity size was mildly dilated. Systolic   function was normal. - Right atrium: The atrium was mildly dilated. - Tricuspid valve: There was trivial regurgitation. - Pulmonary arteries: PA peak pressure: 22 mm Hg (S). - Inferior vena cava: The vessel was normal in size. The   respirophasic diameter changes were in the normal range (= 50%),   consistent with normal central venous pressure.  Assessment & Plan    1.  UTI:  Pt presented with sharp chest and back pain assoc w/ nausea and was found to be febrile (104.1), tachycardic and markedly hypertensive with an initial bp of 212/124.  UA abnl  abx per IM.  Denies dysuria/flank pain.  Feeling better this am overall.  2.  CAD/Elevated troponin:  Pt with lengthy h/o CAD s/p prior CABG and repeat LCX intervention in setting of NSTEMI in 03/2015.  He has known residual distal LAD and LCX dzs (small vessels), with occlusions of the VG  Diag and VG  PDA  LPL.  LIMA  LAD was patent on last cath but inserts into a severely diseased distal LAD.  Radial graft to the OM was patent.  He was readmitted in 11/2015 with c/p and trop elevation to 2.  He was medically managed @ that time.  He now presents again with sharp chest and back pain in the setting of UTI with sinus tachycardia, fever to 104.1, and hypertensive urgency (212/124 in ER).  Troponin has risen and is currently 1.12.  He is currently c/p free.  He admits to noncompliance and has been out of imdur and plavix recently.  Echo performed today and shows low nl EF @ 50-55% with inf HK (known RCA and VG  PDA occlusions).  [name], statin,  blocker, nitrate, [name], and plavix.  Would not pursue ischemic eval @ this point.  3.  Hypertensive Urgency:  BP markedly elevated in ED @ 212/124.  Now stable w/ resumption of home meds.  Suspect he is noncompliant @ home.  4.  HL:  Cont statin.  LDL 100.  5.  DM II:  Per  IM.  Signed, [name], NP 03/03/2016, 2:40 PM  History and all data above reviewed.  Patient examined.  I agree with the findings as above.  The patient presents with back pain and is found to have a UTI.  He did not report chest pain at home.  He apparently described this to the ED provider and was given NTG.  His enzymes were cycled.  They are mildly elevated.  However, since getting NTG yesterday he had had no further chest pain.   Of note he was tachycardic and had significant HTN on presentation.  EKG without acute ST changes.   The patient exam reveals COR:RRR  ,  Lungs: Clear  ,  Abd: Positive bowel sounds, no rebound no guarding, Ext Decreased pulses with trace edema  .  All available labs, radiology testing, previous records reviewed. Agree with documented assessment and plan. CAD:  He has no anginal symptoms.  EKG is not acutely changed from previous.  I did review his cath films.  He has diffuse native vessel disease.  He had three patent grafts at the last cath.  He has had elevated cardiac enzymes higher than the current values during his last admission.  At this point I think the enzyme elevation is secondary to his acute illness.  Given the absence of symptoms I do not think that further invasive or noninvasive testing is indicated.  He has been out of his meds and is no compliant with follow up in our office.  He needs to resume previous meds and keep follow up appts.   [name]  3:33 PM  03/03/2016

## 2016-03-03 NOTE — Progress Notes (Signed)
ANTICOAGULATION CONSULT NOTE  Pharmacy Consult for Heparin  Indication: chest pain/ACS  No Known Allergies  Patient Measurements: Height: 6\' 1"  (185.4 cm) Weight: 212 lb (96.2 kg) IBW/kg (Calculated) : 79.9  Labs:  Recent Labs  03/02/16 1531 03/02/16 2211 03/03/16 0235 03/03/16 0820 03/03/16 1419  HGB 14.1 12.2*  --   --   --   HCT 42.4 36.3*  --   --   --   PLT 157 128*  --   --   --   APTT  --  40*  --   --   --   LABPROT  --  16.0*  --   --   --   INR  --  1.28  --   --   --   HEPARINUNFRC  --   --   --  0.67 0.56  CREATININE 1.44* 1.90*  --   --   --   TROPONINI  --  0.46* 0.70* 1.12*  --     Estimated Creatinine Clearance: 51.2 mL/min (by C-G formula based on SCr of 1.9 mg/dL (H)).  Assessment: 61 y/o M with CP on heparin, hx stent placement and CABG. Repeat heparin level is at goal, 0.56 on 1200 units/hr. No bleeding noted. Medical therapy planned.  Goal of Therapy:  Heparin level 0.3-0.7 units/ml Monitor platelets by anticoagulation protocol: Yes   Plan:  -Continue heparin drip at 1200 units/hr -Daily heparin level and CBC -Monitor for s/sx of bleeding   Loura BackJennifer Angwin, PharmD, BCPS Clinical Pharmacist Main pharmacy - [phone] 03/03/2016 3:26 PM

## 2016-03-03 NOTE — Progress Notes (Signed)
ANTICOAGULATION CONSULT NOTE  Pharmacy Consult for Heparin  Indication: chest pain/ACS  No Known Allergies  Patient Measurements: Height: 6\' 1"  (185.4 cm) Weight: 212 lb (96.2 kg) IBW/kg (Calculated) : 79.9  Vital Signs: Temp: 98.2 F (36.8 C) (02/04 0445) BP: 106/72 (02/04 0445) Pulse Rate: 81 (02/04 0445)  Labs:  Recent Labs  03/02/16 1531 03/02/16 2211 03/03/16 0235 03/03/16 0820  HGB 14.1 12.2*  --   --   HCT 42.4 36.3*  --   --   PLT 157 128*  --   --   APTT  --  40*  --   --   LABPROT  --  16.0*  --   --   INR  --  1.28  --   --   HEPARINUNFRC  --   --   --  0.67  CREATININE 1.44* 1.90*  --   --   TROPONINI  --  0.46* 0.70* 1.12*    Estimated Creatinine Clearance: 51.2 mL/min (by C-G formula based on SCr of 1.9 mg/dL (H)).   Medical History: Past Medical History:  Diagnosis Date  . Claudication (HCC)   . Coronary artery disease    a. 08/2007 s/p DES to RCA/RI;  b. 07/2006 s/p DES to p/d LCX;  c. 11/2007 CABGx5 (LIMA->LAD, VG->D1, LRA->OM1, VG->PDA->LPL;  d. 09/2012 STEMI/Cath: LM nl, LAD 100p, LCX 70p/90d, OM1 95, LPL 90d, RCA 100, LIMA->LAD nl (LAD 95d), RA->OM nl, VG->PDA->LPL 100, unable to locate VG->Diag, EF 60-65%->Med Rx. e. 03/2015: DES to mid-Cx in the setting of NSTEMI  . Diabetes mellitus without complication (HCC)    a. A1c 7.0 09/2012.  Marland Kitchen. Hyperlipidemia   . Hypertension   . Noncompliance     Assessment: 61 y/o M with CP on heparin, hx stent placement and CABG. Initial heparin level is at goal.  Goal of Therapy:  Heparin level 0.3-0.7 units/ml Monitor platelets by anticoagulation protocol: Yes   Plan:  -No heparin changes needed -Will confirm a heparin level today  Darrell GermanAndrew Arlene Brickel, Pharm D 03/03/2016 1:10 PM

## 2016-03-03 NOTE — Progress Notes (Signed)
Patient arrived on the unit via bed alert and oriented x 4.  Pt has Iv to the Right forearm and Right Ac.  Skin assessment complete.  Old surgical scars to mid sternum area and left forearm. Vital signs stable.  So complaints of chest pain at a 2 on scale 0-10.  Will administer nitroglycerin. Educated the patient on how to reach the nurse with the equipment in the room. Lowered the bed, activated the bed alarm and placed the call bell within reach.  Will continue to monitor the patient.

## 2016-03-03 NOTE — Progress Notes (Signed)
CRITICAL VALUE ALERT  Critical value received: Lactic Acid 2.4   Date of notification:  03/03/16  Time of notification:  0322    Critical value read back:Yes.    Nurse who received alert:  Lurlean NannyQuinetta Norman Bier    MD notified (1st page): J.Kim  Time of first page:  0324  MD notified (2nd page):  Time of second page:  Responding MD:  Pixie CasinoJ. Kim  Time MD responded:  [phone]

## 2016-03-04 DIAGNOSIS — I2510 Atherosclerotic heart disease of native coronary artery without angina pectoris: Secondary | ICD-10-CM

## 2016-03-04 LAB — HEPARIN LEVEL (UNFRACTIONATED)
HEPARIN UNFRACTIONATED: 0.14 [IU]/mL — AB (ref 0.30–0.70)
Heparin Unfractionated: 0.14 IU/mL — ABNORMAL LOW (ref 0.30–0.70)

## 2016-03-04 LAB — BASIC METABOLIC PANEL
Anion gap: 10 (ref 5–15)
BUN: 27 mg/dL — ABNORMAL HIGH (ref 6–20)
CALCIUM: 7.2 mg/dL — AB (ref 8.9–10.3)
CO2: 18 mmol/L — AB (ref 22–32)
CREATININE: 1.65 mg/dL — AB (ref 0.61–1.24)
Chloride: 106 mmol/L (ref 101–111)
GFR, EST AFRICAN AMERICAN: 51 mL/min — AB (ref >60)
GFR, EST NON AFRICAN AMERICAN: 44 mL/min — AB (ref >60)
Glucose, Bld: 120 mg/dL — ABNORMAL HIGH (ref 65–99)
Potassium: 3.1 mmol/L — ABNORMAL LOW (ref 3.5–5.1)
SODIUM: 134 mmol/L — AB (ref 135–145)

## 2016-03-04 LAB — CBC
HCT: 31.1 % — ABNORMAL LOW (ref 39.0–52.0)
Hemoglobin: 10.4 g/dL — ABNORMAL LOW (ref 13.0–17.0)
MCH: 28 pg (ref 26.0–34.0)
MCHC: 33.4 g/dL (ref 30.0–36.0)
MCV: 83.8 fL (ref 78.0–100.0)
PLATELETS: 96 10*3/uL — AB (ref 150–400)
RBC: 3.71 MIL/uL — ABNORMAL LOW (ref 4.22–5.81)
RDW: 15.9 % — ABNORMAL HIGH (ref 11.5–15.5)
WBC: 16.4 10*3/uL — AB (ref 4.0–10.5)

## 2016-03-04 LAB — GLUCOSE, CAPILLARY
GLUCOSE-CAPILLARY: 91 mg/dL (ref 65–99)
GLUCOSE-CAPILLARY: 82 mg/dL (ref 65–99)
GLUCOSE-CAPILLARY: 110 mg/dL — AB (ref 65–99)
GLUCOSE-CAPILLARY: 153 mg/dL — AB (ref 65–99)
Glucose-Capillary: 142 mg/dL — ABNORMAL HIGH (ref 65–99)
Glucose-Capillary: 97 mg/dL (ref 65–99)

## 2016-03-04 LAB — HEMOGLOBIN A1C
HEMOGLOBIN A1C: 5.2 % (ref 4.8–5.6)
Mean Plasma Glucose: 103 mg/dL

## 2016-03-04 MED ORDER — HEPARIN BOLUS VIA INFUSION
2000.0000 [IU] | Freq: Once | INTRAVENOUS | Status: AC
  Administered 2016-03-04: 2000 [IU] via INTRAVENOUS
  Filled 2016-03-04: qty 2000

## 2016-03-04 MED ORDER — NITROGLYCERIN 0.4 MG SL SUBL
SUBLINGUAL_TABLET | SUBLINGUAL | Status: AC
  Filled 2016-03-04: qty 1

## 2016-03-04 MED ORDER — NITROGLYCERIN 0.4 MG SL SUBL
0.4000 mg | SUBLINGUAL_TABLET | SUBLINGUAL | Status: DC | PRN
  Administered 2016-03-04 – 2016-03-07 (×5): 0.4 mg via SUBLINGUAL
  Filled 2016-03-04 (×2): qty 1

## 2016-03-04 NOTE — Care Management Note (Signed)
Case Management Note  Patient Details  Name: Manus GunningSteven S Dresser MRN: [id number] Date of Birth: [birth year]  Subjective/Objective:                 Patient from home with wife and son.    Action/Plan:  Anticipate DC to home with HH, [facility] and to [facility]CP. CM will continue to follow.  Expected Discharge Date:                  Expected Discharge Plan:     In-House Referral:     Discharge planning Services  CM Consult, [facility]  Post Acute Care Choice:    Choice offered to:     DME Arranged:    DME Agency:     HH Arranged:    HH Agency:     Status of Service:  In process, will continue to follow  If discussed at Long Length of Stay Meetings, dates discussed:    Additional Comments:  Lawerance SabalDebbie Jaslyne Beeck, RN 03/04/2016, 4:45 PM

## 2016-03-04 NOTE — Progress Notes (Signed)
PROGRESS NOTE    CORRIGAN KRETSCHMER  WUJ:[id number] DOB: 06-Jan-1956 DOA: 03/02/2016 PCP: No PCP Per Patient   Brief Narrative: 61 y.o. male with medical history significant of HTN, HLD, DM2, CAD, claudication who presents for chest pain.  Mr. Slatter was admitted in November of 2017 for an NSTEMI and was treated with medical management.    ED, the patient was found to be tachycardic and febrile to 104.  He had a normal CXR, US showed many bacteria  And leukocytes.   Assessment & Plan:   Active Problems  Sepsis (HCC) - Monitor culture results - Continue antibiotic regimen.  Bacteremia - d/c pharmacist. Increased Rocephin dose. Patient will require 14 days of antibiotic therapy. Awaiting urine culture results. Plan will be to narrow antibiotic coverage one patient is afebrile.    Type 2 diabetes mellitus without complication, without long-term current use of insulin (HCC) - managed with diet.  Chest pain - Allergy on last check was not recommending further invasive or none invasive testing    Coronary atherosclerosis, s/p CABG in 2009  - according to HPI patient last had stent placed in March of 2017. He has gone one month off imdur and plavix reportedly.    HTN (hypertension) -  Pt on imdur  DVT prophylaxis: on Heparin Code Status: Full Family Communication: none at bedside Disposition Plan: pending improvement in condition   Consultants:   Consult Cardiology   Procedures: none   Antimicrobials: rocephin   Subjective: Pt reports feeling better currently  Objective: Vitals:   03/03/16 2054 03/04/16 0321 03/04/16 0327 03/04/16 1457  BP: 133/66 (!) 166/93 (!) 141/63 (!) 151/65  Pulse: 100 (!) 114 (!) 114 100  Resp: 18 17  18   Temp: (!) 100.5 F (38.1 C) 98.9 F (37.2 C)  99.9 F (37.7 C)  TempSrc: Oral Oral  Oral  SpO2: 99% 97% 95% 98%  Weight:      Height:        Intake/Output Summary (Last 24 hours) at 03/04/16 1630 Last data filed at 03/04/16  1231  Gross per 24 hour  Intake                0 ml  Output              825 ml  Net             -825 ml   Filed Weights   03/02/16 1520 03/02/16 2112  Weight: 96.2 kg (212 lb) 96.2 kg (212 lb)    Examination:  General exam: Appears calm and comfortable, in nad. Respiratory system: Clear to auscultation. Respiratory effort normal. Cardiovascular system: S1 & S2 heard, RRR. No rubs Gastrointestinal system: Abdomen is nondistended, soft and nontender. No organomegaly or masses felt. Normal bowel sounds heard. Central nervous system: Alert and oriented. No focal neurological deficits. Extremities: Symmetric 5 x 5 power. Skin: No rashes, lesions or ulcers, on limited exam. Psychiatry: Judgement and insight appear normal. Mood & affect appropriate.   Data Reviewed: I have personally reviewed following labs and imaging studies  CBC:  Recent Labs Lab 03/02/16 1531 03/02/16 2211 03/04/16 1100  WBC 5.5 14.7* 16.4*  NEUTROABS 4.7  --   --   HGB 14.1 12.2* 10.4*  HCT 42.4 36.3* 31.1*  MCV 85.0 83.8 83.8  PLT 157 128* 96*   Basic Metabolic Panel:  Recent Labs Lab 03/02/16 1531 03/02/16 2211 03/04/16 1100  NA 137  --  134*  K 4.9  --  3.1*  CL 104  --  106  CO2 23  --  18*  GLUCOSE 126*  --  120*  BUN 24*  --  27*  CREATININE 1.44* 1.90* 1.65*  CALCIUM 8.8*  --  7.2*   GFR: Estimated Creatinine Clearance: 58.9 mL/min (by C-G formula based on SCr of 1.65 mg/dL (H)). Liver Function Tests:  Recent Labs Lab 03/02/16 2211  AST 26  ALT 13*  ALKPHOS 60  BILITOT 1.2  PROT 5.9*  ALBUMIN 2.6*   No results for input(s): LIPASE, AMYLASE in the last 168 hours. No results for input(s): AMMONIA in the last 168 hours. Coagulation Profile:  Recent Labs Lab 03/02/16 2211  INR 1.28   Cardiac Enzymes:  Recent Labs Lab 03/02/16 2211 03/03/16 0235 03/03/16 0820  TROPONINI 0.46* 0.70* 1.12*   BNP (last 3 results) No results for input(s): PROBNP in the last 8760  hours. HbA1C:  Recent Labs  03/02/16 2212  HGBA1C 5.2   CBG:  Recent Labs Lab 03/04/16 0007 03/04/16 0425 03/04/16 0807 03/04/16 1230 03/04/16 1613  GLUCAP 97 91 82 110* 142*   Lipid Profile:  Recent Labs  03/03/16 0235  CHOL 138  HDL 28*  LDLCALC 100*  TRIG 48  CHOLHDL 4.9   Thyroid Function Tests: No results for input(s): TSH, T4TOTAL, FREET4, T3FREE, THYROIDAB in the last 72 hours. Anemia Panel: No results for input(s): VITAMINB12, FOLATE, FERRITIN, TIBC, IRON, RETICCTPCT in the last 72 hours. Sepsis Labs:  Recent Labs Lab 03/02/16 1738 03/02/16 2020 03/02/16 2211 03/03/16 0235  LATICACIDVEN 3.23* 2.33* 2.7* 2.4*    Recent Results (from the past 240 hour(s))  Culture, blood (routine x 2)     Status: None (Preliminary result)   Collection Time: 03/02/16  5:23 PM  Result Value Ref Range Status   Specimen Description BLOOD RIGHT ANTECUBITAL  Final   Special Requests BOTTLES DRAWN AEROBIC AND ANAEROBIC 4CC  Final   Culture  Setup Time   Final    IN BOTH AEROBIC AND ANAEROBIC BOTTLES GRAM NEGATIVE RODS CRITICAL VALUE NOTED.  VALUE IS CONSISTENT WITH PREVIOUSLY REPORTED AND CALLED VALUE.    Culture GRAM NEGATIVE RODS  Final   Report Status PENDING  Incomplete  Urine culture     Status: Abnormal (Preliminary result)   Collection Time: 03/02/16  5:24 PM  Result Value Ref Range Status   Specimen Description URINE, CLEAN CATCH  Final   Special Requests NONE  Final   Culture (A)  Final    >=100,000 COLONIES/mL ESCHERICHIA COLI SUSCEPTIBILITIES TO FOLLOW    Report Status PENDING  Incomplete  Culture, blood (routine x 2)     Status: Abnormal (Preliminary result)   Collection Time: 03/02/16  5:30 PM  Result Value Ref Range Status   Specimen Description BLOOD LEFT HAND  Final   Special Requests BOTTLES DRAWN AEROBIC AND ANAEROBIC 5CC  Final   Culture  Setup Time   Final    IN BOTH AEROBIC AND ANAEROBIC BOTTLES GRAM NEGATIVE RODS CRITICAL RESULT CALLED  TO, READ BACK BY AND VERIFIED WITH: J ARMINGER,PHARMD AT 1610 03/03/16 BY L BENFIELD    Culture ESCHERICHIA COLI SUSCEPTIBILITIES TO FOLLOW  (A)  Final   Report Status PENDING  Incomplete  Blood Culture ID Panel (Reflexed)     Status: Abnormal   Collection Time: 03/02/16  5:30 PM  Result Value Ref Range Status   Enterococcus species NOT DETECTED NOT DETECTED Final   Listeria monocytogenes NOT DETECTED NOT DETECTED Final  Staphylococcus species NOT DETECTED NOT DETECTED Final   Staphylococcus aureus NOT DETECTED NOT DETECTED Final   Streptococcus species NOT DETECTED NOT DETECTED Final   Streptococcus agalactiae NOT DETECTED NOT DETECTED Final   Streptococcus pneumoniae NOT DETECTED NOT DETECTED Final   Streptococcus pyogenes NOT DETECTED NOT DETECTED Final   Acinetobacter baumannii NOT DETECTED NOT DETECTED Final   Enterobacteriaceae species DETECTED (A) NOT DETECTED Final    Comment: Enterobacteriaceae represent a large family of gram-negative bacteria, not a single organism. CRITICAL RESULT CALLED TO, READ BACK BY AND VERIFIED WITH: J ARMINGER,PHARMD AT 0716 03/03/16 BY L BENFIELD    Enterobacter cloacae complex NOT DETECTED NOT DETECTED Final   Escherichia coli DETECTED (A) NOT DETECTED Final    Comment: CRITICAL RESULT CALLED TO, READ BACK BY AND VERIFIED WITH: J ARMINGER,PHARMD AT 0716 03/03/16 BY L BENFIELD    Klebsiella oxytoca NOT DETECTED NOT DETECTED Final   Klebsiella pneumoniae NOT DETECTED NOT DETECTED Final   Proteus species NOT DETECTED NOT DETECTED Final   Serratia marcescens NOT DETECTED NOT DETECTED Final   Carbapenem resistance NOT DETECTED NOT DETECTED Final   Haemophilus influenzae NOT DETECTED NOT DETECTED Final   Neisseria meningitidis NOT DETECTED NOT DETECTED Final   Pseudomonas aeruginosa NOT DETECTED NOT DETECTED Final   Candida albicans NOT DETECTED NOT DETECTED Final   Candida glabrata NOT DETECTED NOT DETECTED Final   Candida krusei NOT DETECTED NOT  DETECTED Final   Candida parapsilosis NOT DETECTED NOT DETECTED Final   Candida tropicalis NOT DETECTED NOT DETECTED Final    Radiology Studies: No results found. Scheduled Meds: . aspirin EC  81 mg Oral Daily  . atorvastatin  80 mg Oral Daily  . cefTRIAXone (ROCEPHIN)  IV  2 g Intravenous Q24H  . clopidogrel  75 mg Oral Daily  . insulin aspart  0-9 Units Subcutaneous Q4H  . isosorbide mononitrate  30 mg Oral Daily  . nitroGLYCERIN      . sodium chloride flush  3 mL Intravenous Q12H   Continuous Infusions: . sodium chloride 1,000 mL (03/04/16 0355)  . heparin 1,450 Units/hr (03/04/16 1331)     LOS: 2 days    Time spent: > 35 minutes  Penny PiaVEGA, Yarden Manuelito, MD [facility] Pager [phone]  If 7PM-7AM, please contact night-coverage [email or web address] Password [email or web address] 03/04/2016, 4:30 PM

## 2016-03-04 NOTE — Progress Notes (Signed)
ANTICOAGULATION CONSULT NOTE  Pharmacy Consult for Heparin  Indication: chest pain/ACS  No Known Allergies  Patient Measurements: Height: 6\' 1"  (185.4 cm) Weight: 212 lb (96.2 kg) IBW/kg (Calculated) : 79.9 Heparin dosing weight 96.2 kg  Vital Signs: Temp: 98.9 F (37.2 C) (02/05 0321) Temp Source: Oral (02/05 0321) BP: 141/63 (02/05 0327) Pulse Rate: 114 (02/05 0327)  Labs:  Recent Labs  03/02/16 1531 03/02/16 2211 03/03/16 0235 03/03/16 0820 03/03/16 1419 03/04/16 0931 03/04/16 1100  HGB 14.1 12.2*  --   --   --   --  10.4*  HCT 42.4 36.3*  --   --   --   --  31.1*  PLT 157 128*  --   --   --   --  96*  APTT  --  40*  --   --   --   --   --   LABPROT  --  16.0*  --   --   --   --   --   INR  --  1.28  --   --   --   --   --   HEPARINUNFRC  --   --   --  0.67 0.56 0.14*  --   CREATININE 1.44* 1.90*  --   --   --   --  1.65*  TROPONINI  --  0.46* 0.70* 1.12*  --   --   --     Estimated Creatinine Clearance: 58.9 mL/min (by C-G formula based on SCr of 1.65 mg/dL (H)).   Medical History: Past Medical History:  Diagnosis Date  . Claudication (HCC)   . Coronary artery disease    a. 08/2007 s/p DES to RCA/RI;  b. 07/2006 s/p DES to p/d LCX;  c. 11/2007 CABGx5 (LIMA->LAD, VG->D1, LRA->OM1, VG->PDA->LPL);  d. 09/2012 STEMI/Cath: 3VD, LIMA->LAD nl (LAD 95d), RA->OM nl, VG->PDA->LPL 100, unable to locate VG->Diag, EF 60-65%->Med Rx. e. 03/2015: Synergy DES to mid-Cx in the setting of NSTEMI; f. 11/2015 NSTEMI->Med Rx.  . Diabetes mellitus without complication (HCC)    a. A1c 7.0 09/2012.  . Diastolic dysfunction    a. 02/2016 Echo: EF 50-55%, gr1DD, Ao sclerosis, dil Ao root (42mm), sev dil LA, triv TR, PASP 22mmHg.  Marland Kitchen. Hyperlipidemia   . Hypertension   . Noncompliance     Assessment: 61 y/o M with CP on heparin, hx stent placement and CABG. Heparin level is now subtherapeutic at 0.14 after having 2 x therapeutic levels. Confirmed with RN that the heparin is running fine  and has not been turned off. No bleeding noted.   Goal of Therapy:  Heparin level 0.3-0.7 units/ml Monitor platelets by anticoagulation protocol: Yes   Plan:  Increase heparin to 1450 units/hr Give heparin bolus 2000 units Check anti-Xa level in 6 hours and daily while on heparin Continue to monitor H&H and platelets   Thank you for allowing us to participate in this patients care.  Signe Coltonya C Marayah Higdon, PharmD Clinical phone for 03/04/2016 from 7a-3:30p: [phone] If after 3:30p, please call main pharmacy at: [phone] 03/04/2016 12:36 PM

## 2016-03-04 NOTE — Progress Notes (Signed)
ANTICOAGULATION CONSULT NOTE  Pharmacy Consult for Heparin  Indication: chest pain/ACS  No Known Allergies  Patient Measurements: Height: 6\' 1"  (185.4 cm) Weight: 212 lb (96.2 kg) IBW/kg (Calculated) : 79.9 Heparin dosing weight 96.2 kg  Vital Signs: Temp: 99.9 F (37.7 C) (02/05 1457) Temp Source: Oral (02/05 1457) BP: 151/65 (02/05 1457) Pulse Rate: 100 (02/05 1457)  Labs:  Recent Labs  03/02/16 1531 03/02/16 2211 03/03/16 0235  03/03/16 0820 03/03/16 1419 03/04/16 0931 03/04/16 1100 03/04/16 1819  HGB 14.1 12.2*  --   --   --   --   --  10.4*  --   HCT 42.4 36.3*  --   --   --   --   --  31.1*  --   PLT 157 128*  --   --   --   --   --  96*  --   APTT  --  40*  --   --   --   --   --   --   --   LABPROT  --  16.0*  --   --   --   --   --   --   --   INR  --  1.28  --   --   --   --   --   --   --   HEPARINUNFRC  --   --   --   < > 0.67 0.56 0.14*  --  0.14*  CREATININE 1.44* 1.90*  --   --   --   --   --  1.65*  --   TROPONINI  --  0.46* 0.70*  --  1.12*  --   --   --   --   < > = values in this interval not displayed.  Estimated Creatinine Clearance: 58.9 mL/min (by C-G formula based on SCr of 1.65 mg/dL (H)).   Medical History: Past Medical History:  Diagnosis Date  . Claudication (HCC)   . Coronary artery disease    a. 08/2007 s/p DES to RCA/RI;  b. 07/2006 s/p DES to p/d LCX;  c. 11/2007 CABGx5 (LIMA->LAD, VG->D1, LRA->OM1, VG->PDA->LPL);  d. 09/2012 STEMI/Cath: 3VD, LIMA->LAD nl (LAD 95d), RA->OM nl, VG->PDA->LPL 100, unable to locate VG->Diag, EF 60-65%->Med Rx. e. 03/2015: Synergy DES to mid-Cx in the setting of NSTEMI; f. 11/2015 NSTEMI->Med Rx.  . Diabetes mellitus without complication (HCC)    a. A1c 7.0 09/2012.  . Diastolic dysfunction    a. 02/2016 Echo: EF 50-55%, gr1DD, Ao sclerosis, dil Ao root (42mm), sev dil LA, triv TR, PASP 22mmHg.  Marland Kitchen. Hyperlipidemia   . Hypertension   . Noncompliance     Assessment: 61 y/o M with CP on heparin, hx stent  placement and CABG.  Heparin level is still subtherapeutic at 0.14 after rate adjustment this morning. No bleeding noted.   Goal of Therapy:  Heparin level 0.3-0.7 units/ml Monitor platelets by anticoagulation protocol: Yes   Plan:  Increase heparin to 1750 units/hr Repeat heparin bolus 2000 units Recheck anti-Xa level in 6 hours and daily while on heparin Continue to monitor H&H and platelets   Thank you for allowing us to participate in this patients care.  Sheppard CoilFrank Ladonne Sharples PharmD., BCPS Clinical Pharmacist Pager [phone] 03/04/2016 7:52 PM

## 2016-03-05 LAB — GLUCOSE, CAPILLARY
GLUCOSE-CAPILLARY: 164 mg/dL — AB (ref 65–99)
GLUCOSE-CAPILLARY: 86 mg/dL (ref 65–99)
GLUCOSE-CAPILLARY: 90 mg/dL (ref 65–99)
Glucose-Capillary: 108 mg/dL — ABNORMAL HIGH (ref 65–99)
Glucose-Capillary: 113 mg/dL — ABNORMAL HIGH (ref 65–99)
Glucose-Capillary: 129 mg/dL — ABNORMAL HIGH (ref 65–99)
Glucose-Capillary: 99 mg/dL (ref 65–99)

## 2016-03-05 LAB — BASIC METABOLIC PANEL
ANION GAP: 11 (ref 5–15)
BUN: 21 mg/dL — ABNORMAL HIGH (ref 6–20)
CALCIUM: 7.8 mg/dL — AB (ref 8.9–10.3)
CO2: 17 mmol/L — AB (ref 22–32)
CREATININE: 1.44 mg/dL — AB (ref 0.61–1.24)
Chloride: 106 mmol/L (ref 101–111)
GFR, EST AFRICAN AMERICAN: 60 mL/min — AB (ref >60)
GFR, EST NON AFRICAN AMERICAN: 52 mL/min — AB (ref >60)
Glucose, Bld: 100 mg/dL — ABNORMAL HIGH (ref 65–99)
Potassium: 3.5 mmol/L (ref 3.5–5.1)
SODIUM: 134 mmol/L — AB (ref 135–145)

## 2016-03-05 LAB — CULTURE, BLOOD (ROUTINE X 2)

## 2016-03-05 LAB — HEPARIN LEVEL (UNFRACTIONATED)
HEPARIN UNFRACTIONATED: 0.29 [IU]/mL — AB (ref 0.30–0.70)
HEPARIN UNFRACTIONATED: 0.18 [IU]/mL — AB (ref 0.30–0.70)
HEPARIN UNFRACTIONATED: 0.18 [IU]/mL — AB (ref 0.30–0.70)

## 2016-03-05 LAB — CBC
HCT: 32.5 % — ABNORMAL LOW (ref 39.0–52.0)
Hemoglobin: 10.6 g/dL — ABNORMAL LOW (ref 13.0–17.0)
MCH: 27.3 pg (ref 26.0–34.0)
MCHC: 32.6 g/dL (ref 30.0–36.0)
MCV: 83.8 fL (ref 78.0–100.0)
PLATELETS: 99 10*3/uL — AB (ref 150–400)
RBC: 3.88 MIL/uL — AB (ref 4.22–5.81)
RDW: 15.8 % — ABNORMAL HIGH (ref 11.5–15.5)
WBC: 13.8 10*3/uL — ABNORMAL HIGH (ref 4.0–10.5)

## 2016-03-05 LAB — URINE CULTURE

## 2016-03-05 MED ORDER — NITROGLYCERIN IN D5W 200-5 MCG/ML-% IV SOLN
0.0000 ug/min | INTRAVENOUS | Status: DC
  Administered 2016-03-05: 10 ug/min via INTRAVENOUS
  Filled 2016-03-05: qty 250

## 2016-03-05 MED ORDER — MORPHINE SULFATE (PF) 2 MG/ML IV SOLN
2.0000 mg | INTRAVENOUS | Status: DC | PRN
  Administered 2016-03-05: 2 mg via INTRAVENOUS
  Filled 2016-03-05: qty 1

## 2016-03-05 MED ORDER — GI COCKTAIL ~~LOC~~
30.0000 mL | Freq: Once | ORAL | Status: AC
  Administered 2016-03-05: 30 mL via ORAL
  Filled 2016-03-05: qty 30

## 2016-03-05 MED ORDER — LABETALOL HCL 5 MG/ML IV SOLN
10.0000 mg | Freq: Once | INTRAVENOUS | Status: AC
  Administered 2016-03-05: 10 mg via INTRAVENOUS
  Filled 2016-03-05: qty 4

## 2016-03-05 MED ORDER — CARVEDILOL 25 MG PO TABS
25.0000 mg | ORAL_TABLET | Freq: Two times a day (BID) | ORAL | Status: DC
  Administered 2016-03-05 – 2016-03-08 (×7): 25 mg via ORAL
  Filled 2016-03-05 (×7): qty 1

## 2016-03-05 MED ORDER — HYDRALAZINE HCL 20 MG/ML IJ SOLN
10.0000 mg | Freq: Once | INTRAMUSCULAR | Status: AC
  Administered 2016-03-05: 10 mg via INTRAVENOUS
  Filled 2016-03-05: qty 1

## 2016-03-05 NOTE — Progress Notes (Signed)
ANTICOAGULATION CONSULT NOTE - Follow Up Consult  Pharmacy Consult for heparin Indication: chest pain/ACS  No Known Allergies  Patient Measurements: Height: 6\' 1"  (185.4 cm) Weight: 215 lb 12.8 oz (97.9 kg) IBW/kg (Calculated) : 79.9   Vital Signs: Temp: 97.8 F (36.6 C) (02/06 1300) Temp Source: Oral (02/06 1300) BP: 139/72 (02/06 1300) Pulse Rate: 82 (02/06 1300)  Labs:  Recent Labs  03/02/16 2211 03/03/16 0235 03/03/16 0820  03/04/16 0931 03/04/16 1100 03/04/16 1819 03/05/16 0202  HGB 12.2*  --   --   --   --  10.4*  --  10.6*  HCT 36.3*  --   --   --   --  31.1*  --  32.5*  PLT 128*  --   --   --   --  96*  --  99*  APTT 40*  --   --   --   --   --   --   --   LABPROT 16.0*  --   --   --   --   --   --   --   INR 1.28  --   --   --   --   --   --   --   HEPARINUNFRC  --   --  0.67  < > 0.14*  --  0.14* 0.29*  CREATININE 1.90*  --   --   --   --  1.65*  --  1.44*  TROPONINI 0.46* 0.70* 1.12*  --   --   --   --   --   < > = values in this interval not displayed.  Estimated Creatinine Clearance: 68 mL/min (by C-G formula based on SCr of 1.44 mg/dL (H)).   Medications:  Scheduled:  . aspirin EC  81 mg Oral Daily  . atorvastatin  80 mg Oral Daily  . carvedilol  25 mg Oral BID WC  . cefTRIAXone (ROCEPHIN)  IV  2 g Intravenous Q24H  . clopidogrel  75 mg Oral Daily  . insulin aspart  0-9 Units Subcutaneous Q4H  . isosorbide mononitrate  30 mg Oral Daily  . sodium chloride flush  3 mL Intravenous Q12H    Assessment: 61yo male with ACS.  Heparin adjusted early this AM and remains just shy of goal.  Per d/w RN, no issues with IV nor bleeding.      Goal of Therapy:  Heparin level 0.3-0.7 units/ml Monitor platelets by anticoagulation protocol: Yes   Plan:  Increase heparin to 1950 units/hr Repeat heparin level in 6hr Watch for s/s of bleeding  Marisue HumbleKendra Jalynn Betzold, PharmD Clinical Pharmacist [facility]

## 2016-03-05 NOTE — Progress Notes (Signed)
ANTICOAGULATION CONSULT NOTE - Follow Up Consult  Pharmacy Consult for heparin Indication: chest pain/ACS  No Known Allergies  Patient Measurements: Height: 6\' 1"  (185.4 cm) Weight: 215 lb 12.8 oz (97.9 kg) IBW/kg (Calculated) : 79.9   Vital Signs: Temp: 99.9 F (37.7 C) (02/06 1959) Temp Source: Oral (02/06 1959) BP: 139/72 (02/06 1300) Pulse Rate: 82 (02/06 1300)  Labs:  Recent Labs  03/02/16 2211 03/03/16 0235 03/03/16 0820  03/04/16 1100  03/05/16 0202 03/05/16 1407 03/05/16 2110  HGB 12.2*  --   --   --  10.4*  --  10.6*  --   --   HCT 36.3*  --   --   --  31.1*  --  32.5*  --   --   PLT 128*  --   --   --  96*  --  99*  --   --   APTT 40*  --   --   --   --   --   --   --   --   LABPROT 16.0*  --   --   --   --   --   --   --   --   INR 1.28  --   --   --   --   --   --   --   --   HEPARINUNFRC  --   --  0.67  < >  --   < > 0.29* 0.18* 0.18*  CREATININE 1.90*  --   --   --  1.65*  --  1.44*  --   --   TROPONINI 0.46* 0.70* 1.12*  --   --   --   --   --   --   < > = values in this interval not displayed.  Estimated Creatinine Clearance: 68 mL/min (by C-G formula based on SCr of 1.44 mg/dL (H)).   Medications:  Scheduled:  . aspirin EC  81 mg Oral Daily  . atorvastatin  80 mg Oral Daily  . carvedilol  25 mg Oral BID WC  . cefTRIAXone (ROCEPHIN)  IV  2 g Intravenous Q24H  . clopidogrel  75 mg Oral Daily  . insulin aspart  0-9 Units Subcutaneous Q4H  . isosorbide mononitrate  30 mg Oral Daily  . sodium chloride flush  3 mL Intravenous Q12H    Assessment: 61yo male with ACS.   Heparin level this evening is unchanged and still below goal at 0.18 on 1950 units/hr. No bleeding issues noted. Increase rate and recheck level in the morning.     Goal of Therapy:  Heparin level 0.3-0.7 units/ml Monitor platelets by anticoagulation protocol: Yes   Plan:  Increase heparin to 2150 units/hr Repeat heparin level with am labs Watch for s/s of bleeding  [name] PharmD., BCPS Clinical Pharmacist Pager [phone] 03/05/2016 9:49 PM

## 2016-03-05 NOTE — Progress Notes (Signed)
Pt complained of chest pains x2 to left chest area. This last time was to his epigastric region. Pt received nitro. 0.4 mg x3 and narco. B/P elevated hydralazine given x2 with no results from first dose.  Will recheck b/p after this second dose already given as well. Pt. Pain appears not to be easing off with this last dose of nitro. DR Craige CottaKirby aware orders received will cont. To monitor.

## 2016-03-05 NOTE — Progress Notes (Signed)
ANTICOAGULATION CONSULT NOTE   Pharmacy Consult for Heparin  Indication: chest pain/ACS  No Known Allergies  Patient Measurements: Height: 6\' 1"  (185.4 cm) Weight: 212 lb (96.2 kg) IBW/kg (Calculated) : 79.9  Vital Signs: Temp: 98.3 F (36.8 C) (02/06 0159) Temp Source: Oral (02/06 0159) BP: 186/100 (02/06 0159) Pulse Rate: 102 (02/06 0126)  Labs:  Recent Labs  03/02/16 2211 03/03/16 0235 03/03/16 0820  03/04/16 0931 03/04/16 1100 03/04/16 1819 03/05/16 0202  HGB 12.2*  --   --   --   --  10.4*  --  10.6*  HCT 36.3*  --   --   --   --  31.1*  --  32.5*  PLT 128*  --   --   --   --  96*  --  99*  APTT 40*  --   --   --   --   --   --   --   LABPROT 16.0*  --   --   --   --   --   --   --   INR 1.28  --   --   --   --   --   --   --   HEPARINUNFRC  --   --  0.67  < > 0.14*  --  0.14* 0.29*  CREATININE 1.90*  --   --   --   --  1.65*  --  1.44*  TROPONINI 0.46* 0.70* 1.12*  --   --   --   --   --   < > = values in this interval not displayed.  Estimated Creatinine Clearance: 67.5 mL/min (by C-G formula based on SCr of 1.44 mg/dL (H)).   Medical History: Past Medical History:  Diagnosis Date  . Claudication (HCC)   . Coronary artery disease    a. 08/2007 s/p DES to RCA/RI;  b. 07/2006 s/p DES to p/d LCX;  c. 11/2007 CABGx5 (LIMA->LAD, VG->D1, LRA->OM1, VG->PDA->LPL);  d. 09/2012 STEMI/Cath: 3VD, LIMA->LAD nl (LAD 95d), RA->OM nl, VG->PDA->LPL 100, unable to locate VG->Diag, EF 60-65%->Med Rx. e. 03/2015: Synergy DES to mid-Cx in the setting of NSTEMI; f. 11/2015 NSTEMI->Med Rx.  . Diabetes mellitus without complication (HCC)    a. A1c 7.0 09/2012.  . Diastolic dysfunction    a. 02/2016 Echo: EF 50-55%, gr1DD, Ao sclerosis, dil Ao root (42mm), sev dil LA, triv TR, PASP 22mmHg.  Marland Kitchen. Hyperlipidemia   . Hypertension   . Noncompliance     Assessment: 61 y/o M with CP on heparin, hx stent placement, hx CABG, cardiology rec's treating medically for now, heparin level just  below therapeutic range this AM  Goal of Therapy:  Heparin level 0.3-0.7 units/ml Monitor platelets by anticoagulation protocol: Yes   Plan:  -Inc heparin to 1850 units/hr -1100 HL  Xandria Gallaga 03/05/2016,2:48 AM

## 2016-03-05 NOTE — Progress Notes (Signed)
RN had paged earlier secondary to pt c/o CP 4/10 after given NTG SL. BP also high. Ordered Hydralazine and had to r/p dose for continued high BP. RN called back stating after 3 NTG (not every 5 mins), still c/o CP from 4/10-7/10. No other associated sx except some epigastic pain and back pain (which he had when he came to hospital and also has a UTI). Describes CP as sharp, mostly left chest.  Ordered Labetalol 10mg  now, GI cocktail, MSO4, and NTG drip with transfer to another unit. EKG not acutely changed from previous.  NP reviewed chart. UTI, sepsis, chest pain and HTN urgency are dx. Pt has hx of non compliance with meds. He has an extensive CAD hx with CABG, caths, and last admission in Nov 2017 with CP and elevated troponin. Medical mngt was decision by cardiology in November and in consult during this admission. Pt is on Plavix.  Called cardio on call, [place], who agreed with plan. KJKG, NP [facility]

## 2016-03-05 NOTE — Progress Notes (Signed)
PROGRESS NOTE    Darrell [name]  JWJ:[id number] DOB: Jun 18, 1955 DOA: 03/02/2016 PCP: No PCP Per Patient   Brief Narrative: 61 y.o. male with medical history significant of HTN, HLD, DM2, CAD, claudication who presents for chest pain.  Darrell [name] was admitted in November of 2017 for an NSTEMI and was treated with medical management.    ED, the patient was found to be tachycardic and febrile to 104.  He had a normal CXR, US showed many bacteria  And leukocytes.   Assessment & Plan:   Active Problems  Sepsis (HCC) - Monitor culture results - Continue antibiotic regimen.  Bacteremia - d/c pharmacist. Increased Rocephin dose. Patient will require 14 days of antibiotic therapy. Awaiting final urine culture results with sensitivities. Plan will be to narrow antibiotic coverage one patient is afebrile and sensitivities resulted.    Type 2 diabetes mellitus without complication, without long-term current use of insulin (HCC) - managed with diet.  Chest pain - Allergy on last check was not recommending further invasive or none invasive testing    Coronary atherosclerosis, s/p CABG in 2009  - according to HPI patient last had stent placed in March of 2017. He has gone one month off imdur and plavix reportedly.    HTN (hypertension) -  Pt on imdur  DVT prophylaxis: on Heparin Code Status: Full Family Communication: none at bedside Disposition Plan: Most likely d/c in the next 1-2 days.   Consultants:   Consult Cardiology   Procedures: none   Antimicrobials: rocephin   Subjective: He denies any new complaints. Reportedly had some chest pain overnight.   Objective: Vitals:   03/05/16 0615 03/05/16 0630 03/05/16 0645 03/05/16 1300  BP: (!) 150/86 (!) 147/71 (!) 143/79 139/72  Pulse:    82  Resp:    17  Temp:    97.8 F (36.6 C)  TempSrc:    Oral  SpO2: 96% 97% 97% 98%  Weight:      Height:        Intake/Output Summary (Last 24 hours) at 03/05/16 1532 Last  data filed at 03/05/16 1435  Gross per 24 hour  Intake          4241.99 ml  Output             1900 ml  Net          2341.99 ml   Filed Weights   03/02/16 1520 03/02/16 2112 03/05/16 0355  Weight: 96.2 kg (212 lb) 96.2 kg (212 lb) 97.9 kg (215 lb 12.8 oz)    Examination:  General exam: Appears calm and comfortable, in nad. Respiratory system: Clear to auscultation. Respiratory effort normal. Cardiovascular system: S1 & S2 heard, RRR. No rubs Gastrointestinal system: Abdomen is nondistended, soft and nontender. No organomegaly or masses felt. Normal bowel sounds heard. Central nervous system: Alert and oriented. No focal neurological deficits. Extremities: Symmetric 5 x 5 power. Skin: No rashes, lesions or ulcers, on limited exam. Psychiatry: Judgement and insight appear normal. Mood & affect appropriate.   Data Reviewed: I have personally reviewed following labs and imaging studies  CBC:  Recent Labs Lab 03/02/16 1531 03/02/16 2211 03/04/16 1100 03/05/16 0202  WBC 5.5 14.7* 16.4* 13.8*  NEUTROABS 4.7  --   --   --   HGB 14.1 12.2* 10.4* 10.6*  HCT 42.4 36.3* 31.1* 32.5*  MCV 85.0 83.8 83.8 83.8  PLT 157 128* 96* 99*   Basic Metabolic Panel:  Recent Labs Lab 03/02/16 1531 03/02/16  2211 03/04/16 1100 03/05/16 0202  NA 137  --  134* 134*  K 4.9  --  3.1* 3.5  CL 104  --  106 106  CO2 23  --  18* 17*  GLUCOSE 126*  --  120* 100*  BUN 24*  --  27* 21*  CREATININE 1.44* 1.90* 1.65* 1.44*  CALCIUM 8.8*  --  7.2* 7.8*   GFR: Estimated Creatinine Clearance: 68 mL/min (by C-G formula based on SCr of 1.44 mg/dL (H)). Liver Function Tests:  Recent Labs Lab 03/02/16 2211  AST 26  ALT 13*  ALKPHOS 60  BILITOT 1.2  PROT 5.9*  ALBUMIN 2.6*   No results for input(s): LIPASE, AMYLASE in the last 168 hours. No results for input(s): AMMONIA in the last 168 hours. Coagulation Profile:  Recent Labs Lab 03/02/16 2211  INR 1.28   Cardiac Enzymes:  Recent  Labs Lab 03/02/16 2211 03/03/16 0235 03/03/16 0820  TROPONINI 0.46* 0.70* 1.12*   BNP (last 3 results) No results for input(s): PROBNP in the last 8760 hours. HbA1C:  Recent Labs  03/02/16 2212  HGBA1C 5.2   CBG:  Recent Labs Lab 03/04/16 2000 03/05/16 0032 03/05/16 0330 03/05/16 0731 03/05/16 1157  GLUCAP 153* 99 113* 108* 129*   Lipid Profile:  Recent Labs  03/03/16 0235  CHOL 138  HDL 28*  LDLCALC 100*  TRIG 48  CHOLHDL 4.9   Thyroid Function Tests: No results for input(s): TSH, T4TOTAL, FREET4, T3FREE, THYROIDAB in the last 72 hours. Anemia Panel: No results for input(s): VITAMINB12, FOLATE, FERRITIN, TIBC, IRON, RETICCTPCT in the last 72 hours. Sepsis Labs:  Recent Labs Lab 03/02/16 1738 03/02/16 2020 03/02/16 2211 03/03/16 0235  LATICACIDVEN 3.23* 2.33* 2.7* 2.4*    Recent Results (from the past 240 hour(s))  Culture, blood (routine x 2)     Status: Abnormal   Collection Time: 03/02/16  5:23 PM  Result Value Ref Range Status   Specimen Description BLOOD RIGHT ANTECUBITAL  Final   Special Requests BOTTLES DRAWN AEROBIC AND ANAEROBIC 4CC  Final   Culture  Setup Time   Final    IN BOTH AEROBIC AND ANAEROBIC BOTTLES GRAM NEGATIVE RODS CRITICAL VALUE NOTED.  VALUE IS CONSISTENT WITH PREVIOUSLY REPORTED AND CALLED VALUE.    Culture (A)  Final    ESCHERICHIA COLI SUSCEPTIBILITIES PERFORMED ON PREVIOUS CULTURE WITHIN THE LAST 5 DAYS.    Report Status 03/05/2016 FINAL  Final  Urine culture     Status: Abnormal   Collection Time: 03/02/16  5:24 PM  Result Value Ref Range Status   Specimen Description URINE, CLEAN CATCH  Final   Special Requests NONE  Final   Culture >=100,000 COLONIES/mL ESCHERICHIA COLI (A)  Final   Report Status 03/05/2016 FINAL  Final   Organism ID, Bacteria ESCHERICHIA COLI (A)  Final      Susceptibility   Escherichia coli - MIC*    AMPICILLIN >=32 RESISTANT Resistant     CEFAZOLIN <=4 SENSITIVE Sensitive      CEFTRIAXONE <=1 SENSITIVE Sensitive     CIPROFLOXACIN <=0.25 SENSITIVE Sensitive     GENTAMICIN <=1 SENSITIVE Sensitive     IMIPENEM <=0.25 SENSITIVE Sensitive     NITROFURANTOIN <=16 SENSITIVE Sensitive     TRIMETH/SULFA <=20 SENSITIVE Sensitive     AMPICILLIN/SULBACTAM >=32 RESISTANT Resistant     PIP/TAZO <=4 SENSITIVE Sensitive     Extended ESBL NEGATIVE Sensitive     * >=100,000 COLONIES/mL ESCHERICHIA COLI  Culture, blood (routine x  2)     Status: Abnormal   Collection Time: 03/02/16  5:30 PM  Result Value Ref Range Status   Specimen Description BLOOD LEFT HAND  Final   Special Requests BOTTLES DRAWN AEROBIC AND ANAEROBIC 5CC  Final   Culture  Setup Time   Final    IN BOTH AEROBIC AND ANAEROBIC BOTTLES GRAM NEGATIVE RODS CRITICAL RESULT CALLED TO, READ BACK BY AND VERIFIED WITH: J ARMINGER,PHARMD AT 0716 03/03/16 BY L BENFIELD    Culture ESCHERICHIA COLI (A)  Final   Report Status 03/05/2016 FINAL  Final   Organism ID, Bacteria ESCHERICHIA COLI  Final      Susceptibility   Escherichia coli - MIC*    AMPICILLIN >=32 RESISTANT Resistant     CEFAZOLIN <=4 SENSITIVE Sensitive     CEFEPIME <=1 SENSITIVE Sensitive     CEFTAZIDIME <=1 SENSITIVE Sensitive     CEFTRIAXONE <=1 SENSITIVE Sensitive     CIPROFLOXACIN <=0.25 SENSITIVE Sensitive     GENTAMICIN <=1 SENSITIVE Sensitive     IMIPENEM <=0.25 SENSITIVE Sensitive     TRIMETH/SULFA <=20 SENSITIVE Sensitive     AMPICILLIN/SULBACTAM >=32 RESISTANT Resistant     PIP/TAZO <=4 SENSITIVE Sensitive     Extended ESBL NEGATIVE Sensitive     * ESCHERICHIA COLI  Blood Culture ID Panel (Reflexed)     Status: Abnormal   Collection Time: 03/02/16  5:30 PM  Result Value Ref Range Status   Enterococcus species NOT DETECTED NOT DETECTED Final   Listeria monocytogenes NOT DETECTED NOT DETECTED Final   Staphylococcus species NOT DETECTED NOT DETECTED Final   Staphylococcus aureus NOT DETECTED NOT DETECTED Final   Streptococcus species NOT  DETECTED NOT DETECTED Final   Streptococcus agalactiae NOT DETECTED NOT DETECTED Final   Streptococcus pneumoniae NOT DETECTED NOT DETECTED Final   Streptococcus pyogenes NOT DETECTED NOT DETECTED Final   Acinetobacter baumannii NOT DETECTED NOT DETECTED Final   Enterobacteriaceae species DETECTED (A) NOT DETECTED Final    Comment: Enterobacteriaceae represent a large family of gram-negative bacteria, not a single organism. CRITICAL RESULT CALLED TO, READ BACK BY AND VERIFIED WITH: J ARMINGER,PHARMD AT 0716 03/03/16 BY L BENFIELD    Enterobacter cloacae complex NOT DETECTED NOT DETECTED Final   Escherichia coli DETECTED (A) NOT DETECTED Final    Comment: CRITICAL RESULT CALLED TO, READ BACK BY AND VERIFIED WITH: J ARMINGER,PHARMD AT 0716 03/03/16 BY L BENFIELD    Klebsiella oxytoca NOT DETECTED NOT DETECTED Final   Klebsiella pneumoniae NOT DETECTED NOT DETECTED Final   Proteus species NOT DETECTED NOT DETECTED Final   Serratia marcescens NOT DETECTED NOT DETECTED Final   Carbapenem resistance NOT DETECTED NOT DETECTED Final   Haemophilus influenzae NOT DETECTED NOT DETECTED Final   Neisseria meningitidis NOT DETECTED NOT DETECTED Final   Pseudomonas aeruginosa NOT DETECTED NOT DETECTED Final   Candida albicans NOT DETECTED NOT DETECTED Final   Candida glabrata NOT DETECTED NOT DETECTED Final   Candida krusei NOT DETECTED NOT DETECTED Final   Candida parapsilosis NOT DETECTED NOT DETECTED Final   Candida tropicalis NOT DETECTED NOT DETECTED Final    Radiology Studies: No results found. Scheduled Meds: . aspirin EC  81 mg Oral Daily  . atorvastatin  80 mg Oral Daily  . carvedilol  25 mg Oral BID WC  . cefTRIAXone (ROCEPHIN)  IV  2 g Intravenous Q24H  . clopidogrel  75 mg Oral Daily  . insulin aspart  0-9 Units Subcutaneous Q4H  . isosorbide mononitrate  30 mg Oral Daily  . sodium chloride flush  3 mL Intravenous Q12H   Continuous Infusions: . sodium chloride 1,000 mL (03/04/16  2235)  . heparin 1,950 Units/hr (03/05/16 1435)  . nitroGLYCERIN 15 mcg/min (03/05/16 0415)     LOS: 3 days    Time spent: > 35 minutes  Penny Pia, MD [facility] Pager [phone]  If 7PM-7AM, please contact night-coverage [email or web address] Password [facility] 03/05/2016, 3:32 PM

## 2016-03-06 DIAGNOSIS — N179 Acute kidney failure, unspecified: Secondary | ICD-10-CM

## 2016-03-06 DIAGNOSIS — R7881 Bacteremia: Secondary | ICD-10-CM

## 2016-03-06 DIAGNOSIS — A4151 Sepsis due to Escherichia coli [E. coli]: Principal | ICD-10-CM

## 2016-03-06 DIAGNOSIS — E119 Type 2 diabetes mellitus without complications: Secondary | ICD-10-CM

## 2016-03-06 DIAGNOSIS — I160 Hypertensive urgency: Secondary | ICD-10-CM

## 2016-03-06 LAB — GLUCOSE, CAPILLARY
GLUCOSE-CAPILLARY: 125 mg/dL — AB (ref 65–99)
GLUCOSE-CAPILLARY: 107 mg/dL — AB (ref 65–99)
GLUCOSE-CAPILLARY: 156 mg/dL — AB (ref 65–99)
Glucose-Capillary: 108 mg/dL — ABNORMAL HIGH (ref 65–99)
Glucose-Capillary: 87 mg/dL (ref 65–99)
Glucose-Capillary: 99 mg/dL (ref 65–99)

## 2016-03-06 LAB — HEPARIN LEVEL (UNFRACTIONATED): Heparin Unfractionated: 0.32 IU/mL (ref 0.30–0.70)

## 2016-03-06 MED ORDER — SENNOSIDES-DOCUSATE SODIUM 8.6-50 MG PO TABS
2.0000 | ORAL_TABLET | Freq: Two times a day (BID) | ORAL | Status: DC
  Administered 2016-03-07 – 2016-03-08 (×2): 2 via ORAL
  Filled 2016-03-06 (×3): qty 2

## 2016-03-06 MED ORDER — ENOXAPARIN SODIUM 40 MG/0.4ML ~~LOC~~ SOLN
40.0000 mg | SUBCUTANEOUS | Status: DC
  Administered 2016-03-06 – 2016-03-08 (×3): 40 mg via SUBCUTANEOUS
  Filled 2016-03-06 (×3): qty 0.4

## 2016-03-06 NOTE — Progress Notes (Addendum)
PROGRESS NOTE    Darrell [name]  WUJ:[id number] DOB: 20-Sep-1955 DOA: 03/02/2016 PCP: No PCP Per Patient   Brief Narrative: 61 y.o. male with medical history significant of HTN, HLD, DM2, CAD, claudication who presents for chest pain.  Darrell [name] was admitted in November of 2017 for an NSTEMI and was treated with medical management.    ED, the patient was found to be tachycardic and febrile to 104.  He had a normal CXR, US showed many bacteria  And leukocytes.   Assessment & Plan:   Ecoli UTI/Ecoli bacteremia/sepsis presented on admission with fever 104, sinus tachycardia, leukocytosis (wbc 16.7), lactic acidosis  (lactic acid 3.23) -He received vanc/zosyn in the ED, abx changed to rocephin since admission -repeat blood culture pending, consider change to oral abx once repeat blood culture no growth  Thrombocytopenia: mild , likely from sepsis Nadir at 99, montior   Chest pain (chief compliant on presentation)/hypertension urgency ( bp on arrival 212/124), (report out of meds for a months, this has been a recurrent issue , he has similar presentations with hypertensive urgency and chest pain in the past ) H/o Coronary atherosclerosis, s/p CABG in 2009 , h/o pci with stent,  He has gone one month off imdur and plavix  troponin on presentation was 0.46, seems to peak at 1.12, ekg with chronic inferalateral st depression, no acute interval changes, He was started on heparin drip and nitrodrip, his chest pain has resolved the next day with blood pressure control Cardiology consulted on 2/4  "[name], statin, ? blocker, nitrate, [name], and plavix.  Would not pursue ischemic eval @ this point." "Given the absence of symptoms I do not think that further invasive or noninvasive testing is indicated.  He has been out of his meds and is no compliant with follow up in our office.  He needs to resume previous meds and keep follow up appts.   Darrell [name]   3:33 PM  03/03/2016"   HTN urgency: presented on admission with chest pain, bp on arrival 212/124 He was started on nitro drip, blood pressure has improved, chest pain has resolve.  Nitrodrip stopped, continue coreg/imdur,  Monitor blood pressure , continue adjust meds to achieve goal.   AKI on CKD II: aki likely from sepsis and uti Cr peaked at 1.9, trending down Home meds cozaar held since admission  Noinsulin dependent Type 2 diabetes mellitus - a1c 5.2, on ssi here - home meds metformin held since admission, likely able to resume at discharge pending renal function improvement    Medication and medical follow up noncompliance, per chart review, patient has recent hospitalizaion in 11/2015, 12/2015 and 02/2016 for recurrent chest pain. No pmd or cardiology follow up, he report has been out of meds for a month prior to this hospitalization. Case manager consulted.  DVT prophylaxis: he was on Heparin drip, changed to lovenox prophylaxis dose Code Status: Full Family Communication: none at bedside Disposition Plan: d/c home on oral abx once repeat blood culture no growth, monitor cr level   Consultants:   Consult Cardiology   Procedures: none   Antimicrobials: rocephin   Subjective: No chest pain, no fever, blood pressure stable  Objective: Vitals:   03/05/16 2350 03/06/16 0115 03/06/16 0215 03/06/16 0535  BP: (!) 164/94 (!) 150/91 (!) 154/92 (!) 165/90  Pulse:      Resp:      Temp:    98.7 F (37.1 C)  TempSrc:  Oral  SpO2: 96% 94% 93% 98%  Weight:      Height:        Intake/Output Summary (Last 24 hours) at 03/06/16 0814 Last data filed at 03/06/16 0000  Gross per 24 hour  Intake          1625.58 ml  Output             1975 ml  Net          -349.42 ml   Filed Weights   03/02/16 1520 03/02/16 2112 03/05/16 0355  Weight: 96.2 kg (212 lb) 96.2 kg (212 lb) 97.9 kg (215 lb 12.8 oz)    Examination:  General exam: Appears calm and comfortable, in  nad. Respiratory system: Clear to auscultation. Respiratory effort normal. Cardiovascular system: S1 & S2 heard, RRR. No rubs Gastrointestinal system: Abdomen is nondistended, soft and nontender. No organomegaly or masses felt. Normal bowel sounds heard. Central nervous system: Alert and oriented. No focal neurological deficits. Extremities: Symmetric 5 x 5 power. Skin: No rashes, lesions or ulcers, on limited exam. Psychiatry: Judgement and insight appear normal. Mood & affect appropriate.   Data Reviewed: I have personally reviewed following labs and imaging studies  CBC:  Recent Labs Lab 03/02/16 1531 03/02/16 2211 03/04/16 1100 03/05/16 0202  WBC 5.5 14.7* 16.4* 13.8*  NEUTROABS 4.7  --   --   --   HGB 14.1 12.2* 10.4* 10.6*  HCT 42.4 36.3* 31.1* 32.5*  MCV 85.0 83.8 83.8 83.8  PLT 157 128* 96* 99*   Basic Metabolic Panel:  Recent Labs Lab 03/02/16 1531 03/02/16 2211 03/04/16 1100 03/05/16 0202  NA 137  --  134* 134*  K 4.9  --  3.1* 3.5  CL 104  --  106 106  CO2 23  --  18* 17*  GLUCOSE 126*  --  120* 100*  BUN 24*  --  27* 21*  CREATININE 1.44* 1.90* 1.65* 1.44*  CALCIUM 8.8*  --  7.2* 7.8*   GFR: Estimated Creatinine Clearance: 68 mL/min (by C-G formula based on SCr of 1.44 mg/dL (H)). Liver Function Tests:  Recent Labs Lab 03/02/16 2211  AST 26  ALT 13*  ALKPHOS 60  BILITOT 1.2  PROT 5.9*  ALBUMIN 2.6*   No results for input(s): LIPASE, AMYLASE in the last 168 hours. No results for input(s): AMMONIA in the last 168 hours. Coagulation Profile:  Recent Labs Lab 03/02/16 2211  INR 1.28   Cardiac Enzymes:  Recent Labs Lab 03/02/16 2211 03/03/16 0235 03/03/16 0820  TROPONINI 0.46* 0.70* 1.12*   BNP (last 3 results) No results for input(s): PROBNP in the last 8760 hours. HbA1C: No results for input(s): HGBA1C in the last 72 hours. CBG:  Recent Labs Lab 03/05/16 1633 03/05/16 1957 03/05/16 2350 03/06/16 0512 03/06/16 0714   GLUCAP 90 164* 86 99 87   Lipid Profile: No results for input(s): CHOL, HDL, LDLCALC, TRIG, CHOLHDL, LDLDIRECT in the last 72 hours. Thyroid Function Tests: No results for input(s): TSH, T4TOTAL, FREET4, T3FREE, THYROIDAB in the last 72 hours. Anemia Panel: No results for input(s): VITAMINB12, FOLATE, FERRITIN, TIBC, IRON, RETICCTPCT in the last 72 hours. Sepsis Labs:  Recent Labs Lab 03/02/16 1738 03/02/16 2020 03/02/16 2211 03/03/16 0235  LATICACIDVEN 3.23* 2.33* 2.7* 2.4*    Recent Results (from the past 240 hour(s))  Culture, blood (routine x 2)     Status: Abnormal   Collection Time: 03/02/16  5:23 PM  Result Value Ref Range Status  Specimen Description BLOOD RIGHT ANTECUBITAL  Final   Special Requests BOTTLES DRAWN AEROBIC AND ANAEROBIC 4CC  Final   Culture  Setup Time   Final    IN BOTH AEROBIC AND ANAEROBIC BOTTLES GRAM NEGATIVE RODS CRITICAL VALUE NOTED.  VALUE IS CONSISTENT WITH PREVIOUSLY REPORTED AND CALLED VALUE.    Culture (A)  Final    ESCHERICHIA COLI SUSCEPTIBILITIES PERFORMED ON PREVIOUS CULTURE WITHIN THE LAST 5 DAYS.    Report Status 03/05/2016 FINAL  Final  Urine culture     Status: Abnormal   Collection Time: 03/02/16  5:24 PM  Result Value Ref Range Status   Specimen Description URINE, CLEAN CATCH  Final   Special Requests NONE  Final   Culture >=100,000 COLONIES/mL ESCHERICHIA COLI (A)  Final   Report Status 03/05/2016 FINAL  Final   Organism ID, Bacteria ESCHERICHIA COLI (A)  Final      Susceptibility   Escherichia coli - MIC*    AMPICILLIN >=32 RESISTANT Resistant     CEFAZOLIN <=4 SENSITIVE Sensitive     CEFTRIAXONE <=1 SENSITIVE Sensitive     CIPROFLOXACIN <=0.25 SENSITIVE Sensitive     GENTAMICIN <=1 SENSITIVE Sensitive     IMIPENEM <=0.25 SENSITIVE Sensitive     NITROFURANTOIN <=16 SENSITIVE Sensitive     TRIMETH/SULFA <=20 SENSITIVE Sensitive     AMPICILLIN/SULBACTAM >=32 RESISTANT Resistant     PIP/TAZO <=4 SENSITIVE Sensitive      Extended ESBL NEGATIVE Sensitive     * >=100,000 COLONIES/mL ESCHERICHIA COLI  Culture, blood (routine x 2)     Status: Abnormal   Collection Time: 03/02/16  5:30 PM  Result Value Ref Range Status   Specimen Description BLOOD LEFT HAND  Final   Special Requests BOTTLES DRAWN AEROBIC AND ANAEROBIC 5CC  Final   Culture  Setup Time   Final    IN BOTH AEROBIC AND ANAEROBIC BOTTLES GRAM NEGATIVE RODS CRITICAL RESULT CALLED TO, READ BACK BY AND VERIFIED WITH: J ARMINGER,PHARMD AT 0716 03/03/16 BY L BENFIELD    Culture ESCHERICHIA COLI (A)  Final   Report Status 03/05/2016 FINAL  Final   Organism ID, Bacteria ESCHERICHIA COLI  Final      Susceptibility   Escherichia coli - MIC*    AMPICILLIN >=32 RESISTANT Resistant     CEFAZOLIN <=4 SENSITIVE Sensitive     CEFEPIME <=1 SENSITIVE Sensitive     CEFTAZIDIME <=1 SENSITIVE Sensitive     CEFTRIAXONE <=1 SENSITIVE Sensitive     CIPROFLOXACIN <=0.25 SENSITIVE Sensitive     GENTAMICIN <=1 SENSITIVE Sensitive     IMIPENEM <=0.25 SENSITIVE Sensitive     TRIMETH/SULFA <=20 SENSITIVE Sensitive     AMPICILLIN/SULBACTAM >=32 RESISTANT Resistant     PIP/TAZO <=4 SENSITIVE Sensitive     Extended ESBL NEGATIVE Sensitive     * ESCHERICHIA COLI  Blood Culture ID Panel (Reflexed)     Status: Abnormal   Collection Time: 03/02/16  5:30 PM  Result Value Ref Range Status   Enterococcus species NOT DETECTED NOT DETECTED Final   Listeria monocytogenes NOT DETECTED NOT DETECTED Final   Staphylococcus species NOT DETECTED NOT DETECTED Final   Staphylococcus aureus NOT DETECTED NOT DETECTED Final   Streptococcus species NOT DETECTED NOT DETECTED Final   Streptococcus agalactiae NOT DETECTED NOT DETECTED Final   Streptococcus pneumoniae NOT DETECTED NOT DETECTED Final   Streptococcus pyogenes NOT DETECTED NOT DETECTED Final   Acinetobacter baumannii NOT DETECTED NOT DETECTED Final   Enterobacteriaceae species DETECTED (A) NOT  DETECTED Final    Comment:  Enterobacteriaceae represent a large family of gram-negative bacteria, not a single organism. CRITICAL RESULT CALLED TO, READ BACK BY AND VERIFIED WITH: J ARMINGER,PHARMD AT 0716 03/03/16 BY L BENFIELD    Enterobacter cloacae complex NOT DETECTED NOT DETECTED Final   Escherichia coli DETECTED (A) NOT DETECTED Final    Comment: CRITICAL RESULT CALLED TO, READ BACK BY AND VERIFIED WITH: J ARMINGER,PHARMD AT 0716 03/03/16 BY L BENFIELD    Klebsiella oxytoca NOT DETECTED NOT DETECTED Final   Klebsiella pneumoniae NOT DETECTED NOT DETECTED Final   Proteus species NOT DETECTED NOT DETECTED Final   Serratia marcescens NOT DETECTED NOT DETECTED Final   Carbapenem resistance NOT DETECTED NOT DETECTED Final   Haemophilus influenzae NOT DETECTED NOT DETECTED Final   Neisseria meningitidis NOT DETECTED NOT DETECTED Final   Pseudomonas aeruginosa NOT DETECTED NOT DETECTED Final   Candida albicans NOT DETECTED NOT DETECTED Final   Candida glabrata NOT DETECTED NOT DETECTED Final   Candida krusei NOT DETECTED NOT DETECTED Final   Candida parapsilosis NOT DETECTED NOT DETECTED Final   Candida tropicalis NOT DETECTED NOT DETECTED Final    Radiology Studies: No results found. Scheduled Meds: . aspirin EC  81 mg Oral Daily  . atorvastatin  80 mg Oral Daily  . carvedilol  25 mg Oral BID WC  . cefTRIAXone (ROCEPHIN)  IV  2 g Intravenous Q24H  . clopidogrel  75 mg Oral Daily  . insulin aspart  0-9 Units Subcutaneous Q4H  . isosorbide mononitrate  30 mg Oral Daily  . sodium chloride flush  3 mL Intravenous Q12H   Continuous Infusions: . sodium chloride 1,000 mL (03/04/16 2235)  . heparin 2,150 Units/hr (03/06/16 0000)  . nitroGLYCERIN 20 mcg/min (03/06/16 0114)     LOS: 4 days    Time spent: > 35 minutes  Trica Usery, MD PhD [facility] Pager [phone]  If 7PM-7AM, please contact night-coverage [email or web address] Password [email or web address] 03/06/2016, 8:14 AM

## 2016-03-06 NOTE — Care Management Important Message (Signed)
Important Message  Patient Details  Name: Darrell [name] MRN: [id number] Date of Birth: 07/26/1955   Medicare Important Message Given:  Yes    Webster Patrone Abena 03/06/2016, 11:40 AM

## 2016-03-07 DIAGNOSIS — I10 Essential (primary) hypertension: Secondary | ICD-10-CM

## 2016-03-07 DIAGNOSIS — B9620 Unspecified Escherichia coli [E. coli] as the cause of diseases classified elsewhere: Secondary | ICD-10-CM

## 2016-03-07 DIAGNOSIS — N390 Urinary tract infection, site not specified: Secondary | ICD-10-CM

## 2016-03-07 LAB — CBC WITH DIFFERENTIAL/PLATELET
BASOS PCT: 0 %
Basophils Absolute: 0 10*3/uL (ref 0.0–0.1)
Eosinophils Absolute: 0.1 10*3/uL (ref 0.0–0.7)
Eosinophils Relative: 1 %
HEMATOCRIT: 31.8 % — AB (ref 39.0–52.0)
HEMOGLOBIN: 10.3 g/dL — AB (ref 13.0–17.0)
LYMPHS ABS: 1.1 10*3/uL (ref 0.7–4.0)
LYMPHS PCT: 25 %
MCH: 27.1 pg (ref 26.0–34.0)
MCHC: 32.4 g/dL (ref 30.0–36.0)
MCV: 83.7 fL (ref 78.0–100.0)
MONO ABS: 0.6 10*3/uL (ref 0.1–1.0)
MONOS PCT: 14 %
NEUTROS ABS: 2.7 10*3/uL (ref 1.7–7.7)
NEUTROS PCT: 60 %
Platelets: 106 10*3/uL — ABNORMAL LOW (ref 150–400)
RBC: 3.8 MIL/uL — ABNORMAL LOW (ref 4.22–5.81)
RDW: 15.4 % (ref 11.5–15.5)
WBC: 4.5 10*3/uL (ref 4.0–10.5)

## 2016-03-07 LAB — GLUCOSE, CAPILLARY
GLUCOSE-CAPILLARY: 121 mg/dL — AB (ref 65–99)
Glucose-Capillary: 99 mg/dL (ref 65–99)
Glucose-Capillary: 106 mg/dL — ABNORMAL HIGH (ref 65–99)
Glucose-Capillary: 105 mg/dL — ABNORMAL HIGH (ref 65–99)
Glucose-Capillary: 134 mg/dL — ABNORMAL HIGH (ref 65–99)

## 2016-03-07 LAB — BLOOD CULTURE ID PANEL (REFLEXED)
ACINETOBACTER BAUMANNII: NOT DETECTED
CANDIDA PARAPSILOSIS: NOT DETECTED
Candida albicans: NOT DETECTED
Candida glabrata: NOT DETECTED
Candida krusei: NOT DETECTED
Candida tropicalis: NOT DETECTED
ENTEROBACTERIACEAE SPECIES: NOT DETECTED
ENTEROCOCCUS SPECIES: NOT DETECTED
Enterobacter cloacae complex: NOT DETECTED
Escherichia coli: NOT DETECTED
HAEMOPHILUS INFLUENZAE: NOT DETECTED
Klebsiella oxytoca: NOT DETECTED
Klebsiella pneumoniae: NOT DETECTED
LISTERIA MONOCYTOGENES: NOT DETECTED
METHICILLIN RESISTANCE: DETECTED — AB
NEISSERIA MENINGITIDIS: NOT DETECTED
PSEUDOMONAS AERUGINOSA: NOT DETECTED
Proteus species: NOT DETECTED
SERRATIA MARCESCENS: NOT DETECTED
STAPHYLOCOCCUS AUREUS BCID: NOT DETECTED
STREPTOCOCCUS AGALACTIAE: NOT DETECTED
STREPTOCOCCUS PNEUMONIAE: NOT DETECTED
STREPTOCOCCUS SPECIES: NOT DETECTED
Staphylococcus species: DETECTED — AB
Streptococcus pyogenes: NOT DETECTED

## 2016-03-07 LAB — BASIC METABOLIC PANEL
Anion gap: 11 (ref 5–15)
BUN: 14 mg/dL (ref 6–20)
CALCIUM: 8.1 mg/dL — AB (ref 8.9–10.3)
CHLORIDE: 104 mmol/L (ref 101–111)
CO2: 20 mmol/L — AB (ref 22–32)
CREATININE: 1.1 mg/dL (ref 0.61–1.24)
GFR calc non Af Amer: >60 mL/min (ref >60)
GLUCOSE: 91 mg/dL (ref 65–99)
Potassium: 3.6 mmol/L (ref 3.5–5.1)
Sodium: 135 mmol/L (ref 135–145)

## 2016-03-07 LAB — TROPONIN I: Troponin I: 0.49 ng/mL (ref <0.03)

## 2016-03-07 LAB — MRSA PCR SCREENING: MRSA by PCR: NEGATIVE

## 2016-03-07 MED ORDER — CIPROFLOXACIN HCL 500 MG PO TABS
500.0000 mg | ORAL_TABLET | Freq: Two times a day (BID) | ORAL | Status: DC
  Administered 2016-03-07 – 2016-03-08 (×2): 500 mg via ORAL
  Filled 2016-03-07 (×2): qty 1

## 2016-03-07 MED ORDER — ISOSORBIDE MONONITRATE ER 60 MG PO TB24
60.0000 mg | ORAL_TABLET | Freq: Every day | ORAL | Status: DC
  Administered 2016-03-07 – 2016-03-08 (×2): 60 mg via ORAL
  Filled 2016-03-07 (×2): qty 1

## 2016-03-07 MED ORDER — LOSARTAN POTASSIUM 25 MG PO TABS
25.0000 mg | ORAL_TABLET | Freq: Every day | ORAL | Status: DC
  Administered 2016-03-07 – 2016-03-08 (×2): 25 mg via ORAL
  Filled 2016-03-07 (×2): qty 1

## 2016-03-07 MED ORDER — HYDRALAZINE HCL 20 MG/ML IJ SOLN
10.0000 mg | Freq: Once | INTRAMUSCULAR | Status: AC
  Administered 2016-03-07: 10 mg via INTRAVENOUS
  Filled 2016-03-07: qty 1

## 2016-03-07 NOTE — Evaluation (Signed)
Physical Therapy Evaluation Patient Details Name: Darrell [name] MRN: [id number] DOB: [birth year] Today's Date: 03/07/2016   History of Present Illness  pt presents with UTI and Sepsis.  pt with hx of DM, CAD, NSTEMI, HTN, and CABG.    Clinical Impression  Pt generally slow with mobility and responses to questions, but is oriented and ambulates without deficit.  Spoke with RN who will confirm that this is baseline for pt with family when they visit.  Will keep on PT caseload to ensure return to baseline.      Follow Up Recommendations No PT follow up;Supervision - Intermittent    Equipment Recommendations  None recommended by PT    Recommendations for Other Services       Precautions / Restrictions Precautions Precautions: None Restrictions Weight Bearing Restrictions: No      Mobility  Bed Mobility Overal bed mobility: Independent                Transfers Overall transfer level: Independent Equipment used: None                Ambulation/Gait Ambulation/Gait assistance: Supervision Ambulation Distance (Feet): 150 Feet Assistive device: None Gait Pattern/deviations: Step-through pattern;Decreased stride length     General Gait Details: pt moves slowly, but without deificts.  pt able to perform head turns and converse with PT while ambulating.    Stairs            Wheelchair Mobility    Modified Rankin (Stroke Patients Only)       Balance Overall balance assessment: Needs assistance Sitting-balance support: No upper extremity supported;Feet supported Sitting balance-Leahy Scale: Good     Standing balance support: No upper extremity supported;During functional activity Standing balance-Leahy Scale: Good                               Pertinent Vitals/Pain Pain Assessment: No/denies pain    Home Living Family/patient expects to be discharged to:: Private residence Living Arrangements: Spouse/significant  other;Children Available Help at Discharge: Family;Available 24 hours/day Type of Home: House Home Access: Stairs to enter Entrance Stairs-Rails: None Entrance Stairs-Number of Steps: 2 Home Layout: One level Home Equipment: Walker - 2 wheels      Prior Function Level of Independence: Needs assistance   Gait / Transfers Assistance Needed: Ambulates without AD.    ADL's / Homemaking Assistance Needed: pt indicates he can perform his own ADLs, but that wife performs homemaking tasks.          Hand Dominance        Extremity/Trunk Assessment   Upper Extremity Assessment Upper Extremity Assessment: Overall WFL for tasks assessed    Lower Extremity Assessment Lower Extremity Assessment: Overall WFL for tasks assessed    Cervical / Trunk Assessment Cervical / Trunk Assessment: Normal  Communication   Communication: No difficulties  Cognition Arousal/Alertness: Awake/alert Behavior During Therapy: WFL for tasks assessed/performed Overall Cognitive Status: No family/caregiver present to determine baseline cognitive functioning                 General Comments: pt is oriented, but generally slow.  Spoke with RN and unsure if this is baseline for pt.  RN to check with family when they arrive.      General Comments      Exercises     Assessment/Plan    PT Assessment Patient needs continued PT services  PT Problem List Decreased activity tolerance;Decreased balance;Decreased  mobility;Decreased knowledge of use of DME          PT Treatment Interventions DME instruction;Gait training;Stair training;Functional mobility training;Therapeutic activities;Therapeutic exercise;Balance training;Patient/family education    PT Goals (Current goals can be found in the Care Plan section)  Acute Rehab PT Goals Patient Stated Goal: Get home. PT Goal Formulation: With patient Time For Goal Achievement: 03/14/16 Potential to Achieve Goals: Good    Frequency Min 3X/week    Barriers to discharge        Co-evaluation               End of Session Equipment Utilized During Treatment: Gait belt Activity Tolerance: Patient tolerated treatment well Patient left: in bed;with call bell/phone within reach Nurse Communication: Mobility status         Time: 9604-5409 PT Time Calculation (min) (ACUTE ONLY): 14 min   Charges:   PT Evaluation $PT Eval Low Complexity: 1 Procedure     PT G CodesAlison Murray Darrik Richman, PT  [phone] 03/07/2016, 10:26 AM

## 2016-03-07 NOTE — Progress Notes (Signed)
PHARMACY - PHYSICIAN COMMUNICATION CRITICAL VALUE ALERT - BLOOD CULTURE IDENTIFICATION (BCID)  Results for orders placed or performed during the hospital encounter of 03/02/16  Blood Culture ID Panel (Reflexed) (Collected: 03/06/2016  8:56 AM)  Result Value Ref Range   Enterococcus species NOT DETECTED NOT DETECTED   Listeria monocytogenes NOT DETECTED NOT DETECTED   Staphylococcus species DETECTED (A) NOT DETECTED   Staphylococcus aureus NOT DETECTED NOT DETECTED   Methicillin resistance DETECTED (A) NOT DETECTED   Streptococcus species NOT DETECTED NOT DETECTED   Streptococcus agalactiae NOT DETECTED NOT DETECTED   Streptococcus pneumoniae NOT DETECTED NOT DETECTED   Streptococcus pyogenes NOT DETECTED NOT DETECTED   Acinetobacter baumannii NOT DETECTED NOT DETECTED   Enterobacteriaceae species NOT DETECTED NOT DETECTED   Enterobacter cloacae complex NOT DETECTED NOT DETECTED   Escherichia coli NOT DETECTED NOT DETECTED   Klebsiella oxytoca NOT DETECTED NOT DETECTED   Klebsiella pneumoniae NOT DETECTED NOT DETECTED   Proteus species NOT DETECTED NOT DETECTED   Serratia marcescens NOT DETECTED NOT DETECTED   Haemophilus influenzae NOT DETECTED NOT DETECTED   Neisseria meningitidis NOT DETECTED NOT DETECTED   Pseudomonas aeruginosa NOT DETECTED NOT DETECTED   Candida albicans NOT DETECTED NOT DETECTED   Candida glabrata NOT DETECTED NOT DETECTED   Candida krusei NOT DETECTED NOT DETECTED   Candida parapsilosis NOT DETECTED NOT DETECTED   Candida tropicalis NOT DETECTED NOT DETECTED    Name of physician (or Provider) Contacted: Dr. Roda ShuttersXu  Changes to prescribed antibiotics required: none, as likely contaminant. Continue Ceftriaxone 2 grams IV every 24 hours for known E.coli bacteremia.   Pollyann SamplesAndy Kellie Murrill, PharmD, BCPS 03/07/2016, 9:56 AM

## 2016-03-07 NOTE — Hospital Discharge Follow-Up (Signed)
Transitional Care Clinic Care Coordination Note:  Admit date:  03/02/16 Discharge date: TBD   Discharge Disposition: TBD Patient contact: [phone] (mobile) Emergency contact(s): Chandra Feger (son)-#[phone]  This Case Manager reviewed patient's EMR and determined patient would benefit from post-discharge medical management and chronic care management services through the [facility]. Patient has a history of HTN, hyperlipidemia, type 2 diabetes mellitus, CAD. Receiving treatment for E.coli UTI, bacteremia, sepsis, chest pain, HTN urgency. Patient has had three inpatient admissions in the last six months. This Case Manager met with patient to discuss the services and medical management that can be provided at the [facility]. Patient verbalized understanding and agreed to receive post-discharge care at the [facility].   Patient scheduled for Transitional Care appointment on 03/12/16 at [facility] with Dr. Jarold Song.  Clinic information and appointment time provided to patient. Appointment information also placed on AVS.  Assessment:       Home Environment: Patient lives with his spouse and son in a private residence.       Support System: family members       Level of functioning: independent       Home DME: none       Home care services: none though inpatient Case Manager following to see if patient will need home health RN for IV antibiotics.       Transportation: Jacqlyn Krauss, RN CM indicated patient has transportation barriers. Patient indicated his son or his son's friends usually drive him to his medical appointments. Informed patient that transportation can be provided for him to upcoming Transitional Care appointment if needed. Patient verbalized understanding. Transitional Care Case Manager will need to determine if transportation needed when post-discharge follow-up phone call made. Patient may also benefit from assistance with SCAT  application at upcoming appointment.        Food/Nutrition: Patient indicated he has access to needed food.        Pharmacy: Patient indicated he typically gets his medications from [facility] on [address]. Patient indicated he is on a fixed income so he sometimes has problems affording his medications. Informed patient of the medication resources available at the pharmacy at [facility]. Patient appreciative of information.        Identified Barriers: possible transportation barrier, difficulty affording medications, lack of PCP        PCP: none. Patient able to designate Dr. Jarold Song (or provider of choice) as PCP after 30 days of medical management with the [facility] and [facility].             Arranged services:        Services communicated to Jacqlyn Krauss, RN CM

## 2016-03-07 NOTE — Care Management Note (Addendum)
Case Management Note  Patient Details  Name: Darrell [name] MRN: [id number] Date of Birth: 1955-03-20  Subjective/Objective:  Pt presented for Sepsis- Ecoli UTI/Ecoli bacteremia/sepsis presented on admission with fever 104, sinus tachycardia. Pt is from home with wife and son.                  Action/Plan: CM received referral for transportation needs/ home needs. CM did speak with pt in regards to disposition. CM monitoring to see if pt will need HHRN for IV Antibiotics. Pt did discuss that he needs assistance with transportation. CSW aware and will provide resources. [facility] and no appointments available. [facility]. CM will continue to monitor for needs.   Expected Discharge Date:                  Expected Discharge Plan:  Home w Home Health Services  In-House Referral:  Clinical Social Work Geophysical data processor([employer]portation Resources. )  Discharge planning Services  CM Consult, [facility], Follow Up Appointment Scheduled.   Post Acute Care Choice:   N/A Choice offered to:   N/A  DME Arranged:   N/A DME Agency:   N/A  HH Arranged:   N/A HH Agency:   N/A  Status of Service:  COMPLETED If discussed at Long Length of Stay Meetings, dates discussed:    Additional Comments: 1000 03-08-16 Tomi BambergerBrenda Graves-Bigelow, RN,BSN [phone] CM [facility] to [facility]. No HH Services to be set up. Patient states his wife will be home to assist with medications. [facility]. SKAT Application to be provided by CSW and TCC will assist with filling out. No further needs from CM at this time.    1517 03-07-16 Tomi BambergerBrenda Graves-Bigelow, RN,BSN [phone]  [facility]. CM will continue to f/u.  Gala LewandowskyGraves-Bigelow, Kyal Arts Kaye, RN 03/07/2016, 12:18 PM

## 2016-03-07 NOTE — Progress Notes (Signed)
PROGRESS NOTE    Darrell [name]  ZOX:[id number] DOB: 1955/05/04 DOA: 03/02/2016 PCP: No PCP Per Patient   Brief Narrative: 61 y.o. male with medical history significant of HTN, HLD, DM2, CAD, claudication who presents for chest pain.  Darrell [name] was admitted in November of 2017 for an NSTEMI and was treated with medical management.    ED, the patient was found to be tachycardic and febrile to 104.  He had a normal CXR, US showed many bacteria  And leukocytes.   Assessment & Plan:   Ecoli UTI/Ecoli bacteremia/sepsis presented on admission with fever 104, sinus tachycardia, leukocytosis (wbc 16.7), lactic acidosis  (lactic acid 3.23) --repeat blood culture obtained on 2/7 grow coag negative staph from one out of two blood culture, this is likely contamination -He received vanc/zosyn in the ED, abx changed to rocephin since admission, clinically improving, transition to oral cipro on 2/8.  Thrombocytopenia: mild , likely from sepsis Nadir at 99, improving   Chest pain (chief compliant on presentation)/hypertension urgency ( bp on arrival  To ED was 212/124), (reports out of meds for a months, this has been a recurrent issue , he has similar presentations with hypertensive urgency and chest pain in the past ) H/o Coronary atherosclerosis, s/p CABG in 2009 , h/o pci with stent,  He has gone one month off imdur and plavix  troponin on presentation was 0.46, seems to peak at 1.12, ekg with chronic inferalateral st depression, no acute interval changes, He was started on heparin drip and nitrodrip, his chest pain has resolved the next day with blood pressure control Cardiology consulted on 2/4 and signed off on 2/4. "[name], statin, ? blocker, nitrate, [name], and plavix.  Would not pursue ischemic eval @ this point." "Given the absence of symptoms I do not think that further invasive or noninvasive testing is indicated.  He has been out of his meds  and is no compliant with follow up in our office.  He needs to resume previous meds and keep follow up appts.   Darrell [name]  3:33 PM  03/03/2016"   HTN urgency: presented on admission with chest pain, bp on arrival to ED was 212/124 He was started on nitro drip, blood pressure has improved, chest pain has resolve.  Nitrodrip stopped, continue coreg/imdur,  Increase imdur, restart cozaar on 2/8, continue adjust meds to achieve goal.   AKI on CKD II: aki likely from sepsis and uti Cr peaked at 1.9, trending down Home meds cozaar held since admission, cr normalized on 2/8,. Restart cozaar on 2/8  Noinsulin dependent Type 2 diabetes mellitus - a1c 5.2, on ssi here - home meds metformin held since admission, likely able to resume at discharge pending renal function improvement    Medication and medical follow up noncompliance, per chart review, patient has recent hospitalizaion in 11/2015, 12/2015 and 02/2016 for recurrent chest pain. No pmd or cardiology follow up, he report has been out of meds for a month prior to this hospitalization. Report no reliable transportation. Case manager consulted.  DVT prophylaxis: he was on Heparin drip, changed to lovenox prophylaxis dose Code Status: Full Family Communication: none at bedside Disposition Plan: d/c home on oral abx, likely on 2/9.   Consultants:   Consult Cardiology   Procedures: none   Antimicrobials: vanc initially, rocephin from admission to 2/8, cipro from 2/8.   Subjective: Feeling better, no acute complaints, bp better  Objective: Vitals:   03/07/16 0600  03/07/16 0636 03/07/16 0644 03/07/16 0650  BP: (!) 199/110 (!) 196/101  (!) 178/102  Pulse:      Resp:      Temp:      TempSrc:      SpO2: 98%     Weight:   96.2 kg (212 lb)   Height:        Intake/Output Summary (Last 24 hours) at 03/07/16 0756 Last data filed at 03/07/16 0500  Gross per 24 hour  Intake             1200 ml  Output             2175 ml  Net              -975 ml   Filed Weights   03/02/16 2112 03/05/16 0355 03/07/16 0644  Weight: 96.2 kg (212 lb) 97.9 kg (215 lb 12.8 oz) 96.2 kg (212 lb)    Examination:  General exam: Appears calm and comfortable, in nad. Respiratory system: Clear to auscultation. Respiratory effort normal. Cardiovascular system: S1 & S2 heard, RRR. No rubs Gastrointestinal system: Abdomen is nondistended, soft and nontender. No organomegaly or masses felt. Normal bowel sounds heard. Central nervous system: Alert and oriented. No focal neurological deficits. Extremities: Symmetric 5 x 5 power. Skin: No rashes, lesions or ulcers, on limited exam. Psychiatry: Judgement and insight appear normal. Mood & affect appropriate.  Neuro: AAOx3, does has poor short term memory, report chronic problem.  Data Reviewed: I have personally reviewed following labs and imaging studies  CBC:  Recent Labs Lab 03/02/16 1531 03/02/16 2211 03/04/16 1100 03/05/16 0202 03/07/16 0336  WBC 5.5 14.7* 16.4* 13.8* 4.5  NEUTROABS 4.7  --   --   --  2.7  HGB 14.1 12.2* 10.4* 10.6* 10.3*  HCT 42.4 36.3* 31.1* 32.5* 31.8*  MCV 85.0 83.8 83.8 83.8 83.7  PLT 157 128* 96* 99* 106*   Basic Metabolic Panel:  Recent Labs Lab 03/02/16 1531 03/02/16 2211 03/04/16 1100 03/05/16 0202 03/07/16 0336  NA 137  --  134* 134* 135  K 4.9  --  3.1* 3.5 3.6  CL 104  --  106 106 104  CO2 23  --  18* 17* 20*  GLUCOSE 126*  --  120* 100* 91  BUN 24*  --  27* 21* 14  CREATININE 1.44* 1.90* 1.65* 1.44* 1.10  CALCIUM 8.8*  --  7.2* 7.8* 8.1*   GFR: Estimated Creatinine Clearance: 88.4 mL/min (by C-G formula based on SCr of 1.1 mg/dL). Liver Function Tests:  Recent Labs Lab 03/02/16 2211  AST 26  ALT 13*  ALKPHOS 60  BILITOT 1.2  PROT 5.9*  ALBUMIN 2.6*   No results for input(s): LIPASE, AMYLASE in the last 168 hours. No results for input(s): AMMONIA in the last 168 hours. Coagulation Profile:  Recent Labs Lab 03/02/16 2211   INR 1.28   Cardiac Enzymes:  Recent Labs Lab 03/02/16 2211 03/03/16 0235 03/03/16 0820 03/07/16 0336  TROPONINI 0.46* 0.70* 1.12* 0.49*   BNP (last 3 results) No results for input(s): PROBNP in the last 8760 hours. HbA1C: No results for input(s): HGBA1C in the last 72 hours. CBG:  Recent Labs Lab 03/06/16 1613 03/06/16 2005 03/06/16 2357 03/07/16 0511 03/07/16 0752  GLUCAP 107* 156* 108* 99 106*   Lipid Profile: No results for input(s): CHOL, HDL, LDLCALC, TRIG, CHOLHDL, LDLDIRECT in the last 72 hours. Thyroid Function Tests: No results for input(s): TSH, T4TOTAL, FREET4, T3FREE, THYROIDAB in  the last 72 hours. Anemia Panel: No results for input(s): VITAMINB12, FOLATE, FERRITIN, TIBC, IRON, RETICCTPCT in the last 72 hours. Sepsis Labs:  Recent Labs Lab 03/02/16 1738 03/02/16 2020 03/02/16 2211 03/03/16 0235  LATICACIDVEN 3.23* 2.33* 2.7* 2.4*    Recent Results (from the past 240 hour(s))  Culture, blood (routine x 2)     Status: Abnormal   Collection Time: 03/02/16  5:23 PM  Result Value Ref Range Status   Specimen Description BLOOD RIGHT ANTECUBITAL  Final   Special Requests BOTTLES DRAWN AEROBIC AND ANAEROBIC 4CC  Final   Culture  Setup Time   Final    IN BOTH AEROBIC AND ANAEROBIC BOTTLES GRAM NEGATIVE RODS CRITICAL VALUE NOTED.  VALUE IS CONSISTENT WITH PREVIOUSLY REPORTED AND CALLED VALUE.    Culture (A)  Final    ESCHERICHIA COLI SUSCEPTIBILITIES PERFORMED ON PREVIOUS CULTURE WITHIN THE LAST 5 DAYS.    Report Status 03/05/2016 FINAL  Final  Urine culture     Status: Abnormal   Collection Time: 03/02/16  5:24 PM  Result Value Ref Range Status   Specimen Description URINE, CLEAN CATCH  Final   Special Requests NONE  Final   Culture >=100,000 COLONIES/mL ESCHERICHIA COLI (A)  Final   Report Status 03/05/2016 FINAL  Final   Organism ID, Bacteria ESCHERICHIA COLI (A)  Final      Susceptibility   Escherichia coli - MIC*    AMPICILLIN >=32  RESISTANT Resistant     CEFAZOLIN <=4 SENSITIVE Sensitive     CEFTRIAXONE <=1 SENSITIVE Sensitive     CIPROFLOXACIN <=0.25 SENSITIVE Sensitive     GENTAMICIN <=1 SENSITIVE Sensitive     IMIPENEM <=0.25 SENSITIVE Sensitive     NITROFURANTOIN <=16 SENSITIVE Sensitive     TRIMETH/SULFA <=20 SENSITIVE Sensitive     AMPICILLIN/SULBACTAM >=32 RESISTANT Resistant     PIP/TAZO <=4 SENSITIVE Sensitive     Extended ESBL NEGATIVE Sensitive     * >=100,000 COLONIES/mL ESCHERICHIA COLI  Culture, blood (routine x 2)     Status: Abnormal   Collection Time: 03/02/16  5:30 PM  Result Value Ref Range Status   Specimen Description BLOOD LEFT HAND  Final   Special Requests BOTTLES DRAWN AEROBIC AND ANAEROBIC 5CC  Final   Culture  Setup Time   Final    IN BOTH AEROBIC AND ANAEROBIC BOTTLES GRAM NEGATIVE RODS CRITICAL RESULT CALLED TO, READ BACK BY AND VERIFIED WITH: J ARMINGER,PHARMD AT 0716 03/03/16 BY L BENFIELD    Culture ESCHERICHIA COLI (A)  Final   Report Status 03/05/2016 FINAL  Final   Organism ID, Bacteria ESCHERICHIA COLI  Final      Susceptibility   Escherichia coli - MIC*    AMPICILLIN >=32 RESISTANT Resistant     CEFAZOLIN <=4 SENSITIVE Sensitive     CEFEPIME <=1 SENSITIVE Sensitive     CEFTAZIDIME <=1 SENSITIVE Sensitive     CEFTRIAXONE <=1 SENSITIVE Sensitive     CIPROFLOXACIN <=0.25 SENSITIVE Sensitive     GENTAMICIN <=1 SENSITIVE Sensitive     IMIPENEM <=0.25 SENSITIVE Sensitive     TRIMETH/SULFA <=20 SENSITIVE Sensitive     AMPICILLIN/SULBACTAM >=32 RESISTANT Resistant     PIP/TAZO <=4 SENSITIVE Sensitive     Extended ESBL NEGATIVE Sensitive     * ESCHERICHIA COLI  Blood Culture ID Panel (Reflexed)     Status: Abnormal   Collection Time: 03/02/16  5:30 PM  Result Value Ref Range Status   Enterococcus species NOT DETECTED NOT DETECTED Final  Listeria monocytogenes NOT DETECTED NOT DETECTED Final   Staphylococcus species NOT DETECTED NOT DETECTED Final   Staphylococcus aureus  NOT DETECTED NOT DETECTED Final   Streptococcus species NOT DETECTED NOT DETECTED Final   Streptococcus agalactiae NOT DETECTED NOT DETECTED Final   Streptococcus pneumoniae NOT DETECTED NOT DETECTED Final   Streptococcus pyogenes NOT DETECTED NOT DETECTED Final   Acinetobacter baumannii NOT DETECTED NOT DETECTED Final   Enterobacteriaceae species DETECTED (A) NOT DETECTED Final    Comment: Enterobacteriaceae represent a large family of gram-negative bacteria, not a single organism. CRITICAL RESULT CALLED TO, READ BACK BY AND VERIFIED WITH: J ARMINGER,PHARMD AT 0716 03/03/16 BY L BENFIELD    Enterobacter cloacae complex NOT DETECTED NOT DETECTED Final   Escherichia coli DETECTED (A) NOT DETECTED Final    Comment: CRITICAL RESULT CALLED TO, READ BACK BY AND VERIFIED WITH: J ARMINGER,PHARMD AT 0716 03/03/16 BY L BENFIELD    Klebsiella oxytoca NOT DETECTED NOT DETECTED Final   Klebsiella pneumoniae NOT DETECTED NOT DETECTED Final   Proteus species NOT DETECTED NOT DETECTED Final   Serratia marcescens NOT DETECTED NOT DETECTED Final   Carbapenem resistance NOT DETECTED NOT DETECTED Final   Haemophilus influenzae NOT DETECTED NOT DETECTED Final   Neisseria meningitidis NOT DETECTED NOT DETECTED Final   Pseudomonas aeruginosa NOT DETECTED NOT DETECTED Final   Candida albicans NOT DETECTED NOT DETECTED Final   Candida glabrata NOT DETECTED NOT DETECTED Final   Candida krusei NOT DETECTED NOT DETECTED Final   Candida parapsilosis NOT DETECTED NOT DETECTED Final   Candida tropicalis NOT DETECTED NOT DETECTED Final  Culture, blood (routine x 2)     Status: None (Preliminary result)   Collection Time: 03/06/16  8:56 AM  Result Value Ref Range Status   Specimen Description BLOOD BLOOD LEFT HAND  Final   Special Requests IN PEDIATRIC BOTTLE 3CC  Final   Culture  Setup Time   Final    GRAM POSITIVE COCCI IN CLUSTERS IN PEDIATRIC BOTTLE Organism ID to follow    Culture PENDING  Incomplete    Report Status PENDING  Incomplete    Radiology Studies: No results found. Scheduled Meds: . aspirin EC  81 mg Oral Daily  . atorvastatin  80 mg Oral Daily  . carvedilol  25 mg Oral BID WC  . cefTRIAXone (ROCEPHIN)  IV  2 g Intravenous Q24H  . clopidogrel  75 mg Oral Daily  . enoxaparin (LOVENOX) injection  40 mg Subcutaneous Q24H  . insulin aspart  0-9 Units Subcutaneous Q4H  . isosorbide mononitrate  60 mg Oral Daily  . losartan  25 mg Oral Daily  . senna-docusate  2 tablet Oral BID  . sodium chloride flush  3 mL Intravenous Q12H   Continuous Infusions:    LOS: 5 days    Time spent: > 35 minutes  Adison Jerger, MD PhD [facility] Pager [phone]  If 7PM-7AM, please contact night-coverage [email or web address] Password [email or web address] 03/07/2016, 7:56 AM

## 2016-03-08 LAB — CBC
HCT: 32.7 % — ABNORMAL LOW (ref 39.0–52.0)
HEMOGLOBIN: 10.5 g/dL — AB (ref 13.0–17.0)
MCH: 26.8 pg (ref 26.0–34.0)
MCHC: 32.1 g/dL (ref 30.0–36.0)
MCV: 83.4 fL (ref 78.0–100.0)
PLATELETS: 132 10*3/uL — AB (ref 150–400)
RBC: 3.92 MIL/uL — ABNORMAL LOW (ref 4.22–5.81)
RDW: 15.2 % (ref 11.5–15.5)
WBC: 4.5 10*3/uL (ref 4.0–10.5)

## 2016-03-08 LAB — MAGNESIUM: MAGNESIUM: 1.7 mg/dL (ref 1.7–2.4)

## 2016-03-08 LAB — BASIC METABOLIC PANEL
Anion gap: 10 (ref 5–15)
BUN: 14 mg/dL (ref 6–20)
CALCIUM: 8.1 mg/dL — AB (ref 8.9–10.3)
CHLORIDE: 104 mmol/L (ref 101–111)
CO2: 21 mmol/L — ABNORMAL LOW (ref 22–32)
CREATININE: 1.1 mg/dL (ref 0.61–1.24)
Glucose, Bld: 98 mg/dL (ref 65–99)
Potassium: 3.6 mmol/L (ref 3.5–5.1)
SODIUM: 135 mmol/L (ref 135–145)

## 2016-03-08 LAB — GLUCOSE, CAPILLARY
GLUCOSE-CAPILLARY: 100 mg/dL — AB (ref 65–99)
GLUCOSE-CAPILLARY: 146 mg/dL — AB (ref 65–99)
GLUCOSE-CAPILLARY: 95 mg/dL (ref 65–99)
Glucose-Capillary: 105 mg/dL — ABNORMAL HIGH (ref 65–99)

## 2016-03-08 LAB — PSA: PSA: 0.38 ng/mL (ref 0.00–4.00)

## 2016-03-08 MED ORDER — ISOSORBIDE MONONITRATE ER 60 MG PO TB24
60.0000 mg | ORAL_TABLET | Freq: Every day | ORAL | 0 refills | Status: DC
Start: 2016-03-08 — End: 2016-03-12

## 2016-03-08 MED ORDER — CIPROFLOXACIN HCL 500 MG PO TABS: 500.0000 mg | ORAL_TABLET | Freq: Two times a day (BID) | ORAL | 0 refills | Status: DC

## 2016-03-08 MED FILL — ISOSORBIDE MN ER 60 MG TAB: 60 | 30 days supply | Qty: 30 | Fill #0

## 2016-03-08 MED FILL — ?CIPROFLOXACIN HCL 500MG TA: 500 | 8 days supply | Qty: 16 | Fill #0

## 2016-03-08 NOTE — Progress Notes (Signed)
At discharge pt stated he did not know how he was going to afford his medications. Pt stated he had no money. Case manager on call made aware, who stated [facility] and they could help him set up a payment plan. Information passed along to patient. Pt wheeled down to volunteer services. Pt given taxi voucher.

## 2016-03-08 NOTE — Care Management Important Message (Signed)
Important Message  Patient Details  Name: Darrell [name] MRN: [id number] Date of Birth: 27-Jun-1955   Medicare Important Message Given:  Yes    [name] 03/08/2016, 2:01 PM

## 2016-03-08 NOTE — Progress Notes (Signed)
Discharge instruction reviewed with patient. IV dc. Pt has no questions at this time. Taxi voucher given to patient.

## 2016-03-08 NOTE — Discharge Summary (Signed)
Discharge Summary  Darrell [name] MRN:[id number] DOB: 03/16/1955  PCP: No PCP Per Patient  Admit date: 03/02/2016 Discharge date: 03/08/2016  Time spent: <30mins  Recommendations for Outpatient Follow-up:  1. F/u with [facility] on 2/13  for hospital discharge follow up, repeat cbc/bmp at follow up. pmd to continue adjust blood pressure meds. Monitor renal function. 2. F/u with cardiology  Discharge Diagnoses:  Active Hospital Problems   Diagnosis Date Noted  . Sepsis (HCC) 03/02/2016  . HTN (hypertension) 04/04/2015  . Type 2 diabetes mellitus without complication, without long-term current use of insulin (HCC) 01/29/2003  . Coronary atherosclerosis, s/p CABG in 2009  01/28/2001    Resolved Hospital Problems   Diagnosis Date Noted Date Resolved  No resolved problems to display.    Discharge Condition: stable  Diet recommendation: heart healthy/carb modified  Filed Weights   03/05/16 0355 03/07/16 0644 03/08/16 0500  Weight: 97.9 kg (215 lb 12.8 oz) 96.2 kg (212 lb) 95.2 kg (209 lb 12.8 oz)    History of present illness:  Chief Complaint: Chest pain  HPI: Darrell [name] is a 60 y.o. male with medical history significant of HTN, HLD, DM2, CAD, claudication who presents for chest pain.  Darrell [name] was admitted in November of 2017 for an NSTEMI and was treated with medical management.  He reports that the chest pain is similar to that time.  He states the pain is in his central chest, "sharp like a knife" and associated with nausea.  It does not radiate, not associated with diaphoresis or SOB.  He has been out of his medications for a month including IMDUR and plavix.  His last stent was placed in March of 2017 per chart review.  The pain started while he was lying in bed.  It sometimes gets worse with movement, is not reproducible.  He is a former smoker.  Initial troponin in the ED 0.01.  Heart score 4 points (moderate risk).     While in the ED, the patient was found to be tachycardic and febrile to 104.  He had a normal CXR, US showed many bacteria  And leukocytes.  His only other symptoms are occasional chills for 1 day and some back pain which came on this morning.  He attributes this to kidney pain.  He has no cough, SOB, documented fever, diarrhea, constipation.  He has no new sexual partners.  He denies burning pain when he urinates, abdominal pain.  He had one episode of emesis in the ED, is asking to eat now.  Wife was recently sick with a viral illness.  ED Course: In the ED, he was initially evaluated for chest pain, found to be febrile with tachycardia, SIRS criteria with likely source the urine.  He was initiated on vancomycin and zosyn to start broad coverage.    Hospital Course:  Active Problems:   Type 2 diabetes mellitus without complication, without long-term current use of insulin (HCC)   Coronary atherosclerosis, s/p CABG in 2009    HTN (hypertension)   Sepsis (HCC)  Ecoli UTI/Ecoli bacteremia/sepsis presented on admission with fever 104, sinus tachycardia, leukocytosis (wbc 16.7), lactic acidosis  (lactic acid 3.23) --repeat blood culture obtained on 2/7 grow coag negative staph from one out of two blood culture, this is likely contamination -He received vanc/zosyn in the ED, abx changed to rocephin since admission, clinically improving, transition to oral cipro on 2/8., discharge on cipro for 8more days to finish total   of 14days treatment.   Thrombocytopenia: mild , likely from sepsis Nadir at 99, improving, plt 132 at discharge   Chest pain (chief compliant on presentation)/hypertension urgency ( bp on arrival  To ED was 212/124), (reports out of meds for a months, this has been a recurrent issue , he has similar presentations with hypertensive urgency and chest pain in the past ) H/o Coronary atherosclerosis, s/p CABG in 2009 , h/o pci with stent,  He has gone one month off imdur and  plavix  troponin on presentation was 0.46, seems to peak at 1.12, ekg with chronic inferalateral st depression, no acute interval changes, He was started on heparin drip and nitrodrip, his chest pain has resolved the next day with blood pressure control Cardiology consulted on 2/4 and signed off on 2/4. "[name], statin, ?blocker, nitrate, [name], and plavix. Would not pursue ischemic eval @ this point." "Given the absence of symptoms I do not think that further invasive or noninvasive testing is indicated. He has been out of his meds and is no compliant with follow up in our office. He needs to resume previous meds and keep follow up appts. Darrell Hochrein3:33 PM2/04/2016"   HTN urgency: presented on admission with chest pain, bp on arrival to ED was 212/124 He was started on nitro drip, blood pressure has improved, chest pain has resolve.  Nitrodrip stopped, continue coreg/imdur,  Increase imdur, restart cozaar on 2/8, continue adjust meds to achieve goal. Resume norvasc at discharge. Follow up with pmd to continue monitor bp control.   AKI on CKD II: aki likely from sepsis and uti Cr peaked at 1.9, trending down Home meds cozaar held since admission, cr normalized on 2/8,. Restart cozaar on 2/8  Noinsulin dependent Type 2 diabetes mellitus - a1c 5.2, on ssi here - home meds metformin held since admission,  resume at discharge     Medication and medical follow up noncompliance, per chart review, patient has recent hospitalizaion in 11/2015, 12/2015 and 02/2016 for recurrent chest pain. No pmd or cardiology follow up, he report has been out of meds for a month prior to this hospitalization. Report no reliable transportation. Case manager consulted. He is to follow up with [facility], please see case manager note.  DVT prophylaxis: he was on Heparin drip, changed to lovenox prophylaxis dose Code  Status: Full Family Communication: none at bedside Disposition Plan: d/c home on oral abx on 2/9.   Consultants:   Cardiology   Procedures: none   Antimicrobials: vanc initially, rocephin from admission to 2/8, cipro from 2/8.   Discharge Exam: BP (!) 175/97   Pulse 74   Temp 98.8 F (37.1 C)   Resp 16   Ht 6' 1" (1.854 m)   Wt 95.2 kg (209 lb 12.8 oz)   SpO2 97%   BMI 27.68 kg/m   General: NAD Cardiovascular: RRR Respiratory: CATBL  Discharge Instructions You were cared for by a hospitalist during your hospital stay. If you have any questions about your discharge medications or the care you received while you were in the hospital after you are discharged, you can call the unit and asked to speak with the hospitalist on call if the hospitalist that took care of you is not available. Once you are discharged, your primary care physician will handle any further medical issues. Please note that NO REFILLS for any discharge medications will be authorized once you are discharged, as   it is imperative that you return to your primary care physician (or establish a relationship with a primary care physician if you do not have one) for your aftercare needs so that they can reassess your need for medications and monitor your lab values.  Discharge Instructions    Diet - low sodium heart healthy    Complete by:  As directed    Carb modified.   Increase activity slowly    Complete by:  As directed      Allergies as of 03/08/2016   No Known Allergies     Medication List    TAKE these medications   amLODipine 10 MG tablet Commonly known as:  NORVASC Take 1 tablet (10 mg total) by mouth daily.   aspirin 81 MG EC tablet Take 1 tablet (81 mg total) by mouth daily.   atorvastatin 80 MG tablet Commonly known as:  LIPITOR Take 1 tablet (80 mg total) by mouth daily.   carvedilol 25 MG tablet Commonly known as:  COREG Take 1 tablet (25 mg total) by mouth 2 (two) times daily  with a meal.   ciprofloxacin 500 MG tablet Commonly known as:  CIPRO Take 1 tablet (500 mg total) by mouth 2 (two) times daily.   clopidogrel 75 MG tablet Commonly known as:  PLAVIX Take 1 tablet (75 mg total) by mouth daily.   glucose monitoring kit monitoring kit 1 each by Does not apply route once. Glucometer with test strips and lancets to check blood glucose BID ac breakfast and dinner.   isosorbide mononitrate 60 MG 24 hr tablet Commonly known as:  IMDUR Take 1 tablet (60 mg total) by mouth daily. What changed:  medication strength  how much to take   losartan 25 MG tablet Commonly known as:  COZAAR Take 1 tablet (25 mg total) by mouth daily.   metFORMIN 500 MG tablet Commonly known as:  GLUCOPHAGE Take 1 tablet (500 mg total) by mouth 2 (two) times daily with a meal.   nitroGLYCERIN 0.4 MG SL tablet Commonly known as:  NITROSTAT Place 1 tablet (0.4 mg total) under the tongue every 5 (five) minutes as needed for chest pain.      No Known Allergies Follow-up Information    [facility] Follow up on 03/12/2016.   Why:  [facility] appointment on 03/12/16 at 9:45 am with Dr. Jarold Song. repeat cbc/bmp at follow up. Contact information: [place] [id number] [place], [place] Follow up in 3 week(s).   Specialty:  Cardiology Why:  for CAD Contact information: [address] [place] [address] [phone]            The results of significant diagnostics from this hospitalization (including imaging, microbiology, ancillary and laboratory) are listed below for reference.    Significant Diagnostic Studies: Dg Chest 2 View  Result Date: 03/02/2016 CLINICAL DATA:  Cough.  Chest pain.  Fever. EXAM: CHEST  2 VIEW COMPARISON:  01/03/2016 chest radiograph. FINDINGS: Sternotomy wires appear aligned and intact. CABG clips overlie the mediastinum. Stable  cardiomediastinal silhouette with normal heart size. No pneumothorax. No pleural effusion. Lungs appear clear, with no acute consolidative airspace disease and no pulmonary edema. IMPRESSION: No active cardiopulmonary disease. Electronically Signed   By: Ilona Sorrel M.D.   On: 03/02/2016 16:35    Microbiology: Recent Results (from the past 240 hour(s))  Culture, blood (routine x 2)  Status: Abnormal   Collection Time: 03/02/16  5:23 PM  Result Value Ref Range Status   Specimen Description BLOOD RIGHT ANTECUBITAL  Final   Special Requests BOTTLES DRAWN AEROBIC AND ANAEROBIC 4CC  Final   Culture  Setup Time   Final    IN BOTH AEROBIC AND ANAEROBIC BOTTLES GRAM NEGATIVE RODS CRITICAL VALUE NOTED.  VALUE IS CONSISTENT WITH PREVIOUSLY REPORTED AND CALLED VALUE.    Culture (A)  Final    ESCHERICHIA COLI SUSCEPTIBILITIES PERFORMED ON PREVIOUS CULTURE WITHIN THE LAST 5 DAYS.    Report Status 03/05/2016 FINAL  Final  Urine culture     Status: Abnormal   Collection Time: 03/02/16  5:24 PM  Result Value Ref Range Status   Specimen Description URINE, CLEAN CATCH  Final   Special Requests NONE  Final   Culture >=100,000 COLONIES/mL ESCHERICHIA COLI (A)  Final   Report Status 03/05/2016 FINAL  Final   Organism ID, Bacteria ESCHERICHIA COLI (A)  Final      Susceptibility   Escherichia coli - MIC*    AMPICILLIN >=32 RESISTANT Resistant     CEFAZOLIN <=4 SENSITIVE Sensitive     CEFTRIAXONE <=1 SENSITIVE Sensitive     CIPROFLOXACIN <=0.25 SENSITIVE Sensitive     GENTAMICIN <=1 SENSITIVE Sensitive     IMIPENEM <=0.25 SENSITIVE Sensitive     NITROFURANTOIN <=16 SENSITIVE Sensitive     TRIMETH/SULFA <=20 SENSITIVE Sensitive     AMPICILLIN/SULBACTAM >=32 RESISTANT Resistant     PIP/TAZO <=4 SENSITIVE Sensitive     Extended ESBL NEGATIVE Sensitive     * >=100,000 COLONIES/mL ESCHERICHIA COLI  Culture, blood (routine x 2)     Status: Abnormal   Collection Time: 03/02/16  5:30 PM  Result Value  Ref Range Status   Specimen Description BLOOD LEFT HAND  Final   Special Requests BOTTLES DRAWN AEROBIC AND ANAEROBIC 5CC  Final   Culture  Setup Time   Final    IN BOTH AEROBIC AND ANAEROBIC BOTTLES GRAM NEGATIVE RODS CRITICAL RESULT CALLED TO, READ BACK BY AND VERIFIED WITH: J ARMINGER,PHARMD AT 0716 03/03/16 BY L BENFIELD    Culture ESCHERICHIA COLI (A)  Final   Report Status 03/05/2016 FINAL  Final   Organism ID, Bacteria ESCHERICHIA COLI  Final      Susceptibility   Escherichia coli - MIC*    AMPICILLIN >=32 RESISTANT Resistant     CEFAZOLIN <=4 SENSITIVE Sensitive     CEFEPIME <=1 SENSITIVE Sensitive     CEFTAZIDIME <=1 SENSITIVE Sensitive     CEFTRIAXONE <=1 SENSITIVE Sensitive     CIPROFLOXACIN <=0.25 SENSITIVE Sensitive     GENTAMICIN <=1 SENSITIVE Sensitive     IMIPENEM <=0.25 SENSITIVE Sensitive     TRIMETH/SULFA <=20 SENSITIVE Sensitive     AMPICILLIN/SULBACTAM >=32 RESISTANT Resistant     PIP/TAZO <=4 SENSITIVE Sensitive     Extended ESBL NEGATIVE Sensitive     * ESCHERICHIA COLI  Blood Culture ID Panel (Reflexed)     Status: Abnormal   Collection Time: 03/02/16  5:30 PM  Result Value Ref Range Status   Enterococcus species NOT DETECTED NOT DETECTED Final   Listeria monocytogenes NOT DETECTED NOT DETECTED Final   Staphylococcus species NOT DETECTED NOT DETECTED Final   Staphylococcus aureus NOT DETECTED NOT DETECTED Final   Streptococcus species NOT DETECTED NOT DETECTED Final   Streptococcus agalactiae NOT DETECTED NOT DETECTED Final   Streptococcus pneumoniae NOT DETECTED NOT DETECTED Final   Streptococcus pyogenes NOT DETECTED NOT   DETECTED Final   Acinetobacter baumannii NOT DETECTED NOT DETECTED Final   Enterobacteriaceae species DETECTED (A) NOT DETECTED Final    Comment: Enterobacteriaceae represent a large family of gram-negative bacteria, not a single organism. CRITICAL RESULT CALLED TO, READ BACK BY AND VERIFIED WITH: J ARMINGER,PHARMD AT 0716 03/03/16 BY L  BENFIELD    Enterobacter cloacae complex NOT DETECTED NOT DETECTED Final   Escherichia coli DETECTED (A) NOT DETECTED Final    Comment: CRITICAL RESULT CALLED TO, READ BACK BY AND VERIFIED WITH: J ARMINGER,PHARMD AT 0716 03/03/16 BY L BENFIELD    Klebsiella oxytoca NOT DETECTED NOT DETECTED Final   Klebsiella pneumoniae NOT DETECTED NOT DETECTED Final   Proteus species NOT DETECTED NOT DETECTED Final   Serratia marcescens NOT DETECTED NOT DETECTED Final   Carbapenem resistance NOT DETECTED NOT DETECTED Final   Haemophilus influenzae NOT DETECTED NOT DETECTED Final   Neisseria meningitidis NOT DETECTED NOT DETECTED Final   Pseudomonas aeruginosa NOT DETECTED NOT DETECTED Final   Candida albicans NOT DETECTED NOT DETECTED Final   Candida glabrata NOT DETECTED NOT DETECTED Final   Candida krusei NOT DETECTED NOT DETECTED Final   Candida parapsilosis NOT DETECTED NOT DETECTED Final   Candida tropicalis NOT DETECTED NOT DETECTED Final  Culture, blood (routine x 2)     Status: Abnormal (Preliminary result)   Collection Time: 03/06/16  8:56 AM  Result Value Ref Range Status   Specimen Description BLOOD BLOOD LEFT HAND  Final   Special Requests IN PEDIATRIC BOTTLE 3CC  Final   Culture  Setup Time   Final    GRAM POSITIVE COCCI IN CLUSTERS IN PEDIATRIC BOTTLE CRITICAL RESULT CALLED TO, READ BACK BY AND VERIFIED WITH: PHARMD T DANGE [id number] MLM    Culture STAPHYLOCOCCUS SPECIES (COAGULASE NEGATIVE) (A)  Final   Report Status PENDING  Incomplete  Culture, blood (routine x 2)     Status: None (Preliminary result)   Collection Time: 03/06/16  8:56 AM  Result Value Ref Range Status   Specimen Description BLOOD LEFT HAND  Final   Special Requests IN PEDIATRIC BOTTLE 3CC  Final   Culture NO GROWTH 1 DAY  Final   Report Status PENDING  Incomplete  Blood Culture ID Panel (Reflexed)     Status: Abnormal   Collection Time: 03/06/16  8:56 AM  Result Value Ref Range Status   Enterococcus  species NOT DETECTED NOT DETECTED Final   Listeria monocytogenes NOT DETECTED NOT DETECTED Final   Staphylococcus species DETECTED (A) NOT DETECTED Final    Comment: Methicillin (oxacillin) resistant coagulase negative staphylococcus. Possible blood culture contaminant (unless isolated from more than one blood culture draw or clinical case suggests pathogenicity). No antibiotic treatment is indicated for blood  culture contaminants. CRITICAL RESULT CALLED TO, READ BACK BY AND VERIFIED WITH: PHARMD T DANGE [id number] 0914 MLM    Staphylococcus aureus NOT DETECTED NOT DETECTED Final   Methicillin resistance DETECTED (A) NOT DETECTED Final    Comment: CRITICAL RESULT CALLED TO, READ BACK BY AND VERIFIED WITH: PHARMD T DANGE [id number] 0914 MLM    Streptococcus species NOT DETECTED NOT DETECTED Final   Streptococcus agalactiae NOT DETECTED NOT DETECTED Final   Streptococcus pneumoniae NOT DETECTED NOT DETECTED Final   Streptococcus pyogenes NOT DETECTED NOT DETECTED Final   Acinetobacter baumannii NOT DETECTED NOT DETECTED Final   Enterobacteriaceae species NOT DETECTED NOT DETECTED Final   Enterobacter cloacae complex NOT DETECTED NOT DETECTED Final   Escherichia coli NOT DETECTED NOT DETECTED   Final   Klebsiella oxytoca NOT DETECTED NOT DETECTED Final   Klebsiella pneumoniae NOT DETECTED NOT DETECTED Final   Proteus species NOT DETECTED NOT DETECTED Final   Serratia marcescens NOT DETECTED NOT DETECTED Final   Haemophilus influenzae NOT DETECTED NOT DETECTED Final   Neisseria meningitidis NOT DETECTED NOT DETECTED Final   Pseudomonas aeruginosa NOT DETECTED NOT DETECTED Final   Candida albicans NOT DETECTED NOT DETECTED Final   Candida glabrata NOT DETECTED NOT DETECTED Final   Candida krusei NOT DETECTED NOT DETECTED Final   Candida parapsilosis NOT DETECTED NOT DETECTED Final   Candida tropicalis NOT DETECTED NOT DETECTED Final  MRSA PCR Screening     Status: None   Collection Time: 03/07/16   8:08 AM  Result Value Ref Range Status   MRSA by PCR NEGATIVE NEGATIVE Final    Comment:        The GeneXpert MRSA Assay (FDA approved for NASAL specimens only), is one component of a comprehensive MRSA colonization surveillance program. It is not intended to diagnose MRSA infection nor to guide or monitor treatment for MRSA infections.      Labs: Basic Metabolic Panel:  Recent Labs Lab 03/02/16 1531 03/02/16 2211 03/04/16 1100 03/05/16 0202 03/07/16 0336 03/08/16 0426  NA 137  --  134* 134* 135 135  K 4.9  --  3.1* 3.5 3.6 3.6  CL 104  --  106 106 104 104  CO2 23  --  18* 17* 20* 21*  GLUCOSE 126*  --  120* 100* 91 98  BUN 24*  --  27* 21* 14 14  CREATININE 1.44* 1.90* 1.65* 1.44* 1.10 1.10  CALCIUM 8.8*  --  7.2* 7.8* 8.1* 8.1*  MG  --   --   --   --   --  1.7   Liver Function Tests:  Recent Labs Lab 03/02/16 2211  AST 26  ALT 13*  ALKPHOS 60  BILITOT 1.2  PROT 5.9*  ALBUMIN 2.6*   No results for input(s): LIPASE, AMYLASE in the last 168 hours. No results for input(s): AMMONIA in the last 168 hours. CBC:  Recent Labs Lab 03/02/16 1531 03/02/16 2211 03/04/16 1100 03/05/16 0202 03/07/16 0336 03/08/16 0426  WBC 5.5 14.7* 16.4* 13.8* 4.5 4.5  NEUTROABS 4.7  --   --   --  2.7  --   HGB 14.1 12.2* 10.4* 10.6* 10.3* 10.5*  HCT 42.4 36.3* 31.1* 32.5* 31.8* 32.7*  MCV 85.0 83.8 83.8 83.8 83.7 83.4  PLT 157 128* 96* 99* 106* 132*   Cardiac Enzymes:  Recent Labs Lab 03/02/16 2211 03/03/16 0235 03/03/16 0820 03/07/16 0336  TROPONINI 0.46* 0.70* 1.12* 0.49*   BNP: BNP (last 3 results) No results for input(s): BNP in the last 8760 hours.  ProBNP (last 3 results) No results for input(s): PROBNP in the last 8760 hours.  CBG:  Recent Labs Lab 03/07/16 1615 03/07/16 1941 03/08/16 0023 03/08/16 0510 03/08/16 0850  GLUCAP 105* 134* 100* 95 105*       Signed:  Xu,Fang MD, PhD  [facility] 03/08/2016, 9:57 AM    

## 2016-03-08 NOTE — Clinical Social Work Note (Signed)
Transportation resources including SCAT application provided to the patient.   Roddie McBryant Ryett Hamman MSW, [place]ame[place]CSW, [place], [phone]

## 2016-03-09 LAB — CULTURE, BLOOD (ROUTINE X 2)

## 2016-03-11 ENCOUNTER — Telehealth: Payer: Self-pay

## 2016-03-11 LAB — CULTURE, BLOOD (ROUTINE X 2): Culture: NO GROWTH

## 2016-03-11 NOTE — Telephone Encounter (Signed)
Transitional Care Clinic Post-discharge Follow-Up Phone Call:  Date of Discharge: 03/08/2016 Principal Discharge Diagnosis(es): sepsis, HTN,  DM -2, CAD Post-discharge Communication: (Clearly document all attempts clearly and date contact made) call placed to the patient Call Completed: Yes                    With Whom: Patient Interpreter Needed: No     Please check all that apply:  X Patient is knowledgeable of his/her condition(s) and/or treatment. X Patient is caring for self at home.  - lives with his wife and son ? Patient is receiving assist at home from family and/or caregiver. Family and/or caregiver is knowledgeable of patient's condition(s) and/or treatment. ? Patient is receiving home health services. If so, name of agency.     Medication Reconciliation:  ? Medication list reviewed with patient. X  Patient obtained all discharge medications. If not, why? No, he has not obtained any medication and does not have any medication at home from prior to his hospitalization. He said that he doesn't have a car and does not have anyone who can take him to the pharmacy. He also noted that he does not have insurance coverage for medications and has difficulty affording them. He said that he would get them tomorrow when [facility].   He said that he has applied for medicaid years ago and was denied. He now receives disability  - $672/month. Encouraged him to re-apply.  He did note that he receives food stamps - approximately $200/month.    Activities of Daily Living:  X Independent ? Needs assist (describe; ? home DME used) ? Total Care (describe, ? home DME used)   Community resources in place for patient:  X  None  ? Home Health/Home DME ? Assisted Living ? Support Group          Patient Education: Instructed him about the importance of getting his medications and compliance with the medication regime. He also stated that he does not have a glucometer.   Explained to him about the resources available at [facility] including pharmacy assistance and social work.         Questions/Concerns discussed: He said that he did not have any questions/concerns at this time and said that he is " feeling okay."   Confirmed his appointment for tomorrow, 03/12/16 @ 0945. He said that he would need cab transportation to the clinic. He stated that he took a cab home from the hospital.  Rosa SanchezGenova, [facility] scheduler notified of the patient's need for a cab. The patient stated that he would bring his SCAT application to his appointment tomorrow.   Message sent to Dr Venetia NightAmao notifying her that he has no medications and will need [facility] tomorrow when he is at his appointment.

## 2016-03-12 ENCOUNTER — Encounter: Payer: Self-pay | Admitting: Family Medicine

## 2016-03-12 ENCOUNTER — Ambulatory Visit: Payer: Medicare Other | Attending: Family Medicine | Admitting: Family Medicine

## 2016-03-12 ENCOUNTER — Telehealth: Payer: Self-pay

## 2016-03-12 ENCOUNTER — Encounter: Payer: Self-pay | Admitting: Licensed Clinical Social Worker

## 2016-03-12 VITALS — BP 174/111 | HR 98 | Temp 98.4°F | Ht 73.0 in | Wt 206.6 lb

## 2016-03-12 DIAGNOSIS — N3000 Acute cystitis without hematuria: Secondary | ICD-10-CM

## 2016-03-12 DIAGNOSIS — I161 Hypertensive emergency: Secondary | ICD-10-CM

## 2016-03-12 DIAGNOSIS — E119 Type 2 diabetes mellitus without complications: Secondary | ICD-10-CM | POA: Diagnosis not present

## 2016-03-12 DIAGNOSIS — Z7982 Long term (current) use of aspirin: Secondary | ICD-10-CM | POA: Diagnosis not present

## 2016-03-12 DIAGNOSIS — Z955 Presence of coronary angioplasty implant and graft: Secondary | ICD-10-CM | POA: Insufficient documentation

## 2016-03-12 DIAGNOSIS — I10 Essential (primary) hypertension: Secondary | ICD-10-CM | POA: Insufficient documentation

## 2016-03-12 DIAGNOSIS — Z7902 Long term (current) use of antithrombotics/antiplatelets: Secondary | ICD-10-CM | POA: Insufficient documentation

## 2016-03-12 DIAGNOSIS — I2510 Atherosclerotic heart disease of native coronary artery without angina pectoris: Secondary | ICD-10-CM | POA: Diagnosis not present

## 2016-03-12 DIAGNOSIS — Z951 Presence of aortocoronary bypass graft: Secondary | ICD-10-CM | POA: Diagnosis not present

## 2016-03-12 DIAGNOSIS — Z7984 Long term (current) use of oral hypoglycemic drugs: Secondary | ICD-10-CM | POA: Insufficient documentation

## 2016-03-12 DIAGNOSIS — Z79899 Other long term (current) drug therapy: Secondary | ICD-10-CM | POA: Insufficient documentation

## 2016-03-12 LAB — GLUCOSE, POCT (MANUAL RESULT ENTRY): POC GLUCOSE: 124 mg/dL — AB (ref 70–99)

## 2016-03-12 MED ORDER — CLOPIDOGREL BISULFATE 75 MG PO TABS: 75.0000 mg | ORAL_TABLET | Freq: Every day | ORAL | 3 refills | Status: DC

## 2016-03-12 MED ORDER — METFORMIN HCL 500 MG PO TABS: 500.0000 mg | ORAL_TABLET | Freq: Two times a day (BID) | ORAL | 3 refills | Status: DC

## 2016-03-12 MED ORDER — ATORVASTATIN CALCIUM 80 MG PO TABS: 80.0000 mg | ORAL_TABLET | Freq: Every day | ORAL | 3 refills | Status: DC

## 2016-03-12 MED ORDER — CARVEDILOL 25 MG PO TABS: 25.0000 mg | ORAL_TABLET | Freq: Two times a day (BID) | ORAL | 3 refills | Status: DC

## 2016-03-12 MED ORDER — CIPROFLOXACIN HCL 500 MG PO TABS: 500.0000 mg | ORAL_TABLET | Freq: Two times a day (BID) | ORAL | 0 refills | Status: AC

## 2016-03-12 MED ORDER — ASPIRIN 81 MG PO TBEC: 81.0000 mg | DELAYED_RELEASE_TABLET | Freq: Every day | ORAL | 3 refills | Status: DC

## 2016-03-12 MED ORDER — LOSARTAN POTASSIUM 25 MG PO TABS: 25.0000 mg | ORAL_TABLET | Freq: Every day | ORAL | 3 refills | Status: DC

## 2016-03-12 MED ORDER — CLONIDINE HCL 0.1 MG PO TABS
0.2000 mg | ORAL_TABLET | Freq: Every day | ORAL | Status: DC
  Administered 2016-03-12: 0.2 mg via ORAL

## 2016-03-12 MED ORDER — ISOSORBIDE MONONITRATE ER 60 MG PO TB24: 60.0000 mg | ORAL_TABLET | Freq: Every day | ORAL | 3 refills | Status: DC

## 2016-03-12 MED ORDER — AMLODIPINE BESYLATE 10 MG PO TABS: 10.0000 mg | ORAL_TABLET | Freq: Every day | ORAL | 3 refills | Status: DC

## 2016-03-12 MED FILL — CLOPIDOGREL 75 MG TABLET: 75 | 30 days supply | Qty: 30 | Fill #0

## 2016-03-12 MED FILL — ?METFORMIN HCL 500MG TABLET: 500 | 30 days supply | Qty: 60 | Fill #0

## 2016-03-12 MED FILL — CARVEDILOL 25 MG TABLET: 25 | 30 days supply | Qty: 60 | Fill #0

## 2016-03-12 MED FILL — AMLODIPINE BESYLATE 10 MG T: 10 | 30 days supply | Qty: 30 | Fill #0

## 2016-03-12 MED FILL — LOSARTAN POTASSIUM 25 MG TA: 25 | 30 days supply | Qty: 30 | Fill #0

## 2016-03-12 MED FILL — ATORVASTATIN 80 MG TABLET: 80 | 30 days supply | Qty: 30 | Fill #0

## 2016-03-12 NOTE — Telephone Encounter (Signed)
Met with the patient when he was at [facility] today. Assisted him with completing the SCAT application. He stated that he has a 6th grade education and  limited reading and writing skills. When questioned about medication management, he said that his wife takes care of that. She is a high school graduate and is able to read the medication bottles/instructions.  This CM explained that home health services might be helpful to insure proper medication adherence but he refused to have anyone come to his house. Also explained that the [facility] has a pharmacist who can meet with he/his wife to provide additional support for medication management but he was not interested at this time and said again that his wife will manage the medications.  He does not have any drug coverage with his insurance. Stressed the importance of going to DSS to apply for medicaid.  He said that he had medicaid years ago. He noted that he can have a friend of his son take them to [place] office. He also said that his wife needs to apply.   He is living with his wife and 37 yo son. He said that his son is not working at this time and has no income. He also stated that he has a daughter who lives in [place] who is helping them pay the electric bill. The patient receives food stamps and his wife does the grocery shopping.    He was introduced to [employer], LCSW .  No other questions reported at this time.

## 2016-03-13 NOTE — Progress Notes (Signed)
Transitional care clinic line  Date of telephone encounter: 03/11/16  Hospitalization dates: 03/02/16 through 03/08/16  Subjective:  Patient ID: Darrell [name], male    DOB: 08/04/1955  Age: 61 y.o. MRN: [id number]  CC: Hospitalization Follow-up; Blood Infection; Hypertension; and Diabetes   HPI Darrell [name] is a 61 year old male with a history of coronary artery disease (status post CABG in 2009, status post stent in 03/2015) with type 2 diabetes mellitus (A1c 5.2, hypertension who presents to establish care after hospitalization at [facility] for Urosepsis.  He had presented with chest pains and had been out of his medications (was admitted for NSTEMI 2 months ago), was found to be tachycardic and febrile to a temperature of 104. Labs a positive for leukocytosis, chest x-ray was normal, urinalysis was positive for leukocytes and bacteria, troponin was 0.01. Placed on IV antibiotics and admitted for sepsis.  He did have hypertensive urgency and antihypertensives were restarted;l seen by cardiology and medical management recommended. 2-D echo reveals EF of 50-55%, inferior hypokinesis, grade 1 diastolic dysfunction, , sclerosis without stenosis of the aortic valve, severely dilated left atrium, trivial tricuspid regurg. He was subsequently discharged on ciprofloxacin.  He presents today not having picked up any of his medications which he was discharged on due to financial constraints and his blood pressures and has limited range. His medications have been sent to the pharmacy site which will be cost effective for him. He does not have any upcoming appointment with cardiology-denies chest pains or shortness of breath.  Past Medical History:  Diagnosis Date  . Claudication ([place])   . Coronary artery disease    a. 08/2007 s/p DES to RCA/RI;  b. 07/2006 s/p DES to p/d LCX;  c. 11/2007 CABGx5 (LIMA->LAD, VG->D1, LRA->OM1, VG->PDA->LPL);  d. 09/2012 STEMI/Cath: 3VD, LIMA->LAD nl (LAD  95d), RA->OM nl, VG->PDA->LPL 100, unable to locate VG->Diag, EF 60-65%->Med Rx. e. 03/2015: Synergy DES to mid-Cx in the setting of NSTEMI; f. 11/2015 NSTEMI->Med Rx.  . Diabetes mellitus without complication ([place])    a. A1c 7.0 09/2012.  . Diastolic dysfunction    a. 02/2016 Echo: EF 50-55%, gr1DD, Ao sclerosis, dil Ao root (10m), sev dil LA, triv TR, PASP 224mg.  . Marland Kitchenyperlipidemia   . Hypertension   . Noncompliance     Past Surgical History:  Procedure Laterality Date  . CARDIAC CATHETERIZATION N/A 04/06/2015   Procedure: Left Heart Cath and Cors/Grafts Angiography;  Surgeon: JoLorretta HarpMD;  [facility];  Service: Cardiovascular;  Laterality: N/A;  . CARDIAC CATHETERIZATION N/A 04/06/2015   Procedure: Coronary Stent Intervention;  Surgeon: JoLorretta HarpMD;  2.5 mm x 12 mm long Synergy DES followed by  2.5 mm x 16 mm long Synergy DES    . CORONARY ARTERY BYPASS GRAFT     2009 LIMA to LAD, SVG to Diag, SVG to PDA and PL, left radial to OM  . LEFT HEART CATH N/A 10/24/2012   Procedure: LEFT HEART CATH;  Surgeon: ThTroy SineMD;  [facility];  Service: Cardiovascular;  Laterality: N/A;    No Known Allergies   Outpatient Medications Prior to Visit  Medication Sig Dispense Refill  . glucose monitoring kit (FREESTYLE) monitoring kit 1 each by Does not apply route once. Glucometer with test strips and lancets to check blood glucose BID ac breakfast and dinner. (Patient not taking: Reported on 03/12/2016) 1 each 0  . nitroGLYCERIN (NITROSTAT) 0.4 MG SL tablet Place  1 tablet (0.4 mg total) under the tongue every 5 (five) minutes as needed for chest pain. (Patient not taking: Reported on 03/12/2016) 25 tablet 3  . amLODipine (NORVASC) 10 MG tablet Take 1 tablet (10 mg total) by mouth daily. (Patient not taking: Reported on 03/12/2016) 30 tablet 3  . aspirin EC 81 MG EC tablet Take 1 tablet (81 mg total) by mouth daily. (Patient not taking: Reported on  03/12/2016)    . atorvastatin (LIPITOR) 80 MG tablet Take 1 tablet (80 mg total) by mouth daily. (Patient not taking: Reported on 03/12/2016) 90 tablet 3  . carvedilol (COREG) 25 MG tablet Take 1 tablet (25 mg total) by mouth 2 (two) times daily with a meal. (Patient not taking: Reported on 03/12/2016) 180 tablet 3  . ciprofloxacin (CIPRO) 500 MG tablet Take 1 tablet (500 mg total) by mouth 2 (two) times daily. (Patient not taking: Reported on 03/12/2016) 16 tablet 0  . clopidogrel (PLAVIX) 75 MG tablet Take 1 tablet (75 mg total) by mouth daily. (Patient not taking: Reported on 03/12/2016) 90 tablet 3  . isosorbide mononitrate (IMDUR) 60 MG 24 hr tablet Take 1 tablet (60 mg total) by mouth daily. (Patient not taking: Reported on 03/12/2016) 30 tablet 0  . losartan (COZAAR) 25 MG tablet Take 1 tablet (25 mg total) by mouth daily. (Patient not taking: Reported on 03/12/2016) 90 tablet 3  . metFORMIN (GLUCOPHAGE) 500 MG tablet Take 1 tablet (500 mg total) by mouth 2 (two) times daily with a meal. (Patient not taking: Reported on 03/12/2016) 180 tablet 3   No facility-administered medications prior to visit.     ROS Review of Systems  Constitutional: Negative for activity change and appetite change.  HENT: Negative for sinus pressure and sore throat.   Eyes: Negative for visual disturbance.  Respiratory: Negative for cough, chest tightness and shortness of breath.   Cardiovascular: Negative for chest pain and leg swelling.  Gastrointestinal: Negative for abdominal distention, abdominal pain, constipation and diarrhea.  Endocrine: Negative.   Genitourinary: Negative for dysuria.  Musculoskeletal: Negative for joint swelling and myalgias.  Skin: Negative for rash.  Allergic/Immunologic: Negative.   Neurological: Negative for weakness, light-headedness and numbness.  Psychiatric/Behavioral: Negative for dysphoric mood and suicidal ideas.    Objective:  BP (!) 174/111 (BP Location: Right Arm, Cuff  Size: Small)   Pulse 98   Temp 98.4 F (36.9 C) (Oral)   Ht '6\' 1"'$  (1.854 m)   Wt 206 lb 9.6 oz (93.7 kg)   SpO2 99%   BMI 27.26 kg/m   BP/Weight 03/12/2016 03/08/2016 81/08/2991  Systolic BP 716 967 893  Diastolic BP 810 95 88  Wt. (Lbs) 206.6 209.8 205.25  BMI 27.26 27.68 27.08      Physical Exam  Constitutional: He is oriented to person, place, and time. He appears well-developed and well-nourished.  Cardiovascular: Normal rate, normal heart sounds and intact distal pulses.   No murmur heard. Pulmonary/Chest: Effort normal and breath sounds normal. He has no wheezes. He has no rales. He exhibits no tenderness.  Healed vertical sternotomy scar  Abdominal: Soft. Bowel sounds are normal. He exhibits no distension and no mass. There is no tenderness.  Musculoskeletal: Normal range of motion.  Neurological: He is alert and oriented to person, place, and time.  Skin: Skin is warm and dry.  Psychiatric: He has a normal mood and affect.    Lab Results  Component Value Date   HGBA1C 5.2 03/02/2016    Assessment &  Plan:   1. Type 2 diabetes mellitus without complication, without long-term current use of insulin ([place]) Troubled with A1c of 5.2 - Glucose (CBG) - metFORMIN (GLUCOPHAGE) 500 MG tablet; Take 1 tablet (500 mg total) by mouth 2 (two) times daily with a meal.  Dispense: 60 tablet; Refill: 3  2. Atherosclerosis of native coronary artery of native heart without angina pectoris Status post CABG Risk factor modification - clopidogrel (PLAVIX) 75 MG tablet; Take 1 tablet (75 mg total) by mouth daily.  Dispense: 30 tablet; Refill: 3 - carvedilol (COREG) 25 MG tablet; Take 1 tablet (25 mg total) by mouth 2 (two) times daily with a meal.  Dispense: 60 tablet; Refill: 3 - atorvastatin (LIPITOR) 80 MG tablet; Take 1 tablet (80 mg total) by mouth daily.  Dispense: 90 tablet; Refill: 3 - aspirin 81 MG EC tablet; Take 1 tablet (81 mg total) by mouth daily.  Dispense: 30 tablet;  Refill: 3  3. Hypertensive emergency Uncontrolled due to running out of medications Clonidine 0.2 administered and patient reassessed after 45 minutes Refilled medications - losartan (COZAAR) 25 MG tablet; Take 1 tablet (25 mg total) by mouth daily.  Dispense: 30 tablet; Refill: 3 - isosorbide mononitrate (IMDUR) 60 MG 24 hr tablet; Take 1 tablet (60 mg total) by mouth daily.  Dispense: 30 tablet; Refill: 3 - amLODipine (NORVASC) 10 MG tablet; Take 1 tablet (10 mg total) by mouth daily.  Dispense: 30 tablet; Refill: 3 - cloNIDine (CATAPRES) tablet 0.2 mg; Take 2 tablets (0.2 mg total) by mouth daily.  4. Acute cystitis without hematuria He is asymptomatic but is yet to pick up antibiotic on which he was discharged Refilled antibiotics - ciprofloxacin (CIPRO) 500 MG tablet; Take 1 tablet (500 mg total) by mouth 2 (two) times daily.  Dispense: 16 tablet; Refill: 0   Meds ordered this encounter  Medications  . metFORMIN (GLUCOPHAGE) 500 MG tablet    Sig: Take 1 tablet (500 mg total) by mouth 2 (two) times daily with a meal.    Dispense:  60 tablet    Refill:  3  . losartan (COZAAR) 25 MG tablet    Sig: Take 1 tablet (25 mg total) by mouth daily.    Dispense:  30 tablet    Refill:  3  . isosorbide mononitrate (IMDUR) 60 MG 24 hr tablet    Sig: Take 1 tablet (60 mg total) by mouth daily.    Dispense:  30 tablet    Refill:  3  . clopidogrel (PLAVIX) 75 MG tablet    Sig: Take 1 tablet (75 mg total) by mouth daily.    Dispense:  30 tablet    Refill:  3  . ciprofloxacin (CIPRO) 500 MG tablet    Sig: Take 1 tablet (500 mg total) by mouth 2 (two) times daily.    Dispense:  16 tablet    Refill:  0  . carvedilol (COREG) 25 MG tablet    Sig: Take 1 tablet (25 mg total) by mouth 2 (two) times daily with a meal.    Dispense:  60 tablet    Refill:  3  . atorvastatin (LIPITOR) 80 MG tablet    Sig: Take 1 tablet (80 mg total) by mouth daily.    Dispense:  90 tablet    Refill:  3  .  amLODipine (NORVASC) 10 MG tablet    Sig: Take 1 tablet (10 mg total) by mouth daily.    Dispense:  30 tablet  Refill:  3  . aspirin 81 MG EC tablet    Sig: Take 1 tablet (81 mg total) by mouth daily.    Dispense:  30 tablet    Refill:  3  . cloNIDine (CATAPRES) tablet 0.2 mg    Follow-up: Return in about 2 weeks (around 03/26/2016) for TCC - follow up on Hypertension.   Arnoldo Morale MD

## 2016-03-14 NOTE — Progress Notes (Signed)
LCSWA introduced [facility] and [facility]. LCSWA inquired about hx of a mental health diagnosis. Pt denied having any depression or anxiety.   Pt disclosed that he resides with his wife and adult son. He stated that extended family have all passed away; however, they receive transportation assistance from son's friend. RN CM Robyne PeersJane Brazeau discussed benefits of [employer] transportation and provided assistance to pt in completion of application.   Pt denied need for any community resources. LCSWA encouraged pt to contact her should a need arise.

## 2016-03-18 ENCOUNTER — Telehealth: Payer: Self-pay

## 2016-03-18 NOTE — Telephone Encounter (Signed)
Completed [facility] application faxed to [facility] eligibility staff. Fax # [phone]

## 2016-03-25 ENCOUNTER — Telehealth: Payer: Self-pay

## 2016-03-25 NOTE — Telephone Encounter (Signed)
Call placed to the patient to check on his status and to remind him of [facility].  He stated that he is doing " pretty good" and has all of his medications and has been taking them as ordered.  He confirmed his appointment for tomorrow but noted that he will need transportation to the clinic.  Informed him that a cab can be ordered for him and he was in agreement and confirmed that he is at the address in [place]. Message sent to MartinezGenova, [facility] scheduler to notify her of the patient's need for a cab.  Requested that he bring all of his medications with him to his appointment and he stated that he would. The patient stated that he has an appointment with [facility] for an assessment on 03/27/16 @ 1130.  No other concerns/questions reported at this time.

## 2016-03-26 ENCOUNTER — Ambulatory Visit: Payer: Medicare Other | Attending: Family Medicine | Admitting: Family Medicine

## 2016-03-26 ENCOUNTER — Encounter: Payer: Self-pay | Admitting: Family Medicine

## 2016-03-26 VITALS — BP 125/78 | HR 81 | Temp 98.2°F | Ht 73.0 in | Wt 204.4 lb

## 2016-03-26 DIAGNOSIS — I2510 Atherosclerotic heart disease of native coronary artery without angina pectoris: Secondary | ICD-10-CM | POA: Diagnosis not present

## 2016-03-26 DIAGNOSIS — Z7902 Long term (current) use of antithrombotics/antiplatelets: Secondary | ICD-10-CM | POA: Diagnosis not present

## 2016-03-26 DIAGNOSIS — Z79899 Other long term (current) drug therapy: Secondary | ICD-10-CM | POA: Diagnosis not present

## 2016-03-26 DIAGNOSIS — E119 Type 2 diabetes mellitus without complications: Secondary | ICD-10-CM | POA: Insufficient documentation

## 2016-03-26 DIAGNOSIS — Z951 Presence of aortocoronary bypass graft: Secondary | ICD-10-CM | POA: Diagnosis not present

## 2016-03-26 DIAGNOSIS — Z7984 Long term (current) use of oral hypoglycemic drugs: Secondary | ICD-10-CM | POA: Insufficient documentation

## 2016-03-26 DIAGNOSIS — I10 Essential (primary) hypertension: Secondary | ICD-10-CM | POA: Diagnosis not present

## 2016-03-26 DIAGNOSIS — Z7982 Long term (current) use of aspirin: Secondary | ICD-10-CM | POA: Insufficient documentation

## 2016-03-26 LAB — BASIC METABOLIC PANEL
BUN: 23 mg/dL (ref 7–25)
CHLORIDE: 104 mmol/L (ref 98–110)
CO2: 25 mmol/L (ref 20–31)
Calcium: 8.9 mg/dL (ref 8.6–10.3)
Creat: 1.18 mg/dL (ref 0.70–1.33)
Glucose, Bld: 97 mg/dL (ref 65–99)
Potassium: 4.7 mmol/L (ref 3.5–5.3)
Sodium: 136 mmol/L (ref 135–146)

## 2016-03-26 LAB — GLUCOSE, POCT (MANUAL RESULT ENTRY): POC Glucose: 87 mg/dl (ref 70–99)

## 2016-03-26 NOTE — Progress Notes (Signed)
Transitional care clinic line  Date of telephone encounter: 03/11/16  Hospitalization dates: 03/02/16 through 03/08/16  Subjective:    Patient ID: Darrell [name], male    DOB: 29-Sep-1955, 61 y.o.   MRN: [id number]  HPI Darrell [name] is a 61 year old male with a history of coronary artery disease (status post CABG in 2009, status post stent in 03/2015) with type 2 diabetes mellitus (A1c 5.2, hypertension who presents For a follow up visit. At his last visit he was yet to pick up all his antihypertensive and blood pressure wasn't accelerated range but at this time he has always medications with him.  He was recently hospitalized for urosepsis had presented with chest pains and had been out of his medications (was admitted for NSTEMI 2 months ago), was found to be tachycardic and febrile to a temperature of 104. Labs a positive for leukocytosis, chest x-ray was normal, urinalysis was positive for leukocytes and bacteria, troponin was 0.01. Placed on IV antibiotics and admitted for sepsis.  He did have hypertensive urgency and antihypertensives were restarted; seen by cardiology and medical management recommended. 2-D echo reveals EF of 50-55%, inferior hypokinesis, grade 1 diastolic dysfunction, , sclerosis without stenosis of the aortic valve, severely dilated left atrium, trivial tricuspid regurg. He was subsequently discharged on ciprofloxacin.    He does not have any upcoming appointment with cardiology-denies chest pains or shortness of breath, has no urinary symptoms and reports doing well.   Past Medical History:  Diagnosis Date  . Claudication ([place])   . Coronary artery disease    a. 08/2007 s/p DES to RCA/RI;  b. 07/2006 s/p DES to p/d LCX;  c. 11/2007 CABGx5 (LIMA->LAD, VG->D1, LRA->OM1, VG->PDA->LPL);  d. 09/2012 STEMI/Cath: 3VD, LIMA->LAD nl (LAD 95d), RA->OM nl, VG->PDA->LPL 100, unable to locate VG->Diag, EF 60-65%->Med Rx. e. 03/2015: Synergy DES to mid-Cx in the setting of  NSTEMI; f. 11/2015 NSTEMI->Med Rx.  . Diabetes mellitus without complication ([place])    a. A1c 7.0 09/2012.  . Diastolic dysfunction    a. 02/2016 Echo: EF 50-55%, gr1DD, Ao sclerosis, dil Ao root (72m), sev dil LA, triv TR, PASP 231mg.  . Marland Kitchenyperlipidemia   . Hypertension   . Noncompliance     Past Surgical History:  Procedure Laterality Date  . CARDIAC CATHETERIZATION N/A 04/06/2015   Procedure: Left Heart Cath and Cors/Grafts Angiography;  Surgeon: JoLorretta HarpMD;  [facility];  Service: Cardiovascular;  Laterality: N/A;  . CARDIAC CATHETERIZATION N/A 04/06/2015   Procedure: Coronary Stent Intervention;  Surgeon: JoLorretta HarpMD;  2.5 mm x 12 mm long Synergy DES followed by  2.5 mm x 16 mm long Synergy DES    . CORONARY ARTERY BYPASS GRAFT     2009 LIMA to LAD, SVG to Diag, SVG to PDA and PL, left radial to OM  . LEFT HEART CATH N/A 10/24/2012   Procedure: LEFT HEART CATH;  Surgeon: ThTroy SineMD;  [facility];  Service: Cardiovascular;  Laterality: N/A;    No Known Allergies  Current Outpatient Prescriptions on File Prior to Visit  Medication Sig Dispense Refill  . amLODipine (NORVASC) 10 MG tablet Take 1 tablet (10 mg total) by mouth daily. 30 tablet 3  . aspirin 81 MG EC tablet Take 1 tablet (81 mg total) by mouth daily. 30 tablet 3  . atorvastatin (LIPITOR) 80 MG tablet Take 1 tablet (80 mg total) by mouth daily. 90 tablet 3  . carvedilol (  COREG) 25 MG tablet Take 1 tablet (25 mg total) by mouth 2 (two) times daily with a meal. 60 tablet 3  . clopidogrel (PLAVIX) 75 MG tablet Take 1 tablet (75 mg total) by mouth daily. 30 tablet 3  . isosorbide mononitrate (IMDUR) 60 MG 24 hr tablet Take 1 tablet (60 mg total) by mouth daily. 30 tablet 3  . losartan (COZAAR) 25 MG tablet Take 1 tablet (25 mg total) by mouth daily. 30 tablet 3  . metFORMIN (GLUCOPHAGE) 500 MG tablet Take 1 tablet (500 mg total) by mouth 2 (two) times daily with a meal. 60  tablet 3  . glucose monitoring kit (FREESTYLE) monitoring kit 1 each by Does not apply route once. Glucometer with test strips and lancets to check blood glucose BID ac breakfast and dinner. (Patient not taking: Reported on 03/12/2016) 1 each 0  . nitroGLYCERIN (NITROSTAT) 0.4 MG SL tablet Place 1 tablet (0.4 mg total) under the tongue every 5 (five) minutes as needed for chest pain. (Patient not taking: Reported on 03/12/2016) 25 tablet 3   No current facility-administered medications on file prior to visit.      Review of Systems Constitutional: Negative for activity change and appetite change.  HENT: Negative for sinus pressure and sore throat.   Eyes: Negative for visual disturbance.  Respiratory: Negative for cough, chest tightness and shortness of breath.   Cardiovascular: Negative for chest pain and leg swelling.  Gastrointestinal: Negative for abdominal distention, abdominal pain, constipation and diarrhea.  Endocrine: Negative.   Genitourinary: Negative for dysuria.  Musculoskeletal: Negative for joint swelling and myalgias.  Skin: Negative for rash.  Allergic/Immunologic: Negative.   Neurological: Negative for weakness, light-headedness and numbness.  Psychiatric/Behavioral: Negative for dysphoric mood and suicidal ideas.      Objective: Vitals:   03/26/16 1433  BP: 125/78  Pulse: 81  Temp: 98.2 F (36.8 C)  TempSrc: Oral  SpO2: 98%  Weight: 204 lb 6.4 oz (92.7 kg)  Height: '6\' 1"'$  (1.854 m)      Physical Exam Constitutional: He is oriented to person, place, and time. He appears well-developed and well-nourished.  Cardiovascular: Normal rate, normal heart sounds and intact distal pulses.   No murmur heard. Pulmonary/Chest: Effort normal and breath sounds normal. He has no wheezes. He has no rales. He exhibits no tenderness.  Healed vertical sternotomy scar  Abdominal: Soft. Bowel sounds are normal. He exhibits no distension and no mass. There is no tenderness.    Musculoskeletal: Normal range of motion.  Neurological: He is alert and oriented to person, place, and time.  Skin: Skin is warm and dry.  Psychiatric: He has a normal mood and affect.         Assessment & Plan:  1. Type 2 diabetes mellitus without complication, without long-term current use of insulin ([place]) Troubled with A1c of 5.2 - Glucose (CBG) - metFORMIN (GLUCOPHAGE) 500 MG tablet; Take 1 tablet (500 mg total) by mouth 2 (two) times daily with a meal.  Dispense: 60 tablet; Refill: 3  2. Atherosclerosis of native coronary artery of native heart without angina pectoris Status post CABG Risk factor modification - clopidogrel (PLAVIX) 75 MG tablet; Take 1 tablet (75 mg total) by mouth daily.  Dispense: 30 tablet; Refill: 3 - carvedilol (COREG) 25 MG tablet; Take 1 tablet (25 mg total) by mouth 2 (two) times daily with a meal.  Dispense: 60 tablet; Refill: 3 - atorvastatin (LIPITOR) 80 MG tablet; Take 1 tablet (80 mg total) by  mouth daily.  Dispense: 90 tablet; Refill: 3 - aspirin 81 MG EC tablet; Take 1 tablet (81 mg total) by mouth daily.  Dispense: 30 tablet; Refill: 3  3. Essential hypertension Controlled Continue low-sodium diet and medications - losartan (COZAAR) 25 MG tablet; Take 1 tablet (25 mg total) by mouth daily.  Dispense: 30 tablet; Refill: 3 - isosorbide mononitrate (IMDUR) 60 MG 24 hr tablet; Take 1 tablet (60 mg total) by mouth daily.  Dispense: 30 tablet; Refill: 3 - amLODipine (NORVASC) 10 MG tablet; Take 1 tablet (10 mg total) by mouth daily.  Dispense: 30 tablet; Refill: 3

## 2016-03-28 ENCOUNTER — Telehealth: Payer: Self-pay

## 2016-03-28 NOTE — Telephone Encounter (Signed)
Writer called patient and discussed lab results.  Patient stated understanding. 

## 2016-03-28 NOTE — Telephone Encounter (Signed)
-----   Message from Jaclyn ShaggyEnobong Amao, MD sent at 03/27/2016 10:16 AM EST ----- Please inform the patient that labs are normal. Thank you.

## 2016-04-09 ENCOUNTER — Telehealth: Payer: Self-pay

## 2016-04-09 NOTE — Telephone Encounter (Signed)
Call placed to the patient to check on his status. He said that he is " doing pretty good."  No  concerns/questions to report. He noted that he has all of his medications and is taking them as ordered. He said that his wife has been managing his medications and [facility] when needed.  He reported that he has been approved for [employer] and will schedule a ride with [employer] to take him to DSS to apply for medicaid.  Next appointment with Dr Venetia NightAmao 06/25/16 @ 1330.

## 2016-04-10 MED FILL — ATORVASTATIN 80 MG TABLET: 80 | 30 days supply | Qty: 30 | Fill #1

## 2016-04-10 MED FILL — LOSARTAN POTASSIUM 25 MG TA: 25 | 30 days supply | Qty: 30 | Fill #1

## 2016-04-10 MED FILL — CLOPIDOGREL 75 MG TABLET: 75 | 30 days supply | Qty: 30 | Fill #1

## 2016-04-10 MED FILL — AMLODIPINE BESYLATE 10 MG T: 10 | 30 days supply | Qty: 30 | Fill #1

## 2016-04-10 MED FILL — ?CARVEDILOL 25 MG TABLET: 25 | 30 days supply | Qty: 60 | Fill #1

## 2016-04-10 MED FILL — ?METFORMIN HCL 500MG TABLET: 500 | 30 days supply | Qty: 60 | Fill #1

## 2016-05-16 MED FILL — ?METFORMIN HCL 500MG TABLET: 500 | 30 days supply | Qty: 60 | Fill #2

## 2016-05-16 MED FILL — ATORVASTATIN 80 MG TABLET: 80 | 30 days supply | Qty: 30 | Fill #2

## 2016-05-16 MED FILL — CARVEDILOL 25 MG TABLET: 25 | 30 days supply | Qty: 60 | Fill #2

## 2016-05-16 MED FILL — AMLODIPINE BESYLATE 10 MG T: 10 | 30 days supply | Qty: 30 | Fill #2

## 2016-05-16 MED FILL — CLOPIDOGREL 75 MG TABLET: 75 | 30 days supply | Qty: 30 | Fill #2

## 2016-05-16 MED FILL — LOSARTAN POTASSIUM 25 MG TA: 25 | 30 days supply | Qty: 30 | Fill #2

## 2016-06-25 ENCOUNTER — Ambulatory Visit: Payer: Medicare Other | Admitting: Family Medicine

## 2016-06-27 ENCOUNTER — Other Ambulatory Visit: Payer: Self-pay | Admitting: Family Medicine

## 2016-06-27 DIAGNOSIS — E119 Type 2 diabetes mellitus without complications: Secondary | ICD-10-CM

## 2016-06-27 DIAGNOSIS — I2510 Atherosclerotic heart disease of native coronary artery without angina pectoris: Secondary | ICD-10-CM

## 2016-06-27 DIAGNOSIS — I161 Hypertensive emergency: Secondary | ICD-10-CM

## 2016-06-27 MED FILL — CLOPIDOGREL 75 MG TABLET: 75 | 30 days supply | Qty: 30 | Fill #3

## 2016-06-27 MED FILL — CARVEDILOL 25 MG TABLET: 25 | 30 days supply | Qty: 60 | Fill #3

## 2016-06-27 MED FILL — metFORMIN HCL 500 MG TABS: 500 | 30 days supply | Qty: 60 | Fill #3

## 2016-06-27 MED FILL — AMLODIPINE BESYLATE 10 MG T: 10 | 30 days supply | Qty: 30 | Fill #3

## 2016-06-27 MED FILL — ATORVASTATIN 80 MG TABLET: 80 | 30 days supply | Qty: 30 | Fill #3

## 2016-06-27 MED FILL — LOSARTAN POTASSIUM 25 MG TA: 25 | 30 days supply | Qty: 30 | Fill #3

## 2016-07-05 ENCOUNTER — Other Ambulatory Visit: Payer: Self-pay | Admitting: Pharmacist

## 2016-07-05 ENCOUNTER — Encounter: Payer: Self-pay | Admitting: Family Medicine

## 2016-07-05 ENCOUNTER — Ambulatory Visit: Payer: Medicare Other | Attending: Family Medicine | Admitting: Family Medicine

## 2016-07-05 VITALS — BP 133/85 | HR 79 | Temp 98.3°F | Resp 16 | Ht 73.0 in | Wt 211.0 lb

## 2016-07-05 DIAGNOSIS — I252 Old myocardial infarction: Secondary | ICD-10-CM | POA: Diagnosis not present

## 2016-07-05 DIAGNOSIS — I2510 Atherosclerotic heart disease of native coronary artery without angina pectoris: Secondary | ICD-10-CM | POA: Diagnosis not present

## 2016-07-05 DIAGNOSIS — Z7984 Long term (current) use of oral hypoglycemic drugs: Secondary | ICD-10-CM | POA: Insufficient documentation

## 2016-07-05 DIAGNOSIS — Z955 Presence of coronary angioplasty implant and graft: Secondary | ICD-10-CM | POA: Insufficient documentation

## 2016-07-05 DIAGNOSIS — E119 Type 2 diabetes mellitus without complications: Secondary | ICD-10-CM | POA: Diagnosis not present

## 2016-07-05 DIAGNOSIS — Z7982 Long term (current) use of aspirin: Secondary | ICD-10-CM | POA: Diagnosis not present

## 2016-07-05 DIAGNOSIS — Z7902 Long term (current) use of antithrombotics/antiplatelets: Secondary | ICD-10-CM | POA: Insufficient documentation

## 2016-07-05 DIAGNOSIS — I10 Essential (primary) hypertension: Secondary | ICD-10-CM | POA: Insufficient documentation

## 2016-07-05 DIAGNOSIS — Z1211 Encounter for screening for malignant neoplasm of colon: Secondary | ICD-10-CM | POA: Diagnosis not present

## 2016-07-05 DIAGNOSIS — Z951 Presence of aortocoronary bypass graft: Secondary | ICD-10-CM | POA: Diagnosis not present

## 2016-07-05 DIAGNOSIS — E785 Hyperlipidemia, unspecified: Secondary | ICD-10-CM | POA: Insufficient documentation

## 2016-07-05 LAB — POCT GLYCOSYLATED HEMOGLOBIN (HGB A1C): HEMOGLOBIN A1C: 5.4

## 2016-07-05 LAB — GLUCOSE, POCT (MANUAL RESULT ENTRY): POC Glucose: 135 mg/dl — AB (ref 70–99)

## 2016-07-05 MED ORDER — CARVEDILOL 25 MG PO TABS: 25.0000 mg | ORAL_TABLET | Freq: Two times a day (BID) | ORAL | 3 refills | Status: DC

## 2016-07-05 MED ORDER — ACCU-CHEK AVIVA DEVI: 0 refills | Status: DC

## 2016-07-05 MED ORDER — TRUE METRIX METER W/DEVICE KIT: PACK | 0 refills | Status: DC

## 2016-07-05 MED ORDER — GLUCOSE BLOOD VI STRP
ORAL_STRIP | 12 refills | Status: DC
Start: 2016-07-05 — End: 2016-07-05

## 2016-07-05 MED ORDER — ISOSORBIDE MONONITRATE ER 60 MG PO TB24: 60.0000 mg | ORAL_TABLET | Freq: Every day | ORAL | 3 refills | Status: DC

## 2016-07-05 MED ORDER — METFORMIN HCL 500 MG PO TABS: 500.0000 mg | ORAL_TABLET | Freq: Two times a day (BID) | ORAL | 3 refills | Status: DC

## 2016-07-05 MED ORDER — CLOPIDOGREL BISULFATE 75 MG PO TABS: 75.0000 mg | ORAL_TABLET | Freq: Every day | ORAL | 3 refills | Status: DC

## 2016-07-05 MED ORDER — AMLODIPINE BESYLATE 10 MG PO TABS: 10.0000 mg | ORAL_TABLET | Freq: Every day | ORAL | 3 refills | Status: DC

## 2016-07-05 MED ORDER — TRUEPLUS LANCETS 28G MISC: 5 refills | Status: DC

## 2016-07-05 MED ORDER — ACCU-CHEK SOFTCLIX LANCET DEV MISC: 5 refills | Status: DC

## 2016-07-05 MED ORDER — LOSARTAN POTASSIUM 25 MG PO TABS: 25.0000 mg | ORAL_TABLET | Freq: Every day | ORAL | 3 refills | Status: DC

## 2016-07-05 MED ORDER — GLUCOSE BLOOD VI STRP: ORAL_STRIP | 12 refills | Status: DC

## 2016-07-05 NOTE — Patient Instructions (Signed)

## 2016-07-05 NOTE — Progress Notes (Signed)
Subjective:  Patient ID: Darrell [name], male    DOB: [birth year]  Age: 61 y.o. MRN: [id number]  CC: Follow-up and Diabetes   HPI Darrell [name] is a 61 year old male with a history of coronary artery disease (status post CABG in 2009, status post stent in 03/2015) with type 2 diabetes mellitus (A1c 5.4, hypertension who presents For a follow up visit.  2-D echo from 02/2016 revealed EF of 50-55%, inferior hypokinesis, grade 1 diastolic dysfunction, , sclerosis without stenosis of the aortic valve, severely dilated left atrium, trivial tricuspid regurg.   He endorses compliance with all his medications and with respect to his diabetes denies hypoglycemia, neuropathy or visual complaints. He has not been to see an ophthalmologist due to lack of medical coverage.  He does not have any upcoming appointment with cardiology - denies chest pains or shortness of breath, has no urinary symptoms and reports doing well.  Past Medical History:  Diagnosis Date  . Claudication ([place])   . Coronary artery disease    a. 08/2007 s/p DES to RCA/RI;  b. 07/2006 s/p DES to p/d LCX;  c. 11/2007 CABGx5 (LIMA->LAD, VG->D1, LRA->OM1, VG->PDA->LPL);  d. 09/2012 STEMI/Cath: 3VD, LIMA->LAD nl (LAD 95d), RA->OM nl, VG->PDA->LPL 100, unable to locate VG->Diag, EF 60-65%->Med Rx. e. 03/2015: Synergy DES to mid-Cx in the setting of NSTEMI; f. 11/2015 NSTEMI->Med Rx.  . Diabetes mellitus without complication ([place])    a. A1c 7.0 09/2012.  . Diastolic dysfunction    a. 02/2016 Echo: EF 50-55%, gr1DD, Ao sclerosis, dil Ao root (18m), sev dil LA, triv TR, PASP 259mg.  . Marland Kitchenyperlipidemia   . Hypertension   . Noncompliance     . Past Medical History:  Diagnosis Date  . Claudication ([facility]  . Coronary artery disease    a. 08/2007 s/p DES to RCA/RI;  b. 07/2006 s/p DES to p/d LCX;  c. 11/2007 CABGx5 (LIMA->LAD, VG->D1, LRA->OM1, VG->PDA->LPL);  d. 09/2012 STEMI/Cath: 3VD, LIMA->LAD nl (LAD 95d), RA->OM nl, VG->PDA->LPL 100,  unable to locate VG->Diag, EF 60-65%->Med Rx. e. 03/2015: Synergy DES to mid-Cx in the setting of NSTEMI; f. 11/2015 NSTEMI->Med Rx.  . Diabetes mellitus without complication ([facility]   a. A1c 7.0 09/2012.  . Diastolic dysfunction    a. 02/2016 Echo: EF 50-55%, gr1DD, Ao sclerosis, dil Ao root (4260m sev dil LA, triv TR, PASP 42m36m  . HyMarland Kitchenerlipidemia   . Hypertension   . Noncompliance        Outpatient Medications Prior to Visit  Medication Sig Dispense Refill  . aspirin 81 MG EC tablet Take 1 tablet (81 mg total) by mouth daily. 30 tablet 3  . atorvastatin (LIPITOR) 80 MG tablet Take 1 tablet (80 mg total) by mouth daily. 90 tablet 3  . amLODipine (NORVASC) 10 MG tablet Take 1 tablet (10 mg total) by mouth daily. 30 tablet 3  . carvedilol (COREG) 25 MG tablet Take 1 tablet (25 mg total) by mouth 2 (two) times daily with a meal. 60 tablet 3  . clopidogrel (PLAVIX) 75 MG tablet Take 1 tablet (75 mg total) by mouth daily. 30 tablet 3  . isosorbide mononitrate (IMDUR) 60 MG 24 hr tablet Take 1 tablet (60 mg total) by mouth daily. 30 tablet 3  . losartan (COZAAR) 25 MG tablet Take 1 tablet (25 mg total) by mouth daily. 30 tablet 3  . metFORMIN (GLUCOPHAGE) 500 MG tablet Take 1 tablet (500 mg total) by mouth 2 (two) times daily with a meal.  60 tablet 3  . nitroGLYCERIN (NITROSTAT) 0.4 MG SL tablet Place 1 tablet (0.4 mg total) under the tongue every 5 (five) minutes as needed for chest pain. (Patient not taking: Reported on 03/12/2016) 25 tablet 3  . glucose monitoring kit (FREESTYLE) monitoring kit 1 each by Does not apply route once. Glucometer with test strips and lancets to check blood glucose BID ac breakfast and dinner. (Patient not taking: Reported on 03/12/2016) 1 each 0   No facility-administered medications prior to visit.     ROS Review of Systems Constitutional: Negative for activity change and appetite change.  HENT: Negative for sinus pressure and sore throat.   Eyes: Negative  for visual disturbance.  Respiratory: Negative for cough, chest tightness and shortness of breath.   Cardiovascular: Negative for chest pain and leg swelling.  Gastrointestinal: Negative for abdominal distention, abdominal pain, constipation and diarrhea.  Endocrine: Negative.   Genitourinary: Negative for dysuria.  Musculoskeletal: Negative for joint swelling and myalgias.  Skin: Negative for rash.  Allergic/Immunologic: Negative.   Neurological: Negative for weakness, light-headedness and numbness.  Psychiatric/Behavioral: Negative for dysphoric mood and suicidal ideas.  Objective:  BP 133/85 (BP Location: Right Arm, Patient Position: Sitting, Cuff Size: Large)   Pulse 79   Temp 98.3 F (36.8 C) (Oral)   Resp 16   Ht '6\' 1"'$  (1.854 m)   Wt 211 lb (95.7 kg)   SpO2 99%   BMI 27.84 kg/m   BP/Weight 07/05/2016 03/26/2016 5/88/5027  Systolic BP 741 287 867  Diastolic BP 85 78 672  Wt. (Lbs) 211 204.4 206.6  BMI 27.84 26.97 27.26      Physical Exam Constitutional: He is oriented to person, place, and time. He appears well-developed and well-nourished.  Cardiovascular: Normal rate, normal heart sounds and intact distal pulses.   No murmur heard. Pulmonary/Chest: Effort normal and breath sounds normal. He has no wheezes. He has no rales. He exhibits no tenderness.  Healed vertical sternotomy scar  Abdominal: Soft. Bowel sounds are normal. He exhibits no distension and no mass. There is no tenderness.  Musculoskeletal: Normal range of motion.  Neurological: He is alert and oriented to person, place, and time.  Skin: Skin is warm and dry.  Psychiatric: He has a normal mood and affect.     CMP Latest Ref Rng & Units 03/26/2016 03/08/2016 03/07/2016  Glucose 65 - 99 mg/dL 97 98 91  BUN 7 - 25 mg/dL '23 14 14  '$ Creatinine 0.70 - 1.33 mg/dL 1.18 1.10 1.10  Sodium 135 - 146 mmol/L 136 135 135  Potassium 3.5 - 5.3 mmol/L 4.7 3.6 3.6  Chloride 98 - 110 mmol/L 104 104 104  CO2 20 - 31 mmol/L  25 21(L) 20(L)  Calcium 8.6 - 10.3 mg/dL 8.9 8.1(L) 8.1(L)  Total Protein 6.5 - 8.1 g/dL - - -  Total Bilirubin 0.3 - 1.2 mg/dL - - -  Alkaline Phos 38 - 126 U/L - - -  AST 15 - 41 U/L - - -  ALT 17 - 63 U/L - - -    Lipid Panel     Component Value Date/Time   CHOL 138 03/03/2016 0235   TRIG 48 03/03/2016 0235   HDL 28 (L) 03/03/2016 0235   CHOLHDL 4.9 03/03/2016 0235   VLDL 10 03/03/2016 0235   LDLCALC 100 (H) 03/03/2016 0235    Lab Results  Component Value Date   HGBA1C 5.4 07/05/2016    Assessment & Plan:   1. Type 2 diabetes  mellitus without complication, without long-term current use of insulin (HCC) Controlled with A1c of 5.4 Continue current regimen - POCT glycosylated hemoglobin (Hb A1C) - POCT glucose (manual entry) - metFORMIN (GLUCOPHAGE) 500 MG tablet; Take 1 tablet (500 mg total) by mouth 2 (two) times daily with a meal.  Dispense: 60 tablet; Refill: 3 - Blood Glucose Monitoring Suppl (ACCU-CHEK AVIVA) device; Use as instructed daily.  Dispense: 1 each; Refill: 0 - glucose blood (ACCU-CHEK AVIVA) test strip; Use as instructed daily  Dispense: 100 each; Refill: 12 - Lancet Devices (ACCU-CHEK SOFTCLIX) lancets; Use as instructed daily.  Dispense: 1 each; Refill: 5 - Ambulatory referral to Podiatry - Ambulatory referral to Ophthalmology - Microalbumin/Creatinine Ratio, Urine  2. Atherosclerosis of native coronary artery of native heart without angina pectoris Risk factor modification - carvedilol (COREG) 25 MG tablet; Take 1 tablet (25 mg total) by mouth 2 (two) times daily with a meal.  Dispense: 60 tablet; Refill: 3 - clopidogrel (PLAVIX) 75 MG tablet; Take 1 tablet (75 mg total) by mouth daily.  Dispense: 30 tablet; Refill: 3  3. Essential hypertension Controlled - isosorbide mononitrate (IMDUR) 60 MG 24 hr tablet; Take 1 tablet (60 mg total) by mouth daily.  Dispense: 30 tablet; Refill: 3 - amLODipine (NORVASC) 10 MG tablet; Take 1 tablet (10 mg total) by  mouth daily.  Dispense: 30 tablet; Refill: 3 - losartan (COZAAR) 25 MG tablet; Take 1 tablet (25 mg total) by mouth daily.  Dispense: 30 tablet; Refill: 3  4. Screening for colon cancer Referred to GI for colonoscopy   Meds ordered this encounter  Medications  . carvedilol (COREG) 25 MG tablet    Sig: Take 1 tablet (25 mg total) by mouth 2 (two) times daily with a meal.    Dispense:  60 tablet    Refill:  3  . clopidogrel (PLAVIX) 75 MG tablet    Sig: Take 1 tablet (75 mg total) by mouth daily.    Dispense:  30 tablet    Refill:  3  . isosorbide mononitrate (IMDUR) 60 MG 24 hr tablet    Sig: Take 1 tablet (60 mg total) by mouth daily.    Dispense:  30 tablet    Refill:  3  . amLODipine (NORVASC) 10 MG tablet    Sig: Take 1 tablet (10 mg total) by mouth daily.    Dispense:  30 tablet    Refill:  3  . losartan (COZAAR) 25 MG tablet    Sig: Take 1 tablet (25 mg total) by mouth daily.    Dispense:  30 tablet    Refill:  3  . metFORMIN (GLUCOPHAGE) 500 MG tablet    Sig: Take 1 tablet (500 mg total) by mouth 2 (two) times daily with a meal.    Dispense:  60 tablet    Refill:  3  . Blood Glucose Monitoring Suppl (ACCU-CHEK AVIVA) device    Sig: Use as instructed daily.    Dispense:  1 each    Refill:  0  . glucose blood (ACCU-CHEK AVIVA) test strip    Sig: Use as instructed daily    Dispense:  100 each    Refill:  12  . Lancet Devices (ACCU-CHEK SOFTCLIX) lancets    Sig: Use as instructed daily.    Dispense:  1 each    Refill:  5    Follow-up: Return in about 3 months (around 10/05/2016) for Follow-up of chronic medical conditions.   This note has been created  with Surveyor, quantity. Any transcriptional errors are unintentional.     Arnoldo Morale MD

## 2016-07-05 NOTE — Progress Notes (Signed)
F/U DM Not checking glucose at home Taking medication as prescribed  No pain today  No tobacco user

## 2016-07-06 LAB — MICROALBUMIN / CREATININE URINE RATIO
Creatinine, Urine: 28.4 mg/dL
MICROALBUM., U, RANDOM: 8.5 ug/mL
Microalb/Creat Ratio: 29.9 mg/g creat (ref 0.0–30.0)

## 2016-07-08 ENCOUNTER — Other Ambulatory Visit: Payer: Self-pay | Admitting: Physician Assistant

## 2016-07-08 ENCOUNTER — Encounter: Payer: Self-pay | Admitting: Family Medicine

## 2016-08-02 MED FILL — TRUE METRIX TEST STRIP: 30 days supply | Qty: 100 | Fill #0

## 2016-08-02 MED FILL — ?CLOPIDOGREL 75 MG TABLET: 75 | 30 days supply | Qty: 30 | Fill #0

## 2016-08-02 MED FILL — !TRUE METRIX BLOOD GLUCOSE: 30 days supply | Qty: 1 | Fill #0

## 2016-08-02 MED FILL — ISOSORBIDE MN ER 60 MG TAB: 60 | 30 days supply | Qty: 30 | Fill #0

## 2016-08-02 MED FILL — NITROSTAT 0.4 MG TABLET SL: 0.4 | 25 days supply | Qty: 25 | Fill #0

## 2016-08-02 MED FILL — LOSARTAN POTASSIUM 25 MG TA: 25 | 30 days supply | Qty: 30 | Fill #0

## 2016-08-02 MED FILL — TRUEplus LANCETS 28G MISC: 30 days supply | Qty: 100 | Fill #0

## 2016-08-07 ENCOUNTER — Encounter: Payer: Self-pay | Admitting: Family Medicine

## 2016-08-07 ENCOUNTER — Ambulatory Visit (INDEPENDENT_AMBULATORY_CARE_PROVIDER_SITE_OTHER): Payer: Medicare Other | Admitting: Podiatry

## 2016-08-08 MED FILL — ?METFORMIN HCL 500MG TABLET: 500 | 30 days supply | Qty: 60 | Fill #0

## 2016-08-08 MED FILL — ATORVASTATIN 80 MG TABLET: 80 | 30 days supply | Qty: 30 | Fill #4

## 2016-08-08 MED FILL — ?CARVEDILOL 25 MG TABLET: 25 | 30 days supply | Qty: 60 | Fill #0

## 2016-08-08 NOTE — Progress Notes (Signed)
Patient ID: Darrell [name], male   DOB: Nov 29, 1955, 61 y.o.   MRN: [id number]   No-show, erroneous encounter

## 2016-09-11 MED FILL — ATORVASTATIN 80 MG TABLET: 80 | 30 days supply | Qty: 30 | Fill #5

## 2016-09-11 MED FILL — ISOSORBIDE MN ER 60 MG TAB: 60 | 30 days supply | Qty: 30 | Fill #1

## 2016-09-17 MED FILL — ?CARVEDILOL 25 MG TABLET: 25 | 30 days supply | Qty: 60 | Fill #1

## 2016-09-17 MED FILL — LOSARTAN POTASSIUM 25 MG TA: 25 | 30 days supply | Qty: 30 | Fill #1

## 2016-09-17 MED FILL — ?CLOPIDOGREL 75 MG TABLET: 75 | 30 days supply | Qty: 30 | Fill #1

## 2016-09-17 MED FILL — ?METFORMIN HCL 500MG TABLET: 500 | 30 days supply | Qty: 60 | Fill #1

## 2016-10-07 ENCOUNTER — Ambulatory Visit: Payer: Medicare Other | Admitting: Family Medicine

## 2016-11-05 MED FILL — ?METFORMIN HCL 500MG TABLET: 500 | 30 days supply | Qty: 60 | Fill #2

## 2016-11-05 MED FILL — ?CARVEDILOL 25 MG TABLET: 25 | 30 days supply | Qty: 60 | Fill #2

## 2016-11-05 MED FILL — ?CLOPIDOGREL 75MG TAB: 75 | 30 days supply | Qty: 30 | Fill #2

## 2016-11-05 MED FILL — ATORVASTATIN 80 MG TABLET: 80 | 30 days supply | Qty: 30 | Fill #6

## 2016-11-05 MED FILL — LOSARTAN POTASSIUM 25 MG TA: 25 | 30 days supply | Qty: 30 | Fill #2

## 2016-11-05 MED FILL — ISOSORBIDE MN ER 60 MG TAB: 60 | 30 days supply | Qty: 30 | Fill #2

## 2016-11-07 ENCOUNTER — Other Ambulatory Visit: Payer: Self-pay | Admitting: Cardiology

## 2016-11-09 ENCOUNTER — Encounter (HOSPITAL_COMMUNITY): Payer: Self-pay

## 2016-11-09 ENCOUNTER — Emergency Department (HOSPITAL_COMMUNITY): Payer: Medicare Other

## 2016-11-09 ENCOUNTER — Emergency Department (HOSPITAL_COMMUNITY)
Admission: EM | Admit: 2016-11-09 | Discharge: 2016-11-09 | Disposition: A | Discharge status: Home / Self Care | Payer: Medicare Other | Attending: Emergency Medicine | Admitting: Emergency Medicine

## 2016-11-09 DIAGNOSIS — Z79899 Other long term (current) drug therapy: Secondary | ICD-10-CM | POA: Insufficient documentation

## 2016-11-09 DIAGNOSIS — Z7982 Long term (current) use of aspirin: Secondary | ICD-10-CM | POA: Diagnosis not present

## 2016-11-09 DIAGNOSIS — E119 Type 2 diabetes mellitus without complications: Secondary | ICD-10-CM | POA: Diagnosis not present

## 2016-11-09 DIAGNOSIS — Z87891 Personal history of nicotine dependence: Secondary | ICD-10-CM | POA: Insufficient documentation

## 2016-11-09 DIAGNOSIS — I252 Old myocardial infarction: Secondary | ICD-10-CM | POA: Diagnosis not present

## 2016-11-09 DIAGNOSIS — N289 Disorder of kidney and ureter, unspecified: Secondary | ICD-10-CM | POA: Insufficient documentation

## 2016-11-09 DIAGNOSIS — I10 Essential (primary) hypertension: Secondary | ICD-10-CM | POA: Diagnosis not present

## 2016-11-09 DIAGNOSIS — I2510 Atherosclerotic heart disease of native coronary artery without angina pectoris: Secondary | ICD-10-CM | POA: Insufficient documentation

## 2016-11-09 DIAGNOSIS — Z7984 Long term (current) use of oral hypoglycemic drugs: Secondary | ICD-10-CM | POA: Diagnosis not present

## 2016-11-09 DIAGNOSIS — Z7901 Long term (current) use of anticoagulants: Secondary | ICD-10-CM | POA: Diagnosis not present

## 2016-11-09 DIAGNOSIS — R079 Chest pain, unspecified: Secondary | ICD-10-CM | POA: Diagnosis not present

## 2016-11-09 DIAGNOSIS — E875 Hyperkalemia: Secondary | ICD-10-CM

## 2016-11-09 LAB — BASIC METABOLIC PANEL
Anion gap: 5 (ref 5–15)
Anion gap: 3 — ABNORMAL LOW (ref 5–15)
BUN: 17 mg/dL (ref 6–20)
BUN: 18 mg/dL (ref 6–20)
CALCIUM: 8.3 mg/dL — AB (ref 8.9–10.3)
CALCIUM: 8.6 mg/dL — AB (ref 8.9–10.3)
CO2: 22 mmol/L (ref 22–32)
CO2: 25 mmol/L (ref 22–32)
CREATININE: 1.4 mg/dL — AB (ref 0.61–1.24)
Chloride: 103 mmol/L (ref 101–111)
Chloride: 106 mmol/L (ref 101–111)
Creatinine, Ser: 1.41 mg/dL — ABNORMAL HIGH (ref 0.61–1.24)
GFR calc Af Amer: >60 mL/min (ref >60)
GFR calc Af Amer: >60 mL/min (ref >60)
GFR, EST NON AFRICAN AMERICAN: 53 mL/min — AB (ref >60)
GFR, EST NON AFRICAN AMERICAN: 53 mL/min — AB (ref >60)
GLUCOSE: 106 mg/dL — AB (ref 65–99)
Glucose, Bld: 110 mg/dL — ABNORMAL HIGH (ref 65–99)
POTASSIUM: 5.7 mmol/L — AB (ref 3.5–5.1)
POTASSIUM: 6 mmol/L — AB (ref 3.5–5.1)
SODIUM: 130 mmol/L — AB (ref 135–145)
SODIUM: 134 mmol/L — AB (ref 135–145)

## 2016-11-09 LAB — CBC
HEMATOCRIT: 39.1 % (ref 39.0–52.0)
Hemoglobin: 12.5 g/dL — ABNORMAL LOW (ref 13.0–17.0)
MCH: 28.5 pg (ref 26.0–34.0)
MCHC: 32 g/dL (ref 30.0–36.0)
MCV: 89.1 fL (ref 78.0–100.0)
PLATELETS: 173 10*3/uL (ref 150–400)
RBC: 4.39 MIL/uL (ref 4.22–5.81)
RDW: 14.5 % (ref 11.5–15.5)
WBC: 6.2 10*3/uL (ref 4.0–10.5)

## 2016-11-09 LAB — I-STAT TROPONIN, ED
TROPONIN I, POC: 0 ng/mL (ref 0.00–0.08)
TROPONIN I, POC: 0.02 ng/mL (ref 0.00–0.08)

## 2016-11-09 MED ORDER — SODIUM POLYSTYRENE SULFONATE 15 GM/60ML PO SUSP
15.0000 g | Freq: Once | ORAL | Status: AC
  Administered 2016-11-09: 15 g via ORAL
  Filled 2016-11-09: qty 60

## 2016-11-09 MED ORDER — FUROSEMIDE 10 MG/ML IJ SOLN
40.0000 mg | Freq: Once | INTRAMUSCULAR | Status: AC
  Administered 2016-11-09: 40 mg via INTRAVENOUS
  Filled 2016-11-09: qty 4

## 2016-11-09 MED ORDER — SODIUM CHLORIDE 0.9 % IV BOLUS (SEPSIS)
500.0000 mL | Freq: Once | INTRAVENOUS | Status: AC
  Administered 2016-11-09: 500 mL via INTRAVENOUS

## 2016-11-09 NOTE — ED Notes (Signed)
Pt placed on contact precautions due to EMS reporting bed bugs at home, no bed bugs found on pt in the ED at this time. Precautions still in place

## 2016-11-09 NOTE — ED Triage Notes (Signed)
Pt from home c.o central non radiating CP that started at 8am this morning. Pain went from 10/10 to 0/10 after 2 Nitros from EMS. Pt also took 324 ASA at home. Pt hypertensive, initially 258/158 then 189/125 after 2 Nitro. Pt a.o, nad, 20RAC

## 2016-11-09 NOTE — Discharge Instructions (Signed)
Make sure you are drinking at least a liter of water each day.  Try to eat 3 meals each day, to keep your strength up.  Your doctor may need to adjust her medicines when they see you.

## 2016-11-09 NOTE — ED Provider Notes (Signed)
[facility] DEPT Provider Note   CSN: [id number] Arrival date & time: 11/09/16  1105     History   Chief Complaint Chief Complaint  Patient presents with  . Chest Pain    HPI Darrell [name] is a 61 y.o. male.  Patient developed chest pain this morning, and suspected that his blood pressure was elevated because of the chest discomfort.  The chest pain is described as mild.  He was transferred by EMS, and during transport received nitroglycerin and aspirin.  He has been taking his usual medications.  He denies cough, persistent shortness of breath, productive cough, fever, chills, nausea or vomiting.  He feels like his chest pain gets worse when he takes a deep breath.  There are no other known modifying factors.    HPI  Past Medical History:  Diagnosis Date  . Claudication ([place])   . Coronary artery disease    a. 08/2007 s/p DES to RCA/RI;  b. 07/2006 s/p DES to p/d LCX;  c. 11/2007 CABGx5 (LIMA->LAD, VG->D1, LRA->OM1, VG->PDA->LPL);  d. 09/2012 STEMI/Cath: 3VD, LIMA->LAD nl (LAD 95d), RA->OM nl, VG->PDA->LPL 100, unable to locate VG->Diag, EF 60-65%->Med Rx. e. 03/2015: Synergy DES to mid-Cx in the setting of NSTEMI; f. 11/2015 NSTEMI->Med Rx.  . Diabetes mellitus without complication ([place])    a. A1c 7.0 09/2012.  . Diastolic dysfunction    a. 02/2016 Echo: EF 50-55%, gr1DD, Ao sclerosis, dil Ao root (38m), sev dil LA, triv TR, PASP 271mg.  . Marland Kitchenyperlipidemia   . Hypertension   . Noncompliance     Patient Active Problem List   Diagnosis Date Noted  . Sepsis ([facility]02/03/2016  . Chest pain   . Hyponatremia 01/03/2016  . Hypertensive emergency   . NSTEMI (non-ST elevated myocardial infarction) ([facility]03/07/2015  . HTN (hypertension) 04/04/2015  . Hypertensive urgency 03/25/2014  . Noncompliance 03/25/2014  . Claudication ([facility] [place]10/01/2012  . GERD 10/23/2007  . NEPHROLITHIASIS 10/23/2007  . HYPERCHOLESTEROLEMIA 10/22/2007  . ADJUSTMENT DISORDER WITH DEPRESSED MOOD  10/22/2007  . Type 2 diabetes mellitus without complication, without long-term current use of insulin ([place]CL[place]01/01/2003  . Coronary atherosclerosis, s/p CABG in 2009  01/28/2001    Past Surgical History:  Procedure Laterality Date  . CARDIAC CATHETERIZATION N/A 04/06/2015   Procedure: Left Heart Cath and Cors/Grafts Angiography;  Surgeon: JoLorretta HarpMD;  [facility];  Service: Cardiovascular;  Laterality: N/A;  . CARDIAC CATHETERIZATION N/A 04/06/2015   Procedure: Coronary Stent Intervention;  Surgeon: JoLorretta HarpMD;  2.5 mm x 12 mm long Synergy DES followed by  2.5 mm x 16 mm long Synergy DES    . CORONARY ARTERY BYPASS GRAFT     2009 LIMA to LAD, SVG to Diag, SVG to PDA and PL, left radial to OM  . LEFT HEART CATH N/A 10/24/2012   Procedure: LEFT HEART CATH;  Surgeon: ThTroy SineMD;  [facility];  Service: Cardiovascular;  Laterality: N/A;       Home Medications    Prior to Admission medications   Medication Sig Start Date End Date Taking? Authorizing Provider  amLODipine (NORVASC) 10 MG tablet Take 1 tablet (10 mg total) by mouth daily. 07/05/16  Yes AmArnoldo MoraleMD  aspirin 81 MG EC tablet Take 1 tablet (81 mg total) by mouth daily. 03/12/16  Yes AmArnoldo MoraleMD  atorvastatin (LIPITOR) 80 MG tablet Take 1 tablet (80 mg total) by mouth daily. 03/12/16  Yes AmArnoldo MoraleMD  carvedilol (COREG) 25 MG tablet Take 1 tablet (25 mg total) by mouth 2 (two) times daily with a meal. 07/05/16  Yes Amao, Odette Horns, MD  clopidogrel (PLAVIX) 75 MG tablet Take 1 tablet (75 mg total) by mouth daily. 07/05/16  Yes Jaclyn Shaggy, MD  isosorbide mononitrate (IMDUR) 60 MG 24 hr tablet Take 1 tablet (60 mg total) by mouth daily. 07/05/16  Yes Jaclyn Shaggy, MD  losartan (COZAAR) 25 MG tablet Take 1 tablet (25 mg total) by mouth daily. 07/05/16  Yes Jaclyn Shaggy, MD  metFORMIN (GLUCOPHAGE) 500 MG tablet Take 1 tablet (500 mg total) by mouth 2 (two) times daily with a  meal. 07/05/16  Yes Amao, Enobong, MD  NITROSTAT 0.4 MG SL tablet PLACE 1 TABLET UNDER THE TONGUE EVERY 5 MINUTES AS NEEDED FOR CHEST PAIN 11/07/16  Yes [name], MD  Blood Glucose Monitoring Suppl (TRUE METRIX METER) w/Device KIT USE AS INSTRUCTED 07/05/16   Jaclyn Shaggy, MD  glucose blood (TRUE METRIX BLOOD GLUCOSE TEST) test strip Use as instructed 07/05/16   Jaclyn Shaggy, MD  Lancet Devices [facility]) lancets Use as instructed daily. 07/05/16   Jaclyn Shaggy, MD  TRUEPLUS LANCETS 28G MISC USE AS INSTRUCTED 07/05/16   Jaclyn Shaggy, MD    Family History Family History  Problem Relation Age of Onset  . Heart failure Mother   . Cancer Mother   . CAD Mother 59  . Heart failure Father   . Kidney failure Brother     Social History Social History  Substance Use Topics  . Smoking status: Former Smoker    Quit date: 10/25/2010  . Smokeless tobacco: Never Used  . Alcohol use No     Comment: sts he drank for the first time in a long time last night      Allergies   Patient has no known allergies.   Review of Systems Review of Systems  All other systems reviewed and are negative.    Physical Exam Updated Vital Signs BP (!) 156/95   Pulse 72   Temp 98.8 F (37.1 C) (Oral)   Resp 14   Ht 6\' 1"  (1.854 m)   Wt 92.5 kg (204 lb)   SpO2 96%   BMI 26.91 kg/m   Physical Exam  Constitutional: He is oriented to person, place, and time. He appears well-developed. No distress.  He is unkempt  HENT:  Head: Normocephalic and atraumatic.  Right Ear: External ear normal.  Left Ear: External ear normal.  Eyes: Pupils are equal, round, and reactive to light. Conjunctivae and EOM are normal.  Neck: Normal range of motion and phonation normal. Neck supple.  Cardiovascular: Normal rate, regular rhythm and normal heart sounds.   Pulmonary/Chest: Effort normal and breath sounds normal. No respiratory distress. He has no wheezes. He exhibits no tenderness and no bony tenderness.    Abdominal: Soft. There is no tenderness.  Musculoskeletal: Normal range of motion.  Neurological: He is alert and oriented to person, place, and time. No cranial nerve deficit or sensory deficit. He exhibits normal muscle tone. Coordination normal.  Skin: Skin is warm, dry and intact.  Superficial small red abrasions, of the ankles bilaterally, consistent with insect bites.  Bleeding, drainage, or vesicles.  Psychiatric: He has a normal mood and affect. His behavior is normal. Judgment and thought content normal.  Nursing note and vitals reviewed.    ED Treatments / Results  Labs (all labs ordered are listed, but only abnormal results are displayed) Labs  Reviewed  BASIC METABOLIC PANEL - Abnormal; Notable for the following:       Result Value   Sodium 130 (*)    Potassium 5.7 (*)    Glucose, Bld 110 (*)    Creatinine, Ser 1.41 (*)    Calcium 8.3 (*)    GFR calc non Af Amer 53 (*)    All other components within normal limits  CBC - Abnormal; Notable for the following:    Hemoglobin 12.5 (*)    All other components within normal limits  BASIC METABOLIC PANEL - Abnormal; Notable for the following:    Sodium 134 (*)    Potassium 6.0 (*)    Glucose, Bld 106 (*)    Creatinine, Ser 1.40 (*)    Calcium 8.6 (*)    GFR calc non Af Amer 53 (*)    Anion gap 3 (*)    All other components within normal limits  I-STAT TROPONIN, ED  I-STAT TROPONIN, ED    BUN  Date Value Ref Range Status  11/09/2016 18 6 - 20 mg/dL Final  11/09/2016 17 6 - 20 mg/dL Final  03/26/2016 23 7 - 25 mg/dL Final  03/08/2016 14 6 - 20 mg/dL Final   Creat  Date Value Ref Range Status  03/26/2016 1.18 0.70 - 1.33 mg/dL Final    Comment:      For patients > or = 60 years of age: The upper reference limit for Creatinine is approximately 13% higher for people identified as African-American.      Creatinine, Ser  Date Value Ref Range Status  11/09/2016 1.40 (H) 0.61 - 1.24 mg/dL Final  11/09/2016 1.41  (H) 0.61 - 1.24 mg/dL Final  03/08/2016 1.10 0.61 - 1.24 mg/dL Final  03/07/2016 1.10 0.61 - 1.24 mg/dL Final   .trend  EKG  EKG Interpretation  Date/Time:  Saturday November 09 2016 11:13:46 EDT Ventricular Rate:  77 PR Interval:    QRS Duration: 106 QT Interval:  396 QTC Calculation: 449 R Axis:   63 Text Interpretation:  Sinus rhythm Probable left atrial enlargement Anterior infarct, old since last tracing no significant change Confirmed by Daleen Bo [id number]) on 11/09/2016 2:54:23 PM       Radiology Dg Chest Port 1 View  Result Date: 11/09/2016 CLINICAL DATA:  Chest pain EXAM: PORTABLE CHEST 1 VIEW COMPARISON:  03/02/2016 FINDINGS: Lungs are clear.  No pleural effusion or pneumothorax. The heart is normal in size. Postsurgical changes related to prior CABG. Median sternotomy. IMPRESSION: No evidence of acute cardiopulmonary disease. Electronically Signed   By: Julian Hy M.D.   On: 11/09/2016 12:58    Procedures Procedures (including critical care time)  Medications Ordered in ED Medications  sodium chloride 0.9 % bolus 500 mL (0 mLs Intravenous Stopped 11/09/16 1442)  furosemide (LASIX) injection 40 mg (40 mg Intravenous Given 11/09/16 1351)  sodium polystyrene (KAYEXALATE) 15 GM/60ML suspension 15 g (15 g Oral Given 11/09/16 1347)     Initial Impression / Assessment and Plan / ED Course  I have reviewed the triage vital signs and the nursing notes.  Pertinent labs & imaging results that were available during my care of the patient were reviewed by me and considered in my medical decision making (see chart for details).  Clinical Course as of Nov 10 1802  Sat Nov 09, 2016  1436 Elevated, this has been rechecked. Potassium: (!) 6.0 [EW]  1436 Elevated Creatinine: (!) 1.40 [EW]  1436 Slightly low Hemoglobin: (!) 12.5 [EW]  1436 Normal Troponin i, poc: 0.00 [EW]  1436 NAD ED EKG within 10 minutes [EW]  5859 ED EKG within 10 minutes [EW]  1800 Patient has  been able to eat and drink here.  He has had 2 loose bowel movements after the Kayexalate treatment.  [EW]    Clinical Course User Index [EW] Daleen Bo, MD     Patient Vitals for the past 24 hrs:  BP Temp Temp src Pulse Resp SpO2 Height Weight  11/09/16 1530 (!) 156/95 - - 72 14 96 % - -  11/09/16 1500 (!) 148/88 - - 68 - 95 % - -  11/09/16 1430 (!) 148/87 - - 75 17 95 % - -  11/09/16 1300 (!) 155/94 - - 77 16 96 % - -  11/09/16 1145 (!) 174/93 - - 74 17 97 % - -  11/09/16 1130 (!) 174/93 - - 75 (!) 21 98 % - -  11/09/16 1117 - - - - - - '6\' 1"'$  (1.854 m) 92.5 kg (204 lb)  11/09/16 1116 (!) 187/105 98.8 F (37.1 C) Oral 78 20 97 % - -  11/09/16 1115 - - - - - 98 % - -         Final Clinical Impressions(s) / ED Diagnoses   Final diagnoses:  Nonspecific chest pain  Hyperkalemia  Renal insufficiency   Nonspecific chest pain, doubt ACS, PE or pneumonia.  Incidental creatinine and potassium elevation, likely secondary to dehydration however may be secondary to use of [name].  Patient improved after treatment here and is stable for discharge, with close follow-up.  Nursing Notes Reviewed/ Care Coordinated Applicable Imaging Reviewed Interpretation of Laboratory Data incorporated into ED treatment  The patient appears reasonably screened and/or stabilized for discharge and I doubt any other medical condition or other [facility] requiring further screening, evaluation, or treatment in the ED at this time prior to discharge.  Plan: Home Medications-continue usual medications; Home Treatments-rest, increase oral fluids, 3 meals each day; return here if the recommended treatment, does not improve the symptoms; Recommended follow up-PCP checkup 3 days for repeat testing of potassium level and kidney function    New Prescriptions New Prescriptions   No medications on file     Daleen Bo, MD 11/09/16 1805

## 2016-11-09 NOTE — ED Notes (Signed)
Repeat troponin 0.02, not crossing over into chart, EDP aware of results

## 2016-11-09 NOTE — ED Notes (Signed)
Portable xray at bedside.

## 2016-11-09 NOTE — ED Notes (Signed)
Pt provided with ginger ale and turkey sandwich.

## 2016-12-13 MED FILL — ATORVASTATIN 80 MG TABLET: 80 | 30 days supply | Qty: 30 | Fill #7

## 2016-12-13 MED FILL — ?CLOPIDOGREL 75MG TAB: 75 | 30 days supply | Qty: 30 | Fill #3

## 2016-12-13 MED FILL — ISOSORBIDE MN ER 60 MG TAB: 60 | 30 days supply | Qty: 30 | Fill #3

## 2016-12-13 MED FILL — NITROSTAT 0.4 MG TABLET SL: 0.4 | 25 days supply | Qty: 25 | Fill #0

## 2016-12-13 MED FILL — ?METFORMIN HCL 500MG TABLET: 500 | 30 days supply | Qty: 60 | Fill #3

## 2016-12-13 MED FILL — ?CARVEDILOL 25 MG TABLET: 25 | 30 days supply | Qty: 60 | Fill #3

## 2016-12-13 MED FILL — LOSARTAN POTASSIUM 25 MG TA: 25 | 30 days supply | Qty: 30 | Fill #3

## 2017-03-30 ENCOUNTER — Other Ambulatory Visit: Payer: Self-pay

## 2017-03-30 ENCOUNTER — Emergency Department (HOSPITAL_COMMUNITY): Payer: Medicare Other

## 2017-03-30 ENCOUNTER — Encounter (HOSPITAL_COMMUNITY): Payer: Self-pay

## 2017-03-30 ENCOUNTER — Emergency Department (HOSPITAL_COMMUNITY)
Admission: EM | Admit: 2017-03-30 | Discharge: 2017-03-30 | Disposition: A | Discharge status: Home / Self Care | Payer: Medicare Other | Attending: Emergency Medicine | Admitting: Emergency Medicine

## 2017-03-30 DIAGNOSIS — Z79899 Other long term (current) drug therapy: Secondary | ICD-10-CM | POA: Diagnosis not present

## 2017-03-30 DIAGNOSIS — Z7982 Long term (current) use of aspirin: Secondary | ICD-10-CM | POA: Diagnosis not present

## 2017-03-30 DIAGNOSIS — Z794 Long term (current) use of insulin: Secondary | ICD-10-CM | POA: Insufficient documentation

## 2017-03-30 DIAGNOSIS — R072 Precordial pain: Secondary | ICD-10-CM | POA: Insufficient documentation

## 2017-03-30 DIAGNOSIS — Z87891 Personal history of nicotine dependence: Secondary | ICD-10-CM | POA: Insufficient documentation

## 2017-03-30 DIAGNOSIS — I252 Old myocardial infarction: Secondary | ICD-10-CM | POA: Diagnosis not present

## 2017-03-30 DIAGNOSIS — R0789 Other chest pain: Secondary | ICD-10-CM

## 2017-03-30 DIAGNOSIS — I10 Essential (primary) hypertension: Secondary | ICD-10-CM

## 2017-03-30 DIAGNOSIS — I25810 Atherosclerosis of coronary artery bypass graft(s) without angina pectoris: Secondary | ICD-10-CM | POA: Insufficient documentation

## 2017-03-30 DIAGNOSIS — R079 Chest pain, unspecified: Secondary | ICD-10-CM | POA: Diagnosis not present

## 2017-03-30 DIAGNOSIS — E1151 Type 2 diabetes mellitus with diabetic peripheral angiopathy without gangrene: Secondary | ICD-10-CM | POA: Insufficient documentation

## 2017-03-30 LAB — CBC
HEMATOCRIT: 41.7 % (ref 39.0–52.0)
Hemoglobin: 13.7 g/dL (ref 13.0–17.0)
MCH: 29.5 pg (ref 26.0–34.0)
MCHC: 32.9 g/dL (ref 30.0–36.0)
MCV: 89.7 fL (ref 78.0–100.0)
Platelets: 180 10*3/uL (ref 150–400)
RBC: 4.65 MIL/uL (ref 4.22–5.81)
RDW: 14.1 % (ref 11.5–15.5)
WBC: 6.1 10*3/uL (ref 4.0–10.5)

## 2017-03-30 LAB — BASIC METABOLIC PANEL
Anion gap: 10 (ref 5–15)
BUN: 18 mg/dL (ref 6–20)
CALCIUM: 8.4 mg/dL — AB (ref 8.9–10.3)
CHLORIDE: 104 mmol/L (ref 101–111)
CO2: 21 mmol/L — AB (ref 22–32)
CREATININE: 1.06 mg/dL (ref 0.61–1.24)
GFR calc non Af Amer: >60 mL/min (ref >60)
Glucose, Bld: 82 mg/dL (ref 65–99)
Potassium: 4.1 mmol/L (ref 3.5–5.1)
SODIUM: 135 mmol/L (ref 135–145)

## 2017-03-30 LAB — I-STAT TROPONIN, ED: Troponin i, poc: 0.04 ng/mL (ref 0.00–0.08)

## 2017-03-30 LAB — CBG MONITORING, ED: Glucose-Capillary: 116 mg/dL — ABNORMAL HIGH (ref 65–99)

## 2017-03-30 MED ORDER — AMLODIPINE BESYLATE 10 MG PO TABS: 10.0000 mg | ORAL_TABLET | Freq: Every day | ORAL | 0 refills | Status: DC

## 2017-03-30 MED ORDER — CARVEDILOL 25 MG PO TABS: 25.0000 mg | ORAL_TABLET | Freq: Two times a day (BID) | ORAL | 0 refills | Status: DC

## 2017-03-30 MED ORDER — CARVEDILOL 25 MG PO TABS: 12.5000 mg | ORAL_TABLET | Freq: Two times a day (BID) | ORAL | 0 refills | Status: DC

## 2017-03-30 MED ORDER — CARVEDILOL 12.5 MG PO TABS
25.0000 mg | ORAL_TABLET | Freq: Once | ORAL | Status: AC
  Administered 2017-03-30: 25 mg via ORAL
  Filled 2017-03-30: qty 2

## 2017-03-30 MED ORDER — AMLODIPINE BESYLATE 5 MG PO TABS
10.0000 mg | ORAL_TABLET | Freq: Once | ORAL | Status: AC
  Administered 2017-03-30: 10 mg via ORAL
  Filled 2017-03-30: qty 2

## 2017-03-30 NOTE — Discharge Instructions (Signed)
It was our pleasure to provide your ER care today - we hope that you feel better.  Take blood pressure medication as prescribed.  Follow up with primary care doctor in the next 2-3 days for recheck and recheck of blood pressure.  For chest discomfort, follow up with cardiologist in the coming week - call office tomorrow to arrange appointment.   Return to ER if worse, new symptoms, trouble breathing, recurrent or persistent chest pain, other concern.

## 2017-03-30 NOTE — ED Provider Notes (Signed)
Darrell [name] Provider Note   CSN: [id number] Arrival date & time: 03/30/17  2011     History   Chief Complaint Chief Complaint  Patient presents with  . Chest Pain    HPI Darrell [name] is a 62 y.o. male.  Patient c/o earlier episode chest pain at rest. Lasted a few minutes.pain was mild, non radiating, without specific exacerbating or alleviating factors.  No chest pain or discomfort currently. Also is concerned that his blood sugar is high. States he has not taken any of his medication for the past several months. Denies cough or uri symptoms. No fever or chills. Denies abd pain or n/v. No polyuria or polydipsia.    The history is provided by the patient and the EMS personnel.  Chest Pain   Pertinent negatives include no abdominal pain, no back pain, no cough, no fever, no headaches, no numbness, no shortness of breath and no weakness.    Past Medical History:  Diagnosis Date  . Claudication ([place])   . Coronary artery disease    a. 08/2007 s/p DES to RCA/RI;  b. 07/2006 s/p DES to p/d LCX;  c. 11/2007 CABGx5 (LIMA->LAD, VG->D1, LRA->OM1, VG->PDA->LPL);  d. 09/2012 STEMI/Cath: 3VD, LIMA->LAD nl (LAD 95d), RA->OM nl, VG->PDA->LPL 100, unable to locate VG->Diag, EF 60-65%->Med Rx. e. 03/2015: Synergy DES to mid-Cx in the setting of NSTEMI; f. 11/2015 NSTEMI->Med Rx.  . Diabetes mellitus without complication ([place])    a. A1c 7.0 09/2012.  . Diastolic dysfunction    a. 02/2016 Echo: EF 50-55%, gr1DD, Ao sclerosis, dil Ao root (62m), sev dil LA, triv TR, PASP 252mg.  . Marland Kitchenyperlipidemia   . Hypertension   . Noncompliance     Patient Active Problem List   Diagnosis Date Noted  . Sepsis ([place]/03/2016  . Chest pain   . Hyponatremia 01/03/2016  . Hypertensive emergency   . NSTEMI (non-ST elevated myocardial infarction) ([facility]CI[place]03/07/2015  . HTN (hypertension) 04/04/2015  . Hypertensive urgency 03/25/2014  . Noncompliance 03/25/2014  .  Claudication ([place]/01/2012  . GERD 10/23/2007  . NEPHROLITHIASIS 10/23/2007  . HYPERCHOLESTEROLEMIA 10/22/2007  . ADJUSTMENT DISORDER WITH DEPRESSED MOOD 10/22/2007  . Type 2 diabetes mellitus without complication, without long-term current use of insulin (HCH[place]01/01/2003  . Coronary atherosclerosis, s/p CABG in 2009  01/28/2001    Past Surgical History:  Procedure Laterality Date  . CARDIAC CATHETERIZATION N/A 04/06/2015   Procedure: Left Heart Cath and Cors/Grafts Angiography;  Surgeon: JoLorretta HarpMD;  [facility];  Service: Cardiovascular;  Laterality: N/A;  . CARDIAC CATHETERIZATION N/A 04/06/2015   Procedure: Coronary Stent Intervention;  Surgeon: JoLorretta HarpMD;  2.5 mm x 12 mm long Synergy DES followed by  2.5 mm x 16 mm long Synergy DES    . CORONARY ARTERY BYPASS GRAFT     2009 LIMA to LAD, SVG to Diag, SVG to PDA and PL, left radial to OM  . LEFT HEART CATH N/A 10/24/2012   Procedure: LEFT HEART CATH;  Surgeon: ThTroy SineMD;  [facility];  Service: Cardiovascular;  Laterality: N/A;       Home Medications    Prior to Admission medications   Medication Sig Start Date End Date Taking? Authorizing Provider  amLODipine (NORVASC) 10 MG tablet Take 1 tablet (10 mg total) by mouth daily. 07/05/16   NeCharlott RakesMD  aspirin 81 MG EC tablet Take 1 tablet (81 mg total) by  mouth daily. 03/12/16   [name], MD  atorvastatin (LIPITOR) 80 MG tablet Take 1 tablet (80 mg total) by mouth daily. 03/12/16   [name], MD  Blood Glucose Monitoring Suppl (TRUE METRIX METER) w/Device KIT USE AS INSTRUCTED 07/05/16   [name], MD  carvedilol (COREG) 25 MG tablet Take 1 tablet (25 mg total) by mouth 2 (two) times daily with a meal. 07/05/16   [name], MD  clopidogrel (PLAVIX) 75 MG tablet Take 1 tablet (75 mg total) by mouth daily. 07/05/16   [name], MD  glucose blood (TRUE METRIX BLOOD GLUCOSE TEST) test strip Use as  instructed 07/05/16   [name], MD  isosorbide mononitrate (IMDUR) 60 MG 24 hr tablet Take 1 tablet (60 mg total) by mouth daily. 07/05/16   [name], MD  Lancet [facility]) lancets Use as instructed daily. 07/05/16   [name], MD  losartan (COZAAR) 25 MG tablet Take 1 tablet (25 mg total) by mouth daily. 07/05/16   [name], MD  metFORMIN (GLUCOPHAGE) 500 MG tablet Take 1 tablet (500 mg total) by mouth 2 (two) times daily with a meal. 07/05/16   Newlin, Enobong, MD  NITROSTAT 0.4 MG SL tablet PLACE 1 TABLET UNDER THE TONGUE EVERY 5 MINUTES AS NEEDED FOR CHEST PAIN 11/07/16   Minus Breeding, MD  TRUEPLUS LANCETS 28G MISC USE AS INSTRUCTED 07/05/16   [name], MD    Family History Family History  Problem Relation Age of Onset  . Heart failure Mother   . Cancer Mother   . CAD Mother 37  . Heart failure Father   . Kidney failure Brother     Social History Social History   Tobacco Use  . Smoking status: Former Smoker    Last attempt to quit: 10/25/2010    Years since quitting: 6.4  . Smokeless tobacco: Never Used  Substance Use Topics  . Alcohol use: No    Comment: sts he drank for the first time in a long time last night   . Drug use: No     Allergies   Patient has no known allergies.   Review of Systems Review of Systems  Constitutional: Negative for fever.  HENT: Negative for sore throat.   Eyes: Negative for redness.  Respiratory: Negative for cough and shortness of breath.   Cardiovascular: Positive for chest pain.  Gastrointestinal: Negative for abdominal pain.  Genitourinary: Negative for dysuria and flank pain.  Musculoskeletal: Negative for back pain and neck pain.  Skin: Negative for rash.  Neurological: Negative for weakness, numbness and headaches.  Hematological: Does not bruise/bleed easily.  Psychiatric/Behavioral: Negative for confusion.     Physical Exam Updated Vital Signs BP (!) 202/117 (BP Location: Right  Arm)   Pulse 93   Temp 97.9 F (36.6 C) (Oral)   Resp 16   Ht 1.854 m ('6\' 1"'$ )   Wt 92.1 kg (203 lb)   SpO2 98%   BMI 26.78 kg/m   Physical Exam  Constitutional: He appears well-developed and well-nourished. No distress.  HENT:  Mouth/Throat: Oropharynx is clear and moist.  Eyes: Conjunctivae are normal.  Neck: Neck supple. No tracheal deviation present.  Cardiovascular: Normal rate, regular rhythm, normal heart sounds and intact distal pulses. Exam reveals no gallop and no friction rub.  No murmur heard. Pulmonary/Chest: Effort normal and breath sounds normal. No accessory muscle usage. No respiratory distress. He exhibits no tenderness.  Abdominal: Soft. Bowel sounds are normal. He exhibits no distension.  There is no tenderness.  Genitourinary:  Genitourinary Comments: No cva tenderness  Musculoskeletal: He exhibits no edema or tenderness.  Neurological: He is alert.  Speech normal/fluent. Ambulates w steady gait.   Skin: Skin is warm and dry. He is not diaphoretic.  Psychiatric: He has a normal mood and affect.  Nursing note and vitals reviewed.    ED Treatments / Results  Labs (all labs ordered are listed, but only abnormal results are displayed) Results for orders placed or performed during the hospital encounter of 03/30/17  CBC  Result Value Ref Range   WBC 6.1 4.0 - 10.5 K/uL   RBC 4.65 4.22 - 5.81 MIL/uL   Hemoglobin 13.7 13.0 - 17.0 g/dL   HCT 41.7 39.0 - 52.0 %   MCV 89.7 78.0 - 100.0 fL   MCH 29.5 26.0 - 34.0 pg   MCHC 32.9 30.0 - 36.0 g/dL   RDW 14.1 11.5 - 15.5 %   Platelets 180 150 - 400 K/uL  Basic metabolic panel  Result Value Ref Range   Sodium 135 135 - 145 mmol/L   Potassium 4.1 3.5 - 5.1 mmol/L   Chloride 104 101 - 111 mmol/L   CO2 21 (L) 22 - 32 mmol/L   Glucose, Bld 82 65 - 99 mg/dL   BUN 18 6 - 20 mg/dL   Creatinine, Ser 1.06 0.61 - 1.24 mg/dL   Calcium 8.4 (L) 8.9 - 10.3 mg/dL   GFR calc non Af Amer >60 >60 mL/min   GFR calc Af Amer  >60 >60 mL/min   Anion gap 10 5 - 15  I-stat troponin, ED  Result Value Ref Range   Troponin i, poc 0.04 0.00 - 0.08 ng/mL   Comment 3          CBG monitoring, ED  Result Value Ref Range   Glucose-Capillary 116 (H) 65 - 99 mg/dL   Dg Chest Port 1 View  Result Date: 03/30/2017 CLINICAL DATA:  Patient with mid chest pain. EXAM: PORTABLE CHEST 1 VIEW COMPARISON:  Chest radiograph 11/09/2016. FINDINGS: Monitoring leads overlie the patient. Stable cardiomegaly status post median sternotomy. No consolidative pulmonary opacities. No pleural effusion or pneumothorax. IMPRESSION: Cardiomegaly.  No acute cardiopulmonary process. Electronically Signed   By: Lovey Newcomer M.D.   On: 03/30/2017 21:31    EKG  EKG Interpretation  Date/Time:  Sunday March 30 2017 20:18:20 EST Ventricular Rate:  95 PR Interval:    QRS Duration: 113 QT Interval:  388 QTC Calculation: 488 R Axis:   95 Text Interpretation:  Sinus rhythm Non-specific ST-t changes Confirmed by Lajean Saver [phone]) on 03/30/2017 8:52:08 PM       Radiology Dg Chest Port 1 View  Result Date: 03/30/2017 CLINICAL DATA:  Patient with mid chest pain. EXAM: PORTABLE CHEST 1 VIEW COMPARISON:  Chest radiograph 11/09/2016. FINDINGS: Monitoring leads overlie the patient. Stable cardiomegaly status post median sternotomy. No consolidative pulmonary opacities. No pleural effusion or pneumothorax. IMPRESSION: Cardiomegaly.  No acute cardiopulmonary process. Electronically Signed   By: Lovey Newcomer M.D.   On: 03/30/2017 21:31    Procedures Procedures (including critical care time)  Medications Ordered in ED Medications - No data to display   Initial Impression / Assessment and Plan / ED Course  I have reviewed the triage vital signs and the nursing notes.  Pertinent labs & imaging results that were available during my care of the patient were reviewed by me and considered in my medical decision making (see chart for  details).  Iv ns. Labs. Ecg.  Cxr.  Reviewed nursing notes and prior charts for additional history.   Recheck, no chest pain or discomfort. No sob. No headache.   Pt given dose of his normal bp meds.  bp improved.   No chest pain or discomfort. bp improved. Troponin negative.   Patient currently appears stable for d/c.     Final Clinical Impressions(s) / ED Diagnoses   Final diagnoses:  None    ED Discharge Orders    None       Lajean Saver, MD 03/30/17 2250

## 2017-03-30 NOTE — ED Triage Notes (Signed)
Pt arrived via [facility]; from home (lives in hotel) pt sitting on sofa at home and started having pressure in mid chest; Pt has hx of HTN; MI; CABG; DM; Pt rec'd 2 nitros, last nitro given at 1947; Pt self administered 324ASA; A/Ox4 and has not taken any medications for the past year. 252/192; 98;97 on RA; CBG 306

## 2017-03-30 NOTE — ED Notes (Signed)
Pt discharged from ED; instructions provided and scripts given; Pt encouraged to return to ED if symptoms worsen and to f/u with PCP; Pt verbalized understanding of all instructions 

## 2017-04-02 ENCOUNTER — Telehealth: Payer: Self-pay

## 2017-04-02 NOTE — Telephone Encounter (Signed)
Call received from the patient's wife, Aggie Cosierheresa. She said that she wanted to explain that they are struggling with paying bills and are concerned about her husband being able to pay his pharmacy bills. She said they lost their home and are currently staying at a hotel and that has become expensive.  Explained to her that she can make payments to [facility] as they are able, even if it is only $1.00   She was concerned that they had to pay it off all at once. Explained to her that he can continue to charge his medications with limited exceptions but he will need to apply for the [facility]. She can [facility] counselor to assist with this process.. She said that she was [facility] Monday, 04/07/17 to schedule a hospital follow up appointment.  Instructed her to also schedule an appointment with the Financial Counselor, She said that she would try to make appointments for the same day to limit the need to pay for transportation to the clinic. They use [employer] transportation for the medical appointments.

## 2018-02-05 ENCOUNTER — Emergency Department (HOSPITAL_COMMUNITY): Payer: Medicare Other

## 2018-02-05 ENCOUNTER — Inpatient Hospital Stay (HOSPITAL_COMMUNITY)
Admission: EM | Admit: 2018-02-05 | Discharge: 2018-02-08 | DRG: 281 | Disposition: A | Discharge status: Home / Self Care | Payer: Medicare Other | Attending: Cardiology | Admitting: Cardiology

## 2018-02-05 ENCOUNTER — Encounter (HOSPITAL_COMMUNITY): Payer: Self-pay

## 2018-02-05 ENCOUNTER — Other Ambulatory Visit: Payer: Self-pay

## 2018-02-05 DIAGNOSIS — I161 Hypertensive emergency: Secondary | ICD-10-CM

## 2018-02-05 DIAGNOSIS — I499 Cardiac arrhythmia, unspecified: Secondary | ICD-10-CM | POA: Diagnosis not present

## 2018-02-05 DIAGNOSIS — I252 Old myocardial infarction: Secondary | ICD-10-CM

## 2018-02-05 DIAGNOSIS — Z87891 Personal history of nicotine dependence: Secondary | ICD-10-CM

## 2018-02-05 DIAGNOSIS — I2510 Atherosclerotic heart disease of native coronary artery without angina pectoris: Secondary | ICD-10-CM | POA: Diagnosis present

## 2018-02-05 DIAGNOSIS — I160 Hypertensive urgency: Secondary | ICD-10-CM | POA: Diagnosis present

## 2018-02-05 DIAGNOSIS — E119 Type 2 diabetes mellitus without complications: Secondary | ICD-10-CM

## 2018-02-05 DIAGNOSIS — Z9119 Patient's noncompliance with other medical treatment and regimen: Secondary | ICD-10-CM

## 2018-02-05 DIAGNOSIS — Z599 Problem related to housing and economic circumstances, unspecified: Secondary | ICD-10-CM

## 2018-02-05 DIAGNOSIS — Z951 Presence of aortocoronary bypass graft: Secondary | ICD-10-CM

## 2018-02-05 DIAGNOSIS — Z8249 Family history of ischemic heart disease and other diseases of the circulatory system: Secondary | ICD-10-CM

## 2018-02-05 DIAGNOSIS — R000 Tachycardia, unspecified: Secondary | ICD-10-CM | POA: Diagnosis not present

## 2018-02-05 DIAGNOSIS — N183 Chronic kidney disease, stage 3 (moderate): Secondary | ICD-10-CM | POA: Diagnosis present

## 2018-02-05 DIAGNOSIS — Z7984 Long term (current) use of oral hypoglycemic drugs: Secondary | ICD-10-CM

## 2018-02-05 DIAGNOSIS — Z7982 Long term (current) use of aspirin: Secondary | ICD-10-CM

## 2018-02-05 DIAGNOSIS — E1122 Type 2 diabetes mellitus with diabetic chronic kidney disease: Secondary | ICD-10-CM | POA: Diagnosis present

## 2018-02-05 DIAGNOSIS — Z7902 Long term (current) use of antithrombotics/antiplatelets: Secondary | ICD-10-CM

## 2018-02-05 DIAGNOSIS — Z91199 Patient's noncompliance with other medical treatment and regimen due to unspecified reason: Secondary | ICD-10-CM

## 2018-02-05 DIAGNOSIS — Z9114 Patient's other noncompliance with medication regimen: Secondary | ICD-10-CM

## 2018-02-05 DIAGNOSIS — N189 Chronic kidney disease, unspecified: Secondary | ICD-10-CM | POA: Diagnosis present

## 2018-02-05 DIAGNOSIS — E7800 Pure hypercholesterolemia, unspecified: Secondary | ICD-10-CM | POA: Diagnosis present

## 2018-02-05 DIAGNOSIS — I129 Hypertensive chronic kidney disease with stage 1 through stage 4 chronic kidney disease, or unspecified chronic kidney disease: Secondary | ICD-10-CM | POA: Diagnosis present

## 2018-02-05 DIAGNOSIS — R072 Precordial pain: Secondary | ICD-10-CM | POA: Diagnosis not present

## 2018-02-05 DIAGNOSIS — I10 Essential (primary) hypertension: Secondary | ICD-10-CM | POA: Diagnosis not present

## 2018-02-05 DIAGNOSIS — I214 Non-ST elevation (NSTEMI) myocardial infarction: Secondary | ICD-10-CM | POA: Diagnosis present

## 2018-02-05 DIAGNOSIS — D649 Anemia, unspecified: Secondary | ICD-10-CM

## 2018-02-05 DIAGNOSIS — I25110 Atherosclerotic heart disease of native coronary artery with unstable angina pectoris: Secondary | ICD-10-CM | POA: Diagnosis present

## 2018-02-05 DIAGNOSIS — Z79899 Other long term (current) drug therapy: Secondary | ICD-10-CM

## 2018-02-05 DIAGNOSIS — Z955 Presence of coronary angioplasty implant and graft: Secondary | ICD-10-CM

## 2018-02-05 DIAGNOSIS — R0789 Other chest pain: Secondary | ICD-10-CM | POA: Diagnosis not present

## 2018-02-05 DIAGNOSIS — R0989 Other specified symptoms and signs involving the circulatory and respiratory systems: Secondary | ICD-10-CM | POA: Diagnosis present

## 2018-02-05 DIAGNOSIS — N179 Acute kidney failure, unspecified: Secondary | ICD-10-CM | POA: Diagnosis present

## 2018-02-05 DIAGNOSIS — Z841 Family history of disorders of kidney and ureter: Secondary | ICD-10-CM

## 2018-02-05 DIAGNOSIS — R079 Chest pain, unspecified: Secondary | ICD-10-CM | POA: Diagnosis not present

## 2018-02-05 DIAGNOSIS — E785 Hyperlipidemia, unspecified: Secondary | ICD-10-CM | POA: Diagnosis present

## 2018-02-05 HISTORY — DX: Chronic kidney disease, stage 3 (moderate): N18.3

## 2018-02-05 HISTORY — DX: Type 2 diabetes mellitus without complications: E11.9

## 2018-02-05 HISTORY — DX: Other specified symptoms and signs involving the circulatory and respiratory systems: R09.89

## 2018-02-05 HISTORY — DX: Chronic kidney disease, stage 3 unspecified: N18.30

## 2018-02-05 LAB — URINALYSIS, ROUTINE W REFLEX MICROSCOPIC
Bilirubin Urine: NEGATIVE
Glucose, UA: 50 mg/dL — AB
Ketones, ur: NEGATIVE mg/dL
LEUKOCYTES UA: NEGATIVE
NITRITE: NEGATIVE
Protein, ur: 100 mg/dL — AB
Specific Gravity, Urine: 1.019 (ref 1.005–1.030)
pH: 5 (ref 5.0–8.0)

## 2018-02-05 LAB — CBC WITH DIFFERENTIAL/PLATELET
Abs Immature Granulocytes: 0.04 10*3/uL (ref 0.00–0.07)
Basophils Absolute: 0 10*3/uL (ref 0.0–0.1)
Basophils Relative: 0 %
Eosinophils Absolute: 0 10*3/uL (ref 0.0–0.5)
Eosinophils Relative: 1 %
HCT: 42.1 % (ref 39.0–52.0)
Hemoglobin: 13.7 g/dL (ref 13.0–17.0)
Immature Granulocytes: 1 %
LYMPHS ABS: 0.8 10*3/uL (ref 0.7–4.0)
Lymphocytes Relative: 11 %
MCH: 29.1 pg (ref 26.0–34.0)
MCHC: 32.5 g/dL (ref 30.0–36.0)
MCV: 89.4 fL (ref 80.0–100.0)
Monocytes Absolute: 0.4 10*3/uL (ref 0.1–1.0)
Monocytes Relative: 6 %
Neutro Abs: 6 10*3/uL (ref 1.7–7.7)
Neutrophils Relative %: 81 %
Platelets: 180 10*3/uL (ref 150–400)
RBC: 4.71 MIL/uL (ref 4.22–5.81)
RDW: 14.1 % (ref 11.5–15.5)
WBC: 7.3 10*3/uL (ref 4.0–10.5)
nRBC: 0 % (ref 0.0–0.2)

## 2018-02-05 LAB — COMPREHENSIVE METABOLIC PANEL
ALBUMIN: 3 g/dL — AB (ref 3.5–5.0)
ALT: 12 U/L (ref 0–44)
AST: 16 U/L (ref 15–41)
Alkaline Phosphatase: 68 U/L (ref 38–126)
Anion gap: 6 (ref 5–15)
BUN: 28 mg/dL — ABNORMAL HIGH (ref 8–23)
CHLORIDE: 106 mmol/L (ref 98–111)
CO2: 25 mmol/L (ref 22–32)
Calcium: 8.9 mg/dL (ref 8.9–10.3)
Creatinine, Ser: 1.47 mg/dL — ABNORMAL HIGH (ref 0.61–1.24)
GFR calc Af Amer: 59 mL/min — ABNORMAL LOW (ref >60)
GFR calc non Af Amer: 51 mL/min — ABNORMAL LOW (ref >60)
Glucose, Bld: 139 mg/dL — ABNORMAL HIGH (ref 70–99)
Potassium: 4.7 mmol/L (ref 3.5–5.1)
Sodium: 137 mmol/L (ref 135–145)
Total Bilirubin: 0.7 mg/dL (ref 0.3–1.2)
Total Protein: 6.5 g/dL (ref 6.5–8.1)

## 2018-02-05 LAB — TROPONIN I
Troponin I: 0.91 ng/mL (ref <0.03)
Troponin I: 1.54 ng/mL (ref <0.03)

## 2018-02-05 LAB — I-STAT TROPONIN, ED: Troponin i, poc: 0.06 ng/mL (ref 0.00–0.08)

## 2018-02-05 LAB — HEMOGLOBIN A1C
Hgb A1c MFr Bld: 5.2 % (ref 4.8–5.6)
Mean Plasma Glucose: 102.54 mg/dL

## 2018-02-05 MED ORDER — ASPIRIN EC 81 MG PO TBEC
81.0000 mg | DELAYED_RELEASE_TABLET | Freq: Every day | ORAL | Status: DC
  Administered 2018-02-05 – 2018-02-08 (×4): 81 mg via ORAL
  Filled 2018-02-05 (×3): qty 1

## 2018-02-05 MED ORDER — HEPARIN (PORCINE) 25000 UT/250ML-% IV SOLN
1400.0000 [IU]/h | INTRAVENOUS | Status: DC
  Administered 2018-02-05: 1600 [IU]/h via INTRAVENOUS
  Administered 2018-02-06 – 2018-02-07 (×2): 1400 [IU]/h via INTRAVENOUS
  Filled 2018-02-05 (×3): qty 250

## 2018-02-05 MED ORDER — HEPARIN BOLUS VIA INFUSION
4000.0000 [IU] | Freq: Once | INTRAVENOUS | Status: AC
  Administered 2018-02-05: 4000 [IU] via INTRAVENOUS
  Filled 2018-02-05: qty 4000

## 2018-02-05 MED ORDER — CARVEDILOL 25 MG PO TABS
25.0000 mg | ORAL_TABLET | Freq: Two times a day (BID) | ORAL | Status: DC
  Administered 2018-02-05 – 2018-02-08 (×6): 25 mg via ORAL
  Filled 2018-02-05 (×7): qty 1

## 2018-02-05 MED ORDER — AMLODIPINE BESYLATE 10 MG PO TABS
10.0000 mg | ORAL_TABLET | Freq: Every day | ORAL | Status: DC
  Administered 2018-02-06 – 2018-02-08 (×3): 10 mg via ORAL
  Filled 2018-02-05 (×3): qty 1

## 2018-02-05 MED ORDER — ATORVASTATIN CALCIUM 80 MG PO TABS
80.0000 mg | ORAL_TABLET | Freq: Every day | ORAL | Status: DC
  Administered 2018-02-05 – 2018-02-08 (×4): 80 mg via ORAL
  Filled 2018-02-05 (×3): qty 1

## 2018-02-05 MED ORDER — NITROGLYCERIN 0.4 MG SL SUBL
0.4000 mg | SUBLINGUAL_TABLET | SUBLINGUAL | Status: DC | PRN
  Administered 2018-02-05: 0.4 mg via SUBLINGUAL
  Filled 2018-02-05: qty 1

## 2018-02-05 MED ORDER — LABETALOL HCL 5 MG/ML IV SOLN
20.0000 mg | Freq: Once | INTRAVENOUS | Status: AC
  Administered 2018-02-05: 20 mg via INTRAVENOUS
  Filled 2018-02-05: qty 4

## 2018-02-05 MED ORDER — NITROGLYCERIN IN D5W 200-5 MCG/ML-% IV SOLN
2.0000 ug/min | INTRAVENOUS | Status: DC
  Administered 2018-02-05 – 2018-02-06 (×2): 5 ug/min via INTRAVENOUS
  Filled 2018-02-05: qty 250

## 2018-02-05 MED ORDER — HYDRALAZINE HCL 20 MG/ML IJ SOLN
10.0000 mg | Freq: Four times a day (QID) | INTRAMUSCULAR | Status: DC | PRN
  Administered 2018-02-05 – 2018-02-07 (×2): 10 mg via INTRAVENOUS
  Filled 2018-02-05 (×3): qty 1

## 2018-02-05 NOTE — ED Triage Notes (Signed)
[facility] reports pt [facility] home, with [facility] beginning at 2:30 pm. Pt took 4 81 mg ASA and was given I NTG by ems. CP 8/10 down to 0/10. Pt has not been taking meds for 3 mths and cannot afford medications. HTN 234/183 194/139 hr 100, cbgt 231 96% RA 20 RR

## 2018-02-05 NOTE — ED Notes (Signed)
Pt notified of the need for urine

## 2018-02-05 NOTE — H&P (Addendum)
Cardiology H&P:   Patient ID: Darrell [name] MRN: [id number]; DOB: Jan 16, 1956  Admit date: 02/05/2018 Date of Consult: 02/05/2018  Primary Care Provider: [name], MD Primary Cardiologist: Darrell Breeding, MD - never seen in clinic Primary Electrophysiologist:  None    Patient Profile:   Darrell [name] is a 63 y.o. male with a hx of h/o CAD s/p prior CABG and multiple interventions, HTN, HL, DM, and long h/o noncompliance, mainly due to inability to afford meds, who is being seen today for the evaluation of chest pain, in the setting of hypertensive urgency, at the request of Darrell [name], Emergency Medicine.   History of Present Illness:   Darrell [name] is a 63 y.o. male with a hx of h/o CAD s/p prior CABG and multiple interventions, HTN, HL, DM, and long h/o noncompliance, mainly due to inability to afford meds, who is being seen today for the evaluation of chest pain, in the setting of hypertensive urgency, at the request of Darrell [name], Emergency Medicine.   We have seen this patient multiple times in the hospital for chest pain, NSTEMI's and hypertensive urgency's secondary to noncompliance.  He has never followed up in clinic.  He reports that he is mainly noncompliant with his medications due to inability to afford them.  He states that he has been off of most of his medications for the last 3 months.  He denies tobacco use.  Last cardiac catheterization was in March 2017.  At that time, he had a patent radial graft to an obtuse marginal branch and a patent LIMA to the mid LAD.  The distal LAD was diffusely disease.  There were left-to-right collaterals.  The native right coronary artery was also occluded as well as the graft to the PDA and PLA.  He was noted to have high-grade in-stent restenosis in the proximal codominant circumflex, which was felt to be the culprit lesion at that time for his non-STEMI.  The proximal circumflex was stented however the distal circumflex was  treated medically.  Last echo was in 2018.  This showed normal left ventricular EF.  The patient has not been seen since 2018.  He presents to the [facility] ED with complaint of chest pain.  He was brought in by EMS after his wife called 911.  Patient reports that he was in his usual state of health until earlier today around 2:30 PM.  He was sitting on the sofa watching TV when he developed sudden onset substernal chest discomfort, which felt like indigestion.  The patient tells me that this was different than his prior anginal symptoms which was more of a pressure-like sensation.  The pain did not radiate.  It was intense.  No associated dyspnea, nausea, vomiting or diaphoresis.  When EMS arrived, they checked his blood pressure and it was noted to be severely elevated in the 893T systolic.  He was given sublingual nitroglycerin with complete resolution of his chest pain.  He was brought into the ED for further evaluation.  Initial blood pressure in the ED was 219/117.  He was given IV labetalol and blood pressure has improved slightly but still remains elevated at 198/106.  He denies any recurrence of chest pain in the ED.  He reports that he is feeling better and he wants to go home.  His EKG shows sinus rhythm with left atrial enlargement and old anterior infarct.  There are borderline inferior T wave abnormalities.  Labs are notable for normal CBC.  Basic metabolic panel shows normal potassium at 4.7.  Renal function is abnormal.  Serum creatinine is 1.47 and BUN is 28.  Glucose also elevated at 139. POC troponin is negative.   We discussed his history of noncompliance with medications.  He acknowledges that he needs to be more compliant.  He reports that he spoke with his son earlier today and his son has promised to help him with medications.  He states that his son will go to the pharmacy tonight to pick up whatever medications he needs.  Prior home meds included amlodipine, aspirin, metformin,  Lipitor, carvedilol, Plavix, Imdur and losartan.   Past Medical History:  Diagnosis Date  . Claudication ([place])   . Coronary artery disease    a. 08/2007 s/p DES to RCA/RI;  b. 07/2006 s/p DES to p/d LCX;  c. 11/2007 CABGx5 (LIMA->LAD, VG->D1, LRA->OM1, VG->PDA->LPL);  d. 09/2012 STEMI/Cath: 3VD, LIMA->LAD nl (LAD 95d), RA->OM nl, VG->PDA->LPL 100, unable to locate VG->Diag, EF 60-65%->Med Rx. e. 03/2015: Synergy DES to mid-Cx in the setting of NSTEMI; f. 11/2015 NSTEMI->Med Rx.  . Diabetes mellitus without complication ([place])    a. A1c 7.0 09/2012.  . Diastolic dysfunction    a. 02/2016 Echo: EF 50-55%, gr1DD, Ao sclerosis, dil Ao root (46m), sev dil LA, triv TR, PASP 294mg.  . Marland Kitchenyperlipidemia   . Hypertension   . Noncompliance     Past Surgical History:  Procedure Laterality Date  . CARDIAC CATHETERIZATION N/A 04/06/2015   Procedure: Left Heart Cath and Cors/Grafts Angiography;  Surgeon: JoLorretta HarpMD;  [facility];  Service: Cardiovascular;  Laterality: N/A;  . CARDIAC CATHETERIZATION N/A 04/06/2015   Procedure: Coronary Stent Intervention;  Surgeon: JoLorretta HarpMD;  2.5 mm x 12 mm long Synergy DES followed by  2.5 mm x 16 mm long Synergy DES    . CORONARY ARTERY BYPASS GRAFT     2009 LIMA to LAD, SVG to Diag, SVG to PDA and PL, left radial to OM  . LEFT HEART CATH N/A 10/24/2012   Procedure: LEFT HEART CATH;  Surgeon: ThTroy SineMD;  [facility];  Service: Cardiovascular;  Laterality: N/A;     Home Medications:  Prior to Admission medications   Medication Sig Start Date End Date Taking? Authorizing Provider  amLODipine (NORVASC) 10 MG tablet Take 1 tablet (10 mg total) by mouth daily. 07/05/16  Yes NeCharlott RakesMD  aspirin 81 MG EC tablet Take 1 tablet (81 mg total) by mouth daily. 03/12/16  Yes NeCharlott RakesMD  metFORMIN (GLUCOPHAGE) 500 MG tablet Take 1 tablet (500 mg total) by mouth 2 (two) times daily with a meal. 07/05/16  Yes Newlin,  Enobong, MD  amLODipine (NORVASC) 10 MG tablet Take 1 tablet (10 mg total) by mouth daily. Patient not taking: Reported on 02/05/2018 03/30/17   StLajean SaverMD  atorvastatin (LIPITOR) 80 MG tablet Take 1 tablet (80 mg total) by mouth daily. Patient not taking: Reported on 02/05/2018 03/12/16   NeCharlott RakesMD  Blood Glucose Monitoring Suppl (TRUE METRIX METER) w/Device KIT USE AS INSTRUCTED 07/05/16   NeCharlott RakesMD  carvedilol (COREG) 25 MG tablet Take 1 tablet (25 mg total) by mouth 2 (two) times daily with a meal. Patient not taking: Reported on 02/05/2018 07/05/16   NeCharlott RakesMD  carvedilol (COREG) 25 MG tablet Take 1 tablet (25 mg total) by mouth 2 (two) times daily with a meal. Patient not taking: Reported on  02/05/2018 03/30/17   Lajean Saver, MD  clopidogrel (PLAVIX) 75 MG tablet Take 1 tablet (75 mg total) by mouth daily. Patient not taking: Reported on 02/05/2018 07/05/16   Darrell Rakes, MD  glucose blood (TRUE METRIX BLOOD GLUCOSE TEST) test strip Use as instructed 07/05/16   Darrell Rakes, MD  isosorbide mononitrate (IMDUR) 60 MG 24 hr tablet Take 1 tablet (60 mg total) by mouth daily. Patient not taking: Reported on 02/05/2018 07/05/16   Darrell Rakes, MD  Lancet Devices [facility]) lancets Use as instructed daily. 07/05/16   Darrell Rakes, MD  losartan (COZAAR) 25 MG tablet Take 1 tablet (25 mg total) by mouth daily. Patient not taking: Reported on 02/05/2018 07/05/16   Darrell Rakes, MD  NITROSTAT 0.4 MG SL tablet PLACE 1 TABLET UNDER THE TONGUE EVERY 5 MINUTES AS NEEDED FOR CHEST PAIN Patient not taking: Reported on 02/05/2018 11/07/16   Darrell Breeding, MD  TRUEPLUS LANCETS 28G MISC USE AS INSTRUCTED 07/05/16   Darrell Rakes, MD    Inpatient Medications: Scheduled Meds:  Continuous Infusions:  PRN Meds:   Allergies:   No Known Allergies  Social History:   Social History   Socioeconomic History  . Marital status: Married    Spouse name: Not on file  .  Number of children: 3  . Years of education: Not on file  . Highest education level: Not on file  Occupational History  . Not on file  Social Needs  . Financial resource strain: Not on file  . Food insecurity:    Worry: Not on file    Inability: Not on file  . Transportation needs:    Medical: Not on file    Non-medical: Not on file  Tobacco Use  . Smoking status: Former Smoker    Last attempt to quit: 10/25/2010    Years since quitting: 7.2  . Smokeless tobacco: Never Used  Substance and Sexual Activity  . Alcohol use: No    Comment: sts he drank for the first time in a long time last night   . Drug use: No  . Sexual activity: Not on file  Lifestyle  . Physical activity:    Days per week: Not on file    Minutes per session: Not on file  . Stress: Not on file  Relationships  . Social connections:    Talks on phone: Not on file    Gets together: Not on file    Attends religious service: Not on file    Active member of club or organization: Not on file    Attends meetings of clubs or organizations: Not on file    Relationship status: Not on file  . Intimate partner violence:    Fear of current or ex partner: Not on file    Emotionally abused: Not on file    Physically abused: Not on file    Forced sexual activity: Not on file  Other Topics Concern  . Not on file  Social History Narrative   Lives at home with wife.      Family History:    Family History  Problem Relation Age of Onset  . Heart failure Mother   . Cancer Mother   . CAD Mother 77  . Heart failure Father   . Kidney failure Brother      ROS:  Please see the history of present illness.   All other ROS reviewed and negative.     Physical Exam/Data:   Vitals:   02/05/18  1540 02/05/18 1545 02/05/18 1550 02/05/18 1555  BP: (!) 200/136 (!) 202/108 (!) 198/106 (!) 198/113  Pulse: 85 85 82 86  Resp: (!) 7     Temp:      TempSrc:      SpO2: 96% 96% 95% 95%  Weight:      Height:       No intake  or output data in the 24 hours ending 02/05/18 1610 Last 3 Weights 02/05/2018 03/30/2017 11/09/2016  Weight (lbs) 234 lb 203 lb 204 lb  Weight (kg) 106.142 kg 92.08 kg 92.534 kg     Body mass index is 30.87 kg/m.  General: Disheveled appearing middle aged WM, malodorous  HEENT: normal Lymph: no adenopathy Neck: no JVD Endocrine:  No thryomegaly Vascular: No carotid bruits; FA pulses 2+ bilaterally without bruits  Cardiac:  normal S1, S2; RRR; no murmur  Lungs:  clear to auscultation bilaterally, no wheezing, rhonchi or rales  Abd: soft, nontender, no hepatomegaly  Ext: no edema Musculoskeletal:  No deformities, BUE and BLE strength normal and equal Skin: warm and dry  Neuro:  CNs 2-12 intact, no focal abnormalities noted Psych:  Normal affect   EKG:  The EKG was personally reviewed and demonstrates:  sinus rhythm with left atrial enlargement and old anterior infarct.  There are borderline inferior T wave abnormalities.  Telemetry:  Telemetry was personally reviewed and demonstrates:  NSR  Relevant CV Studies:  [facility] 2017 Left Heart Cath and Cors/Grafts Angiography  Conclusion    Ost LAD to Mid LAD lesion, 100% stenosed. The lesion was previously treated with a stent (unknown type).  Mid RCA lesion, 100% stenosed. The lesion was previously treated with a stent (unknown type).  Dist Cx-1 lesion, 95% stenosed. The lesion was previously treated with a stent (unknown type).  Dist Cx-2 lesion, 95% stenosed.  Origin to Prox Graft lesion, 100% stenosed.  Dist LAD lesion, 95% stenosed.  Ost Cx to Mid Cx lesion, 95% stenosed. Post intervention, there is a 0% residual stenosis. The lesion was previously treated with a stent (unknown type).  There is mild left ventricular systolic dysfunction.    Coronary Diagrams   Diagnostic  Dominance: Co-dominant    Intervention      2D Echo 2018 Study Conclusions  - Left ventricle: The cavity size was normal. There was moderate    concentric hypertrophy. Systolic function was normal. The   estimated ejection fraction was in the range of 50% to 55%.   Inferior hypokinesis. Doppler parameters are consistent with   abnormal left ventricular relaxation (grade 1 diastolic   dysfunction). The E/e&' ratio is between 8-15, suggesting   indeterminate LV filling pressure. - Aortic valve: Trileaflet. Sclerosis without stenosis. There was   no regurgitation. - Aorta: Aortic root dimension: 42 mm (ED). - Aortic root: The aortic root is dilated. - Mitral valve: Mildly thickened leaflets . There was trivial   regurgitation. - Left atrium: Severely dilated. - Right ventricle: The cavity size was mildly dilated. Systolic   function was normal. - Right atrium: The atrium was mildly dilated. - Tricuspid valve: There was trivial regurgitation. - Pulmonary arteries: PA peak pressure: 22 mm Hg (S). - Inferior vena cava: The vessel was normal in size. The   respirophasic diameter changes were in the normal range (= 50%),   consistent with normal central venous pressure.  Impressions:  - Compared to a prior study in 03/2015, there are no significant   changes.   Laboratory Data:  ChemistryNo  results for input(s): NA, K, CL, CO2, GLUCOSE, BUN, CREATININE, CALCIUM, GFRNONAA, GFRAA, ANIONGAP in the last 168 hours.  No results for input(s): PROT, ALBUMIN, AST, ALT, ALKPHOS, BILITOT in the last 168 hours. Hematology Recent Labs  Lab 02/05/18 1528  WBC 7.3  RBC 4.71  HGB 13.7  HCT 42.1  MCV 89.4  MCH 29.1  MCHC 32.5  RDW 14.1  PLT 180   Cardiac EnzymesNo results for input(s): TROPONINI in the last 168 hours.  Recent Labs  Lab 02/05/18 1543  TROPIPOC 0.06    BNPNo results for input(s): BNP, PROBNP in the last 168 hours.  DDimer No results for input(s): DDIMER in the last 168 hours.  Radiology/Studies:  No results found.  Assessment and Plan:   Darrell [name] is a 63 y.o. male with a hx of h/o CAD s/p prior  CABG and multiple interventions, HTN, HL, DM, and long h/o noncompliance, mainly due to inability to afford meds, who is being seen today for the evaluation of chest pain, in the setting of hypertensive urgency, at the request of Darrell [name], Emergency Medicine.   1.  Chest pain: Chest pain in the setting of market hypertension/hypertensive urgency with systolic pressures in the 010X and diastolic pressures in the 100s.  This is all in the setting of medication noncompliance.  Patient had sudden onset substernal chest pain which felt like indigestion and per patient report is different from his prior angina which felt more like pressure.  He had complete resolution of chest pain after EMS gave him sublingual nitroglycerin.  He denies any recurrence of chest pain since he took the nitroglycerin.  He was also given IV labetalol in the ED.  Blood pressure is mildly improved but still remains elevated.  Point-of-care troponin is negative.  His EKG shows sinus rhythm with left atrial enlargement and old anterior infarct as well as borderline inferior T wave abnormalities.  Labs are notable for mild renal insufficiency with serum creatinine at 1.42 c/w prior levels.  Given he is completely chest pain-free and feeling better, the patient desires to go home if it all possible tonight, and vows to improve compliance with medications.  Will defer to MD.  2.  Hypertensive Urgency: in the setting of medication noncompliance.  Patient was given sublingual nitroglycerin and IV labetalol.  Blood pressure remains elevated but mildly improved.  We had a long discussion regarding the importance of strict medication compliance given his cardiac history.  He reports inability in the past to afford medications due to financial restraints.  However he reports that he spoke with his son earlier today who vows to help him with his medication and is willing to go to the pharmacy tonight to pick up any medications that may be  prescribed. - Given plans to admit tonight, we will resume home meds. Monitor BP closely.   3.  CAD: H/o coronary artery bypass grafting and multiple stenting in the past as outlined above.  Last intervention was in 2017 with PCI to the proximal circumflex.  His chest pain today was in the setting of hypertensive urgency, which has completely resolved with sublingual nitroglycerin.  Prior to today's events, he denies any other recent anginal symptomatology including no exertional chest pain or dyspnea.  We will try to resume home medications, however patient does have a long history of medication noncompliance. He reports that he has been fully compliant w/ ASA daily.  4.  Medication noncompliance: issues with compliance with medications due to cost.  We discussed getting medications filled at [facility].  I reviewed his prior home medication list.  He can get 30-day supplies of Coreg and Imdur for $4 each and 30-day supplies of amlodipine and losartan for $9 each.  5. Renal Insuffiencey: SCr 1.47 on admit. Last documented SCr was 1.06 in March 2019, but he has had SCr in the 1.4 range in the past. Will hold losartan. F/u BMP in the AM.    Discussed w/ MD. Given his BP remains severely elevated, we will plan to admit for BP control and observation. Will cycle cardiac enzymes x 3. Possible d/c in the AM if he rules out and if BP is better controlled.   For questions or updates, please contact [place] Please consult [email or web address] for contact info under     Signed, Lyda Jester, PA-C  02/05/2018 4:10 PM As above, patient seen and examined.  Briefly he is a 63 year old male with past medical history of coronary artery disease status post coronary artery bypass and graft, hypertension, hyperlipidemia, diabetes mellitus, chronic stage III kidney disease, long history of noncompliance admitted with chest pain and hypertensive urgency.  Patient has not been taking his medications in the past 1  week.  Today at 230 he developed substernal chest pain described as a burning sensation.  No radiation or associated symptoms.  Pain was not pleuritic or positional.  Lasted 45 minutes to 1 hour and resolved with nitroglycerin.  Blood pressure significantly elevated in the emergency room.  Blood pressure at time of my evaluation 210/120.  Laboratory significant for creatinine 1.47.  Troponin 0 0.06.  Electrocardiogram shows sinus rhythm, septal infarct, nonspecific ST changes.  1 chest pain-symptoms somewhat atypical.  He feels related to elevated blood pressure and not taking medications.  We will cycle enzymes.  If negative we will plan discharge with outpatient functional study for risk stratification.  2 hypertensive urgency-patient has not taken medications in 1 week.  Will resume amlodipine, carvedilol and isosorbide.  Follow blood pressure and adjust medications as needed.  Will resume losartan tomorrow morning if needed.  3 coronary artery disease-previous coronary artery bypass and graft.  Resume aspirin and statin.  4 noncompliance-patient educated on importance of compliant with medical therapy.  5 chronic stage III kidney disease-follow renal function while in-house.  Kirk Ruths, MD

## 2018-02-05 NOTE — ED Notes (Signed)
Cards at bedside

## 2018-02-05 NOTE — ED Notes (Signed)
Pt returned from xray

## 2018-02-05 NOTE — Consult Note (Deleted)
  Entered in error. See H&P note   

## 2018-02-05 NOTE — ED Notes (Signed)
Report bp trend to Dr Rush Landmark. He will order more labetalol

## 2018-02-05 NOTE — ED Notes (Signed)
Paged Dr.Crenshaw for PRN bp med per accepting RN request. VO for hydralazine.

## 2018-02-05 NOTE — ED Provider Notes (Signed)
[facility] EMERGENCY DEPARTMENT Provider Note   CSN: [id number] Arrival date & time: 02/05/18  1500     History   Chief Complaint Chief Complaint  Patient presents with  . Chest Pain    HPI Darrell [name] is a 63 y.o. male.  The history is provided by the patient and medical records. No language interpreter was used.  Chest Pain  Pain location:  Substernal area and L chest Pain quality: aching, crushing, pressure and sharp   Pain radiates to:  Does not radiate Pain severity:  Severe Onset quality:  Gradual Timing:  Constant Progression:  Waxing and waning Chronicity:  New Relieved by:  Nothing Worsened by:  Nothing Ineffective treatments:  None tried Associated symptoms: shortness of breath   Associated symptoms: no back pain, no cough, no diaphoresis, no fatigue, no fever, no headache, no nausea, no palpitations and no vomiting   Risk factors: coronary artery disease     Past Medical History:  Diagnosis Date  . Claudication ([place])   . Coronary artery disease    a. 08/2007 s/p DES to RCA/RI;  b. 07/2006 s/p DES to p/d LCX;  c. 11/2007 CABGx5 (LIMA->LAD, VG->D1, LRA->OM1, VG->PDA->LPL);  d. 09/2012 STEMI/Cath: 3VD, LIMA->LAD nl (LAD 95d), RA->OM nl, VG->PDA->LPL 100, unable to locate VG->Diag, EF 60-65%->Med Rx. e. 03/2015: Synergy DES to mid-Cx in the setting of NSTEMI; f. 11/2015 NSTEMI->Med Rx.  . Diabetes mellitus without complication ([place])    a. A1c 7.0 09/2012.  . Diastolic dysfunction    a. 02/2016 Echo: EF 50-55%, gr1DD, Ao sclerosis, dil Ao root (66m), sev dil LA, triv TR, PASP 258mg.  . Marland Kitchenyperlipidemia   . Hypertension   . Noncompliance     Patient Active Problem List   Diagnosis Date Noted  . Sepsis ([place]/03/2016  . Chest pain   . Hyponatremia 01/03/2016  . Hypertensive emergency   . NSTEMI (non-ST elevated myocardial infarction) ([facility] [facility]/07/2015  . HTN (hypertension) 04/04/2015  . Hypertensive urgency 03/25/2014  .  Noncompliance 03/25/2014  . Claudication ([facility]10/01/2012  . GERD 10/23/2007  . NEPHROLITHIASIS 10/23/2007  . HYPERCHOLESTEROLEMIA 10/22/2007  . ADJUSTMENT DISORDER WITH DEPRESSED MOOD 10/22/2007  . Type 2 diabetes mellitus without complication, without long-term current use of insulin ([facility]01/01/2003  . Coronary atherosclerosis, s/p CABG in 2009  01/28/2001    Past Surgical History:  Procedure Laterality Date  . CARDIAC CATHETERIZATION N/A 04/06/2015   Procedure: Left Heart Cath and Cors/Grafts Angiography;  Surgeon: JoLorretta HarpMD;  [facility];  Service: Cardiovascular;  Laterality: N/A;  . CARDIAC CATHETERIZATION N/A 04/06/2015   Procedure: Coronary Stent Intervention;  Surgeon: JoLorretta HarpMD;  2.5 mm x 12 mm long Synergy DES followed by  2.5 mm x 16 mm long Synergy DES    . CORONARY ARTERY BYPASS GRAFT     2009 LIMA to LAD, SVG to Diag, SVG to PDA and PL, left radial to OM  . LEFT HEART CATH N/A 10/24/2012   Procedure: LEFT HEART CATH;  Surgeon: ThTroy SineMD;  [facility];  Service: Cardiovascular;  Laterality: N/A;        Home Medications    Prior to Admission medications   Medication Sig Start Date End Date Taking? Authorizing Provider  amLODipine (NORVASC) 10 MG tablet Take 1 tablet (10 mg total) by mouth daily. 07/05/16   NeCharlott RakesMD  amLODipine (NORVASC) 10 MG tablet Take 1 tablet (10 mg total) by  mouth daily. 03/30/17   Lajean Saver, MD  aspirin 81 MG EC tablet Take 1 tablet (81 mg total) by mouth daily. 03/12/16   [name], MD  atorvastatin (LIPITOR) 80 MG tablet Take 1 tablet (80 mg total) by mouth daily. 03/12/16   [name], MD  Blood Glucose Monitoring Suppl (TRUE METRIX METER) w/Device KIT USE AS INSTRUCTED 07/05/16   [name], MD  carvedilol (COREG) 25 MG tablet Take 1 tablet (25 mg total) by mouth 2 (two) times daily with a meal. 07/05/16   [name], MD  carvedilol (COREG) 25 MG tablet Take 1  tablet (25 mg total) by mouth 2 (two) times daily with a meal. 03/30/17   Lajean Saver, MD  clopidogrel (PLAVIX) 75 MG tablet Take 1 tablet (75 mg total) by mouth daily. 07/05/16   [name], MD  glucose blood (TRUE METRIX BLOOD GLUCOSE TEST) test strip Use as instructed 07/05/16   [name], MD  isosorbide mononitrate (IMDUR) 60 MG 24 hr tablet Take 1 tablet (60 mg total) by mouth daily. 07/05/16   [name], MD  Lancet Devices [facility]) lancets Use as instructed daily. 07/05/16   [name], MD  losartan (COZAAR) 25 MG tablet Take 1 tablet (25 mg total) by mouth daily. 07/05/16   [name], MD  metFORMIN (GLUCOPHAGE) 500 MG tablet Take 1 tablet (500 mg total) by mouth 2 (two) times daily with a meal. 07/05/16   Newlin, Enobong, MD  NITROSTAT 0.4 MG SL tablet PLACE 1 TABLET UNDER THE TONGUE EVERY 5 MINUTES AS NEEDED FOR CHEST PAIN 11/07/16   Minus Breeding, MD  TRUEPLUS LANCETS 28G MISC USE AS INSTRUCTED 07/05/16   [name], MD    Family History Family History  Problem Relation Age of Onset  . Heart failure Mother   . Cancer Mother   . CAD Mother 87  . Heart failure Father   . Kidney failure Brother     Social History Social History   Tobacco Use  . Smoking status: Former Smoker    Last attempt to quit: 10/25/2010    Years since quitting: 7.2  . Smokeless tobacco: Never Used  Substance Use Topics  . Alcohol use: No    Comment: sts he drank for the first time in a long time last night   . Drug use: No     Allergies   Patient has no known allergies.   Review of Systems Review of Systems  Constitutional: Negative for chills, diaphoresis, fatigue and fever.  HENT: Negative for congestion.   Respiratory: Positive for shortness of breath. Negative for cough, chest tightness, wheezing and stridor.   Cardiovascular: Positive for chest pain. Negative for palpitations and leg swelling.  Gastrointestinal: Negative for constipation, diarrhea, nausea  and vomiting.  Genitourinary: Negative for flank pain.  Musculoskeletal: Negative for back pain, neck pain and neck stiffness.  Neurological: Negative for light-headedness and headaches.  Psychiatric/Behavioral: Negative for agitation.  All other systems reviewed and are negative.    Physical Exam Updated Vital Signs BP (!) 214/128 (BP Location: Left Arm)   Pulse 90   Temp 98.1 F (36.7 C) (Oral)   Resp 14   Ht 6' 1" (1.854 m)   Wt 106.1 kg   SpO2 96%   BMI 30.87 kg/m   Physical Exam Constitutional:      General: He is not in acute distress.    Appearance: He is well-developed. He is not ill-appearing, toxic-appearing or diaphoretic.  HENT:  Head: Normocephalic and atraumatic.     Right Ear: External ear normal.     Left Ear: External ear normal.     Nose: Nose normal.     Mouth/Throat:     Pharynx: No oropharyngeal exudate.  Eyes:     Conjunctiva/sclera: Conjunctivae normal.     Pupils: Pupils are equal, round, and reactive to light.  Neck:     Musculoskeletal: Normal range of motion and neck supple.  Cardiovascular:     Rate and Rhythm: Normal rate.     Heart sounds: Heart sounds not distant.  Pulmonary:     Effort: No tachypnea or respiratory distress.     Breath sounds: No stridor. No decreased breath sounds, wheezing, rhonchi or rales.  Abdominal:     Palpations: Abdomen is soft.     Tenderness: There is no abdominal tenderness. There is no guarding or rebound.  Musculoskeletal: Normal range of motion.     Right lower leg: He exhibits no tenderness. Edema (mild) present.     Left lower leg: He exhibits no tenderness. Edema (mild) present.  Skin:    General: Skin is warm.     Coloration: Skin is not cyanotic.     Findings: No ecchymosis, erythema or rash.  Neurological:     General: No focal deficit present.     Mental Status: He is alert and oriented to person, place, and time.     Cranial Nerves: No cranial nerve deficit.     Motor: No abnormal  muscle tone.     Coordination: Coordination normal.     Deep Tendon Reflexes: Reflexes normal.      ED Treatments / Results  Labs (all labs ordered are listed, but only abnormal results are displayed) Labs Reviewed  COMPREHENSIVE METABOLIC PANEL - Abnormal; Notable for the following components:      Result Value   Glucose, Bld 139 (*)    BUN 28 (*)    Creatinine, Ser 1.47 (*)    Albumin 3.0 (*)    GFR calc non Af Amer 51 (*)    GFR calc Af Amer 59 (*)    All other components within normal limits  URINALYSIS, ROUTINE W REFLEX MICROSCOPIC - Abnormal; Notable for the following components:   Glucose, UA 50 (*)    Hgb urine dipstick SMALL (*)    Protein, ur 100 (*)    Bacteria, UA RARE (*)    All other components within normal limits  TROPONIN I - Abnormal; Notable for the following components:   Troponin I 0.91 (*)    All other components within normal limits  TROPONIN I - Abnormal; Notable for the following components:   Troponin I 1.54 (*)    All other components within normal limits  CBC WITH DIFFERENTIAL/PLATELET  HEMOGLOBIN A1C  HIV ANTIBODY (ROUTINE TESTING W REFLEX)  TROPONIN I  BASIC METABOLIC PANEL  CBC  HEPARIN LEVEL (UNFRACTIONATED)  I-STAT TROPONIN, ED  I-STAT TROPONIN, ED    EKG EKG Interpretation  Date/Time:  Thursday February 05 2018 15:00:36 EST Ventricular Rate:  94 PR Interval:    QRS Duration: 114 QT Interval:  382 QTC Calculation: 478 R Axis:   81 Text Interpretation:  Sinus rhythm Left atrial enlargement Anterior infarct, old Borderline T abnormalities, inferior leads When comapred to prior, worsened  ST elevation in leads V1-V3. Similar t wave invetrsion in lead 3.  No STEMI Confirmed by Antony Blackbird [phone]) on 02/05/2018 3:14:21 PM   Radiology Dg Chest 2 View  Result Date: 02/05/2018 CLINICAL DATA:  Central chest pain EXAM: CHEST - 2 VIEW COMPARISON:  03/30/2017 FINDINGS: The heart is mildly enlarged. Normal pulmonary vascularity. Clear  lungs. No pneumothorax or pleural effusion. IMPRESSION: Cardiomegaly without decompensation. Electronically Signed   By: Marybelle Killings M.D.   On: 02/05/2018 16:42    Procedures Procedures (including critical care time)  CRITICAL CARE Performed by: Gwenyth Allegra Londyn Wotton Total critical care time: 35 minutes Critical care time was exclusive of separately billable procedures and treating other patients. Critical care was necessary to treat or prevent imminent or life-threatening deterioration. Critical care was time spent personally by me on the following activities: development of treatment plan with patient and/or surrogate as well as nursing, discussions with consultants, evaluation of patient's response to treatment, examination of patient, obtaining history from patient or surrogate, ordering and performing treatments and interventions, ordering and review of laboratory studies, ordering and review of radiographic studies, pulse oximetry and re-evaluation of patient's condition.   Medications Ordered in ED Medications  aspirin EC tablet 81 mg (81 mg Oral Given 02/05/18 2100)  atorvastatin (LIPITOR) tablet 80 mg (80 mg Oral Given 02/05/18 2100)  carvedilol (COREG) tablet 25 mg (25 mg Oral Given 02/05/18 1947)  nitroGLYCERIN (NITROSTAT) SL tablet 0.4 mg (0.4 mg Sublingual Given 02/05/18 2114)  amLODipine (NORVASC) tablet 10 mg (has no administration in time range)  hydrALAZINE (APRESOLINE) injection 10 mg (10 mg Intravenous Given 02/05/18 1820)  nitroGLYCERIN 50 mg in dextrose 5 % 250 mL (0.2 mg/mL) infusion (0 mcg/min Intravenous Stopped 02/05/18 2118)  heparin ADULT infusion 100 units/mL (25000 units/243m sodium chloride 0.45%) (1,600 Units/hr Intravenous New Bag/Given 02/05/18 2144)  labetalol (NORMODYNE,TRANDATE) injection 20 mg (20 mg Intravenous Given 02/05/18 1534)  labetalol (NORMODYNE,TRANDATE) injection 20 mg (20 mg Intravenous Given 02/05/18 1655)  heparin bolus via infusion 4,000 Units (4,000  Units Intravenous Bolus from Bag 02/05/18 2145)     Initial Impression / Assessment and Plan / ED Course  I have reviewed the triage vital signs and the nursing notes.  Pertinent labs & imaging results that were available during my care of the patient were reviewed by me and considered in my medical decision making (see chart for details).     SLONDON TARNOWSKIis a 63y.o. male with a past medical history significant for CAD status post multiple PCI and CABG, diabetes, hypertension, hypercholesterolemia, and prior hypertensive emergency who presents with chest pain.  Patient reports that at around 2:30 PM he started having central sharp chest pain.  He reports that it felt similar to his prior heart attack.  He has been off of all medications for the last several months because he cannot afford them.  He says that he gets chest pain when his blood pressure is elevated.  According to EMS, his blood pressure was in the 230s on their arrival.  Patient was given 4 of aspirin and nitroglycerin and had resolution of his pain.  Patient is currently chest pain-free.  He reports no diaphoresis, nausea, vomiting, exertion/pleurisy component, palpitations, radiation of pain or trauma.  He reports the pain was 8 out of 10 in severity on onset and is now 0 10.  On exam, patient's lungs are clear.  Chest is nontender.  Back is nontender.  CVA is nontender.  No focal neurologic deficit initial exam.  Legs have some mild edema.  EKG showed some worsened ST elevations from prior and T waves are still inverted from prior.  No STEMI was seen initially.  Patient is still chest pain-free.  Patient likely has hypertensive emergency causing his symptoms as his blood pressure was so elevated with EMS and on arrival.  Patient given labetalol.  Patient will have screen laboratory testing and chest x-ray.  Cardiology was called who will come see patient given his history of pain similar to prior MI.  Anticipate admission.          Cards to admit.   Final Clinical Impressions(s) / ED Diagnoses   Final diagnoses:  Hypertensive emergency  Precordial pain    ED Discharge Orders    None     Clinical Impression: 1. Hypertensive emergency   2. Precordial pain     Disposition: Admit  This note was prepared with assistance of Dragon voice recognition software. Occasional wrong-word or sound-a-like substitutions may have occurred due to the inherent limitations of voice recognition software.      Tegeler, Gwenyth Allegra, MD 02/06/18 [phone]

## 2018-02-05 NOTE — Progress Notes (Signed)
ANTICOAGULATION CONSULT NOTE - Initial Consult  Pharmacy Consult for heparin Indication: chest pain/ACS  No Known Allergies  Patient Measurements: Height: 6\' 1"  (185.4 cm) Weight: 234 lb (106.1 kg) IBW/kg (Calculated) : 79.9 Heparin Dosing Weight: 101kg  Vital Signs: Temp: 98.1 F (36.7 C) (01/09 1508) Temp Source: Oral (01/09 1508) BP: 198/110 (01/09 1845) Pulse Rate: 88 (01/09 1845)  Labs: Recent Labs    02/05/18 1528 02/05/18 1838  HGB 13.7  --   HCT 42.1  --   PLT 180  --   CREATININE 1.47*  --   TROPONINI  --  0.91*    Estimated Creatinine Clearance: 67.5 mL/min (A) (by C-G formula based on SCr of 1.47 mg/dL (H)).   Medical History: Past Medical History:  Diagnosis Date  . Claudication ([facility])   . Coronary artery disease    a. 08/2007 s/p DES to RCA/RI;  b. 07/2006 s/p DES to p/d LCX;  c. 11/2007 CABGx5 (LIMA->LAD, VG->D1, LRA->OM1, VG->PDA->LPL);  d. 09/2012 STEMI/Cath: 3VD, LIMA->LAD nl (LAD 95d), RA->OM nl, VG->PDA->LPL 100, unable to locate VG->Diag, EF 60-65%->Med Rx. e. 03/2015: Synergy DES to mid-Cx in the setting of NSTEMI; f. 11/2015 NSTEMI->Med Rx.  . Diabetes mellitus without complication ([facility])    a. A1c 7.0 09/2012.  . Diastolic dysfunction    a. 02/2016 Echo: EF 50-55%, gr1DD, Ao sclerosis, dil Ao root (54mm), sev dil LA, triv TR, PASP .  Marland Kitchen Hyperlipidemia   . Hypertension   . Noncompliance    Assessment: 63 year old male being admitted for chest pain and hypertensive urgency d/t medication noncompliance.   CE's now elevated, new orders to start IV heparin. CBC within normal limits.   Goal of Therapy:  Heparin level 0.3-0.7 units/ml Monitor platelets by anticoagulation protocol: Yes   Plan:  Give 4000 units bolus x 1 Start heparin infusion at 1600 units/hr Check anti-Xa level in 6 hours and daily while on heparin Continue to monitor H&H and platelets  Sheppard Coil PharmD., BCPS Clinical Pharmacist 02/05/2018 8:59 PM

## 2018-02-06 ENCOUNTER — Observation Stay (HOSPITAL_COMMUNITY): Payer: Medicare Other

## 2018-02-06 DIAGNOSIS — R0989 Other specified symptoms and signs involving the circulatory and respiratory systems: Secondary | ICD-10-CM | POA: Diagnosis present

## 2018-02-06 DIAGNOSIS — Z7982 Long term (current) use of aspirin: Secondary | ICD-10-CM | POA: Diagnosis not present

## 2018-02-06 DIAGNOSIS — R072 Precordial pain: Secondary | ICD-10-CM

## 2018-02-06 DIAGNOSIS — Z9114 Patient's other noncompliance with medication regimen: Secondary | ICD-10-CM | POA: Diagnosis not present

## 2018-02-06 DIAGNOSIS — E7800 Pure hypercholesterolemia, unspecified: Secondary | ICD-10-CM | POA: Diagnosis present

## 2018-02-06 DIAGNOSIS — Z87891 Personal history of nicotine dependence: Secondary | ICD-10-CM | POA: Diagnosis not present

## 2018-02-06 DIAGNOSIS — E785 Hyperlipidemia, unspecified: Secondary | ICD-10-CM | POA: Diagnosis present

## 2018-02-06 DIAGNOSIS — Z599 Problem related to housing and economic circumstances, unspecified: Secondary | ICD-10-CM | POA: Diagnosis not present

## 2018-02-06 DIAGNOSIS — Z951 Presence of aortocoronary bypass graft: Secondary | ICD-10-CM | POA: Diagnosis not present

## 2018-02-06 DIAGNOSIS — I129 Hypertensive chronic kidney disease with stage 1 through stage 4 chronic kidney disease, or unspecified chronic kidney disease: Secondary | ICD-10-CM | POA: Diagnosis present

## 2018-02-06 DIAGNOSIS — Z8249 Family history of ischemic heart disease and other diseases of the circulatory system: Secondary | ICD-10-CM | POA: Diagnosis not present

## 2018-02-06 DIAGNOSIS — E1122 Type 2 diabetes mellitus with diabetic chronic kidney disease: Secondary | ICD-10-CM | POA: Diagnosis present

## 2018-02-06 DIAGNOSIS — Z7984 Long term (current) use of oral hypoglycemic drugs: Secondary | ICD-10-CM | POA: Diagnosis not present

## 2018-02-06 DIAGNOSIS — I161 Hypertensive emergency: Secondary | ICD-10-CM | POA: Diagnosis present

## 2018-02-06 DIAGNOSIS — D649 Anemia, unspecified: Secondary | ICD-10-CM | POA: Diagnosis present

## 2018-02-06 DIAGNOSIS — Z7902 Long term (current) use of antithrombotics/antiplatelets: Secondary | ICD-10-CM | POA: Diagnosis not present

## 2018-02-06 DIAGNOSIS — Z955 Presence of coronary angioplasty implant and graft: Secondary | ICD-10-CM | POA: Diagnosis not present

## 2018-02-06 DIAGNOSIS — I160 Hypertensive urgency: Secondary | ICD-10-CM | POA: Diagnosis not present

## 2018-02-06 DIAGNOSIS — N183 Chronic kidney disease, stage 3 (moderate): Secondary | ICD-10-CM | POA: Diagnosis present

## 2018-02-06 DIAGNOSIS — Z841 Family history of disorders of kidney and ureter: Secondary | ICD-10-CM | POA: Diagnosis not present

## 2018-02-06 DIAGNOSIS — I214 Non-ST elevation (NSTEMI) myocardial infarction: Secondary | ICD-10-CM | POA: Diagnosis present

## 2018-02-06 DIAGNOSIS — I252 Old myocardial infarction: Secondary | ICD-10-CM | POA: Diagnosis not present

## 2018-02-06 DIAGNOSIS — I2510 Atherosclerotic heart disease of native coronary artery without angina pectoris: Secondary | ICD-10-CM | POA: Diagnosis present

## 2018-02-06 DIAGNOSIS — Z79899 Other long term (current) drug therapy: Secondary | ICD-10-CM | POA: Diagnosis not present

## 2018-02-06 LAB — ECHOCARDIOGRAM COMPLETE
Height: 73 in
Weight: 3571.2 oz

## 2018-02-06 LAB — TROPONIN I: Troponin I: 2 ng/mL (ref <0.03)

## 2018-02-06 LAB — CBC
HCT: 39.9 % (ref 39.0–52.0)
Hemoglobin: 13.3 g/dL (ref 13.0–17.0)
MCH: 29.4 pg (ref 26.0–34.0)
MCHC: 33.3 g/dL (ref 30.0–36.0)
MCV: 88.1 fL (ref 80.0–100.0)
Platelets: 191 10*3/uL (ref 150–400)
RBC: 4.53 MIL/uL (ref 4.22–5.81)
RDW: 14.6 % (ref 11.5–15.5)
WBC: 7.6 10*3/uL (ref 4.0–10.5)
nRBC: 0 % (ref 0.0–0.2)

## 2018-02-06 LAB — HEPARIN LEVEL (UNFRACTIONATED)
HEPARIN UNFRACTIONATED: 0.57 [IU]/mL (ref 0.30–0.70)
Heparin Unfractionated: 0.89 IU/mL — ABNORMAL HIGH (ref 0.30–0.70)

## 2018-02-06 LAB — BASIC METABOLIC PANEL
Anion gap: 9 (ref 5–15)
BUN: 26 mg/dL — ABNORMAL HIGH (ref 8–23)
CHLORIDE: 107 mmol/L (ref 98–111)
CO2: 21 mmol/L — ABNORMAL LOW (ref 22–32)
Calcium: 8.6 mg/dL — ABNORMAL LOW (ref 8.9–10.3)
Creatinine, Ser: 1.36 mg/dL — ABNORMAL HIGH (ref 0.61–1.24)
GFR calc Af Amer: >60 mL/min (ref >60)
GFR calc non Af Amer: 56 mL/min — ABNORMAL LOW (ref >60)
Glucose, Bld: 97 mg/dL (ref 70–99)
Potassium: 4.3 mmol/L (ref 3.5–5.1)
SODIUM: 137 mmol/L (ref 135–145)

## 2018-02-06 LAB — GLUCOSE, CAPILLARY: GLUCOSE-CAPILLARY: 95 mg/dL (ref 70–99)

## 2018-02-06 LAB — HIV ANTIBODY (ROUTINE TESTING W REFLEX): HIV Screen 4th Generation wRfx: NONREACTIVE

## 2018-02-06 MED ORDER — CLOPIDOGREL BISULFATE 75 MG PO TABS
75.0000 mg | ORAL_TABLET | Freq: Every day | ORAL | Status: DC
  Administered 2018-02-06 – 2018-02-08 (×3): 75 mg via ORAL
  Filled 2018-02-06 (×3): qty 1

## 2018-02-06 NOTE — Progress Notes (Addendum)
Progress Note  Patient Name: DAMEION YARTER Date of Encounter: 02/06/2018  Primary Cardiologist: [name], MD   Subjective   No CP or dyspnea  Inpatient Medications    Scheduled Meds: . amLODipine  10 mg Oral Daily  . aspirin EC  81 mg Oral Daily  . atorvastatin  80 mg Oral Daily  . carvedilol  25 mg Oral BID WC   Continuous Infusions: . heparin 1,400 Units/hr (02/06/18 0643)  . nitroGLYCERIN Stopped (02/05/18 2118)   PRN Meds: hydrALAZINE, nitroGLYCERIN   Vital Signs    Vitals:   02/06/18 0140 02/06/18 0240 02/06/18 0553 02/06/18 0640  BP: 124/79 128/80 (!) 153/89 (!) 157/83  Pulse:   75 81  Resp:   15 (!) 27  Temp:   (!) 97.5 F (36.4 C)   TempSrc:   Oral   SpO2:   98% 97%  Weight:      Height:        Intake/Output Summary (Last 24 hours) at 02/06/2018 0709 Last data filed at 02/06/2018 0600 Gross per 24 hour  Intake 990.79 ml  Output 650 ml  Net 340.79 ml   Last 3 Weights 02/05/2018 03/30/2017 11/09/2016  Weight (lbs) 234 lb 203 lb 204 lb  Weight (kg) 106.142 kg 92.08 kg 92.534 kg      Telemetry    Sinus with PVC - Personally Reviewed  Physical Exam   GEN: No acute distress.   Neck: No JVD Cardiac: RRR, no murmurs, rubs, or gallops.  Respiratory: Clear to auscultation bilaterally. GI: Soft, nontender, non-distended  MS: No edema Neuro:  Nonfocal  Psych: Normal affect   Labs    Chemistry Recent Labs  Lab 02/05/18 1528 02/06/18 0444  NA 137 137  K 4.7 4.3  CL 106 107  CO2 25 21*  GLUCOSE 139* 97  BUN 28* 26*  CREATININE 1.47* 1.36*  CALCIUM 8.9 8.6*  PROT 6.5  --   ALBUMIN 3.0*  --   AST 16  --   ALT 12  --   ALKPHOS 68  --   BILITOT 0.7  --   GFRNONAA 51* 56*  GFRAA 59* >60  ANIONGAP 6 9     Hematology Recent Labs  Lab 02/05/18 1528 02/06/18 0444  WBC 7.3 7.6  RBC 4.71 4.53  HGB 13.7 13.3  HCT 42.1 39.9  MCV 89.4 88.1  MCH 29.1 29.4  MCHC 32.5 33.3  RDW 14.1 14.6  PLT 180 191    Cardiac  Enzymes Recent Labs  Lab 02/05/18 1838 02/05/18 2250 02/06/18 0444  TROPONINI 0.91* 1.54* 2.00*    Recent Labs  Lab 02/05/18 1543  TROPIPOC 0.06     Radiology    Dg Chest 2 View  Result Date: 02/05/2018 CLINICAL DATA:  Central chest pain EXAM: CHEST - 2 VIEW COMPARISON:  03/30/2017 FINDINGS: The heart is mildly enlarged. Normal pulmonary vascularity. Clear lungs. No pneumothorax or pleural effusion. IMPRESSION: Cardiomegaly without decompensation. Electronically Signed   By: Jolaine Click M.D.   On: 02/05/2018 16:42    Patient Profile     63 year old male with past medical history of coronary artery disease status post coronary artery bypass and graft, hypertension, hyperlipidemia, diabetes mellitus, chronic stage III kidney disease, long history of noncompliance admitted with chest pain and hypertensive urgency.  Patient has not been taking his medications in the past 1 week.    Patient has ruled in for non-ST elevation myocardial infarction.  Assessment & Plan    1  NSTEMI-patient has ruled in for a non-ST elevation myocardial infarction.  He denies recurrent chest pain.  Long discussion this morning concerning options.  I recommended cardiac catheterization.  The risks and benefits including myocardial infarction, CVA, death and renal insufficiency discussed.  I also discussed the risks of not performing catheterization including undiagnosed coronary disease with the possibility of recurrent myocardial infarction and death.  He is clear that he does not want to proceed with cardiac catheterization.  He understands the risk.  Therefore we will plan medical therapy with aspirin, statin and beta-blocker. Add plavix 75 mg daily. Continue IV nitroglycerin for now.  Continue heparin for 48 hours and then discontinue.  Watch for recurrent chest pain.  Check echocardiogram for LV function.  2 hypertensive urgency-blood pressure has improved.  Continue present medications.  If additional  medications needed would add [name].  3 coronary artery disease-continue aspirin and statin.  Plan as outlined above.  4 noncompliance-patient previously educated on importance of compliance with medical therapy.  5 chronic stage III kidney disease-follow renal function while in-house.  For questions or updates, please contact [facility] Please consult [email or web address] for contact info under        Signed, Olga MillersBrian Crenshaw, MD  02/06/2018, 7:09 AM

## 2018-02-06 NOTE — Progress Notes (Signed)
ANTICOAGULATION CONSULT NOTE - Follow Up Consult  Pharmacy Consult for heparin Indication: chest pain/ACS  Labs: Recent Labs    02/05/18 1528 02/05/18 1838 02/05/18 2250 02/06/18 0444  HGB 13.7  --   --  13.3  HCT 42.1  --   --  39.9  PLT 180  --   --  191  HEPARINUNFRC  --   --   --  0.89*  CREATININE 1.47*  --   --   --   TROPONINI  --  0.91* 1.54*  --     Assessment: 62yo male supratherapeutic on heparin with initial dosing for CP; no gtt issues or signs of bleeding per RN.  Goal of Therapy:  Heparin level 0.3-0.7 units/ml   Plan:  Will decrease heparin gtt by 2 units/kg/hr to 1400 units/hr and check level in 6 hours.    Colleen Can 02/06/2018,6:08 AM

## 2018-02-06 NOTE — Progress Notes (Signed)
ANTICOAGULATION CONSULT NOTE  Pharmacy Consult for heparin Indication: chest pain/ACS  No Known Allergies  Patient Measurements: Height: 6\' 1"  (185.4 cm) Weight: 223 lb 3.2 oz (101.2 kg) IBW/kg (Calculated) : 79.9 Heparin Dosing Weight: 101kg  Vital Signs: Temp: 98 F (36.7 C) (01/10 0640) Temp Source: Oral (01/10 0640) BP: 155/90 (01/10 0841) Pulse Rate: 69 (01/10 0841)  Labs: Recent Labs    02/05/18 1528 02/05/18 1838 02/05/18 2250 02/06/18 0444 02/06/18 1250  HGB 13.7  --   --  13.3  --   HCT 42.1  --   --  39.9  --   PLT 180  --   --  191  --   HEPARINUNFRC  --   --   --  0.89* 0.57  CREATININE 1.47*  --   --  1.36*  --   TROPONINI  --  0.91* 1.54* 2.00*  --     Estimated Creatinine Clearance: 71.3 mL/min (A) (by C-G formula based on SCr of 1.36 mg/dL (H)).   Medical History: Past Medical History:  Diagnosis Date  . Claudication ([facility])   . Coronary artery disease    a. 08/2007 s/p DES to RCA/RI;  b. 07/2006 s/p DES to p/d LCX;  c. 11/2007 CABGx5 (LIMA->LAD, VG->D1, LRA->OM1, VG->PDA->LPL);  d. 09/2012 STEMI/Cath: 3VD, LIMA->LAD nl (LAD 95d), RA->OM nl, VG->PDA->LPL 100, unable to locate VG->Diag, EF 60-65%->Med Rx. e. 03/2015: Synergy DES to mid-Cx in the setting of NSTEMI; f. 11/2015 NSTEMI->Med Rx.  . Diabetes mellitus without complication ([facility])    a. A1c 7.0 09/2012.  . Diastolic dysfunction    a. 02/2016 Echo: EF 50-55%, gr1DD, Ao sclerosis, dil Ao root (27mm), sev dil LA, triv TR, PASP .  Marland Kitchen Hyperlipidemia   . Hypertension   . Noncompliance    Assessment: 63 year old male being admitted for chest pain and hypertensive urgency d/t medication noncompliance.   CE's now elevated, orders to start IV heparin. CBC within normal limits. Heparin level at goal after rate decrease. No bleed issues documented.  Goal of Therapy:  Heparin level 0.3-0.7 units/ml Monitor platelets by anticoagulation protocol: Yes   Plan:  Continue heparin at 1400  units/hr Monitor daily heparin level and CBC, s/sx bleeding Patient refuses cath per notes - planning heparin x 48hrs (started 1/9 at 2145)  Babs Bertin, PharmD, BCPS Please check AMION for all [facility] Pharm[facility] contact numbers Clinical Pharmacist 02/06/2018 1:26 PM

## 2018-02-06 NOTE — Progress Notes (Signed)
  Echocardiogram 2D Echocardiogram has been performed.  Janalyn Harder 02/06/2018, 1:52 PM

## 2018-02-06 NOTE — Progress Notes (Signed)
Cardiology On-call Notified of positive troponin. Orders received for Heparin gtt and Nitro gtt for BP control. Patient with no complaints of pain at this time.

## 2018-02-07 DIAGNOSIS — I160 Hypertensive urgency: Secondary | ICD-10-CM

## 2018-02-07 DIAGNOSIS — I214 Non-ST elevation (NSTEMI) myocardial infarction: Principal | ICD-10-CM

## 2018-02-07 LAB — BASIC METABOLIC PANEL
Anion gap: 7 (ref 5–15)
BUN: 28 mg/dL — AB (ref 8–23)
CO2: 21 mmol/L — ABNORMAL LOW (ref 22–32)
Calcium: 8.3 mg/dL — ABNORMAL LOW (ref 8.9–10.3)
Chloride: 108 mmol/L (ref 98–111)
Creatinine, Ser: 1.28 mg/dL — ABNORMAL HIGH (ref 0.61–1.24)
GFR calc Af Amer: >60 mL/min (ref >60)
GFR calc non Af Amer: >60 mL/min (ref >60)
Glucose, Bld: 93 mg/dL (ref 70–99)
Potassium: 4.1 mmol/L (ref 3.5–5.1)
Sodium: 136 mmol/L (ref 135–145)

## 2018-02-07 LAB — CBC
HCT: 39.1 % (ref 39.0–52.0)
Hemoglobin: 12.7 g/dL — ABNORMAL LOW (ref 13.0–17.0)
MCH: 28.8 pg (ref 26.0–34.0)
MCHC: 32.5 g/dL (ref 30.0–36.0)
MCV: 88.7 fL (ref 80.0–100.0)
Platelets: 160 10*3/uL (ref 150–400)
RBC: 4.41 MIL/uL (ref 4.22–5.81)
RDW: 14.5 % (ref 11.5–15.5)
WBC: 5.8 10*3/uL (ref 4.0–10.5)
nRBC: 0 % (ref 0.0–0.2)

## 2018-02-07 LAB — GLUCOSE, CAPILLARY
GLUCOSE-CAPILLARY: 148 mg/dL — AB (ref 70–99)
Glucose-Capillary: 90 mg/dL (ref 70–99)
Glucose-Capillary: 107 mg/dL — ABNORMAL HIGH (ref 70–99)
Glucose-Capillary: 88 mg/dL (ref 70–99)

## 2018-02-07 LAB — HEPARIN LEVEL (UNFRACTIONATED): Heparin Unfractionated: 0.33 [IU]/mL (ref 0.30–0.70)

## 2018-02-07 MED ORDER — LOSARTAN POTASSIUM 50 MG PO TABS
50.0000 mg | ORAL_TABLET | Freq: Every day | ORAL | Status: DC
  Administered 2018-02-07 – 2018-02-08 (×2): 50 mg via ORAL
  Filled 2018-02-07 (×2): qty 1

## 2018-02-07 MED ORDER — ISOSORBIDE MONONITRATE ER 60 MG PO TB24
120.0000 mg | ORAL_TABLET | Freq: Every day | ORAL | Status: DC
  Administered 2018-02-07 – 2018-02-08 (×2): 120 mg via ORAL
  Filled 2018-02-07 (×2): qty 2

## 2018-02-07 NOTE — Progress Notes (Signed)
ANTICOAGULATION CONSULT NOTE  Pharmacy Consult for heparin Indication: chest pain/ACS  No Known Allergies  Patient Measurements: Height: 6\' 1"  (185.4 cm) Weight: 223 lb 11.2 oz (101.5 kg) IBW/kg (Calculated) : 79.9 Heparin Dosing Weight: 101kg  Vital Signs: Temp: 98.9 F (37.2 C) (01/11 0503) Temp Source: Oral (01/11 0503) BP: 190/110 (01/11 0817) Pulse Rate: 77 (01/11 0817)  Labs: Recent Labs    02/05/18 1528 02/05/18 1838 02/05/18 2250 02/06/18 0444 02/06/18 1250 02/07/18 0337  HGB 13.7  --   --  13.3  --  12.7*  HCT 42.1  --   --  39.9  --  39.1  PLT 180  --   --  191  --  160  HEPARINUNFRC  --   --   --  0.89* 0.57 0.33  CREATININE 1.47*  --   --  1.36*  --  1.28*  TROPONINI  --  0.91* 1.54* 2.00*  --   --     Estimated Creatinine Clearance: 75.9 mL/min (A) (by C-G formula based on SCr of 1.28 mg/dL (H)).   Medical History: Past Medical History:  Diagnosis Date  . Claudication (HCC)   . Coronary artery disease    a. 08/2007 s/p DES to RCA/RI;  b. 07/2006 s/p DES to p/d LCX;  c. 11/2007 CABGx5 (LIMA->LAD, VG->D1, LRA->OM1, VG->PDA->LPL);  d. 09/2012 STEMI/Cath: 3VD, LIMA->LAD nl (LAD 95d), RA->OM nl, VG->PDA->LPL 100, unable to locate VG->Diag, EF 60-65%->Med Rx. e. 03/2015: Synergy DES to mid-Cx in the setting of NSTEMI; f. 11/2015 NSTEMI->Med Rx.  . Diabetes mellitus without complication (HCC)    a. A1c 7.0 09/2012.  . Diastolic dysfunction    a. 02/2016 Echo: EF 50-55%, gr1DD, Ao sclerosis, dil Ao root (42mm), sev dil LA, triv TR, PASP 22mmHg.  Marland Kitchen. Hyperlipidemia   . Hypertension   . Noncompliance    Assessment: 63 year old male being admitted for chest pain and hypertensive urgency d/t medication noncompliance.   CE's now elevated, orders to start IV heparin. CBC within normal limits. Heparin level at goal after rate decrease. No bleed issues documented.  Goal of Therapy:  Heparin level 0.3-0.7 units/ml Monitor platelets by anticoagulation protocol: Yes    Plan:  Continue heparin at 1400 units/hr Monitor daily heparin level and CBC, s/sx bleeding Planning heparin x 48hrs - stop time in for tonight  Babs BertinHaley Jacobie Stamey, PharmD, BCPS Please check AMION for all [facility] contact numbers Clinical Pharmacist 02/07/2018 10:32 AM

## 2018-02-07 NOTE — Progress Notes (Signed)
Progress Note  Patient Name: Darrell [name] Date of Encounter: 02/07/2018  Primary Cardiologist:   [name], MD   Subjective   No chest pain. No SOB.   Inpatient Medications    Scheduled Meds: . amLODipine  10 mg Oral Daily  . aspirin EC  81 mg Oral Daily  . atorvastatin  80 mg Oral Daily  . carvedilol  25 mg Oral BID WC  . clopidogrel  75 mg Oral Daily   Continuous Infusions: . heparin 1,400 Units/hr (02/07/18 0922)  . nitroGLYCERIN 5 mcg/min (02/06/18 2153)   PRN Meds: hydrALAZINE, nitroGLYCERIN   Vital Signs    Vitals:   02/06/18 1448 02/06/18 2149 02/07/18 0503 02/07/18 0817  BP: (!) 142/83 (!) 171/106 (!) 182/101 (!) 190/110  Pulse: 79 76 75 77  Resp: 20 13 19  (!) 26  Temp: 98.4 F (36.9 C) 99.3 F (37.4 C) 98.9 F (37.2 C)   TempSrc: Oral Oral Oral   SpO2: 99% 98% 97% 98%  Weight:   101.5 kg   Height:        Intake/Output Summary (Last 24 hours) at 02/07/2018 1219 Last data filed at 02/07/2018 1000 Gross per 24 hour  Intake 400 ml  Output 1175 ml  Net -775 ml   Filed Weights   02/05/18 1509 02/06/18 0640 02/07/18 0503  Weight: 106.1 kg 101.2 kg 101.5 kg    Telemetry    NSR - Personally Reviewed  ECG    NA - Personally Reviewed  Physical Exam   GEN: No acute distress.   Neck: No  JVD Cardiac: RRR, NO murmurs, rubs, or gallops.  Respiratory: Clear  to auscultation bilaterally. GI: Soft, nontender, non-distended  MS: No  edema; No deformity. Neuro:  Nonfocal  Psych: Normal affect   Labs    Chemistry Recent Labs  Lab 02/05/18 1528 02/06/18 0444 02/07/18 0337  NA 137 137 136  K 4.7 4.3 4.1  CL 106 107 108  CO2 25 21* 21*  GLUCOSE 139* 97 93  BUN 28* 26* 28*  CREATININE 1.47* 1.36* 1.28*  CALCIUM 8.9 8.6* 8.3*  PROT 6.5  --   --   ALBUMIN 3.0*  --   --   AST 16  --   --   ALT 12  --   --   ALKPHOS 68  --   --   BILITOT 0.7  --   --   GFRNONAA 51* 56* >60  GFRAA 59* >60 >60  ANIONGAP 6 9 7       Hematology Recent Labs  Lab 02/05/18 1528 02/06/18 0444 02/07/18 0337  WBC 7.3 7.6 5.8  RBC 4.71 4.53 4.41  HGB 13.7 13.3 12.7*  HCT 42.1 39.9 39.1  MCV 89.4 88.1 88.7  MCH 29.1 29.4 28.8  MCHC 32.5 33.3 32.5  RDW 14.1 14.6 14.5  PLT 180 191 160    Cardiac Enzymes Recent Labs  Lab 02/05/18 1838 02/05/18 2250 02/06/18 0444  TROPONINI 0.91* 1.54* 2.00*    Recent Labs  Lab 02/05/18 1543  TROPIPOC 0.06     BNPNo results for input(s): BNP, PROBNP in the last 168 hours.   DDimer No results for input(s): DDIMER in the last 168 hours.   Radiology    Dg Chest 2 View  Result Date: 02/05/2018 CLINICAL DATA:  Central chest pain EXAM: CHEST - 2 VIEW COMPARISON:  03/30/2017 FINDINGS: The heart is mildly enlarged. Normal pulmonary vascularity. Clear lungs. No pneumothorax or pleural effusion. IMPRESSION: Cardiomegaly without decompensation.  Electronically Signed   By: Jolaine Click M.D.   On: 02/05/2018 16:42    Cardiac Studies   Echo    - Procedure narrative: Transthoracic echocardiography. Image   quality was poor. The study was technically difficult, as a   result of poor sound wave transmission. - Left ventricle: The cavity size was normal. There was moderate   concentric hypertrophy. Systolic function was normal. The   estimated ejection fraction was in the range of 55% to 60%.   Hypokinesis of the entireinferior myocardium; consistent with   ischemia or infarction in the distribution of the right coronary   artery. Features are consistent with a pseudonormal left   ventricular filling pattern, with concomitant abnormal relaxation   and increased filling pressure (grade 2 diastolic dysfunction). - Left atrium: The atrium was moderately dilated. - Right atrium: The atrium was mildly dilated.   Patient Profile     63 y.o. male with past medical history of coronary artery disease status post coronary artery bypass and graft, hypertension, hyperlipidemia, diabetes  mellitus, chronic stage III kidney disease, long history of noncompliance admitted with chest pain and hypertensive urgency. Patient has not been taking his medications in the past 1 week.   Patient has ruled in for non-ST elevation myocardial infarction.  Assessment & Plan    NSTEMI:   He did not want a cardiac cath.  Medical management only.  Plavix was added yesterday.  Stop heparin and IV NTG.  Ambulate today.  No further pain.  Probably home in AM.  EF OK as above  HTN:  Significant HTN.    CKD III:    Was elevated on admission but better today.  I think that he will tolerate [name] and will start this.  Can use PRN hydralazine as we discontinue the NTG IV.     For questions or updates, please contact [facility] Please consult [email or web address] for contact info under Cardiology/STEMI.   Signed, [name], MD  02/07/2018, 12:19 PM

## 2018-02-08 ENCOUNTER — Encounter (HOSPITAL_COMMUNITY): Payer: Self-pay | Admitting: Physician Assistant

## 2018-02-08 ENCOUNTER — Other Ambulatory Visit: Payer: Self-pay | Admitting: Physician Assistant

## 2018-02-08 DIAGNOSIS — I10 Essential (primary) hypertension: Secondary | ICD-10-CM | POA: Diagnosis present

## 2018-02-08 DIAGNOSIS — N179 Acute kidney failure, unspecified: Secondary | ICD-10-CM | POA: Diagnosis present

## 2018-02-08 DIAGNOSIS — N183 Chronic kidney disease, stage 3 unspecified: Secondary | ICD-10-CM

## 2018-02-08 DIAGNOSIS — D649 Anemia, unspecified: Secondary | ICD-10-CM

## 2018-02-08 DIAGNOSIS — R0989 Other specified symptoms and signs involving the circulatory and respiratory systems: Secondary | ICD-10-CM | POA: Diagnosis present

## 2018-02-08 DIAGNOSIS — N189 Chronic kidney disease, unspecified: Secondary | ICD-10-CM | POA: Diagnosis present

## 2018-02-08 DIAGNOSIS — E785 Hyperlipidemia, unspecified: Secondary | ICD-10-CM | POA: Diagnosis present

## 2018-02-08 DIAGNOSIS — E119 Type 2 diabetes mellitus without complications: Secondary | ICD-10-CM

## 2018-02-08 LAB — GLUCOSE, CAPILLARY: Glucose-Capillary: 98 mg/dL (ref 70–99)

## 2018-02-08 LAB — CBC
HCT: 35.5 % — ABNORMAL LOW (ref 39.0–52.0)
Hemoglobin: 11.6 g/dL — ABNORMAL LOW (ref 13.0–17.0)
MCH: 29.1 pg (ref 26.0–34.0)
MCHC: 32.7 g/dL (ref 30.0–36.0)
MCV: 89 fL (ref 80.0–100.0)
Platelets: 178 10*3/uL (ref 150–400)
RBC: 3.99 MIL/uL — ABNORMAL LOW (ref 4.22–5.81)
RDW: 14.5 % (ref 11.5–15.5)
WBC: 6.9 10*3/uL (ref 4.0–10.5)
nRBC: 0 % (ref 0.0–0.2)

## 2018-02-08 MED ORDER — NITROGLYCERIN 0.4 MG SL SUBL: 0.4000 mg | SUBLINGUAL_TABLET | SUBLINGUAL | 2 refills | Status: DC | PRN

## 2018-02-08 MED ORDER — ASPIRIN 81 MG PO TBEC: 81.0000 mg | DELAYED_RELEASE_TABLET | Freq: Every day | ORAL | 3 refills | Status: DC

## 2018-02-08 MED ORDER — LOSARTAN POTASSIUM 100 MG PO TABS: 100.0000 mg | ORAL_TABLET | Freq: Every day | ORAL | 6 refills | Status: DC

## 2018-02-08 MED ORDER — LOSARTAN POTASSIUM 50 MG PO TABS: 50.0000 mg | ORAL_TABLET | Freq: Once | ORAL | Status: DC

## 2018-02-08 MED ORDER — LOSARTAN POTASSIUM 50 MG PO TABS: 100.0000 mg | ORAL_TABLET | Freq: Every day | ORAL | Status: DC

## 2018-02-08 MED ORDER — ISOSORBIDE MONONITRATE ER 120 MG PO TB24: 120.0000 mg | ORAL_TABLET | Freq: Every day | ORAL | 6 refills | Status: DC

## 2018-02-08 MED ORDER — CLOPIDOGREL BISULFATE 75 MG PO TABS: 75.0000 mg | ORAL_TABLET | Freq: Every day | ORAL | 6 refills | Status: DC

## 2018-02-08 MED ORDER — CARVEDILOL 25 MG PO TABS: 25.0000 mg | ORAL_TABLET | Freq: Two times a day (BID) | ORAL | 6 refills | Status: DC

## 2018-02-08 MED ORDER — ATORVASTATIN CALCIUM 80 MG PO TABS: 80.0000 mg | ORAL_TABLET | Freq: Every evening | ORAL | 6 refills | Status: DC

## 2018-02-08 MED ORDER — AMLODIPINE BESYLATE 10 MG PO TABS: 10.0000 mg | ORAL_TABLET | Freq: Every day | ORAL | 6 refills | Status: DC

## 2018-02-08 NOTE — Discharge Summary (Addendum)
Discharge Summary    Patient ID: [name],  MRN: [id number], DOB/AGE: 08-05-1955 63 y.o.  Admit date: 02/05/2018 Discharge date: 02/08/2018  Primary Care Provider: [name] Primary Cardiologist: Minus Breeding, MD Primary Electrophysiologist:  None  Discharge Diagnoses    Principal Problem:   NSTEMI (non-ST elevated myocardial infarction) [facility]) Active Problems:   Coronary atherosclerosis, s/p CABG in 2009    Hypertensive urgency   History of noncompliance with medical treatment, presenting hazards to health   CKD (chronic kidney disease), stage III ([place])   Diabetes mellitus type 2 in nonobese [facility])   Hyperlipidemia   Hypertension   Right carotid bruit   Mild anemia    Diagnostic Studies/Procedures    2D echo 02/06/2018 Study Conclusions  - Procedure narrative: Transthoracic echocardiography. Image   quality was poor. The study was technically difficult, as a   result of poor sound wave transmission. - Left ventricle: The cavity size was normal. There was moderate   concentric hypertrophy. Systolic function was normal. The   estimated ejection fraction was in the range of 55% to 60%.   Hypokinesis of the entireinferior myocardium; consistent with   ischemia or infarction in the distribution of the right coronary   artery. Features are consistent with a pseudonormal left   ventricular filling pattern, with concomitant abnormal relaxation   and increased filling pressure (grade 2 diastolic dysfunction). - Left atrium: The atrium was moderately dilated. - Right atrium: The atrium was mildly dilated.    _____________     History of Present Illness     Darrell [name] is a 63 y.o. male with history of CAD s/p multiple PCIs and CABG, HTN, HL, CKD III, DM, and long h/o noncompliance who was admitted to [facility] with chest pain. He ruled in for NSTEMI.  The patient has history of multiple prior PCIs - stents to prox-mid RCA and RI 08/2001, DES to  distal Cx & DES to prox Cx 07/2006, CABG 11/2007, and NSTEMI 03/2015 s/p PTCA/DES to native Cx for ISR. Last cardiac catheterization was in 03/2015. At that time, he had a patent radial graft to an obtuse marginal branch and a patent LIMA to the mid LAD. The distal LAD was diffusely disease. There were left-to-right collaterals. The native right coronary artery was also occluded as well as the graft to the PDA and PLA. He was noted to have high-grade in-stent restenosis in the proximal codominant circumflex, which was felt to be the culprit lesion at that time for his non-STEMI. The proximal circumflex was stented however the distal circumflex was treated medically. We have seen this patient multiple times in the hospital for chest pain, NSTEMIs and hypertensive urgency secondary to noncompliance. He has never followed up in clinic.  He reports that he is mainly noncompliant with his medications due to inability to afford them. He has not been seen since 2018.  He returned to [facility] with chest pain. While watching TV on the afternoon of admission he developed sudden onset substernal chest discomfort, which felt like indigestion. The pain did not radiate. It was intense.  No associated dyspnea, nausea, vomiting or diaphoresis. When EMS arrived, they checked his blood pressure and it was noted to be severely elevated in the 283T systolic. He was given sublingual nitroglycerin with complete resolution of his chest pain  He was brought into the ED for further evaluation. EKG showed sinus rhythm with left atrial enlargement and old anterior infarct with borderline inferior T  wave abnormalities. Labs were notable for normal CBC, Cr 1.47 (previous rate 1.1-1.4), and negative POC troponin. While in the ER he felt fine and requested to go home but required admission for concern for angina.  Hospital Course    1. CAD with NSTEMI - troponin ruled in with trend 0.91->1.54->2.00. He was placed on IV heparin and  antihypertensive regimen was resumed/titrated. Cardiac cath was offered to the patient but he declined. Plavix was started for medical therapy. Cardiac rehab team is not available on Sunday for phase I cardiac rehab but can be revisited as OP - may not be able to afford this. 2D echo 02/06/18 showed EF 55-60%, hypokinesis of the entireinferior myocardium; consistent with ischemia or infarction in the distribution of the right coronary artery, grade 2 DD, moderate LAE, mildly dilated RA - study was technically difficult.  2. CKD stage III - creatinine trend 1.47->1.36->1.28. [name] was resumed. He will be at risk for worsening nephropathy long term if he remains noncompliant with hypertension therapy. Have advised he come to our office in 1 week for BMET given [name] re-initiation/titration.  3. Hypertensive urgency - BP improved to 155/85. We will titrate losartan to 127m today. Close f/u encouraged.  4. Hyperlipidemia - atorvastatin resumed. If the patient is tolerating statin at time of follow-up appointment, would consider rechecking liver function/lipid panel in 6-8 weeks.  5. Noncompliance - asked care manager to see prior to dc. Importance of compliance reinforced.  6. DM - A1c 5.2.  7. Right carotid bruit - can arrange follow-up duplex as outpatient when patient comes for OV.  8. Mild anemia - Hgb trend noted 13.3->12.7->11.6. No bleeding reported. Will check CBC when he returns for BMET in 1 week. If he does not follow up this will be something that needs attention by primary care.  The patient ambulated today without angina and feels well. Dr. HPercival Spanishhas seen and examined the patient today and feels he is stable for discharge. I have sent a message to our office's scheduling team requesting a 1 week BMET and a follow-up appointment, and our office will call the patient with this information. The patient has indicated he cannot have a follow-up appointment until 03/14/18 due to financial issues  but I did request office give him TOC call. _____________  Vital Signs. BP (!) 155/85   Pulse 76   Temp 98.4 F (36.9 C) (Oral)   Resp 20   Ht 6' 1" (1.854 m)   Wt 100.3 kg   SpO2 96%   BMI 29.17 kg/m  General: Well developed, well nourished WM, in no acute distress. Head: Normocephalic, atraumatic, sclera non-icteric, no xanthomas, nares are without discharge. Neck: + Right carotid bruit. JVP not elevated. Lungs: Clear bilaterally to auscultation without wheezes, rales, or rhonchi. Breathing is unlabored. Heart: RRR S1 S2 without murmurs, rubs, or gallops.  Abdomen: Soft, non-tender, non-distended with normoactive bowel sounds. No rebound/guarding. Extremities: No clubbing or cyanosis. No edema. Distal pedal pulses are 2+ and equal bilaterally. Neuro: Alert and oriented X 3. Moves all extremities spontaneously. Psych:  Responds to questions appropriately with a flat affect. No SI/HI.   Labs & Radiologic Studies    CBC Recent Labs    02/05/18 1528  02/07/18 0337 02/08/18 0357  WBC 7.3   < > 5.8 6.9  NEUTROABS 6.0  --   --   --   HGB 13.7   < > 12.7* 11.6*  HCT 42.1   < > 39.1 35.5*  MCV  89.4   < > 88.7 89.0  PLT 180   < > 160 178   < > = values in this interval not displayed.   Basic Metabolic Panel Recent Labs    02/06/18 0444 02/07/18 0337  NA 137 136  K 4.3 4.1  CL 107 108  CO2 21* 21*  GLUCOSE 97 93  BUN 26* 28*  CREATININE 1.36* 1.28*  CALCIUM 8.6* 8.3*   Liver Function Tests Recent Labs    02/05/18 1528  AST 16  ALT 12  ALKPHOS 68  BILITOT 0.7  PROT 6.5  ALBUMIN 3.0*   No results for input(s): LIPASE, AMYLASE in the last 72 hours. Cardiac Enzymes Recent Labs    02/05/18 1838 02/05/18 2250 02/06/18 0444  TROPONINI 0.91* 1.54* 2.00*   BNP Invalid input(s): POCBNP D-Dimer No results for input(s): DDIMER in the last 72 hours. Hemoglobin A1C Recent Labs    02/05/18 1843  HGBA1C 5.2  ____________  Dg Chest 2 View  Result Date:  02/05/2018 CLINICAL DATA:  Central chest pain EXAM: CHEST - 2 VIEW COMPARISON:  03/30/2017 FINDINGS: The heart is mildly enlarged. Normal pulmonary vascularity. Clear lungs. No pneumothorax or pleural effusion. IMPRESSION: Cardiomegaly without decompensation. Electronically Signed   By: Marybelle Killings M.D.   On: 02/05/2018 16:42   Disposition   Pt is being discharged home today in good condition.  Follow-up Plans & Appointments    Follow-up Information    [facility] Follow up.   Specialty:  Cardiology Why:  Please come to the office on 02/16/2018 to have your labs rechecked during business hours (BMET and CBC). You can show them this paper. The office will also call you to coordinate a follow-up appointment. Contact information: [address] [place] [phone]       [name], MD. Call.   Specialty:  Family Medicine Contact information: [place] [address] [phone]          Discharge Instructions    Diet - low sodium heart healthy   Complete by:  As directed    Discharge instructions   Complete by:  As directed    No driving for 1 week. No lifting over 10 lbs for 2 weeks. No sexual activity for 2 weeks. Keep procedure site clean & dry. If you notice increased pain, swelling, bleeding or pus, call/return!  You may shower, but no soaking baths/hot tubs/pools for 1 week.  Please come to the office on Monday 02/16/2018 to have labs drawn. Your abnormal kidney function needs close follow-up. Your blood count also showed mild anemia. You do not need an appointment time for this, just come in to get blood drawn during business hours. This is not for your follow-up visit - the office will call you to schedule that.  If you notice any bleeding such as blood in stool, black tarry stools, blood in urine, nosebleeds or any other unusual bleeding, call your doctor immediately. It is not normal to have this  kind of bleeding while on a blood thinner and usually indicates there is an underlying problem with one of your body systems that needs to be checked out.   Increase activity slowly   Complete by:  As directed       Discharge Medications   Allergies as of 02/08/2018   No Known Allergies     Medication List    TAKE these medications   accu-chek softclix lancets Use as instructed  daily.   amLODipine 10 MG tablet Commonly known as:  NORVASC Take 1 tablet (10 mg total) by mouth daily.   aspirin 81 MG EC tablet Take 1 tablet (81 mg total) by mouth daily.   atorvastatin 80 MG tablet Commonly known as:  LIPITOR Take 1 tablet (80 mg total) by mouth every evening. What changed:  when to take this   carvedilol 25 MG tablet Commonly known as:  COREG Take 1 tablet (25 mg total) by mouth 2 (two) times daily with a meal.   clopidogrel 75 MG tablet Commonly known as:  PLAVIX Take 1 tablet (75 mg total) by mouth daily.   glucose blood test strip Commonly known as:  TRUE METRIX BLOOD GLUCOSE TEST Use as instructed   isosorbide mononitrate 120 MG 24 hr tablet Commonly known as:  IMDUR Take 1 tablet (120 mg total) by mouth daily. What changed:    medication strength  how much to take   losartan 100 MG tablet Commonly known as:  COZAAR Take 1 tablet (100 mg total) by mouth daily. What changed:    medication strength  how much to take   metFORMIN 500 MG tablet Commonly known as:  GLUCOPHAGE Take 1 tablet (500 mg total) by mouth 2 (two) times daily with a meal.   nitroGLYCERIN 0.4 MG SL tablet Commonly known as:  NITROSTAT Place 1 tablet (0.4 mg total) under the tongue every 5 (five) minutes as needed for chest pain (up to 3 doses. If taking 3rd dose call 911). What changed:    medication strength  See the new instructions.   TRUE METRIX METER w/Device Kit USE AS INSTRUCTED   TRUEPLUS LANCETS 28G Misc USE AS INSTRUCTED        Allergies:  No Known  Allergies  Aspirin prescribed at discharge?  Yes High Intensity Statin Prescribed? (Lipitor 40-42m or Crestor 20-462m: Yes Beta Blocker Prescribed? Yes For EF <40%, was ACEI/[name] Prescribed? No: N/a ADP Receptor Inhibitor Prescribed? (i.e. Plavix etc.-Includes Medically Managed Patients): Yes For EF <40%, Aldosterone Inhibitor Prescribed? No: N/a Was EF assessed during THIS hospitalization? Yes Was Cardiac Rehab II ordered? (Included Medically managed Patients): No: see above, not available today -will be revisited as outpatient   Outstanding Labs/Studies   BMET/CBC  Duration of Discharge Encounter   Greater than 30 minutes including physician time.  Signed, DaNedra Haiunn PA-C 02/08/2018, 10:27 AM  History and all data above reviewed.  Patient examined.  I agree with the findings as above.  No pain.  Breathing at baseline.  Wants to go home.The patient exam reveals COR:RRR  ,  Lungs: clear  ,  Abd: Positive bowel sounds, no rebound no guarding, Ext No edema  .  All available labs, radiology testing, previous records reviewed. Agree with documented assessment and plan. OK to discharge.  Meds as above.  He says he can come back to see us[place] follow up after mid Feb as finances won't allow before that.  We are having the case manager see him.    JaJeneen RinksoRegional Mental Health Center10:36 AM  02/08/2018

## 2018-02-08 NOTE — Progress Notes (Signed)
Reviewed all d/c instructions with patient and Rx given. Patient verbalized understanding.  IV d/ced without problems.  Waiting for delivery of WC and Son for d/c.

## 2018-02-08 NOTE — Care Management Note (Signed)
Case Management Note  Patient Details  Name: Darrell [name] MRN: [id number] Date of Birth: [birth year]  Subjective/Objective:     Pt to d/c home with wife.  Pt reports he lives with wife in a hotel and his son helps him pay for prescriptions and provides some transportation.  Pt states he is not able to walk long enough distances to complete tasks such as going to the pharmacy, appointments, etc.  Pt uses [employer] transportation if his son is unavailable.  Pt uses pharmacy and services of [facility]. Pt states he routinely takes medication if he has it.    Pt states he was denied Medicaid because he collects disability at $700/month.  Pt would like to reapply. Pt's son lives with his girlfriend and is not able to house patient.               Action/Plan: [facility] referral made to help patient with medication, insurance and housing needs.  Wheelchair order placed by PA and will be delivered to room prior to d/c.    Expected Discharge Date:  02/08/18               Expected Discharge Plan:  Home w Home Health Services  In-House Referral:  [facility]  Discharge planning Services  CM Consult, Medication Assistance  Post Acute Care Choice:  Durable Medical Equipment Choice offered to:  Patient  DME Arranged:  Wheelchair manual DME Agency:  [facility]  HH Arranged:  NA HH Agency:  NA  Status of Service:  Completed, signed off  If discussed at Long Length of Stay Meetings, dates discussed:    Additional Comments:  Deveron Furlong, RN 02/08/2018, 10:55 AM

## 2018-02-08 NOTE — Progress Notes (Addendum)
Patient requested wheelchair from CM. We have several patients that are in need to wheelchair for travelling longer distances particularly with attending appointments.  Per order "Patient suffers from chronic angina which impairs their ability to perform daily activities such as walking longer distances as needed for appointment attendance.  A cane will not resolve  issue with performing activities of daily living. A wheelchair will allow patient to safely perform daily activities. Patient can safely propel the wheelchair in the home or has a caregiver who can provide assistance.  Accessories: elevating leg rests (ELRs), wheel locks, extensions and anti-tippers."  I did speak with patient and we discussed that long term goal would be for him to rehabilitate back to normal endurance level and rely on WC as little as possible. He affirms that is his goal too. I told him if he gets home and develops recurrent CP with exertion we need to know about this as well. He assured me he will keep [place] informed, but would like wheelchair as a backup plan for safety in case longer distances are needed.  Dayna Dunn PA-C

## 2018-02-09 ENCOUNTER — Encounter: Payer: Self-pay | Admitting: *Deleted

## 2018-02-09 ENCOUNTER — Telehealth: Payer: Self-pay | Admitting: Cardiology

## 2018-02-09 ENCOUNTER — Other Ambulatory Visit: Payer: Self-pay | Admitting: *Deleted

## 2018-02-09 NOTE — Telephone Encounter (Signed)
Tried to call patient at [phone] that's an extended stay number and it ask for a room number which I don't have, and the cell number [phone] I called that and was told I had the wrong number. Unable to reach patient.

## 2018-02-09 NOTE — Telephone Encounter (Signed)
-----   Message from Laurann [place], [place] sent at 02/08/2018  9:54 AM EST ----- Regarding: Needs f/u Please schedule this patient for a follow-up appointment and call them with that information.  Primary Cardiologist: Hochrein Date of Discharge: 02/08/2018 Appointment Needed Within: after 03/14/18 Appointment Type: we wanted him to be seen for TOC but he indicates he cannot follow up until after 03/14/18 due to financial issues. Can you still get him his [facility] phone call? Thanks.   He does need a BMET in 1 week. I will put order in.  Thank you! Dayna Dunn PA-C

## 2018-02-09 NOTE — Patient Outreach (Signed)
[facility]) Care Management  02/09/2018  JABBAR KULIGOWSKI 02-May-1955 [id number]  Initial Outreach   Initial outreach attempt call to pt today. Two numbers listed for pt unsuccessful. RN CM left HIPAA compliant voicemail message along with contact info to spouse's number and will await a call back however no no response will scheduled another follow up call in a few days.  Elliot Cousin, RN Care Management Coordinator [facility] Main Office [phone]

## 2018-02-11 ENCOUNTER — Other Ambulatory Visit: Payer: Self-pay | Admitting: *Deleted

## 2018-02-11 NOTE — Patient Outreach (Signed)
[facility]) Care Management  02/11/2018  Darrell [name] 04/16/1955 [id number]    Transition of care  RN's 2nd outreach attempt however remain unsuccessful however RN able to leave a HIPAA approved voice message requesting a call back. Will follow up one additional call and allow patient and/or caregivers to return the call based upon the voice messages and outreach letter sent to this pt.   Elliot Cousin, RN Care Management Coordinator [facility] Main Office [phone]

## 2018-02-13 NOTE — Telephone Encounter (Signed)
Patient is scheduled for a TOC appt with Joni Reining on 03/10/2018, requested by Ronie Spies

## 2018-02-13 NOTE — Telephone Encounter (Signed)
Attempted to contact pt. Was told we had the wrong number.

## 2018-02-17 ENCOUNTER — Other Ambulatory Visit: Payer: Self-pay | Admitting: *Deleted

## 2018-02-17 NOTE — Patient Outreach (Signed)
[facility]) Care Management  02/17/2018  AC GLADE 05-09-1955 [id number]    Outreach attempt # 3  to patient. No answer. RN CM left HIPAA compliant voicemail message along with contact info. Note history of all contact numbers called have been unsuccessful. Will close this case by the end of this week (10 days).  Elliot Cousin, RN Care Management Coordinator [facility] Main Office [phone]

## 2018-02-23 ENCOUNTER — Other Ambulatory Visit: Payer: Self-pay | Admitting: *Deleted

## 2018-02-23 NOTE — Patient Outreach (Signed)
[facility]) Care Management  02/23/2018  Darrell GunningSteven S [name] 08-Dec-1955 [id number]    Unsuccessful contacts and no response to the outreach letter sent to pt. Will close and notify the provider of pt's disposition. Case will be closed.  Elliot CousinLisa Michal Callicott, RN Care Management Coordinator [facility] Main Office [phone]

## 2018-03-10 ENCOUNTER — Ambulatory Visit: Payer: Medicare Other | Admitting: Adult Health

## 2018-03-12 ENCOUNTER — Ambulatory Visit: Payer: Medicare Other | Attending: Family Medicine | Admitting: Family Medicine

## 2018-03-12 VITALS — BP 170/106 | HR 80 | Temp 97.8°F | Ht 73.0 in | Wt 227.6 lb

## 2018-03-12 DIAGNOSIS — I739 Peripheral vascular disease, unspecified: Secondary | ICD-10-CM

## 2018-03-12 DIAGNOSIS — Z1211 Encounter for screening for malignant neoplasm of colon: Secondary | ICD-10-CM | POA: Diagnosis not present

## 2018-03-12 DIAGNOSIS — R0989 Other specified symptoms and signs involving the circulatory and respiratory systems: Secondary | ICD-10-CM | POA: Diagnosis not present

## 2018-03-12 DIAGNOSIS — E785 Hyperlipidemia, unspecified: Secondary | ICD-10-CM | POA: Diagnosis not present

## 2018-03-12 DIAGNOSIS — Z7982 Long term (current) use of aspirin: Secondary | ICD-10-CM | POA: Insufficient documentation

## 2018-03-12 DIAGNOSIS — E1159 Type 2 diabetes mellitus with other circulatory complications: Secondary | ICD-10-CM | POA: Diagnosis not present

## 2018-03-12 DIAGNOSIS — Z7902 Long term (current) use of antithrombotics/antiplatelets: Secondary | ICD-10-CM | POA: Insufficient documentation

## 2018-03-12 DIAGNOSIS — E7800 Pure hypercholesterolemia, unspecified: Secondary | ICD-10-CM | POA: Diagnosis not present

## 2018-03-12 DIAGNOSIS — N183 Chronic kidney disease, stage 3 (moderate): Secondary | ICD-10-CM | POA: Insufficient documentation

## 2018-03-12 DIAGNOSIS — Z955 Presence of coronary angioplasty implant and graft: Secondary | ICD-10-CM | POA: Diagnosis not present

## 2018-03-12 DIAGNOSIS — I129 Hypertensive chronic kidney disease with stage 1 through stage 4 chronic kidney disease, or unspecified chronic kidney disease: Secondary | ICD-10-CM | POA: Insufficient documentation

## 2018-03-12 DIAGNOSIS — Z23 Encounter for immunization: Secondary | ICD-10-CM | POA: Diagnosis not present

## 2018-03-12 DIAGNOSIS — E783 Hyperchylomicronemia: Secondary | ICD-10-CM

## 2018-03-12 DIAGNOSIS — E1151 Type 2 diabetes mellitus with diabetic peripheral angiopathy without gangrene: Secondary | ICD-10-CM | POA: Insufficient documentation

## 2018-03-12 DIAGNOSIS — Z8249 Family history of ischemic heart disease and other diseases of the circulatory system: Secondary | ICD-10-CM | POA: Insufficient documentation

## 2018-03-12 DIAGNOSIS — I2510 Atherosclerotic heart disease of native coronary artery without angina pectoris: Secondary | ICD-10-CM | POA: Diagnosis not present

## 2018-03-12 DIAGNOSIS — E1122 Type 2 diabetes mellitus with diabetic chronic kidney disease: Secondary | ICD-10-CM | POA: Insufficient documentation

## 2018-03-12 DIAGNOSIS — Z951 Presence of aortocoronary bypass graft: Secondary | ICD-10-CM | POA: Insufficient documentation

## 2018-03-12 DIAGNOSIS — Z7984 Long term (current) use of oral hypoglycemic drugs: Secondary | ICD-10-CM | POA: Diagnosis not present

## 2018-03-12 DIAGNOSIS — Z9114 Patient's other noncompliance with medication regimen: Secondary | ICD-10-CM | POA: Insufficient documentation

## 2018-03-12 DIAGNOSIS — Z9119 Patient's noncompliance with other medical treatment and regimen: Secondary | ICD-10-CM | POA: Diagnosis not present

## 2018-03-12 DIAGNOSIS — I214 Non-ST elevation (NSTEMI) myocardial infarction: Secondary | ICD-10-CM

## 2018-03-12 DIAGNOSIS — Z79899 Other long term (current) drug therapy: Secondary | ICD-10-CM | POA: Diagnosis not present

## 2018-03-12 DIAGNOSIS — I252 Old myocardial infarction: Secondary | ICD-10-CM | POA: Diagnosis not present

## 2018-03-12 DIAGNOSIS — Z91199 Patient's noncompliance with other medical treatment and regimen due to unspecified reason: Secondary | ICD-10-CM

## 2018-03-12 DIAGNOSIS — I10 Essential (primary) hypertension: Secondary | ICD-10-CM | POA: Diagnosis not present

## 2018-03-12 LAB — GLUCOSE, POCT (MANUAL RESULT ENTRY): POC Glucose: 111 mg/dl — AB (ref 70–99)

## 2018-03-12 MED ORDER — ASPIRIN 81 MG PO TBEC: 81.0000 mg | DELAYED_RELEASE_TABLET | Freq: Every day | ORAL | 3 refills | Status: DC

## 2018-03-12 MED ORDER — LOSARTAN POTASSIUM 100 MG PO TABS: 100.0000 mg | ORAL_TABLET | Freq: Every day | ORAL | 6 refills | Status: DC

## 2018-03-12 MED ORDER — ISOSORBIDE MONONITRATE ER 120 MG PO TB24: 120.0000 mg | ORAL_TABLET | Freq: Every day | ORAL | 6 refills | Status: DC

## 2018-03-12 MED ORDER — NITROGLYCERIN 0.4 MG SL SUBL: 0.4000 mg | SUBLINGUAL_TABLET | SUBLINGUAL | 2 refills | Status: DC | PRN

## 2018-03-12 MED ORDER — AMLODIPINE BESYLATE 10 MG PO TABS: 10.0000 mg | ORAL_TABLET | Freq: Every day | ORAL | 6 refills | Status: DC

## 2018-03-12 MED ORDER — ATORVASTATIN CALCIUM 80 MG PO TABS: 80.0000 mg | ORAL_TABLET | Freq: Every evening | ORAL | 6 refills | Status: DC

## 2018-03-12 MED ORDER — METFORMIN HCL 500 MG PO TABS: 500.0000 mg | ORAL_TABLET | Freq: Two times a day (BID) | ORAL | 6 refills | Status: DC

## 2018-03-12 MED ORDER — GLUCOSE BLOOD VI STRP: 1.0000 | ORAL_STRIP | Freq: Three times a day (TID) | 12 refills | Status: DC

## 2018-03-12 MED ORDER — CLOPIDOGREL BISULFATE 75 MG PO TABS: 75.0000 mg | ORAL_TABLET | Freq: Every day | ORAL | 6 refills | Status: DC

## 2018-03-12 MED ORDER — CARVEDILOL 25 MG PO TABS: 25.0000 mg | ORAL_TABLET | Freq: Two times a day (BID) | ORAL | 6 refills | Status: DC

## 2018-03-12 MED FILL — ATORVASTATIN 80 MG TABLET: 80 | 30 days supply | Qty: 30 | Fill #0

## 2018-03-12 MED FILL — CLOPIDOGREL 75 MG TABLET: 75 | 30 days supply | Qty: 30 | Fill #0

## 2018-03-12 MED FILL — TRUE METRIX TEST STRIP: 30 days supply | Qty: 100 | Fill #0

## 2018-03-12 MED FILL — metFORMIN HCL 500 MG TABS: 500 | 30 days supply | Qty: 60 | Fill #0

## 2018-03-12 MED FILL — CARVEDILOL 25 MG TABLET: 25 | 30 days supply | Qty: 60 | Fill #0

## 2018-03-12 MED FILL — ISOSORBIDE MN 120 MG TAB SA: 120 | 30 days supply | Qty: 30 | Fill #0

## 2018-03-12 MED FILL — LOSARTAN POTASSIUM 100 MG T: 100 | 30 days supply | Qty: 30 | Fill #0

## 2018-03-12 MED FILL — NITROGLYCERIN 0.4 MG TAB SL: 0.4 | 25 days supply | Qty: 25 | Fill #0

## 2018-03-12 MED FILL — AMLODIPINE BESYLATE 10 MG T: 10 | 30 days supply | Qty: 30 | Fill #0

## 2018-03-12 NOTE — Progress Notes (Signed)
Subjective:  Patient ID: Darrell [name], male    DOB: May 11, 1955  Age: 63 y.o. MRN: [id number]  CC: Hospitalization Follow-up   HPI Darrell [name] is a 62 year old male with a history of coronary artery disease (status post CABG in 2009, status post stent in 03/2015) with type 2 diabetes mellitus (A1c 5.4, hypertension who presents For a follow up from hospitalization at [facility] from 02/05/2018 through 02/08/2018 for NSTEMI.  He was in hypertensive emergency at presentation, troponins were elevated, commenced on IV heparin and antihypertensives.   Echocardiogram revealed: Study Conclusions  - Procedure narrative: Transthoracic echocardiography. Image   quality was poor. The study was technically difficult, as a   result of poor sound wave transmission. - Left ventricle: The cavity size was normal. There was moderate   concentric hypertrophy. Systolic function was normal. The   estimated ejection fraction was in the range of 55% to 60%.   Hypokinesis of the entireinferior myocardium; consistent with   ischemia or infarction in the distribution of the right coronary   artery. Features are consistent with a pseudonormal left   ventricular filling pattern, with concomitant abnormal relaxation   and increased filling pressure (grade 2 diastolic dysfunction). - Left atrium: The atrium was moderately dilated. - Right atrium: The atrium was mildly dilated.  He declined cardiac cath, Plavix was commenced with plans for cardiac rehab to be revisited as an outpatient.    He has been out of his medications and his blood pressure is elevated today.  He plans to pick up his prescriptions today.  Denies chest pain, dyspnea, pedal edema.  Past Medical History:  Diagnosis Date  . CKD (chronic kidney disease), stage III ([place])   . Claudication ([place])   . Coronary artery disease    a. 08/2001 s/p DES to RCA/RI;  b. 07/2006 s/p DES to p/d LCX;  c. 11/2007 CABGx5 (LIMA->LAD, VG->D1,  LRA->OM1, VG->PDA->LPL); c. 03/2015: Synergy DES to mid-Cx in the setting of NSTEMI; d. 11/2015 NSTEMI->Med Rx. e. NSTEMI 01/2018 -> med rx.  . Diabetes mellitus type 2 in nonobese ([place])    a. A1c 7.0 09/2012.  . Diastolic dysfunction    a. 02/2016 Echo: EF 50-55%, gr1DD, Ao sclerosis, dil Ao root (4m), sev dil LA, triv TR, PASP 276mg.  . Marland Kitchenyperlipidemia   . Hypertension   . Noncompliance   . Right carotid bruit    a. noted 01/2018, will need OP eval when patient complies with follow-up.    Past Surgical History:  Procedure Laterality Date  . CARDIAC CATHETERIZATION N/A 04/06/2015   Procedure: Left Heart Cath and Cors/Grafts Angiography;  Surgeon: JoLorretta HarpMD;  [facility];  Service: Cardiovascular;  Laterality: N/A;  . CARDIAC CATHETERIZATION N/A 04/06/2015   Procedure: Coronary Stent Intervention;  Surgeon: JoLorretta HarpMD;  2.5 mm x 12 mm long Synergy DES followed by  2.5 mm x 16 mm long Synergy DES    . CORONARY ARTERY BYPASS GRAFT     2009 LIMA to LAD, SVG to Diag, SVG to PDA and PL, left radial to OM  . LEFT HEART CATH N/A 10/24/2012   Procedure: LEFT HEART CATH;  Surgeon: ThTroy SineMD;  [facility];  Service: Cardiovascular;  Laterality: N/A;    Family History  Problem Relation Age of Onset  . Heart failure Mother   . Cancer Mother   . CAD Mother 7611. Heart failure Father   .  Kidney failure Brother     No Known Allergies  Outpatient Medications Prior to Visit  Medication Sig Dispense Refill  . amLODipine (NORVASC) 10 MG tablet Take 1 tablet (10 mg total) by mouth daily. 30 tablet 6  . aspirin 81 MG EC tablet Take 1 tablet (81 mg total) by mouth daily. 90 tablet 3  . atorvastatin (LIPITOR) 80 MG tablet Take 1 tablet (80 mg total) by mouth every evening. 30 tablet 6  . Blood Glucose Monitoring Suppl (TRUE METRIX METER) w/Device KIT USE AS INSTRUCTED 1 kit 0  . carvedilol (COREG) 25 MG tablet Take 1 tablet (25 mg total) by mouth 2  (two) times daily with a meal. 60 tablet 6  . clopidogrel (PLAVIX) 75 MG tablet Take 1 tablet (75 mg total) by mouth daily. 30 tablet 6  . isosorbide mononitrate (IMDUR) 120 MG 24 hr tablet Take 1 tablet (120 mg total) by mouth daily. 30 tablet 6  . Lancet Devices (ACCU-CHEK SOFTCLIX) lancets Use as instructed daily. 1 each 5  . losartan (COZAAR) 100 MG tablet Take 1 tablet (100 mg total) by mouth daily. 30 tablet 6  . nitroGLYCERIN (NITROSTAT) 0.4 MG SL tablet Place 1 tablet (0.4 mg total) under the tongue every 5 (five) minutes as needed for chest pain (up to 3 doses. If taking 3rd dose call 911). 25 tablet 2  . TRUEPLUS LANCETS 28G MISC USE AS INSTRUCTED 100 each 5  . glucose blood (TRUE METRIX BLOOD GLUCOSE TEST) test strip Use as instructed 100 each 12  . metFORMIN (GLUCOPHAGE) 500 MG tablet Take 1 tablet (500 mg total) by mouth 2 (two) times daily with a meal. 60 tablet 3   No facility-administered medications prior to visit.      ROS Review of Systems  Constitutional: Negative for activity change and appetite change.  HENT: Negative for sinus pressure and sore throat.   Eyes: Negative for visual disturbance.  Respiratory: Negative for cough, chest tightness and shortness of breath.   Cardiovascular: Negative for chest pain and leg swelling.  Gastrointestinal: Negative for abdominal distention, abdominal pain, constipation and diarrhea.  Endocrine: Negative.   Genitourinary: Negative for dysuria.  Musculoskeletal: Negative for joint swelling and myalgias.  Skin: Negative for rash.  Allergic/Immunologic: Negative.   Neurological: Negative for weakness, light-headedness and numbness.  Psychiatric/Behavioral: Negative for dysphoric mood and suicidal ideas.    Objective:  BP (!) 170/106   Pulse 80   Temp 97.8 F (36.6 C) (Oral)   Ht 6' 1" (1.854 m)   Wt 227 lb 9.6 oz (103.2 kg)   SpO2 100%   BMI 30.03 kg/m   BP/Weight 03/12/2018 06/02/3974 07/31/4191  Systolic BP 790 240 973    Diastolic BP 532 85 992  Wt. (Lbs) 227.6 221.1 203  BMI 30.03 29.17 26.78      Physical Exam Constitutional:      Appearance: He is well-developed.  Cardiovascular:     Rate and Rhythm: Normal rate.     Pulses:          Carotid pulses are on the left side with bruit.      Dorsalis pedis pulses are 0 on the right side and 0 on the left side.       Posterior tibial pulses are 0 on the right side and 0 on the left side.     Heart sounds: Normal heart sounds. No murmur.  Pulmonary:     Effort: Pulmonary effort is normal.  Breath sounds: Normal breath sounds. No wheezing or rales.  Chest:     Chest wall: No tenderness.  Abdominal:     General: Bowel sounds are normal. There is no distension.     Palpations: Abdomen is soft. There is no mass.     Tenderness: There is no abdominal tenderness.  Musculoskeletal: Normal range of motion.     Comments: Yellowish discoloration of dorsum of both feet, poor foot hygiene with dystrophic thickened toenails bilaterally  Neurological:     Mental Status: He is alert and oriented to person, place, and time.     CMP Latest Ref Rng & Units 02/07/2018 02/06/2018 02/05/2018  Glucose 70 - 99 mg/dL 93 97 139(H)  BUN 8 - 23 mg/dL 28(H) 26(H) 28(H)  Creatinine 0.61 - 1.24 mg/dL 1.28(H) 1.36(H) 1.47(H)  Sodium 135 - 145 mmol/L 136 137 137  Potassium 3.5 - 5.1 mmol/L 4.1 4.3 4.7  Chloride 98 - 111 mmol/L 108 107 106  CO2 22 - 32 mmol/L 21(L) 21(L) 25  Calcium 8.9 - 10.3 mg/dL 8.3(L) 8.6(L) 8.9  Total Protein 6.5 - 8.1 g/dL - - 6.5  Total Bilirubin 0.3 - 1.2 mg/dL - - 0.7  Alkaline Phos 38 - 126 U/L - - 68  AST 15 - 41 U/L - - 16  ALT 0 - 44 U/L - - 12    Lipid Panel     Component Value Date/Time   CHOL 138 03/03/2016 0235   TRIG 48 03/03/2016 0235   HDL 28 (L) 03/03/2016 0235   CHOLHDL 4.9 03/03/2016 0235   VLDL 10 03/03/2016 0235   LDLCALC 100 (H) 03/03/2016 0235    CBC    Component Value Date/Time   WBC 6.9 02/08/2018 0357   RBC  3.99 (L) 02/08/2018 0357   HGB 11.6 (L) 02/08/2018 0357   HCT 35.5 (L) 02/08/2018 0357   PLT 178 02/08/2018 0357   MCV 89.0 02/08/2018 0357   MCH 29.1 02/08/2018 0357   MCHC 32.7 02/08/2018 0357   RDW 14.5 02/08/2018 0357   LYMPHSABS 0.8 02/05/2018 1528   MONOABS 0.4 02/05/2018 1528   EOSABS 0.0 02/05/2018 1528   BASOSABS 0.0 02/05/2018 1528    Lab Results  Component Value Date   HGBA1C 5.2 02/05/2018    Assessment & Plan:   1. Type 2 diabetes mellitus with other circulatory complication, without long-term current use of insulin (HCC) Controlled with A1c of 5.2 Continue current management Counseled on Diabetic diet, my plate method, 299 minutes of moderate intensity exercise/week Keep blood sugar logs with fasting goals of 80-120 mg/dl, random of less than 180 and in the event of sugars less than 60 mg/dl or greater than 400 mg/dl please notify the clinic ASAP. It is recommended that you undergo annual eye exams and annual foot exams. Pneumonia vaccine is recommended. - POCT glucose (manual entry) - glucose blood (TRUE METRIX BLOOD GLUCOSE TEST) test strip; 1 each by Other route 3 (three) times daily.  Dispense: 100 each; Refill: 12 - metFORMIN (GLUCOPHAGE) 500 MG tablet; Take 1 tablet (500 mg total) by mouth 2 (two) times daily with a meal.  Dispense: 60 tablet; Refill: 6 - Ambulatory referral to Podiatry  2. Atherosclerosis of native coronary artery of native heart without angina pectoris S/p CABG Risk factor modification - VAS US CAROTID; Future  3. History of noncompliance with medical treatment, presenting hazards to health Finances a major problem We have spoken to the pharmacy to assist with this  4. Hypercholesterolemia  Continue statin  5. NSTEMI (non-ST elevated myocardial infarction) ([place]) No angina at this time Risk factor modification Follow up with Cardiology  6. Screening for colon cancer - Ambulatory referral to Gastroenterology  7. Bruit of right  carotid artery Will need to exclude carotid stenosis - VAS US CAROTID; Future  8.  PAD In office ABI positive for PAD Referred to Vascular  9. Hypertension Uncontrolled due to non compliance Advised to pick up medications from pharmacy  Meds ordered this encounter  Medications  . glucose blood (TRUE METRIX BLOOD GLUCOSE TEST) test strip    Sig: 1 each by Other route 3 (three) times daily.    Dispense:  100 each    Refill:  12  . metFORMIN (GLUCOPHAGE) 500 MG tablet    Sig: Take 1 tablet (500 mg total) by mouth 2 (two) times daily with a meal.    Dispense:  60 tablet    Refill:  6    Follow-up: Return in about 2 weeks (around 03/26/2018) for Follow-up of hypertension with Lurena Joiner; 3 months with PCP.       [name], MD, FAAFP. [facility] and [facility], [place]   03/12/2018, 9:37 AM

## 2018-03-12 NOTE — Patient Instructions (Signed)

## 2018-03-13 ENCOUNTER — Encounter: Payer: Self-pay | Admitting: Family Medicine

## 2018-03-16 ENCOUNTER — Ambulatory Visit (HOSPITAL_COMMUNITY): Admission: RE | Admit: 2018-03-16 | Payer: Medicare Other | Source: Ambulatory Visit

## 2018-03-26 ENCOUNTER — Encounter: Payer: Medicare Other | Admitting: Pharmacist

## 2018-04-06 NOTE — Progress Notes (Deleted)
Cardiology Office Note   Date:  04/06/2018   ID:  Darrell [name], DOB 07/13/1955, MRN [id number]  PCP:  Newlin, Enobong, MD  Cardiologist:  Hochrein  No chief complaint on file.    History of Present Illness: Darrell [name] is a 63 y.o. male who presents for ongoing assessment and management of CAD s/p multiple PCI's and CABG, hypertension, HL, with other history of CKD Stage III, Type II diabetes, and long history of medical non-compliance.   Of the multiple PCI's, he has stents to prox-mid RCA and RI 08/2001, DES to distal Cx & DES to prox Cx 07/2006, CABG 11/2007, and NSTEMI 03/2015 s/p PTCA/DES to native Cx for ISR. Last cardiac catheterization was in 03/2015. At that time, he had a patent radial graft to an obtuse marginal branch and a patent LIMA to the mid LAD.The distal LAD was diffusely disease. 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  He has had several admissions for hypertensive urgency and chest pain. He was ruled out for ACS, he was increased on losartan to 100 mg. He is being followed by [facility]. Seen recently on 03/12/2018. He was to have carotid ultrasound due to right carotid bruit.   Past Medical History:  Diagnosis Date  . CKD (chronic kidney disease), stage III (HCC)   . Claudication (HCC)   . Coronary artery disease    a. 08/2001 s/p DES to RCA/RI;  b. 07/2006 s/p DES to p/d LCX;  c. 11/2007 CABGx5 (LIMA->LAD, VG->D1, LRA->OM1, VG->PDA->LPL); c. 03/2015: Synergy DES to mid-Cx in the setting of NSTEMI; d. 11/2015 NSTEMI->Med Rx. e. NSTEMI 01/2018 -> med rx.  . Diabetes mellitus type 2 in nonobese  (HCC)    a. A1c 7.0 09/2012.  . Diastolic dysfunction    a. 02/2016 Echo: EF 50-55%, gr1DD, Ao sclerosis, dil Ao root (42mm), sev dil LA, triv TR, PASP 22mmHg.  . Hyperlipidemia   . Hypertension   . Noncompliance   . Right carotid bruit    a. noted 01/2018, will need OP eval when patient complies with follow-up.    Past Surgical History:  Procedure Laterality Date  . CARDIAC CATHETERIZATION N/A 04/06/2015   Procedure: Left Heart Cath and Cors/Grafts Angiography;  Surgeon: Jonathan J Berry, MD;  [facility];  Service: Cardiovascular;  Laterality: N/A;  . CARDIAC CATHETERIZATION N/A 04/06/2015   Procedure: Coronary Stent Intervention;  Surgeon: Jonathan J Berry, MD;  2.5 mm x 12 mm long Synergy DES followed by  2.5 mm x 16 mm long Synergy DES    . CORONARY ARTERY BYPASS GRAFT     2009 LIMA to LAD, SVG to Diag, SVG to PDA and PL, left radial to OM  . LEFT HEART CATH N/A 10/24/2012   Procedure: LEFT HEART CATH;  Surgeon: Thomas A Kelly, MD;  Location: [facility] CATH LAB;  Service: Cardiovascular;  Laterality: N/A;     Current Outpatient Medications  Medication Sig Dispense Refill  . amLODipine (NORVASC) 10 MG tablet Take 1 tablet (10 mg total) by mouth daily. 30 tablet 6  . aspirin 81 MG EC tablet Take 1 tablet (81 mg total) by mouth daily. 90 tablet   3  . atorvastatin (LIPITOR) 80 MG tablet Take 1 tablet (80 mg total) by mouth every evening. 30 tablet 6  . Blood Glucose Monitoring Suppl (TRUE METRIX METER) w/Device KIT USE AS INSTRUCTED 1 kit 0  . carvedilol (COREG) 25 MG tablet Take 1 tablet (25 mg total) by mouth 2 (two) times daily with a meal. 60 tablet 6  . clopidogrel (PLAVIX) 75 MG tablet Take 1 tablet (75 mg total) by mouth daily. 30 tablet 6  . glucose blood (TRUE METRIX BLOOD GLUCOSE TEST) test strip 1 each by Other route 3 (three) times daily. 100 each 12  . isosorbide mononitrate (IMDUR) 120 MG 24 hr tablet Take 1 tablet (120 mg total) by mouth daily. 30 tablet 6  . Lancet  Devices (ACCU-CHEK SOFTCLIX) lancets Use as instructed daily. 1 each 5  . losartan (COZAAR) 100 MG tablet Take 1 tablet (100 mg total) by mouth daily. 30 tablet 6  . metFORMIN (GLUCOPHAGE) 500 MG tablet Take 1 tablet (500 mg total) by mouth 2 (two) times daily with a meal. 60 tablet 6  . nitroGLYCERIN (NITROSTAT) 0.4 MG SL tablet Place 1 tablet (0.4 mg total) under the tongue every 5 (five) minutes as needed for chest pain (up to 3 doses. If taking 3rd dose call 911). 25 tablet 2  . TRUEPLUS LANCETS 28G MISC USE AS INSTRUCTED 100 each 5   No current facility-administered medications for this visit.     Allergies:   Patient has no known allergies.    Social History:  The patient  reports that he quit smoking about 7 years ago. He has never used smokeless tobacco. He reports that he does not drink alcohol or use drugs.   Family History:  The patient's family history includes CAD (age of onset: 67) in his mother; Cancer in his mother; Heart failure in his father and mother; Kidney failure in his brother.    ROS: All other systems are reviewed and negative. Unless otherwise mentioned in H&P    PHYSICAL EXAM: VS:  There were no vitals taken for this visit. , BMI There is no height or weight on file to calculate BMI. GEN: Well nourished, well developed, in no acute distress HEENT: normal Neck: no JVD, carotid bruits, or masses Cardiac: ***RRR; no murmurs, rubs, or gallops,no edema  Respiratory:  Clear to auscultation bilaterally, normal work of breathing GI: soft, nontender, nondistended, + BS MS: no deformity or atrophy Skin: warm and dry, no rash Neuro:  Strength and sensation are intact Psych: euthymic mood, full affect   EKG:  EKG {ACTION; IS/IS [id number]} ordered today. The ekg ordered today demonstrates ***   Recent Labs: 02/05/2018: ALT 12 02/07/2018: BUN 28; Creatinine, Ser 1.28; Potassium 4.1; Sodium 136 02/08/2018: Hemoglobin 11.6; Platelets 178    Lipid Panel      Component Value Date/Time   CHOL 138 03/03/2016 0235   TRIG 48 03/03/2016 0235   HDL 28 (L) 03/03/2016 0235   CHOLHDL 4.9 03/03/2016 0235   VLDL 10 03/03/2016 0235   LDLCALC 100 (H) 03/03/2016 0235      Wt Readings from Last 3 Encounters:  03/12/18 227 lb 9.6 oz (103.2 kg)  02/08/18 221 lb 1.6 oz (100.3 kg)  03/30/17 203 lb (92.1 kg)      Other studies Reviewed:   2D echo 02/06/2018 Study Conclusions  - Procedure narrative: Transthoracic echocardiography. Image quality was poor. The study was technically difficult, as a result of poor sound wave transmission. - Left ventricle:  The cavity size was normal. There was moderate concentric hypertrophy. Systolic function was normal. The estimated ejection fraction was in the range of 55% to 60%. Hypokinesis of the entireinferior myocardium; consistent with ischemia or infarction in the distribution of the right coronary artery. Features are consistent with a pseudonormal left ventricular filling pattern, with concomitant abnormal relaxation and increased filling pressure (grade 2 diastolic dysfunction). - Left atrium: The atrium was moderately dilated. - Right atrium: The atrium was mildly dilated.    _____________     ASSESSMENT AND PLAN:  1.  ***   Current medicines are reviewed at length with the patient today.    Labs/ tests ordered today include: *** Phill Myron. [place], ANP, AACC   04/06/2018 12:50 PM    [address] Office [phone] Fax [phone]

## 2018-04-08 ENCOUNTER — Ambulatory Visit: Payer: Medicare Other | Admitting: Adult Health

## 2018-04-09 ENCOUNTER — Encounter: Payer: Self-pay | Admitting: *Deleted

## 2018-05-01 MED FILL — CLOPIDOGREL 75 MG TABLET: 75 | 30 days supply | Qty: 30 | Fill #1

## 2018-05-01 MED FILL — ATORVASTATIN 80 MG TABLET: 80 | 30 days supply | Qty: 30 | Fill #1

## 2018-05-01 MED FILL — ISOSORBIDE MN 120 MG TAB SA: 120 | 30 days supply | Qty: 30 | Fill #1

## 2018-05-01 MED FILL — LOSARTAN POTASSIUM 100 MG T: 100 | 30 days supply | Qty: 30 | Fill #1

## 2018-05-01 MED FILL — AMLODIPINE BESYLATE 10 MG T: 10 | 30 days supply | Qty: 30 | Fill #1

## 2018-05-01 MED FILL — NITROGLYCERIN 0.4 MG TAB SL: 0.4 | 25 days supply | Qty: 25 | Fill #1

## 2018-05-01 MED FILL — CARVEDILOL 25 MG TABLET: 25 | 30 days supply | Qty: 60 | Fill #1

## 2018-05-01 MED FILL — metFORMIN HCL 500 MG TABS: 500 | 30 days supply | Qty: 60 | Fill #1

## 2018-05-03 ENCOUNTER — Other Ambulatory Visit: Payer: Self-pay

## 2018-05-03 ENCOUNTER — Encounter (HOSPITAL_COMMUNITY): Payer: Self-pay | Admitting: Emergency Medicine

## 2018-05-03 ENCOUNTER — Emergency Department (HOSPITAL_COMMUNITY): Payer: Medicare Other

## 2018-05-03 ENCOUNTER — Inpatient Hospital Stay (HOSPITAL_COMMUNITY)
Admission: EM | Admit: 2018-05-03 | Discharge: 2018-05-06 | DRG: 251 | Disposition: A | Discharge status: Home / Self Care | Payer: Medicare Other | Attending: Cardiovascular Disease | Admitting: Cardiovascular Disease

## 2018-05-03 DIAGNOSIS — I214 Non-ST elevation (NSTEMI) myocardial infarction: Principal | ICD-10-CM

## 2018-05-03 DIAGNOSIS — I161 Hypertensive emergency: Secondary | ICD-10-CM

## 2018-05-03 DIAGNOSIS — Z7902 Long term (current) use of antithrombotics/antiplatelets: Secondary | ICD-10-CM

## 2018-05-03 DIAGNOSIS — Z9119 Patient's noncompliance with other medical treatment and regimen: Secondary | ICD-10-CM

## 2018-05-03 DIAGNOSIS — Z7982 Long term (current) use of aspirin: Secondary | ICD-10-CM

## 2018-05-03 DIAGNOSIS — Z8249 Family history of ischemic heart disease and other diseases of the circulatory system: Secondary | ICD-10-CM

## 2018-05-03 DIAGNOSIS — E785 Hyperlipidemia, unspecified: Secondary | ICD-10-CM

## 2018-05-03 DIAGNOSIS — Z841 Family history of disorders of kidney and ureter: Secondary | ICD-10-CM

## 2018-05-03 DIAGNOSIS — K219 Gastro-esophageal reflux disease without esophagitis: Secondary | ICD-10-CM | POA: Diagnosis present

## 2018-05-03 DIAGNOSIS — E1122 Type 2 diabetes mellitus with diabetic chronic kidney disease: Secondary | ICD-10-CM | POA: Diagnosis present

## 2018-05-03 DIAGNOSIS — Z87891 Personal history of nicotine dependence: Secondary | ICD-10-CM

## 2018-05-03 DIAGNOSIS — I10 Essential (primary) hypertension: Secondary | ICD-10-CM | POA: Diagnosis present

## 2018-05-03 DIAGNOSIS — Z79899 Other long term (current) drug therapy: Secondary | ICD-10-CM | POA: Diagnosis not present

## 2018-05-03 DIAGNOSIS — I249 Acute ischemic heart disease, unspecified: Secondary | ICD-10-CM | POA: Diagnosis present

## 2018-05-03 DIAGNOSIS — R0789 Other chest pain: Secondary | ICD-10-CM | POA: Diagnosis not present

## 2018-05-03 DIAGNOSIS — I252 Old myocardial infarction: Secondary | ICD-10-CM | POA: Diagnosis not present

## 2018-05-03 DIAGNOSIS — N289 Disorder of kidney and ureter, unspecified: Secondary | ICD-10-CM

## 2018-05-03 DIAGNOSIS — E782 Mixed hyperlipidemia: Secondary | ICD-10-CM | POA: Diagnosis not present

## 2018-05-03 DIAGNOSIS — Z9861 Coronary angioplasty status: Secondary | ICD-10-CM

## 2018-05-03 DIAGNOSIS — E119 Type 2 diabetes mellitus without complications: Secondary | ICD-10-CM

## 2018-05-03 DIAGNOSIS — I129 Hypertensive chronic kidney disease with stage 1 through stage 4 chronic kidney disease, or unspecified chronic kidney disease: Secondary | ICD-10-CM | POA: Diagnosis present

## 2018-05-03 DIAGNOSIS — N183 Chronic kidney disease, stage 3 (moderate): Secondary | ICD-10-CM | POA: Diagnosis present

## 2018-05-03 DIAGNOSIS — I25810 Atherosclerosis of coronary artery bypass graft(s) without angina pectoris: Secondary | ICD-10-CM | POA: Diagnosis not present

## 2018-05-03 DIAGNOSIS — I2510 Atherosclerotic heart disease of native coronary artery without angina pectoris: Secondary | ICD-10-CM

## 2018-05-03 DIAGNOSIS — I4891 Unspecified atrial fibrillation: Secondary | ICD-10-CM | POA: Diagnosis not present

## 2018-05-03 DIAGNOSIS — Z9114 Patient's other noncompliance with medication regimen: Secondary | ICD-10-CM | POA: Diagnosis not present

## 2018-05-03 DIAGNOSIS — Z789 Other specified health status: Secondary | ICD-10-CM

## 2018-05-03 DIAGNOSIS — Z7984 Long term (current) use of oral hypoglycemic drugs: Secondary | ICD-10-CM

## 2018-05-03 DIAGNOSIS — R079 Chest pain, unspecified: Secondary | ICD-10-CM | POA: Diagnosis not present

## 2018-05-03 DIAGNOSIS — Z955 Presence of coronary angioplasty implant and graft: Secondary | ICD-10-CM

## 2018-05-03 LAB — BASIC METABOLIC PANEL
Anion gap: 8 (ref 5–15)
BUN: 25 mg/dL — ABNORMAL HIGH (ref 8–23)
CO2: 24 mmol/L (ref 22–32)
Calcium: 8.5 mg/dL — ABNORMAL LOW (ref 8.9–10.3)
Chloride: 106 mmol/L (ref 98–111)
Creatinine, Ser: 1.46 mg/dL — ABNORMAL HIGH (ref 0.61–1.24)
GFR calc Af Amer: 59 mL/min — ABNORMAL LOW (ref >60)
GFR calc non Af Amer: 51 mL/min — ABNORMAL LOW (ref >60)
Glucose, Bld: 130 mg/dL — ABNORMAL HIGH (ref 70–99)
Potassium: 4.1 mmol/L (ref 3.5–5.1)
Sodium: 138 mmol/L (ref 135–145)

## 2018-05-03 LAB — BRAIN NATRIURETIC PEPTIDE: B Natriuretic Peptide: 334.6 pg/mL — ABNORMAL HIGH (ref 0.0–100.0)

## 2018-05-03 LAB — CBC
HCT: 36.6 % — ABNORMAL LOW (ref 39.0–52.0)
Hemoglobin: 12.1 g/dL — ABNORMAL LOW (ref 13.0–17.0)
MCH: 30.2 pg (ref 26.0–34.0)
MCHC: 33.1 g/dL (ref 30.0–36.0)
MCV: 91.3 fL (ref 80.0–100.0)
Platelets: 165 10*3/uL (ref 150–400)
RBC: 4.01 MIL/uL — ABNORMAL LOW (ref 4.22–5.81)
RDW: 14.7 % (ref 11.5–15.5)
WBC: 5.5 10*3/uL (ref 4.0–10.5)
nRBC: 0 % (ref 0.0–0.2)

## 2018-05-03 LAB — TROPONIN I
Troponin I: <0.03 ng/mL (ref <0.03)
Troponin I: 0.29 ng/mL (ref <0.03)

## 2018-05-03 LAB — GLUCOSE, CAPILLARY: Glucose-Capillary: 177 mg/dL — ABNORMAL HIGH (ref 70–99)

## 2018-05-03 LAB — CBG MONITORING, ED: Glucose-Capillary: 91 mg/dL (ref 70–99)

## 2018-05-03 LAB — T4, FREE: Free T4: 1.04 ng/dL (ref 0.82–1.77)

## 2018-05-03 MED ORDER — ISOSORBIDE MONONITRATE ER 30 MG PO TB24
120.0000 mg | ORAL_TABLET | Freq: Once | ORAL | Status: AC
  Administered 2018-05-03: 120 mg via ORAL
  Filled 2018-05-03: qty 4

## 2018-05-03 MED ORDER — HEPARIN BOLUS VIA INFUSION
4000.0000 [IU] | Freq: Once | INTRAVENOUS | Status: AC
  Administered 2018-05-03: 4000 [IU] via INTRAVENOUS
  Filled 2018-05-03: qty 4000

## 2018-05-03 MED ORDER — CLOPIDOGREL BISULFATE 75 MG PO TABS
75.0000 mg | ORAL_TABLET | Freq: Every day | ORAL | Status: DC
  Administered 2018-05-03 – 2018-05-04 (×2): 75 mg via ORAL
  Filled 2018-05-03 (×2): qty 1

## 2018-05-03 MED ORDER — HEPARIN (PORCINE) 25000 UT/250ML-% IV SOLN
1200.0000 [IU]/h | INTRAVENOUS | Status: DC
  Administered 2018-05-03: 1200 [IU]/h via INTRAVENOUS
  Filled 2018-05-03: qty 250

## 2018-05-03 MED ORDER — CARVEDILOL 25 MG PO TABS
25.0000 mg | ORAL_TABLET | Freq: Two times a day (BID) | ORAL | Status: DC
  Administered 2018-05-03 – 2018-05-06 (×6): 25 mg via ORAL
  Filled 2018-05-03: qty 1
  Filled 2018-05-03: qty 2
  Filled 2018-05-03 (×4): qty 1

## 2018-05-03 MED ORDER — METOPROLOL TARTRATE 5 MG/5ML IV SOLN
5.0000 mg | Freq: Once | INTRAVENOUS | Status: AC
  Administered 2018-05-03: 5 mg via INTRAVENOUS
  Filled 2018-05-03: qty 5

## 2018-05-03 MED ORDER — ONDANSETRON HCL 4 MG/2ML IJ SOLN: 4.0000 mg | Freq: Four times a day (QID) | INTRAMUSCULAR | Status: DC | PRN

## 2018-05-03 MED ORDER — ASPIRIN 81 MG PO CHEW
81.0000 mg | CHEWABLE_TABLET | Freq: Every day | ORAL | Status: DC
  Filled 2018-05-03 (×2): qty 1

## 2018-05-03 MED ORDER — LOSARTAN POTASSIUM 50 MG PO TABS
100.0000 mg | ORAL_TABLET | Freq: Once | ORAL | Status: AC
  Administered 2018-05-03: 100 mg via ORAL
  Filled 2018-05-03: qty 2

## 2018-05-03 MED ORDER — AMLODIPINE BESYLATE 5 MG PO TABS
10.0000 mg | ORAL_TABLET | ORAL | Status: AC
  Administered 2018-05-03: 10 mg via ORAL
  Filled 2018-05-03: qty 2

## 2018-05-03 MED ORDER — ACETAMINOPHEN 325 MG PO TABS: 650.0000 mg | ORAL_TABLET | ORAL | Status: DC | PRN

## 2018-05-03 MED ORDER — NITROGLYCERIN 0.4 MG SL SUBL: 0.4000 mg | SUBLINGUAL_TABLET | SUBLINGUAL | Status: DC | PRN

## 2018-05-03 MED ORDER — ATORVASTATIN CALCIUM 80 MG PO TABS
80.0000 mg | ORAL_TABLET | Freq: Every evening | ORAL | Status: DC
  Administered 2018-05-03 – 2018-05-05 (×3): 80 mg via ORAL
  Filled 2018-05-03 (×3): qty 1

## 2018-05-03 NOTE — H&P (Addendum)
Cardiology Admission History and Physical:   Patient ID: Darrell [name] MRN: [id number]; DOB: 1955/11/05   Admission date: 05/03/2018  Primary Care Provider: [name], MD Primary Cardiologist: Minus Breeding, MD Primary Electrophysiologist:  None   Chief Complaint:  Chest pain , missing medications -waiting for mail order  Darrell [name] is a 63 y.o. male who is being seen today for the evaluation of chest pain at the request of Dr. Jeanell Sparrow.   Patient Profile:   Darrell [name] is a 63 y.o. male with hx of h/o CAD s/p prior CABG and multiple interventions, HTN, HL, DM, and long h/o noncompliance, mainly due to inability to afford meds,   History of Present Illness:   Darrell [name] with hx of CABG 2009, with LIMA to LAD, Lt radial graft to marginal VG seq. To the PDA and PLA and reported graft to the diag. With last cath 2014 with occluded VG to PDA and PLA, patent radial graft wo LCX, patent LIMA to LAD with filling retrograde back up to the point of prox. Occlusion, the distal LAD isvery diminutive and has diffuse narrowing with collateralization to the distal RCA.  Treated medically.   Then in 2017 cath was done with patent radial graft to an obtuse marginal branch as a patent LIMA to the mid LAD involving the distal LAD is diffusely diseased throughout its entirety. There are left-to-right collaterals. The native right is occluded as is the graft to the PDA and PLA. He does have high grade "in-stent restenosis" within the proximal codominant circumflex which may be his culprit lesion although he has distal disease as well.  They were able to stent the proximal circumflex however this was a technically challenging the long procedure using 240 mL of contrast. Dr. Gwenlyn Found elected not to intervene on the distal circumflex at this time but rather treat him medically to assess efficacy of revascularization.   Recent admit 02/06/18 with chest pain and HTN and pk troponin of 2.0    Echo at  that time with EF 55-60%, Hypokinesis of the entireinferior myocardium; consistent with ischemia or infarction in the distribution of the right coronary artery.  G2DD LA moderately dilated, and RA mildly dilated. Pt did not wish to have a cardiac cath.  He was placed back on his meds and discharged on 02/08/18.   He was a no show for appt 04/08/18.    Pt now [facility] with chest pain rated a 9/10.  Has not taken his BP medications since yesterday waiting for mail-order supply.  He has had 324 mg ASA today and 1 sl NTG which did decrease pain to 2/10.    Initial troponin was neg but second one 0.29.   Na 138, K+ 4.1 BUN 25, Cr 1.46 Ca 8.5  Hgb 12.1, plts 165, WBC 5.5  CXR no active disease.   EKG:  The ECG that was done 05/03/18 was personally reviewed and demonstrates SR old ant septal MI and improved from 02/06/18  BP on arrival 227/123 and now 167/100.  Has rec'd amlodipine, imdur and losartan 100 mg here.  He tells me the pain was only due to his BP being elevated.  He walks 35 min a day and never has chest pain.     Past Medical History:  Diagnosis Date  . CKD (chronic kidney disease), stage III ([place])   . Claudication ([place])   . Coronary artery disease    a. 08/2001 s/p DES to RCA/RI;  b. 07/2006 s/p DES to p/d LCX;  c. 11/2007 CABGx5 (LIMA->LAD, VG->D1, LRA->OM1, VG->PDA->LPL); c. 03/2015: Synergy DES to mid-Cx in the setting of NSTEMI; d. 11/2015 NSTEMI->Med Rx. e. NSTEMI 01/2018 -> med rx.  . Diabetes mellitus type 2 in nonobese ([place])    a. A1c 7.0 09/2012.  . Diastolic dysfunction    a. 02/2016 Echo: EF 50-55%, gr1DD, Ao sclerosis, dil Ao root (70m), sev dil LA, triv TR, PASP 236mg.  . Marland Kitchenyperlipidemia   . Hypertension   . Noncompliance   . Right carotid bruit    a. noted 01/2018, will need OP eval when patient complies with follow-up.    Past Surgical History:  Procedure Laterality Date  . CARDIAC CATHETERIZATION N/A 04/06/2015   Procedure: Left Heart Cath and  Cors/Grafts Angiography;  Surgeon: JoLorretta HarpMD;  [facility];  Service: Cardiovascular;  Laterality: N/A;  . CARDIAC CATHETERIZATION N/A 04/06/2015   Procedure: Coronary Stent Intervention;  Surgeon: JoLorretta HarpMD;  2.5 mm x 12 mm long Synergy DES followed by  2.5 mm x 16 mm long Synergy DES    . CORONARY ARTERY BYPASS GRAFT     2009 LIMA to LAD, SVG to Diag, SVG to PDA and PL, left radial to OM  . LEFT HEART CATH N/A 10/24/2012   Procedure: LEFT HEART CATH;  Surgeon: ThTroy SineMD;  [facility];  Service: Cardiovascular;  Laterality: N/A;     Medications Prior to Admission: Prior to Admission medications   Medication Sig Start Date End Date Taking? Authorizing Provider  amLODipine (NORVASC) 10 MG tablet Take 1 tablet (10 mg total) by mouth daily. 03/12/18  Yes NeCharlott RakesMD  aspirin 81 MG EC tablet Take 1 tablet (81 mg total) by mouth daily. 03/12/18  Yes NeCharlott RakesMD  atorvastatin (LIPITOR) 80 MG tablet Take 1 tablet (80 mg total) by mouth every evening. 03/12/18  Yes NeCharlott RakesMD  carvedilol (COREG) 25 MG tablet Take 1 tablet (25 mg total) by mouth 2 (two) times daily with a meal. 03/12/18  Yes Newlin, Enobong, MD  clopidogrel (PLAVIX) 75 MG tablet Take 1 tablet (75 mg total) by mouth daily. 03/12/18  Yes NeCharlott RakesMD  isosorbide mononitrate (IMDUR) 120 MG 24 hr tablet Take 1 tablet (120 mg total) by mouth daily. 03/12/18  Yes NeCharlott RakesMD  losartan (COZAAR) 100 MG tablet Take 1 tablet (100 mg total) by mouth daily. 03/12/18  Yes NeCharlott RakesMD  metFORMIN (GLUCOPHAGE) 500 MG tablet Take 1 tablet (500 mg total) by mouth 2 (two) times daily with a meal. 03/12/18  Yes Newlin, Enobong, MD  nitroGLYCERIN (NITROSTAT) 0.4 MG SL tablet Place 1 tablet (0.4 mg total) under the tongue every 5 (five) minutes as needed for chest pain (up to 3 doses. If taking 3rd dose call 911). 03/12/18  Yes Newlin, Enobong, MD  Blood Glucose  Monitoring Suppl (TRUE METRIX METER) w/Device KIT USE AS INSTRUCTED 07/05/16   NeCharlott RakesMD  glucose blood (TRUE METRIX BLOOD GLUCOSE TEST) test strip 1 each by Other route 3 (three) times daily. 03/12/18   NeCharlott RakesMD  Lancet Devices ([facility] Use as instructed daily. 07/05/16   NeCharlott RakesMD  TRUEPLUS LANCETS 28G MISC USE AS INSTRUCTED 07/05/16   NeCharlott RakesMD     Allergies:   No Known Allergies  Social History:   Social History   Socioeconomic History  . Marital status: Married  Spouse name: Not on file  . Number of children: 3  . Years of education: Not on file  . Highest education level: Not on file  Occupational History  . Not on file  Social Needs  . Financial resource strain: Not on file  . Food insecurity:    Worry: Not on file    Inability: Not on file  . Transportation needs:    Medical: Not on file    Non-medical: Not on file  Tobacco Use  . Smoking status: Former Smoker    Last attempt to quit: 10/25/2010    Years since quitting: 7.5  . Smokeless tobacco: Never Used  Substance and Sexual Activity  . Alcohol use: No    Comment: sts he drank for the first time in a long time last night   . Drug use: No  . Sexual activity: Not on file  Lifestyle  . Physical activity:    Days per week: Not on file    Minutes per session: Not on file  . Stress: Not on file  Relationships  . Social connections:    Talks on phone: Not on file    Gets together: Not on file    Attends religious service: Not on file    Active member of club or organization: Not on file    Attends meetings of clubs or organizations: Not on file    Relationship status: Not on file  . Intimate partner violence:    Fear of current or ex partner: Not on file    Emotionally abused: Not on file    Physically abused: Not on file    Forced sexual activity: Not on file  Other Topics Concern  . Not on file  Social History Narrative   Lives at home with wife.       Family History:   The patient's family history includes CAD (age of onset: 73) in his mother; Cancer in his mother; Heart failure in his father and mother; Kidney failure in his brother.    ROS:  Please see the history of present illness.  General:no colds or fevers, no weight changes Skin:no rashes or ulcers HEENT:no blurred vision, no congestion CV:see HPI PUL:see HPI GI:no diarrhea constipation or melena, no indigestion GU:no hematuria, no dysuria MS:no joint pain, no claudication Neuro:no syncope, no lightheadedness Endo:+ diabetes, no thyroid disease .     Physical Exam/Data:   Vitals:   05/03/18 1245 05/03/18 1300 05/03/18 1315 05/03/18 1330  BP:  (!) 162/101  (!) 163/98  Pulse: 75 73 75 68  Resp: '17 17 18 '$ (!) 21  Temp:      TempSrc:      SpO2: 97% 97% 98% 97%  Weight:      Height:       No intake or output data in the 24 hours ending 05/03/18 1356 Last 3 Weights 05/03/2018 03/12/2018 02/08/2018  Weight (lbs) 234 lb 227 lb 9.6 oz 221 lb 1.6 oz  Weight (kg) 106.142 kg 103.239 kg 100.29 kg     Body mass index is 30.87 kg/m.  Exam per Dr. Haroldine Laws   Relevant CV Studies: Echo 02/06/18  Study Conclusions  - Procedure narrative: Transthoracic echocardiography. Image   quality was poor. The study was technically difficult, as a   result of poor sound wave transmission. - Left ventricle: The cavity size was normal. There was moderate   concentric hypertrophy. Systolic function was normal. The   estimated ejection fraction was in the range of  55% to 60%.   Hypokinesis of the entireinferior myocardium; consistent with   ischemia or infarction in the distribution of the right coronary   artery. Features are consistent with a pseudonormal left   ventricular filling pattern, with concomitant abnormal relaxation   and increased filling pressure (grade 2 diastolic dysfunction). - Left atrium: The atrium was moderately dilated. - Right atrium: The atrium was mildly  dilated.   Cardiac cath 04/06/15    Ost LAD to Mid LAD lesion, 100% stenosed. The lesion was previously treated with a stent (unknown type).  Mid RCA lesion, 100% stenosed. The lesion was previously treated with a stent (unknown type).  Dist Cx-1 lesion, 95% stenosed. The lesion was previously treated with a stent (unknown type).  Dist Cx-2 lesion, 95% stenosed.  Origin to Prox Graft lesion, 100% stenosed.  Dist LAD lesion, 95% stenosed.  Ost Cx to Mid Cx lesion, 95% stenosed. Post intervention, there is a 0% residual stenosis. The lesion was previously treated with a stent (unknown type).  There is mild left ventricular systolic dysfunction    Diagnostic  Dominance: Co-dominant    Intervention      Laboratory Data:  Chemistry Recent Labs  Lab 05/03/18 1012  NA 138  K 4.1  CL 106  CO2 24  GLUCOSE 130*  BUN 25*  CREATININE 1.46*  CALCIUM 8.5*  GFRNONAA 51*  GFRAA 59*  ANIONGAP 8    No results for input(s): PROT, ALBUMIN, AST, ALT, ALKPHOS, BILITOT in the last 168 hours. Hematology Recent Labs  Lab 05/03/18 1012  WBC 5.5  RBC 4.01*  HGB 12.1*  HCT 36.6*  MCV 91.3  MCH 30.2  MCHC 33.1  RDW 14.7  PLT 165   Cardiac Enzymes Recent Labs  Lab 05/03/18 1012 05/03/18 1223  TROPONINI <0.03 0.29*   No results for input(s): TROPIPOC in the last 168 hours.  BNPNo results for input(s): BNP, PROBNP in the last 168 hours.  DDimer No results for input(s): DDIMER in the last 168 hours.  Radiology/Studies:  Dg Chest Port 1 View  Result Date: 05/03/2018 CLINICAL DATA:  Acute onset sharp central chest pain. Hypertension. Coronary artery disease. EXAM: PORTABLE CHEST 1 VIEW COMPARISON:  02/05/2018 FINDINGS: Heart size is within normal limits. Both lungs are clear. Prior CABG again noted. IMPRESSION: No active disease. Electronically Signed   By: Earle Gell M.D.   On: 05/03/2018 11:05    Assessment and Plan:   1. Chest pain/NSTEMI with elevated troponin and  known CAD.  May be from hypertension alone but with NSTEMI in 01/2018 and refusal of cath concern would be further CAD.   D/c'd on plavix. He has been taking.  We discussed this was like a failed stress test and he needs cardiac cath.  He does not wish to have.    Dr. Haroldine Laws to see, probable admit IV heparin and NTG control BP and serial troponins.HTN urgency again due to non compliance. In that his meds have not yet arrived. 2. CAD with hx of CABG and graft failure with VG, last PCI in 2017 3. CKD 3 stable to slightly elevated   Cr 1 yr ago 1.06 and in 01/2018 1.47 to 1.28 now 1.46  4. HLD was discharged on lipitor 80 mg   Severity of Illness: The appropriate patient status for this patient is OBSERVATION. Observation status is judged to be reasonable and necessary in order to provide the required intensity of service to ensure the patient's safety. The patient's presenting symptoms,  physical exam findings, and initial radiographic and laboratory data in the context of their medical condition is felt to place them at decreased risk for further clinical deterioration. Furthermore, it is anticipated that the patient will be medically stable for discharge from the hospital within 2 midnights of admission. The following factors support the patient status of observation.   " The patient's presenting symptoms include chest pain, HTN. " The physical exam findings include no acute issues with improvement in chest pain. " The initial radiographic and laboratory data are elevated troponin .     For questions or updates, please contact [place] Please consult [email or web address] for contact info under      Signed, Cecilie Kicks, NP  05/03/2018 1:56 PM   Patient seen and examined with the above-signed Advanced Practice Provider and/or Housestaff. I personally reviewed laboratory data, imaging studies and relevant notes. I independently examined the patient and formulated the important aspects of the plan.  I have edited the note to reflect any of my changes or salient points. I have personally discussed the plan with the patient and/or family.  63 y/o male with CAD s/p CABG with multiple interventions, HTN, DM2, CKD. Recent admit in 1/20 for NSTEMI. Refused cath. Presents to ER today with recurrent CP in setting of severe HTN (ran out of meds). ECG with minimal ST scooping but troponin 0.03 -> 0.29 c/w NSTEMI. Now CP free after NTG and BP meds.   On exam he is comfortable with no evidence of HF.   I had long talk with him about the need for admission and cardiac catheterization in setting of NSTEMI. He is adamant that he will not stay for further w/u. Says he will feel better when his medications come in the mail tomorrow. Michela Pitcher he would come back to ER if he needs it. I clearly expressed to him the high risk of decompensation and possibly death if he leaves the hospital but he is unwilling to change his mind. I d/w with ED team and if he is unwilling to stay he will need to sign out AMA. Will give '5mg'$  IV lopressor now and possible 1 dose of clonidine to optimize BP before he leaves.   Glori Bickers, MD  4:31 PM   Patient agreed to stay. Will start on heparin drip. Plavix already ordered. NPO after MN. Placed on cath lab board.   Cristina Gong, MD

## 2018-05-03 NOTE — ED Triage Notes (Signed)
Patient arrived from home via [facility], with reports of central chest pain of 9, non-radiating. Initial SBP was 232. Patient reports that he has not taken his BP medicine since yesterday because he is waiting for his mail-order supply. He received 324ASA and 1 Nitro which brought his pain down to 2  On arrival patient is A&O X4

## 2018-05-03 NOTE — Progress Notes (Signed)
ANTICOAGULATION CONSULT NOTE - Initial Consult  Pharmacy Consult for heparin Indication: chest pain/ACS  No Known Allergies  Patient Measurements: Height: 6\' 1"  (185.4 cm) Weight: 234 lb (106.1 kg) IBW/kg (Calculated) : 79.9 Heparin Dosing Weight: 101.8 kg  Vital Signs: Temp: 98.3 F (36.8 C) (04/05 0957) Temp Source: Oral (04/05 0957) BP: 146/91 (04/05 1700) Pulse Rate: 76 (04/05 1700)  Labs: Recent Labs    05/03/18 1012 05/03/18 1223  HGB 12.1*  --   HCT 36.6*  --   PLT 165  --   CREATININE 1.46*  --   TROPONINI <0.03 0.29*    Estimated Creatinine Clearance: 67.9 mL/min (A) (by C-G formula based on SCr of 1.46 mg/dL (H)).   Medical History: Past Medical History:  Diagnosis Date  . CKD (chronic kidney disease), stage III ([facility])   . Claudication ([facility])   . Coronary artery disease    a. 08/2001 s/p DES to RCA/RI;  b. 07/2006 s/p DES to p/d LCX;  c. 11/2007 CABGx5 (LIMA->LAD, VG->D1, LRA->OM1, VG->PDA->LPL); c. 03/2015: Synergy DES to mid-Cx in the setting of NSTEMI; d. 11/2015 NSTEMI->Med Rx. e. NSTEMI 01/2018 -> med rx.  . Diabetes mellitus type 2 in nonobese ([facility])    a. A1c 7.0 09/2012.  . Diastolic dysfunction    a. 02/2016 Echo: EF 50-55%, gr1DD, Ao sclerosis, dil Ao root (41mm), sev dil LA, triv TR, PASP .  Marland Kitchen Hyperlipidemia   . Hypertension   . Noncompliance   . Right carotid bruit    a. noted 01/2018, will need OP eval when patient complies with follow-up.    Medications:  Scheduled:  . carvedilol  25 mg Oral BID WC  . heparin  4,000 Units Intravenous Once    Assessment: 62 yom presenting with chest pain. Has hx of CAD s/p prior CABG and PCI, also hx non-compliance. No anticoag PTA.   Hgb 12.1, plt 165. Troponin 0.29. No s/sx of bleeding.   Goal of Therapy:  Heparin level 0.3-0.7 units/ml Monitor platelets by anticoagulation protocol: Yes   Plan:  Give 4000 units bolus x 1 Start heparin infusion at 1200 units/hr Check anti-Xa level in 6 hours  and daily while on heparin Continue to monitor H&H and platelets  Sherron Monday, PharmD, BCCCP Clinical Pharmacist  Pager: [phone] Phone: [phone] 05/03/2018,5:42 PM

## 2018-05-03 NOTE — ED Provider Notes (Signed)
Patient signed out to me from Dr. Rosalia Hammers.  63 year old male with chest pain ruling in for non-STEMI.  Currently being seen by cardiology. Physical Exam  BP (!) 165/93   Pulse 71   Temp 98.3 F (36.8 C) (Oral)   Resp 17   Ht 6\' 1"  (1.854 m)   Wt 106.1 kg   SpO2 97%   BMI 30.87 kg/m   Physical Exam  ED Course/Procedures     Procedures  MDM  Plan is for admission to cardiology.  Patient has been seen by Dr. Gala Romney and is refusing admission.  He understands the risks of death as an outcome to worsening of his condition.  Dr. Gala Romney recommends giving him some IV Lopressor to get his heart rate and blood pressure down.  Also consideration for dose of clonidine before he leaves AMA.  I was later informed by the nurse that the patient wishes to stay in the hospital.  I reconnected with cardiology and they will be down to see the patient and admit.       Terrilee Files, MD 05/04/18 1037

## 2018-05-03 NOTE — Discharge Instructions (Addendum)

## 2018-05-03 NOTE — ED Notes (Signed)
ED TO INPATIENT HANDOFF REPORT  ED Nurse Name and Phone #: [phone]  S Name/Age/Gender Darrell [name] 63 y.o. male Room/Bed: 028C/028C  Code Status   Code Status: Prior  Home/SNF/Other Home Patient oriented to: self, place, time and situation Is this baseline? Yes   Triage Complete: Triage complete  Chief Complaint chest pain  Triage Note Patient arrived from home via [facility], with reports of central chest pain of 9, non-radiating. Initial SBP was 232. Patient reports that he has not taken his BP medicine since yesterday because he is waiting for his mail-order supply. He received 324ASA and 1 Nitro which brought his pain down to 2  On arrival patient is A&O X4   Allergies No Known Allergies  Level of Care/Admitting Diagnosis ED Disposition    ED Disposition Condition Comment   Admit  Hospital Area: [facility] [[id number]]  Level of Care: Telemetry Cardiac [103]  Diagnosis: ACS (acute coronary syndrome) [facility]) [[id number]]  Admitting Physician: Dolores Patty [[id number]]  Attending Physician: Dolores Patty [[id number]]  Estimated length of stay: 3 - 4 days  Certification:: I certify this patient will need inpatient services for at least 2 midnights  PT Class (Do Not Modify): Inpatient [101]  PT Acc Code (Do Not Modify): Private [1]       B Medical/Surgery History Past Medical History:  Diagnosis Date  . CKD (chronic kidney disease), stage III (HCC)   . Claudication (HCC)   . Coronary artery disease    a. 08/2001 s/p DES to RCA/RI;  b. 07/2006 s/p DES to p/d LCX;  c. 11/2007 CABGx5 (LIMA->LAD, VG->D1, LRA->OM1, VG->PDA->LPL); c. 03/2015: Synergy DES to mid-Cx in the setting of NSTEMI; d. 11/2015 NSTEMI->Med Rx. e. NSTEMI 01/2018 -> med rx.  . Diabetes mellitus type 2 in nonobese (HCC)    a. A1c 7.0 09/2012.  . Diastolic dysfunction    a. 02/2016 Echo: EF 50-55%, gr1DD, Ao sclerosis, dil Ao root (20mm), sev dil LA, triv TR, PASP .  Marland Kitchen Hyperlipidemia    . Hypertension   . Noncompliance   . Right carotid bruit    a. noted 01/2018, will need OP eval when patient complies with follow-up.   Past Surgical History:  Procedure Laterality Date  . CARDIAC CATHETERIZATION N/A 04/06/2015   Procedure: Left Heart Cath and Cors/Grafts Angiography;  Surgeon: Runell Gess, MD;  Location: [facility] INVASIVE CV LAB;  Service: Cardiovascular;  Laterality: N/A;  . CARDIAC CATHETERIZATION N/A 04/06/2015   Procedure: Coronary Stent Intervention;  Surgeon: Runell Gess, MD;  2.5 mm x 12 mm long Synergy DES followed by  2.5 mm x 16 mm long Synergy DES    . CORONARY ARTERY BYPASS GRAFT     2009 LIMA to LAD, SVG to Diag, SVG to PDA and PL, left radial to OM  . LEFT HEART CATH N/A 10/24/2012   Procedure: LEFT HEART CATH;  Surgeon: Lennette Bihari, MD;  Location: [facility] CATH LAB;  Service: Cardiovascular;  Laterality: N/A;     A IV Location/Drains/Wounds Patient Lines/Drains/Airways Status   Active Line/Drains/Airways    Name:   Placement date:   Placement time:   Site:   Days:   Peripheral IV 05/03/18 Right Antecubital   05/03/18    0910    Antecubital   less than 1   Incision Groin   -    -               Intake/Output Last 24 hours No  intake or output data in the 24 hours ending 05/03/18 1733  Labs/Imaging Results for orders placed or performed during the hospital encounter of 05/03/18 (from the past 48 hour(s))  CBC     Status: Abnormal   Collection Time: 05/03/18 10:12 AM  Result Value Ref Range   WBC 5.5 4.0 - 10.5 K/uL   RBC 4.01 (L) 4.22 - 5.81 MIL/uL   Hemoglobin 12.1 (L) 13.0 - 17.0 g/dL   HCT 96.2 (L) 22.9 - 79.8 %   MCV 91.3 80.0 - 100.0 fL   MCH 30.2 26.0 - 34.0 pg   MCHC 33.1 30.0 - 36.0 g/dL   RDW 92.1 19.4 - 17.4 %   Platelets 165 150 - 400 K/uL   nRBC 0.0 0.0 - 0.2 %    Comment: Performed at [id number], [address] [place], [place] [address]  Basic metabolic panel     Status: Abnormal   Collection Time: 05/03/18 10:12 AM   Result Value Ref Range   Sodium 138 135 - 145 mmol/L   Potassium 4.1 3.5 - 5.1 mmol/L   Chloride 106 98 - 111 mmol/L   CO2 24 22 - 32 mmol/L   Glucose, Bld 130 (H) 70 - 99 mg/dL   BUN 25 (H) 8 - 23 mg/dL   Creatinine, Ser 8.18 (H) 0.61 - 1.24 mg/dL   Calcium 8.5 (L) 8.9 - 10.3 mg/dL   GFR calc non Af Amer 51 (L) >60 mL/min   GFR calc Af Amer 59 (L) >60 mL/min   Anion gap 8 5 - 15    Comment: Performed at [facility], [address], [place], [place] [address]  Troponin I - ONCE - STAT     Status: None   Collection Time: 05/03/18 10:12 AM  Result Value Ref Range   Troponin I <0.03 <0.03 ng/mL    Comment: Performed at [facility], [address] [place], [place] [address]  Troponin I - ONCE - STAT     Status: Abnormal   Collection Time: 05/03/18 12:23 PM  Result Value Ref Range   Troponin I 0.29 (HH) <0.03 ng/mL    Comment: CRITICAL RESULT CALLED TO, READ BACK BY AND VERIFIED WITHKeane Police AT 1341 05/03/2018 BY L BENFIELD Performed at [facility], [address] [place], [place] [address]    Dg Chest Port 1 View  Result Date: 05/03/2018 CLINICAL DATA:  Acute onset sharp central chest pain. Hypertension. Coronary artery disease. EXAM: PORTABLE CHEST 1 VIEW COMPARISON:  02/05/2018 FINDINGS: Heart size is within normal limits. Both lungs are clear. Prior CABG again noted. IMPRESSION: No active disease. Electronically Signed   By: Myles Rosenthal M.D.   On: 05/03/2018 11:05    Pending Labs [employer] (From admission, onward)    Start     Ordered   Signed and Held  T4, free  Once,   R     Signed and Held   Signed and Held  Brain natriuretic peptide  Once,   R     Signed and Held   Signed and Armed forces training and education officer morning,   R     Signed and Held   Medical illustrator and Held  CBC  Tomorrow morning,   R     Signed and Held   Signed and Held  Protime-INR  Tomorrow morning,   R     Signed and Held  Vitals/Pain Today's Vitals   05/03/18  1600 05/03/18 1615 05/03/18 1630 05/03/18 1700  BP: (!) 169/98  (!) 168/105 (!) 146/91  Pulse: 74 76 79 76  Resp: Temp:      TempSrc:      SpO2: 98% 96% 97% 97%  Weight:      Height:      PainSc:        Isolation Precautions No active isolations  Medications Medications  carvedilol (COREG) tablet 25 mg (25 mg Oral Given 05/03/18 1638)  amLODipine (NORVASC) tablet 10 mg (10 mg Oral Given 05/03/18 1015)  isosorbide mononitrate (IMDUR) 24 hr tablet 120 mg (120 mg Oral Given 05/03/18 1015)  losartan (COZAAR) tablet 100 mg (100 mg Oral Given 05/03/18 1015)  metoprolol tartrate (LOPRESSOR) injection 5 mg (5 mg Intravenous Given 05/03/18 1638)    Mobility walks Low fall risk   Focused Assessments Cardiac Assessment Handoff:  Cardiac Rhythm: Normal sinus rhythm Lab Results  Component Value Date   CKTOTAL 30 12/06/2007   CKMB 1.2 12/06/2007   TROPONINI 0.29 (HH) 05/03/2018   Lab Results  Component Value Date   DDIMER (H) 12/01/2007    4.12        AT THE INHOUSE ESTABLISHED CUTOFF VALUE OF 0.48 ug/mL FEU, THIS ASSAY HAS BEEN DOCUMENTED IN THE LITERATURE TO HAVE   Does the Patient currently have chest pain? No     R Recommendations: See Admitting Provider Note  Report given to:   Additional Notes: .

## 2018-05-03 NOTE — ED Provider Notes (Addendum)
[place] EMERGENCY DEPARTMENT Provider Note   CSN: [id number] Arrival date & time: 05/03/18  4166    History   Chief Complaint No chief complaint on file.   HPI Darrell [name] is a 63 y.o. male.     HPI  63 year old man history of chronic kidney disease, coronary artery disease, diabetes, hypertension who has been out of his blood pressure medicine for 2 days.  He states that his office did not want him to come in to get it refilled and they were having it sent to him.  He states he should be getting it tomorrow or the next day.  He began having sharp anterior chest pain that began this morning while watching TV at approximately 7 AM.  It was nonradiating in nature and had no associated symptoms.  He identifies this pain is similar to previous pain he has had when his blood pressure has been high.  He has had an MI and bypass surgery in the past.  He was given aspirin and nitroglycerin by EMS and pain has resolved.  Review of his records reveal that he is on Imdur, amlodipine, and carvedilol and losartan for his high blood pressure.  He has not taken these medications today.  Denies any exposure to anyone sick or with covered.  He has not been out of the state or country.  Past Medical History:  Diagnosis Date   CKD (chronic kidney disease), stage III ([place])    Claudication ([place])    Coronary artery disease    a. 08/2001 s/p DES to RCA/RI;  b. 07/2006 s/p DES to p/d LCX;  c. 11/2007 CABGx5 (LIMA->LAD, VG->D1, LRA->OM1, VG->PDA->LPL); c. 03/2015: Synergy DES to mid-Cx in the setting of NSTEMI; d. 11/2015 NSTEMI->Med Rx. e. NSTEMI 01/2018 -> med rx.   Diabetes mellitus type 2 in nonobese [facility])    a. A1c 7.0 09/2012.   Diastolic dysfunction    a. 02/2016 Echo: EF 50-55%, gr1DD, Ao sclerosis, dil Ao root (25m), sev dil LA, triv TR, PASP 238mg.   Hyperlipidemia    Hypertension    Noncompliance    Right carotid bruit    a. noted 01/2018, will need OP eval when  patient complies with follow-up.    Patient Active Problem List   Diagnosis Date Noted   Mild anemia 02/08/2018   CKD (chronic kidney disease), stage III (HCC)    [facility]ob[facility]   Hyperlipidemia    Hypertension    Right carotid bruit    Sepsis ([place]02/03/2016   Chest pain    Hyponatremia 01/03/2016   Hypertensive emergency    NSTEMI (non-ST elevated myocardial infarction) ([facility]03/07/2015   HTN (hypertension) 04/04/2015   Hypertensive urgency 03/25/2014   History of noncompliance with medical treatment, presenting hazards to health 03/25/2014   Claudication ([facility]10/01/2012   GERD 10/23/2007   NEPHROLITHIASIS 10/23/2007   HYPERCHOLESTEROLEMIA 10/22/2007   ADJUSTMENT DISORDER WITH DEPRESSED MOOD 10/22/2007   Type 2 diabetes mellitus without complication, without long-term current use of insulin ([place]/01/2003   Coronary atherosclerosis, s/p CABG in 2009  01/28/2001    Past Surgical History:  Procedure Laterality Date   CARDIAC CATHETERIZATION N/A 04/06/2015   Procedure: Left Heart Cath and Cors/Grafts Angiography;  Surgeon: JoLorretta HarpMD;  [facility];  Service: Cardiovascular;  Laterality: N/A;   CARDIAC CATHETERIZATION N/A 04/06/2015   Procedure: Coronary Stent Intervention;  Surgeon: JoLorretta HarpMD;  2.5  mm x 12 mm long Synergy DES followed by  2.5 mm x 16 mm long Synergy DES     CORONARY ARTERY BYPASS GRAFT     2009 LIMA to LAD, SVG to Diag, SVG to PDA and PL, left radial to OM   LEFT HEART CATH N/A 10/24/2012   Procedure: LEFT HEART CATH;  Surgeon: Troy Sine, MD;  Location: [facility] CATH LAB;  Service: Cardiovascular;  Laterality: N/A;        Home Medications    Prior to Admission medications   Medication Sig Start Date End Date Taking? Authorizing Provider  amLODipine (NORVASC) 10 MG tablet Take 1 tablet (10 mg total) by mouth daily. 03/12/18   [name], MD  aspirin 81 MG EC  tablet Take 1 tablet (81 mg total) by mouth daily. 03/12/18   [name], MD  atorvastatin (LIPITOR) 80 MG tablet Take 1 tablet (80 mg total) by mouth every evening. 03/12/18   [name], MD  Blood Glucose Monitoring Suppl (TRUE METRIX METER) w/Device KIT USE AS INSTRUCTED 07/05/16   [name], MD  carvedilol (COREG) 25 MG tablet Take 1 tablet (25 mg total) by mouth 2 (two) times daily with a meal. 03/12/18   [name], MD  clopidogrel (PLAVIX) 75 MG tablet Take 1 tablet (75 mg total) by mouth daily. 03/12/18   [name], MD  glucose blood (TRUE METRIX BLOOD GLUCOSE TEST) test strip 1 each by Other route 3 (three) times daily. 03/12/18   [name], MD  isosorbide mononitrate (IMDUR) 120 MG 24 hr tablet Take 1 tablet (120 mg total) by mouth daily. 03/12/18   [name], MD  Lancet Devices [facility]) lancets Use as instructed daily. 07/05/16   [name], MD  losartan (COZAAR) 100 MG tablet Take 1 tablet (100 mg total) by mouth daily. 03/12/18   [name], MD  metFORMIN (GLUCOPHAGE) 500 MG tablet Take 1 tablet (500 mg total) by mouth 2 (two) times daily with a meal. 03/12/18   [name], MD  nitroGLYCERIN (NITROSTAT) 0.4 MG SL tablet Place 1 tablet (0.4 mg total) under the tongue every 5 (five) minutes as needed for chest pain (up to 3 doses. If taking 3rd dose call 911). 03/12/18   [name], MD  TRUEPLUS LANCETS 28G MISC USE AS INSTRUCTED 07/05/16   [name], MD    Family History Family History  Problem Relation Age of Onset   Heart failure Mother    Cancer Mother    CAD Mother 54   Heart failure Father    Kidney failure Brother     Social History Social History   Tobacco Use   Smoking status: Former Smoker    Last attempt to quit: 10/25/2010    Years since quitting: 7.5   Smokeless tobacco: Never Used  Substance Use Topics   Alcohol use: No    Comment: sts he drank for the first time in a long time last night      Drug use: No     Allergies   Patient has no known allergies.   Review of Systems Review of Systems  All other systems reviewed and are negative.    Physical Exam Updated Vital Signs BP (!) 190/130    Pulse 92    Temp 98.3 F (36.8 C) (Oral)    Resp 14    SpO2 98%   Physical Exam Vitals signs and nursing note reviewed.  Constitutional:      General: He is not  in acute distress.    Appearance: Normal appearance. He is normal weight.  HENT:     Head: Normocephalic.     Right Ear: External ear normal.     Left Ear: External ear normal.     Nose: Nose normal.     Mouth/Throat:     Mouth: Mucous membranes are moist.  Neck:     Musculoskeletal: Normal range of motion.  Cardiovascular:     Rate and Rhythm: Normal rate and regular rhythm.  Pulmonary:     Effort: Pulmonary effort is normal.     Breath sounds: Normal breath sounds.  Abdominal:     General: Abdomen is flat.  Musculoskeletal: Normal range of motion.        General: Swelling present. No tenderness, deformity or signs of injury.     Right lower leg: Edema present.     Left lower leg: Edema present.     Comments: Right lower extremity with some mild erythema Trace bilateral edema  Skin:    General: Skin is warm and dry.     Capillary Refill: Capillary refill takes less than 2 seconds.  Neurological:     General: No focal deficit present.     Mental Status: He is alert and oriented to person, place, and time.  Psychiatric:        Mood and Affect: Mood normal.      ED Treatments / Results  Labs (all labs ordered are listed, but only abnormal results are displayed) Labs Reviewed - No data to display  EKG EKG Interpretation  Date/Time:  'Sunday May 03 2018 09:52:46 EDT Ventricular Rate:  82 PR Interval:    QRS Duration: 116 QT Interval:  418 QTC Calculation: 489 R Axis:   71 Text Interpretation:  Sinus rhythm Nonspecific intraventricular conduction delay Anteroseptal infarct, old st elevation  leads v1-v3 has resolved since first prior ekg Confirmed by Gryphon Vanderveen ([id number]) on 05/03/2018 10:43:21 AM   Radiology No results found.  Procedures Procedures (including critical care time)  Medications Ordered in ED Medications - No data to display   Initial Impression / Assessment and Plan / ED Course  I have reviewed the triage vital signs and the nursing notes.  Pertinent labs & imaging results that were available during my care of the patient were reviewed by me and considered in my medical decision making (see chart for details).        62'$  year old man history of hypertension has been out of his blood pressure medications.  He had atypical associated chest pain.  However, he has had an end STEMI with a similar presentation with his blood pressure being high and out of his medications before.  Today's EKG appears to have no acutely ischemic changes.  His initial troponin is negative.  He has been pain-free since he has been here.  He has received his blood pressure medications.  I discussed his care with Dr. Harrell Gave, on-call for cardiology.  Plan repeat troponin at 1:00 which is 6 hours from onset of pain.  If this is negative, plan discharged home.  I am checking with the patient to make sure that he has his medications delivered tomorrow and if he does not he knows to call the cardiology office and they will call them in for him.  Additionally, his Cozaar is due at 5 PM and I am sending this with him. REpeat tropnonin elevated. Discussed with Dr. Harrell Gave and cardiology will see.  Final Clinical Impressions(s) / ED Diagnoses  Final diagnoses:  NSTEMI (non-ST elevated myocardial infarction) [facility])  Hypertensive emergency  Has run out of medications    ED Discharge Orders    None       Pattricia Boss, MD 05/03/18 1404    Pattricia Boss, MD 05/03/18 [phone]

## 2018-05-04 ENCOUNTER — Encounter (HOSPITAL_COMMUNITY)
Admission: EM | Disposition: A | Discharge status: Home / Self Care | Payer: Self-pay | Source: Home / Self Care | Attending: Internal Medicine

## 2018-05-04 ENCOUNTER — Other Ambulatory Visit: Payer: Self-pay

## 2018-05-04 DIAGNOSIS — I2510 Atherosclerotic heart disease of native coronary artery without angina pectoris: Secondary | ICD-10-CM

## 2018-05-04 DIAGNOSIS — I249 Acute ischemic heart disease, unspecified: Secondary | ICD-10-CM

## 2018-05-04 DIAGNOSIS — I10 Essential (primary) hypertension: Secondary | ICD-10-CM

## 2018-05-04 DIAGNOSIS — E785 Hyperlipidemia, unspecified: Secondary | ICD-10-CM

## 2018-05-04 HISTORY — PX: LEFT HEART CATH AND CORS/GRAFTS ANGIOGRAPHY: CATH118250

## 2018-05-04 HISTORY — PX: CORONARY BALLOON ANGIOPLASTY: CATH118233

## 2018-05-04 LAB — HEPARIN LEVEL (UNFRACTIONATED)
Heparin Unfractionated: 0.37 IU/mL (ref 0.30–0.70)
Heparin Unfractionated: 0.4 IU/mL (ref 0.30–0.70)

## 2018-05-04 LAB — POCT ACTIVATED CLOTTING TIME
Activated Clotting Time: 340 seconds
Activated Clotting Time: 175 seconds
Activated Clotting Time: 164 seconds
Activated Clotting Time: 230 seconds

## 2018-05-04 LAB — CBC
HCT: 33.6 % — ABNORMAL LOW (ref 39.0–52.0)
Hemoglobin: 11.1 g/dL — ABNORMAL LOW (ref 13.0–17.0)
MCH: 29.6 pg (ref 26.0–34.0)
MCHC: 33 g/dL (ref 30.0–36.0)
MCV: 89.6 fL (ref 80.0–100.0)
Platelets: 175 10*3/uL (ref 150–400)
RBC: 3.75 MIL/uL — ABNORMAL LOW (ref 4.22–5.81)
RDW: 15 % (ref 11.5–15.5)
WBC: 6 10*3/uL (ref 4.0–10.5)
nRBC: 0 % (ref 0.0–0.2)

## 2018-05-04 LAB — BASIC METABOLIC PANEL
Anion gap: 6 (ref 5–15)
BUN: 27 mg/dL — ABNORMAL HIGH (ref 8–23)
CO2: 23 mmol/L (ref 22–32)
Calcium: 8.5 mg/dL — ABNORMAL LOW (ref 8.9–10.3)
Chloride: 108 mmol/L (ref 98–111)
Creatinine, Ser: 1.36 mg/dL — ABNORMAL HIGH (ref 0.61–1.24)
GFR calc Af Amer: >60 mL/min (ref >60)
GFR calc non Af Amer: 56 mL/min — ABNORMAL LOW (ref >60)
Glucose, Bld: 90 mg/dL (ref 70–99)
Potassium: 4.3 mmol/L (ref 3.5–5.1)
Sodium: 137 mmol/L (ref 135–145)

## 2018-05-04 LAB — PROTIME-INR
INR: 1.2 (ref 0.8–1.2)
Prothrombin Time: 14.8 seconds (ref 11.4–15.2)

## 2018-05-04 SURGERY — LEFT HEART CATH AND CORS/GRAFTS ANGIOGRAPHY
Anesthesia: LOCAL

## 2018-05-04 MED ORDER — SODIUM CHLORIDE 0.9% FLUSH: 3.0000 mL | INTRAVENOUS | Status: DC | PRN

## 2018-05-04 MED ORDER — ACETAMINOPHEN 325 MG PO TABS
650.0000 mg | ORAL_TABLET | ORAL | Status: DC | PRN
  Filled 2018-05-04: qty 2

## 2018-05-04 MED ORDER — HYDRALAZINE HCL 20 MG/ML IJ SOLN
INTRAMUSCULAR | Status: AC
  Filled 2018-05-04: qty 1

## 2018-05-04 MED ORDER — SODIUM CHLORIDE 0.9% FLUSH: 3.0000 mL | Freq: Two times a day (BID) | INTRAVENOUS | Status: DC

## 2018-05-04 MED ORDER — NITROGLYCERIN IN D5W 200-5 MCG/ML-% IV SOLN
INTRAVENOUS | Status: DC | PRN
  Administered 2018-05-04: 10 ug/min via INTRAVENOUS

## 2018-05-04 MED ORDER — ONDANSETRON HCL 4 MG/2ML IJ SOLN: 4.0000 mg | Freq: Four times a day (QID) | INTRAMUSCULAR | Status: DC | PRN

## 2018-05-04 MED ORDER — MORPHINE SULFATE (PF) 2 MG/ML IV SOLN
1.0000 mg | INTRAVENOUS | Status: DC | PRN
  Administered 2018-05-04: 4 mg via INTRAVENOUS
  Filled 2018-05-04: qty 2

## 2018-05-04 MED ORDER — CLOPIDOGREL BISULFATE 300 MG PO TABS
ORAL_TABLET | ORAL | Status: AC
  Filled 2018-05-04: qty 1

## 2018-05-04 MED ORDER — CLOPIDOGREL BISULFATE 75 MG PO TABS
75.0000 mg | ORAL_TABLET | Freq: Every day | ORAL | Status: DC
  Administered 2018-05-05 – 2018-05-06 (×2): 75 mg via ORAL
  Filled 2018-05-04 (×2): qty 1

## 2018-05-04 MED ORDER — FENTANYL CITRATE (PF) 100 MCG/2ML IJ SOLN
INTRAMUSCULAR | Status: AC
  Filled 2018-05-04: qty 2

## 2018-05-04 MED ORDER — ASPIRIN 81 MG PO CHEW
81.0000 mg | CHEWABLE_TABLET | ORAL | Status: AC
  Administered 2018-05-04: 81 mg via ORAL
  Filled 2018-05-04: qty 1

## 2018-05-04 MED ORDER — HYDRALAZINE HCL 20 MG/ML IJ SOLN
INTRAMUSCULAR | Status: DC | PRN
  Administered 2018-05-04: 10 mg via INTRAVENOUS

## 2018-05-04 MED ORDER — LIDOCAINE HCL (PF) 1 % IJ SOLN
INTRAMUSCULAR | Status: DC | PRN
  Administered 2018-05-04: 30 mL via INTRADERMAL

## 2018-05-04 MED ORDER — ASPIRIN 81 MG PO CHEW
81.0000 mg | CHEWABLE_TABLET | Freq: Every day | ORAL | Status: DC
  Administered 2018-05-05 – 2018-05-06 (×2): 81 mg via ORAL
  Filled 2018-05-04 (×3): qty 1

## 2018-05-04 MED ORDER — FENTANYL CITRATE (PF) 100 MCG/2ML IJ SOLN
INTRAMUSCULAR | Status: DC | PRN
  Administered 2018-05-04: 25 ug via INTRAVENOUS

## 2018-05-04 MED ORDER — HEPARIN (PORCINE) IN NACL 1000-0.9 UT/500ML-% IV SOLN
INTRAVENOUS | Status: AC
  Filled 2018-05-04: qty 1500

## 2018-05-04 MED ORDER — SODIUM CHLORIDE 0.9 % WEIGHT BASED INFUSION: 1.0000 mL/kg/h | INTRAVENOUS | Status: DC

## 2018-05-04 MED ORDER — SODIUM CHLORIDE 0.9 % IV SOLN: 250.0000 mL | INTRAVENOUS | Status: DC | PRN

## 2018-05-04 MED ORDER — HEPARIN (PORCINE) IN NACL 1000-0.9 UT/500ML-% IV SOLN
INTRAVENOUS | Status: DC | PRN
  Administered 2018-05-04 (×3): 500 mL

## 2018-05-04 MED ORDER — MORPHINE SULFATE (PF) 10 MG/ML IV SOLN: 2.0000 mg | INTRAVENOUS | Status: DC | PRN

## 2018-05-04 MED ORDER — BIVALIRUDIN BOLUS VIA INFUSION - CUPID
INTRAVENOUS | Status: DC | PRN
  Administered 2018-05-04: 11:00:00 73.275 mg via INTRAVENOUS

## 2018-05-04 MED ORDER — SODIUM CHLORIDE 0.9% FLUSH
3.0000 mL | Freq: Two times a day (BID) | INTRAVENOUS | Status: DC
  Administered 2018-05-05: 10 mL via INTRAVENOUS

## 2018-05-04 MED ORDER — SODIUM CHLORIDE 0.9 % IV SOLN
INTRAVENOUS | Status: AC
  Administered 2018-05-04: 13:00:00 via INTRAVENOUS

## 2018-05-04 MED ORDER — SODIUM CHLORIDE 0.9 % IV SOLN
INTRAVENOUS | Status: DC | PRN
  Administered 2018-05-04: 11:00:00 1.75 mg/kg/h via INTRAVENOUS

## 2018-05-04 MED ORDER — ATROPINE SULFATE 1 MG/10ML IJ SOSY
PREFILLED_SYRINGE | INTRAMUSCULAR | Status: AC
  Filled 2018-05-04: qty 10

## 2018-05-04 MED ORDER — CLOPIDOGREL BISULFATE 300 MG PO TABS
ORAL_TABLET | ORAL | Status: DC | PRN
  Administered 2018-05-04: 300 mg via ORAL

## 2018-05-04 MED ORDER — SODIUM CHLORIDE 0.9 % WEIGHT BASED INFUSION
3.0000 mL/kg/h | INTRAVENOUS | Status: DC
  Administered 2018-05-04: 3 mL/kg/h via INTRAVENOUS

## 2018-05-04 MED ORDER — NITROGLYCERIN IN D5W 200-5 MCG/ML-% IV SOLN
0.0000 ug/min | INTRAVENOUS | Status: DC
  Administered 2018-05-04: 20 ug/min via INTRAVENOUS

## 2018-05-04 MED ORDER — MORPHINE SULFATE (PF) 2 MG/ML IV SOLN
2.0000 mg | INTRAVENOUS | Status: DC | PRN
  Administered 2018-05-04 (×2): 2 mg via INTRAVENOUS
  Filled 2018-05-04 (×2): qty 1

## 2018-05-04 MED ORDER — BIVALIRUDIN TRIFLUOROACETATE 250 MG IV SOLR
INTRAVENOUS | Status: AC
  Filled 2018-05-04: qty 250

## 2018-05-04 MED ORDER — IOHEXOL 350 MG/ML SOLN
INTRAVENOUS | Status: DC | PRN
  Administered 2018-05-04: 100 mL via INTRA_ARTERIAL

## 2018-05-04 MED ORDER — LABETALOL HCL 5 MG/ML IV SOLN
10.0000 mg | INTRAVENOUS | Status: AC | PRN
  Administered 2018-05-04: 10 mg via INTRAVENOUS
  Filled 2018-05-04: qty 4

## 2018-05-04 MED ORDER — NITROGLYCERIN IN D5W 200-5 MCG/ML-% IV SOLN
INTRAVENOUS | Status: AC
  Filled 2018-05-04: qty 250

## 2018-05-04 MED ORDER — HYDRALAZINE HCL 20 MG/ML IJ SOLN
10.0000 mg | INTRAMUSCULAR | Status: AC | PRN
  Administered 2018-05-04 (×2): 10 mg via INTRAVENOUS
  Filled 2018-05-04 (×2): qty 1

## 2018-05-04 MED ORDER — LIDOCAINE HCL (PF) 1 % IJ SOLN
INTRAMUSCULAR | Status: AC
  Filled 2018-05-04: qty 30

## 2018-05-04 SURGICAL SUPPLY — 18 items
BALLN SAPPHIRE 2.0X12 (BALLOONS) ×2
BALLN WOLVERINE 2.75X10 (BALLOONS) ×2
BALLOON SAPPHIRE 2.0X12 (BALLOONS) ×1 IMPLANT
BALLOON WOLVERINE 2.75X10 (BALLOONS) ×1 IMPLANT
CATH INFINITI 5FR JL5 (CATHETERS) ×2 IMPLANT
CATH INFINITI 5FR MULTPACK ANG (CATHETERS) ×2 IMPLANT
CATH VISTA GUIDE 6FR XB4 (CATHETERS) ×2 IMPLANT
DEVICE CONTINUOUS FLUSH (MISCELLANEOUS) ×2 IMPLANT
KIT ENCORE 26 ADVANTAGE (KITS) ×2 IMPLANT
KIT HEART LEFT (KITS) ×2 IMPLANT
PACK CARDIAC CATHETERIZATION (CUSTOM PROCEDURE TRAY) ×2 IMPLANT
SHEATH PINNACLE 5F 10CM (SHEATH) ×2 IMPLANT
SHEATH PINNACLE 6F 10CM (SHEATH) ×2 IMPLANT
SYR MEDRAD MARK 7 150ML (SYRINGE) ×2 IMPLANT
TRANSDUCER W/STOPCOCK (MISCELLANEOUS) ×2 IMPLANT
TUBING CIL FLEX 10 FLL-RA (TUBING) ×2 IMPLANT
WIRE ASAHI PROWATER 180CM (WIRE) ×2 IMPLANT
WIRE EMERALD 3MM-J .035X150CM (WIRE) ×2 IMPLANT

## 2018-05-04 NOTE — Progress Notes (Signed)
ANTICOAGULATION CONSULT NOTE   Pharmacy Consult for heparin Indication: chest pain/ACS  No Known Allergies  Patient Measurements: Height: 6\' 1"  (185.4 cm) Weight: 215 lb 4.8 oz (97.7 kg) IBW/kg (Calculated) : 79.9 Heparin Dosing Weight: 101.8 kg  Vital Signs: Temp: 98.2 F (36.8 C) (04/06 0323) Temp Source: Oral (04/06 0323) BP: 125/84 (04/06 0323) Pulse Rate: 70 (04/06 0323)  Labs: Recent Labs    05/03/18 1012 05/03/18 1223 05/03/18 2344 05/04/18 0319  HGB 12.1*  --   --  11.1*  HCT 36.6*  --   --  33.6*  PLT 165  --   --  175  LABPROT  --   --   --  14.8  INR  --   --   --  1.2  HEPARINUNFRC  --   --  0.40 0.37  CREATININE 1.46*  --   --  1.36*  TROPONINI <0.03 0.29*  --   --     Estimated Creatinine Clearance: 70.2 mL/min (A) (by C-G formula based on SCr of 1.36 mg/dL (H)).   Medical History: Past Medical History:  Diagnosis Date  . CKD (chronic kidney disease), stage III (HCC)   . Claudication (HCC)   . Coronary artery disease    a. 08/2001 s/p DES to RCA/RI;  b. 07/2006 s/p DES to p/d LCX;  c. 11/2007 CABGx5 (LIMA->LAD, VG->D1, LRA->OM1, VG->PDA->LPL); c. 03/2015: Synergy DES to mid-Cx in the setting of NSTEMI; d. 11/2015 NSTEMI->Med Rx. e. NSTEMI 01/2018 -> med rx.  . Diabetes mellitus type 2 in nonobese (HCC)    a. A1c 7.0 09/2012.  . Diastolic dysfunction    a. 02/2016 Echo: EF 50-55%, gr1DD, Ao sclerosis, dil Ao root (72mm), sev dil LA, triv TR, PASP .  Marland Kitchen Hyperlipidemia   . Hypertension   . Noncompliance   . Right carotid bruit    a. noted 01/2018, will need OP eval when patient complies with follow-up.    Medications:  Scheduled:  . aspirin  81 mg Oral Daily  . atorvastatin  80 mg Oral QPM  . carvedilol  25 mg Oral BID WC  . clopidogrel  75 mg Oral Daily  . sodium chloride flush  3 mL Intravenous Q12H    Assessment: Darrell [name] presenting with chest pain. Has hx of CAD s/p prior CABG and PCI, also hx non-compliance. No anticoag PTA. Plans for  cath today -heparin level= 0.37   Goal of Therapy:  Heparin level 0.3-0.7 units/ml Monitor platelets by anticoagulation protocol: Yes   Plan:  -No heparin changes needed -Will follow plans post cath  Harland German, PharmD Clinical Pharmacist **Pharmacist phone directory can now be found on [email or web address] ([email or web address]).  Listed under [facility].

## 2018-05-04 NOTE — Progress Notes (Signed)
Pt 6 fr sheath pulled by Misha Ferolito RN @ 1540. 21ml of blood aspirated into line. Sheath pulled and pressure applied to site for . Tip of sheath noted to be intact. No distress noted from pt. Pt educated on precautions and splinting of site technique. Site is a level 0. Will continue monitor

## 2018-05-04 NOTE — Progress Notes (Addendum)
Progress Note  Patient Name: Darrell [name] Date of Encounter: 05/04/2018  Primary Cardiologist: Darrell Rotunda, MD   Subjective   No chest pain since admission  Inpatient Medications    Scheduled Meds: . aspirin  81 mg Oral Daily  . atorvastatin  80 mg Oral QPM  . carvedilol  25 mg Oral BID WC  . clopidogrel  75 mg Oral Daily  . sodium chloride flush  3 mL Intravenous Q12H   Continuous Infusions: . sodium chloride    . sodium chloride 1 mL/kg/hr (05/04/18 0909)  . heparin 1,200 Units/hr (05/03/18 1838)   PRN Meds: sodium chloride, acetaminophen, nitroGLYCERIN, ondansetron (ZOFRAN) IV, sodium chloride flush   Vital Signs    Vitals:   05/03/18 1730 05/03/18 1818 05/03/18 2043 05/04/18 0323  BP: (!) 142/88 (!) 148/85 128/76 125/84  Pulse: 63 68 70 70  Resp: 20  18 14   Temp:  98.1 F (36.7 C) 98.3 F (36.8 C) 98.2 F (36.8 C)  TempSrc:  Oral Oral Oral  SpO2: 96% 100% 99% 96%  Weight:    97.7 kg  Height:        Intake/Output Summary (Last 24 hours) at 05/04/2018 0910 Last data filed at 05/04/2018 0300 Gross per 24 hour  Intake 499.58 ml  Output 275 ml  Net 224.58 ml   Last 3 Weights 05/04/2018 05/03/2018 03/12/2018  Weight (lbs) 215 lb 4.8 oz 234 lb 227 lb 9.6 oz  Weight (kg) 97.659 kg 106.142 kg 103.239 kg      Telemetry    Sinus rhythm 69 - Personally Reviewed  ECG    Normal sinus rhythm at 82 bpm.  R wave progression V1 through V3- Personally Reviewed  Physical Exam   GEN: No acute distress.   Neck: No JVD Cardiac: RRR, no murmurs, rubs, or gallops.  Respiratory: Clear to auscultation bilaterally. GI: Soft, nontender, non-distended  MS: No edema; No deformity. Neuro:  Nonfocal  Psych: Normal affect   Labs    Chemistry Recent Labs  Lab 05/03/18 1012 05/04/18 0319  NA 138 137  K 4.1 4.3  CL 106 108  CO2 24 23  GLUCOSE 130* 90  BUN 25* 27*  CREATININE 1.46* 1.36*  CALCIUM 8.5* 8.5*  GFRNONAA 51* 56*  GFRAA 59* >60  ANIONGAP 8 6      Hematology Recent Labs  Lab 05/03/18 1012 05/04/18 0319  WBC 5.5 6.0  RBC 4.01* 3.75*  HGB 12.1* 11.1*  HCT 36.6* 33.6*  MCV 91.3 89.6  MCH 30.2 29.6  MCHC 33.1 33.0  RDW 14.7 15.0  PLT 165 175    Cardiac Enzymes Recent Labs  Lab 05/03/18 1012 05/03/18 1223  TROPONINI <0.03 0.29*   No results for input(s): TROPIPOC in the last 168 hours.   BNP Recent Labs  Lab 05/03/18 1829  BNP 334.6*     DDimer No results for input(s): DDIMER in the last 168 hours.   Radiology    Dg Chest Port 1 View  Result Date: 05/03/2018 CLINICAL DATA:  Acute onset sharp central chest pain. Hypertension. Coronary artery disease. EXAM: PORTABLE CHEST 1 VIEW COMPARISON:  02/05/2018 FINDINGS: Heart size is within normal limits. Both lungs are clear. Prior CABG again noted. IMPRESSION: No active disease. Electronically Signed   By: Darrell [name] M.D.   On: 05/03/2018 11:05    Cardiac Studies   ------------------------------------------------------------------- ECHO 2018 Study Conclusions  - Left ventricle: The cavity size was normal. There was moderate   concentric hypertrophy. Systolic  function was normal. The   estimated ejection fraction was in the range of 50% to 55%.   Inferior hypokinesis. Doppler parameters are consistent with   abnormal left ventricular relaxation (grade 1 diastolic   dysfunction). The E/e&' ratio is between 8-15, suggesting   indeterminate LV filling pressure. - Aortic valve: Trileaflet. Sclerosis without stenosis. There was   no regurgitation. - Aorta: Aortic root dimension: 42 mm (ED). - Aortic root: The aortic root is dilated. - Mitral valve: Mildly thickened leaflets . There was trivial   regurgitation. - Left atrium: Severely dilated. - Right ventricle: The cavity size was mildly dilated. Systolic   function was normal. - Right atrium: The atrium was mildly dilated. - Tricuspid valve: There was trivial regurgitation. - Pulmonary arteries: PA peak  pressure: 22 mm Hg (S). - Inferior vena cava: The vessel was normal in size. The   respirophasic diameter changes were in the normal range (= 50%),   consistent with normal central venous pressure.  Impressions:  - Compared to a prior study in 03/2015, there are no significant   changes.   Patient Profile     63 y.o. male with hx of h/o CAD s/p prior CABG and multiple interventions, HTN, HL, DM, and long h/o noncompliance, mainly due to inability to afford meds. Presented with chest pain and found to have elevated troponins.  Assessment & Plan    1. Chest pain/NSTEMI: troponin up to 0.29. Felt this may have been related to hypertension alone but with NSTEMI in 01/2018 and refusal of cath concern would be further CAD. During that admission he was discharged on plavix and reported to have been taking. -- remains on IV heparin, and nitro -- planned for cardiac cath today  2. CAD with hx of CABG: On ASA and plavix prior to admission. Cath 2017 with occluded SVG-RCA with PCI to the pLCx.    3. CKD 3: Cr 1.46>>1.36, currently receiving pre-hydration IVFs  4. HLD: on high dose statin PTA though issues with compliance 2/2 to cost of meds -- lipids in am  5. HTN: blood pressure significantly improved on BB. Further recommendations pending cath and renal function.  For questions or updates, please contact [facility] Please consult [email or web address] for contact info under        Signed, Darrell Page, NP  05/04/2018, 9:10 AM     Patient seen and examined. Agree with assessment and plan.  Mr Darrell [name] is status post prior CABG revascularization surgery in November 2009 by Dr. Dorris [name] and reportedly has a LIMA graft to his LAD, left radial graft to his marginal, and  graft sequentially to the PDA and PLA and ? reportedly a graft to a diagonal.  At cath in 2014 by me the  vein graft to the RCA was occluded at its origin.   Only 2 markers were present in the aorta.  His LIMA to LAD was  widely patent and his graft was patent.  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  The distal circumflex stent was not intervened upon.  He has had recurrent chest pain symptomatology.  Plan repeat cardiac catheterization today which will need to be done from the femoral route since the patient has had used his right radial artery for bypass. The risks and benefits of a cardiac catheterization including, but not limited to, death, stroke, MI, kidney damage and bleeding were discussed with the patient  who indicates understanding and agrees to proceed.    Thomas A. Kelly, MD, FACC 05/04/2018 10:16 AM   

## 2018-05-04 NOTE — Interval H&P Note (Signed)
Cath Lab Visit (complete for each Cath Lab visit)  Clinical Evaluation Leading to the Procedure:   ACS: Yes.    Non-ACS:    Anginal Classification: CCS III  Anti-ischemic medical therapy: Minimal Therapy (1 class of medications)  Non-Invasive Test Results: No non-invasive testing performed  Prior CABG: Previous CABG      History and Physical Interval Note:  05/04/2018 10:44 AM  Darrell [name]  has presented today for surgery, with the diagnosis of NSTEMI.  The various methods of treatment have been discussed with the patient and family. After consideration of risks, benefits and other options for treatment, the patient has consented to  Procedure(s): LEFT HEART CATH AND CORS/GRAFTS ANGIOGRAPHY (N/A) as a surgical intervention.  The patient's history has been reviewed, patient examined, no change in status, stable for surgery.  I have reviewed the patient's chart and labs.  Questions were answered to the patient's satisfaction.     Nanetta Batty

## 2018-05-04 NOTE — H&P (View-Only) (Signed)
Progress Note  Patient Name: Darrell [name] Date of Encounter: 05/04/2018  Primary Cardiologist: [name], MD   Subjective   No chest pain since admission  Inpatient Medications    Scheduled Meds: . aspirin  81 mg Oral Daily  . atorvastatin  80 mg Oral QPM  . carvedilol  25 mg Oral BID WC  . clopidogrel  75 mg Oral Daily  . sodium chloride flush  3 mL Intravenous Q12H   Continuous Infusions: . sodium chloride    . sodium chloride 1 mL/kg/hr (05/04/18 0909)  . heparin 1,200 Units/hr (05/03/18 1838)   PRN Meds: sodium chloride, acetaminophen, nitroGLYCERIN, ondansetron (ZOFRAN) IV, sodium chloride flush   Vital Signs    Vitals:   05/03/18 1730 05/03/18 1818 05/03/18 2043 05/04/18 0323  BP: (!) 142/88 (!) 148/85 128/76 125/84  Pulse: 63 68 70 70  Resp: 20  18 14   Temp:  98.1 F (36.7 C) 98.3 F (36.8 C) 98.2 F (36.8 C)  TempSrc:  Oral Oral Oral  SpO2: 96% 100% 99% 96%  Weight:    97.7 kg  Height:        Intake/Output Summary (Last 24 hours) at 05/04/2018 0910 Last data filed at 05/04/2018 0300 Gross per 24 hour  Intake 499.58 ml  Output 275 ml  Net 224.58 ml   Last 3 Weights 05/04/2018 05/03/2018 03/12/2018  Weight (lbs) 215 lb 4.8 oz 234 lb 227 lb 9.6 oz  Weight (kg) 97.659 kg 106.142 kg 103.239 kg      Telemetry    Sinus rhythm 69 - Personally Reviewed  ECG    Normal sinus rhythm at 82 bpm.  R wave progression V1 through V3- Personally Reviewed  Physical Exam   GEN: No acute distress.   Neck: No JVD Cardiac: RRR, no murmurs, rubs, or gallops.  Respiratory: Clear to auscultation bilaterally. GI: Soft, nontender, non-distended  MS: No edema; No deformity. Neuro:  Nonfocal  Psych: Normal affect   Labs    Chemistry Recent Labs  Lab 05/03/18 1012 05/04/18 0319  NA 138 137  K 4.1 4.3  CL 106 108  CO2 24 23  GLUCOSE 130* 90  BUN 25* 27*  CREATININE 1.46* 1.36*  CALCIUM 8.5* 8.5*  GFRNONAA 51* 56*  GFRAA 59* >60  ANIONGAP 8 6      Hematology Recent Labs  Lab 05/03/18 1012 05/04/18 0319  WBC 5.5 6.0  RBC 4.01* 3.75*  HGB 12.1* 11.1*  HCT 36.6* 33.6*  MCV 91.3 89.6  MCH 30.2 29.6  MCHC 33.1 33.0  RDW 14.7 15.0  PLT 165 175    Cardiac Enzymes Recent Labs  Lab 05/03/18 1012 05/03/18 1223  TROPONINI <0.03 0.29*   No results for input(s): TROPIPOC in the last 168 hours.   BNP Recent Labs  Lab 05/03/18 1829  BNP 334.6*     DDimer No results for input(s): DDIMER in the last 168 hours.   Radiology    Dg Chest Port 1 View  Result Date: 05/03/2018 CLINICAL DATA:  Acute onset sharp central chest pain. Hypertension. Coronary artery disease. EXAM: PORTABLE CHEST 1 VIEW COMPARISON:  02/05/2018 FINDINGS: Heart size is within normal limits. Both lungs are clear. Prior CABG again noted. IMPRESSION: No active disease. Electronically Signed   By: Myles Rosenthal M.D.   On: 05/03/2018 11:05    Cardiac Studies   ------------------------------------------------------------------- ECHO 2018 Study Conclusions  - Left ventricle: The cavity size was normal. There was moderate   concentric hypertrophy. Systolic  function was normal. The   estimated ejection fraction was in the range of 50% to 55%.   Inferior hypokinesis. Doppler parameters are consistent with   abnormal left ventricular relaxation (grade 1 diastolic   dysfunction). The E/e&' ratio is between 8-15, suggesting   indeterminate LV filling pressure. - Aortic valve: Trileaflet. Sclerosis without stenosis. There was   no regurgitation. - Aorta: Aortic root dimension: 42 mm (ED). - Aortic root: The aortic root is dilated. - Mitral valve: Mildly thickened leaflets . There was trivial   regurgitation. - Left atrium: Severely dilated. - Right ventricle: The cavity size was mildly dilated. Systolic   function was normal. - Right atrium: The atrium was mildly dilated. - Tricuspid valve: There was trivial regurgitation. - Pulmonary arteries: PA peak  pressure: 22 mm Hg (S). - Inferior vena cava: The vessel was normal in size. The   respirophasic diameter changes were in the normal range (= 50%),   consistent with normal central venous pressure.  Impressions:  - Compared to a prior study in 03/2015, there are no significant   changes.   Patient Profile     63 y.o. male with hx of h/o CAD s/p prior CABG and multiple interventions, HTN, HL, DM, and long h/o noncompliance, mainly due to inability to afford meds. Presented with chest pain and found to have elevated troponins.  Assessment & Plan    1. Chest pain/NSTEMI: troponin up to 0.29. Felt this may have been related to hypertension alone but with NSTEMI in 01/2018 and refusal of cath concern would be further CAD. During that admission he was discharged on plavix and reported to have been taking. -- remains on IV heparin, and nitro -- planned for cardiac cath today  2. CAD with hx of CABG: On ASA and plavix prior to admission. Cath 2017 with occluded SVG-RCA with PCI to the pLCx.    3. CKD 3: Cr 1.46>>1.36, currently receiving pre-hydration IVFs  4. HLD: on high dose statin PTA though issues with compliance 2/2 to cost of meds -- lipids in am  5. HTN: blood pressure significantly improved on BB. Further recommendations pending cath and renal function.  For questions or updates, please contact [facility] Please consult [email or web address] for contact info under        Signed, Laverda Page, NP  05/04/2018, 9:10 AM     Patient seen and examined. Agree with assessment and plan.  Mr Darrell [name] is status post prior CABG revascularization surgery in November 2009 by Dr. Dorris Fetch and reportedly has a LIMA graft to his LAD, left radial graft to his marginal, and  graft sequentially to the PDA and PLA and ? reportedly a graft to a diagonal.  At cath in 2014 by me the  vein graft to the RCA was occluded at its origin.   Only 2 markers were present in the aorta.  His LIMA to LAD was  widely patent and his graft was patent.  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  The distal circumflex stent was not intervened upon.  He has had recurrent chest pain symptomatology.  Plan repeat cardiac catheterization today which will need to be done from the femoral route since the patient has had used his right radial artery for bypass. The risks and benefits of a cardiac catheterization including, but not limited to, death, stroke, MI, kidney damage and bleeding were discussed with the patient  who indicates understanding and agrees to proceed.    Lennette Bihari, MD, [facility] 05/04/2018 10:16 AM

## 2018-05-04 NOTE — Progress Notes (Signed)
ANTICOAGULATION CONSULT NOTE - Follow Up Consult  Pharmacy Consult for heparin Indication: chest pain/ACS  Labs: Recent Labs    05/03/18 1012 05/03/18 1223 05/03/18 2344  HGB 12.1*  --   --   HCT 36.6*  --   --   PLT 165  --   --   HEPARINUNFRC  --   --  0.40  CREATININE 1.46*  --   --   TROPONINI <0.03 0.29*  --     Assessment/Plan:  63yo male therapeutic on heparin with initial dosing for CP. Will continue gtt at current rate and confirm stable with am labs.   Vernard Gambles, PharmD, BCPS  05/04/2018,12:47 AM

## 2018-05-05 ENCOUNTER — Encounter (HOSPITAL_COMMUNITY): Payer: Self-pay | Admitting: Cardiovascular Disease

## 2018-05-05 DIAGNOSIS — E782 Mixed hyperlipidemia: Secondary | ICD-10-CM

## 2018-05-05 DIAGNOSIS — N183 Chronic kidney disease, stage 3 (moderate): Secondary | ICD-10-CM

## 2018-05-05 LAB — GLUCOSE, CAPILLARY
Glucose-Capillary: 147 mg/dL — ABNORMAL HIGH (ref 70–99)
Glucose-Capillary: 105 mg/dL — ABNORMAL HIGH (ref 70–99)
Glucose-Capillary: 91 mg/dL (ref 70–99)
Glucose-Capillary: 127 mg/dL — ABNORMAL HIGH (ref 70–99)

## 2018-05-05 LAB — HEPATIC FUNCTION PANEL
ALT: 10 U/L (ref 0–44)
AST: 19 U/L (ref 15–41)
Albumin: 2.9 g/dL — ABNORMAL LOW (ref 3.5–5.0)
Alkaline Phosphatase: 68 U/L (ref 38–126)
Bilirubin, Direct: 0.1 mg/dL (ref 0.0–0.2)
Indirect Bilirubin: 0.6 mg/dL (ref 0.3–0.9)
Total Bilirubin: 0.7 mg/dL (ref 0.3–1.2)
Total Protein: 6 g/dL — ABNORMAL LOW (ref 6.5–8.1)

## 2018-05-05 LAB — BASIC METABOLIC PANEL
Anion gap: 9 (ref 5–15)
BUN: 25 mg/dL — ABNORMAL HIGH (ref 8–23)
CO2: 21 mmol/L — ABNORMAL LOW (ref 22–32)
Calcium: 8.3 mg/dL — ABNORMAL LOW (ref 8.9–10.3)
Chloride: 106 mmol/L (ref 98–111)
Creatinine, Ser: 1.64 mg/dL — ABNORMAL HIGH (ref 0.61–1.24)
GFR calc Af Amer: 52 mL/min — ABNORMAL LOW (ref >60)
GFR calc non Af Amer: 44 mL/min — ABNORMAL LOW (ref >60)
Glucose, Bld: 135 mg/dL — ABNORMAL HIGH (ref 70–99)
Potassium: 4.1 mmol/L (ref 3.5–5.1)
Sodium: 136 mmol/L (ref 135–145)

## 2018-05-05 LAB — CBC
HCT: 35.8 % — ABNORMAL LOW (ref 39.0–52.0)
Hemoglobin: 11.4 g/dL — ABNORMAL LOW (ref 13.0–17.0)
MCH: 28.9 pg (ref 26.0–34.0)
MCHC: 31.8 g/dL (ref 30.0–36.0)
MCV: 90.6 fL (ref 80.0–100.0)
Platelets: 166 10*3/uL (ref 150–400)
RBC: 3.95 MIL/uL — ABNORMAL LOW (ref 4.22–5.81)
RDW: 15.2 % (ref 11.5–15.5)
WBC: 5 10*3/uL (ref 4.0–10.5)
nRBC: 0 % (ref 0.0–0.2)

## 2018-05-05 LAB — PLATELET INHIBITION P2Y12: Platelet Function  P2Y12: 159 [PRU] — ABNORMAL LOW (ref 182–335)

## 2018-05-05 MED ORDER — INSULIN ASPART 100 UNIT/ML ~~LOC~~ SOLN
0.0000 [IU] | Freq: Three times a day (TID) | SUBCUTANEOUS | Status: DC
  Administered 2018-05-06: 3 [IU] via SUBCUTANEOUS

## 2018-05-05 MED ORDER — AMLODIPINE BESYLATE 10 MG PO TABS
10.0000 mg | ORAL_TABLET | Freq: Every day | ORAL | Status: DC
  Administered 2018-05-05 – 2018-05-06 (×2): 10 mg via ORAL
  Filled 2018-05-05 (×3): qty 1

## 2018-05-05 MED ORDER — ISOSORBIDE MONONITRATE ER 60 MG PO TB24
60.0000 mg | ORAL_TABLET | Freq: Every day | ORAL | Status: DC
  Administered 2018-05-05 – 2018-05-06 (×2): 60 mg via ORAL
  Filled 2018-05-05 (×3): qty 1

## 2018-05-05 NOTE — Progress Notes (Addendum)
Progress Note  Patient Name: Manus GunningSteven S Gryder Date of Encounter: 05/05/2018  Primary Cardiologist: Rollene RotundaJames Hochrein, MD   Subjective   No chest pain or shortness of breath.  States walking in the hall without symptoms  [facility]  Inpatient Medications    Scheduled Meds: . aspirin  81 mg Oral Daily  . atorvastatin  80 mg Oral QPM  . carvedilol  25 mg Oral BID WC  . clopidogrel  75 mg Oral Q breakfast  . sodium chloride flush  3 mL Intravenous Q12H   Continuous Infusions: . sodium chloride    . nitroGLYCERIN 30 mcg/min (05/05/18 0800)   PRN Meds: sodium chloride, acetaminophen, morphine injection, nitroGLYCERIN, ondansetron (ZOFRAN) IV, sodium chloride flush   Vital Signs    Vitals:   05/05/18 0500 05/05/18 0600 05/05/18 0700 05/05/18 0813  BP: 122/63 (!) 143/74 137/79 (!) 162/84  Pulse:      Resp: (!) 24 (!) 22 (!) 22 18  Temp:      TempSrc:      SpO2: 96% 97% 95% 99%  Weight:      Height:        Intake/Output Summary (Last 24 hours) at 05/05/2018 0841 Last data filed at 05/05/2018 0800 Gross per 24 hour  Intake 675.32 ml  Output 1275 ml  Net -599.68 ml   Last 3 Weights 05/04/2018 05/03/2018 03/12/2018  Weight (lbs) 215 lb 4.8 oz 234 lb 227 lb 9.6 oz  Weight (kg) 97.659 kg 106.142 kg 103.239 kg      Telemetry    Sinus rhythm, occasional nonconducted PACs- Personally Reviewed  ECG    4/7, sinus rhythm heart rate 79.  Anterolateral ST/T wave changes are new from 04/05 - Personally Reviewed  Physical Exam   General: Well developed, well nourished, male in no acute distress Head: Eyes PERRLA, No xanthomas.   Normocephalic and atraumatic Lungs: Clear bilaterally to auscultation. Heart: HRRR S1 S2, without MRG.  Pulses are 1-2+ & equal. No JVD. Abdomen: Bowel sounds are present, abdomen soft and non-tender without masses or  hernias noted. Msk: Normal strength and tone for age. Extremities: No clubbing, cyanosis or  edema.    Skin:  No rashes or lesions noted. Neuro: Alert and oriented X 3. Psych:  Good affect, responds appropriately  Labs    Chemistry Recent Labs  Lab 05/03/18 1012 05/04/18 0319  NA 138 137  K 4.1 4.3  CL 106 108  CO2 24 23  GLUCOSE 130* 90  BUN 25* 27*  CREATININE 1.46* 1.36*  CALCIUM 8.5* 8.5*  GFRNONAA 51* 56*  GFRAA 59* >60  ANIONGAP 8 6     Hematology Recent Labs  Lab 05/03/18 1012 05/04/18 0319  WBC 5.5 6.0  RBC 4.01* 3.75*  HGB 12.1* 11.1*  HCT 36.6* 33.6*  MCV 91.3 89.6  MCH 30.2 29.6  MCHC 33.1 33.0  RDW 14.7 15.0  PLT 165 175    Cardiac Enzymes Recent Labs  Lab 05/03/18 1012 05/03/18 1223  TROPONINI <0.03 0.29*   No results for input(s): TROPIPOC in the last 168 hours.   BNP Recent Labs  Lab 05/03/18 1829  BNP 334.6*     Radiology    Dg Chest Port 1 View  Result Date: 05/03/2018 CLINICAL DATA:  Acute onset sharp central chest pain. Hypertension. Coronary artery disease. EXAM: PORTABLE CHEST 1 VIEW COMPARISON:  02/05/2018 FINDINGS: Heart size is within normal limits. Both lungs are clear. Prior CABG again noted.  IMPRESSION: No active disease. Electronically Signed   By: Myles Rosenthal M.D.   On: 05/03/2018 11:05    Cardiac Studies   ------------------------------------------------------------------- ECHO 2018 Study Conclusions  - Left ventricle: The cavity size was normal. There was moderate   concentric hypertrophy. Systolic function was normal. The   estimated ejection fraction was in the range of 50% to 55%.   Inferior hypokinesis. Doppler parameters are consistent with   abnormal left ventricular relaxation (grade 1 diastolic   dysfunction). The E/e&' ratio is between 8-15, suggesting   indeterminate LV filling pressure. - Aortic valve: Trileaflet. Sclerosis without stenosis. There was   no regurgitation. - Aorta: Aortic root dimension: 42 mm (ED). - Aortic root: The aortic root is dilated. - Mitral valve: Mildly thickened  leaflets . There was trivial   regurgitation. - Left atrium: Severely dilated. - Right ventricle: The cavity size was mildly dilated. Systolic   function was normal. - Right atrium: The atrium was mildly dilated. - Tricuspid valve: There was trivial regurgitation. - Pulmonary arteries: PA peak pressure: 22 mm Hg (S). - Inferior vena cava: The vessel was normal in size. The   respirophasic diameter changes were in the normal range (= 50%),   consistent with normal central venous pressure.  Impressions:  - Compared to a prior study in 03/2015, there are no significant   changes.  CARDIAC CATH:   Mid RCA lesion is 100% stenosed.  Ost RCA to Prox RCA lesion is 99% stenosed.  Ost LAD to Prox LAD lesion is 100% stenosed.  Mid Cx to Dist Cx lesion is 100% stenosed.  Origin to Prox Graft lesion is 100% stenosed.  Mid LAD to Dist LAD lesion is 90% stenosed with 100% stenosed side branch in Ost 3rd Sept.  Origin to Prox Graft lesion is 100% stenosed.  Ost Cx to Prox Cx lesion is 99% stenosed.  Scoring balloon angioplasty was performed using a BALLOON WOLVERINE 2.75X10.  Post intervention, there is a 30% residual stenosis.  The left ventricular ejection fraction is 50-55% by visual estimate.  LV end diastolic pressure is normal. Intervention       Patient Profile     63 y.o. male with hx of h/o CAD s/p prior CABG and multiple interventions, HTN, HL, DM, and long h/o noncompliance, mainly due to inability to afford meds. Presented with chest pain and found to have elevated troponins.  Assessment & Plan    1. Chest pain/NSTEMI:  -Pain-free -See cath report, s/p PTCA CFX with improvement in symptoms -Change IV nitro to Imdur, but at a lower dose than his home dose of 120 mg. Start w/ 60 mg qd and uptitrate as needed. - continue ASA/Plavix, ck P2y12  2. CAD with hx of CABG:  - SVG-RCA 100% and DES CFX 2017 - was on ASA/Plavix, ck P2Y12  3. CKD 3: Cr trending down  since admit, recheck today  4. HLD:  - Lipitor compliance poor 2nd $$, but pt says should be better now.  - triglycerides improved on recheck - continue high-dose statin - ck LFTs  5. HTN:  - continue BB - restart amlodipine and Imdur - hold off on losartan for now  Plan: tx telemetry today, adjust meds and increase ambulation - plan d/c in am if does well.   For questions or updates, please contact [facility] Please consult [email or web address] for contact info under        Signed, Theodore Demark, PA-C  05/05/2018, 8:41 AM  Patient seen and examined. Agree with assessment and plan.  Cardiac catheter cessation PCI data reviewed from yesterday.  Yesterday the patient had recurrent chest pain following the procedure which responded to nitroglycerin and morphine.  He is currently pain-free.  Isosorbide orally has been reinstituted and IV nitroglycerin has been weaned and just discontinued at this time.  Will resume amlodipine for blood pressure and concomitant CAD.  Lipid studies are improved with triglycerides now at 188.  A 1.36 today, slightly improved from yesterday.  BP was increased earlier at 162 systolic, now 138/72.  We will hold losartan today and possibly reinstitute tomorrow.  We will transfer from to heart to cardiac telemetry.  Ambulate.  Hopeful discharge tomorrow if remains stable.  Lennette Bihari, MD, [facility] 05/05/2018 9:52 AM

## 2018-05-05 NOTE — Progress Notes (Signed)
[id number] Pt educated per phone as not doing face to face ed with COVID restrictions. Will give MI booklet, diets and ex ed to pt's RN to give to pt. Discussed with pt the importance of MI restrictions, plavix, NTG use, heart healthy food choices, walking for ex, and CRP 2.  Pt stated he is going to take his meds as he did not want to go through this again. Pt stated he is active and will walk for ex. Guidelines for ex given. Discussed CRP 2 and referred to [facility] program. Pt voiced understanding of all ed.  Luetta Nutting RN BSN 05/05/2018 11:14 AM

## 2018-05-05 NOTE — Progress Notes (Signed)
Patient transferred to 6E04 on tele in wheelchair. Yoko RN to receive patient.

## 2018-05-06 DIAGNOSIS — N289 Disorder of kidney and ureter, unspecified: Secondary | ICD-10-CM

## 2018-05-06 DIAGNOSIS — I161 Hypertensive emergency: Secondary | ICD-10-CM

## 2018-05-06 LAB — BASIC METABOLIC PANEL
Anion gap: 8 (ref 5–15)
BUN: 23 mg/dL (ref 8–23)
CO2: 21 mmol/L — ABNORMAL LOW (ref 22–32)
Calcium: 8.4 mg/dL — ABNORMAL LOW (ref 8.9–10.3)
Chloride: 106 mmol/L (ref 98–111)
Creatinine, Ser: 1.43 mg/dL — ABNORMAL HIGH (ref 0.61–1.24)
GFR calc Af Amer: >60 mL/min (ref >60)
GFR calc non Af Amer: 52 mL/min — ABNORMAL LOW (ref >60)
Glucose, Bld: 256 mg/dL — ABNORMAL HIGH (ref 70–99)
Potassium: 4.2 mmol/L (ref 3.5–5.1)
Sodium: 135 mmol/L (ref 135–145)

## 2018-05-06 LAB — CBC
HCT: 33.6 % — ABNORMAL LOW (ref 39.0–52.0)
Hemoglobin: 10.7 g/dL — ABNORMAL LOW (ref 13.0–17.0)
MCH: 28.8 pg (ref 26.0–34.0)
MCHC: 31.8 g/dL (ref 30.0–36.0)
MCV: 90.3 fL (ref 80.0–100.0)
Platelets: 151 10*3/uL (ref 150–400)
RBC: 3.72 MIL/uL — ABNORMAL LOW (ref 4.22–5.81)
RDW: 15.1 % (ref 11.5–15.5)
WBC: 5.6 10*3/uL (ref 4.0–10.5)
nRBC: 0 % (ref 0.0–0.2)

## 2018-05-06 LAB — GLUCOSE, CAPILLARY
Glucose-Capillary: 116 mg/dL — ABNORMAL HIGH (ref 70–99)
Glucose-Capillary: 164 mg/dL — ABNORMAL HIGH (ref 70–99)

## 2018-05-06 MED ORDER — NITROGLYCERIN 0.4 MG SL SUBL: 0.4000 mg | SUBLINGUAL_TABLET | SUBLINGUAL | 2 refills | Status: DC | PRN

## 2018-05-06 NOTE — Progress Notes (Signed)
[id number] Checked with pt by phone to see if any questions re ed done yesterday. Pt stated no questions. Stressed importance again of plavix for stent, walking for ex and heart healthy eating. Luetta Nutting RN BSN 05/06/2018 8:28 AM

## 2018-05-06 NOTE — Discharge Summary (Addendum)
Discharge Summary    Patient ID: Darrell [name],  MRN: [id number], DOB/AGE: [birth year] 62 y.o.  Admit date: 05/03/2018 Discharge date: 05/06/2018  Primary Care Provider: Charlott [name] Primary Cardiologist: Dr. Percival [name]   Discharge Diagnoses    Principal Problem:   ACS (acute coronary syndrome) [facility]) Active Problems:   GERD   HTN (hypertension)   Diabetes mellitus type 2 in nonobese Darrell [name])   Hyperlipidemia with target LDL less than 70   Renal insufficiency   Allergies No Known Allergies  Diagnostic Studies/Procedures    Cath: 05/04/18  Diagnostic  Dominance: Right    Intervention      Mid RCA lesion is 100% stenosed.  Ost RCA to Prox RCA lesion is 99% stenosed.  Ost LAD to Prox LAD lesion is 100% stenosed.  Mid Cx to Dist Cx lesion is 100% stenosed.  Origin to Prox Graft lesion is 100% stenosed.  Mid LAD to Dist LAD lesion is 90% stenosed with 100% stenosed side branch in Ost 3rd Sept.  Origin to Prox Graft lesion is 100% stenosed.  Ost Cx to Prox Cx lesion is 99% stenosed.  Scoring balloon angioplasty was performed using a BALLOON WOLVERINE 2.75X10.  Post intervention, there is a 30% residual stenosis.  The left ventricular ejection fraction is 50-55% by visual estimate.  LV end diastolic pressure is normal.  IMPRESSION: Darrell [name] had anatomy fairly similar to what it was in 2017.  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  The mid to distal AV groove circumflex stent was occluded at this point whereas 3 years ago it was subtotally occluded.  His distal LAD beyond LIMA insertion was diffusely and severely diseased as well.  I performed Cutting Balloon atherectomy reducing a 99% lesion to less than 30%.  Patient tolerated procedure well.  He was already on aspirin Plavix.  Angiomax was turned off.  He left the lab in stable condition on IV nitroglycerin for blood pressure control.   The sheath will be removed and pressure held.  Patient will be gently hydrated and most likely discharged home tomorrow on aspirin and Plavix.  Darrell [name]. MD, [facility] _____________   History of Present Illness     Darrell [name] is a 63 yo male with hx of CABG 2009, with LIMA to LAD, Lt radial graft to marginal VG seq. To the PDA and PLA and reported graft to the diag. With last cath 2014 with occluded VG to PDA and PLA, patent radial graft wo LCX, patent LIMA to LAD with filling retrograde back up to the point of prox. Occlusion, the distal LAD isvery diminutive and has diffuse narrowing with collateralization to the distal RCA.  Treated medically.   Then in 2017 cath was done with patent radial graft to an obtuse marginal branch as a patent LIMA to the mid LAD involving the distal LAD is diffusely diseased throughout its entirety. There are left-to-right collaterals. The native right was occluded as is the graft to the PDA and PLA. He also had high grade "in-stent restenosis" within the proximal codominant circumflex which may be his culprit lesion although he had distal disease as well.  They were able to stent the proximal circumflex however this was a technically challenging the long procedure using 240 mL of contrast. Dr. Gwenlyn [name] elected not to intervene on the distal circumflex at this time but rather treat him medically to assess efficacy of revascularization.   Recent admit 02/06/18  with chest pain and HTN and pk troponin of 2.0  Echo at that time with EF 55-60%, Hypokinesis of the entireinferior myocardium; consistent with ischemia or infarction in the distribution of the right coronary artery.  Darrell [name] LA moderately dilated, and RA mildly dilated. Pt did not wish to have a cardiac cath.  He was placed back on his meds and discharged on 02/08/18.   He was a no show for appt 04/08/18.    Pt presented to Darrell [name] by Darrell [name] with chest pain rated a 9/10.  Had not taken his BP medications since the day  prior waiting for mail-order supply.  He had 324 mg ASA and 1 sl NTG which did decrease pain to 2/10.    Initial troponin was neg but second one 0.29.   Na 138, K+ 4.1 BUN 25, Cr 1.46 Ca 8.5  Hgb 12.1, plts 165, WBC 5.5  CXR no active disease.  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 Blood pressure on arrival was 227/123 but improve to 167/100 by the time of assessment.   Given symptoms he was placed on IV heparin and discussion regarding need for cardiac cath was had. He initially did not want to stay for admission but agreed after conversation.   Hospital Course     Underwent cardiac cath noted above with severe pLcx with severe in ISR with cutting balloon PTCA. Continued on DAPT with ASA/plavix. Troponin peaked at 0.29. Weaned from IV nitro to Darrell [name] at 166m (home dose). Restarted on amlodipine with blood pressures tolerating, along with coreg 268mBID. P2y12 was 159. Reported issues with compliance surrounding medications 2/2 to cost but coverage improved just prior to admission. Cr 1.46>>1.36>>1.64>>1.43. Ambulated without recurrent chest pain. Darrell [name] ordered and completed via phone conversation 2/2 to COVID restrictions.   Darrell [name] seen by Dr. KeClaiborne Billingsnd determined stable for discharge home. Follow up in the office has been arranged. Medications are listed below.   _____________  Discharge Vitals Blood pressure (!) 162/85, pulse 74, temperature 98.2 F (36.8 C), temperature source Oral, resp. rate 17, height _0  (1.854 m), weight 98.5 kg, SpO2 100 %.  Filed Weights   05/03/18 1001 05/04/18 0323 05/06/18 0500  Weight: 106.1 kg 97.7 kg 98.5 kg    Labs & Radiologic Studies    CBC Recent Labs    05/05/18 1654 05/06/18 0408  WBC 5.0 5.6  HGB 11.4* 10.7*  HCT 35.8* 33.6*  MCV 90.6 90.3  PLT 166 15527 Basic Metabolic Panel Recent Labs    05/05/18 0919 05/06/18 0914  NA 136 135  K 4.1 4.2  CL 106 106  CO2 21* 21*  GLUCOSE 135* 256*  BUN  25* 23  CREATININE 1.64* 1.43*  CALCIUM 8.3* 8.4*   Liver Function Tests Recent Labs    05/05/18 0919  AST 19  ALT 10  ALKPHOS 68  BILITOT 0.7  PROT 6.0*  ALBUMIN 2.9*   No results for input(s): LIPASE, AMYLASE in the last 72 hours. Cardiac Enzymes Recent Labs    05/03/18 1223  TROPONINI 0.29*   BNP Invalid input(s): POCBNP D-Dimer No results for input(s): DDIMER in the last 72 hours. Hemoglobin A1C No results for input(s): HGBA1C in the last 72 hours. Fasting Lipid Panel No results for input(s): CHOL, HDL, LDLCALC, TRIG, CHOLHDL, LDLDIRECT in the last 72 hours. Thyroid Function Tests No results for input(s): TSH, T4TOTAL, T3FREE, THYROIDAB in the last 72 hours.  Invalid input(s): FREET3 _____________  Dg Chest Port 1 View  Result Date: 05/03/2018 CLINICAL DATA:  Acute onset sharp central chest pain. Hypertension. Coronary artery disease. EXAM: PORTABLE CHEST 1 VIEW COMPARISON:  02/05/2018 FINDINGS: Heart size is within normal limits. Both lungs are clear. Prior CABG again noted. IMPRESSION: No active disease. Electronically Signed   By: Earle Gell M.D.   On: 05/03/2018 11:05   Disposition   Pt is being discharged home today in good condition.  Follow-up Plans & Appointments    Follow-up Information    Lendon Colonel, NP Follow up on 05/13/2018.   Specialties:  Nurse Practitioner, Radiology, Cardiology Why:  at 10am for your follow up appt. This will be a tele-health evisit through your smart phone or computer. Please make sure your MyChart account is setup Contact information: [address] [place] [address] [phone]          Discharge Instructions    Amb Referral to Cardiac Rehabilitation   Complete by:  As directed    Diagnosis:   NSTEMI PTCA     Call MD for:  redness, tenderness, or signs of infection (pain, swelling, redness, odor or green/yellow discharge around incision site)   Complete by:  As directed    Diet - low  sodium heart healthy   Complete by:  As directed    Discharge instructions   Complete by:  As directed    Groin Site Care Refer to this sheet in the next few weeks. These instructions provide you with information on caring for yourself after your procedure. Your caregiver may also give you more specific instructions. Your treatment has been planned according to current medical practices, but problems sometimes occur. Call your caregiver if you have any problems or questions after your procedure. HOME CARE INSTRUCTIONS You may shower 24 hours after the procedure. Remove the bandage (dressing) and gently wash the site with plain soap and water. Gently pat the site dry.  Do not apply powder or lotion to the site.  Do not sit in a bathtub, swimming pool, or whirlpool for 5 to 7 days.  No bending, squatting, or lifting anything over 10 pounds (4.5 kg) as directed by your caregiver.  Inspect the site at least twice daily.  Do not drive home if you are discharged the same day of the procedure. Have someone else drive you.  You may drive 24 hours after the procedure unless otherwise instructed by your caregiver.  What to expect: Any bruising will usually fade within 1 to 2 weeks.  Blood that collects in the tissue (hematoma) may be painful to the touch. It should usually decrease in size and tenderness within 1 to 2 weeks.  SEEK IMMEDIATE MEDICAL CARE IF: You have unusual pain at the groin site or down the affected leg.  You have redness, warmth, swelling, or pain at the groin site.  You have drainage (other than a small amount of blood on the dressing).  You have chills.  You have a fever or persistent symptoms for more than 72 hours.  You have a fever and your symptoms suddenly get worse.  Your leg becomes pale, cool, tingly, or numb.  You have heavy bleeding from the site. Hold pressure on the site. Marland Kitchen  PLEASE DO NOT MISS ANY DOSES OF YOUR PLAVIX!!!!! Also keep a log of you blood pressures and  bring back to your follow up appt. Please call the office with any questions.   Patients taking blood thinners should generally stay  away from medicines like ibuprofen, Advil, Motrin, naproxen, and Aleve due to risk of stomach bleeding. You may take Tylenol as directed or talk to your primary doctor about alternatives.   Increase activity slowly   Complete by:  As directed        Discharge Medications     Medication List    STOP taking these medications   losartan 100 MG tablet Commonly known as:  COZAAR     TAKE these medications   accu-chek softclix lancets Use as instructed daily.   amLODipine 10 MG tablet Commonly known as:  NORVASC Take 1 tablet (10 mg total) by mouth daily.   aspirin 81 MG EC tablet Take 1 tablet (81 mg total) by mouth daily.   atorvastatin 80 MG tablet Commonly known as:  LIPITOR Take 1 tablet (80 mg total) by mouth every evening.   carvedilol 25 MG tablet Commonly known as:  COREG Take 1 tablet (25 mg total) by mouth 2 (two) times daily with a meal.   clopidogrel 75 MG tablet Commonly known as:  PLAVIX Take 1 tablet (75 mg total) by mouth daily.   glucose blood test strip Commonly known as:  True Metrix Blood Glucose Test 1 each by Other route 3 (three) times daily.   isosorbide mononitrate 120 MG 24 hr tablet Commonly known as:  Darrell [name] Take 1 tablet (120 mg total) by mouth daily.   metFORMIN 500 MG tablet Commonly known as:  GLUCOPHAGE Take 1 tablet (500 mg total) by mouth 2 (two) times daily with a meal.   nitroGLYCERIN 0.4 MG SL tablet Commonly known as:  Nitrostat Place 1 tablet (0.4 mg total) under the tongue every 5 (five) minutes as needed for chest pain (up to 3 doses. If taking 3rd dose call 911).   True Metrix Meter w/Device Kit USE AS INSTRUCTED   TRUEplus Lancets 28G Misc USE AS INSTRUCTED          Acute coronary syndrome (MI, NSTEMI, STEMI, etc) this admission?: Yes.     AHA/ACC Clinical Performance & Quality  Measures: 1. Aspirin prescribed? - Yes 2. ADP Receptor Inhibitor (Plavix/Clopidogrel, Brilinta/Ticagrelor or Effient/Prasugrel) prescribed (includes medically managed patients)? - Yes 3. Beta Blocker prescribed? - Yes 4. High Intensity Statin (Lipitor 40-53m or Crestor 20-458m prescribed? - Yes 5. EF assessed during THIS hospitalization? - Yes 6. For EF <40%, was ACEI/[name] prescribed? - Not Applicable (EF >/= 4014%7. For EF <40%, Aldosterone Antagonist (Spironolactone or Eplerenone) prescribed? - Not Applicable (EF >/= 4097%8. Cardiac [name] Darrell II ordered (Included Medically managed Patients)? - Yes      Outstanding Labs/Studies   BMET in 2 weeks. Consider restarting Losartan back if Cr stable.  Duration of Discharge Encounter   Greater than 30 minutes including physician time.  Signed, LiReino BellisP-C 05/06/2018, 12:03 PM    Patient seen and examined. Agree with assessment and plan.  Patient feels well, day 2 status post PCI to his proximal circumflex stent secondary to in-stent restenosis.  No recurrent anginal symptomatology. Telemetry revealed sinus rhythm at 73.  Continue aspirin and plavix.  Continue atorvastatin 80 mg.  Will resume either losartan or start amlodipine for improved blood pressure control.  Creatinine 1.43 today, improved from 1.64 yesterday.  Okay for discharge, with telemedicine follow-up evaluation in 1 to 2 weeks.   ThTroy SineMD, [facility]/08/2018 12:03 PM

## 2018-05-06 NOTE — Consult Note (Signed)
   [facility] Inpatient Consult   05/06/2018  Darrell [name] [birth year] [id number]   Patient was assessed for [facility] Care Management for community services. Patient was previously active with [facility] Care Management.  Attempted phone call regarding being restarted with [facility] services. No answer and a HIPAA appropriate message was left. Patient admitted with NSTEMI.    Of note, [facility] Care Management services does not replace or interfere with any services that are arranged by inpatient case management or social work.   For additional questions or referrals please contact:  Charlesetta Shanks, RN BSN CCM [facility]  [phone] business mobile phone Toll free office [phone]

## 2018-05-07 ENCOUNTER — Telehealth: Payer: Self-pay

## 2018-05-07 NOTE — Telephone Encounter (Signed)
Transition Care Management Follow-up Telephone Call  Date of discharge and from where: 05/06/2018, [facility]  How have you been since you were released from the hospital? He said he is just waiting for his medications.   Any questions or concerns? His only concern was to obtain his medications as soon as possible  He reported no signs of bleeding/infection from cath site. Instructed regarding signs/sympoms of infections and when to call MD.   His wife explained that they are homeless and the extended stay is expensive.  She was interested in resources to assist with housing and helping to get clothing for her husband.  Provided her with contact information for [facility] as a place to start and instructed her to call this CM back if more assistance is needed.   Items Reviewed:  Did the pt receive and understand the discharge instructions provided? He stated that he has his discharge instructions and medication list and does not have any questions and does not need to review the medication list. His wife also stated that she did not need to review the medication list.   Medications obtained and verified? He stated that he does not have any medications and was waiting for a delivery from [facility] pharmacy. Informed him that this CM would check status of delivery with the Pharmacy. When this CM called back to provide update,  he said they they had just been delivered. He then put his wife on the phone. Instructed her to review all medications received and compare to discharge instructions and call this CM back if she has any questions and she said she would make sure that she has all medications.   Any new allergies since your discharge? None reported  Do you have support at home? Living in [facility] with his wife.   Other (ie: DME, Home Health, etc) has glucometer, lancets and test strips. Blood sugar this morning  - 155.  No home health ordered  Has been referred to [facility]  He has  been referred to cardiac rehab.   Functional Questionnaire: (I = Independent and D = Dependent) ADL's: wife assists as needed. Has walker and wheelchair. Uses wheelchair when going a longer distance.    Follow up appointments reviewed:    PCP Hospital f/u appt confirmed? 05/13/2018 @ 1050  -  televisit or office visit as determined by Dr Alvis Lemmings.   Specialist Hospital f/u appt confirmed? Cardiology appointment needed.   Are transportation arrangements needed? No, Uses [facility]  - informed patient and his wife that [employer] [facility] and [employer] are free now.   If their condition worsens, is the pt aware to call  their PCP or go to the ED? Yes   Was the patient provided with contact information for the PCP's office or ED? Yes, he has the phone number for the clinic  Was the pt encouraged to call back with questions or concerns?yes

## 2018-05-07 NOTE — Telephone Encounter (Signed)
I completed discharge call with the patient and his wife. His medications were delivered today. When I first spoke with him they were not there yet but when I called him back to provide an update on delivery he said they arrived.    He offered no other complaints/concerns. His wife spoke about being homeless/ needing clothes. Provided her with contact information for [facility].  Appointment with you 05/13/2018.

## 2018-05-07 NOTE — Telephone Encounter (Signed)
4-15 @ 10am [id number]

## 2018-05-07 NOTE — Telephone Encounter (Signed)
PT IS IN ROOM #138 HE HAS NO CELL OR MYCHART OR EMAIL. HE STATES THAT HE DOES NOT WANT TO HAVE A VISIT AT THIS TIME. HE WILL CB FOR APPT. I WILL CANCEL HIS APPOINTMENT. APPOINTMENT CANCELLED

## 2018-05-13 ENCOUNTER — Ambulatory Visit: Payer: Medicare Other | Attending: Family Medicine | Admitting: Family Medicine

## 2018-05-13 ENCOUNTER — Telehealth: Payer: Medicare Other | Admitting: Adult Health

## 2018-05-13 ENCOUNTER — Other Ambulatory Visit: Payer: Self-pay

## 2018-05-13 ENCOUNTER — Encounter: Payer: Self-pay | Admitting: Family Medicine

## 2018-05-13 DIAGNOSIS — I10 Essential (primary) hypertension: Secondary | ICD-10-CM

## 2018-05-13 DIAGNOSIS — I249 Acute ischemic heart disease, unspecified: Secondary | ICD-10-CM

## 2018-05-13 DIAGNOSIS — E119 Type 2 diabetes mellitus without complications: Secondary | ICD-10-CM

## 2018-05-13 DIAGNOSIS — E785 Hyperlipidemia, unspecified: Secondary | ICD-10-CM

## 2018-05-13 NOTE — Progress Notes (Signed)
Virtual Visit via Telephone Note  I connected with Darrell [name] on 05/13/18 at 10:50 AM EDT by telephone and verified that I am speaking with the correct person using two identifiers.   I discussed the limitations, risks, security and privacy concerns of performing an evaluation and management service by telephone and the availability of in person appointments. I also discussed with the patient that there may be a patient responsible charge related to this service. The patient expressed understanding and agreed to proceed.   History of Present Illness: Darrell [name] is a 63 year old male with a history of coronary artery disease (status post CABG in 2009, status post stent in 03/2015) with type 2 diabetes mellitus (A1c 5.2), hypertension, hospitalization for NSTEMI in 01/2018 at which time he declined a cardiac cath, recently hospitalized for acute coronary syndrome [facility] from 05/03/2018 through 05/06/2018.  He had presented with chest pain to the ED and had endorsed running out of his medications, blood pressure was 227/123.  Troponins were 0.03, 0.29, EKG revealed old anteroseptal infarct.   He underwent cardiac cath which revealed   Mid RCA lesion is 100% stenosed.  Ost RCA to Prox RCA lesion is 99% stenosed.  Ost LAD to Prox LAD lesion is 100% stenosed.  Mid Cx to Dist Cx lesion is 100% stenosed.  Origin to Prox Graft lesion is 100% stenosed.  Mid LAD to Dist LAD lesion is 90% stenosed with 100% stenosed side branch in Ost 3rd Sept.  Origin to Prox Graft lesion is 100% stenosed.  Ost Cx to Prox Cx lesion is 99% stenosed.  Scoring balloon angioplasty was performed using a BALLOON WOLVERINE 2.75X10.  Post intervention, there is a 30% residual stenosis.  The left ventricular ejection fraction is 50-55% by visual estimate.  LV end diastolic pressure is normal.  He underwent cardiac balloon atherectomy with reduction in stenosis from 99% to 30%.  Today he  denies chest pain, dyspnea, palpitations and endorses compliance with his medications.  He has no means of checking his blood pressures at home.  Of note his blood sugar was 160 and this morning and he denies hypoglycemic episodes. He has no additional concerns today.   Observations/Objective: Alert, awake, oriented x3 Not in acute distress  CMP Latest Ref Rng & Units 05/06/2018 05/05/2018 05/04/2018  Glucose 70 - 99 mg/dL 161(W256(H) 960(A135(H) 90  BUN 8 - 23 mg/dL 23 54(U25(H) 98(J27(H)  Creatinine 0.61 - 1.24 mg/dL 1.91(Y1.43(H) 7.82(N1.64(H) 5.62(Z1.36(H)  Sodium 135 - 145 mmol/L 135 136 137  Potassium 3.5 - 5.1 mmol/L 4.2 4.1 4.3  Chloride 98 - 111 mmol/L 106 106 108  CO2 22 - 32 mmol/L 21(L) 21(L) 23  Calcium 8.9 - 10.3 mg/dL 3.0(Q8.4(L) 8.3(L) 8.5(L)  Total Protein 6.5 - 8.1 g/dL - 6.0(L) -  Total Bilirubin 0.3 - 1.2 mg/dL - 0.7 -  Alkaline Phos 38 - 126 U/L - 68 -  AST 15 - 41 U/L - 19 -  ALT 0 - 44 U/L - 10 -    Lipid Panel     Component Value Date/Time   CHOL 138 03/03/2016 0235   TRIG 48 03/03/2016 0235   HDL 28 (L) 03/03/2016 0235   CHOLHDL 4.9 03/03/2016 0235   VLDL 10 03/03/2016 0235   LDLCALC 100 (H) 03/03/2016 0235    Lab Results  Component Value Date   HGBA1C 5.2 02/05/2018    Assessment and Plan: 1. ACS (acute coronary syndrome) [facility](HCC) Previous history of CABG Asymptomatic Status  post cutting balloon atherectomy with reduction of 99% lesion to less than 30% Continue Plavix and aspirin Follow-up with cardiology  2. Diabetes mellitus type 2 in nonobese (HCC) Controlled with A1c of 5.2 Continue metformin  3. Hyperlipidemia with target LDL less than 70 Currently on high-dose statin Low-cholesterol diet  4. Essential hypertension Unable to check blood pressures at home and has been advised to check when he is able to go to the store Continue current regimen Counseled on blood pressure goal of less than 130/80, low-sodium, DASH diet, medication compliance, 150 minutes of moderate intensity  exercise per week. Discussed medication compliance, adverse effects.  I reviewed his medications personally with him.  Follow Up Instructions: Return in about 3 months (around 08/12/2018) for Follow-up of chronic medical conditions, keep previously scheduled appointment.    I discussed the assessment and treatment plan with the patient. The patient was provided an opportunity to ask questions and all were answered. The patient agreed with the plan and demonstrated an understanding of the instructions.   The patient was advised to call back or seek an in-person evaluation if the symptoms worsen or if the condition fails to improve as anticipated.  I provided 10 minutes of non-face-to-face time during this encounter.   Hoy Register, MD

## 2018-05-13 NOTE — Progress Notes (Signed)
Patient called and verified by name and date of birth.  Patient indicated that he felt well but did have chest pain intermittently and had taken nitro with relief.  Pain located left chest.

## 2018-06-05 MED FILL — ?ATORVASTATIN 40MG TABLET: 40 | 30 days supply | Qty: 60 | Fill #0

## 2018-06-05 MED FILL — ?METFORMIN HCL 500MG TABLET: 500 | 30 days supply | Qty: 60 | Fill #2

## 2018-06-05 MED FILL — ?CARVEDILOL 25 MG TABLET: 25 | 30 days supply | Qty: 60 | Fill #2

## 2018-06-05 MED FILL — CLOPIDOGREL 75 MG TABLET: 75 | 30 days supply | Qty: 30 | Fill #2

## 2018-06-05 MED FILL — NITROGLYCERIN 0.4 MG TAB SL: 0.4 | 10 days supply | Qty: 25 | Fill #2

## 2018-06-05 MED FILL — LOSARTAN POTASSIUM 100 MG T: 100 | 30 days supply | Qty: 30 | Fill #2

## 2018-06-05 MED FILL — ISOSORBIDE MN 120 MG TAB SA: 120 | 30 days supply | Qty: 30 | Fill #2

## 2018-06-05 MED FILL — ?AMLODIPINE BESYLATE 10 MG: 10 | 30 days supply | Qty: 30 | Fill #2

## 2018-06-15 ENCOUNTER — Other Ambulatory Visit: Payer: Self-pay

## 2018-06-15 ENCOUNTER — Ambulatory Visit: Payer: Medicare Other | Attending: Family Medicine | Admitting: Family Medicine

## 2018-07-14 ENCOUNTER — Other Ambulatory Visit: Payer: Self-pay | Admitting: Family Medicine

## 2018-07-14 MED FILL — ?CARVEDILOL 25 MG TABLET: 25 | 30 days supply | Qty: 60 | Fill #3

## 2018-07-14 MED FILL — ISOSORBIDE MN 120 MG TAB SA: 120 | 30 days supply | Qty: 30 | Fill #3

## 2018-07-14 MED FILL — metFORMIN HCL 500 MG TABS: 500 | 30 days supply | Qty: 60 | Fill #3

## 2018-07-14 MED FILL — ?AMLODIPINE BESYLATE 10 MG: 10 | 30 days supply | Qty: 30 | Fill #3

## 2018-07-14 MED FILL — CLOPIDOGREL 75 MG TABLET: 75 | 30 days supply | Qty: 30 | Fill #3

## 2018-07-14 MED FILL — ?ATORVASTATIN 40MG TABLET: 40 | 30 days supply | Qty: 60 | Fill #1

## 2018-07-31 ENCOUNTER — Encounter (HOSPITAL_COMMUNITY): Payer: Self-pay | Admitting: General Practice

## 2018-07-31 ENCOUNTER — Emergency Department (HOSPITAL_COMMUNITY): Payer: Medicare Other

## 2018-07-31 ENCOUNTER — Observation Stay (HOSPITAL_COMMUNITY)
Admission: EM | Admit: 2018-07-31 | Discharge: 2018-08-01 | Disposition: A | Discharge status: Home / Self Care | Payer: Medicare Other | Attending: Internal Medicine | Admitting: Internal Medicine

## 2018-07-31 ENCOUNTER — Other Ambulatory Visit: Payer: Self-pay

## 2018-07-31 DIAGNOSIS — Z7901 Long term (current) use of anticoagulants: Secondary | ICD-10-CM | POA: Insufficient documentation

## 2018-07-31 DIAGNOSIS — E119 Type 2 diabetes mellitus without complications: Secondary | ICD-10-CM | POA: Insufficient documentation

## 2018-07-31 DIAGNOSIS — Z79899 Other long term (current) drug therapy: Secondary | ICD-10-CM | POA: Diagnosis not present

## 2018-07-31 DIAGNOSIS — Z7984 Long term (current) use of oral hypoglycemic drugs: Secondary | ICD-10-CM | POA: Insufficient documentation

## 2018-07-31 DIAGNOSIS — Z87891 Personal history of nicotine dependence: Secondary | ICD-10-CM | POA: Diagnosis not present

## 2018-07-31 DIAGNOSIS — Z951 Presence of aortocoronary bypass graft: Secondary | ICD-10-CM | POA: Diagnosis not present

## 2018-07-31 DIAGNOSIS — Z7982 Long term (current) use of aspirin: Secondary | ICD-10-CM | POA: Insufficient documentation

## 2018-07-31 DIAGNOSIS — N183 Chronic kidney disease, stage 3 (moderate): Secondary | ICD-10-CM | POA: Insufficient documentation

## 2018-07-31 DIAGNOSIS — R079 Chest pain, unspecified: Secondary | ICD-10-CM | POA: Diagnosis present

## 2018-07-31 DIAGNOSIS — Z20828 Contact with and (suspected) exposure to other viral communicable diseases: Secondary | ICD-10-CM | POA: Diagnosis not present

## 2018-07-31 DIAGNOSIS — R0789 Other chest pain: Principal | ICD-10-CM | POA: Insufficient documentation

## 2018-07-31 DIAGNOSIS — R042 Hemoptysis: Secondary | ICD-10-CM | POA: Diagnosis not present

## 2018-07-31 DIAGNOSIS — I129 Hypertensive chronic kidney disease with stage 1 through stage 4 chronic kidney disease, or unspecified chronic kidney disease: Secondary | ICD-10-CM | POA: Insufficient documentation

## 2018-07-31 DIAGNOSIS — K068 Other specified disorders of gingiva and edentulous alveolar ridge: Secondary | ICD-10-CM

## 2018-07-31 DIAGNOSIS — R58 Hemorrhage, not elsewhere classified: Secondary | ICD-10-CM | POA: Diagnosis not present

## 2018-07-31 DIAGNOSIS — I214 Non-ST elevation (NSTEMI) myocardial infarction: Secondary | ICD-10-CM | POA: Diagnosis not present

## 2018-07-31 DIAGNOSIS — I10 Essential (primary) hypertension: Secondary | ICD-10-CM | POA: Diagnosis not present

## 2018-07-31 LAB — CBC WITH DIFFERENTIAL/PLATELET
Abs Immature Granulocytes: 0.03 10*3/uL (ref 0.00–0.07)
Basophils Absolute: 0 10*3/uL (ref 0.0–0.1)
Basophils Relative: 0 %
Eosinophils Absolute: 0.1 10*3/uL (ref 0.0–0.5)
Eosinophils Relative: 2 %
HCT: 40.7 % (ref 39.0–52.0)
Hemoglobin: 13.2 g/dL (ref 13.0–17.0)
Immature Granulocytes: 0 %
Lymphocytes Relative: 22 %
Lymphs Abs: 1.6 10*3/uL (ref 0.7–4.0)
MCH: 28.8 pg (ref 26.0–34.0)
MCHC: 32.4 g/dL (ref 30.0–36.0)
MCV: 88.7 fL (ref 80.0–100.0)
Monocytes Absolute: 0.7 10*3/uL (ref 0.1–1.0)
Monocytes Relative: 9 %
Neutro Abs: 4.8 10*3/uL (ref 1.7–7.7)
Neutrophils Relative %: 67 %
Platelets: 205 10*3/uL (ref 150–400)
RBC: 4.59 MIL/uL (ref 4.22–5.81)
RDW: 13.3 % (ref 11.5–15.5)
WBC: 7.2 10*3/uL (ref 4.0–10.5)
nRBC: 0 % (ref 0.0–0.2)

## 2018-07-31 LAB — BASIC METABOLIC PANEL
Anion gap: 10 (ref 5–15)
BUN: 26 mg/dL — ABNORMAL HIGH (ref 8–23)
CO2: 23 mmol/L (ref 22–32)
Calcium: 9 mg/dL (ref 8.9–10.3)
Chloride: 101 mmol/L (ref 98–111)
Creatinine, Ser: 1.57 mg/dL — ABNORMAL HIGH (ref 0.61–1.24)
GFR calc Af Amer: 54 mL/min — ABNORMAL LOW (ref >60)
GFR calc non Af Amer: 47 mL/min — ABNORMAL LOW (ref >60)
Glucose, Bld: 127 mg/dL — ABNORMAL HIGH (ref 70–99)
Potassium: 4.4 mmol/L (ref 3.5–5.1)
Sodium: 134 mmol/L — ABNORMAL LOW (ref 135–145)

## 2018-07-31 LAB — TROPONIN I (HIGH SENSITIVITY)
Troponin I (High Sensitivity): 20 ng/L — ABNORMAL HIGH (ref <18)
Troponin I (High Sensitivity): 14 ng/L (ref <18)

## 2018-07-31 LAB — GLUCOSE, CAPILLARY: Glucose-Capillary: 154 mg/dL — ABNORMAL HIGH (ref 70–99)

## 2018-07-31 MED ORDER — NITROGLYCERIN 2 % TD OINT
1.0000 [in_us] | TOPICAL_OINTMENT | Freq: Once | TRANSDERMAL | Status: AC
  Administered 2018-07-31: 1 [in_us] via TOPICAL
  Filled 2018-07-31: qty 1

## 2018-07-31 MED ORDER — METFORMIN HCL 500 MG PO TABS
500.0000 mg | ORAL_TABLET | Freq: Two times a day (BID) | ORAL | Status: DC
  Administered 2018-07-31 – 2018-08-01 (×2): 500 mg via ORAL
  Filled 2018-07-31 (×2): qty 1

## 2018-07-31 MED ORDER — ACETAMINOPHEN 325 MG PO TABS
650.0000 mg | ORAL_TABLET | Freq: Four times a day (QID) | ORAL | Status: DC | PRN
  Administered 2018-07-31: 650 mg via ORAL
  Filled 2018-07-31: qty 2

## 2018-07-31 MED ORDER — CARVEDILOL 25 MG PO TABS
25.0000 mg | ORAL_TABLET | Freq: Two times a day (BID) | ORAL | Status: DC
  Administered 2018-07-31 – 2018-08-01 (×2): 25 mg via ORAL
  Filled 2018-07-31 (×2): qty 1

## 2018-07-31 MED ORDER — CLOPIDOGREL BISULFATE 75 MG PO TABS
75.0000 mg | ORAL_TABLET | Freq: Every day | ORAL | Status: DC
  Administered 2018-07-31 – 2018-08-01 (×2): 75 mg via ORAL
  Filled 2018-07-31 (×2): qty 1

## 2018-07-31 MED ORDER — ISOSORBIDE MONONITRATE ER 60 MG PO TB24
120.0000 mg | ORAL_TABLET | Freq: Every day | ORAL | Status: DC
  Administered 2018-07-31 – 2018-08-01 (×2): 120 mg via ORAL
  Filled 2018-07-31 (×2): qty 2

## 2018-07-31 MED ORDER — HEPARIN SODIUM (PORCINE) 5000 UNIT/ML IJ SOLN
5000.0000 [IU] | Freq: Three times a day (TID) | INTRAMUSCULAR | Status: DC
  Administered 2018-07-31 – 2018-08-01 (×3): 5000 [IU] via SUBCUTANEOUS
  Filled 2018-07-31 (×3): qty 1

## 2018-07-31 MED ORDER — ATORVASTATIN CALCIUM 80 MG PO TABS
80.0000 mg | ORAL_TABLET | Freq: Every evening | ORAL | Status: DC
  Administered 2018-07-31: 80 mg via ORAL
  Filled 2018-07-31: qty 1

## 2018-07-31 MED ORDER — ASPIRIN EC 81 MG PO TBEC
81.0000 mg | DELAYED_RELEASE_TABLET | Freq: Every day | ORAL | Status: DC
  Administered 2018-07-31 – 2018-08-01 (×2): 81 mg via ORAL
  Filled 2018-07-31 (×3): qty 1

## 2018-07-31 MED ORDER — NITROGLYCERIN 0.4 MG SL SUBL: 0.4000 mg | SUBLINGUAL_TABLET | SUBLINGUAL | Status: DC | PRN

## 2018-07-31 MED ORDER — AMLODIPINE BESYLATE 10 MG PO TABS
10.0000 mg | ORAL_TABLET | Freq: Every day | ORAL | Status: DC
  Administered 2018-07-31 – 2018-08-01 (×2): 10 mg via ORAL
  Filled 2018-07-31: qty 2
  Filled 2018-07-31: qty 1

## 2018-07-31 MED ORDER — ACETAMINOPHEN 650 MG RE SUPP: 650.0000 mg | Freq: Four times a day (QID) | RECTAL | Status: DC | PRN

## 2018-07-31 MED ORDER — HYDRALAZINE HCL 20 MG/ML IJ SOLN: 5.0000 mg | Freq: Three times a day (TID) | INTRAMUSCULAR | Status: DC | PRN

## 2018-07-31 NOTE — ED Notes (Signed)
Pt has been spitting out blood from his mouth due to the poor health of his gums & teeth (per pt). Pt denies any emesis or even feeling nauseous.

## 2018-07-31 NOTE — ED Notes (Signed)
Attending at bedside.

## 2018-07-31 NOTE — ED Provider Notes (Signed)
Patient signed to me by Dr. Christy Gentles pending repeat troponin.  Troponin results noted and discussed with cardiology, Dr. Caryl Comes, who requests medicine admission   Lacretia Leigh, MD 07/31/18 1051

## 2018-07-31 NOTE — ED Notes (Signed)
Pt's gown has been changed and wet wash cloths given and instructed for cleaning pt's chest, face & hands from the blood splatter that happens when he spits while holding the container too far away from his mouth causing the splatter go in a wide range around him. Pt encouraged to hold the container closer to his mouth to reduce splatter.

## 2018-07-31 NOTE — ED Triage Notes (Signed)
Sudden chest pain this morning that originated in the central chest/ non-radiating. States 9/10. Previous cardiac cath on 4/ On arrival ems BP 220/110, 1 nitro given. Subsequent bp 200/104, pain 8/10. Denies N/V, SOB, Nuimbness, tingling. Also c/o bleeding from gums.

## 2018-07-31 NOTE — ED Provider Notes (Signed)
cardiology called back Requesting repeat troponin and then call back for consult Signed out to dr Zenia Resides at shift change    Ripley Fraise, MD 07/31/18 [phone]

## 2018-07-31 NOTE — ED Provider Notes (Signed)
[facility] EMERGENCY DEPARTMENT Provider Note   CSN: [id number] Arrival date & time: 07/31/18  0533    History   Chief Complaint Chief Complaint  Patient presents with  . Chest Pain    HPI Darrell [name] is a 63 y.o. male.  HPI: A 63 year old patient with a history of treated diabetes, hypertension and hypercholesterolemia presents for evaluation of chest pain. Initial onset of pain was less than one hour ago. The patient's chest pain is sharp, is not worse with exertion and is relieved by nitroglycerin. The patient's chest pain is middle- or left-sided, is not well-localized, is not described as heaviness/pressure/tightness and does not radiate to the arms/jaw/neck. The patient does not complain of nausea and denies diaphoresis. The patient has a family history of coronary artery disease in a first-degree relative with onset less than age 47. The patient has no history of stroke, has no history of peripheral artery disease, has not smoked in the past 90 days and does not have an elevated BMI (>=30).    Chest Pain Associated symptoms: no fever, no numbness, no shortness of breath and no weakness   Patient reports waking up with chest pain.  He reports it is sharp in nature.  He reports similar episodes when he had MIs in the past.  He was given nitroglycerin in route with some improvement  Past Medical History:  Diagnosis Date  . CKD (chronic kidney disease), stage III ([place])   . Claudication ([place])   . Coronary artery disease    a. 08/2001 s/p DES to RCA/RI;  b. 07/2006 s/p DES to p/d LCX;  c. 11/2007 CABGx5 (LIMA->LAD, VG->D1, LRA->OM1, VG->PDA->LPL); c. 03/2015: Synergy DES to mid-Cx in the setting of NSTEMI; d. 11/2015 NSTEMI->Med Rx. e. NSTEMI 01/2018 -> med rx.  . Diabetes mellitus type 2 in nonobese ([place])    a. A1c 7.0 09/2012.  . Diastolic dysfunction    a. 02/2016 Echo: EF 50-55%, gr1DD, Ao sclerosis, dil Ao root (2m), sev dil LA, triv TR, PASP 250mg.  . Marland KitchenHyperlipidemia   . Hypertension   . Noncompliance   . Right carotid bruit    a. noted 01/2018, will need OP eval when patient complies with follow-up.    Patient Active Problem List   Diagnosis Date Noted  . Renal insufficiency   . ACS (acute coronary syndrome) (HC[facility]04/05/2018  . Mild anemia 02/08/2018  . CKD (chronic kidney disease), stage III ([place]  . Diabetes mellitus type 2 in nonobese (HCC)   . Hyperlipidemia with target LDL less than 70   . Hypertension   . Right carotid bruit   . Sepsis (HCC[facility]seta02/03/2016  . Chest pain   . Hyponatremia 01/03/2016  . Hypertensive emergency   . NSTEMI (non-ST elevated myocardial infarction) (HCRoutt03/07/2015  . HTN (hypertension) 04/04/2015  . Hypertensive urgency 03/25/2014  . History of noncompliance with medical treatment, presenting hazards to health 03/25/2014  . Claudication (HCClara10/01/2012  . GERD 10/23/2007  . NEPHROLITHIASIS 10/23/2007  . HYPERCHOLESTEROLEMIA 10/22/2007  . ADJUSTMENT DISORDER WITH DEPRESSED MOOD 10/22/2007  . Type 2 diabetes mellitus without complication, without long-term current use of insulin (HCDer[place]01/01/2003  . Coronary atherosclerosis, s/p CABG in 2009  01/28/2001    Past Surgical History:  Procedure Laterality Date  . CARDIAC CATHETERIZATION N/A 04/06/2015   Procedure: Left Heart Cath and Cors/Grafts Angiography;  Surgeon: JoLorretta HarpMD;  [facility];  Service: Cardiovascular;  Laterality: N/A;  .  CARDIAC CATHETERIZATION N/A 04/06/2015   Procedure: Coronary Stent Intervention;  Surgeon: Lorretta Harp, MD;  2.5 mm x 12 mm long Synergy DES followed by  2.5 mm x 16 mm long Synergy DES    . CORONARY ARTERY BYPASS GRAFT     2009 LIMA to LAD, SVG to Diag, SVG to PDA and PL, left radial to OM  . CORONARY BALLOON ANGIOPLASTY N/A 05/04/2018   Procedure: CORONARY BALLOON ANGIOPLASTY;  Surgeon: Lorretta Harp, MD;  Location: [facility];  Service: Cardiovascular;  Laterality: N/A;   Prox CFX  . LEFT HEART CATH N/A 10/24/2012   Procedure: LEFT HEART CATH;  Surgeon: Troy Sine, MD;  Location: [facility] LAB;  Service: Cardiovascular;  Laterality: N/A;  . LEFT HEART CATH AND CORS/GRAFTS ANGIOGRAPHY N/A 05/04/2018   Procedure: LEFT HEART CATH AND CORS/GRAFTS ANGIOGRAPHY;  Surgeon: Lorretta Harp, MD;  Location: [facility] CV LAB;  Service: Cardiovascular;  Laterality: N/A;        Home Medications    Prior to Admission medications   Medication Sig Start Date End Date Taking? Authorizing Provider  amLODipine (NORVASC) 10 MG tablet Take 1 tablet (10 mg total) by mouth daily. 03/12/18   [name], MD  aspirin 81 MG EC tablet Take 1 tablet (81 mg total) by mouth daily. 03/12/18   [name], MD  atorvastatin (LIPITOR) 80 MG tablet Take 1 tablet (80 mg total) by mouth every evening. 03/12/18   [name], MD  Blood Glucose Monitoring Suppl (TRUE METRIX METER) w/Device KIT USE AS INSTRUCTED 07/05/16   [name], MD  carvedilol (COREG) 25 MG tablet Take 1 tablet (25 mg total) by mouth 2 (two) times daily with a meal. 03/12/18   [name], MD  clopidogrel (PLAVIX) 75 MG tablet Take 1 tablet (75 mg total) by mouth daily. 03/12/18   [name], MD  glucose blood (TRUE METRIX BLOOD GLUCOSE TEST) test strip 1 each by Other route 3 (three) times daily. 03/12/18   [name], MD  isosorbide mononitrate (IMDUR) 120 MG 24 hr tablet Take 1 tablet (120 mg total) by mouth daily. 03/12/18   [name], MD  Lancet Devices [facility]) lancets Use as instructed daily. 07/05/16   [name], MD  metFORMIN (GLUCOPHAGE) 500 MG tablet Take 1 tablet (500 mg total) by mouth 2 (two) times daily with a meal. 03/12/18   Newlin, Enobong, MD  nitroGLYCERIN (NITROSTAT) 0.4 MG SL tablet PLACE 1 TABLET (0.4 MG TOTAL) UNDER THE TONGUE EVERY 5 (FIVE) MINUTES AS NEEDED FOR CHEST PAIN (UP TO 3 DOSES. IF TAKING 3RD DOSE CALL 911) 07/16/18   [name], MD  TRUEPLUS  LANCETS 28G MISC USE AS INSTRUCTED 07/05/16   [name], MD    Family History Family History  Problem Relation Age of Onset  . Heart failure Mother   . Cancer Mother   . CAD Mother 28  . Heart failure Father   . Kidney failure Brother     Social History Social History   Tobacco Use  . Smoking status: Former Smoker    Quit date: 10/25/2010    Years since quitting: 7.7  . Smokeless tobacco: Never Used  Substance Use Topics  . Alcohol use: No    Comment: sts he drank for the first time in a long time last night   . Drug use: No     Allergies   Patient has no known allergies.   Review of Systems Review of  Systems  Constitutional: Negative for fever.  Respiratory: Negative for shortness of breath.   Cardiovascular: Positive for chest pain. Negative for leg swelling.  Neurological: Negative for weakness and numbness.  All other systems reviewed and are negative.    Physical Exam Updated Vital Signs BP (!) 216/115   Pulse 89   Resp 11   Ht 1.753 m ('5\' 9"'$ )   Wt 98.5 kg   SpO2 98%   BMI 32.07 kg/m   Physical Exam CONSTITUTIONAL: Disheveled, no acute distress HEAD: Normocephalic/atraumatic EYES: EOMI ENMT: Mucous membranes moist NECK: supple no meningeal signs SPINE/BACK:entire spine nontender CV: S1/S2 noted, no murmurs/rubs/gallops noted LUNGS: Lungs are clear to auscultation bilaterally, no apparent distress ABDOMEN: soft, nontender, no rebound or guarding, bowel sounds noted throughout abdomen GU:no cva tenderness NEURO: Pt is awake/alert/appropriate, moves all extremitiesx4.  No facial droop.   EXTREMITIES:  full ROM, no calf tenderness or edema SKIN: warm, color normal PSYCH: no abnormalities of mood noted, alert and oriented to situation   ED Treatments / Results  Labs (all labs ordered are listed, but only abnormal results are displayed) Labs Reviewed  BASIC METABOLIC PANEL - Abnormal; Notable for the following components:      Result Value    Sodium 134 (*)    Glucose, Bld 127 (*)    BUN 26 (*)    Creatinine, Ser 1.57 (*)    GFR calc non Af Amer 47 (*)    GFR calc Af Amer 54 (*)    All other components within normal limits  TROPONIN I (HIGH SENSITIVITY) - Abnormal; Notable for the following components:   Troponin I (High Sensitivity) 20 (*)    All other components within normal limits  CBC WITH DIFFERENTIAL/PLATELET  TROPONIN I (HIGH SENSITIVITY)    EKG EKG Interpretation  Date/Time:  Friday July 31 2018 05:39:38 EDT Ventricular Rate:  94 PR Interval:    QRS Duration: 113 QT Interval:  386 QTC Calculation: 483 R Axis:   88 Text Interpretation:  Sinus rhythm Prolonged PR interval Consider left atrial enlargement Anterior infarct, old Abnormal ekg Confirmed by Ripley Fraise [phone]) on 07/31/2018 5:43:16 AM   Radiology Dg Chest Port 1 View  Result Date: 07/31/2018 CLINICAL DATA:  Sudden onset chest pain that began this morning. EXAM: PORTABLE CHEST 1 VIEW COMPARISON:  Chest x-ray dated May 03, 2018. FINDINGS: Stable cardiomediastinal silhouette status post CABG. Normal pulmonary vascularity. No focal consolidation, pleural effusion, or pneumothorax. No acute osseous abnormality. IMPRESSION: No active disease. Electronically Signed   By: Titus Dubin M.D.   On: 07/31/2018 06:14    Procedures .Critical Care Performed by: Ripley Fraise, MD Authorized by: Ripley Fraise, MD   Critical care provider statement:    Critical care time (minutes):  45   Critical care start time:  07/31/2018 6:15 AM   Critical care end time:  07/31/2018 7:00 AM   Critical care time was exclusive of:  Separately billable procedures and treating other patients   Critical care was necessary to treat or prevent imminent or life-threatening deterioration of the following conditions:  Cardiac failure and respiratory failure   Critical care was time spent personally by me on the following activities:  Evaluation of patient's response to  treatment, examination of patient, discussions with consultants, ordering and review of radiographic studies, ordering and review of laboratory studies, pulse oximetry, re-evaluation of patient's condition and review of old charts    Medications Ordered in ED Medications  nitroGLYCERIN (NITROGLYN) 2 % ointment 1  inch (1 inch Topical Given 07/31/18 0620)     Initial Impression / Assessment and Plan / ED Course  I have reviewed the triage vital signs and the nursing notes.  Pertinent labs & imaging results that were available during my care of the patient were reviewed by me and considered in my medical decision making (see chart for details).     HEAR Score: 4  6:09 AM Patient with extensive cardiac history, had cardiac cath in April 2020 revealing extensive CAD, patient had a non-STEMI at that time He is improved with nitroglycerin. However he is hypertensive, will place on nitro patch.  He has already received aspirin 7:15 AM Patient without any acute EKG changes. Troponin is mildly elevated. I discussed the case with Dr. Caryl Comes with cardiology.  Cardiology will see the patient. Patient is feeling improved, chest pain is nearly resolved.  His blood pressure is elevated, Nitropaste has been ordered.  He has not taken aspirin He also reports his gums have been bleeding.  Final Clinical Impressions(s) / ED Diagnoses   Final diagnoses:  Non-STEMI (non-ST elevated myocardial infarction) [facility])    ED Discharge Orders    None       Ripley Fraise, MD 07/31/18 [phone]

## 2018-07-31 NOTE — Plan of Care (Signed)
  Problem: Pain Managment: Goal: General experience of comfort will improve Outcome: Progressing   Problem: Safety: Goal: Ability to remain free from injury will improve Outcome: Completed/Met

## 2018-07-31 NOTE — H&P (Addendum)
Date: 07/31/2018               Patient Name:  Darrell [name] MRN: [id number]  DOB: 10-11-1955 Age / Sex: 63 y.o., male   PCP: [name], MD         Medical Service: Internal Medicine Teaching Service         Attending Physician: Dr. Bartholomew Crews, MD    First Contact: Dr. Court Joy Pager: [phone]  Second Contact: Dr. Trilby Drummer Pager: [phone]       After Hours (After 5p/  First Contact Pager: [phone]  weekends / holidays): Second Contact Pager: [phone]   Chief Complaint: Chest Pain  History of Present Illness:   Darrell [name] is a 63 year old M with HLD, HTN, DM2 , CAD s/p CABG 2009 and multiple interventions prevents with chest pain to ED. He was in a hotel this morning and woke up with pain from poor dentition. He also noticed chest pain at this time and his wife called EMS. He describes the pain as substernal pain similar to his pain with his MI. He said his pain was relieved with nitropaste placed in ED. He had not taken any of his medications this morning.   Cardiology consulted in ED. No acute EKG changes and mild elevated troponin. Systolic BP 062'B in ED.    Meds:  Current Meds  Medication Sig  . amLODipine (NORVASC) 10 MG tablet Take 1 tablet (10 mg total) by mouth daily.  Marland Kitchen aspirin 81 MG EC tablet Take 1 tablet (81 mg total) by mouth daily.  Marland Kitchen atorvastatin (LIPITOR) 80 MG tablet Take 1 tablet (80 mg total) by mouth every evening.  . carvedilol (COREG) 25 MG tablet Take 1 tablet (25 mg total) by mouth 2 (two) times daily with a meal.  . clopidogrel (PLAVIX) 75 MG tablet Take 1 tablet (75 mg total) by mouth daily.  . isosorbide mononitrate (IMDUR) 120 MG 24 hr tablet Take 1 tablet (120 mg total) by mouth daily.  . metFORMIN (GLUCOPHAGE) 500 MG tablet Take 1 tablet (500 mg total) by mouth 2 (two) times daily with a meal.     Allergies: Allergies as of 07/31/2018  . (No Known Allergies)   Past Medical History:  Diagnosis Date  . CKD (chronic kidney  disease), stage III ([place])   . Claudication ([place])   . Coronary artery disease    a. 08/2001 s/p DES to RCA/RI;  b. 07/2006 s/p DES to p/d LCX;  c. 11/2007 CABGx5 (LIMA->LAD, VG->D1, LRA->OM1, VG->PDA->LPL); c. 03/2015: Synergy DES to mid-Cx in the setting of NSTEMI; d. 11/2015 NSTEMI->Med Rx. e. NSTEMI 01/2018 -> med rx.  . Diabetes mellitus type 2 in nonobese ([place])    a. A1c 7.0 09/2012.  . Diastolic dysfunction    a. 02/2016 Echo: EF 50-55%, gr1DD, Ao sclerosis, dil Ao root (72mm), sev dil LA, triv TR, PASP 57mmHg.  Marland Kitchen Hyperlipidemia   . Hypertension   . Noncompliance   . Right carotid bruit    a. noted 01/2018, will need OP eval when patient complies with follow-up.    Family History:  Family History  Problem Relation Age of Onset  . Heart failure Mother   . Cancer Mother   . CAD Mother 34  . Heart failure Father   . Kidney failure Brother      Social History:  Social History   Tobacco Use  . Smoking status: Former Smoker    Quit date: 10/25/2010  Years since quitting: 7.7  . Smokeless tobacco: Never Used  Substance Use Topics  . Alcohol use: No    Comment: sts he drank for the first time in a long time last night   . Drug use: No    Review of Systems: A complete ROS was negative except as per HPI.  Physical Exam: Blood pressure (!) 165/94, pulse 90, temperature 98.7 F (37.1 C), temperature source Oral, resp. rate 18, height 6\' 1"  (1.854 m), weight 100.5 kg, SpO2 100 %.  Physical Exam Constitutional:      Appearance: He is well-developed.  Cardiovascular:     Rate and Rhythm: Normal rate and regular rhythm.     Heart sounds: Normal heart sounds. No systolic murmur.  Pulmonary:     Effort: Pulmonary effort is normal. No respiratory distress.     Breath sounds: Normal breath sounds.  Abdominal:     General: Bowel sounds are normal.     Palpations: Abdomen is soft.  Musculoskeletal:     Right lower leg: No edema.     Left lower leg: No edema.  Skin:     General: Skin is warm and dry.  Neurological:     General: No focal deficit present.     Mental Status: He is alert and oriented to person, place, and time.    Assessment/Plan:  Active Problems:   Chest pain, rule out acute myocardial infarction  Darrell HomansSteven [name] is a 63 year old M with HLD, HTN, DM2 , CAD s/p CABG 2009 and multiple interventions prevents with likely demand ischemia in setting of hypertensive urgency.  Chest pain Patient presents with chest pain likely brought on by demand ischemia in setting of hypertension and pain related to poor dentition. Pain went away with nitropaste. Admitted for observation in setting of hypertension and extensive CAD. High sensitivity troponin 20>14 ng/L. EKG with no new changes.  - restart home amlodipine - monitor blood pressure after giving home meds - PRN nitroglycerin   CAD History as above - C/w home 81mg  asprin, coreg , imdur , plavix  HLD - C/w home statin  DM2 - C/w home metformin   Diet: Heart Healthy  Code: Full DVT PPx : Heparin   Dispo: Anticipated discharge in approximately 1-2 day(s).   Darrell FairJeff Kamiya Acord, MD PGY1  [phone]

## 2018-08-01 DIAGNOSIS — I252 Old myocardial infarction: Secondary | ICD-10-CM | POA: Diagnosis not present

## 2018-08-01 DIAGNOSIS — E785 Hyperlipidemia, unspecified: Secondary | ICD-10-CM

## 2018-08-01 DIAGNOSIS — I10 Essential (primary) hypertension: Secondary | ICD-10-CM

## 2018-08-01 DIAGNOSIS — R079 Chest pain, unspecified: Secondary | ICD-10-CM | POA: Diagnosis not present

## 2018-08-01 DIAGNOSIS — Z7982 Long term (current) use of aspirin: Secondary | ICD-10-CM | POA: Diagnosis not present

## 2018-08-01 DIAGNOSIS — K0889 Other specified disorders of teeth and supporting structures: Secondary | ICD-10-CM

## 2018-08-01 DIAGNOSIS — Z79899 Other long term (current) drug therapy: Secondary | ICD-10-CM

## 2018-08-01 DIAGNOSIS — K068 Other specified disorders of gingiva and edentulous alveolar ridge: Secondary | ICD-10-CM

## 2018-08-01 DIAGNOSIS — Z7984 Long term (current) use of oral hypoglycemic drugs: Secondary | ICD-10-CM | POA: Diagnosis not present

## 2018-08-01 DIAGNOSIS — Z20828 Contact with and (suspected) exposure to other viral communicable diseases: Secondary | ICD-10-CM | POA: Diagnosis not present

## 2018-08-01 DIAGNOSIS — I2510 Atherosclerotic heart disease of native coronary artery without angina pectoris: Secondary | ICD-10-CM

## 2018-08-01 DIAGNOSIS — E119 Type 2 diabetes mellitus without complications: Secondary | ICD-10-CM

## 2018-08-01 DIAGNOSIS — R0789 Other chest pain: Secondary | ICD-10-CM | POA: Diagnosis not present

## 2018-08-01 DIAGNOSIS — N183 Chronic kidney disease, stage 3 (moderate): Secondary | ICD-10-CM | POA: Diagnosis not present

## 2018-08-01 DIAGNOSIS — Z951 Presence of aortocoronary bypass graft: Secondary | ICD-10-CM | POA: Diagnosis not present

## 2018-08-01 DIAGNOSIS — Z7901 Long term (current) use of anticoagulants: Secondary | ICD-10-CM | POA: Diagnosis not present

## 2018-08-01 DIAGNOSIS — Z9861 Coronary angioplasty status: Secondary | ICD-10-CM

## 2018-08-01 DIAGNOSIS — I129 Hypertensive chronic kidney disease with stage 1 through stage 4 chronic kidney disease, or unspecified chronic kidney disease: Secondary | ICD-10-CM | POA: Diagnosis not present

## 2018-08-01 DIAGNOSIS — Z87891 Personal history of nicotine dependence: Secondary | ICD-10-CM | POA: Diagnosis not present

## 2018-08-01 LAB — LIPID PANEL
Cholesterol: 127 mg/dL (ref 0–200)
HDL: 36 mg/dL — ABNORMAL LOW (ref >40)
LDL Cholesterol: 78 mg/dL (ref 0–99)
Total CHOL/HDL Ratio: 3.5 RATIO
Triglycerides: 66 mg/dL (ref <150)
VLDL: 13 mg/dL (ref 0–40)

## 2018-08-01 LAB — NOVEL CORONAVIRUS, NAA (HOSP ORDER, SEND-OUT TO REF LAB; TAT 18-24 HRS): SARS-CoV-2, NAA: NOT DETECTED

## 2018-08-01 MED ORDER — HYDRALAZINE HCL 10 MG PO TABS: 10.0000 mg | ORAL_TABLET | Freq: Three times a day (TID) | ORAL | Status: DC

## 2018-08-01 NOTE — Progress Notes (Signed)
   Subjective:   Patient seen on morning rounds. He was resting comfortably in his bed and denies chest pain. He is feeling well today and is ready to go home. He has his medications and states he has no difficulty affording them. He will follow up with his PCP in person this week. He states his son can give him a ride. No further questions.  Objective:  Vital signs in last 24 hours: Vitals:   07/31/18 1700 07/31/18 2036 08/01/18 0553 08/01/18 0849  BP: (!) 165/94 (!) 172/106 (!) 171/96 (!) 162/89  Pulse:  80 73   Resp:      Temp:  98.7 F (37.1 C) 98.3 F (36.8 C)   TempSrc:  Oral Oral   SpO2:  99% 100%   Weight:   100.2 kg   Height:       Physical Exam Constitutional:      General: He is not in acute distress.    Appearance: Normal appearance.  HENT:     Head:     Comments: Poor dentition noted, no active bleeding when examined Cardiovascular:     Rate and Rhythm: Normal rate and regular rhythm.     Pulses: Normal pulses.     Heart sounds: Normal heart sounds.  Pulmonary:     Effort: Pulmonary effort is normal. No respiratory distress.     Breath sounds: Normal breath sounds.  Abdominal:     General: Bowel sounds are normal. There is no distension.     Palpations: Abdomen is soft.     Tenderness: There is no abdominal tenderness.  Musculoskeletal:        General: No swelling or deformity.  Skin:    General: Skin is warm and dry.  Neurological:     General: No focal deficit present.     Mental Status: Mental status is at baseline.    Assessment/Plan:  Active Problems:   Chest pain, rule out acute myocardial infarction  Darrell [name] is a 63 year old M with HLD, HTN, DM2 , CAD s/p CABG 2009 and multiple interventions prevents with likely demand ischemia in setting of likely hypertensive urgency.  Chest pain Patient presents with chest pain likely 2/2 demand ischemia in setting of significant hypertension and dental pain. High sensitivity troponin 20>14 ng/L  in ED. EKG with possible new Q in V3, but otherwise no new changes.  - Continue home amlodipine, coreg , imdur - PRN nitroglycerin   HLD  CAD s/p CABG 2009 and multiple interventions including balloon atherectomy in april 2020 for in stent stenosis. - Home asprin, coreg , imdur , Plavix, statin  DM2: home metformin   Diet: Heart Healthy  Code: Full DVT PPx : Heparin   Dispo: Anticipated discharge in approximately Today Pearson Grippe, DO IM PGY-3

## 2018-08-01 NOTE — Progress Notes (Signed)
Pt noted to have gum bleeding. Pt stated that he's been having this gum bleeding even at home. MD on call notified. Will continue to monitor pt.

## 2018-08-01 NOTE — Discharge Summary (Signed)
Name: Darrell [name] MRN: [id number] DOB: 1955/10/02 63 y.o. PCP: [name], MD  Date of Admission: 07/31/2018  5:34 AM Date of Discharge: 08/01/2018 Attending Physician: Bartholomew Crews, MD  Discharge Diagnosis: Chest Pain, rule out ACS  Discharge Medications: Allergies as of 08/01/2018   No Known Allergies     Medication List    TAKE these medications   accu-chek softclix lancets Use as instructed daily.   amLODipine 10 MG tablet Commonly known as: NORVASC Take 1 tablet (10 mg total) by mouth daily.   aspirin 81 MG EC tablet Take 1 tablet (81 mg total) by mouth daily.   atorvastatin 80 MG tablet Commonly known as: LIPITOR Take 1 tablet (80 mg total) by mouth every evening.   carvedilol 25 MG tablet Commonly known as: COREG Take 1 tablet (25 mg total) by mouth 2 (two) times daily with a meal.   clopidogrel 75 MG tablet Commonly known as: PLAVIX Take 1 tablet (75 mg total) by mouth daily.   glucose blood test strip Commonly known as: True Metrix Blood Glucose Test 1 each by Other route 3 (three) times daily.   isosorbide mononitrate 120 MG 24 hr tablet Commonly known as: IMDUR Take 1 tablet (120 mg total) by mouth daily.   metFORMIN 500 MG tablet Commonly known as: GLUCOPHAGE Take 1 tablet (500 mg total) by mouth 2 (two) times daily with a meal.   nitroGLYCERIN 0.4 MG SL tablet Commonly known as: NITROSTAT PLACE 1 TABLET (0.4 MG TOTAL) UNDER THE TONGUE EVERY 5 (FIVE) MINUTES AS NEEDED FOR CHEST PAIN (UP TO 3 DOSES. IF TAKING 3RD DOSE CALL 911)   True Metrix Meter w/Device Kit USE AS INSTRUCTED   TRUEplus Lancets 28G Misc USE AS INSTRUCTED       Disposition and follow-up:   Darrell [name] was discharged from [facility] in Stable condition.  At the hospital follow up visit please address:  Chest Pain, rule out ACS - Pain in the setting of uncontrolled HTN, adjust medications as needed - Ensure cardiology follow-up  given extensive cardiac history - High-Sensitivity Troponin: 20 >> 14 - EKG with possible new Q-wave in lead V3, otherwise unchanged  Bleeding Gums - Ongoing for almost 1 yr per patient - Improved during admission - Would benefit from dental referral if possible  2.  Labs / imaging needed at time of follow-up: None  3.  Pending labs/ test needing follow-up: None  Follow-up Appointments: Follow-up Information    [name], MD. Call.   Specialty: Family Medicine Why: Call in 2 days to schedule an appointment for a day in the next 1-2 weeks Contact information: [place] [address] [phone]        Minus Breeding, MD .   Specialty: Cardiology Contact information: [address] [place] [address] [phone]           Hospital Course by problem list:  Chest Pain, rule out ACS Patient presented with chest pain starting when woke up. His wife contacted EMS due to the pain being similar to his previous MI. In the ED BP noted to be in the 200s (possible contributing to his pain via cardiac demand). High sensitivity troponin's were 20 and 14 and EKG was without new ischemic changes. He was admitted for observation on the advise of cardiology by phone (likley due to his extensive history). No changes on EKG in AM and he remained chest pain free with BP  improved to 812X systolic on discharge. He was continued on all home medications, which he stated he did not have trouble accessing or affording. He has been instructed to schedule and in-person follow up with PCP's office in the next week.  Bleeding gums Chronic for about a year per patient. Intermittent without sign of infection. Bleeding improved throughout observation, possibly due to improvement in BP. He is to speak with PCP about dental referral options.   Discharge Vitals:   BP (!) 161/87 (BP Location: Left Wrist)   Pulse 72   Temp 99.4 F (37.4 C) (Oral)   Resp 18   Ht  _0  (1.854 m)   Wt 100.2 kg   SpO2 100%   BMI 29.14 kg/m   Pertinent Labs, Studies, and Procedures:  CBC Latest Ref Rng & Units 07/31/2018 05/06/2018 05/05/2018  WBC 4.0 - 10.5 K/uL 7.2 5.6 5.0  Hemoglobin 13.0 - 17.0 g/dL 13.2 10.7(L) 11.4(L)  Hematocrit 39.0 - 52.0 % 40.7 33.6(L) 35.8(L)  Platelets 150 - 400 K/uL 205 151 166   BMP Latest Ref Rng & Units 07/31/2018 05/06/2018 05/05/2018  Glucose 70 - 99 mg/dL 127(H) 256(H) 135(H)  BUN 8 - 23 mg/dL 26(H) 23 25(H)  Creatinine 0.61 - 1.24 mg/dL 1.57(H) 1.43(H) 1.64(H)  Sodium 135 - 145 mmol/L 134(L) 135 136  Potassium 3.5 - 5.1 mmol/L 4.4 4.2 4.1  Chloride 98 - 111 mmol/L 101 106 106  CO2 22 - 32 mmol/L 23 21(L) 21(L)  Calcium 8.9 - 10.3 mg/dL 9.0 8.4(L) 8.3(L)   High-Sensitivity Troponin: 20 >> 14   EKG Interpretation  Date/Time:  Friday July 31 2018 05:39:38 EDT Ventricular Rate:  94 PR Interval:    QRS Duration: 113 QT Interval:  386 QTC Calculation: 483 R Axis:   88 Text Interpretation:  Sinus rhythm Prolonged PR interval Consider left atrial enlargement Anterior infarct, old Abnormal ekg Confirmed by Ripley Fraise [phone]) on 07/31/2018 5:43:16 AM      CXR Port 1-view: IMPRESSION: No active disease.   Discharge Instructions: Discharge Instructions    Call MD for:  difficulty breathing, headache or visual disturbances   Complete by: As directed    Call MD for:  persistant dizziness or light-headedness   Complete by: As directed    Call MD for:  persistant nausea and vomiting   Complete by: As directed    Call MD for:  severe uncontrolled pain   Complete by: As directed    Diet - low sodium heart healthy   Complete by: As directed    Discharge instructions   Complete by: As directed    Thank you for allowing [place] to care for you  Your symptoms are believed to have been caused by your elevated blood pressure - The lab tests showed that the source of your pain was unlikely to be heart damage - Continue to take home  blood pressure medications as prescribed  Please call your PCP office on Monday and arrange an appointment with them in the next week for medication adjustment and referral to a dentist if possible   Increase activity slowly   Complete by: As directed       Signed: Neva Seat, MD 08/01/2018, 2:09 PM   Pager: [phone]

## 2018-08-01 NOTE — Progress Notes (Signed)
  Date: 08/01/2018  Patient name: Darrell [name]  Medical record number: [id number]  Date of birth: 1955/09/08   I have seen and evaluated this patient and I have discussed the plan of care with the house staff. Please see their note for complete details. I concur with their findings with the following additions/corrections: Mr. Kane was seen this morning on team.  He was lying in bed and denied any chest pain or further down bleeding.  He has no difficulty getting to his PCP at the [facility] and would like to continue receiving care there.  24-hour T-max 99.4 Blood pressure 161/87, heart rate 72, O2 sat 100% on room air HRRR No MRG, no lower extremity edema LCTAB with good airflow  I personally viewed the CXR images and confirmed my reading with the official read. 1 view AP portable semierect, sternal wires, left costophrenic angle not included.  No overt abnormalities.  I personally viewed the EKG and confirmed my reading with the official read.  Sinus, normal axis, questionable LAD, no ischemic changes  Pertinent labs Creatinine 1.57, baseline variable but between 1.3 and 1.5 recently Albumin 2.9 High-sensitivity troponin 20 - 14 LDL 78, HDL 36, total cholesterol 127, triglycerides 66 A1c 6.2 in January  Assessment and plan 1.  Chest pain with known CAD, status post CABG - he ruled out for ACS by EKGs and troponin.  He is already on aspirin, high intensity statin, and a beta-blocker which we will continue at discharge.  He can follow-up with his PCP.  2.  Diabetes not insulin-dependent and well controlled - cont his home metformin  He is stable to be discharged to home  Bartholomew Crews, MD 08/01/2018, 3:20 PM

## 2018-08-03 ENCOUNTER — Telehealth: Payer: Self-pay

## 2018-08-03 NOTE — Telephone Encounter (Signed)
Patient name and DOB has been verified Patient was informed of lab results. Patient had no questions.  

## 2018-08-03 NOTE — Telephone Encounter (Signed)
-----   Message from [name], MD sent at 08/03/2018  8:17 AM EDT ----- Covid-19 test performed during hospitalization came back negative

## 2018-08-07 ENCOUNTER — Encounter (HOSPITAL_COMMUNITY): Payer: Self-pay | Admitting: Emergency Medicine

## 2018-08-07 ENCOUNTER — Other Ambulatory Visit: Payer: Self-pay

## 2018-08-07 ENCOUNTER — Emergency Department (HOSPITAL_COMMUNITY)
Admission: EM | Admit: 2018-08-07 | Discharge: 2018-08-07 | Disposition: A | Discharge status: Home / Self Care | Payer: Medicare Other | Attending: Emergency Medicine | Admitting: Emergency Medicine

## 2018-08-07 ENCOUNTER — Emergency Department (HOSPITAL_COMMUNITY): Payer: Medicare Other

## 2018-08-07 DIAGNOSIS — R0789 Other chest pain: Secondary | ICD-10-CM | POA: Diagnosis not present

## 2018-08-07 DIAGNOSIS — I2510 Atherosclerotic heart disease of native coronary artery without angina pectoris: Secondary | ICD-10-CM | POA: Diagnosis not present

## 2018-08-07 DIAGNOSIS — Z951 Presence of aortocoronary bypass graft: Secondary | ICD-10-CM | POA: Diagnosis not present

## 2018-08-07 DIAGNOSIS — Z79899 Other long term (current) drug therapy: Secondary | ICD-10-CM | POA: Insufficient documentation

## 2018-08-07 DIAGNOSIS — I517 Cardiomegaly: Secondary | ICD-10-CM | POA: Diagnosis not present

## 2018-08-07 DIAGNOSIS — Z7984 Long term (current) use of oral hypoglycemic drugs: Secondary | ICD-10-CM | POA: Diagnosis not present

## 2018-08-07 DIAGNOSIS — I10 Essential (primary) hypertension: Secondary | ICD-10-CM | POA: Diagnosis not present

## 2018-08-07 DIAGNOSIS — R58 Hemorrhage, not elsewhere classified: Secondary | ICD-10-CM | POA: Diagnosis not present

## 2018-08-07 DIAGNOSIS — K068 Other specified disorders of gingiva and edentulous alveolar ridge: Secondary | ICD-10-CM | POA: Diagnosis not present

## 2018-08-07 DIAGNOSIS — K029 Dental caries, unspecified: Secondary | ICD-10-CM | POA: Diagnosis not present

## 2018-08-07 DIAGNOSIS — I129 Hypertensive chronic kidney disease with stage 1 through stage 4 chronic kidney disease, or unspecified chronic kidney disease: Secondary | ICD-10-CM | POA: Diagnosis not present

## 2018-08-07 DIAGNOSIS — R079 Chest pain, unspecified: Secondary | ICD-10-CM | POA: Diagnosis not present

## 2018-08-07 DIAGNOSIS — Z87891 Personal history of nicotine dependence: Secondary | ICD-10-CM | POA: Insufficient documentation

## 2018-08-07 DIAGNOSIS — N183 Chronic kidney disease, stage 3 (moderate): Secondary | ICD-10-CM | POA: Insufficient documentation

## 2018-08-07 DIAGNOSIS — E1122 Type 2 diabetes mellitus with diabetic chronic kidney disease: Secondary | ICD-10-CM | POA: Diagnosis not present

## 2018-08-07 DIAGNOSIS — Z7902 Long term (current) use of antithrombotics/antiplatelets: Secondary | ICD-10-CM | POA: Insufficient documentation

## 2018-08-07 LAB — CBC
HCT: 40 % (ref 39.0–52.0)
Hemoglobin: 13.1 g/dL (ref 13.0–17.0)
MCH: 28.9 pg (ref 26.0–34.0)
MCHC: 32.8 g/dL (ref 30.0–36.0)
MCV: 88.1 fL (ref 80.0–100.0)
Platelets: 204 10*3/uL (ref 150–400)
RBC: 4.54 MIL/uL (ref 4.22–5.81)
RDW: 13.7 % (ref 11.5–15.5)
WBC: 6.6 10*3/uL (ref 4.0–10.5)
nRBC: 0 % (ref 0.0–0.2)

## 2018-08-07 LAB — BASIC METABOLIC PANEL
Anion gap: 8 (ref 5–15)
BUN: 17 mg/dL (ref 8–23)
CO2: 24 mmol/L (ref 22–32)
Calcium: 8.9 mg/dL (ref 8.9–10.3)
Chloride: 105 mmol/L (ref 98–111)
Creatinine, Ser: 1.41 mg/dL — ABNORMAL HIGH (ref 0.61–1.24)
GFR calc Af Amer: >60 mL/min (ref >60)
GFR calc non Af Amer: 53 mL/min — ABNORMAL LOW (ref >60)
Glucose, Bld: 120 mg/dL — ABNORMAL HIGH (ref 70–99)
Potassium: 4.4 mmol/L (ref 3.5–5.1)
Sodium: 137 mmol/L (ref 135–145)

## 2018-08-07 LAB — TROPONIN I (HIGH SENSITIVITY)
Troponin I (High Sensitivity): 12 ng/L (ref <18)
Troponin I (High Sensitivity): 10 ng/L (ref <18)

## 2018-08-07 MED ORDER — SODIUM CHLORIDE 0.9% FLUSH
3.0000 mL | Freq: Once | INTRAVENOUS | Status: AC
  Administered 2018-08-07: 3 mL via INTRAVENOUS

## 2018-08-07 MED ORDER — AMOXICILLIN 500 MG PO CAPS: 500.0000 mg | ORAL_CAPSULE | Freq: Three times a day (TID) | ORAL | 0 refills | Status: DC

## 2018-08-07 MED ORDER — METOPROLOL TARTRATE 5 MG/5ML IV SOLN
5.0000 mg | Freq: Once | INTRAVENOUS | Status: AC
  Administered 2018-08-07: 5 mg via INTRAVENOUS
  Filled 2018-08-07: qty 5

## 2018-08-07 MED ORDER — CHLORHEXIDINE GLUCONATE 0.12 % MT SOLN: 15.0000 mL | Freq: Two times a day (BID) | OROMUCOSAL | 0 refills | Status: DC

## 2018-08-07 MED FILL — AMOXICILLIN 500 MG CAPSULE: 500 | 7 days supply | Qty: 21 | Fill #0

## 2018-08-07 MED FILL — CHLORHEXIDINE 0.12% RINSE: 0.12 | 15 days supply | Qty: 473 | Fill #0

## 2018-08-07 NOTE — ED Provider Notes (Signed)
[facility] DEPART[facility] Provider Note   CSN: [id number] Arrival date & time: 08/07/18  7867    History   Chief Complaint Chief Complaint  Patient presents with  . Chest Pain    HPI Darrell [name] is a 63 y.o. male.     Pt presents to the ED today with chest pain and bleeding gums.  Pt was admitted from 7/3-4 for cp and was ruled out for a MI.  He woke up this morning with bleeding gums.  He has a hx of CAD and is on asa and plavix.  He was on heparin for dvt prophylaxis in the hospital.  The pt has had bleeding gums in the past.  He has not been to his dentist in many years.  Pt has not taken any of his meds this morning.  Pt said cp is gone now.  No sob or n/v.     Past Medical History:  Diagnosis Date  . CKD (chronic kidney disease), stage III ([place])   . Claudication ([place])   . Coronary artery disease    a. 08/2001 s/p DES to RCA/RI;  b. 07/2006 s/p DES to p/d LCX;  c. 11/2007 CABGx5 (LIMA->LAD, VG->D1, LRA->OM1, VG->PDA->LPL); c. 03/2015: Synergy DES to mid-Cx in the setting of NSTEMI; d. 11/2015 NSTEMI->Med Rx. e. NSTEMI 01/2018 -> med rx.  . Diabetes mellitus type 2 in nonobese ([place])    a. A1c 7.0 09/2012.  . Diastolic dysfunction    a. 02/2016 Echo: EF 50-55%, gr1DD, Ao sclerosis, dil Ao root (42m), sev dil LA, triv TR, PASP 24mg.  . Marland Kitchenyperlipidemia   . Hypertension   . Noncompliance   . Right carotid bruit    a. noted 01/2018, will need OP eval when patient complies with follow-up.    Patient Active Problem List   Diagnosis Date Noted  . Bleeding gums 08/01/2018  . Chest pain, rule out acute myocardial infarction 07/31/2018  . Renal insufficiency   . ACS (acute coronary syndrome) ([facility]04/05/2018  . Mild anemia 02/08/2018  . CKD (chronic kidney disease), stage III ([facility]  . Diabetes mellitus type 2 in nonobese (H[facility])   . Hyperlipidemia with target LDL less than 70   . Hypertension   . Right carotid bruit   . Sepsis ([facility]02/03/2016   . Chest pain   . Hyponatremia 01/03/2016  . Hypertensive emergency   . NSTEMI (non-ST elevated myocardial infarction) ([facility]03/07/2015  . HTN (hypertension) 04/04/2015  . Hypertensive urgency 03/25/2014  . History of noncompliance with medical treatment, presenting hazards to health 03/25/2014  . Claudication ([facility]10/01/2012  . GERD 10/23/2007  . NEPHROLITHIASIS 10/23/2007  . HYPERCHOLESTEROLEMIA 10/22/2007  . ADJUSTMENT DISORDER WITH DEPRESSED MOOD 10/22/2007  . Type 2 diabetes mellitus without complication, without long-term current use of insulin ([facility]CS[facility]01/01/2003  . Coronary atherosclerosis, s/p CABG in 2009  01/28/2001    Past Surgical History:  Procedure Laterality Date  . CARDIAC CATHETERIZATION N/A 04/06/2015   Procedure: Left Heart Cath and Cors/Grafts Angiography;  Surgeon: JoLorretta HarpMD;  [facility];  Service: Cardiovascular;  Laterality: N/A;  . CARDIAC CATHETERIZATION N/A 04/06/2015   Procedure: Coronary Stent Intervention;  Surgeon: JoLorretta HarpMD;  2.5 mm x 12 mm long Synergy DES followed by  2.5 mm x 16 mm long Synergy DES    . CORONARY ARTERY BYPASS GRAFT     2009 LIMA to LAD, SVG to Diag, SVG to PDA and PL,  left radial to OM  . CORONARY BALLOON ANGIOPLASTY N/A 05/04/2018   Procedure: CORONARY BALLOON ANGIOPLASTY;  Surgeon: Lorretta Harp, MD;  Location: [facility];  Service: Cardiovascular;  Laterality: N/A;  Prox CFX  . LEFT HEART CATH N/A 10/24/2012   Procedure: LEFT HEART CATH;  Surgeon: Troy Sine, MD;  [facility];  Service: Cardiovascular;  Laterality: N/A;  . LEFT HEART CATH AND CORS/GRAFTS ANGIOGRAPHY N/A 05/04/2018   Procedure: LEFT HEART CATH AND CORS/GRAFTS ANGIOGRAPHY;  Surgeon: Lorretta Harp, MD;  Location: [facility];  Service: Cardiovascular;  Laterality: N/A;        Home Medications    Prior to Admission medications   Medication Sig Start Date End Date Taking? Authorizing Provider   amLODipine (NORVASC) 10 MG tablet Take 1 tablet (10 mg total) by mouth daily. 03/12/18   [name], MD  amoxicillin (AMOXIL) 500 MG capsule Take 1 capsule (500 mg total) by mouth 3 (three) times daily. 08/07/18   Isla Pence, MD  aspirin 81 MG EC tablet Take 1 tablet (81 mg total) by mouth daily. 03/12/18   [name], MD  atorvastatin (LIPITOR) 80 MG tablet Take 1 tablet (80 mg total) by mouth every evening. 03/12/18   [name], MD  Blood Glucose Monitoring Suppl (TRUE METRIX METER) w/Device KIT USE AS INSTRUCTED 07/05/16   [name], MD  carvedilol (COREG) 25 MG tablet Take 1 tablet (25 mg total) by mouth 2 (two) times daily with a meal. 03/12/18   [name], MD  chlorhexidine (PERIDEX) 0.12 % solution Use as directed 15 mLs in the mouth or throat 2 (two) times daily. 08/07/18   Isla Pence, MD  clopidogrel (PLAVIX) 75 MG tablet Take 1 tablet (75 mg total) by mouth daily. 03/12/18   [name], MD  glucose blood (TRUE METRIX BLOOD GLUCOSE TEST) test strip 1 each by Other route 3 (three) times daily. 03/12/18   [name], MD  isosorbide mononitrate (IMDUR) 120 MG 24 hr tablet Take 1 tablet (120 mg total) by mouth daily. 03/12/18   [name], MD  Lancet Devices [facility]) lancets Use as instructed daily. 07/05/16   [name], MD  metFORMIN (GLUCOPHAGE) 500 MG tablet Take 1 tablet (500 mg total) by mouth 2 (two) times daily with a meal. 03/12/18   Newlin, Enobong, MD  nitroGLYCERIN (NITROSTAT) 0.4 MG SL tablet PLACE 1 TABLET (0.4 MG TOTAL) UNDER THE TONGUE EVERY 5 (FIVE) MINUTES AS NEEDED FOR CHEST PAIN (UP TO 3 DOSES. IF TAKING 3RD DOSE CALL 911) 07/16/18   [name], MD  TRUEPLUS LANCETS 28G MISC USE AS INSTRUCTED 07/05/16   [name], MD    Family History Family History  Problem Relation Age of Onset  . Heart failure Mother   . Cancer Mother   . CAD Mother 62  . Heart failure Father   . Kidney failure Brother     Social  History Social History   Tobacco Use  . Smoking status: Former Smoker    Quit date: 10/25/2010    Years since quitting: 7.7  . Smokeless tobacco: Never Used  Substance Use Topics  . Alcohol use: No    Comment: sts he drank for the first time in a long time last night   . Drug use: No     Allergies   Patient has no known allergies.   Review of Systems Review of Systems  HENT: Positive for dental problem.   Cardiovascular:  Positive for chest pain.  All other systems reviewed and are negative.    Physical Exam Updated Vital Signs BP (!) 192/106 (BP Location: Right Arm)   Pulse 80   Temp 98.6 F (37 C) (Oral)   Resp 16   SpO2 96%   Physical Exam Vitals signs and nursing note reviewed.  Constitutional:      Appearance: He is well-developed.  HENT:     Head: Normocephalic and atraumatic.     Comments: Bleeding gums.  Dental caries. Eyes:     Extraocular Movements: Extraocular movements intact.     Pupils: Pupils are equal, round, and reactive to light.  Neck:     Musculoskeletal: Normal range of motion and neck supple.  Cardiovascular:     Rate and Rhythm: Normal rate and regular rhythm.     Heart sounds: Normal heart sounds.  Pulmonary:     Effort: Pulmonary effort is normal.     Breath sounds: Normal breath sounds.  Abdominal:     General: Bowel sounds are normal.     Palpations: Abdomen is soft.  Musculoskeletal: Normal range of motion.  Skin:    General: Skin is warm and dry.     Capillary Refill: Capillary refill takes less than 2 seconds.  Neurological:     General: No focal deficit present.     Mental Status: He is alert and oriented to person, place, and time.  Psychiatric:        Mood and Affect: Mood normal.        Behavior: Behavior normal.      ED Treatments / Results  Labs (all labs ordered are listed, but only abnormal results are displayed) Labs Reviewed  BASIC METABOLIC PANEL - Abnormal; Notable for the following components:       Result Value   Glucose, Bld 120 (*)    Creatinine, Ser 1.41 (*)    GFR calc non Af Amer 53 (*)    All other components within normal limits  CBC  TROPONIN I (HIGH SENSITIVITY)  TROPONIN I (HIGH SENSITIVITY)    EKG EKG Interpretation  Date/Time:  Friday August 07 2018 06:30:59 EDT Ventricular Rate:  81 PR Interval:  176 QRS Duration: 114 QT Interval:  404 QTC Calculation: 469 R Axis:   67 Text Interpretation:  Normal sinus rhythm Possible Left atrial enlargement Anterior infarct , age undetermined Abnormal ECG No significant change since last tracing Confirmed by Isla Pence [phone]) on 08/07/2018 6:56:09 AM   Radiology No results found.  Procedures Procedures (including critical care time)  Medications Ordered in ED Medications  sodium chloride flush (NS) 0.9 % injection 3 mL (3 mLs Intravenous Given 08/07/18 0748)  metoprolol tartrate (LOPRESSOR) injection 5 mg (5 mg Intravenous Given 08/07/18 0749)     Initial Impression / Assessment and Plan / ED Course  I have reviewed the triage vital signs and the nursing notes.  Pertinent labs & imaging results that were available during my care of the patient were reviewed by me and considered in my medical decision making (see chart for details).        Chest pain is atypical.  He has had a recent admission with negative troponins.  Bleeding gums has been an intermittent issue.  It is from his poor oral health.  He was given chlorhexidine for his mouth.  He is encouraged to f/u with his dentist.   BP is slightly better.  He is told to take his meds when he gets home (  except for plavix).  Return if worse.  Final Clinical Impressions(s) / ED Diagnoses   Final diagnoses:  Atypical chest pain  Bleeding gums  Dental caries    ED Discharge Orders         Ordered    chlorhexidine (PERIDEX) 0.12 % solution  2 times daily     08/07/18 0943    amoxicillin (AMOXIL) 500 MG capsule  3 times daily     08/07/18 1798            Isla Pence, MD 08/10/18 [phone]

## 2018-08-07 NOTE — Discharge Instructions (Addendum)
Take your medications when you get home (except plavix and aspirin just for today due to the bleeding).

## 2018-08-07 NOTE — ED Triage Notes (Signed)
Patient with chest pain, substernal in nature.  Pain is gone at this time.  Patient having oral bleeding from multiple abscesses in mouth.  Patient is on Plavix.

## 2018-08-07 NOTE — ED Notes (Signed)
Attempting to call for a ride.

## 2018-08-07 NOTE — ED Notes (Addendum)
Called [employer] with [employer] and arranged ride for pt.  Family is aware that pt will be picked up from ed at 1300.

## 2018-09-01 MED FILL — ?CARVEDILOL 25 MG TABLET: 25 | 30 days supply | Qty: 60 | Fill #4

## 2018-09-01 MED FILL — ISOSORBIDE MN 120 MG TAB SA: 120 | 30 days supply | Qty: 30 | Fill #4

## 2018-09-01 MED FILL — CLOPIDOGREL 75 MG TABLET: 75 | 30 days supply | Qty: 30 | Fill #4

## 2018-09-01 MED FILL — metFORMIN HCL 500 MG TABS: 500 | 30 days supply | Qty: 60 | Fill #4

## 2018-09-01 MED FILL — ?AMLODIPINE BESYLATE 10 MG: 10 | 30 days supply | Qty: 30 | Fill #4

## 2018-09-01 MED FILL — NITROGLYCERIN 0.4 MG TAB SL: 0.4 | 25 days supply | Qty: 25 | Fill #0

## 2018-09-01 MED FILL — ?ATORVASTATIN 40MG TABLET: 40 | 30 days supply | Qty: 60 | Fill #2

## 2018-12-10 MED FILL — ?METFORMIN HCL 500MG TABLET: 500 | 30 days supply | Qty: 60 | Fill #5

## 2018-12-10 MED FILL — ?CARVEDILOL 25MG TABLE: 25 | 30 days supply | Qty: 60 | Fill #5

## 2018-12-10 MED FILL — NITROGLYCERIN 0.4 MG TAB SL: 0.4 | 25 days supply | Qty: 25 | Fill #1

## 2018-12-10 MED FILL — ?AMLODIPINE BESYLATE 10 MG: 10 | 30 days supply | Qty: 30 | Fill #5

## 2018-12-10 MED FILL — ISOSORBIDE MN 120 MG TAB SA: 120 | 30 days supply | Qty: 30 | Fill #5

## 2018-12-10 MED FILL — ?ATORVASTATIN 40MG TABLET: 40 | 30 days supply | Qty: 60 | Fill #3

## 2018-12-10 MED FILL — CLOPIDOGREL 75 MG TABLET: 75 | 30 days supply | Qty: 30 | Fill #5

## 2018-12-21 ENCOUNTER — Encounter (HOSPITAL_COMMUNITY): Payer: Self-pay | Admitting: Emergency Medicine

## 2018-12-21 ENCOUNTER — Other Ambulatory Visit: Payer: Self-pay

## 2018-12-21 ENCOUNTER — Emergency Department (HOSPITAL_COMMUNITY)
Admission: EM | Admit: 2018-12-21 | Discharge: 2018-12-22 | Disposition: A | Discharge status: Home / Self Care | Payer: Medicare Other | Attending: Emergency Medicine | Admitting: Emergency Medicine

## 2018-12-21 DIAGNOSIS — Z7982 Long term (current) use of aspirin: Secondary | ICD-10-CM | POA: Insufficient documentation

## 2018-12-21 DIAGNOSIS — E1122 Type 2 diabetes mellitus with diabetic chronic kidney disease: Secondary | ICD-10-CM | POA: Insufficient documentation

## 2018-12-21 DIAGNOSIS — R040 Epistaxis: Secondary | ICD-10-CM

## 2018-12-21 DIAGNOSIS — I129 Hypertensive chronic kidney disease with stage 1 through stage 4 chronic kidney disease, or unspecified chronic kidney disease: Secondary | ICD-10-CM | POA: Insufficient documentation

## 2018-12-21 DIAGNOSIS — Z87891 Personal history of nicotine dependence: Secondary | ICD-10-CM | POA: Insufficient documentation

## 2018-12-21 DIAGNOSIS — Z7984 Long term (current) use of oral hypoglycemic drugs: Secondary | ICD-10-CM | POA: Insufficient documentation

## 2018-12-21 DIAGNOSIS — R58 Hemorrhage, not elsewhere classified: Secondary | ICD-10-CM | POA: Diagnosis not present

## 2018-12-21 DIAGNOSIS — Z79899 Other long term (current) drug therapy: Secondary | ICD-10-CM | POA: Diagnosis not present

## 2018-12-21 DIAGNOSIS — I252 Old myocardial infarction: Secondary | ICD-10-CM | POA: Diagnosis not present

## 2018-12-21 DIAGNOSIS — I10 Essential (primary) hypertension: Secondary | ICD-10-CM | POA: Diagnosis not present

## 2018-12-21 DIAGNOSIS — N183 Chronic kidney disease, stage 3 unspecified: Secondary | ICD-10-CM | POA: Diagnosis not present

## 2018-12-21 DIAGNOSIS — Z7901 Long term (current) use of anticoagulants: Secondary | ICD-10-CM | POA: Insufficient documentation

## 2018-12-21 NOTE — ED Triage Notes (Signed)
Pt BIB [facility], c/o nose bleed that started tonight. Bleeding controlled at this time. Hx HTN, reports he has been taking his meds as prescribed. Denies headache/chest pain/shortness of breath.

## 2018-12-22 MED ORDER — OXYMETAZOLINE HCL 0.05 % NA SOLN
1.0000 | Freq: Once | NASAL | Status: AC
  Administered 2018-12-22: 07:00:00 1 via NASAL
  Filled 2018-12-22: qty 30

## 2018-12-22 MED ORDER — SILVER NITRATE-POT NITRATE 75-25 % EX MISC
3.0000 | Freq: Once | CUTANEOUS | Status: AC
  Administered 2018-12-22: 07:00:00 3 via TOPICAL
  Filled 2018-12-22: qty 3

## 2018-12-22 MED ORDER — LIDOCAINE-EPINEPHRINE-TETRACAINE (LET) TOPICAL GEL
3.0000 mL | Freq: Once | TOPICAL | Status: AC
  Administered 2018-12-22: 3 mL via TOPICAL
  Filled 2018-12-22: qty 3

## 2018-12-22 NOTE — ED Notes (Signed)
Discharge instructions discussed with pt. Pt verbalized understanding. Pt stable and ambulatory. No signature pad available. 

## 2018-12-22 NOTE — ED Provider Notes (Addendum)
[facility] EMERGENCY DEPARTMENT Provider Note   CSN: [id number] Arrival date & time: 12/21/18  2055     History   Chief Complaint Chief Complaint  Patient presents with  . Epistaxis    HPI Darrell [name] is a 63 y.o. male.     Patient presents to the emergency department for evaluation of nosebleed.  Patient reports that he has been bleeding from the right side of his nose off and on through the night.  He denies trauma.  He has had bleeds in the past, has never seen an ENT specialist.  He does take Plavix.  No dizziness, weakness, shortness of breath, heart palpitations, passing out.     Past Medical History:  Diagnosis Date  . CKD (chronic kidney disease), stage III   . Claudication ([place])   . Coronary artery disease    a. 08/2001 s/p DES to RCA/RI;  b. 07/2006 s/p DES to p/d LCX;  c. 11/2007 CABGx5 (LIMA->LAD, VG->D1, LRA->OM1, VG->PDA->LPL); c. 03/2015: Synergy DES to mid-Cx in the setting of NSTEMI; d. 11/2015 NSTEMI->Med Rx. e. NSTEMI 01/2018 -> med rx.  . Diabetes mellitus type 2 in nonobese ([place])    a. A1c 7.0 09/2012.  . Diastolic dysfunction    a. 02/2016 Echo: EF 50-55%, gr1DD, Ao sclerosis, dil Ao root (37m), sev dil LA, triv TR, PASP 271mg.  . Marland Kitchenyperlipidemia   . Hypertension   . Noncompliance   . Right carotid bruit    a. noted 01/2018, will need OP eval when patient complies with follow-up.    Patient Active Problem List   Diagnosis Date Noted  . Bleeding gums 08/01/2018  . Chest pain, rule out acute myocardial infarction 07/31/2018  . Renal insufficiency   . ACS (acute coronary syndrome) ([facility] [place]04/05/2018  . Mild anemia 02/08/2018  . CKD (chronic kidney disease), stage III   . Diabetes mellitus type 2 in nonobese (H[facility])   . Hyperlipidemia with target LDL less than 70   . Hypertension   . Right carotid bruit   . Sepsis ([facility]02/03/2016  . Chest pain   . Hyponatremia 01/03/2016  . Hypertensive emergency   . NSTEMI (non-ST elevated  myocardial infarction) ([facility]03/07/2015  . HTN (hypertension) 04/04/2015  . Hypertensive urgency 03/25/2014  . History of noncompliance with medical treatment, presenting hazards to health 03/25/2014  . Claudication ([facility]CLogan10/01/2012  . GERD 10/23/2007  . NEPHROLITHIASIS 10/23/2007  . HYPERCHOLESTEROLEMIA 10/22/2007  . ADJUSTMENT DISORDER WITH DEPRESSED MOOD 10/22/2007  . Type 2 diabetes mellitus without complication, without long-term current use of insulin ([facility] [place]01/01/2003  . Coronary atherosclerosis, s/p CABG in 2009  01/28/2001    Past Surgical History:  Procedure Laterality Date  . CARDIAC CATHETERIZATION N/A 04/06/2015   Procedure: Left Heart Cath and Cors/Grafts Angiography;  Surgeon: JoLorretta HarpMD;  [facility];  Service: Cardiovascular;  Laterality: N/A;  . CARDIAC CATHETERIZATION N/A 04/06/2015   Procedure: Coronary Stent Intervention;  Surgeon: JoLorretta HarpMD;  2.5 mm x 12 mm long Synergy DES followed by  2.5 mm x 16 mm long Synergy DES    . CORONARY ARTERY BYPASS GRAFT     2009 LIMA to LAD, SVG to Diag, SVG to PDA and PL, left radial to OM  . CORONARY BALLOON ANGIOPLASTY N/A 05/04/2018   Procedure: CORONARY BALLOON ANGIOPLASTY;  Surgeon: BeLorretta HarpMD;  [facility];  Service: Cardiovascular;  Laterality: N/A;  Prox CFX  . LEFT  HEART CATH N/A 10/24/2012   Procedure: LEFT HEART CATH;  Surgeon: Troy Sine, MD;  [facility];  Service: Cardiovascular;  Laterality: N/A;  . LEFT HEART CATH AND CORS/GRAFTS ANGIOGRAPHY N/A 05/04/2018   Procedure: LEFT HEART CATH AND CORS/GRAFTS ANGIOGRAPHY;  Surgeon: Lorretta Harp, MD;  Location: [facility] CV LAB;  Service: Cardiovascular;  Laterality: N/A;        Home Medications    Prior to Admission medications   Medication Sig Start Date End Date Taking? Authorizing Provider  amLODipine (NORVASC) 10 MG tablet Take 1 tablet (10 mg total) by mouth daily. 03/12/18   [name], MD  amoxicillin (AMOXIL) 500 MG capsule Take 1 capsule (500 mg total) by mouth 3 (three) times daily. 08/07/18   Isla Pence, MD  aspirin 81 MG EC tablet Take 1 tablet (81 mg total) by mouth daily. 03/12/18   [name], MD  atorvastatin (LIPITOR) 80 MG tablet Take 1 tablet (80 mg total) by mouth every evening. 03/12/18   [name], MD  Blood Glucose Monitoring Suppl (TRUE METRIX METER) w/Device KIT USE AS INSTRUCTED 07/05/16   [name], MD  carvedilol (COREG) 25 MG tablet Take 1 tablet (25 mg total) by mouth 2 (two) times daily with a meal. 03/12/18   [name], MD  chlorhexidine (PERIDEX) 0.12 % solution Use as directed 15 mLs in the mouth or throat 2 (two) times daily. 08/07/18   Isla Pence, MD  clopidogrel (PLAVIX) 75 MG tablet Take 1 tablet (75 mg total) by mouth daily. 03/12/18   [name], MD  glucose blood (TRUE METRIX BLOOD GLUCOSE TEST) test strip 1 each by Other route 3 (three) times daily. 03/12/18   [name], MD  isosorbide mononitrate (IMDUR) 120 MG 24 hr tablet Take 1 tablet (120 mg total) by mouth daily. 03/12/18   [name], MD  Lancet Devices [facility]) lancets Use as instructed daily. 07/05/16   [name], MD  metFORMIN (GLUCOPHAGE) 500 MG tablet Take 1 tablet (500 mg total) by mouth 2 (two) times daily with a meal. 03/12/18   Newlin, Enobong, MD  nitroGLYCERIN (NITROSTAT) 0.4 MG SL tablet PLACE 1 TABLET (0.4 MG TOTAL) UNDER THE TONGUE EVERY 5 (FIVE) MINUTES AS NEEDED FOR CHEST PAIN (UP TO 3 DOSES. IF TAKING 3RD DOSE CALL 911) 07/16/18   [name], MD  TRUEPLUS LANCETS 28G MISC USE AS INSTRUCTED 07/05/16   [name], MD    Family History Family History  Problem Relation Age of Onset  . Heart failure Mother   . Cancer Mother   . CAD Mother 81  . Heart failure Father   . Kidney failure Brother     Social History Social History   Tobacco Use  . Smoking status: Former Smoker    Quit date: 10/25/2010     Years since quitting: 8.1  . Smokeless tobacco: Never Used  Substance Use Topics  . Alcohol use: No    Comment: sts he drank for the first time in a long time last night   . Drug use: No     Allergies   Patient has no known allergies.   Review of Systems Review of Systems  HENT: Positive for nosebleeds.   All other systems reviewed and are negative.    Physical Exam Updated Vital Signs BP (!) 185/103 (BP Location: Left Arm)   Pulse 73   Temp (!) 97.1 F (36.2 C) (Oral)   Resp 18   SpO2 98%  Physical Exam Vitals signs and nursing note reviewed.  Constitutional:      General: He is not in acute distress.    Appearance: Normal appearance. He is well-developed.  HENT:     Head: Normocephalic and atraumatic.     Right Ear: Hearing normal.     Left Ear: Hearing normal.     Nose:     Right Nostril: Epistaxis present.     Comments: Single dilated vessel right anterior nasal septum Eyes:     Conjunctiva/sclera: Conjunctivae normal.     Pupils: Pupils are equal, round, and reactive to light.  Neck:     Musculoskeletal: Normal range of motion and neck supple.  Cardiovascular:     Rate and Rhythm: Regular rhythm.     Heart sounds: S1 normal and S2 normal. No murmur. No friction rub. No gallop.   Pulmonary:     Effort: Pulmonary effort is normal. No respiratory distress.     Breath sounds: Normal breath sounds.  Chest:     Chest wall: No tenderness.  Abdominal:     General: Bowel sounds are normal.     Palpations: Abdomen is soft.     Tenderness: There is no abdominal tenderness. There is no guarding or rebound. Negative signs include Murphy's sign and McBurney's sign.     Hernia: No hernia is present.  Musculoskeletal: Normal range of motion.  Skin:    General: Skin is warm and dry.     Findings: No rash.  Neurological:     Mental Status: He is alert and oriented to person, place, and time.     GCS: GCS eye subscore is 4. GCS verbal subscore is 5. GCS motor  subscore is 6.     Cranial Nerves: No cranial nerve deficit.     Sensory: No sensory deficit.     Coordination: Coordination normal.  Psychiatric:        Speech: Speech normal.        Behavior: Behavior normal.        Thought Content: Thought content normal.      ED Treatments / Results  Labs (all labs ordered are listed, but only abnormal results are displayed) Labs Reviewed - No data to display  EKG None  Radiology No results found.  Procedures .Epistaxis Management  Date/Time: 12/22/2018 6:49 AM Performed by: Pollina, Christopher J, MD Authorized by: Pollina, Christopher J, MD   Consent:    Consent obtained:  Verbal   Consent given by:  Patient   Risks discussed:  Bleeding, nasal injury and pain Universal protocol:    Procedure explained and questions answered to patient or proxy's satisfaction: yes     Relevant documents present and verified: yes     Test results available and properly labeled: yes     Imaging studies available: yes     Required blood products, implants, devices, and special equipment available: yes     Site/side marked: yes     Time out called: yes     Patient identity confirmed:  Verbally with patient Anesthesia (see MAR for exact dosages):    Anesthesia method:  Topical application   Topical anesthetic:  LET Procedure details:    Treatment site:  R anterior   Treatment method:  Silver nitrate   Treatment complexity:  Limited   Treatment episode: initial   Post-procedure details:    Assessment:  Bleeding stopped   Patient tolerance of procedure:  Tolerated well, no immediate complications   (including critical care   time)  Medications Ordered in ED Medications  silver nitrate applicators applicator 3 Stick (has no administration in time range)  oxymetazoline (AFRIN) 0.05 % nasal spray 1 spray (1 spray Right Nare Given 12/22/18 0636)  lidocaine-EPINEPHrine-tetracaine (LET) topical gel (3 mLs Topical Given 12/22/18 0637)     Initial  Impression / Assessment and Plan / ED Course  I have reviewed the triage vital signs and the nursing notes.  Pertinent labs & imaging results that were available during my care of the patient were reviewed by me and considered in my medical decision making (see chart for details).        Patient presents to the emergency department for evaluation of nosebleed.  Patient had very slight bleeding from the right anterior septum.  Patient treated with Afrin and then let, followed by silver nitrate.  Bleeding has stopped.  Blood pressure elevated here in the ER, due for all of his blood pressure medications this morning.  He is asymptomatic and no longer bleeding.  Patient counseled to take his blood pressure medication as soon as he gets home.  Final Clinical Impressions(s) / ED Diagnoses   Final diagnoses:  Acute anterior epistaxis    ED Discharge Orders    None       Pollina, Christopher J, MD 12/22/18 0652    Pollina, Christopher J, MD 12/22/18 0701  

## 2019-02-08 MED FILL — ?AMLODIPINE BESYLATE 10 MG: 10 | 30 days supply | Qty: 30 | Fill #6

## 2019-02-08 MED FILL — ISOSORBIDE MN 120 MG TAB SA: 120 | 30 days supply | Qty: 30 | Fill #6

## 2019-02-08 MED FILL — ?CARVEDILOL 25MG TABLE: 25 | 30 days supply | Qty: 60 | Fill #6

## 2019-02-08 MED FILL — NITROGLYCERIN 0.4 MG TAB SL: 0.4 | 25 days supply | Qty: 25 | Fill #2

## 2019-02-08 MED FILL — CLOPIDOGREL 75 MG TABLET: 75 | 30 days supply | Qty: 30 | Fill #6

## 2019-02-08 MED FILL — ?ATORVASTATIN 40MG TABLET: 40 | 30 days supply | Qty: 60 | Fill #4

## 2019-02-08 MED FILL — ?METFORMIN HCL 500MG TABLET: 500 | 30 days supply | Qty: 60 | Fill #6

## 2019-04-09 ENCOUNTER — Other Ambulatory Visit: Payer: Self-pay | Admitting: Family Medicine

## 2019-04-09 DIAGNOSIS — I10 Essential (primary) hypertension: Secondary | ICD-10-CM

## 2019-04-09 DIAGNOSIS — I2510 Atherosclerotic heart disease of native coronary artery without angina pectoris: Secondary | ICD-10-CM

## 2019-04-09 DIAGNOSIS — E1159 Type 2 diabetes mellitus with other circulatory complications: Secondary | ICD-10-CM

## 2019-04-22 ENCOUNTER — Telehealth: Payer: Self-pay | Admitting: Family Medicine

## 2019-04-22 NOTE — Telephone Encounter (Signed)
   Darrell [name] DOB: March 03, 1955 MRN: [id number]   RIDER WAIVER AND RELEASE OF LIABILITY  For purposes of improving physical access to our facilities, [facility] is pleased to partner with third parties to provide [facility] patients or other authorized individuals the option of convenient, on-demand ground transportation services (the Chi[employer]") through use of the technology service that enables users to request on-demand ground transportation from independent third-party providers.  By opting to use and accept these [employer], I, the undersigned, hereby agree on behalf of myself, and on behalf of any minor child using the [employer] for whom I am the parent or legal guardian, as follows:  1. Science writer provided to me are provided by independent third-party transportation providers who are not [employer] or employees and who are unaffiliated with [employer]. 2. [place] is neither a transportation carrier nor a common or public carrier. 3. [facility] has no control over the quality or safety of the transportation that occurs as a result of the [employer]. 4. [facility] cannot guarantee that any third-party transportation provider will complete any arranged transportation service. 5. [facility] makes no representation, warranty, or guarantee regarding the reliability, timeliness, quality, safety, suitability, or availability of any of the Transport Services or that they will be error free. 6. I fully understand that traveling by vehicle involves risks and dangers of serious bodily injury, including permanent disability, paralysis, and death. I agree, on behalf of myself and on behalf of any minor child using the Transport Services for whom I am the parent or legal guardian, that the entire risk arising out of my use of the [employer] remains solely with me, to the maximum extent permitted under applicable law. 7. The [employer] are provided "as is" and "as available." [facility] disclaims all representations and warranties, express, implied or statutory, not expressly set out in these terms, including the implied warranties of merchantability and fitness for a particular purpose. 8. I hereby waive and release [place], its agents, employees, officers, directors, representatives, insurers, attorneys, assigns, successors, subsidiaries, and affiliates from any and all past, present, or future claims, demands, liabilities, actions, causes of action, or suits of any kind directly or indirectly arising from acceptance and use of the [employer]. 9. I further waive and release [place] and its affiliates from all present and future liability and responsibility for any injury or death to persons or damages to property caused by or related to the use of the [employer]. 10. I have read this Waiver and Release of Liability, and I understand the terms used in it and their legal significance. This Waiver is freely and voluntarily given with the understanding that my right (as well as the right of any minor child for whom I am the parent or legal guardian using the [employer]) to legal recourse against [place] in connection with the [employer] is knowingly surrendered in return for use of these services.   I attest that I read the consent document to Darrell [name], gave [name] the opportunity to ask questions and answered the questions asked (if any). I affirm that Darrell [name] then provided consent for he's participation in this program.     [name]

## 2019-04-27 ENCOUNTER — Ambulatory Visit: Payer: Medicare Other | Attending: Family Medicine | Admitting: Physician Assistant

## 2019-04-27 ENCOUNTER — Other Ambulatory Visit: Payer: Self-pay

## 2019-04-27 DIAGNOSIS — Z1159 Encounter for screening for other viral diseases: Secondary | ICD-10-CM

## 2019-04-27 DIAGNOSIS — I10 Essential (primary) hypertension: Secondary | ICD-10-CM

## 2019-04-27 DIAGNOSIS — Z125 Encounter for screening for malignant neoplasm of prostate: Secondary | ICD-10-CM

## 2019-04-27 DIAGNOSIS — E1159 Type 2 diabetes mellitus with other circulatory complications: Secondary | ICD-10-CM

## 2019-04-27 DIAGNOSIS — Z1211 Encounter for screening for malignant neoplasm of colon: Secondary | ICD-10-CM

## 2019-04-27 DIAGNOSIS — I2510 Atherosclerotic heart disease of native coronary artery without angina pectoris: Secondary | ICD-10-CM

## 2019-04-27 DIAGNOSIS — I249 Acute ischemic heart disease, unspecified: Secondary | ICD-10-CM

## 2019-04-27 DIAGNOSIS — Z79899 Other long term (current) drug therapy: Secondary | ICD-10-CM

## 2019-04-27 MED ORDER — TRUEPLUS LANCETS 28G MISC: 5 refills | Status: DC

## 2019-04-27 MED ORDER — ATORVASTATIN CALCIUM 80 MG PO TABS: 80.0000 mg | ORAL_TABLET | Freq: Every evening | ORAL | 0 refills | Status: DC

## 2019-04-27 MED ORDER — TRUE METRIX BLOOD GLUCOSE TEST VI STRP: 1.0000 | ORAL_STRIP | Freq: Three times a day (TID) | 12 refills | Status: DC

## 2019-04-27 MED ORDER — ISOSORBIDE MONONITRATE ER 120 MG PO TB24: 120.0000 mg | ORAL_TABLET | Freq: Every day | ORAL | 0 refills | Status: DC

## 2019-04-27 MED ORDER — NITROGLYCERIN 0.4 MG SL SUBL: 0.4000 mg | SUBLINGUAL_TABLET | SUBLINGUAL | 0 refills | Status: DC | PRN

## 2019-04-27 MED ORDER — AMLODIPINE BESYLATE 10 MG PO TABS: 10.0000 mg | ORAL_TABLET | Freq: Every day | ORAL | 0 refills | Status: DC

## 2019-04-27 MED ORDER — METFORMIN HCL 500 MG PO TABS: 500.0000 mg | ORAL_TABLET | Freq: Two times a day (BID) | ORAL | 0 refills | Status: DC

## 2019-04-27 MED ORDER — CLOPIDOGREL BISULFATE 75 MG PO TABS: 75.0000 mg | ORAL_TABLET | Freq: Every day | ORAL | 0 refills | Status: DC

## 2019-04-27 MED ORDER — CARVEDILOL 25 MG PO TABS: 25.0000 mg | ORAL_TABLET | Freq: Two times a day (BID) | ORAL | 0 refills | Status: DC

## 2019-04-27 MED FILL — ?METFORMIN HCL 500MG TABLET: 500 | 30 days supply | Qty: 60 | Fill #0

## 2019-04-27 MED FILL — TRUE METRIX TEST STRIP: 30 days supply | Qty: 100 | Fill #0

## 2019-04-27 MED FILL — ISOSORBIDE MN 120 MG TAB SA: 120 | 30 days supply | Qty: 30 | Fill #0

## 2019-04-27 MED FILL — ?CARVEDILOL 25 MG TABLET: 25 | 30 days supply | Qty: 60 | Fill #0

## 2019-04-27 MED FILL — TRUEplus LANCETS 28G MISC: 30 days supply | Qty: 100 | Fill #0

## 2019-04-27 MED FILL — ?CLOPIDOGREL 75MG TA: 75 | 30 days supply | Qty: 30 | Fill #0

## 2019-04-27 MED FILL — ?NITROGLYCERIN 0.4MG SUBLIN: 0.4 | 25 days supply | Qty: 25 | Fill #0

## 2019-04-27 MED FILL — ATORVASTATIN 80 MG TABLET: 80 | 30 days supply | Qty: 30 | Fill #0

## 2019-04-27 MED FILL — AMLODIPINE BESYLATE 10 MG T: 10 | 30 days supply | Qty: 30 | Fill #0

## 2019-04-27 NOTE — Progress Notes (Signed)
Established Patient Office Visit  Subjective:  Patient ID: Darrell [name], male    DOB: Mar 23, 1955  Age: 64 y.o. MRN: [id number]  CC:  Chief Complaint  Patient presents with  . Medication Refill   Virtual Visit via Telephone Note  I connected with Darrell [name] on 04/28/19 at  3:30 PM EDT by telephone and verified that I am speaking with the correct person using two identifiers.  Location: Patient: home Provider: [facility]   I discussed the limitations, risks, security and privacy concerns of performing an evaluation and management service by telephone and the availability of in person appointments. I also discussed with the patient that there may be a patient responsible charge related to this service. The patient expressed understanding and agreed to proceed.   History of Present Illness: Darrell [name] presents for medication management.  Patient was last seen in our office in April 2020.  Patient reports that he has been compliant to his medications but has been out of medications for 1 week.  Reports that he has been following up with cardiology, on review of his chart, he does endorse that he saw a cardiologist when he was at the emergency department.  Reports that he has been having intermittent chest pain, states that he takes nitroglycerin with relief.  Reports that he does not check his blood pressure at home, states that he has been compliant to his medications.   Reports that he continues to take Metformin, reports that he checks his blood sugars on a daily basis, states that they average 104.  Denies any low readings.    Observations/Objective: Patient's chart was reviewed, no physical exam completed.     Past Medical History:  Diagnosis Date  . CKD (chronic kidney disease), stage III   . Claudication ([place])   . Coronary artery disease    a. 08/2001 s/p DES to RCA/RI;  b. 07/2006 s/p DES to p/d LCX;  c. 11/2007 CABGx5 (LIMA->LAD, VG->D1, LRA->OM1,  VG->PDA->LPL); c. 03/2015: Synergy DES to mid-Cx in the setting of NSTEMI; d. 11/2015 NSTEMI->Med Rx. e. NSTEMI 01/2018 -> med rx.  . Diabetes mellitus type 2 in nonobese ([place])    a. A1c 7.0 09/2012.  . Diastolic dysfunction    a. 02/2016 Echo: EF 50-55%, gr1DD, Ao sclerosis, dil Ao root (29m), sev dil LA, triv TR, PASP 243mg.  . Marland Kitchenyperlipidemia   . Hypertension   . Noncompliance   . Right carotid bruit    a. noted 01/2018, will need OP eval when patient complies with follow-up.    Past Surgical History:  Procedure Laterality Date  . CARDIAC CATHETERIZATION N/A 04/06/2015   Procedure: Left Heart Cath and Cors/Grafts Angiography;  Surgeon: JoLorretta HarpMD;  [facility];  Service: Cardiovascular;  Laterality: N/A;  . CARDIAC CATHETERIZATION N/A 04/06/2015   Procedure: Coronary Stent Intervention;  Surgeon: JoLorretta HarpMD;  2.5 mm x 12 mm long Synergy DES followed by  2.5 mm x 16 mm long Synergy DES    . CORONARY ARTERY BYPASS GRAFT     2009 LIMA to LAD, SVG to Diag, SVG to PDA and PL, left radial to OM  . CORONARY BALLOON ANGIOPLASTY N/A 05/04/2018   Procedure: CORONARY BALLOON ANGIOPLASTY;  Surgeon: BeLorretta HarpMD;  [facility];  Service: Cardiovascular;  Laterality: N/A;  Prox CFX  . LEFT HEART CATH N/A 10/24/2012   Procedure: LEFT HEART CATH;  Surgeon: ThTroy SineMD;  Location: [place] CATH LAB;  Service: Cardiovascular;  Laterality: N/A;  . LEFT HEART CATH AND CORS/GRAFTS ANGIOGRAPHY N/A 05/04/2018   Procedure: LEFT HEART CATH AND CORS/GRAFTS ANGIOGRAPHY;  Surgeon: Lorretta Harp, MD;  Location: [place] [facility] LAB;  Service: Cardiovascular;  Laterality: N/A;    Family History  Problem Relation Age of Onset  . Heart failure Mother   . Cancer Mother   . CAD Mother 23  . Heart failure Father   . Kidney failure Brother     Social History   Socioeconomic History  . Marital status: Married    Spouse name: Not on file  . Number of  children: 3  . Years of education: Not on file  . Highest education level: Not on file  Occupational History  . Not on file  Tobacco Use  . Smoking status: Former Smoker    Quit date: 10/25/2010    Years since quitting: 8.5  . Smokeless tobacco: Never Used  Substance and Sexual Activity  . Alcohol use: No    Comment: sts he drank for the first time in a long time last night   . Drug use: No  . Sexual activity: Not on file  Other Topics Concern  . Not on file  Social History Narrative   Lives at home with wife.     Social Determinants of Health   Financial Resource Strain:   . Difficulty of Paying Living Expenses:   Food Insecurity:   . Worried About Charity fundraiser in the Last Year:   . Arboriculturist in the Last Year:   Transportation Needs:   . Film/video editor (Medical):   Marland Kitchen Lack of Transportation (Non-Medical):   Physical Activity:   . Days of Exercise per Week:   . Minutes of Exercise per Session:   Stress:   . Feeling of Stress :   Social Connections:   . Frequency of Communication with Friends and Family:   . Frequency of Social Gatherings with Friends and Family:   . Attends Religious Services:   . Active Member of Clubs or Organizations:   . Attends Archivist Meetings:   Marland Kitchen Marital Status:   Intimate Partner Violence:   . Fear of Current or Ex-Partner:   . Emotionally Abused:   Marland Kitchen Physically Abused:   . Sexually Abused:     Outpatient Medications Prior to Visit  Medication Sig Dispense Refill  . amoxicillin (AMOXIL) 500 MG capsule Take 1 capsule (500 mg total) by mouth 3 (three) times daily. 21 capsule 0  . aspirin 81 MG EC tablet Take 1 tablet (81 mg total) by mouth daily. 90 tablet 3  . Blood Glucose Monitoring Suppl (TRUE METRIX METER) w/Device KIT USE AS INSTRUCTED 1 kit 0  . chlorhexidine (PERIDEX) 0.12 % solution Use as directed 15 mLs in the mouth or throat 2 (two) times daily. 120 mL 0  . Lancet Devices (ACCU-CHEK SOFTCLIX)  lancets Use as instructed daily. 1 each 5  . amLODipine (NORVASC) 10 MG tablet Take 1 tablet (10 mg total) by mouth daily. 30 tablet 6  . atorvastatin (LIPITOR) 80 MG tablet Take 1 tablet (80 mg total) by mouth every evening. 30 tablet 6  . carvedilol (COREG) 25 MG tablet Take 1 tablet (25 mg total) by mouth 2 (two) times daily with a meal. 60 tablet 6  . clopidogrel (PLAVIX) 75 MG tablet Take 1 tablet (75 mg total) by mouth daily. 30 tablet 6  .  glucose blood (TRUE METRIX BLOOD GLUCOSE TEST) test strip 1 each by Other route 3 (three) times daily. 100 each 12  . isosorbide mononitrate (IMDUR) 120 MG 24 hr tablet Take 1 tablet (120 mg total) by mouth daily. 30 tablet 6  . metFORMIN (GLUCOPHAGE) 500 MG tablet Take 1 tablet (500 mg total) by mouth 2 (two) times daily with a meal. 60 tablet 6  . nitroGLYCERIN (NITROSTAT) 0.4 MG SL tablet PLACE 1 TABLET (0.4 MG TOTAL) UNDER THE TONGUE EVERY 5 (FIVE) MINUTES AS NEEDED FOR CHEST PAIN (UP TO 3 DOSES. IF TAKING 3RD DOSE CALL 911) 25 tablet 2  . TRUEPLUS LANCETS 28G MISC USE AS INSTRUCTED 100 each 5   No facility-administered medications prior to visit.    No Known Allergies  ROS Review of Systems  Constitutional: Negative.   HENT: Negative.   Eyes: Negative.   Respiratory: Negative for chest tightness, shortness of breath and wheezing.   Cardiovascular: Positive for chest pain. Negative for palpitations.  Gastrointestinal: Negative.   Endocrine: Negative.   Genitourinary: Negative.   Musculoskeletal: Negative.   Skin: Negative.   Allergic/Immunologic: Negative.   Neurological: Positive for dizziness. Negative for syncope and weakness.  Hematological: Negative.   Psychiatric/Behavioral: Negative.       Objective:     There were no vitals taken for this visit. Wt Readings from Last 3 Encounters:  08/01/18 220 lb 14.4 oz (100.2 kg)  05/06/18 217 lb 1.6 oz (98.5 kg)  03/12/18 227 lb 9.6 oz (103.2 kg)     Health Maintenance Due    Topic Date Due  . Hepatitis C Screening  Never done  . OPHTHALMOLOGY EXAM  Never done  . COLONOSCOPY  Never done  . FOOT EXAM  07/05/2017  . URINE MICROALBUMIN  07/05/2017  . HEMOGLOBIN A1C  08/06/2018  . INFLUENZA VACCINE  08/29/2018    There are no preventive care reminders to display for this patient.  Lab Results  Component Value Date   TSH 1.579 04/04/2015   Lab Results  Component Value Date   WBC 6.6 08/07/2018   HGB 13.1 08/07/2018   HCT 40.0 08/07/2018   MCV 88.1 08/07/2018   PLT 204 08/07/2018   Lab Results  Component Value Date   NA 137 08/07/2018   K 4.4 08/07/2018   CO2 24 08/07/2018   GLUCOSE 120 (H) 08/07/2018   BUN 17 08/07/2018   CREATININE 1.41 (H) 08/07/2018   BILITOT 0.7 05/05/2018   ALKPHOS 68 05/05/2018   AST 19 05/05/2018   ALT 10 05/05/2018   PROT 6.0 (L) 05/05/2018   ALBUMIN 2.9 (L) 05/05/2018   CALCIUM 8.9 08/07/2018   ANIONGAP 8 08/07/2018   Lab Results  Component Value Date   CHOL 127 08/01/2018   Lab Results  Component Value Date   HDL 36 (L) 08/01/2018   Lab Results  Component Value Date   LDLCALC 78 08/01/2018   Lab Results  Component Value Date   TRIG 66 08/01/2018   Lab Results  Component Value Date   CHOLHDL 3.5 08/01/2018   Lab Results  Component Value Date   HGBA1C 5.2 02/05/2018      Assessment & Plan:   Problem List Items Addressed This Visit      Cardiovascular and Mediastinum   Coronary atherosclerosis, s/p CABG in 2009    Relevant Medications   amLODipine (NORVASC) 10 MG tablet   carvedilol (COREG) 25 MG tablet   isosorbide mononitrate (IMDUR) 120 MG 24 hr tablet  nitroGLYCERIN (NITROSTAT) 0.4 MG SL tablet   atorvastatin (LIPITOR) 80 MG tablet   HTN (hypertension)   Relevant Medications   amLODipine (NORVASC) 10 MG tablet   carvedilol (COREG) 25 MG tablet   isosorbide mononitrate (IMDUR) 120 MG 24 hr tablet   nitroGLYCERIN (NITROSTAT) 0.4 MG SL tablet   atorvastatin (LIPITOR) 80 MG tablet    clopidogrel (PLAVIX) 75 MG tablet   ACS (acute coronary syndrome) (HCC)   Relevant Medications   amLODipine (NORVASC) 10 MG tablet   carvedilol (COREG) 25 MG tablet   isosorbide mononitrate (IMDUR) 120 MG 24 hr tablet   nitroGLYCERIN (NITROSTAT) 0.4 MG SL tablet   atorvastatin (LIPITOR) 80 MG tablet    Other Visit Diagnoses    Screening for malignant neoplasm of prostate    -  Primary   Type 2 diabetes mellitus with other circulatory complication, without long-term current use of insulin (HCC)       Relevant Medications   atorvastatin (LIPITOR) 80 MG tablet   metFORMIN (GLUCOPHAGE) 500 MG tablet   glucose blood (TRUE METRIX BLOOD GLUCOSE TEST) test strip   Encounter for HCV screening test for low risk patient       Screen for colon cancer       Encounter for long-term current use of medication        1. Atherosclerosis of native coronary artery of native heart without angina pectoris Patient is a poor historian. Explained to patient that I would refill his medications  that he needs to make an appointment to be seen in the office in the next month for health maintenance including fasting labs.  Patient understands and agrees. - amLODipine (NORVASC) 10 MG tablet; Take 1 tablet (10 mg total) by mouth daily.  Dispense: 30 tablet; Refill: 0 - isosorbide mononitrate (IMDUR) 120 MG 24 hr tablet; Take 1 tablet (120 mg total) by mouth daily.  Dispense: 30 tablet; Refill: 0 - atorvastatin (LIPITOR) 80 MG tablet; Take 1 tablet (80 mg total) by mouth every evening.  Dispense: 30 tablet; Refill: 0  2. Essential hypertension  - clopidogrel (PLAVIX) 75 MG tablet; Take 1 tablet (75 mg total) by mouth daily.  Dispense: 30 tablet; Refill: 0  3. Type 2 diabetes mellitus with other circulatory complication, without long-term current use of insulin (HCC)  - metFORMIN (GLUCOPHAGE) 500 MG tablet; Take 1 tablet (500 mg total) by mouth 2 (two) times daily with a meal.  Dispense: 60 tablet; Refill: 0 -  glucose blood (TRUE METRIX BLOOD GLUCOSE TEST) test strip; 1 each by Other route 3 (three) times daily.  Dispense: 100 each; Refill: 12  Meds ordered this encounter  Medications  . amLODipine (NORVASC) 10 MG tablet    Sig: Take 1 tablet (10 mg total) by mouth daily.    Dispense:  30 tablet    Refill:  0    Order Specific Question:   Supervising Provider    Answer:   Joya Gaskins, PATRICK E [[id number]]  . carvedilol (COREG) 25 MG tablet    Sig: Take 1 tablet (25 mg total) by mouth 2 (two) times daily with a meal.    Dispense:  60 tablet    Refill:  0    Order Specific Question:   Supervising Provider    Answer:   Joya Gaskins, PATRICK E [[id number]]  . isosorbide mononitrate (IMDUR) 120 MG 24 hr tablet    Sig: Take 1 tablet (120 mg total) by mouth daily.    Dispense:  30 tablet  Refill:  0    Order Specific Question:   Supervising Provider    Answer:   Asencion Noble E [[id number]]  . nitroGLYCERIN (NITROSTAT) 0.4 MG SL tablet    Sig: Place 1 tablet (0.4 mg total) under the tongue every 5 (five) minutes as needed for chest pain (up to 3 doses. If taking 3rd dose call 911).    Dispense:  25 tablet    Refill:  0    Order Specific Question:   Supervising Provider    Answer:   Joya Gaskins, PATRICK E [[id number]]  . atorvastatin (LIPITOR) 80 MG tablet    Sig: Take 1 tablet (80 mg total) by mouth every evening.    Dispense:  30 tablet    Refill:  0    Order Specific Question:   Supervising Provider    Answer:   Joya Gaskins, PATRICK E [[id number]]  . clopidogrel (PLAVIX) 75 MG tablet    Sig: Take 1 tablet (75 mg total) by mouth daily.    Dispense:  30 tablet    Refill:  0    Order Specific Question:   Supervising Provider    Answer:   Joya Gaskins, PATRICK E [[id number]]  . metFORMIN (GLUCOPHAGE) 500 MG tablet    Sig: Take 1 tablet (500 mg total) by mouth 2 (two) times daily with a meal.    Dispense:  60 tablet    Refill:  0    Order Specific Question:   Supervising Provider    Answer:   Asencion Noble E [[id number]]  . glucose blood (TRUE  METRIX BLOOD GLUCOSE TEST) test strip    Sig: 1 each by Other route 3 (three) times daily.    Dispense:  100 each    Refill:  12    Order Specific Question:   Supervising Provider    Answer:   Asencion Noble E [[id number]]  . TRUEplus Lancets 28G MISC    Sig: USE AS INSTRUCTED    Dispense:  100 each    Refill:  5    Order Specific Question:   Supervising Provider    Answer:   Asencion Noble E [[id number]]   Follow Up Instructions:    I discussed the assessment and treatment plan with the patient. The patient was provided an opportunity to ask questions and all were answered. The patient agreed with the plan and demonstrated an understanding of the instructions.   The patient was advised to call back or seek an in-person evaluation if the symptoms worsen or if the condition fails to improve as anticipated.    Follow-up: Return in about 4 weeks (around 05/25/2019) for with Dr. Margarita Rana for medication mgmt / fasting labs.    I have reviewed the patient's medical history (PMH, PSH, Social History, Family History, Medications, and allergies) , and have been updated if relevant. I spent 25 minutes reviewing chart and  face to face time with patient.     Loraine Grip Mayers, PA-C

## 2019-04-28 NOTE — Patient Instructions (Signed)
Follow up in the office with Dr. Alvis Lemmings for health maintenance, medication refills and fasting labs.   Angina  Angina is very bad discomfort or pain in the chest, neck, arm, jaw, or back. The discomfort is caused by a lack of blood in the middle layer of the heart wall (myocardium). What are the causes? This condition is caused by a buildup of fat and cholesterol (plaque) in your arteries (atherosclerosis). This buildup narrows the arteries and makes it hard for blood to flow. What increases the risk? You are more likely to develop this condition if:  You have high levels of cholesterol in your blood.  You have high blood pressure (hypertension).  You have diabetes.  You have a family history of heart disease.  You are not active, or you do not exercise enough.  You feel sad (depressed).  You have been treated with high energy rays (radiation) on the left side of your chest. Other risk factors are:  Using tobacco.  Being very overweight (obese).  Eating a diet high in unhealthy fats (saturated fats).  Having stress, or being exposed to things that cause stress.  Using drugs, such as cocaine. Women have a greater risk for angina if:  They are older than 47.  They have stopped having their period (are in postmenopause). What are the signs or symptoms? Common symptoms of this condition in both men and women may include:  Chest pain, which may: ? Feel like a crushing or squeezing in the chest. ? Feel like a tightness, pressure, fullness, or heaviness in the chest. ? Last for more than a few minutes at a time. ? Stop and come back (recur) after a few minutes.  Pain in the neck, arm, jaw, or back.  Heartburn or upset stomach (indigestion) for no reason.  Being short of breath.  Feeling sick to your stomach (nauseous).  Sudden cold sweats. Women and people with diabetes may have other symptoms that are not usual, such as feeling:  Tired (fatigue).  Worried or  nervous (anxious) for no reason.  Weak for no reason.  Dizzy or passing out (fainting). How is this treated? This condition may be treated with:  Medicines. These are given to: ? Prevent blood clots. ? Prevent heart attack. ? Relax blood vessels and improve blood flow to the heart (nitrates). ? Reduce blood pressure. ? Improve the pumping action of the heart. ? Reduce fat and cholesterol in the blood.  A procedure to widen a narrowed or blocked artery in the heart (angioplasty).  Surgery to allow blood to go around a blocked artery (coronary artery bypass surgery). Follow these instructions at home: Medicines  Take over-the-counter and prescription medicines only as told by your doctor.  Do not take these medicines unless your doctor says that you can: ? NSAIDs. These include:  Ibuprofen.  Naproxen. ? Vitamin supplements that have vitamin A, vitamin E, or both. ? Hormone therapy that contains estrogen with or without progestin. Eating and drinking   Eat a heart-healthy diet that includes: ? Lots of fresh fruits and vegetables. ? Whole grains. ? Low-fat (lean) protein. ? Low-fat dairy products.  Follow instructions from your doctor about what you cannot eat or drink. Activity  Follow an exercise program that your doctor tells you.  Talk with your doctor about joining a program to help improve the health of your heart (cardiac rehab).  When you feel tired, take a break. Plan breaks if you know you are going to feel tired.  Lifestyle   Do not use any products that contain nicotine or tobacco. This includes cigarettes, e-cigarettes, and chewing tobacco. If you need help quitting, ask your doctor.  If your doctor says you can drink alcohol: ? Limit how much you use to:  0-1 drink a day for women who are not pregnant.  0-2 drinks a day for men. ? Be aware of how much alcohol is in your drink. In the [place] one drink equals:  One 12 oz bottle of beer (355  mL).  One 5 oz glass of wine (148 mL).  One 1 oz glass of hard liquor (44 mL). General instructions  Stay at a healthy weight. If your doctor tells you to do so, work with him or her to lose weight.  Learn to deal with stress. If you need help, ask your doctor.  Keep your vaccines up to date. Get a flu shot every year.  Talk with your doctor if you feel sad. Take a screening test to see if you are at risk for depression.  Work with your doctor to manage any other health problems that you have. These may include diabetes or high blood pressure.  Keep all follow-up visits as told by your doctor. This is important. Get help right away if:  You have pain in your chest, neck, arm, jaw, or back, and the pain: ? Lasts more than a few minutes. ? Comes back. ? Does not get better after you take medicine under your tongue (sublingual nitroglycerin). ? Keeps getting worse. ? Comes more often.  You have any of these problems for no reason: ? Sweating a lot. ? Heartburn or upset stomach. ? Shortness of breath. ? Trouble breathing. ? Feeling sick to your stomach. ? Throwing up (vomiting). ? Feeling more tired than normal. ? Feeling nervous or worrying more than normal. ? Weakness.  You are suddenly dizzy or light-headed.  You pass out. These symptoms may be an emergency. Do not wait to see if the symptoms will go away. Get medical help right away. Call your local emergency services (911 in the [place] Do not drive yourself to the hospital. Summary  Angina is very bad discomfort or pain in the chest, neck, arm, neck, or back.  Symptoms include chest pain, heartburn or upset stomach for no reason, and shortness of breath.  Women or people with diabetes may have symptoms that are not usual, such as feeling nervous or worried for no reason, weak for no reason, or tired.  Take all medicines only as told by your doctor.  You should eat a heart-healthy diet and follow an exercise  program. This information is not intended to replace advice given to you by your health care provider. Make sure you discuss any questions you have with your health care provider. Document Revised: 09/01/2017 Document Reviewed: 09/01/2017 Elsevier Patient Education  [place].

## 2019-07-12 ENCOUNTER — Ambulatory Visit: Payer: Medicare Other | Admitting: Family Medicine

## 2019-08-05 ENCOUNTER — Other Ambulatory Visit: Payer: Self-pay

## 2019-08-05 ENCOUNTER — Inpatient Hospital Stay (HOSPITAL_COMMUNITY)
Admission: EM | Admit: 2019-08-05 | Discharge: 2019-08-08 | DRG: 281 | Disposition: A | Discharge status: Home / Self Care | Payer: Medicare Other | Attending: Internal Medicine | Admitting: Internal Medicine

## 2019-08-05 ENCOUNTER — Encounter (HOSPITAL_COMMUNITY): Payer: Self-pay | Admitting: Emergency Medicine

## 2019-08-05 DIAGNOSIS — E119 Type 2 diabetes mellitus without complications: Secondary | ICD-10-CM

## 2019-08-05 DIAGNOSIS — Z841 Family history of disorders of kidney and ureter: Secondary | ICD-10-CM

## 2019-08-05 DIAGNOSIS — I5032 Chronic diastolic (congestive) heart failure: Secondary | ICD-10-CM | POA: Diagnosis present

## 2019-08-05 DIAGNOSIS — R319 Hematuria, unspecified: Secondary | ICD-10-CM | POA: Diagnosis present

## 2019-08-05 DIAGNOSIS — I2510 Atherosclerotic heart disease of native coronary artery without angina pectoris: Secondary | ICD-10-CM

## 2019-08-05 DIAGNOSIS — B888 Other specified infestations: Secondary | ICD-10-CM | POA: Diagnosis present

## 2019-08-05 DIAGNOSIS — E785 Hyperlipidemia, unspecified: Secondary | ICD-10-CM | POA: Diagnosis present

## 2019-08-05 DIAGNOSIS — R079 Chest pain, unspecified: Secondary | ICD-10-CM

## 2019-08-05 DIAGNOSIS — N1831 Chronic kidney disease, stage 3a: Secondary | ICD-10-CM | POA: Diagnosis present

## 2019-08-05 DIAGNOSIS — Z20822 Contact with and (suspected) exposure to covid-19: Secondary | ICD-10-CM | POA: Diagnosis present

## 2019-08-05 DIAGNOSIS — Z590 Homelessness: Secondary | ICD-10-CM

## 2019-08-05 DIAGNOSIS — N1330 Unspecified hydronephrosis: Secondary | ICD-10-CM | POA: Diagnosis not present

## 2019-08-05 DIAGNOSIS — I214 Non-ST elevation (NSTEMI) myocardial infarction: Secondary | ICD-10-CM | POA: Diagnosis not present

## 2019-08-05 DIAGNOSIS — R0989 Other specified symptoms and signs involving the circulatory and respiratory systems: Secondary | ICD-10-CM | POA: Diagnosis present

## 2019-08-05 DIAGNOSIS — Z7984 Long term (current) use of oral hypoglycemic drugs: Secondary | ICD-10-CM

## 2019-08-05 DIAGNOSIS — I10 Essential (primary) hypertension: Secondary | ICD-10-CM

## 2019-08-05 DIAGNOSIS — E1122 Type 2 diabetes mellitus with diabetic chronic kidney disease: Secondary | ICD-10-CM | POA: Diagnosis present

## 2019-08-05 DIAGNOSIS — Z951 Presence of aortocoronary bypass graft: Secondary | ICD-10-CM

## 2019-08-05 DIAGNOSIS — I499 Cardiac arrhythmia, unspecified: Secondary | ICD-10-CM | POA: Diagnosis not present

## 2019-08-05 DIAGNOSIS — R0789 Other chest pain: Secondary | ICD-10-CM | POA: Diagnosis not present

## 2019-08-05 DIAGNOSIS — I161 Hypertensive emergency: Secondary | ICD-10-CM | POA: Diagnosis present

## 2019-08-05 DIAGNOSIS — Z7902 Long term (current) use of antithrombotics/antiplatelets: Secondary | ICD-10-CM

## 2019-08-05 DIAGNOSIS — Z599 Problem related to housing and economic circumstances, unspecified: Secondary | ICD-10-CM

## 2019-08-05 DIAGNOSIS — Z8249 Family history of ischemic heart disease and other diseases of the circulatory system: Secondary | ICD-10-CM

## 2019-08-05 DIAGNOSIS — Z9119 Patient's noncompliance with other medical treatment and regimen: Secondary | ICD-10-CM

## 2019-08-05 DIAGNOSIS — R310 Gross hematuria: Secondary | ICD-10-CM | POA: Diagnosis present

## 2019-08-05 DIAGNOSIS — Z7982 Long term (current) use of aspirin: Secondary | ICD-10-CM

## 2019-08-05 DIAGNOSIS — R231 Pallor: Secondary | ICD-10-CM | POA: Diagnosis not present

## 2019-08-05 DIAGNOSIS — R0602 Shortness of breath: Secondary | ICD-10-CM | POA: Diagnosis not present

## 2019-08-05 DIAGNOSIS — E7800 Pure hypercholesterolemia, unspecified: Secondary | ICD-10-CM | POA: Diagnosis present

## 2019-08-05 DIAGNOSIS — I160 Hypertensive urgency: Secondary | ICD-10-CM | POA: Diagnosis not present

## 2019-08-05 DIAGNOSIS — E1151 Type 2 diabetes mellitus with diabetic peripheral angiopathy without gangrene: Secondary | ICD-10-CM | POA: Diagnosis present

## 2019-08-05 DIAGNOSIS — Z87891 Personal history of nicotine dependence: Secondary | ICD-10-CM

## 2019-08-05 DIAGNOSIS — I130 Hypertensive heart and chronic kidney disease with heart failure and stage 1 through stage 4 chronic kidney disease, or unspecified chronic kidney disease: Secondary | ICD-10-CM | POA: Diagnosis present

## 2019-08-05 DIAGNOSIS — Z79899 Other long term (current) drug therapy: Secondary | ICD-10-CM

## 2019-08-05 DIAGNOSIS — R072 Precordial pain: Secondary | ICD-10-CM | POA: Diagnosis not present

## 2019-08-05 DIAGNOSIS — E1159 Type 2 diabetes mellitus with other circulatory complications: Secondary | ICD-10-CM

## 2019-08-05 DIAGNOSIS — I252 Old myocardial infarction: Secondary | ICD-10-CM

## 2019-08-05 MED ORDER — SODIUM CHLORIDE 0.9% FLUSH: 3.0000 mL | Freq: Once | INTRAVENOUS | Status: DC

## 2019-08-05 NOTE — ED Triage Notes (Addendum)
Patient arrived with EMS from home reports central chest pain with SOB this evening , no emesis or diaphoresis , patient added hematuria today , he received ASA 324 mg and 1 NTG sl by EMS prior to arrival . History of CAD/CABG - [facility]. Patient has bed bugs .

## 2019-08-06 ENCOUNTER — Encounter (HOSPITAL_COMMUNITY)
Admission: EM | Disposition: A | Discharge status: Home / Self Care | Payer: Self-pay | Source: Home / Self Care | Attending: Internal Medicine

## 2019-08-06 ENCOUNTER — Inpatient Hospital Stay (HOSPITAL_COMMUNITY): Payer: Medicare Other

## 2019-08-06 ENCOUNTER — Emergency Department (HOSPITAL_COMMUNITY): Payer: Medicare Other

## 2019-08-06 DIAGNOSIS — R0989 Other specified symptoms and signs involving the circulatory and respiratory systems: Secondary | ICD-10-CM | POA: Diagnosis present

## 2019-08-06 DIAGNOSIS — Z599 Problem related to housing and economic circumstances, unspecified: Secondary | ICD-10-CM | POA: Diagnosis not present

## 2019-08-06 DIAGNOSIS — E119 Type 2 diabetes mellitus without complications: Secondary | ICD-10-CM | POA: Diagnosis not present

## 2019-08-06 DIAGNOSIS — E785 Hyperlipidemia, unspecified: Secondary | ICD-10-CM | POA: Diagnosis present

## 2019-08-06 DIAGNOSIS — I25119 Atherosclerotic heart disease of native coronary artery with unspecified angina pectoris: Secondary | ICD-10-CM

## 2019-08-06 DIAGNOSIS — E7800 Pure hypercholesterolemia, unspecified: Secondary | ICD-10-CM | POA: Diagnosis not present

## 2019-08-06 DIAGNOSIS — I2510 Atherosclerotic heart disease of native coronary artery without angina pectoris: Secondary | ICD-10-CM | POA: Diagnosis present

## 2019-08-06 DIAGNOSIS — E1122 Type 2 diabetes mellitus with diabetic chronic kidney disease: Secondary | ICD-10-CM | POA: Diagnosis present

## 2019-08-06 DIAGNOSIS — I160 Hypertensive urgency: Secondary | ICD-10-CM

## 2019-08-06 DIAGNOSIS — Z590 Homelessness: Secondary | ICD-10-CM | POA: Diagnosis not present

## 2019-08-06 DIAGNOSIS — R310 Gross hematuria: Secondary | ICD-10-CM | POA: Diagnosis present

## 2019-08-06 DIAGNOSIS — I214 Non-ST elevation (NSTEMI) myocardial infarction: Secondary | ICD-10-CM | POA: Diagnosis present

## 2019-08-06 DIAGNOSIS — R072 Precordial pain: Secondary | ICD-10-CM | POA: Diagnosis not present

## 2019-08-06 DIAGNOSIS — Z841 Family history of disorders of kidney and ureter: Secondary | ICD-10-CM | POA: Diagnosis not present

## 2019-08-06 DIAGNOSIS — N1330 Unspecified hydronephrosis: Secondary | ICD-10-CM | POA: Diagnosis not present

## 2019-08-06 DIAGNOSIS — B888 Other specified infestations: Secondary | ICD-10-CM | POA: Diagnosis present

## 2019-08-06 DIAGNOSIS — Z87891 Personal history of nicotine dependence: Secondary | ICD-10-CM | POA: Diagnosis not present

## 2019-08-06 DIAGNOSIS — Z20822 Contact with and (suspected) exposure to covid-19: Secondary | ICD-10-CM | POA: Diagnosis present

## 2019-08-06 DIAGNOSIS — N1831 Chronic kidney disease, stage 3a: Secondary | ICD-10-CM

## 2019-08-06 DIAGNOSIS — Z951 Presence of aortocoronary bypass graft: Secondary | ICD-10-CM | POA: Diagnosis not present

## 2019-08-06 DIAGNOSIS — N390 Urinary tract infection, site not specified: Secondary | ICD-10-CM

## 2019-08-06 DIAGNOSIS — I209 Angina pectoris, unspecified: Secondary | ICD-10-CM | POA: Diagnosis not present

## 2019-08-06 DIAGNOSIS — E1151 Type 2 diabetes mellitus with diabetic peripheral angiopathy without gangrene: Secondary | ICD-10-CM | POA: Diagnosis present

## 2019-08-06 DIAGNOSIS — I5032 Chronic diastolic (congestive) heart failure: Secondary | ICD-10-CM | POA: Diagnosis present

## 2019-08-06 DIAGNOSIS — Q600 Renal agenesis, unilateral: Secondary | ICD-10-CM | POA: Diagnosis not present

## 2019-08-06 DIAGNOSIS — R079 Chest pain, unspecified: Secondary | ICD-10-CM

## 2019-08-06 DIAGNOSIS — R319 Hematuria, unspecified: Secondary | ICD-10-CM

## 2019-08-06 DIAGNOSIS — I252 Old myocardial infarction: Secondary | ICD-10-CM | POA: Diagnosis not present

## 2019-08-06 DIAGNOSIS — Z9119 Patient's noncompliance with other medical treatment and regimen: Secondary | ICD-10-CM | POA: Diagnosis not present

## 2019-08-06 DIAGNOSIS — N3289 Other specified disorders of bladder: Secondary | ICD-10-CM | POA: Diagnosis not present

## 2019-08-06 DIAGNOSIS — I161 Hypertensive emergency: Secondary | ICD-10-CM | POA: Diagnosis present

## 2019-08-06 DIAGNOSIS — Z7984 Long term (current) use of oral hypoglycemic drugs: Secondary | ICD-10-CM | POA: Diagnosis not present

## 2019-08-06 DIAGNOSIS — R0602 Shortness of breath: Secondary | ICD-10-CM | POA: Diagnosis not present

## 2019-08-06 DIAGNOSIS — I130 Hypertensive heart and chronic kidney disease with heart failure and stage 1 through stage 4 chronic kidney disease, or unspecified chronic kidney disease: Secondary | ICD-10-CM | POA: Diagnosis present

## 2019-08-06 DIAGNOSIS — N261 Atrophy of kidney (terminal): Secondary | ICD-10-CM | POA: Diagnosis not present

## 2019-08-06 DIAGNOSIS — Z8249 Family history of ischemic heart disease and other diseases of the circulatory system: Secondary | ICD-10-CM | POA: Diagnosis not present

## 2019-08-06 LAB — BASIC METABOLIC PANEL
Anion gap: 10 (ref 5–15)
BUN: 16 mg/dL (ref 8–23)
CO2: 21 mmol/L — ABNORMAL LOW (ref 22–32)
Calcium: 9 mg/dL (ref 8.9–10.3)
Chloride: 108 mmol/L (ref 98–111)
Creatinine, Ser: 1.5 mg/dL — ABNORMAL HIGH (ref 0.61–1.24)
GFR calc Af Amer: 57 mL/min — ABNORMAL LOW (ref >60)
GFR calc non Af Amer: 49 mL/min — ABNORMAL LOW (ref >60)
Glucose, Bld: 129 mg/dL — ABNORMAL HIGH (ref 70–99)
Potassium: 4.2 mmol/L (ref 3.5–5.1)
Sodium: 139 mmol/L (ref 135–145)

## 2019-08-06 LAB — GLUCOSE, CAPILLARY
Glucose-Capillary: 152 mg/dL — ABNORMAL HIGH (ref 70–99)
Glucose-Capillary: 149 mg/dL — ABNORMAL HIGH (ref 70–99)

## 2019-08-06 LAB — ECHOCARDIOGRAM COMPLETE
Height: 73 in
Weight: 3633.18 oz

## 2019-08-06 LAB — URINALYSIS, ROUTINE W REFLEX MICROSCOPIC

## 2019-08-06 LAB — CBC
HCT: 38.4 % — ABNORMAL LOW (ref 39.0–52.0)
HCT: 43.7 % (ref 39.0–52.0)
Hemoglobin: 12.5 g/dL — ABNORMAL LOW (ref 13.0–17.0)
Hemoglobin: 14.5 g/dL (ref 13.0–17.0)
MCH: 28.3 pg (ref 26.0–34.0)
MCH: 28.8 pg (ref 26.0–34.0)
MCHC: 32.6 g/dL (ref 30.0–36.0)
MCHC: 33.2 g/dL (ref 30.0–36.0)
MCV: 86.7 fL (ref 80.0–100.0)
MCV: 87.1 fL (ref 80.0–100.0)
Platelets: 205 10*3/uL (ref 150–400)
Platelets: 225 10*3/uL (ref 150–400)
RBC: 4.41 MIL/uL (ref 4.22–5.81)
RBC: 5.04 MIL/uL (ref 4.22–5.81)
RDW: 14.2 % (ref 11.5–15.5)
RDW: 14.4 % (ref 11.5–15.5)
WBC: 7.5 10*3/uL (ref 4.0–10.5)
WBC: 7.6 10*3/uL (ref 4.0–10.5)
nRBC: 0 % (ref 0.0–0.2)
nRBC: 0 % (ref 0.0–0.2)

## 2019-08-06 LAB — LIPID PANEL
Cholesterol: 167 mg/dL (ref 0–200)
HDL: 31 mg/dL — ABNORMAL LOW (ref >40)
LDL Cholesterol: 120 mg/dL — ABNORMAL HIGH (ref 0–99)
Total CHOL/HDL Ratio: 5.4 RATIO
Triglycerides: 81 mg/dL (ref <150)
VLDL: 16 mg/dL (ref 0–40)

## 2019-08-06 LAB — HEPARIN LEVEL (UNFRACTIONATED)
Heparin Unfractionated: 0.69 IU/mL (ref 0.30–0.70)
Heparin Unfractionated: 0.38 IU/mL (ref 0.30–0.70)

## 2019-08-06 LAB — SARS CORONAVIRUS 2 BY RT PCR (HOSPITAL ORDER, PERFORMED IN ~~LOC~~ HOSPITAL LAB): SARS Coronavirus 2: NEGATIVE

## 2019-08-06 LAB — TROPONIN I (HIGH SENSITIVITY)
Troponin I (High Sensitivity): 24 ng/L — ABNORMAL HIGH (ref <18)
Troponin I (High Sensitivity): 193 ng/L (ref <18)
Troponin I (High Sensitivity): 289 ng/L (ref <18)

## 2019-08-06 LAB — HIV ANTIBODY (ROUTINE TESTING W REFLEX): HIV Screen 4th Generation wRfx: NONREACTIVE

## 2019-08-06 LAB — URINALYSIS, MICROSCOPIC (REFLEX): RBC / HPF: >50 RBC/hpf (ref 0–5)

## 2019-08-06 LAB — HEMOGLOBIN A1C
Hgb A1c MFr Bld: 5.9 % — ABNORMAL HIGH (ref 4.8–5.6)
Mean Plasma Glucose: 122.63 mg/dL

## 2019-08-06 LAB — MRSA PCR SCREENING: MRSA by PCR: NEGATIVE

## 2019-08-06 LAB — BRAIN NATRIURETIC PEPTIDE: B Natriuretic Peptide: 223.5 pg/mL — ABNORMAL HIGH (ref 0.0–100.0)

## 2019-08-06 SURGERY — LEFT HEART CATH AND CORS/GRAFTS ANGIOGRAPHY
Anesthesia: LOCAL

## 2019-08-06 MED ORDER — NITROGLYCERIN IN D5W 200-5 MCG/ML-% IV SOLN
0.0000 ug/min | INTRAVENOUS | Status: DC
  Administered 2019-08-06: 5 ug/min via INTRAVENOUS
  Filled 2019-08-06: qty 250

## 2019-08-06 MED ORDER — CLOPIDOGREL BISULFATE 75 MG PO TABS
75.0000 mg | ORAL_TABLET | Freq: Every day | ORAL | Status: DC
  Administered 2019-08-06 – 2019-08-08 (×3): 75 mg via ORAL
  Filled 2019-08-06 (×3): qty 1

## 2019-08-06 MED ORDER — HEPARIN (PORCINE) 25000 UT/250ML-% IV SOLN
1250.0000 [IU]/h | INTRAVENOUS | Status: DC
  Administered 2019-08-06: 1500 [IU]/h via INTRAVENOUS
  Administered 2019-08-06: 1250 [IU]/h via INTRAVENOUS
  Filled 2019-08-06 (×2): qty 250

## 2019-08-06 MED ORDER — AMLODIPINE BESYLATE 10 MG PO TABS
10.0000 mg | ORAL_TABLET | Freq: Every day | ORAL | Status: DC
  Administered 2019-08-07 – 2019-08-08 (×2): 10 mg via ORAL
  Filled 2019-08-06 (×2): qty 1

## 2019-08-06 MED ORDER — CARVEDILOL 25 MG PO TABS
25.0000 mg | ORAL_TABLET | Freq: Two times a day (BID) | ORAL | Status: DC
  Administered 2019-08-06 – 2019-08-08 (×5): 25 mg via ORAL
  Filled 2019-08-06 (×5): qty 1

## 2019-08-06 MED ORDER — PERFLUTREN LIPID MICROSPHERE
1.0000 mL | INTRAVENOUS | Status: AC | PRN
  Administered 2019-08-06: 1.5 mL via INTRAVENOUS
  Filled 2019-08-06: qty 10

## 2019-08-06 MED ORDER — ISOSORBIDE MONONITRATE ER 60 MG PO TB24
120.0000 mg | ORAL_TABLET | Freq: Every day | ORAL | Status: DC
  Administered 2019-08-06 – 2019-08-08 (×3): 120 mg via ORAL
  Filled 2019-08-06 (×3): qty 2

## 2019-08-06 MED ORDER — ATORVASTATIN CALCIUM 80 MG PO TABS
80.0000 mg | ORAL_TABLET | Freq: Every evening | ORAL | Status: DC
  Administered 2019-08-06 – 2019-08-08 (×3): 80 mg via ORAL
  Filled 2019-08-06 (×3): qty 1

## 2019-08-06 MED ORDER — AMLODIPINE BESYLATE 5 MG PO TABS
10.0000 mg | ORAL_TABLET | Freq: Once | ORAL | Status: AC
  Administered 2019-08-06: 10 mg via ORAL
  Filled 2019-08-06: qty 2

## 2019-08-06 MED ORDER — ACETAMINOPHEN 325 MG PO TABS: 650.0000 mg | ORAL_TABLET | ORAL | Status: DC | PRN

## 2019-08-06 MED ORDER — ONDANSETRON HCL 4 MG/2ML IJ SOLN: 4.0000 mg | Freq: Four times a day (QID) | INTRAMUSCULAR | Status: DC | PRN

## 2019-08-06 MED ORDER — ASPIRIN 81 MG PO CHEW
81.0000 mg | CHEWABLE_TABLET | Freq: Every day | ORAL | Status: DC
  Administered 2019-08-07 – 2019-08-08 (×2): 81 mg via ORAL
  Filled 2019-08-06 (×2): qty 1

## 2019-08-06 MED ORDER — ALUM & MAG HYDROXIDE-SIMETH 200-200-20 MG/5ML PO SUSP: 30.0000 mL | Freq: Four times a day (QID) | ORAL | Status: DC | PRN

## 2019-08-06 MED ORDER — INSULIN ASPART 100 UNIT/ML ~~LOC~~ SOLN
0.0000 [IU] | Freq: Three times a day (TID) | SUBCUTANEOUS | Status: DC
  Administered 2019-08-07: 2 [IU] via SUBCUTANEOUS
  Administered 2019-08-07 – 2019-08-08 (×3): 1 [IU] via SUBCUTANEOUS

## 2019-08-06 MED ORDER — HEPARIN BOLUS VIA INFUSION
4000.0000 [IU] | Freq: Once | INTRAVENOUS | Status: AC
  Administered 2019-08-06: 4000 [IU] via INTRAVENOUS
  Filled 2019-08-06: qty 4000

## 2019-08-06 NOTE — Progress Notes (Signed)
  Echocardiogram 2D Echocardiogram has been performed.  Leta Jungling M 08/06/2019, 2:23 PM

## 2019-08-06 NOTE — Progress Notes (Signed)
ANTICOAGULATION CONSULT NOTE  Pharmacy Consult for Heparin Indication: chest pain/ACS  No Known Allergies  Patient Measurements: Height: 6\' 1"  (185.4 cm) Weight: 103 kg (227 lb 1.2 oz) IBW/kg (Calculated) : 79.9 Heparin Dosing Weight: 105 kg  Vital Signs: BP: 167/107 (07/09 1030) Pulse Rate: 92 (07/09 1030)  Labs: Recent Labs    08/06/19 0000 08/06/19 0437 08/06/19 0952 08/06/19 1010 08/06/19 1801  HGB 14.5  --   --   --   --   HCT 43.7  --   --   --   --   PLT 225  --   --   --   --   HEPARINUNFRC  --   --   --  0.69 0.38  CREATININE 1.50*  --   --   --   --   TROPONINIHS 24* 193* 289*  --   --     Estimated Creatinine Clearance: 63.5 mL/min (A) (by C-G formula based on SCr of 1.5 mg/dL (H)).   Medical History: Past Medical History:  Diagnosis Date  . CKD (chronic kidney disease), stage III   . Claudication (HCC)   . Coronary artery disease    a. 08/2001 s/p DES to RCA/RI;  b. 07/2006 s/p DES to p/d LCX;  c. 11/2007 CABGx5 (LIMA->LAD, VG->D1, LRA->OM1, VG->PDA->LPL); c. 03/2015: Synergy DES to mid-Cx in the setting of NSTEMI; d. 11/2015 NSTEMI->Med Rx. e. NSTEMI 01/2018 -> med rx.  . Diabetes mellitus type 2 in nonobese (HCC)    a. A1c 7.0 09/2012.  . Diastolic dysfunction    a. 02/2016 Echo: EF 50-55%, gr1DD, Ao sclerosis, dil Ao root (20mm), sev dil LA, triv TR, PASP 59m.  Hyperlipidemia   . Hypertension   . Noncompliance   . Right carotid bruit    a. noted 01/2018, will need OP eval when patient complies with follow-up.    Assessment: 64 yr old man admitted with CP and rising troponins and started on IV heparin. Initial heparin level was therapeutic at 0.69 unit/ml; H/H WNL this am. Pt reportedly having hematuria - cardiology aware, pending troponin trend, may stop IV heparin today. Will lower heparin level goal in setting of hematuria.  Pt with continued elevated troponins; cath recommended by cardiology, but pt prefers medical treatment first. Current  plan is to continue IV heparin for 48 hrs if possible, unless hematuria is a large problem and Hbg drops.  Heparin level ~6.5 hrs after heparin infusion was decreased to 1250 units/hr was 0.38 units/ml, which is within the goal range for this pt. Per RN, pt is continuing to have hematuria (unchanged from earlier today); no issues with IV.  Goal of Therapy:  Heparin level 0.3-0.5 units/ml Monitor platelets by anticoagulation protocol: Yes   Plan:  Continue heparin infusion at 1250 units/hr Check confirmatory heparin level in 6 hrs Check CBC this evening, due to hematuria Monitor daily heparin level, CBC Monitor for signs/symptoms of bleeding  65, PharmD, BCPS, [facility] Clinical Pharmacist 08/06/2019

## 2019-08-06 NOTE — Progress Notes (Signed)
ANTICOAGULATION CONSULT NOTE - Initial Consult  Pharmacy Consult for Heparin Indication: chest pain/ACS  No Known Allergies  Patient Measurements: Height: 6\' 1"  (185.4 cm) Weight: 115 kg (253 lb 8.5 oz) IBW/kg (Calculated) : 79.9 Heparin Dosing Weight: 105 kg  Vital Signs: Temp: 99 F (37.2 C) (07/09 0106) Temp Source: Oral (07/09 0106) BP: 218/140 (07/09 0515) Pulse Rate: 87 (07/09 0515)  Labs: Recent Labs    08/06/19 0000 08/06/19 0437  HGB 14.5  --   HCT 43.7  --   PLT 225  --   CREATININE 1.50*  --   TROPONINIHS 24* 193*    Estimated Creatinine Clearance: 66.9 mL/min (A) (by C-G formula based on SCr of 1.5 mg/dL (H)).   Medical History: Past Medical History:  Diagnosis Date  . CKD (chronic kidney disease), stage III   . Claudication (HCC)   . Coronary artery disease    a. 08/2001 s/p DES to RCA/RI;  b. 07/2006 s/p DES to p/d LCX;  c. 11/2007 CABGx5 (LIMA->LAD, VG->D1, LRA->OM1, VG->PDA->LPL); c. 03/2015: Synergy DES to mid-Cx in the setting of NSTEMI; d. 11/2015 NSTEMI->Med Rx. e. NSTEMI 01/2018 -> med rx.  . Diabetes mellitus type 2 in nonobese (HCC)    a. A1c 7.0 09/2012.  . Diastolic dysfunction    a. 02/2016 Echo: EF 50-55%, gr1DD, Ao sclerosis, dil Ao root (34mm), sev dil LA, triv TR, PASP 59m.  Hyperlipidemia   . Hypertension   . Noncompliance   . Right carotid bruit    a. noted 01/2018, will need OP eval when patient complies with follow-up.    Medications:  See electronic med rec  Assessment: 64 y.o. M presents with CP. To begin heparin for r/o ACS. No AC PTA. CBC ok on admission.  Goal of Therapy:  Heparin level 0.3-0.7 units/ml Monitor platelets by anticoagulation protocol: Yes   Plan:  Heparin IV bolus 4000 units Heparin gtt at 1500 units/hr Will f/u heparin level in 6 hours Daily heparin level and CBC  65, PharmD, BCPS Please see amion for complete clinical pharmacist phone list 08/06/2019,5:31 AM

## 2019-08-06 NOTE — Progress Notes (Signed)
Pt had a dizzy spell while lab was in room doing blood draw. RN called to room and pt was pale and diaphoretic with no complaints of CP. VS obtained and MD was paged. MD called back with new order for pt and order was implemented. Pt VS stable and resting comfortably with no complaints of pain. Will continue to monitor.

## 2019-08-06 NOTE — Progress Notes (Addendum)
Physician notified: Arlyss Queen  At: [id number]  Regarding: Pt having blood in urine. Pt states this was happening prior to admission. Now on heparin gtt @ 15. Pt poor historian. Bedside nurse aware. Awaiting return response.  TRH MD at bedside. States cardiology to address hematuria.   Cardiology NP Annie Paras at bedside. States primary team to address hematuria.   RN called pharmacist to update. Plan to keep course. H+H stable, heparin level redraw per protocol. Monitor closely, if hematuria increases notify provdier + pharmacy.

## 2019-08-06 NOTE — ED Provider Notes (Signed)
[facility] EMERGENCY DEPARTMENT Provider Note   CSN: [id number] Arrival date & time: 08/05/19  2311     History Chief Complaint  Patient presents with  . Chest Pain    Darrell [name] is a 64 y.o. male.  She presents to the emergency department for evaluation of chest pain.  Patient reports that he has been experiencing central chest pain and shortness of breath that started earlier today, around 8 PM.  Patient comes to the ER by ambulance from home.  He was given aspirin and sublingual nitroglycerin with improvement of his pain.  Patient reports an extensive cardiac history with similar pains.  He was noted to be very hypertensive during transport.  He reports that he ran out of his blood pressure medication and there were no refills so he has been off of it for an unknown amount of time.        Past Medical History:  Diagnosis Date  . CKD (chronic kidney disease), stage III   . Claudication ([employer])   . Coronary artery disease    a. 08/2001 s/p DES to RCA/RI;  b. 07/2006 s/p DES to p/d LCX;  c. 11/2007 CABGx5 (LIMA->LAD, VG->D1, LRA->OM1, VG->PDA->LPL); c. 03/2015: Synergy DES to mid-Cx in the setting of NSTEMI; d. 11/2015 NSTEMI->Med Rx. e. NSTEMI 01/2018 -> med rx.  . Diabetes mellitus type 2 in nonobese ([facility])    a. A1c 7.0 09/2012.  . Diastolic dysfunction    a. 02/2016 Echo: EF 50-55%, gr1DD, Ao sclerosis, dil Ao root (72m), sev dil LA, triv TR, PASP 219mg.  . Marland Kitchenyperlipidemia   . Hypertension   . Noncompliance   . Right carotid bruit    a. noted 01/2018, will need OP eval when patient complies with follow-up.    Patient Active Problem List   Diagnosis Date Noted  . Bleeding gums 08/01/2018  . Chest pain, rule out acute myocardial infarction 07/31/2018  . Renal insufficiency   . ACS (acute coronary syndrome) ([facility]04/05/2018  . Mild anemia 02/08/2018  . CKD (chronic kidney disease), stage III   . Diabetes mellitus type 2 in nonobese (HCC)   .  Hyperlipidemia with target LDL less than 70   . Hypertension   . Right carotid bruit   . Sepsis ([facility]02/03/2016  . Chest pain   . Hyponatremia 01/03/2016  . Hypertensive emergency   . NSTEMI (non-ST elevated myocardial infarction) ([facility]/07/2015  . HTN (hypertension) 04/04/2015  . Hypertensive urgency 03/25/2014  . History of noncompliance with medical treatment, presenting hazards to health 03/25/2014  . Claudication ([facility]10/01/2012  . GERD 10/23/2007  . NEPHROLITHIASIS 10/23/2007  . HYPERCHOLESTEROLEMIA 10/22/2007  . ADJUSTMENT DISORDER WITH DEPRESSED MOOD 10/22/2007  . Type 2 diabetes mellitus without complication, without long-term current use of insulin ([facility]01/01/2003  . Coronary atherosclerosis, s/p CABG in 2009  01/28/2001    Past Surgical History:  Procedure Laterality Date  . CARDIAC CATHETERIZATION N/A 04/06/2015   Procedure: Left Heart Cath and Cors/Grafts Angiography;  Surgeon: JoLorretta HarpMD;  [facility];  Service: Cardiovascular;  Laterality: N/A;  . CARDIAC CATHETERIZATION N/A 04/06/2015   Procedure: Coronary Stent Intervention;  Surgeon: JoLorretta HarpMD;  2.5 mm x 12 mm long Synergy DES followed by  2.5 mm x 16 mm long Synergy DES    . CORONARY ARTERY BYPASS GRAFT     2009 LIMA to LAD, SVG to Diag, SVG to PDA and PL, left radial to OM  .  CORONARY BALLOON ANGIOPLASTY N/A 05/04/2018   Procedure: CORONARY BALLOON ANGIOPLASTY;  Surgeon: Lorretta Harp, MD;  Location: [facility];  Service: Cardiovascular;  Laterality: N/A;  Prox CFX  . LEFT HEART CATH N/A 10/24/2012   Procedure: LEFT HEART CATH;  Surgeon: Troy Sine, MD;  Location: [facility] CATH LAB;  Service: Cardiovascular;  Laterality: N/A;  . LEFT HEART CATH AND CORS/GRAFTS ANGIOGRAPHY N/A 05/04/2018   Procedure: LEFT HEART CATH AND CORS/GRAFTS ANGIOGRAPHY;  Surgeon: Lorretta Harp, MD;  Location: [facility];  Service: Cardiovascular;  Laterality: N/A;       Family  History  Problem Relation Age of Onset  . Heart failure Mother   . Cancer Mother   . CAD Mother 6  . Heart failure Father   . Kidney failure Brother     Social History   Tobacco Use  . Smoking status: Former Smoker    Quit date: 10/25/2010    Years since quitting: 8.7  . Smokeless tobacco: Never Used  Vaping Use  . Vaping Use: Never used  Substance Use Topics  . Alcohol use: No    Comment: sts he drank for the first time in a long time last night   . Drug use: No    Home Medications Prior to Admission medications   Medication Sig Start Date End Date Taking? Authorizing Provider  amLODipine (NORVASC) 10 MG tablet Take 1 tablet (10 mg total) by mouth daily. 04/27/19   Mayers, Cari S, PA-C  amoxicillin (AMOXIL) 500 MG capsule Take 1 capsule (500 mg total) by mouth 3 (three) times daily. 08/07/18   Isla Pence, MD  aspirin 81 MG EC tablet Take 1 tablet (81 mg total) by mouth daily. 03/12/18   [name], MD  atorvastatin (LIPITOR) 80 MG tablet Take 1 tablet (80 mg total) by mouth every evening. 04/27/19   Mayers, Cari S, PA-C  Blood Glucose Monitoring Suppl (TRUE METRIX METER) w/Device KIT USE AS INSTRUCTED 07/05/16   [name], MD  carvedilol (COREG) 25 MG tablet Take 1 tablet (25 mg total) by mouth 2 (two) times daily with a meal. 04/27/19   Mayers, Cari S, PA-C  chlorhexidine (PERIDEX) 0.12 % solution Use as directed 15 mLs in the mouth or throat 2 (two) times daily. 08/07/18   Isla Pence, MD  clopidogrel (PLAVIX) 75 MG tablet Take 1 tablet (75 mg total) by mouth daily. 04/27/19   Mayers, Cari S, PA-C  glucose blood (TRUE METRIX BLOOD GLUCOSE TEST) test strip 1 each by Other route 3 (three) times daily. 04/27/19   Mayers, Cari S, PA-C  isosorbide mononitrate (IMDUR) 120 MG 24 hr tablet Take 1 tablet (120 mg total) by mouth daily. 04/27/19   Mayers, Cari S, PA-C  Lancet Devices (ACCU-CHEK [employer]) lancets Use as instructed daily. 07/05/16   [name], MD  metFORMIN  (GLUCOPHAGE) 500 MG tablet Take 1 tablet (500 mg total) by mouth 2 (two) times daily with a meal. 04/27/19   Mayers, Cari S, PA-C  nitroGLYCERIN (NITROSTAT) 0.4 MG SL tablet Place 1 tablet (0.4 mg total) under the tongue every 5 (five) minutes as needed for chest pain (up to 3 doses. If taking 3rd dose call 911). 04/27/19   Mayers, Cari S, PA-C  TRUEplus Lancets 28G MISC USE AS INSTRUCTED 04/27/19   Mayers, Cari S, PA-C    Allergies    Patient has no known allergies.  Review of Systems   Review of Systems  Respiratory: Positive for  shortness of breath.   Cardiovascular: Positive for chest pain.  All other systems reviewed and are negative.   Physical Exam Updated Vital Signs BP (!) 248/140 (BP Location: Right Arm)   Pulse (!) 103   Temp 99 F (37.2 C) (Oral)   Resp 15   Ht 6' 1" (1.854 m)   Wt 115 kg   SpO2 100%   BMI 33.45 kg/m   Physical Exam Vitals and nursing note reviewed.  Constitutional:      General: He is not in acute distress.    Appearance: Normal appearance. He is well-developed.  HENT:     Head: Normocephalic and atraumatic.     Right Ear: Hearing normal.     Left Ear: Hearing normal.     Nose: Nose normal.  Eyes:     Conjunctiva/sclera: Conjunctivae normal.     Pupils: Pupils are equal, round, and reactive to light.  Cardiovascular:     Rate and Rhythm: Regular rhythm.     Heart sounds: S1 normal and S2 normal. No murmur heard.  No friction rub. No gallop.   Pulmonary:     Effort: Pulmonary effort is normal. No respiratory distress.     Breath sounds: Normal breath sounds.  Chest:     Chest wall: No tenderness.  Abdominal:     General: Bowel sounds are normal.     Palpations: Abdomen is soft.     Tenderness: There is no abdominal tenderness. There is no guarding or rebound. Negative signs include Murphy's sign and McBurney's sign.     Hernia: No hernia is present.  Musculoskeletal:        General: Normal range of motion.     Cervical back: Normal  range of motion and neck supple.     Right lower leg: Edema present.     Left lower leg: Edema present.  Skin:    General: Skin is warm and dry.     Findings: No rash.  Neurological:     Mental Status: He is alert and oriented to person, place, and time.     GCS: GCS eye subscore is 4. GCS verbal subscore is 5. GCS motor subscore is 6.     Cranial Nerves: No cranial nerve deficit.     Sensory: No sensory deficit.     Coordination: Coordination normal.  Psychiatric:        Speech: Speech normal.        Behavior: Behavior normal.        Thought Content: Thought content normal.     ED Results / Procedures / Treatments   Labs (all labs ordered are listed, but only abnormal results are displayed) Labs Reviewed  BASIC METABOLIC PANEL - Abnormal; Notable for the following components:      Result Value   CO2 21 (*)    Glucose, Bld 129 (*)    Creatinine, Ser 1.50 (*)    GFR calc non Af Amer 49 (*)    GFR calc Af Amer 57 (*)    All other components within normal limits  URINALYSIS, ROUTINE W REFLEX MICROSCOPIC - Abnormal; Notable for the following components:   Color, Urine BROWN (*)    APPearance TURBID (*)    Glucose, UA   (*)    Value: TEST NOT REPORTED DUE TO COLOR INTERFERENCE OF URINE PIGMENT   Hgb urine dipstick   (*)    Value: TEST NOT REPORTED DUE TO COLOR INTERFERENCE OF URINE PIGMENT   Bilirubin Urine   (*)  Value: TEST NOT REPORTED DUE TO COLOR INTERFERENCE OF URINE PIGMENT   Ketones, ur   (*)    Value: TEST NOT REPORTED DUE TO COLOR INTERFERENCE OF URINE PIGMENT   Protein, ur   (*)    Value: TEST NOT REPORTED DUE TO COLOR INTERFERENCE OF URINE PIGMENT   Nitrite   (*)    Value: TEST NOT REPORTED DUE TO COLOR INTERFERENCE OF URINE PIGMENT   Leukocytes,Ua   (*)    Value: TEST NOT REPORTED DUE TO COLOR INTERFERENCE OF URINE PIGMENT   All other components within normal limits  URINALYSIS, MICROSCOPIC (REFLEX) - Abnormal; Notable for the following components:    Bacteria, UA FEW (*)    All other components within normal limits  TROPONIN I (HIGH SENSITIVITY) - Abnormal; Notable for the following components:   Troponin I (High Sensitivity) 24 (*)    All other components within normal limits  CBC  URINALYSIS, ROUTINE W REFLEX MICROSCOPIC  BRAIN NATRIURETIC PEPTIDE  TROPONIN I (HIGH SENSITIVITY)    EKG EKG Interpretation  Date/Time:  Thursday August 05 2019 23:45:26 EDT Ventricular Rate:  114 PR Interval:  174 QRS Duration: 112 QT Interval:  340 QTC Calculation: 468 R Axis:   82 Text Interpretation: Sinus tachycardia ST-t wave abnormality , new since last study Abnormal ECG Confirmed by Carmin Muskrat [phone]) on 08/05/2019 11:55:12 PM   Radiology DG Chest Port 1 View  Result Date: 08/06/2019 CLINICAL DATA:  Central chest pain and shortness of breath EXAM: PORTABLE CHEST 1 VIEW COMPARISON:  08/07/2018 FINDINGS: Cardiac shadow is enlarged. Postsurgical changes are again seen. The lungs are clear bilaterally. No bony abnormality is seen. IMPRESSION: No acute abnormality noted. Electronically Signed   By: Inez Catalina M.D.   On: 08/06/2019 02:10    Procedures Procedures (including critical care time)  Medications Ordered in ED Medications  sodium chloride flush (NS) 0.9 % injection 3 mL (has no administration in time range)  nitroGLYCERIN 50 mg in dextrose 5 % 250 mL (0.2 mg/mL) infusion (has no administration in time range)    ED Course  I have reviewed the triage vital signs and the nursing notes.  Pertinent labs & imaging results that were available during my care of the patient were reviewed by me and considered in my medical decision making (see chart for details).    MDM Rules/Calculators/A&P                          Patient presents to the emergency department for evaluation of chest pain.  Patient does have an extensive cardiac history as well as history of hypertension, currently off medications because he ran out.  Patient  markedly hypertensive at arrival.  Patient reports improvement of his chest pain after administration of nitroglycerin by EMS.  He has become progressively more hypertensive, now 248/140.  Troponin is slightly elevated at 24, no significant changes on EKG.  This is likely all consistent with hypertensive urgency and will require admission.  CRITICAL CARE Performed by: Orpah Greek   Total critical care time: 30 minutes  Critical care time was exclusive of separately billable procedures and treating other patients.  Critical care was necessary to treat or prevent imminent or life-threatening deterioration.  Critical care was time spent personally by me on the following activities: development of treatment plan with patient and/or surrogate as well as nursing, discussions with consultants, evaluation of patient's response to treatment, examination of patient, obtaining history  from patient or surrogate, ordering and performing treatments and interventions, ordering and review of laboratory studies, ordering and review of radiographic studies, pulse oximetry and re-evaluation of patient's condition.  Final Clinical Impression(s) / ED Diagnoses Final diagnoses:  Hypertensive urgency    Rx / DC Orders ED Discharge Orders    None       Pollina, Gwenyth Allegra, MD 08/06/19 [phone]

## 2019-08-06 NOTE — Progress Notes (Signed)
Pt with continued elevated troponin and after discussing with Dr.Skains plan was to cath.  Pt  Refuses cath wants to do meds instead, explained his previous stent could have re-closed he again restated he wanted meds first, if with meds symptoms continued he would agree to cath.   Dr. Anne Fu recommends IV heparin for 48 hours if possible.  If hematuria is a large problem and drop in hgb then would need to stop.

## 2019-08-06 NOTE — Consult Note (Addendum)
Cardiology Consultation:   Patient ID: Darrell [name] MRN: [id number]; DOB: Jul 23, 1955  Admit date: 08/05/2019 Date of Consult: 08/06/2019  Primary Care Provider: [name], MD [facility] Cardiologist: Minus Breeding, MD  [place] Electrophysiologist:  None    Patient Profile:   Darrell [name] is a 64 y.o. male with a hx of CAD, prior CABG and multiple interventions, HTN, HLD, DM and noncompliance due to financial issues, stents to [place] 2017 and restenosis in 2020 had cutting balloon, atherectomy who is being seen today for the evaluation of chest pain with elevated troponin at the request of Dr. Tamala Julian.  History of Present Illness:   Darrell [name] with HTN, HLD, DM, and now bedbugs, in addition to CAD with hx of CABG and multiple interventions and last cath 04/2018 with new prox native LCX with severe in stent restenosis at 99% and underwent cutting balloon atherectomy.  Also noted the mid to distal AV groove LCX stent was occluded whereas in 2017 it was subtotally occluded.  His distal LAD beyond LIMA insertion was diffusely and severely diseased as well.  Last echo 01/2018 with EF 55-60% moderate concentric hypertrophy, G2DD LA moderately dilated and RA mildly dilated.  Pt never came for follow up.    Today he presents with chest pain.  No nausea, no SOB mild diaphoresis. No radiation.  Was watching TV when it started.   Though on OV with IM he was having CP in March while off meds.  In ER was given ASA and NTG with improvement of pain.  He was hypertensive.     BP pk 248/140 P 92  He tells me he ha been walking for exercise and no chest pain with this.  Also now with hematuria.    EKG:  The EKG was personally reviewed and demonstrates:  SR with deeper T wave inversion II,III, AVF and V3-6 though rate is increased as well.  Telemetry:  Telemetry was personally reviewed and demonstrates:  SR  Na 139 K+ 4.2, Cr 1.50  BNP 223 Troponin 24 and 193 Hgb 14.5, HCT 43.7 pts  225  PCXR no acute abnormality   BP now 152/102 P 115  No chest pain now.  No chest pain until last pm watching TV.  He has been out of his meds for 1 week.  No COVID infection over last year and no vaccine.    Past Medical History:  Diagnosis Date  . CKD (chronic kidney disease), stage III   . Claudication ([place])   . Coronary artery disease    a. 08/2001 s/p DES to RCA/RI;  b. 07/2006 s/p DES to p/d LCX;  c. 11/2007 CABGx5 (LIMA->LAD, VG->D1, LRA->OM1, VG->PDA->LPL); c. 03/2015: Synergy DES to mid-Cx in the setting of NSTEMI; d. 11/2015 NSTEMI->Med Rx. e. NSTEMI 01/2018 -> med rx.  . Diabetes mellitus type 2 in nonobese ([place])    a. A1c 7.0 09/2012.  . Diastolic dysfunction    a. 02/2016 Echo: EF 50-55%, gr1DD, Ao sclerosis, dil Ao root (30m), sev dil LA, triv TR, PASP 223mg.  . Marland Kitchenyperlipidemia   . Hypertension   . Noncompliance   . Right carotid bruit    a. noted 01/2018, will need OP eval when patient complies with follow-up.    Past Surgical History:  Procedure Laterality Date  . CARDIAC CATHETERIZATION N/A 04/06/2015   Procedure: Left Heart Cath and Cors/Grafts Angiography;  Surgeon: JoLorretta HarpMD;  [facility];  Service: Cardiovascular;  Laterality: N/A;  .  CARDIAC CATHETERIZATION N/A 04/06/2015   Procedure: Coronary Stent Intervention;  Surgeon: Lorretta Harp, MD;  2.5 mm x 12 mm long Synergy DES followed by  2.5 mm x 16 mm long Synergy DES    . CORONARY ARTERY BYPASS GRAFT     2009 LIMA to LAD, SVG to Diag, SVG to PDA and PL, left radial to OM  . CORONARY BALLOON ANGIOPLASTY N/A 05/04/2018   Procedure: CORONARY BALLOON ANGIOPLASTY;  Surgeon: Lorretta Harp, MD;  Location: [place] [facility];  Service: Cardiovascular;  Laterality: N/A;  Prox CFX  . LEFT HEART CATH N/A 10/24/2012   Procedure: LEFT HEART CATH;  Surgeon: Troy Sine, MD;  Location: [facility];  Service: Cardiovascular;  Laterality: N/A;  . LEFT HEART CATH AND CORS/GRAFTS ANGIOGRAPHY N/A  05/04/2018   Procedure: LEFT HEART CATH AND CORS/GRAFTS ANGIOGRAPHY;  Surgeon: Lorretta Harp, MD;  Location: [place] [facility];  Service: Cardiovascular;  Laterality: N/A;     Home Medications:  Prior to Admission medications   Medication Sig Start Date End Date Taking? Authorizing Provider  aspirin 81 MG EC tablet Take 1 tablet (81 mg total) by mouth daily. 03/12/18  Yes [name], MD  amLODipine (NORVASC) 10 MG tablet Take 1 tablet (10 mg total) by mouth daily. 04/27/19   Mayers, Cari S, PA-C  amoxicillin (AMOXIL) 500 MG capsule Take 1 capsule (500 mg total) by mouth 3 (three) times daily. Patient not taking: Reported on 08/06/2019 08/07/18   Isla Pence, MD  atorvastatin (LIPITOR) 80 MG tablet Take 1 tablet (80 mg total) by mouth every evening. 04/27/19   Mayers, Cari S, PA-C  Blood Glucose Monitoring Suppl (TRUE METRIX METER) w/Device KIT USE AS INSTRUCTED 07/05/16   [name], MD  carvedilol (COREG) 25 MG tablet Take 1 tablet (25 mg total) by mouth 2 (two) times daily with a meal. 04/27/19   Mayers, Cari S, PA-C  chlorhexidine (PERIDEX) 0.12 % solution Use as directed 15 mLs in the mouth or throat 2 (two) times daily. Patient not taking: Reported on 08/06/2019 08/07/18   Isla Pence, MD  clopidogrel (PLAVIX) 75 MG tablet Take 1 tablet (75 mg total) by mouth daily. 04/27/19   Mayers, Cari S, PA-C  glucose blood (TRUE METRIX BLOOD GLUCOSE TEST) test strip 1 each by Other route 3 (three) times daily. 04/27/19   Mayers, Cari S, PA-C  isosorbide mononitrate (IMDUR) 120 MG 24 hr tablet Take 1 tablet (120 mg total) by mouth daily. 04/27/19   Mayers, Cari S, PA-C  Lancet Devices (ACCU-CHEK [employer]) lancets Use as instructed daily. 07/05/16   [name], MD  metFORMIN (GLUCOPHAGE) 500 MG tablet Take 1 tablet (500 mg total) by mouth 2 (two) times daily with a meal. 04/27/19   Mayers, Cari S, PA-C  nitroGLYCERIN (NITROSTAT) 0.4 MG SL tablet Place 1 tablet (0.4 mg total) under the tongue  every 5 (five) minutes as needed for chest pain (up to 3 doses. If taking 3rd dose call 911). 04/27/19   Mayers, Cari S, PA-C  TRUEplus Lancets 28G MISC USE AS INSTRUCTED 04/27/19   Mayers, Loraine Grip, PA-C   NO MEDS  Inpatient Medications: Scheduled Meds: . sodium chloride flush  3 mL Intravenous Once   Continuous Infusions: . heparin 1,500 Units/hr (08/06/19 0622)  . nitroGLYCERIN 30 mcg/min (08/06/19 0744)   PRN Meds: acetaminophen, alum & mag hydroxide-simeth, ondansetron (ZOFRAN) IV  Allergies:   No Known Allergies  Social History:   Social History  Socioeconomic History  . Marital status: Married    Spouse name: Not on file  . Number of children: 3  . Years of education: Not on file  . Highest education level: Not on file  Occupational History  . Not on file  Tobacco Use  . Smoking status: Former Smoker    Quit date: 10/25/2010    Years since quitting: 8.7  . Smokeless tobacco: Never Used  Vaping Use  . Vaping Use: Never used  Substance and Sexual Activity  . Alcohol use: No    Comment: sts he drank for the first time in a long time last night   . Drug use: No  . Sexual activity: Not on file  Other Topics Concern  . Not on file  Social History Narrative   Lives at home with wife.     Social Determinants of Health   Financial Resource Strain:   . Difficulty of Paying Living Expenses:   Food Insecurity:   . Worried About Charity fundraiser in the Last Year:   . Arboriculturist in the Last Year:   Transportation Needs:   . Film/video editor (Medical):   Marland Kitchen Lack of Transportation (Non-Medical):   Physical Activity:   . Days of Exercise per Week:   . Minutes of Exercise per Session:   Stress:   . Feeling of Stress :   Social Connections:   . Frequency of Communication with Friends and Family:   . Frequency of Social Gatherings with Friends and Family:   . Attends Religious Services:   . Active Member of Clubs or Organizations:   . Attends Theatre manager Meetings:   Marland Kitchen Marital Status:   Intimate Partner Violence:   . Fear of Current or Ex-Partner:   . Emotionally Abused:   Marland Kitchen Physically Abused:   . Sexually Abused:     Family History:    Family History  Problem Relation Age of Onset  . Heart failure Mother   . Cancer Mother   . CAD Mother 26  . Heart failure Father   . Kidney failure Brother      ROS:  Please see the history of present illness.  General:no colds or fevers, no weight changes Skin:no rashes or ulcers HEENT:no blurred vision, no congestion CV:see HPI PUL:see HPI GI:no diarrhea constipation or melena, no indigestion GU:no hematuria, no dysuria MS:no joint pain, no claudication Neuro:no syncope, no lightheadedness Endo:no diabetes, no thyroid disease  All other ROS reviewed and negative.     Physical Exam/Data:   Vitals:   08/06/19 0615 08/06/19 0715 08/06/19 0730 08/06/19 0738  BP: (!) 212/128 (!) 199/120 (!) 192/116 (!) 192/116  Pulse: 97 94 90 90  Resp:    16  Temp:      TempSrc:      SpO2: 95% 95% 95% 95%  Weight:      Height:       No intake or output data in the 24 hours ending 08/06/19 0845 Last 3 Weights 08/05/2019 08/01/2018 07/31/2018  Weight (lbs) 253 lb 8.5 oz 220 lb 14.4 oz 221 lb 9.6 oz  Weight (kg) 115 kg 100.2 kg 100.517 kg     Body mass index is 33.45 kg/m.  General:  Well nourished, well developed, in no acute distress HEENT: normal Lymph: no adenopathy Neck: no JVD Endocrine:  No thryomegaly Vascular: No carotid bruits; pedal pulses 2+ bilaterally  Cardiac:  normal S1, S2; RRR; no murmur gallup rub or  click Lungs:  clear to auscultation bilaterally, no wheezing, rhonchi or rales  Abd: soft, nontender, no hepatomegaly  Ext: no edema Musculoskeletal:  No deformities, BUE and BLE strength normal and equal Skin: warm and dry  Neuro:  Alert and oriented X 3 MAE follows commands, no focal abnormalities noted Psych:  Normal affect    Relevant CV Studies: Cardiac cath  05/04/2018 Diagnostic Dominance: Right  Intervention     IMPRESSION: Darrell [name] had anatomy fairly similar to what it was in 2017.  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  The mid to distal AV groove circumflex stent was occluded at this point whereas 3 years ago it was subtotally occluded.  His distal LAD beyond LIMA insertion was diffusely and severely diseased as well.  I performed Cutting Balloon atherectomy reducing a 99% lesion to less than 30%.  Patient tolerated procedure well.  He was already on aspirin Plavix.  Angiomax was turned off.  He left the lab in stable condition on IV nitroglycerin for blood pressure control.  The sheath will be removed and pressure held.  Patient will be gently hydrated and most likely discharged home tomorrow on aspirin and Plavix  ECHO 02/06/2018  Study Conclusions   - Procedure narrative: Transthoracic echocardiography. Image  quality was poor. The study was technically difficult, as a  result of poor sound wave transmission.  - Left ventricle: The cavity size was normal. There was moderate  concentric hypertrophy. Systolic function was normal. The  estimated ejection fraction was in the range of 55% to 60%.  Hypokinesis of the entireinferior myocardium; consistent with  ischemia or infarction in the distribution of the right coronary  artery. Features are consistent with a pseudonormal left  ventricular filling pattern, with concomitant abnormal relaxation  and increased filling pressure (grade 2 diastolic dysfunction).  - Left atrium: The atrium was moderately dilated.  - Right atrium: The atrium was mildly dilated.    Laboratory Data:  High Sensitivity Troponin:   Recent Labs  Lab 08/06/19 0000 08/06/19 0437  TROPONINIHS 24* 193*     Chemistry Recent Labs  Lab 08/06/19 0000  NA 139  K 4.2  CL 108  CO2 21*  GLUCOSE 129*  BUN 16  CREATININE  1.50*  CALCIUM 9.0  GFRNONAA 49*  GFRAA 57*  ANIONGAP 10    No results for input(s): PROT, ALBUMIN, AST, ALT, ALKPHOS, BILITOT in the last 168 hours. Hematology Recent Labs  Lab 08/06/19 0000  WBC 7.5  RBC 5.04  HGB 14.5  HCT 43.7  MCV 86.7  MCH 28.8  MCHC 33.2  RDW 14.2  PLT 225   BNP Recent Labs  Lab 08/06/19 0437  BNP 223.5*    DDimer No results for input(s): DDIMER in the last 168 hours.   Radiology/Studies:  DG Chest Port 1 View  Result Date: 08/06/2019 CLINICAL DATA:  Central chest pain and shortness of breath EXAM: PORTABLE CHEST 1 VIEW COMPARISON:  08/07/2018 FINDINGS: Cardiac shadow is enlarged. Postsurgical changes are again seen. The lungs are clear bilaterally. No bony abnormality is seen. IMPRESSION: No acute abnormality noted. Electronically Signed   By: Inez Catalina M.D.   On: 08/06/2019 02:10       HEAR Score (for undifferentiated chest pain):     4     Assessment and Plan:   1. Chest pain with elevated troponin to 193 with known CAD.  Pt was also hypertensive on arrival to  ER and now on IV NTG drip.  He was also given amlodipine.  Will repeat troponin.  May be due to HTN, he had been walking with exercise and no pain. Echo pending.  With hx of restenosis in native LCX possible need for cath will defer to Dr. Marlou Porch- recheck troponin if flat stop heparin  Resume home meds.   2. CAD wit hx of CABG and cutting balloon angioplasty 04/2018. VG to RCA 100% stenosed and hx of DES to LCX in 2017.  Distal LCX occluded  3. HTN elevated, had been off meds.  Continue BB had been out of meds for 1 week 4. Hematuria currently on IV heparin will defer to Dr. Marlou Porch  5. HLD off statin, check lipids and resume.  Money is an issue.  6. Bed bugs per IM       For questions or updates, please contact [place] Please consult [email or web address] for contact info under    Signed, Cecilie Kicks, NP  08/06/2019 8:45 AM  Personally seen and examined. Agree with  above.   64 year old with severe multivessel coronary artery disease post CABG and multiple stents who is been noncompliant because of financial issues here with hypertensive urgency, mildly elevated troponin.  Not currently having any chest pain.  GEN: Well nourished, well developed, in no acute distress, slightly disheveled HEENT: normal  Neck: no JVD, carotid bruits, or masses Cardiac: RRR; no murmurs, rubs, or gallops,no edema  Respiratory:  clear to auscultation bilaterally, normal work of breathing GI: soft, nontender, nondistended, + BS MS: no deformity or atrophy  Skin: warm and dry, no rash Neuro:  Alert and Oriented x 3, Strength and sensation are intact Psych: euthymic mood, full affect   -We will go ahead and and check 1 more troponin.  If it is significantly elevated, likely proceed with repeat angiography.  If it is relatively flat, continue with current return to medical management. -In regards to hematuria, if troponin does not significantly elevate, we will likely stop IV heparin.  We will continue to follow.  Candee Furbish, MD

## 2019-08-06 NOTE — Progress Notes (Signed)
ANTICOAGULATION CONSULT NOTE  Pharmacy Consult for Heparin Indication: chest pain/ACS  No Known Allergies  Patient Measurements: Height: 6\' 1"  (185.4 cm) Weight: 103 kg (227 lb 1.2 oz) IBW/kg (Calculated) : 79.9 Heparin Dosing Weight: 105 kg  Vital Signs: Temp: 99 F (37.2 C) (07/09 0106) Temp Source: Oral (07/09 0106) BP: 167/107 (07/09 1030) Pulse Rate: 92 (07/09 1030)  Labs: Recent Labs    08/06/19 0000 08/06/19 0437 08/06/19 1010  HGB 14.5  --   --   HCT 43.7  --   --   PLT 225  --   --   HEPARINUNFRC  --   --  0.69  CREATININE 1.50*  --   --   TROPONINIHS 24* 193*  --     Estimated Creatinine Clearance: 63.5 mL/min (A) (by C-G formula based on SCr of 1.5 mg/dL (H)).   Medical History: Past Medical History:  Diagnosis Date   CKD (chronic kidney disease), stage III    Claudication ([facility])    Coronary artery disease    a. 08/2001 s/p DES to RCA/RI;  b. 07/2006 s/p DES to p/d LCX;  c. 11/2007 CABGx5 (LIMA->LAD, VG->D1, LRA->OM1, VG->PDA->LPL); c. 03/2015: Synergy DES to mid-Cx in the setting of NSTEMI; d. 11/2015 NSTEMI->Med Rx. e. NSTEMI 01/2018 -> med rx.   Diabetes mellitus type 2 in nonobese [facility])    a. A1c 7.0 09/2012.   Diastolic dysfunction    a. 02/2016 Echo: EF 50-55%, gr1DD, Ao sclerosis, dil Ao root (79mm), sev dil LA, triv TR, PASP 59m.   Hyperlipidemia    Hypertension    Noncompliance    Right carotid bruit    a. noted 01/2018, will need OP eval when patient complies with follow-up.     Assessment: 50 yoM admitted with CP and rising troponins. Pt started on IV heparin. Initial heparin level therapeutic at 0.69, H/H wnl this am. Pt reportedly having hematuria - cardiology aware, pending troponin trend may stop IV heparin today. Will lower heparin level goal for now in setting of hematuria.  Goal of Therapy:  Heparin level 0.3-0.5 units/ml Monitor platelets by anticoagulation protocol: Yes   Plan:  -Reduce heparin to 1250  units/h -Recheck heparin level in 6h   65, PharmD, BCPS Clinical Pharmacist [phone] Please check AMION for all [facility] Pharm[facility] numbers 08/06/2019

## 2019-08-06 NOTE — H&P (Addendum)
History and Physical    Darrell [name] EXB:[id number] DOB: Apr 01, 1955 DOA: 08/05/2019  Referring MD/NP/PA: Gean Birchwood, MD PCP: [name], MD  Patient coming from: Home via EMS  Chief Complaint: Chest pain  I have personally briefly reviewed patient's old medical records in [place]   HPI: Darrell [name] is a 64 y.o. male with medical history significant of HTN, HLD, CAD s/p CABG, diastolic CHF last EF 10-27% with grade 2 diastolic dysfunction, chronic kidney disease stage III, diabetes mellitus type 2, and noncompliance presents with complaints of chest pain which started last night while watching TV.  Pain was located substernally and sharp.  Complained of associated symptoms of diaphoresis.  Denied any nausea, vomiting, or shortness of breath.  He has been without his medication for approximately 1 week now after refills ran out.Also incidentally report having some hematuria and burning discomfort with urination.  Patient does not smoke or drink any alcohol.  Last cardiac cath was in 04/2018 by Dr. Alvester Chou which showed multivessel disease able to undergo successful PCI of proximal circumflex with arthrectomy resulting in reduction of a 99% in-stent restenosis to less than 30% residual.  In route with EMS patient received 324 mg of aspirin and 1 nitroglycerin sublingual prior to arrival.  Patient was reported to have bedbugs.    ED Course: Upon arrival to ED emergency department patient was noted to be afebrile with heart rate 80-108, blood pressures reportedly elevated up to 256/126, and all other vital signs maintained.  Labs significant for troponin 24->193 and BNP 223.5.  Chest x-ray showed enlarged cardiac silhouette but no other acute abnormalities.  EKG reported similar to previous.  Urinalysis was significant for hematuria. has been started on a nitroglycerin drip for blood pressure control, given amlodipine 10 mg, and started on a heparin drip with bolus.  TRH  called to admit.  Review of Systems  Constitutional: Positive for diaphoresis. Negative for fever.  HENT: Negative for congestion and sinus pain.   Eyes: Negative for photophobia and pain.  Respiratory: Negative for shortness of breath.   Cardiovascular: Positive for chest pain.  Gastrointestinal: Negative for abdominal pain, nausea and vomiting.  Genitourinary: Positive for dysuria and hematuria.  Skin: Negative for rash.  Neurological: Negative for focal weakness and loss of consciousness.  Psychiatric/Behavioral: Negative for memory loss and substance abuse.    Past Medical History:  Diagnosis Date  . CKD (chronic kidney disease), stage III   . Claudication ([place])   . Coronary artery disease    a. 08/2001 s/p DES to RCA/RI;  b. 07/2006 s/p DES to p/d LCX;  c. 11/2007 CABGx5 (LIMA->LAD, VG->D1, LRA->OM1, VG->PDA->LPL); c. 03/2015: Synergy DES to mid-Cx in the setting of NSTEMI; d. 11/2015 NSTEMI->Med Rx. e. NSTEMI 01/2018 -> med rx.  . Diabetes mellitus type 2 in nonobese ([place])    a. A1c 7.0 09/2012.  . Diastolic dysfunction    a. 02/2016 Echo: EF 50-55%, gr1DD, Ao sclerosis, dil Ao root (46m), sev dil LA, triv TR, PASP 220mg.  . Marland Kitchenyperlipidemia   . Hypertension   . Noncompliance   . Right carotid bruit    a. noted 01/2018, will need OP eval when patient complies with follow-up.    Past Surgical History:  Procedure Laterality Date  . CARDIAC CATHETERIZATION N/A 04/06/2015   Procedure: Left Heart Cath and Cors/Grafts Angiography;  Surgeon: JoLorretta HarpMD;  Location: [facility];  Service: Cardiovascular;  Laterality: N/A;  . CARDIAC CATHETERIZATION N/A 04/06/2015  Procedure: Coronary Stent Intervention;  Surgeon: Lorretta Harp, MD;  2.5 mm x 12 mm long Synergy DES followed by  2.5 mm x 16 mm long Synergy DES    . CORONARY ARTERY BYPASS GRAFT     2009 LIMA to LAD, SVG to Diag, SVG to PDA and PL, left radial to OM  . CORONARY BALLOON ANGIOPLASTY N/A 05/04/2018    Procedure: CORONARY BALLOON ANGIOPLASTY;  Surgeon: Lorretta Harp, MD;  Location: [facility];  Service: Cardiovascular;  Laterality: N/A;  Prox CFX  . LEFT HEART CATH N/A 10/24/2012   Procedure: LEFT HEART CATH;  Surgeon: Troy Sine, MD;  Location: [facility];  Service: Cardiovascular;  Laterality: N/A;  . LEFT HEART CATH AND CORS/GRAFTS ANGIOGRAPHY N/A 05/04/2018   Procedure: LEFT HEART CATH AND CORS/GRAFTS ANGIOGRAPHY;  Surgeon: Lorretta Harp, MD;  Location: [facility];  Service: Cardiovascular;  Laterality: N/A;     reports that he quit smoking about 8 years ago. He has never used smokeless tobacco. He reports that he does not drink alcohol and does not use drugs.  No Known Allergies  Family History  Problem Relation Age of Onset  . Heart failure Mother   . Cancer Mother   . CAD Mother 55  . Heart failure Father   . Kidney failure Brother     Prior to Admission medications   Medication Sig Start Date End Date Taking? Authorizing Provider  amLODipine (NORVASC) 10 MG tablet Take 1 tablet (10 mg total) by mouth daily. 04/27/19   Mayers, Cari S, PA-C  amoxicillin (AMOXIL) 500 MG capsule Take 1 capsule (500 mg total) by mouth 3 (three) times daily. 08/07/18   Isla Pence, MD  aspirin 81 MG EC tablet Take 1 tablet (81 mg total) by mouth daily. 03/12/18   [name], MD  atorvastatin (LIPITOR) 80 MG tablet Take 1 tablet (80 mg total) by mouth every evening. 04/27/19   Mayers, Cari S, PA-C  Blood Glucose Monitoring Suppl (TRUE METRIX METER) w/Device KIT USE AS INSTRUCTED 07/05/16   [name], MD  carvedilol (COREG) 25 MG tablet Take 1 tablet (25 mg total) by mouth 2 (two) times daily with a meal. 04/27/19   Mayers, Cari S, PA-C  chlorhexidine (PERIDEX) 0.12 % solution Use as directed 15 mLs in the mouth or throat 2 (two) times daily. 08/07/18   Isla Pence, MD  clopidogrel (PLAVIX) 75 MG tablet Take 1 tablet (75 mg total) by mouth daily. 04/27/19   Mayers,  Cari S, PA-C  glucose blood (TRUE METRIX BLOOD GLUCOSE TEST) test strip 1 each by Other route 3 (three) times daily. 04/27/19   Mayers, Cari S, PA-C  isosorbide mononitrate (IMDUR) 120 MG 24 hr tablet Take 1 tablet (120 mg total) by mouth daily. 04/27/19   Mayers, Cari S, PA-C  Lancet Devices (ACCU-CHEK [employer]) lancets Use as instructed daily. 07/05/16   [name], MD  metFORMIN (GLUCOPHAGE) 500 MG tablet Take 1 tablet (500 mg total) by mouth 2 (two) times daily with a meal. 04/27/19   Mayers, Cari S, PA-C  nitroGLYCERIN (NITROSTAT) 0.4 MG SL tablet Place 1 tablet (0.4 mg total) under the tongue every 5 (five) minutes as needed for chest pain (up to 3 doses. If taking 3rd dose call 911). 04/27/19   Mayers, Cari S, PA-C  TRUEplus Lancets 28G MISC USE AS INSTRUCTED 04/27/19   Mayers, Loraine Grip, PA-C    Physical Exam:  Constitutional: NAD, calm, comfortable Vitals:  08/06/19 0515 08/06/19 0530 08/06/19 0600 08/06/19 0615  BP: (!) 218/140 (!) 215/134 (!) 203/132 (!) 212/128  Pulse: 87 96 96 97  Resp:      Temp:      TempSrc:      SpO2: 95% 94% 95% 95%  Weight:      Height:       Eyes: PERRL, lids and conjunctivae normal ENMT: Mucous membranes are moist. Posterior pharynx clear of any exudate or lesions.Normal dentition.  Neck: normal, supple, no masses, no thyromegaly Respiratory: clear to auscultation bilaterally, no wheezing, no crackles. Normal respiratory effort. No accessory muscle use.  Cardiovascular: Regular rate and rhythm, no murmurs / rubs / gallops. No extremity edema. 2+ pedal pulses. No carotid bruits.  Abdomen: no tenderness, no masses palpated. No hepatosplenomegaly. Bowel sounds positive.  Musculoskeletal: no clubbing / cyanosis. No joint deformity upper and lower extremities. Good ROM, no contractures. Normal muscle tone.  Skin: no rashes, lesions, ulcers. No induration Neurologic: CN 2-12 grossly intact. Sensation intact, DTR normal. Strength 5/5 in all 4.  Psychiatric:  Normal judgment and insight. Alert and oriented x 3. Normal mood.     Labs on Admission: I have personally reviewed following labs and imaging studies  CBC: Recent Labs  Lab 08/06/19 0000  WBC 7.5  HGB 14.5  HCT 43.7  MCV 86.7  PLT 225   Basic Metabolic Panel: Recent Labs  Lab 08/06/19 0000  NA 139  K 4.2  CL 108  CO2 21*  GLUCOSE 129*  BUN 16  CREATININE 1.50*  CALCIUM 9.0   GFR: Estimated Creatinine Clearance: 66.9 mL/min (A) (by C-G formula based on SCr of 1.5 mg/dL (H)). Liver Function Tests: No results for input(s): AST, ALT, ALKPHOS, BILITOT, PROT, ALBUMIN in the last 168 hours. No results for input(s): LIPASE, AMYLASE in the last 168 hours. No results for input(s): AMMONIA in the last 168 hours. Coagulation Profile: No results for input(s): INR, PROTIME in the last 168 hours. Cardiac Enzymes: No results for input(s): CKTOTAL, CKMB, CKMBINDEX, TROPONINI in the last 168 hours. BNP (last 3 results) No results for input(s): PROBNP in the last 8760 hours. HbA1C: No results for input(s): HGBA1C in the last 72 hours. CBG: No results for input(s): GLUCAP in the last 168 hours. Lipid Profile: No results for input(s): CHOL, HDL, LDLCALC, TRIG, CHOLHDL, LDLDIRECT in the last 72 hours. Thyroid Function Tests: No results for input(s): TSH, T4TOTAL, FREET4, T3FREE, THYROIDAB in the last 72 hours. Anemia Panel: No results for input(s): VITAMINB12, FOLATE, FERRITIN, TIBC, IRON, RETICCTPCT in the last 72 hours. Urine analysis:    Component Value Date/Time   COLORURINE BROWN (A) 08/05/2019 2340   APPEARANCEUR TURBID (A) 08/05/2019 2340   LABSPEC  08/05/2019 2340    TEST NOT REPORTED DUE TO COLOR INTERFERENCE OF URINE PIGMENT   PHURINE  08/05/2019 2340    TEST NOT REPORTED DUE TO COLOR INTERFERENCE OF URINE PIGMENT   GLUCOSEU (A) 08/05/2019 2340    TEST NOT REPORTED DUE TO COLOR INTERFERENCE OF URINE PIGMENT   HGBUR (A) 08/05/2019 2340    TEST NOT REPORTED DUE TO  COLOR INTERFERENCE OF URINE PIGMENT   HGBUR large 10/22/2007 0921   BILIRUBINUR (A) 08/05/2019 2340    TEST NOT REPORTED DUE TO COLOR INTERFERENCE OF URINE PIGMENT   KETONESUR (A) 08/05/2019 2340    TEST NOT REPORTED DUE TO COLOR INTERFERENCE OF URINE PIGMENT   PROTEINUR (A) 08/05/2019 2340    TEST NOT REPORTED DUE TO COLOR  INTERFERENCE OF URINE PIGMENT   UROBILINOGEN 1.0 10/25/2012 0240   NITRITE (A) 08/05/2019 2340    TEST NOT REPORTED DUE TO COLOR INTERFERENCE OF URINE PIGMENT   LEUKOCYTESUR (A) 08/05/2019 2340    TEST NOT REPORTED DUE TO COLOR INTERFERENCE OF URINE PIGMENT   Sepsis Labs: Recent Results (from the past 240 hour(s))  SARS Coronavirus 2 by RT PCR (hospital order, performed in [place] hospital lab) Nasopharyngeal Nasopharyngeal Swab     Status: None   Collection Time: 08/06/19  4:34 AM   Specimen: Nasopharyngeal Swab  Result Value Ref Range Status   SARS Coronavirus 2 NEGATIVE NEGATIVE Final    Comment: (NOTE) SARS-CoV-2 target nucleic acids are NOT DETECTED.  The SARS-CoV-2 RNA is generally detectable in upper and lower respiratory specimens during the acute phase of infection. The lowest concentration of SARS-CoV-2 viral copies this assay can detect is 250 copies / mL. A negative result does not preclude SARS-CoV-2 infection and should not be used as the sole basis for treatment or other patient management decisions.  A negative result may occur with improper specimen collection / handling, submission of specimen other than nasopharyngeal swab, presence of viral mutation(s) within the areas targeted by this assay, and inadequate number of viral copies (<250 copies / mL). A negative result must be combined with clinical observations, patient history, and epidemiological information.  Fact Sheet for Patients:   StrictlyIdeas.no  Fact Sheet for Healthcare Providers: BankingDealers.co.za  This test is not yet  approved or  cleared by the [place] FDA and has been authorized for detection and/or diagnosis of SARS-CoV-2 by FDA under an Emergency Use Authorization (EUA).  This EUA will remain in effect (meaning this test can be used) for the duration of the COVID-19 declaration under Section 564(b)(1) of the Act, 21 U.S.C. section 360bbb-3(b)(1), unless the authorization is terminated or revoked sooner.  Performed at [facility], [place] [address] [place], [place] [address]      Radiological Exams on Admission: DG Chest Port 1 View  Result Date: 08/06/2019 CLINICAL DATA:  Central chest pain and shortness of breath EXAM: PORTABLE CHEST 1 VIEW COMPARISON:  08/07/2018 FINDINGS: Cardiac shadow is enlarged. Postsurgical changes are again seen. The lungs are clear bilaterally. No bony abnormality is seen. IMPRESSION: No acute abnormality noted. Electronically Signed   By: Inez Catalina M.D.   On: 08/06/2019 02:10    EKG: Independently reviewed.  114 bpm with ST wave changes  Assessment/Plan  Chest pain with elevated troponin: Acute.  Patient presented with complaints of centralized chest pain and recently running out of home blood pressure medications.  High-sensitivity troponin 24->1 98.  Heart score equals 4.   -Admit to a progressive bed -Continue heparin drip per pharmacy -Continue nitroglycerin drip -Add-on lipid panel -Check echocardiogram -Appreciate cardiology consultative services, and will follow-up for further recommendation   Hypertensive emergency: Acute.  Blood pressures initially elevated into the 200s with elevated troponins.  Home blood pressure medications include amlodipine 10 mg daily, Coreg 25 mg twice daily with meals, and isosorbide mononitrate and 25 mg daily -Placed on nitroglycerin drip blood pressures.   CAD: Patient with prior history of CABG and multiple interventions in the past.  Last heart catheterization in 04/2018 by Dr. Alvester Chou with successful PCI of  proximal circumflex with arthrectomy resulting in reduction of a 99% in-stent restenosis to less than 30% residual. -Continue Plavix, aspirin, and statin  Hematuria: Acute.  Patient with gross bloody urine noted on physical  exam.  Hemoglobin appears to be stable -Check renal ultrasound  Diabetes mellitus type 2: Last hemoglobin A1c 5.2 on 02/05/2018.  Home medications include 2 g twice daily.  Patient appears to be relatively well controlled. -Hypoglycemic protocols -Add-on hemoglobin A1c  -Hold Metformin -CBGs before every meal with sensitive SSI  Chronic kidney disease stage IIIa: On admission patient's creatinine noted to be 1.5 which appears normal patient baseline which ranged from 1.4 - 1.5. -Continue to monitor  Hyperlipidemia:  Home medications include atorvastatin 80 mg daily. -Follow-up lipid panel -Continue atorvastatin  Bedbugs: Noted prior to arrival.  DVT prophylaxis: Heparin Code Status: Full Family Communication: Family requested to be updated at this time Disposition Plan: Possible discharge home in 1 to 3 days Consults called: Cardiology Admission status: Inpatient   Norval Morton MD [facility] Pager [phone]   If 7PM-7AM, please contact night-coverage [email or web address] Password [email or web address]  08/06/2019, 7:09 AM

## 2019-08-06 NOTE — Plan of Care (Signed)
  Problem: Clinical Measurements: Goal: Ability to maintain clinical measurements within normal limits will improve Outcome: Progressing Goal: Will remain free from infection Outcome: Progressing Goal: Respiratory complications will improve Outcome: Progressing Goal: Cardiovascular complication will be avoided Outcome: Progressing   Problem: Pain Managment: Goal: General experience of comfort will improve Outcome: Progressing   Problem: Safety: Goal: Ability to remain free from injury will improve Outcome: Progressing   

## 2019-08-07 DIAGNOSIS — R319 Hematuria, unspecified: Secondary | ICD-10-CM | POA: Diagnosis present

## 2019-08-07 DIAGNOSIS — I214 Non-ST elevation (NSTEMI) myocardial infarction: Principal | ICD-10-CM

## 2019-08-07 DIAGNOSIS — I209 Angina pectoris, unspecified: Secondary | ICD-10-CM

## 2019-08-07 LAB — HEPARIN LEVEL (UNFRACTIONATED)
Heparin Unfractionated: 0.41 IU/mL (ref 0.30–0.70)
Heparin Unfractionated: 0.4 IU/mL (ref 0.30–0.70)

## 2019-08-07 LAB — GLUCOSE, CAPILLARY
Glucose-Capillary: 112 mg/dL — ABNORMAL HIGH (ref 70–99)
Glucose-Capillary: 129 mg/dL — ABNORMAL HIGH (ref 70–99)
Glucose-Capillary: 157 mg/dL — ABNORMAL HIGH (ref 70–99)
Glucose-Capillary: 128 mg/dL — ABNORMAL HIGH (ref 70–99)

## 2019-08-07 LAB — CBC
HCT: 36.5 % — ABNORMAL LOW (ref 39.0–52.0)
Hemoglobin: 12.1 g/dL — ABNORMAL LOW (ref 13.0–17.0)
MCH: 28.9 pg (ref 26.0–34.0)
MCHC: 33.2 g/dL (ref 30.0–36.0)
MCV: 87.3 fL (ref 80.0–100.0)
Platelets: 198 10*3/uL (ref 150–400)
RBC: 4.18 MIL/uL — ABNORMAL LOW (ref 4.22–5.81)
RDW: 14.5 % (ref 11.5–15.5)
WBC: 8.4 10*3/uL (ref 4.0–10.5)
nRBC: 0 % (ref 0.0–0.2)

## 2019-08-07 LAB — URINE CULTURE

## 2019-08-07 MED ORDER — TAMSULOSIN HCL 0.4 MG PO CAPS
0.4000 mg | ORAL_CAPSULE | Freq: Every day | ORAL | Status: DC
  Administered 2019-08-07 – 2019-08-08 (×2): 0.4 mg via ORAL
  Filled 2019-08-07 (×2): qty 1

## 2019-08-07 NOTE — Progress Notes (Signed)
Patient has hematuria with infusing heparin gtt. Urine was wine color. Notified Dr. Jerral Ralph regarding this and Dr. Jerral Ralph aware of this and ordered stop heparin gtt. Stopped heparin. Dr. Jerral Ralph ordered that patient need to ambulate for evaluation of any chest pain. NT [place] walked with him and patient tolerated well without any complications. Dr. Jerral Ralph ordered checking bladder scan after void. Patient voided 70 ml then checked bladder scan, 20 ml. Continue to monitor urine output and bladder scan every 8 hrs. HS [employer]

## 2019-08-07 NOTE — Progress Notes (Signed)
Dr. Jerral Ralph called wife, but no one pick up the phone. 3rd bladder scan done and patient voided 125 ml and residual was 112 ml. Sent urine culture. HS [employer]

## 2019-08-07 NOTE — Progress Notes (Signed)
ANTICOAGULATION CONSULT NOTE - Follow Up Consult  Pharmacy Consult for heparin Indication: chest pain/ACS  Labs: Recent Labs    08/06/19 0000 08/06/19 0437 08/06/19 0952 08/06/19 1010 08/06/19 1801 08/06/19 2138 08/07/19 0047  HGB 14.5  --   --   --   --  12.5*  --   HCT 43.7  --   --   --   --  38.4*  --   PLT 225  --   --   --   --  205  --   HEPARINUNFRC  --   --   --  0.69 0.38  --  0.41  CREATININE 1.50*  --   --   --   --   --   --   TROPONINIHS 24* 193* 289*  --   --   --   --     Assessment/Plan:  64yo male remains therapeutic on heparin. Will continue gtt at current rate and monitor daily level.   Vernard Gambles, PharmD, BCPS  08/07/2019,1:23 AM

## 2019-08-07 NOTE — Plan of Care (Signed)

## 2019-08-07 NOTE — Progress Notes (Signed)
PROGRESS NOTE    Darrell [name]  PPI:[id number] DOB: Oct 30, 1955 DOA: 08/05/2019 PCP: Hoy Register, MD    Brief Narrative:  Darrell [name] is a 64 y.o. male with medical history significant of HTN, HLD, CAD s/p CABG, diastolic CHF last EF 55-60% with grade 2 diastolic dysfunction, chronic kidney disease stage III, diabetes mellitus type 2, and noncompliance presents with complaints of chest pain which started last night while watching TV. [facility] and did not have any refills and he stated that he was unable to take his heart medications for 1 week.  Also has some hematuria.  Has extensive history of coronary artery disease.  Last cardiac cath was done on 04/2018 showed multivessel disease, PCI of proximal circumflex with atherectomy. In the emergency department, patient was afebrile.  Heart rate was stable.  Blood pressure 256/126.  Troponins were 24-193.  Started on nitroglycerin drip for blood pressure control, given amlodipine, started on heparin drip with bolus and admitted to the hospital.   Assessment & Plan:   Principal Problem:   Ischemic chest pain (HCC) Active Problems:   Type 2 diabetes mellitus without complication, without long-term current use of insulin (HCC)   HYPERCHOLESTEROLEMIA   Hypertensive urgency   Non-ST elevation (NSTEMI) myocardial infarction (HCC)   Hematuria  Non-STEMI: In a patient with extensive cardiovascular history, presence of stent and without medications. Currently stabilized.  Chest pain-free.  Followed by cardiology. Started on heparin, symptomatically improved discontinue. Patient wants to have conservative management, he does not want to have cardiac cath at this time. Aspirin and Plavix resumed.  Blood pressures adequate and chest pain improved, discontinue nitro. Resume beta-blockers and isosorbide mononitrate. Will mobilize and monitor.  Hematuria: Denies prostatism.  Intermittent hematuria for a long time.  Renal  ultrasound with severe right-sided hydronephrosis that may be chronic.  Will check postvoid residual urine.  Discontinue heparin.  Hemoglobin is stable.  If significant retention, will discuss with urology.  Diabetes type 2, known A1c of 5.2.  On Metformin at home.  Currently on sliding scale insulin.  Continue.  Chronic kidney disease stage IIIa: Historical baseline creatinine about 1.4-1.5.  At about his baseline.  We will continue to monitor.  Hyperlipidemia: Statin resumed.  Bedbug: Hygiene.   DVT prophylaxis: Heparin infusion   Code Status: Full code Family Communication: None Disposition Plan: Status is: Inpatient  Remains inpatient appropriate because:Inpatient level of care appropriate due to severity of illness   Dispo: The patient is from: Home              Anticipated d/c is to: Home              Anticipated d/c date is: 2 days              Patient currently is not medically stable to d/c.         Consultants:   Cardiology  Procedures:   None  Antimicrobials:   None   Subjective: Patient seen and examined.  Currently denies any chest pain or shortness of breath.  He thinks that he will be all right once he resumes his home medications.  Complains of dark cloudy urine with blood, aggravated by heparin use.  Denies any urinary retention or prostatism.  Objective: Vitals:   08/07/19 0300 08/07/19 0900 08/07/19 0902 08/07/19 1042  BP: 139/85 (!) 157/89 (!) 157/89 (!) 131/92  Pulse: 96 81  81  Resp: 17 20  19   Temp: 98.4 F (36.9 C)  98 F (36.7 C)  98.4 F (36.9 C)  TempSrc: Oral Oral  Oral  SpO2: 98% 99%  96%  Weight:      Height:        Intake/Output Summary (Last 24 hours) at 08/07/2019 1124 Last data filed at 08/07/2019 1044 Gross per 24 hour  Intake 712.6 ml  Output 750 ml  Net -37.4 ml   Filed Weights   08/05/19 2333 08/06/19 0914  Weight: 115 kg 103 kg    Examination:  General exam: Appears calm and comfortable, not in any  distress. Respiratory system: Clear to auscultation. Respiratory effort normal. Cardiovascular system: S1 & S2 heard, RRR.  No edema.   Gastrointestinal system: Abdomen is nondistended, soft and nontender.  Central nervous system: Alert and oriented. No focal neurological deficits. Extremities: Symmetric 5 x 5 power. Skin: No rashes, lesions or ulcers Psychiatry: Judgement and insight appear normal. Mood & affect appropriate.  Urinal with dark-colored urine.   Data Reviewed: I have personally reviewed following labs and imaging studies  CBC: Recent Labs  Lab 08/06/19 0000 08/06/19 2138 08/07/19 0211  WBC 7.5 7.6 8.4  HGB 14.5 12.5* 12.1*  HCT 43.7 38.4* 36.5*  MCV 86.7 87.1 87.3  PLT 225 205 198   Basic Metabolic Panel: Recent Labs  Lab 08/06/19 0000  NA 139  K 4.2  CL 108  CO2 21*  GLUCOSE 129*  BUN 16  CREATININE 1.50*  CALCIUM 9.0   GFR: Estimated Creatinine Clearance: 63.5 mL/min (A) (by C-G formula based on SCr of 1.5 mg/dL (H)). Liver Function Tests: No results for input(s): AST, ALT, ALKPHOS, BILITOT, PROT, ALBUMIN in the last 168 hours. No results for input(s): LIPASE, AMYLASE in the last 168 hours. No results for input(s): AMMONIA in the last 168 hours. Coagulation Profile: No results for input(s): INR, PROTIME in the last 168 hours. Cardiac Enzymes: No results for input(s): CKTOTAL, CKMB, CKMBINDEX, TROPONINI in the last 168 hours. BNP (last 3 results) No results for input(s): PROBNP in the last 8760 hours. HbA1C: Recent Labs    08/06/19 1010  HGBA1C 5.9*   CBG: Recent Labs  Lab 08/06/19 2111 08/06/19 2141 08/07/19 0615  GLUCAP 152* 149* 112*   Lipid Profile: Recent Labs    08/06/19 0952  CHOL 167  HDL 31*  LDLCALC 120*  TRIG 81  CHOLHDL 5.4   Thyroid Function Tests: No results for input(s): TSH, T4TOTAL, FREET4, T3FREE, THYROIDAB in the last 72 hours. Anemia Panel: No results for input(s): VITAMINB12, FOLATE, FERRITIN, TIBC,  IRON, RETICCTPCT in the last 72 hours. Sepsis Labs: No results for input(s): PROCALCITON, LATICACIDVEN in the last 168 hours.  Recent Results (from the past 240 hour(s))  Urine Culture     Status: Abnormal   Collection Time: 08/06/19  2:26 AM   Specimen: Urine, Random  Result Value Ref Range Status   Specimen Description URINE, RANDOM  Final   Special Requests   Final    NONE Performed at [facility], [address] [place], [place] [address]    Culture MULTIPLE SPECIES PRESENT, SUGGEST RECOLLECTION (A)  Final   Report Status 08/07/2019 FINAL  Final  SARS Coronavirus 2 by RT PCR (hospital order, performed in [facility] hospital lab) Nasopharyngeal Nasopharyngeal Swab     Status: None   Collection Time: 08/06/19  4:34 AM   Specimen: Nasopharyngeal Swab  Result Value Ref Range Status   SARS Coronavirus 2 NEGATIVE NEGATIVE Final    Comment: (NOTE) SARS-CoV-2 target  nucleic acids are NOT DETECTED.  The SARS-CoV-2 RNA is generally detectable in upper and lower respiratory specimens during the acute phase of infection. The lowest concentration of SARS-CoV-2 viral copies this assay can detect is 250 copies / mL. A negative result does not preclude SARS-CoV-2 infection and should not be used as the sole basis for treatment or other patient management decisions.  A negative result may occur with improper specimen collection / handling, submission of specimen other than nasopharyngeal swab, presence of viral mutation(s) within the areas targeted by this assay, and inadequate number of viral copies (<250 copies / mL). A negative result must be combined with clinical observations, patient history, and epidemiological information.  Fact Sheet for Patients:   BoilerBrush.com.cyhttps://www.fda.gov/media/136312/download  Fact Sheet for Healthcare Providers: https://pope.com/https://www.fda.gov/media/136313/download  This test is not yet approved or  cleared by the [place] FDA and has been authorized for  detection and/or diagnosis of SARS-CoV-2 by FDA under an Emergency Use Authorization (EUA).  This EUA will remain in effect (meaning this test can be used) for the duration of the COVID-19 declaration under Section 564(b)(1) of the Act, 21 U.S.C. section 360bbb-3(b)(1), unless the authorization is terminated or revoked sooner.  Performed at [facility], [address] [place], [place] [address]   MRSA PCR Screening     Status: None   Collection Time: 08/06/19  9:45 AM   Specimen: Nasal Mucosa; Nasopharyngeal  Result Value Ref Range Status   MRSA by PCR NEGATIVE NEGATIVE Final    Comment:        The GeneXpert MRSA Assay (FDA approved for NASAL specimens only), is one component of a comprehensive MRSA colonization surveillance program. It is not intended to diagnose MRSA infection nor to guide or monitor treatment for MRSA infections. Performed at [facility], [address] [place], [place] [address]          Radiology Studies: US RENAL  Result Date: 08/06/2019 CLINICAL DATA:  Hematuria EXAM: RENAL / URINARY TRACT ULTRASOUND COMPLETE COMPARISON:  None. FINDINGS: Right Kidney: Renal measurements: 12.3 x 7.5 x 8.2 cm = volume: 397 mL. There is severe right hydronephrosis there is superimposed mild to moderate renal cortical atrophy and diffusely increased renal cortical echogenicity suggesting changes of underlying medical renal disease. No intrarenal masses or calcifications are clearly identified. Left Kidney: Renal measurements: 10.6 x 5.1 x 7.5 cm = volume: 211 mL. There is no hydronephrosis identified. There is marked asymmetric cortical atrophy involving the upper pole of the left kidney. Cortical thickness of the lower pole appears preserved and renal cortical echogenicity is normal. No intrarenal masses or calcifications are seen. Bladder: The bladder is partially decompressed. A small amount of layering debris is seen within the bladder lumen. There is mild  circumferential bladder wall thickening, finding that may reflect changes of chronic bladder outlet obstruction. Other: None. IMPRESSION: 1. Severe right hydronephrosis. Superimposed asymmetric right renal cortical atrophy and cortical echogenicity suggests that this may be longstanding in nature. 2. Asymmetric cortical atrophy involving the upper pole of the left kidney 3. Circumferential mild bladder wall thickening, possibly related to changes of chronic bladder outlet obstruction. Electronically Signed   By: Helyn NumbersAshesh  Parikh MD   On: 08/06/2019 17:21   DG Chest Port 1 View  Result Date: 08/06/2019 CLINICAL DATA:  Central chest pain and shortness of breath EXAM: PORTABLE CHEST 1 VIEW COMPARISON:  08/07/2018 FINDINGS: Cardiac shadow is enlarged. Postsurgical changes are again seen. The lungs are clear bilaterally. No  bony abnormality is seen. IMPRESSION: No acute abnormality noted. Electronically Signed   By: Alcide Clever M.D.   On: 08/06/2019 02:10   ECHOCARDIOGRAM COMPLETE  Result Date: 08/06/2019    ECHOCARDIOGRAM REPORT   Patient Name:   JASKIRAT SCHWIEGER Date of Exam: 08/06/2019 Medical Rec #:  [id number]         Height:       73.0 in Accession #:    [id number]        Weight:       227.1 lb Date of Birth:  Oct 06, 1955         BSA:          2.271 m Patient Age:    64 years          BP:           167/107 mmHg Patient Gender: M                 HR:           92 bpm. Exam Location:  Inpatient Procedure: 2D Echo and Intracardiac Opacification Agent Indications:    Chest Pain 786.50 / R07.9  History:        Patient has prior history of Echocardiogram examinations, most                 recent 02/06/2018. CHF, CAD, Prior CABG; Risk                 Factors:Hypertension, Dyslipidemia and Diabetes. Chronic kidney                 disease.  Sonographer:    Leta Jungling RDCS Referring Phys: [id number] RONDELL A SMITH  Sonographer Comments: Technically difficult study due to poor echo windows. IMPRESSIONS  1. Left ventricular  ejection fraction, by estimation, is 65 to 70%. The left ventricle has normal function. The left ventricle has no regional wall motion abnormalities. There is severe left ventricular hypertrophy. Indeterminate diastolic filling due to E-A fusion.  2. Right ventricular systolic function is normal. The right ventricular size is normal.  3. The mitral valve is abnormal. Trivial mitral valve regurgitation.  4. The aortic valve is abnormal. Aortic valve regurgitation is not visualized. Mild aortic valve sclerosis is present, with no evidence of aortic valve stenosis. Comparison(s): No significant change from prior study. The left ventricular function is unchanged. FINDINGS  Left Ventricle: Left ventricular ejection fraction, by estimation, is 65 to 70%. The left ventricle has normal function. The left ventricle has no regional wall motion abnormalities. Definity contrast agent was given IV to delineate the left ventricular  endocardial borders. The left ventricular internal cavity size was normal in size. There is severe left ventricular hypertrophy. Indeterminate diastolic filling due to E-A fusion. Right Ventricle: The right ventricular size is normal. No increase in right ventricular wall thickness. Right ventricular systolic function is normal. Left Atrium: Left atrial size was normal in size. Right Atrium: Right atrial size was normal in size. Pericardium: There is no evidence of pericardial effusion. Mitral Valve: The mitral valve is abnormal. Mild mitral annular calcification. Trivial mitral valve regurgitation. Tricuspid Valve: The tricuspid valve is grossly normal. Tricuspid valve regurgitation is trivial. Aortic Valve: The aortic valve is abnormal. Aortic valve regurgitation is not visualized. Mild aortic valve sclerosis is present, with no evidence of aortic valve stenosis. Pulmonic Valve: The pulmonic valve was normal in structure. Pulmonic valve regurgitation is not visualized. Aorta: The aortic root is  normal in size  and structure. IAS/Shunts: No atrial level shunt detected by color flow Doppler.  LEFT VENTRICLE PLAX 2D LVIDd:         4.00 cm  Diastology LVIDs:         3.10 cm  LV e' lateral:   7.18 cm/s LV PW:         1.80 cm  LV E/e' lateral: 4.2 LV IVS:        1.90 cm  LV e' medial:    3.92 cm/s LVOT diam:     2.30 cm  LV E/e' medial:  7.8 LV SV:         44 LV SV Index:   19 LVOT Area:     4.15 cm  LEFT ATRIUM             Index LA diam:        4.10 cm 1.81 cm/m LA Vol (A2C):   49.0 ml 21.58 ml/m LA Vol (A4C):   51.9 ml 22.85 ml/m LA Biplane Vol: 50.8 ml 22.37 ml/m  AORTIC VALVE LVOT Vmax:   79.00 cm/s LVOT Vmean:  54.100 cm/s LVOT VTI:    0.106 m  AORTA Ao Root diam: 4.60 cm MITRAL VALVE MV Area (PHT): 8.25 cm    SHUNTS MV Decel Time: 92 msec     Systemic VTI:  0.11 m MV E velocity: 30.40 cm/s  Systemic Diam: 2.30 cm MV A velocity: 60.00 cm/s MV E/A ratio:  0.51 Dietrich Pates MD Electronically signed by Dietrich Pates MD Signature Date/Time: 08/06/2019/4:23:22 PM    Final         Scheduled Meds:  amLODipine  10 mg Oral Daily   aspirin  81 mg Oral Daily   atorvastatin  80 mg Oral QPM   carvedilol  25 mg Oral BID WC   clopidogrel  75 mg Oral Daily   insulin aspart  0-9 Units Subcutaneous TID WC   isosorbide mononitrate  120 mg Oral Daily   sodium chloride flush  3 mL Intravenous Once   Continuous Infusions:   LOS: 1 day    Time spent: 30 minutes    Dorcas Carrow, MD [facility] Pager [phone]

## 2019-08-07 NOTE — Progress Notes (Signed)
Progress Note  Patient Name: Darrell [name] Date of Encounter: 08/07/2019  [facility] Cardiologist: [name], MD   Subjective   The patient is chest pain free this morning.  Inpatient Medications    Scheduled Meds: . amLODipine  10 mg Oral Daily  . aspirin  81 mg Oral Daily  . atorvastatin  80 mg Oral QPM  . carvedilol  25 mg Oral BID WC  . clopidogrel  75 mg Oral Daily  . insulin aspart  0-9 Units Subcutaneous TID WC  . isosorbide mononitrate  120 mg Oral Daily  . sodium chloride flush  3 mL Intravenous Once   Continuous Infusions: . heparin 1,250 Units/hr (08/07/19 0300)   PRN Meds: acetaminophen, alum & mag hydroxide-simeth, ondansetron (ZOFRAN) IV   Vital Signs    Vitals:   08/06/19 2328 08/07/19 0300 08/07/19 0900 08/07/19 0902  BP: 121/77 139/85 (!) 157/89 (!) 157/89  Pulse: 68 96 81   Resp: (!) 23 17 20    Temp: 98.4 F (36.9 C) 98.4 F (36.9 C) 98 F (36.7 C)   TempSrc: Oral Oral Oral   SpO2: 96% 98% 99%   Weight:      Height:        Intake/Output Summary (Last 24 hours) at 08/07/2019 0931 Last data filed at 08/07/2019 0900 Gross per 24 hour  Intake 712.6 ml  Output 550 ml  Net 162.6 ml   Last 3 Weights 08/06/2019 08/05/2019 08/01/2018  Weight (lbs) 227 lb 1.2 oz 253 lb 8.5 oz 220 lb 14.4 oz  Weight (kg) 103 kg 115 kg 100.2 kg      Telemetry    SR in low 100' - Personally Reviewed  ECG    No new tracing - Personally Reviewed  Physical Exam  Poor dentition, disheveled GEN: No acute distress.   Neck: No JVD Cardiac: RRR, no murmurs, rubs, or gallops.  Respiratory: Clear to auscultation bilaterally. GI: Soft, nontender, non-distended  MS: No edema; No deformity. Neuro:  Nonfocal  Psych: Normal affect   Labs    High Sensitivity Troponin:   Recent Labs  Lab 08/06/19 0000 08/06/19 0437 08/06/19 0952  TROPONINIHS 24* 193* 289*     Chemistry Recent Labs  Lab 08/06/19 0000  NA 139  K 4.2  CL 108  CO2 21*  GLUCOSE  129*  BUN 16  CREATININE 1.50*  CALCIUM 9.0  GFRNONAA 49*  GFRAA 57*  ANIONGAP 10    Hematology Recent Labs  Lab 08/06/19 0000 08/06/19 2138 08/07/19 0211  WBC 7.5 7.6 8.4  RBC 5.04 4.41 4.18*  HGB 14.5 12.5* 12.1*  HCT 43.7 38.4* 36.5*  MCV 86.7 87.1 87.3  MCH 28.8 28.3 28.9  MCHC 33.2 32.6 33.2  RDW 14.2 14.4 14.5  PLT 225 205 198   BNP Recent Labs  Lab 08/06/19 0437  BNP 223.5*    DDimer No results for input(s): DDIMER in the last 168 hours.   Radiology    10/07/19 RENAL  Result Date: 08/06/2019 CLINICAL DATA:  Hematuria EXAM: RENAL / URINARY TRACT ULTRASOUND COMPLETE COMPARISON:  None. FINDINGS: Right Kidney: Renal measurements: 12.3 x 7.5 x 8.2 cm = volume: 397 mL. There is severe right hydronephrosis there is superimposed mild to moderate renal cortical atrophy and diffusely increased renal cortical echogenicity suggesting changes of underlying medical renal disease. No intrarenal masses or calcifications are clearly identified. Left Kidney: Renal measurements: 10.6 x 5.1 x 7.5 cm = volume: 211 mL. There is no hydronephrosis identified. There  is marked asymmetric cortical atrophy involving the upper pole of the left kidney. Cortical thickness of the lower pole appears preserved and renal cortical echogenicity is normal. No intrarenal masses or calcifications are seen. Bladder: The bladder is partially decompressed. A small amount of layering debris is seen within the bladder lumen. There is mild circumferential bladder wall thickening, finding that may reflect changes of chronic bladder outlet obstruction. Other: None. IMPRESSION: 1. Severe right hydronephrosis. Superimposed asymmetric right renal cortical atrophy and cortical echogenicity suggests that this may be longstanding in nature. 2. Asymmetric cortical atrophy involving the upper pole of the left kidney 3. Circumferential mild bladder wall thickening, possibly related to changes of chronic bladder outlet obstruction.  Electronically Signed   By: Helyn Numbers MD   On: 08/06/2019 17:21   DG Chest Port 1 View  Result Date: 08/06/2019 CLINICAL DATA:  Central chest pain and shortness of breath EXAM: PORTABLE CHEST 1 VIEW COMPARISON:  08/07/2018 FINDINGS: Cardiac shadow is enlarged. Postsurgical changes are again seen. The lungs are clear bilaterally. No bony abnormality is seen. IMPRESSION: No acute abnormality noted. Electronically Signed   By: Alcide Clever M.D.   On: 08/06/2019 02:10   ECHOCARDIOGRAM COMPLETE  Result Date: 08/06/2019    ECHOCARDIOGRAM REPORT   Patient Name:   Darrell [name] Date of Exam: 08/06/2019 Medical Rec #:  [id number]         Height:       73.0 in Accession #:    [id number]        Weight:       227.1 lb Date of Birth:  06-Apr-1955         BSA:          2.271 m Patient Age:    64 years          BP:           167/107 mmHg Patient Gender: M                 HR:           92 bpm. Exam Location:  Inpatient Procedure: 2D Echo and Intracardiac Opacification Agent Indications:    Chest Pain 786.50 / R07.9  History:        Patient has prior history of Echocardiogram examinations, most                 recent 02/06/2018. CHF, CAD, Prior CABG; Risk                 Factors:Hypertension, Dyslipidemia and Diabetes. Chronic kidney                 disease.  Sonographer:    Leta Jungling RDCS Referring Phys: [id number] RONDELL A SMITH  Sonographer Comments: Technically difficult study due to poor echo windows. IMPRESSIONS  1. Left ventricular ejection fraction, by estimation, is 65 to 70%. The left ventricle has normal function. The left ventricle has no regional wall motion abnormalities. There is severe left ventricular hypertrophy. Indeterminate diastolic filling due to E-A fusion.  2. Right ventricular systolic function is normal. The right ventricular size is normal.  3. The mitral valve is abnormal. Trivial mitral valve regurgitation.  4. The aortic valve is abnormal. Aortic valve regurgitation is not visualized. Mild  aortic valve sclerosis is present, with no evidence of aortic valve stenosis. Comparison(s): No significant change from prior study. The left ventricular function is unchanged. FINDINGS  Left Ventricle: Left ventricular ejection fraction, by estimation, is  65 to 70%. The left ventricle has normal function. The left ventricle has no regional wall motion abnormalities. Definity contrast agent was given IV to delineate the left ventricular  endocardial borders. The left ventricular internal cavity size was normal in size. There is severe left ventricular hypertrophy. Indeterminate diastolic filling due to E-A fusion. Right Ventricle: The right ventricular size is normal. No increase in right ventricular wall thickness. Right ventricular systolic function is normal. Left Atrium: Left atrial size was normal in size. Right Atrium: Right atrial size was normal in size. Pericardium: There is no evidence of pericardial effusion. Mitral Valve: The mitral valve is abnormal. Mild mitral annular calcification. Trivial mitral valve regurgitation. Tricuspid Valve: The tricuspid valve is grossly normal. Tricuspid valve regurgitation is trivial. Aortic Valve: The aortic valve is abnormal. Aortic valve regurgitation is not visualized. Mild aortic valve sclerosis is present, with no evidence of aortic valve stenosis. Pulmonic Valve: The pulmonic valve was normal in structure. Pulmonic valve regurgitation is not visualized. Aorta: The aortic root is normal in size and structure. IAS/Shunts: No atrial level shunt detected by color flow Doppler.  LEFT VENTRICLE PLAX 2D LVIDd:         4.00 cm  Diastology LVIDs:         3.10 cm  LV e' lateral:   7.18 cm/s LV PW:         1.80 cm  LV E/e' lateral: 4.2 LV IVS:        1.90 cm  LV e' medial:    3.92 cm/s LVOT diam:     2.30 cm  LV E/e' medial:  7.8 LV SV:         44 LV SV Index:   19 LVOT Area:     4.15 cm  LEFT ATRIUM             Index LA diam:        4.10 cm 1.81 cm/m LA Vol (A2C):   49.0  ml 21.58 ml/m LA Vol (A4C):   51.9 ml 22.85 ml/m LA Biplane Vol: 50.8 ml 22.37 ml/m  AORTIC VALVE LVOT Vmax:   79.00 cm/s LVOT Vmean:  54.100 cm/s LVOT VTI:    0.106 m  AORTA Ao Root diam: 4.60 cm MITRAL VALVE MV Area (PHT): 8.25 cm    SHUNTS MV Decel Time: 92 msec     Systemic VTI:  0.11 m MV E velocity: 30.40 cm/s  Systemic Diam: 2.30 cm MV A velocity: 60.00 cm/s MV E/A ratio:  0.51 Dietrich Pates MD Electronically signed by Dietrich Pates MD Signature Date/Time: 08/06/2019/4:23:22 PM    Final    Cardiac Studies   TTE 08/06/19  1. Left ventricular ejection fraction, by estimation, is 65 to 70%. The  left ventricle has normal function. The left ventricle has no regional  wall motion abnormalities. There is severe left ventricular hypertrophy.  Indeterminate diastolic filling due  to E-A fusion.  2. Right ventricular systolic function is normal. The right ventricular  size is normal.  3. The mitral valve is abnormal. Trivial mitral valve regurgitation.  4. The aortic valve is abnormal. Aortic valve regurgitation is not  visualized. Mild aortic valve sclerosis is present, with no evidence of  aortic valve stenosis.   Comparison(s): No significant change from prior study. The left  ventricular function is unchanged.    Patient Profile     64 y.o. male with a hx of CAD, prior CABG and multiple interventions, HTN, HLD, DM and noncompliance due  to financial issues, stents to pLCX 2017 and restenosis in 2020 had cutting balloon, atherectomy who is being seen today for the evaluation of chest pain with elevated troponin at the request of Dr. Katrinka BlazingSmith, in ER severely hypertensive 248/140 mmHg.  Mr. Pollie MeyerMcIntyre with HTN, HLD, DM, and now bedbugs, in addition to CAD with hx of CABG and multiple interventions and last cath 04/2018 with new prox native LCX with severe in stent restenosis at 99% and underwent cutting balloon atherectomy.  Also noted the mid to distal AV groove LCX stent was occluded whereas in  2017 it was subtotally occluded.  His distal LAD beyond LIMA insertion was diffusely and severely diseased as well.  Assessment & Plan    1. Chest pain with elevated troponin to 193-->289 with known CAD.  Pt was also hypertensive on arrival to ER and now on IV NTG drip.  He was also given amlodipine.  Sec to hypertensive urgency, now asymptomatic with improved BP, he had been walking with exercise and no pain. Echo with hyperdynamic LVEF 65-70% and no new WMA.   2. CAD wit hx of CABG and cutting balloon angioplasty 04/2018. VG to RCA 100% stenosed and hx of DES to LCX in 2017.  Distal LCX occluded  3. HTN improved 4. Hematuria currently on IV heparin, Hb stable, we will d/c Heparin  5. HLD off statin, check lipids and resume.  Money is an issue.  6. Bed bugs per IM   For questions or updates, please contact [facility] Please consult [email or web address] for contact info under     Signed, Tobias AlexanderKatarina Katerina Zurn, MD  08/07/2019, 9:31 AM

## 2019-08-08 LAB — CBC
HCT: 35.6 % — ABNORMAL LOW (ref 39.0–52.0)
Hemoglobin: 11.7 g/dL — ABNORMAL LOW (ref 13.0–17.0)
MCH: 28.7 pg (ref 26.0–34.0)
MCHC: 32.9 g/dL (ref 30.0–36.0)
MCV: 87.5 fL (ref 80.0–100.0)
Platelets: 180 10*3/uL (ref 150–400)
RBC: 4.07 MIL/uL — ABNORMAL LOW (ref 4.22–5.81)
RDW: 14.6 % (ref 11.5–15.5)
WBC: 6.4 10*3/uL (ref 4.0–10.5)
nRBC: 0 % (ref 0.0–0.2)

## 2019-08-08 LAB — BASIC METABOLIC PANEL
Anion gap: 9 (ref 5–15)
BUN: 34 mg/dL — ABNORMAL HIGH (ref 8–23)
CO2: 23 mmol/L (ref 22–32)
Calcium: 8.9 mg/dL (ref 8.9–10.3)
Chloride: 103 mmol/L (ref 98–111)
Creatinine, Ser: 1.86 mg/dL — ABNORMAL HIGH (ref 0.61–1.24)
GFR calc Af Amer: 44 mL/min — ABNORMAL LOW (ref >60)
GFR calc non Af Amer: 38 mL/min — ABNORMAL LOW (ref >60)
Glucose, Bld: 134 mg/dL — ABNORMAL HIGH (ref 70–99)
Potassium: 4.5 mmol/L (ref 3.5–5.1)
Sodium: 135 mmol/L (ref 135–145)

## 2019-08-08 LAB — URINE CULTURE
Culture: <10000 — AB
Special Requests: NORMAL

## 2019-08-08 LAB — HEPARIN LEVEL (UNFRACTIONATED): Heparin Unfractionated: <0.1 IU/mL — ABNORMAL LOW (ref 0.30–0.70)

## 2019-08-08 LAB — GLUCOSE, CAPILLARY
Glucose-Capillary: 128 mg/dL — ABNORMAL HIGH (ref 70–99)
Glucose-Capillary: 126 mg/dL — ABNORMAL HIGH (ref 70–99)

## 2019-08-08 LAB — PHOSPHORUS: Phosphorus: 3.8 mg/dL (ref 2.5–4.6)

## 2019-08-08 LAB — MAGNESIUM: Magnesium: 1.9 mg/dL (ref 1.7–2.4)

## 2019-08-08 MED ORDER — ISOSORBIDE MONONITRATE ER 120 MG PO TB24: 120.0000 mg | ORAL_TABLET | Freq: Every day | ORAL | 0 refills | Status: DC

## 2019-08-08 MED ORDER — LABETALOL HCL 5 MG/ML IV SOLN
10.0000 mg | INTRAVENOUS | Status: DC | PRN
  Administered 2019-08-08: 10 mg via INTRAVENOUS
  Filled 2019-08-08: qty 4

## 2019-08-08 MED ORDER — NITROGLYCERIN 0.4 MG SL SUBL: 0.4000 mg | SUBLINGUAL_TABLET | SUBLINGUAL | 0 refills | Status: DC | PRN

## 2019-08-08 MED ORDER — METFORMIN HCL 500 MG PO TABS: 500.0000 mg | ORAL_TABLET | Freq: Two times a day (BID) | ORAL | 0 refills | Status: DC

## 2019-08-08 MED ORDER — CLOPIDOGREL BISULFATE 75 MG PO TABS: 75.0000 mg | ORAL_TABLET | Freq: Every day | ORAL | 0 refills | Status: DC

## 2019-08-08 MED ORDER — ATORVASTATIN CALCIUM 80 MG PO TABS: 80.0000 mg | ORAL_TABLET | Freq: Every evening | ORAL | 0 refills | Status: DC

## 2019-08-08 MED ORDER — CARVEDILOL 25 MG PO TABS: 25.0000 mg | ORAL_TABLET | Freq: Two times a day (BID) | ORAL | 0 refills | Status: DC

## 2019-08-08 MED ORDER — HYDRALAZINE HCL 25 MG PO TABS: 25.0000 mg | ORAL_TABLET | Freq: Two times a day (BID) | ORAL | 0 refills | Status: DC

## 2019-08-08 MED ORDER — AMLODIPINE BESYLATE 10 MG PO TABS: 10.0000 mg | ORAL_TABLET | Freq: Every day | ORAL | 0 refills | Status: DC

## 2019-08-08 MED ORDER — ASPIRIN 81 MG PO CHEW: 81.0000 mg | CHEWABLE_TABLET | Freq: Every day | ORAL | 2 refills | Status: AC

## 2019-08-08 MED ORDER — TAMSULOSIN HCL 0.4 MG PO CAPS: 0.4000 mg | ORAL_CAPSULE | Freq: Every day | ORAL | 0 refills | Status: DC

## 2019-08-08 MED ORDER — HYDRALAZINE HCL 25 MG PO TABS
25.0000 mg | ORAL_TABLET | Freq: Two times a day (BID) | ORAL | Status: DC
  Administered 2019-08-08: 25 mg via ORAL
  Filled 2019-08-08: qty 1

## 2019-08-08 NOTE — Discharge Summary (Signed)
Physician Discharge Summary  Darrell [name] ZOX:[id number] DOB: 1955/06/20 DOA: 08/05/2019  PCP: [name], MD  Admit date: 08/05/2019 Discharge date: 08/08/2019  Admitted From: Home Disposition: Home  Recommendations for Outpatient Follow-up:  1. Follow-up with wellness center tomorrow. 2. We will send a referral to urology for follow-up.  Discharge Condition: Stable CODE STATUS: Full code Diet recommendation: Low-salt low-carb diet  Discharge summary: Darrell Manus McIntyreis a 64 y.o.malewith medical history significant ofHTN, HLD, CAD s/p CABG,diastolic CHF last EF 81-19% with grade 2 diastolic dysfunction, chronic kidney disease stage III, diabetes mellitus type 2, and noncompliance presents with complaints of chest painwhich started last night while watching TV. Goes to wellness center and did not have any refills and he stated that he was unable to take his heart medications for 1 week.  Also has some hematuria.  Has extensive history of coronary artery disease.  Last cardiac cath was done on 04/2018 showed multivessel disease, PCI of proximal circumflex with atherectomy. In the emergency department, patient was afebrile.  Heart rate was stable.  Blood pressure 256/126.  Troponins were 24-193.  Started on nitroglycerin drip for blood pressure control, given amlodipine, started on heparin drip with bolus and admitted to the hospital.  He was admitted to the hospital and treated with different medications and also seen by cardiology.  NSTEMI:In a patient with extensive cardiovascular history, presence of stent and without medications. Currently stabilized.  Chest pain-free.  Followed by cardiology. Chest pain and troponin elevation thought to be due to uncontrolled hypertension.   Patient wants to have conservative management, he does not want to have cardiac cath at this time. Resume aspirin, Plavix, nitrates, added hydralazine.  On beta-blockers.   Added hydralazine for better  blood pressure control.   Received all his medications today in the hospital before discharge, will go to wellness center tomorrow to collect his prescriptions.    Hematuria: Patient with intermittent hematuria.  Does complain of difficulty starting urination.  Denies any history of instrumentation or prostate problems. Urine is clearing up today. Renal ultrasound with severe right-sided hydronephrosis reported as possibly chronic. Urinating without any post void residual. Discussed with patient about management of hematuria, he wants to go home today and follow-up outpatient, will send outpatient neurology referral.  His blood sugars are well controlled.  A1c is 5.2.  Resume Metformin.  His kidney functions are at about his baseline which is 1.5-1.8.  He is also started on atorvastatin. Patient was found with bedbugs, lives in a motel and hopefully multiple management can help him get rid of bedbugs in their rooms.  Discharge Diagnoses:  Principal Problem:   Ischemic chest pain ([place]) Active Problems:   Type 2 diabetes mellitus without complication, without long-term current use of insulin (H[facility])   HYPERCHOLESTEROLEMIA   Hypertensive urgency   Non-ST elevation (NSTEMI) myocardial infarction [name])   Hematuria    Discharge Instructions  Discharge Instructions    Ambulatory referral to Urology   Complete by: As directed    Hematuria   Call MD for:   Complete by: As directed    Chest pain, excessive blood in the urine.   Diet - low sodium heart healthy   Complete by: As directed    Diet Carb Modified   Complete by: As directed    Increase activity slowly   Complete by: As directed      Allergies as of 08/08/2019   No Known Allergies     Medication List    STOP  taking these medications   amoxicillin 500 MG capsule Commonly known as: AMOXIL   aspirin 81 MG EC tablet Replaced by: aspirin 81 MG chewable tablet   chlorhexidine 0.12 % solution Commonly known as: PERIDEX      TAKE these medications   accu-chek softclix lancets Use as instructed daily.   amLODipine 10 MG tablet Commonly known as: NORVASC Take 1 tablet (10 mg total) by mouth daily.   aspirin 81 MG chewable tablet Chew 1 tablet (81 mg total) by mouth daily. Start taking on: August 09, 2019 Replaces: aspirin 81 MG EC tablet   atorvastatin 80 MG tablet Commonly known as: LIPITOR Take 1 tablet (80 mg total) by mouth every evening.   carvedilol 25 MG tablet Commonly known as: COREG Take 1 tablet (25 mg total) by mouth 2 (two) times daily with a meal.   clopidogrel 75 MG tablet Commonly known as: PLAVIX Take 1 tablet (75 mg total) by mouth daily.   hydrALAZINE 25 MG tablet Commonly known as: APRESOLINE Take 1 tablet (25 mg total) by mouth 2 (two) times daily.   isosorbide mononitrate 120 MG 24 hr tablet Commonly known as: IMDUR Take 1 tablet (120 mg total) by mouth daily.   metFORMIN 500 MG tablet Commonly known as: GLUCOPHAGE Take 1 tablet (500 mg total) by mouth 2 (two) times daily with a meal.   nitroGLYCERIN 0.4 MG SL tablet Commonly known as: NITROSTAT Place 1 tablet (0.4 mg total) under the tongue every 5 (five) minutes as needed for chest pain (up to 3 doses. If taking 3rd dose call 911).   tamsulosin 0.4 MG Caps capsule Commonly known as: FLOMAX Take 1 capsule (0.4 mg total) by mouth daily. Start taking on: August 09, 2019   True Metrix Blood Glucose Test test strip Generic drug: glucose blood 1 each by Other route 3 (three) times daily.   True Metrix Meter w/Device Kit USE AS INSTRUCTED   TRUEplus Lancets 28G Misc USE AS INSTRUCTED       Follow-up Information    [place] Follow up.   Contact information: [address] [place] [address] [phone]             No Known Allergies  Consultations:  Cardiology   Procedures/Studies: US RENAL  Result Date: 08/06/2019 CLINICAL DATA:   Hematuria EXAM: RENAL / URINARY TRACT ULTRASOUND COMPLETE COMPARISON:  None. FINDINGS: Right Kidney: Renal measurements: 12.3 x 7.5 x 8.2 cm = volume: 397 mL. There is severe right hydronephrosis there is superimposed mild to moderate renal cortical atrophy and diffusely increased renal cortical echogenicity suggesting changes of underlying medical renal disease. No intrarenal masses or calcifications are clearly identified. Left Kidney: Renal measurements: 10.6 x 5.1 x 7.5 cm = volume: 211 mL. There is no hydronephrosis identified. There is marked asymmetric cortical atrophy involving the upper pole of the left kidney. Cortical thickness of the lower pole appears preserved and renal cortical echogenicity is normal. No intrarenal masses or calcifications are seen. Bladder: The bladder is partially decompressed. A small amount of layering debris is seen within the bladder lumen. There is mild circumferential bladder wall thickening, finding that may reflect changes of chronic bladder outlet obstruction. Other: None. IMPRESSION: 1. Severe right hydronephrosis. Superimposed asymmetric right renal cortical atrophy and cortical echogenicity suggests that this may be longstanding in nature. 2. Asymmetric cortical atrophy involving the upper pole of the left kidney 3. Circumferential mild bladder wall thickening, possibly related to changes  of chronic bladder outlet obstruction. Electronically Signed   By: Fidela Salisbury MD   On: 08/06/2019 17:21   DG Chest Port 1 View  Result Date: 08/06/2019 CLINICAL DATA:  Central chest pain and shortness of breath EXAM: PORTABLE CHEST 1 VIEW COMPARISON:  08/07/2018 FINDINGS: Cardiac shadow is enlarged. Postsurgical changes are again seen. The lungs are clear bilaterally. No bony abnormality is seen. IMPRESSION: No acute abnormality noted. Electronically Signed   By: Inez Catalina M.D.   On: 08/06/2019 02:10   ECHOCARDIOGRAM COMPLETE  Result Date: 08/06/2019    ECHOCARDIOGRAM  REPORT   Patient Name:   DREW LIPS Date of Exam: 08/06/2019 Medical Rec #:  [id number]         Height:       73.0 in Accession #:    [id number]        Weight:       227.1 lb Date of Birth:  04-26-1955         BSA:          2.271 m Patient Age:    47 years          BP:           167/107 mmHg Patient Gender: M                 HR:           92 bpm. Exam Location:  Inpatient Procedure: 2D Echo and Intracardiac Opacification Agent Indications:    Chest Pain 786.50 / R07.9  History:        Patient has prior history of Echocardiogram examinations, most                 recent 02/06/2018. CHF, CAD, Prior CABG; Risk                 Factors:Hypertension, Dyslipidemia and Diabetes. Chronic kidney                 disease.  Sonographer:    Darlina Sicilian RDCS Referring Phys: [id number] RONDELL A SMITH  Sonographer Comments: Technically difficult study due to poor echo windows. IMPRESSIONS  1. Left ventricular ejection fraction, by estimation, is 65 to 70%. The left ventricle has normal function. The left ventricle has no regional wall motion abnormalities. There is severe left ventricular hypertrophy. Indeterminate diastolic filling due to E-A fusion.  2. Right ventricular systolic function is normal. The right ventricular size is normal.  3. The mitral valve is abnormal. Trivial mitral valve regurgitation.  4. The aortic valve is abnormal. Aortic valve regurgitation is not visualized. Mild aortic valve sclerosis is present, with no evidence of aortic valve stenosis. Comparison(s): No significant change from prior study. The left ventricular function is unchanged. FINDINGS  Left Ventricle: Left ventricular ejection fraction, by estimation, is 65 to 70%. The left ventricle has normal function. The left ventricle has no regional wall motion abnormalities. Definity contrast agent was given IV to delineate the left ventricular  endocardial borders. The left ventricular internal cavity size was normal in size. There is severe left  ventricular hypertrophy. Indeterminate diastolic filling due to E-A fusion. Right Ventricle: The right ventricular size is normal. No increase in right ventricular wall thickness. Right ventricular systolic function is normal. Left Atrium: Left atrial size was normal in size. Right Atrium: Right atrial size was normal in size. Pericardium: There is no evidence of pericardial effusion. Mitral Valve: The mitral valve is abnormal. Mild mitral annular calcification. Trivial mitral  valve regurgitation. Tricuspid Valve: The tricuspid valve is grossly normal. Tricuspid valve regurgitation is trivial. Aortic Valve: The aortic valve is abnormal. Aortic valve regurgitation is not visualized. Mild aortic valve sclerosis is present, with no evidence of aortic valve stenosis. Pulmonic Valve: The pulmonic valve was normal in structure. Pulmonic valve regurgitation is not visualized. Aorta: The aortic root is normal in size and structure. IAS/Shunts: No atrial level shunt detected by color flow Doppler.  LEFT VENTRICLE PLAX 2D LVIDd:         4.00 cm  Diastology LVIDs:         3.10 cm  LV e' lateral:   7.18 cm/s LV PW:         1.80 cm  LV E/e' lateral: 4.2 LV IVS:        1.90 cm  LV e' medial:    3.92 cm/s LVOT diam:     2.30 cm  LV E/e' medial:  7.8 LV SV:         44 LV SV Index:   19 LVOT Area:     4.15 cm  LEFT ATRIUM             Index LA diam:        4.10 cm 1.81 cm/m LA Vol (A2C):   49.0 ml 21.58 ml/m LA Vol (A4C):   51.9 ml 22.85 ml/m LA Biplane Vol: 50.8 ml 22.37 ml/m  AORTIC VALVE LVOT Vmax:   79.00 cm/s LVOT Vmean:  54.100 cm/s LVOT VTI:    0.106 m  AORTA Ao Root diam: 4.60 cm MITRAL VALVE MV Area (PHT): 8.25 cm    SHUNTS MV Decel Time: 92 msec     Systemic VTI:  0.11 m MV E velocity: 30.40 cm/s  Systemic Diam: 2.30 cm MV A velocity: 60.00 cm/s MV E/A ratio:  0.51 Dorris Carnes MD Electronically signed by Dorris Carnes MD Signature Date/Time: 08/06/2019/4:23:22 PM    Final    (Echo, Carotid, EGD, Colonoscopy, ERCP)     Subjective: Patient seen and examined.  No overnight events.  Denies any chest pain.  He walked around and felt no chest pain issues.  His last urine was clearing up with no more blood.   Discharge Exam: Vitals:   08/08/19 0600 08/08/19 0747  BP: (!) 165/95 (!) 149/81  Pulse:  72  Resp: 15   Temp:  98.4 F (36.9 C)  SpO2: 100% 100%   Vitals:   08/08/19 0400 08/08/19 0500 08/08/19 0600 08/08/19 0747  BP: (!) 166/95 (!) 161/90 (!) 165/95 (!) 149/81  Pulse: 93 76  72  Resp: '20 15 15   '$ Temp:  98 F (36.7 C)  98.4 F (36.9 C)  TempSrc:  Oral  Oral  SpO2:  100% 100% 100%  Weight:      Height:        General: Pt is alert, awake, not in acute distress Cardiovascular: RRR, S1/S2 +, no rubs, no gallops Respiratory: CTA bilaterally, no wheezing, no rhonchi Abdominal: Soft, NT, ND, bowel sounds + Extremities: no edema, no cyanosis Urinal with light pink urine.    The results of significant diagnostics from this hospitalization (including imaging, microbiology, ancillary and laboratory) are listed below for reference.     Microbiology: Recent Results (from the past 240 hour(s))  Urine Culture     Status: Abnormal   Collection Time: 08/06/19  2:26 AM   Specimen: Urine, Random  Result Value Ref Range Status   Specimen Description URINE, RANDOM  Final   Special Requests   Final    NONE Performed at [facility], [place] [address], [place], [place] [address]    Culture MULTIPLE SPECIES PRESENT, SUGGEST RECOLLECTION (A)  Final   Report Status 08/07/2019 FINAL  Final  SARS Coronavirus 2 by RT PCR (hospital order, performed in [facility] hospital lab) Nasopharyngeal Nasopharyngeal Swab     Status: None   Collection Time: 08/06/19  4:34 AM   Specimen: Nasopharyngeal Swab  Result Value Ref Range Status   SARS Coronavirus 2 NEGATIVE NEGATIVE Final    Comment: (NOTE) SARS-CoV-2 target nucleic acids are NOT DETECTED.  The SARS-CoV-2 RNA is generally detectable in upper  and lower respiratory specimens during the acute phase of infection. The lowest concentration of SARS-CoV-2 viral copies this assay can detect is 250 copies / mL. A negative result does not preclude SARS-CoV-2 infection and should not be used as the sole basis for treatment or other patient management decisions.  A negative result may occur with improper specimen collection / handling, submission of specimen other than nasopharyngeal swab, presence of viral mutation(s) within the areas targeted by this assay, and inadequate number of viral copies (<250 copies / mL). A negative result must be combined with clinical observations, patient history, and epidemiological information.  Fact Sheet for Patients:   StrictlyIdeas.no  Fact Sheet for Healthcare Providers: BankingDealers.co.za  This test is not yet approved or  cleared by the [place] FDA and has been authorized for detection and/or diagnosis of SARS-CoV-2 by FDA under an Emergency Use Authorization (EUA).  This EUA will remain in effect (meaning this test can be used) for the duration of the COVID-19 declaration under Section 564(b)(1) of the Act, 21 U.S.C. section 360bbb-3(b)(1), unless the authorization is terminated or revoked sooner.  Performed at [facility], [place] [address] [place], [place] [address]   MRSA PCR Screening     Status: None   Collection Time: 08/06/19  9:45 AM   Specimen: Nasal Mucosa; Nasopharyngeal  Result Value Ref Range Status   MRSA by PCR NEGATIVE NEGATIVE Final    Comment:        The GeneXpert MRSA Assay (FDA approved for NASAL specimens only), is one component of a comprehensive MRSA colonization surveillance program. It is not intended to diagnose MRSA infection nor to guide or monitor treatment for MRSA infections. Performed at [facility], [place] [address] [place], [place] [address]      Labs: BNP (last 3 results) Recent  Labs    08/06/19 0437  BNP 403.4*   Basic Metabolic Panel: Recent Labs  Lab 08/06/19 0000 08/08/19 0149  NA 139 135  K 4.2 4.5  CL 108 103  CO2 21* 23  GLUCOSE 129* 134*  BUN 16 34*  CREATININE 1.50* 1.86*  CALCIUM 9.0 8.9  MG  --  1.9  PHOS  --  3.8   Liver Function Tests: No results for input(s): AST, ALT, ALKPHOS, BILITOT, PROT, ALBUMIN in the last 168 hours. No results for input(s): LIPASE, AMYLASE in the last 168 hours. No results for input(s): AMMONIA in the last 168 hours. CBC: Recent Labs  Lab 08/06/19 0000 08/06/19 2138 08/07/19 0211 08/08/19 0149  WBC 7.5 7.6 8.4 6.4  HGB 14.5 12.5* 12.1* 11.7*  HCT 43.7 38.4* 36.5* 35.6*  MCV 86.7 87.1 87.3 87.5  PLT 225 205 198 180   Cardiac Enzymes: No results for input(s): CKTOTAL, CKMB, CKMBINDEX, TROPONINI in the last 168  hours. BNP: Invalid input(s): POCBNP CBG: Recent Labs  Lab 08/07/19 0615 08/07/19 1141 08/07/19 1653 08/07/19 2205 08/08/19 0618  GLUCAP 112* 129* 157* 128* 128*   D-Dimer No results for input(s): DDIMER in the last 72 hours. Hgb A1c Recent Labs    08/06/19 1010  HGBA1C 5.9*   Lipid Profile Recent Labs    08/06/19 0952  CHOL 167  HDL 31*  LDLCALC 120*  TRIG 81  CHOLHDL 5.4   Thyroid function studies No results for input(s): TSH, T4TOTAL, T3FREE, THYROIDAB in the last 72 hours.  Invalid input(s): FREET3 Anemia work up No results for input(s): VITAMINB12, FOLATE, FERRITIN, TIBC, IRON, RETICCTPCT in the last 72 hours. Urinalysis    Component Value Date/Time   COLORURINE BROWN (A) 08/05/2019 2340   APPEARANCEUR TURBID (A) 08/05/2019 2340   LABSPEC  08/05/2019 2340    TEST NOT REPORTED DUE TO COLOR INTERFERENCE OF URINE PIGMENT   PHURINE  08/05/2019 2340    TEST NOT REPORTED DUE TO COLOR INTERFERENCE OF URINE PIGMENT   GLUCOSEU (A) 08/05/2019 2340    TEST NOT REPORTED DUE TO COLOR INTERFERENCE OF URINE PIGMENT   HGBUR (A) 08/05/2019 2340    TEST NOT REPORTED DUE TO  COLOR INTERFERENCE OF URINE PIGMENT   HGBUR large 10/22/2007 0921   BILIRUBINUR (A) 08/05/2019 2340    TEST NOT REPORTED DUE TO COLOR INTERFERENCE OF URINE PIGMENT   KETONESUR (A) 08/05/2019 2340    TEST NOT REPORTED DUE TO COLOR INTERFERENCE OF URINE PIGMENT   PROTEINUR (A) 08/05/2019 2340    TEST NOT REPORTED DUE TO COLOR INTERFERENCE OF URINE PIGMENT   UROBILINOGEN 1.0 10/25/2012 0240   NITRITE (A) 08/05/2019 2340    TEST NOT REPORTED DUE TO COLOR INTERFERENCE OF URINE PIGMENT   LEUKOCYTESUR (A) 08/05/2019 2340    TEST NOT REPORTED DUE TO COLOR INTERFERENCE OF URINE PIGMENT   Sepsis Labs Invalid input(s): PROCALCITONIN,  WBC,  LACTICIDVEN Microbiology Recent Results (from the past 240 hour(s))  Urine Culture     Status: Abnormal   Collection Time: 08/06/19  2:26 AM   Specimen: Urine, Random  Result Value Ref Range Status   Specimen Description URINE, RANDOM  Final   Special Requests   Final    NONE Performed at [facility], [place] [address] [place], [place] [address]    Culture MULTIPLE SPECIES PRESENT, SUGGEST RECOLLECTION (A)  Final   Report Status 08/07/2019 FINAL  Final  SARS Coronavirus 2 by RT PCR (hospital order, performed in [facility] hospital lab) Nasopharyngeal Nasopharyngeal Swab     Status: None   Collection Time: 08/06/19  4:34 AM   Specimen: Nasopharyngeal Swab  Result Value Ref Range Status   SARS Coronavirus 2 NEGATIVE NEGATIVE Final    Comment: (NOTE) SARS-CoV-2 target nucleic acids are NOT DETECTED.  The SARS-CoV-2 RNA is generally detectable in upper and lower respiratory specimens during the acute phase of infection. The lowest concentration of SARS-CoV-2 viral copies this assay can detect is 250 copies / mL. A negative result does not preclude SARS-CoV-2 infection and should not be used as the sole basis for treatment or other patient management decisions.  A negative result may occur with improper specimen collection / handling, submission  of specimen other than nasopharyngeal swab, presence of viral mutation(s) within the areas targeted by this assay, and inadequate number of viral copies (<250 copies / mL). A negative result must be combined with clinical observations, patient history, and epidemiological information.  Fact Sheet for Patients:   StrictlyIdeas.no  Fact Sheet for Healthcare Providers: BankingDealers.co.za  This test is not yet approved or  cleared by the [place] FDA and has been authorized for detection and/or diagnosis of SARS-CoV-2 by FDA under an Emergency Use Authorization (EUA).  This EUA will remain in effect (meaning this test can be used) for the duration of the COVID-19 declaration under Section 564(b)(1) of the Act, 21 U.S.C. section 360bbb-3(b)(1), unless the authorization is terminated or revoked sooner.  Performed at [facility], [place] [address] [place], [place] [address]   MRSA PCR Screening     Status: None   Collection Time: 08/06/19  9:45 AM   Specimen: Nasal Mucosa; Nasopharyngeal  Result Value Ref Range Status   MRSA by PCR NEGATIVE NEGATIVE Final    Comment:        The GeneXpert MRSA Assay (FDA approved for NASAL specimens only), is one component of a comprehensive MRSA colonization surveillance program. It is not intended to diagnose MRSA infection nor to guide or monitor treatment for MRSA infections. Performed at [facility], [place] [address] [place], [place] [address]      Time coordinating discharge:  35 minutes  SIGNED:   Barb Merino, MD  [facility] 08/08/2019, 11:16 AM

## 2019-08-08 NOTE — Progress Notes (Signed)
Progress Note  Patient Name: Darrell [name] Date of Encounter: 08/08/2019  [facility] Cardiologist: [name], MD   Subjective   He is feeling better this am, denies chest pain.  Inpatient Medications    Scheduled Meds: . amLODipine  10 mg Oral Daily  . aspirin  81 mg Oral Daily  . atorvastatin  80 mg Oral QPM  . carvedilol  25 mg Oral BID WC  . clopidogrel  75 mg Oral Daily  . insulin aspart  0-9 Units Subcutaneous TID WC  . isosorbide mononitrate  120 mg Oral Daily  . sodium chloride flush  3 mL Intravenous Once  . tamsulosin  0.4 mg Oral Daily   Continuous Infusions:  PRN Meds: acetaminophen, alum & mag hydroxide-simeth, labetalol, ondansetron (ZOFRAN) IV   Vital Signs    Vitals:   08/08/19 0400 08/08/19 0500 08/08/19 0600 08/08/19 0747  BP: (!) 166/95 (!) 161/90 (!) 165/95 (!) 149/81  Pulse: 93 76  72  Resp: 20 15 15    Temp:  98 F (36.7 C)  98.4 F (36.9 C)  TempSrc:  Oral  Oral  SpO2:  100% 100% 100%  Weight:      Height:        Intake/Output Summary (Last 24 hours) at 08/08/2019 0842 Last data filed at 08/08/2019 0747 Gross per 24 hour  Intake 1680 ml  Output 2360 ml  Net -680 ml   Last 3 Weights 08/06/2019 08/05/2019 08/01/2018  Weight (lbs) 227 lb 1.2 oz 253 lb 8.5 oz 220 lb 14.4 oz  Weight (kg) 103 kg 115 kg 100.2 kg      Telemetry    SR in low 100' - Personally Reviewed  ECG    No new tracing - Personally Reviewed  Physical Exam  Poor dentition, disheveled GEN: No acute distress.   Neck: No JVD Cardiac: RRR, no murmurs, rubs, or gallops.  Respiratory: Clear to auscultation bilaterally. GI: Soft, nontender, non-distended  MS: No edema; No deformity. Neuro:  Nonfocal  Psych: Normal affect   Labs    High Sensitivity Troponin:   Recent Labs  Lab 08/06/19 0000 08/06/19 0437 08/06/19 0952  TROPONINIHS 24* 193* 289*     Chemistry Recent Labs  Lab 08/06/19 0000 08/08/19 0149  NA 139 135  K 4.2 4.5  CL 108 103  CO2  21* 23  GLUCOSE 129* 134*  BUN 16 34*  CREATININE 1.50* 1.86*  CALCIUM 9.0 8.9  GFRNONAA 49* 38*  GFRAA 57* 44*  ANIONGAP 10 9    Hematology Recent Labs  Lab 08/06/19 2138 08/07/19 0211 08/08/19 0149  WBC 7.6 8.4 6.4  RBC 4.41 4.18* 4.07*  HGB 12.5* 12.1* 11.7*  HCT 38.4* 36.5* 35.6*  MCV 87.1 87.3 87.5  MCH 28.3 28.9 28.7  MCHC 32.6 33.2 32.9  RDW 14.4 14.5 14.6  PLT 205 198 180   BNP Recent Labs  Lab 08/06/19 0437  BNP 223.5*    DDimer No results for input(s): DDIMER in the last 168 hours.   Radiology    10/07/19 RENAL  Result Date: 08/06/2019 CLINICAL DATA:  Hematuria EXAM: RENAL / URINARY TRACT ULTRASOUND COMPLETE COMPARISON:  None. FINDINGS: Right Kidney: Renal measurements: 12.3 x 7.5 x 8.2 cm = volume: 397 mL. There is severe right hydronephrosis there is superimposed mild to moderate renal cortical atrophy and diffusely increased renal cortical echogenicity suggesting changes of underlying medical renal disease. No intrarenal masses or calcifications are clearly identified. Left Kidney: Renal measurements: 10.6 x  5.1 x 7.5 cm = volume: 211 mL. There is no hydronephrosis identified. There is marked asymmetric cortical atrophy involving the upper pole of the left kidney. Cortical thickness of the lower pole appears preserved and renal cortical echogenicity is normal. No intrarenal masses or calcifications are seen. Bladder: The bladder is partially decompressed. A small amount of layering debris is seen within the bladder lumen. There is mild circumferential bladder wall thickening, finding that may reflect changes of chronic bladder outlet obstruction. Other: None. IMPRESSION: 1. Severe right hydronephrosis. Superimposed asymmetric right renal cortical atrophy and cortical echogenicity suggests that this may be longstanding in nature. 2. Asymmetric cortical atrophy involving the upper pole of the left kidney 3. Circumferential mild bladder wall thickening, possibly related to  changes of chronic bladder outlet obstruction. Electronically Signed   By: Helyn Numbers MD   On: 08/06/2019 17:21   ECHOCARDIOGRAM COMPLETE  Result Date: 08/06/2019    ECHOCARDIOGRAM REPORT   Patient Name:   Darrell [name] Date of Exam: 08/06/2019 Medical Rec #:  [id number]         Height:       73.0 in Accession #:    [id number]        Weight:       227.1 lb Date of Birth:  [birth year]         BSA:          2.271 m Patient Age:    64 years          BP:           167/107 mmHg Patient Gender: M                 HR:           92 bpm. Exam Location:  Inpatient Procedure: 2D Echo and Intracardiac Opacification Agent Indications:    Chest Pain 786.50 / R07.9  History:        Patient has prior history of Echocardiogram examinations, most                 recent 02/06/2018. CHF, CAD, Prior CABG; Risk                 Factors:Hypertension, Dyslipidemia and Diabetes. Chronic kidney                 disease.  Sonographer:    Leta Jungling RDCS Referring Phys: [id number] RONDELL A SMITH  Sonographer Comments: Technically difficult study due to poor echo windows. IMPRESSIONS  1. Left ventricular ejection fraction, by estimation, is 65 to 70%. The left ventricle has normal function. The left ventricle has no regional wall motion abnormalities. There is severe left ventricular hypertrophy. Indeterminate diastolic filling due to E-A fusion.  2. Right ventricular systolic function is normal. The right ventricular size is normal.  3. The mitral valve is abnormal. Trivial mitral valve regurgitation.  4. The aortic valve is abnormal. Aortic valve regurgitation is not visualized. Mild aortic valve sclerosis is present, with no evidence of aortic valve stenosis. Comparison(s): No significant change from prior study. The left ventricular function is unchanged. FINDINGS  Left Ventricle: Left ventricular ejection fraction, by estimation, is 65 to 70%. The left ventricle has normal function. The left ventricle has no regional wall motion  abnormalities. Definity contrast agent was given IV to delineate the left ventricular  endocardial borders. The left ventricular internal cavity size was normal in size. There is severe left ventricular hypertrophy. Indeterminate diastolic filling due to E-A  fusion. Right Ventricle: The right ventricular size is normal. No increase in right ventricular wall thickness. Right ventricular systolic function is normal. Left Atrium: Left atrial size was normal in size. Right Atrium: Right atrial size was normal in size. Pericardium: There is no evidence of pericardial effusion. Mitral Valve: The mitral valve is abnormal. Mild mitral annular calcification. Trivial mitral valve regurgitation. Tricuspid Valve: The tricuspid valve is grossly normal. Tricuspid valve regurgitation is trivial. Aortic Valve: The aortic valve is abnormal. Aortic valve regurgitation is not visualized. Mild aortic valve sclerosis is present, with no evidence of aortic valve stenosis. Pulmonic Valve: The pulmonic valve was normal in structure. Pulmonic valve regurgitation is not visualized. Aorta: The aortic root is normal in size and structure. IAS/Shunts: No atrial level shunt detected by color flow Doppler.  LEFT VENTRICLE PLAX 2D LVIDd:         4.00 cm  Diastology LVIDs:         3.10 cm  LV e' lateral:   7.18 cm/s LV PW:         1.80 cm  LV E/e' lateral: 4.2 LV IVS:        1.90 cm  LV e' medial:    3.92 cm/s LVOT diam:     2.30 cm  LV E/e' medial:  7.8 LV SV:         44 LV SV Index:   19 LVOT Area:     4.15 cm  LEFT ATRIUM             Index LA diam:        4.10 cm 1.81 cm/m LA Vol (A2C):   49.0 ml 21.58 ml/m LA Vol (A4C):   51.9 ml 22.85 ml/m LA Biplane Vol: 50.8 ml 22.37 ml/m  AORTIC VALVE LVOT Vmax:   79.00 cm/s LVOT Vmean:  54.100 cm/s LVOT VTI:    0.106 m  AORTA Ao Root diam: 4.60 cm MITRAL VALVE MV Area (PHT): 8.25 cm    SHUNTS MV Decel Time: 92 msec     Systemic VTI:  0.11 m MV E velocity: 30.40 cm/s  Systemic Diam: 2.30 cm MV A  velocity: 60.00 cm/s MV E/A ratio:  0.51 Dietrich PatesPaula Ross MD Electronically signed by Dietrich PatesPaula Ross MD Signature Date/Time: 08/06/2019/4:23:22 PM    Final    Cardiac Studies   TTE 08/06/19  1. Left ventricular ejection fraction, by estimation, is 65 to 70%. The  left ventricle has normal function. The left ventricle has no regional  wall motion abnormalities. There is severe left ventricular hypertrophy.  Indeterminate diastolic filling due  to E-A fusion.  2. Right ventricular systolic function is normal. The right ventricular  size is normal.  3. The mitral valve is abnormal. Trivial mitral valve regurgitation.  4. The aortic valve is abnormal. Aortic valve regurgitation is not  visualized. Mild aortic valve sclerosis is present, with no evidence of  aortic valve stenosis.   Comparison(s): No significant change from prior study. The left  ventricular function is unchanged.    Patient Profile     64 y.o. male with a hx of CAD, prior CABG and multiple interventions, HTN, HLD, DM and noncompliance due to financial issues, stents to pLCX 2017 and restenosis in 2020 had cutting balloon, atherectomy who is being seen today for the evaluation of chest pain with elevated troponin at the request of Dr. Katrinka BlazingSmith, in ER severely hypertensive 248/140 mmHg.  Mr. Pollie MeyerMcIntyre with HTN, HLD, DM, and now bedbugs, in addition to  CAD with hx of CABG and multiple interventions and last cath 04/2018 with new prox native LCX with severe in stent restenosis at 99% and underwent cutting balloon atherectomy.  Also noted the mid to distal AV groove LCX stent was occluded whereas in 2017 it was subtotally occluded.  His distal LAD beyond LIMA insertion was diffusely and severely diseased as well.  Assessment & Plan    1. Chest pain with elevated troponin to 193-->289 with known CAD.  Pt was also hypertensive on arrival to ER and now on IV NTG drip.  He was also given amlodipine.  Sec to hypertensive urgency, now asymptomatic  with improved BP, he had been walking with exercise and no pain. Echo with hyperdynamic LVEF 65-70% and no new WMA.   2. CAD wit hx of CABG and cutting balloon angioplasty 04/2018. VG to RCA 100% stenosed and hx of DES to LCX in 2017.  Distal LCX occluded  3. HTN -still elevated, I will start hydralazine 25 mg PO BID 4. Hematuria - IV heparin was discontinued yesterday, urine with less blood now, Hb stable at 11.7 5. HLD off statin, check lipids and resume.  Money is an issue.  6. Bed bugs per IM   For questions or updates, please contact [facility] Please consult [email or web address] for contact info under     Signed, Tobias Alexander, MD  08/08/2019, 8:42 AM

## 2019-08-08 NOTE — TOC Progression Note (Signed)
Transition of Care [facility]) - Progression Note    Patient Details  Name: Darrell [name] MRN: [id number] Date of Birth: November 20, 1955  Transition of Care [facility] CM/SW Contact  Levada Schilling Phone Number: 08/08/2019, 12:06 PM  Clinical Narrative:    CSW was consulted by pt's RN Westley Gambles for transportation assistance.  CSW left taxi voucher with RN.  CSW gave pt's resources for food and shelters.  Pt had not questions for CSW.        Expected Discharge Plan and Services           Expected Discharge Date: 08/08/19                                     Social Determinants of Health (SDOH) Interventions    Readmission Risk Interventions No flowsheet data found.

## 2019-08-08 NOTE — Care Management (Signed)
Patient active with [facility] for PCP and medication assistance. Patient has coverage through Medicare. [facility] added to AVS for follow up.

## 2019-08-08 NOTE — Progress Notes (Signed)
Talked Dr. Jerral Ralph regarding patient medications for tonight. Dr. Jerral Ralph is okay to skip one dose of hydralazine tonight and give evening dose of medications then send him home. Patient is homeless and wife is staying at [employer]. SW brought the taxi voucher for him. Removed PIV access x 1 and gave discharge instruction. Will call taxi company for picking him up. Patient doesn't have clean clothe, gave paper clothes. HS [employer]

## 2019-08-09 ENCOUNTER — Telehealth: Payer: Self-pay

## 2019-08-09 MED FILL — ISOSORBIDE MN 120 MG TAB SA: 120 | 30 days supply | Qty: 30 | Fill #0

## 2019-08-09 MED FILL — ATORVASTATIN 80 MG TABLET: 80 | 30 days supply | Qty: 30 | Fill #0

## 2019-08-09 MED FILL — AMLODIPINE BESYLATE 10 MG T: 10 | 30 days supply | Qty: 30 | Fill #0

## 2019-08-09 MED FILL — CLOPIDOGREL 75 MG TABLET: 75 | 30 days supply | Qty: 30 | Fill #0

## 2019-08-09 MED FILL — hydrALAZINE HCL 25 MG TABS: 25 | 30 days supply | Qty: 60 | Fill #0

## 2019-08-09 MED FILL — METFORMIN HCL 500 MG TABS: 500 | 30 days supply | Qty: 60 | Fill #0

## 2019-08-09 MED FILL — NITROGLYCERIN 0.4 MG TAB SL: 0.4 | 30 days supply | Qty: 25 | Fill #0

## 2019-08-09 MED FILL — TAMSULOSIN HCL 0.4 MG CAP: 0.4 | 30 days supply | Qty: 30 | Fill #0

## 2019-08-09 MED FILL — CARVEDILOL 25 MG TABLET: 25 | 30 days supply | Qty: 60 | Fill #0

## 2019-08-09 NOTE — Telephone Encounter (Signed)
Transition Care Management Follow-up Telephone Call  Date of discharge and from where: 08/08/2019, [facility].    How have you been since you were released from the hospital? He stated that he is feeling " pretty good."  Any questions or concerns? when asked about questions/concerns, he said that he did not have any. However, he does not have any medication.  Has medicare A/B but no drug coverage.   His income- $695/month; his wife's income- $390/month. Has not applied for medicaid. He said that he has no money at this time.    Items Reviewed:  Did the pt receive and understand the discharge instructions provided?  he said that he has them. No questions at this time   Medications obtained and verified?  he said that he has no medications. This CM spoke to [place], [facility] who said that there are 9 medications ready for pick up.  One time free fill applied.  This information was shared with the patient and he said he would need to contact his son to have him pick them up.    Any new allergies since your discharge?  none reported   Do you have support at home?  living with his wife at [facility].  He is interested in permanent housing. Has information for [facility] but has not been there yet.  Informed him that this CM could provide him with the contact information for [facility] but he said he needed to contact his son first about picking up the medications.   Has glucometer and testing supplies. Blood sugar this morning 126.  Functional Questionnaire: (I = Independent and D = Dependent) ADLs:independent, has walker to use with ambulation.  Follow up appointments reviewed:   PCP Hospital f/u appt confirmed?Dr Alvis Lemmings 08/25/2019 @ 1050  Specialist Hospital f/u appt confirmed? none scheduled at this time  Are transportation arrangements needed?  he has no transportation  [employer] application had been submitted. He said that his wife needs to call to  check on the application   If their condition worsens, is the pt aware to call PCP or go to the Emergency Dept.?  yes  Was the patient provided with contact information for the PCP's office or ED? } he has the contact phone number  Was to pt encouraged to call back with questions or concerns?  yes

## 2019-08-09 NOTE — Telephone Encounter (Signed)
From the discharge call.  Appt with Dr Alvis Lemmings 08/25/2019   when asked about questions/concerns, he said that he did not have any. However, he does not have any medication.    Has medicare A/B but no drug coverage.     His income- $695/month; his wife's income- $390/month. Has not applied for medicaid. He said that he has no money at this time.    he said that he has no medications. This CM spoke to [place], [facility] who said that there are 9 medications ready for pick up.  One time free fill applied.  This information was shared with the patient and he said he would need to contact his son to have him pick them up.     living with his wife at [facility].  He is interested in permanent housing. Has information for [facility] but has not been there yet.  Informed him that this CM could provide him with the contact information for [facility] but he said he needed to contact his son first about picking up the medications.    Has glucometer and testing supplies. Blood sugar this morning 126.

## 2019-08-25 ENCOUNTER — Ambulatory Visit: Payer: Medicare Other | Admitting: Family Medicine

## 2019-09-06 ENCOUNTER — Telehealth: Payer: Self-pay | Admitting: Family Medicine

## 2019-09-06 ENCOUNTER — Other Ambulatory Visit: Payer: Self-pay | Admitting: Family Medicine

## 2019-09-06 DIAGNOSIS — E1159 Type 2 diabetes mellitus with other circulatory complications: Secondary | ICD-10-CM

## 2019-09-06 DIAGNOSIS — I2510 Atherosclerotic heart disease of native coronary artery without angina pectoris: Secondary | ICD-10-CM

## 2019-09-06 DIAGNOSIS — I10 Essential (primary) hypertension: Secondary | ICD-10-CM

## 2019-09-06 NOTE — Telephone Encounter (Signed)
Copied from CRM [id number]. Topic: Quick Communication - Rx Refill/Question >> Sep 06, 2019  2:35 PM Jaquita Rector A wrote: Medication: amLODipine (NORVASC) 10 MG tablet, atorvastatin (LIPITOR) 80 MG tablet, carvedilol (COREG) 25 MG tablet, clopidogrel (PLAVIX) 75 MG tablet, hydrALAZINE (APRESOLINE) 25 MG tablet, isosorbide mononitrate (IMDUR) 120 MG 24 hr tablet, nitroGLYCERIN (NITROSTAT) 0.4 MG SL tablet, tamsulosin (FLOMAX) 0.4 MG CAPS capsule  Has the patient contacted their pharmacy? Yes.   (Agent: If no, request that the patient contact the pharmacy for the refill.) (Agent: If yes, when and what did the pharmacy advise?)  Preferred Pharmacy (with phone number or street name): [facility] [place], [place] [facility] [place] [address]  Phone:  [phone] Fax:  [phone]     Agent: Please be advised that RX refills may take up to 3 business days. We ask that you follow-up with your pharmacy.

## 2019-09-06 NOTE — Telephone Encounter (Signed)
Medication: metFORMIN (GLUCOPHAGE) 500 MG tablet [[id number]]   Has the patient contacted their pharmacy? Yes  (Agent: If no, request that the patient contact the pharmacy for the refill.) (Agent: If yes, when and what did the pharmacy advise?)  Preferred Pharmacy (with phone number or street name): [facility] - [place], [place] [facility] [place] [address]  Phone:  [phone] Fax:  [phone]     Agent: Please be advised that RX refills may take up to 3 business days. We ask that you follow-up with your pharmacy.

## 2019-09-06 NOTE — Telephone Encounter (Signed)
Pt needs refill for hydrALAZINE (APRESOLINE) 25 MG tablet Please advise if he can receive a curtesy refill

## 2019-09-07 MED ORDER — HYDRALAZINE HCL 25 MG PO TABS: 25.0000 mg | ORAL_TABLET | Freq: Two times a day (BID) | ORAL | 0 refills | Status: DC

## 2019-09-07 MED FILL — hydrALAZINE HCL 25 MG TABS: 25 | 30 days supply | Qty: 60 | Fill #0

## 2019-09-07 NOTE — Telephone Encounter (Signed)
Patient needs appointment.

## 2019-09-07 NOTE — Telephone Encounter (Signed)
Patient calling to check status

## 2019-09-08 MED ORDER — CARVEDILOL 25 MG PO TABS: 25.0000 mg | ORAL_TABLET | Freq: Two times a day (BID) | ORAL | 0 refills | Status: DC

## 2019-09-08 MED ORDER — ATORVASTATIN CALCIUM 80 MG PO TABS: 80.0000 mg | ORAL_TABLET | Freq: Every evening | ORAL | 0 refills | Status: DC

## 2019-09-08 MED ORDER — AMLODIPINE BESYLATE 10 MG PO TABS: 10.0000 mg | ORAL_TABLET | Freq: Every day | ORAL | 0 refills | Status: DC

## 2019-09-08 MED ORDER — NITROGLYCERIN 0.4 MG SL SUBL: 0.4000 mg | SUBLINGUAL_TABLET | SUBLINGUAL | 0 refills | Status: DC | PRN

## 2019-09-08 MED ORDER — CLOPIDOGREL BISULFATE 75 MG PO TABS: 75.0000 mg | ORAL_TABLET | Freq: Every day | ORAL | 0 refills | Status: DC

## 2019-09-08 MED ORDER — ISOSORBIDE MONONITRATE ER 120 MG PO TB24: 120.0000 mg | ORAL_TABLET | Freq: Every day | ORAL | 0 refills | Status: DC

## 2019-09-08 MED ORDER — TAMSULOSIN HCL 0.4 MG PO CAPS: 0.4000 mg | ORAL_CAPSULE | Freq: Every day | ORAL | 0 refills | Status: DC

## 2019-09-09 MED FILL — AMLODIPINE BESYLATE 10 MG T: 10 | 30 days supply | Qty: 30 | Fill #0

## 2019-09-09 MED FILL — NITROGLYCERIN 0.4 MG TAB SL: 0.4 | 25 days supply | Qty: 25 | Fill #0

## 2019-09-09 MED FILL — ATORVASTATIN 80 MG TABLET: 80 | 30 days supply | Qty: 30 | Fill #0

## 2019-09-09 MED FILL — CLOPIDOGREL 75 MG TABLET: 75 | 30 days supply | Qty: 30 | Fill #0

## 2019-09-09 MED FILL — ISOSORBIDE MN 120 MG TAB SA: 120 | 30 days supply | Qty: 30 | Fill #0

## 2019-09-09 MED FILL — CARVEDILOL 25 MG TABLET: 25 | 30 days supply | Qty: 60 | Fill #0

## 2019-09-09 MED FILL — TAMSULOSIN HCL 0.4 MG CAP: 0.4 | 30 days supply | Qty: 30 | Fill #0

## 2019-09-10 ENCOUNTER — Ambulatory Visit: Payer: Medicare Other | Attending: Internal Medicine | Admitting: Internal Medicine

## 2019-09-10 ENCOUNTER — Other Ambulatory Visit: Payer: Self-pay

## 2019-09-10 ENCOUNTER — Other Ambulatory Visit: Payer: Self-pay | Admitting: Internal Medicine

## 2019-09-10 DIAGNOSIS — I10 Essential (primary) hypertension: Secondary | ICD-10-CM

## 2019-09-10 DIAGNOSIS — R310 Gross hematuria: Secondary | ICD-10-CM

## 2019-09-10 DIAGNOSIS — I25710 Atherosclerosis of autologous vein coronary artery bypass graft(s) with unstable angina pectoris: Secondary | ICD-10-CM

## 2019-09-10 DIAGNOSIS — E1159 Type 2 diabetes mellitus with other circulatory complications: Secondary | ICD-10-CM

## 2019-09-10 DIAGNOSIS — Z09 Encounter for follow-up examination after completed treatment for conditions other than malignant neoplasm: Secondary | ICD-10-CM

## 2019-09-10 MED ORDER — CARVEDILOL 25 MG PO TABS: 25.0000 mg | ORAL_TABLET | Freq: Two times a day (BID) | ORAL | 1 refills | Status: DC

## 2019-09-10 MED ORDER — ISOSORBIDE MONONITRATE ER 120 MG PO TB24: 120.0000 mg | ORAL_TABLET | Freq: Every day | ORAL | 1 refills | Status: DC

## 2019-09-10 MED ORDER — METFORMIN HCL 500 MG PO TABS: 500.0000 mg | ORAL_TABLET | Freq: Two times a day (BID) | ORAL | 1 refills | Status: DC

## 2019-09-10 MED ORDER — METFORMIN HCL 500 MG PO TABS: 500.0000 mg | ORAL_TABLET | Freq: Every day | ORAL | 1 refills | Status: DC

## 2019-09-10 MED ORDER — NITROGLYCERIN 0.4 MG SL SUBL: 0.4000 mg | SUBLINGUAL_TABLET | SUBLINGUAL | 2 refills | Status: DC | PRN

## 2019-09-10 MED ORDER — CLOPIDOGREL BISULFATE 75 MG PO TABS: 75.0000 mg | ORAL_TABLET | Freq: Every day | ORAL | 1 refills | Status: DC

## 2019-09-10 MED ORDER — AMLODIPINE BESYLATE 10 MG PO TABS: 10.0000 mg | ORAL_TABLET | Freq: Every day | ORAL | 1 refills | Status: DC

## 2019-09-10 MED ORDER — ATORVASTATIN CALCIUM 80 MG PO TABS: 80.0000 mg | ORAL_TABLET | Freq: Every evening | ORAL | 1 refills | Status: DC

## 2019-09-10 MED ORDER — TAMSULOSIN HCL 0.4 MG PO CAPS: 0.4000 mg | ORAL_CAPSULE | Freq: Every day | ORAL | 1 refills | Status: DC

## 2019-09-10 MED ORDER — HYDRALAZINE HCL 25 MG PO TABS: 25.0000 mg | ORAL_TABLET | Freq: Two times a day (BID) | ORAL | 1 refills | Status: DC

## 2019-09-10 MED FILL — METFORMIN HCL 500 MG TABS: 500 | 30 days supply | Qty: 30 | Fill #0

## 2019-09-10 NOTE — Progress Notes (Signed)
Virtual Visit via Telephone Note Due to current restrictions/limitations of in-office visits due to the COVID-19 pandemic, this scheduled clinical appointment was converted to a telehealth visit  I connected with Darrell [name] on 09/10/19 at 10:25 a.m by telephone and verified that I am speaking with the correct person using two identifiers. I am in my office.  The patient is at home.  Only the patient and myself participated in this encounter.  I discussed the limitations, risks, security and privacy concerns of performing an evaluation and management service by telephone and the availability of in person appointments. I also discussed with the patient that there may be a patient responsible charge related to this service. The patient expressed understanding and agreed to proceed.   History of Present Illness: Patient with history of DM type II, diastolic CHF with EF of 55 to 60%, HTN, CAD status post CABG, CKD stage III, HL, anemia.  PCP is Dr. Margarita Rana.  He missed his hospital follow-up appointment with her 08/25/2019.  Purpose of today's visit is medication refill request and transition of care.  Date of hospitalization: 7/8-11/2019 Date of phone call from case manager: 08/09/2019  Patient hospitalized with chest pains in the setting of elevated blood pressure and being out of his medications for 1 week.  He had non-ST elevation MI.  He was followed by cardiology.  He refused cardiac catheterization.  He prefers to resume medication management.  He was told to follow-up with his PCP.  Hospital course was complicated by gross hematuria.  Renal ultrasound with severe right-sided hydronephrosis reported as possibly chronic he did not have a UTI.  Advised to follow-up with the urologist.  His A1c was 5.2.  He was resumed on Metformin.  Today:  CAD/HTN: He reports having used nitroglycerin once this week for chest pain episode that occurred at rest.  It resolved with 1 sublingual nitroglycerin.   Reports compliance with his medications but need refills on all of them.  No chest pains or shortness of breath today.  He does not have a device to check blood pressure.  He is not sure of his next cardiology appointment.  Hematuria: Reports no further episodes of this.  Denies any dysuria.  DM: Checking his blood sugars before meals.  Reports that his blood sugars have been good.  The highest reading that he got one time was 192.     Current Outpatient Medications  Medication Instructions  . amLODipine (NORVASC) 10 mg, Oral, Daily, Please make PCP appointment.  Marland Kitchen aspirin 81 mg, Oral, Daily  . atorvastatin (LIPITOR) 80 mg, Oral, Every evening, Please make PCP appointment.  . Blood Glucose Monitoring Suppl (TRUE METRIX METER) w/Device KIT USE AS INSTRUCTED  . carvedilol (COREG) 25 mg, Oral, 2 times daily with meals, Please make PCP appointment.  . clopidogrel (PLAVIX) 75 mg, Oral, Daily, Please make PCP appointment.  Marland Kitchen glucose blood (TRUE METRIX BLOOD GLUCOSE TEST) test strip 1 each, Other, 3 times daily  . hydrALAZINE (APRESOLINE) 25 mg, Oral, 2 times daily  . isosorbide mononitrate (IMDUR) 120 mg, Oral, Daily, Please make PCP appointment.  Elmore Guise Devices (ACCU-CHEK SOFTCLIX) lancets Use as instructed daily.  . metFORMIN (GLUCOPHAGE) 500 mg, Oral, 2 times daily with meals  . nitroGLYCERIN (NITROSTAT) 0.4 mg, Sublingual, Every 5 min PRN  . tamsulosin (FLOMAX) 0.4 mg, Oral, Daily, Please make PCP appointment.  . TRUEplus Lancets 28G MISC USE AS INSTRUCTED    Observations/Objective:  Lab Results  Component Value Date  WBC 6.4 08/08/2019   HGB 11.7 (L) 08/08/2019   HCT 35.6 (L) 08/08/2019   MCV 87.5 08/08/2019   PLT 180 08/08/2019     Chemistry      Component Value Date/Time   NA 135 08/08/2019 0149   K 4.5 08/08/2019 0149   CL 103 08/08/2019 0149   CO2 23 08/08/2019 0149   BUN 34 (H) 08/08/2019 0149   CREATININE 1.86 (H) 08/08/2019 0149   CREATININE 1.18 03/26/2016 1502       Component Value Date/Time   CALCIUM 8.9 08/08/2019 0149   ALKPHOS 68 05/05/2018 0919   AST 19 05/05/2018 0919   ALT 10 05/05/2018 0919   BILITOT 0.7 05/05/2018 0919      Assessment and Plan: 1. Hospital discharge follow-up  2. 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  Dispense: 30 tablet; Refill: 1 - carvedilol (COREG) 25 MG tablet; Take 1 tablet (25 mg total) by mouth 2 (two) times daily with a meal. Please make PCP appointment.  Dispense: 60 tablet; Refill: 1 - clopidogrel (PLAVIX) 75 MG tablet; Take 1 tablet (75 mg total) by mouth daily. Please make PCP appointment.  Dispense: 30 tablet; Refill: 1 - isosorbide mononitrate (IMDUR) 120 MG 24 hr tablet; Take 1 tablet (120 mg total) by mouth daily. Please make PCP appointment.  Dispense: 30 tablet; Refill: 1 - nitroGLYCERIN (NITROSTAT) 0.4 MG SL tablet; Place 1 tablet (0.4 mg total) under the tongue every 5 (five) minutes as needed for chest pain (up to 3 doses. If taking 3rd dose call 911).  Dispense: 25 tablet; Refill: 2  3. Essential hypertension - amLODipine (NORVASC) 10 MG tablet; Take 1 tablet (10 mg total) by mouth daily. Please make PCP appointment.  Dispense: 30 tablet; Refill: 1 - hydrALAZINE (APRESOLINE) 25 MG tablet; Take 1 tablet (25 mg total) by mouth 2 (two) times daily.  Dispense: 60 tablet; Refill: 1  4. Type 2 diabetes mellitus with other circulatory complication, without long-term current use of insulin (HCC) There has been a decline in his GFR.  His last 2 A1c's have been below 6. I recommend decreasing Metformin from 500 mg twice a day to 500 mg once a day.  Follow-up with the lab in 1 week for recheck of BMP. - metFORMIN (GLUCOPHAGE) 500 MG tablet; Take 1 tablet (500 mg total) by mouth once a day.  Dispense: 60 tablet; Refill: 1  5. Gross hematuria Advised  patient of the importance of seeing the urologist to have this evaluated further - Ambulatory referral to Urology   Follow Up Instructions: With Dr. Margarita Rana in 2 months   I discussed the assessment and treatment plan with the patient. The patient was provided an opportunity to ask questions and all were answered. The patient agreed with the plan and demonstrated an understanding of the instructions.   The patient was advised to call back or seek an in-person evaluation if the symptoms worsen or if the condition fails to improve as anticipated.  I provided 8 minutes of non-face-to-face time during this encounter.   Karle Plumber, MD

## 2019-09-10 NOTE — Progress Notes (Signed)
Pt states his blood sugar this morning was 192

## 2019-09-15 MED FILL — hydrALAZINE HCL 25 MG TABS: 25 | 30 days supply | Qty: 60 | Fill #0

## 2019-11-02 ENCOUNTER — Other Ambulatory Visit: Payer: Self-pay | Admitting: Family Medicine

## 2019-11-02 DIAGNOSIS — I25710 Atherosclerosis of autologous vein coronary artery bypass graft(s) with unstable angina pectoris: Secondary | ICD-10-CM

## 2019-11-02 DIAGNOSIS — I10 Essential (primary) hypertension: Secondary | ICD-10-CM

## 2019-11-02 DIAGNOSIS — E1159 Type 2 diabetes mellitus with other circulatory complications: Secondary | ICD-10-CM

## 2019-11-02 MED ORDER — AMLODIPINE BESYLATE 10 MG PO TABS: 10.0000 mg | ORAL_TABLET | Freq: Every day | ORAL | 0 refills | Status: DC

## 2019-11-02 MED ORDER — HYDRALAZINE HCL 25 MG PO TABS: 25.0000 mg | ORAL_TABLET | Freq: Two times a day (BID) | ORAL | 0 refills | Status: DC

## 2019-11-02 MED ORDER — CLOPIDOGREL BISULFATE 75 MG PO TABS: 75.0000 mg | ORAL_TABLET | Freq: Every day | ORAL | 0 refills | Status: DC

## 2019-11-02 MED ORDER — ATORVASTATIN CALCIUM 80 MG PO TABS: 80.0000 mg | ORAL_TABLET | Freq: Every evening | ORAL | 0 refills | Status: DC

## 2019-11-02 MED ORDER — CARVEDILOL 25 MG PO TABS: 25.0000 mg | ORAL_TABLET | Freq: Two times a day (BID) | ORAL | 0 refills | Status: DC

## 2019-11-02 MED ORDER — METFORMIN HCL 500 MG PO TABS: 500.0000 mg | ORAL_TABLET | Freq: Every day | ORAL | 0 refills | Status: DC

## 2019-11-02 MED ORDER — TAMSULOSIN HCL 0.4 MG PO CAPS: 0.4000 mg | ORAL_CAPSULE | Freq: Every day | ORAL | 0 refills | Status: DC

## 2019-11-02 MED ORDER — ISOSORBIDE MONONITRATE ER 120 MG PO TB24: 120.0000 mg | ORAL_TABLET | Freq: Every day | ORAL | 0 refills | Status: DC

## 2019-11-02 MED FILL — CARVEDILOL 25 MG TABLET: 25 | 30 days supply | Qty: 60 | Fill #0

## 2019-11-02 MED FILL — TAMSULOSIN HCL 0.4 MG CAP: 0.4 | 30 days supply | Qty: 30 | Fill #0

## 2019-11-02 MED FILL — NITROGLYCERIN 0.4 MG TAB SL: 0.4 | 21 days supply | Qty: 25 | Fill #0

## 2019-11-02 MED FILL — AMLODIPINE BESYLATE 10 MG T: 10 | 30 days supply | Qty: 30 | Fill #0

## 2019-11-02 MED FILL — CLOPIDOGREL 75 MG TABLET: 75 | 30 days supply | Qty: 30 | Fill #0

## 2019-11-02 MED FILL — hydrALAZINE HCL 25 MG TABS: 25 | 30 days supply | Qty: 60 | Fill #0

## 2019-11-02 MED FILL — ISOSORBIDE MN 120 MG TAB SA: 120 | 30 days supply | Qty: 30 | Fill #0

## 2019-11-02 MED FILL — METFORMIN HCL 500 MG TABS: 500 | 30 days supply | Qty: 30 | Fill #1

## 2019-11-02 MED FILL — ATORVASTATIN CALCIUM 80 MG: 80 | 30 days supply | Qty: 30 | Fill #0

## 2019-11-02 NOTE — Telephone Encounter (Signed)
Patient has appointment- 11/11/19. Rx refill per protocol.

## 2019-11-02 NOTE — Telephone Encounter (Signed)
Copied from CRM [id number]. Topic: Quick Communication - Rx Refill/Question >> Nov 02, 2019  2:19 PM Jaquita Rector A wrote: Medication: tamsulosin (FLOMAX) 0.4 MG CAPS capsule, nitroGLYCERIN (NITROSTAT) 0.4 MG SL tablet, metFORMIN (GLUCOPHAGE) 500 MG tablet, isosorbide mononitrate (IMDUR) 120 MG 24 hr tablet, hydrALAZINE (APRESOLINE) 25 MG tablet, clopidogrel (PLAVIX) 75 MG tablet, carvedilol (COREG) 25 MG tablet, atorvastatin (LIPITOR) 80 MG tablet,amLODipine (NORVASC) 10 MG tablet Per wife patient is completely out of medications   Has the patient contacted their pharmacy? Yes.   (Agent: If no, request that the patient contact the pharmacy for the refill.) (Agent: If yes, when and what did the pharmacy advise?)  Preferred Pharmacy (with phone number or street name): [facility] - [place], [place] [address]  Phone:  [phone] Fax:  [phone]     Agent: Please be advised that RX refills may take up to 3 business days. We ask that you follow-up with your pharmacy.

## 2019-11-11 ENCOUNTER — Ambulatory Visit: Payer: Medicare Other | Admitting: Family Medicine

## 2019-12-22 MED FILL — NITROGLYCERIN 0.4 MG TAB SL: 0.4 | 21 days supply | Qty: 25 | Fill #1

## 2019-12-22 MED FILL — ATORVASTATIN CALCIUM 80 MG: 80 | 30 days supply | Qty: 30 | Fill #1

## 2019-12-22 MED FILL — ISOSORBIDE MN 120 MG TAB SA: 120 | 30 days supply | Qty: 30 | Fill #1

## 2019-12-22 MED FILL — METFORMIN HCL 500 MG TABS: 500 | 30 days supply | Qty: 30 | Fill #0

## 2019-12-22 MED FILL — CARVEDILOL 25 MG TABLET: 25 | 30 days supply | Qty: 60 | Fill #1

## 2019-12-22 MED FILL — TAMSULOSIN HCL 0.4 MG CAP: 0.4 | 30 days supply | Qty: 30 | Fill #1

## 2019-12-22 MED FILL — AMLODIPINE BESYLATE 10 MG T: 10 | 30 days supply | Qty: 30 | Fill #1

## 2019-12-22 MED FILL — CLOPIDOGREL 75 MG TABLET: 75 | 30 days supply | Qty: 30 | Fill #1

## 2019-12-22 MED FILL — hydrALAZINE HCL 25 MG TABS: 25 | 30 days supply | Qty: 60 | Fill #1

## 2020-04-29 ENCOUNTER — Other Ambulatory Visit: Payer: Self-pay

## 2020-05-29 ENCOUNTER — Emergency Department (HOSPITAL_COMMUNITY): Payer: Medicare Other

## 2020-05-29 ENCOUNTER — Inpatient Hospital Stay (HOSPITAL_COMMUNITY)
Admission: EM | Admit: 2020-05-29 | Discharge: 2020-05-31 | DRG: 281 | Disposition: A | Discharge status: Home / Self Care | Payer: Medicare Other | Attending: Internal Medicine | Admitting: Internal Medicine

## 2020-05-29 ENCOUNTER — Other Ambulatory Visit: Payer: Self-pay

## 2020-05-29 ENCOUNTER — Encounter (HOSPITAL_COMMUNITY): Payer: Self-pay | Admitting: Internal Medicine

## 2020-05-29 DIAGNOSIS — N1831 Chronic kidney disease, stage 3a: Secondary | ICD-10-CM | POA: Diagnosis present

## 2020-05-29 DIAGNOSIS — I10 Essential (primary) hypertension: Secondary | ICD-10-CM

## 2020-05-29 DIAGNOSIS — R000 Tachycardia, unspecified: Secondary | ICD-10-CM | POA: Diagnosis not present

## 2020-05-29 DIAGNOSIS — Z8249 Family history of ischemic heart disease and other diseases of the circulatory system: Secondary | ICD-10-CM

## 2020-05-29 DIAGNOSIS — E1122 Type 2 diabetes mellitus with diabetic chronic kidney disease: Secondary | ICD-10-CM | POA: Diagnosis present

## 2020-05-29 DIAGNOSIS — I214 Non-ST elevation (NSTEMI) myocardial infarction: Secondary | ICD-10-CM | POA: Diagnosis not present

## 2020-05-29 DIAGNOSIS — I5032 Chronic diastolic (congestive) heart failure: Secondary | ICD-10-CM | POA: Diagnosis present

## 2020-05-29 DIAGNOSIS — E785 Hyperlipidemia, unspecified: Secondary | ICD-10-CM | POA: Diagnosis present

## 2020-05-29 DIAGNOSIS — I77810 Thoracic aortic ectasia: Secondary | ICD-10-CM | POA: Diagnosis present

## 2020-05-29 DIAGNOSIS — I160 Hypertensive urgency: Secondary | ICD-10-CM | POA: Diagnosis present

## 2020-05-29 DIAGNOSIS — E1151 Type 2 diabetes mellitus with diabetic peripheral angiopathy without gangrene: Secondary | ICD-10-CM | POA: Diagnosis present

## 2020-05-29 DIAGNOSIS — I200 Unstable angina: Secondary | ICD-10-CM

## 2020-05-29 DIAGNOSIS — I130 Hypertensive heart and chronic kidney disease with heart failure and stage 1 through stage 4 chronic kidney disease, or unspecified chronic kidney disease: Secondary | ICD-10-CM | POA: Diagnosis present

## 2020-05-29 DIAGNOSIS — Z7984 Long term (current) use of oral hypoglycemic drugs: Secondary | ICD-10-CM

## 2020-05-29 DIAGNOSIS — Z9114 Patient's other noncompliance with medication regimen: Secondary | ICD-10-CM

## 2020-05-29 DIAGNOSIS — R778 Other specified abnormalities of plasma proteins: Secondary | ICD-10-CM

## 2020-05-29 DIAGNOSIS — I252 Old myocardial infarction: Secondary | ICD-10-CM

## 2020-05-29 DIAGNOSIS — B888 Other specified infestations: Secondary | ICD-10-CM | POA: Diagnosis present

## 2020-05-29 DIAGNOSIS — Z87891 Personal history of nicotine dependence: Secondary | ICD-10-CM

## 2020-05-29 DIAGNOSIS — Z833 Family history of diabetes mellitus: Secondary | ICD-10-CM

## 2020-05-29 DIAGNOSIS — Z951 Presence of aortocoronary bypass graft: Secondary | ICD-10-CM

## 2020-05-29 DIAGNOSIS — I161 Hypertensive emergency: Principal | ICD-10-CM | POA: Diagnosis present

## 2020-05-29 DIAGNOSIS — Z7902 Long term (current) use of antithrombotics/antiplatelets: Secondary | ICD-10-CM

## 2020-05-29 DIAGNOSIS — Z7982 Long term (current) use of aspirin: Secondary | ICD-10-CM

## 2020-05-29 DIAGNOSIS — D649 Anemia, unspecified: Secondary | ICD-10-CM | POA: Diagnosis present

## 2020-05-29 DIAGNOSIS — I2510 Atherosclerotic heart disease of native coronary artery without angina pectoris: Secondary | ICD-10-CM | POA: Diagnosis present

## 2020-05-29 DIAGNOSIS — Z20822 Contact with and (suspected) exposure to covid-19: Secondary | ICD-10-CM | POA: Diagnosis not present

## 2020-05-29 DIAGNOSIS — K219 Gastro-esophageal reflux disease without esophagitis: Secondary | ICD-10-CM | POA: Diagnosis present

## 2020-05-29 DIAGNOSIS — Z599 Problem related to housing and economic circumstances, unspecified: Secondary | ICD-10-CM

## 2020-05-29 DIAGNOSIS — R0789 Other chest pain: Secondary | ICD-10-CM | POA: Diagnosis not present

## 2020-05-29 DIAGNOSIS — R079 Chest pain, unspecified: Secondary | ICD-10-CM | POA: Diagnosis not present

## 2020-05-29 DIAGNOSIS — R0902 Hypoxemia: Secondary | ICD-10-CM | POA: Diagnosis not present

## 2020-05-29 DIAGNOSIS — Z79899 Other long term (current) drug therapy: Secondary | ICD-10-CM

## 2020-05-29 DIAGNOSIS — Z841 Family history of disorders of kidney and ureter: Secondary | ICD-10-CM

## 2020-05-29 LAB — HEMOGLOBIN A1C
Hgb A1c MFr Bld: 5.4 % (ref 4.8–5.6)
Mean Plasma Glucose: 108.28 mg/dL

## 2020-05-29 LAB — CBC WITH DIFFERENTIAL/PLATELET
Abs Immature Granulocytes: 0.04 10*3/uL (ref 0.00–0.07)
Basophils Absolute: 0 10*3/uL (ref 0.0–0.1)
Basophils Relative: 0 %
Eosinophils Absolute: 0.1 10*3/uL (ref 0.0–0.5)
Eosinophils Relative: 2 %
HCT: 38.1 % — ABNORMAL LOW (ref 39.0–52.0)
Hemoglobin: 11.6 g/dL — ABNORMAL LOW (ref 13.0–17.0)
Immature Granulocytes: 1 %
Lymphocytes Relative: 14 %
Lymphs Abs: 0.9 10*3/uL (ref 0.7–4.0)
MCH: 27.1 pg (ref 26.0–34.0)
MCHC: 30.4 g/dL (ref 30.0–36.0)
MCV: 89 fL (ref 80.0–100.0)
Monocytes Absolute: 0.5 10*3/uL (ref 0.1–1.0)
Monocytes Relative: 7 %
Neutro Abs: 5 10*3/uL (ref 1.7–7.7)
Neutrophils Relative %: 76 %
Platelets: 212 10*3/uL (ref 150–400)
RBC: 4.28 MIL/uL (ref 4.22–5.81)
RDW: 14.7 % (ref 11.5–15.5)
WBC: 6.5 10*3/uL (ref 4.0–10.5)
nRBC: 0 % (ref 0.0–0.2)

## 2020-05-29 LAB — HEPARIN LEVEL (UNFRACTIONATED): Heparin Unfractionated: 0.31 IU/mL (ref 0.30–0.70)

## 2020-05-29 LAB — BASIC METABOLIC PANEL
Anion gap: 10 (ref 5–15)
BUN: 23 mg/dL (ref 8–23)
CO2: 22 mmol/L (ref 22–32)
Calcium: 8.4 mg/dL — ABNORMAL LOW (ref 8.9–10.3)
Chloride: 106 mmol/L (ref 98–111)
Creatinine, Ser: 1.68 mg/dL — ABNORMAL HIGH (ref 0.61–1.24)
GFR, Estimated: 45 mL/min — ABNORMAL LOW (ref >60)
Glucose, Bld: 148 mg/dL — ABNORMAL HIGH (ref 70–99)
Potassium: 4.8 mmol/L (ref 3.5–5.1)
Sodium: 138 mmol/L (ref 135–145)

## 2020-05-29 LAB — PROTIME-INR
INR: 1.1 (ref 0.8–1.2)
Prothrombin Time: 14.4 seconds (ref 11.4–15.2)

## 2020-05-29 LAB — GLUCOSE, CAPILLARY: Glucose-Capillary: 97 mg/dL (ref 70–99)

## 2020-05-29 LAB — TROPONIN I (HIGH SENSITIVITY)
Troponin I (High Sensitivity): 34 ng/L — ABNORMAL HIGH (ref <18)
Troponin I (High Sensitivity): 251 ng/L (ref <18)

## 2020-05-29 LAB — TSH: TSH: 1.151 u[IU]/mL (ref 0.350–4.500)

## 2020-05-29 MED ORDER — METOPROLOL TARTRATE 25 MG PO TABS
50.0000 mg | ORAL_TABLET | Freq: Once | ORAL | Status: AC
  Administered 2020-05-29: 50 mg via ORAL
  Filled 2020-05-29: qty 2

## 2020-05-29 MED ORDER — AMLODIPINE BESYLATE 10 MG PO TABS
10.0000 mg | ORAL_TABLET | Freq: Every day | ORAL | Status: DC
  Administered 2020-05-30 – 2020-05-31 (×2): 10 mg via ORAL
  Filled 2020-05-29 (×2): qty 1

## 2020-05-29 MED ORDER — NITROGLYCERIN 0.4 MG SL SUBL
0.4000 mg | SUBLINGUAL_TABLET | SUBLINGUAL | Status: DC | PRN
  Administered 2020-05-29: 0.4 mg via SUBLINGUAL
  Filled 2020-05-29: qty 1

## 2020-05-29 MED ORDER — SENNOSIDES-DOCUSATE SODIUM 8.6-50 MG PO TABS: 1.0000 | ORAL_TABLET | Freq: Every evening | ORAL | Status: DC | PRN

## 2020-05-29 MED ORDER — ISOSORBIDE MONONITRATE ER 60 MG PO TB24
120.0000 mg | ORAL_TABLET | Freq: Every day | ORAL | Status: DC
  Administered 2020-05-29 – 2020-05-31 (×3): 120 mg via ORAL
  Filled 2020-05-29 (×2): qty 2
  Filled 2020-05-29: qty 4

## 2020-05-29 MED ORDER — HEPARIN SODIUM (PORCINE) 5000 UNIT/ML IJ SOLN: 4000.0000 [IU] | Freq: Once | INTRAMUSCULAR | Status: DC

## 2020-05-29 MED ORDER — CLOPIDOGREL BISULFATE 75 MG PO TABS
75.0000 mg | ORAL_TABLET | Freq: Every day | ORAL | Status: DC
  Administered 2020-05-30 – 2020-05-31 (×2): 75 mg via ORAL
  Filled 2020-05-29 (×2): qty 1

## 2020-05-29 MED ORDER — ACETAMINOPHEN 650 MG RE SUPP: 650.0000 mg | Freq: Four times a day (QID) | RECTAL | Status: DC | PRN

## 2020-05-29 MED ORDER — ACETAMINOPHEN 325 MG PO TABS: 650.0000 mg | ORAL_TABLET | Freq: Four times a day (QID) | ORAL | Status: DC | PRN

## 2020-05-29 MED ORDER — CARVEDILOL 25 MG PO TABS
25.0000 mg | ORAL_TABLET | Freq: Two times a day (BID) | ORAL | Status: DC
  Administered 2020-05-29 – 2020-05-31 (×4): 25 mg via ORAL
  Filled 2020-05-29: qty 8
  Filled 2020-05-29 (×3): qty 1

## 2020-05-29 MED ORDER — TAMSULOSIN HCL 0.4 MG PO CAPS
0.4000 mg | ORAL_CAPSULE | Freq: Every day | ORAL | Status: DC
  Administered 2020-05-30 – 2020-05-31 (×2): 0.4 mg via ORAL
  Filled 2020-05-29 (×2): qty 1

## 2020-05-29 MED ORDER — HEPARIN BOLUS VIA INFUSION
4000.0000 [IU] | Freq: Once | INTRAVENOUS | Status: AC
  Administered 2020-05-29: 4000 [IU] via INTRAVENOUS
  Filled 2020-05-29: qty 4000

## 2020-05-29 MED ORDER — HEPARIN (PORCINE) 25000 UT/250ML-% IV SOLN: 16.0000 [IU]/kg/h | INTRAVENOUS | Status: DC

## 2020-05-29 MED ORDER — AMLODIPINE BESYLATE 5 MG PO TABS
10.0000 mg | ORAL_TABLET | Freq: Once | ORAL | Status: AC
  Administered 2020-05-29: 10 mg via ORAL
  Filled 2020-05-29: qty 2

## 2020-05-29 MED ORDER — ATORVASTATIN CALCIUM 80 MG PO TABS
80.0000 mg | ORAL_TABLET | Freq: Every evening | ORAL | Status: DC
  Administered 2020-05-30: 80 mg via ORAL
  Filled 2020-05-29: qty 1

## 2020-05-29 MED ORDER — INSULIN ASPART 100 UNIT/ML IJ SOLN
0.0000 [IU] | Freq: Three times a day (TID) | INTRAMUSCULAR | Status: DC
  Administered 2020-05-30: 1 [IU] via SUBCUTANEOUS
  Administered 2020-05-30: 2 [IU] via SUBCUTANEOUS
  Administered 2020-05-31: 1 [IU] via SUBCUTANEOUS

## 2020-05-29 MED ORDER — HYDRALAZINE HCL 25 MG PO TABS
25.0000 mg | ORAL_TABLET | Freq: Two times a day (BID) | ORAL | Status: DC
  Administered 2020-05-29 – 2020-05-30 (×2): 25 mg via ORAL
  Filled 2020-05-29 (×2): qty 1

## 2020-05-29 MED ORDER — LABETALOL HCL 5 MG/ML IV SOLN: 5.0000 mg | Freq: Once | INTRAVENOUS | Status: DC | PRN

## 2020-05-29 MED ORDER — ASPIRIN 81 MG PO CHEW: 324.0000 mg | CHEWABLE_TABLET | Freq: Once | ORAL | Status: AC

## 2020-05-29 MED ORDER — HEPARIN (PORCINE) 25000 UT/250ML-% IV SOLN
1700.0000 [IU]/h | INTRAVENOUS | Status: DC
  Administered 2020-05-29: 1400 [IU]/h via INTRAVENOUS
  Administered 2020-05-30: 1700 [IU]/h via INTRAVENOUS
  Filled 2020-05-29 (×2): qty 250

## 2020-05-29 NOTE — ED Provider Notes (Signed)
[facility] EMERGENCY DEPARTMENT Provider Note   CSN: [id number] Arrival date & time: 05/29/20  1004     History Chief Complaint  Patient presents with  . Chest Pain    Darrell [name] is a 65 y.o. male.  Patient history of prior MIs with cardiac stents, however has been off his Plavix and blood pressure medications as he states he ran out.  His presenting chief complaint today is chest pain.  States that it came on prior to arrival while at rest.  Was mid chest lasted about 15 minutes and then resolved after taking some nitro.  No radiation of pain no diaphoresis or palpitations reported.  Currently denies any chest pain or chest discomfort.        Past Medical History:  Diagnosis Date  . CKD (chronic kidney disease), stage III   . Claudication ([place])   . Coronary artery disease    a. 08/2001 s/p DES to RCA/RI;  b. 07/2006 s/p DES to p/d LCX;  c. 11/2007 CABGx5 (LIMA->LAD, VG->D1, LRA->OM1, VG->PDA->LPL); c. 03/2015: Synergy DES to mid-Cx in the setting of NSTEMI; d. 11/2015 NSTEMI->Med Rx. e. NSTEMI 01/2018 -> med rx.  . Diabetes mellitus type 2 in nonobese ([place])    a. A1c 7.0 09/2012.  . Diastolic dysfunction    a. 02/2016 Echo: EF 50-55%, gr1DD, Ao sclerosis, dil Ao root (31m), sev dil LA, triv TR, PASP 266mg.  . Marland Kitchenyperlipidemia   . Hypertension   . Noncompliance   . Right carotid bruit    a. noted 01/2018, will need OP eval when patient complies with follow-up.    Patient Active Problem List   Diagnosis Date Noted  . Hematuria   . Bleeding gums 08/01/2018  . Ischemic chest pain ([facility]CD[facility]07/03/2018  . Renal insufficiency   . ACS (acute coronary syndrome) ([facility]04/05/2018  . Mild anemia 02/08/2018  . CKD (chronic kidney disease), stage III ([facility]  . Diabetes mellitus type 2 in nonobese (HCC)   . Hyperlipidemia with target LDL less than 70   . Hypertension   . Right carotid bruit   . Sepsis ([facility]02/03/2016  . Chest pain   . Hyponatremia 01/03/2016   . Hypertensive emergency   . Non-ST elevation (NSTEMI) myocardial infarction (HCGantt03/07/2015  . HTN (hypertension) 04/04/2015  . Hypertensive urgency 03/25/2014  . History of noncompliance with medical treatment, presenting hazards to health 03/25/2014  . Claudication ([facility]10/01/2012  . GERD 10/23/2007  . NEPHROLITHIASIS 10/23/2007  . HYPERCHOLESTEROLEMIA 10/22/2007  . ADJUSTMENT DISORDER WITH DEPRESSED MOOD 10/22/2007  . Type 2 diabetes mellitus without complication, without long-term current use of insulin ([facility]CP[facility]01/01/2003  . Coronary atherosclerosis, s/p CABG in 2009  01/28/2001    Past Surgical History:  Procedure Laterality Date  . CARDIAC CATHETERIZATION N/A 04/06/2015   Procedure: Left Heart Cath and Cors/Grafts Angiography;  Surgeon: JoLorretta HarpMD;  [facility];  Service: Cardiovascular;  Laterality: N/A;  . CARDIAC CATHETERIZATION N/A 04/06/2015   Procedure: Coronary Stent Intervention;  Surgeon: JoLorretta HarpMD;  2.5 mm x 12 mm long Synergy DES followed by  2.5 mm x 16 mm long Synergy DES    . CORONARY ARTERY BYPASS GRAFT     2009 LIMA to LAD, SVG to Diag, SVG to PDA and PL, left radial to OM  . CORONARY BALLOON ANGIOPLASTY N/A 05/04/2018   Procedure: CORONARY BALLOON ANGIOPLASTY;  Surgeon: BeLorretta HarpMD;  [facility];  Service: Cardiovascular;  Laterality: N/A;  Prox CFX  . LEFT HEART CATH N/A 10/24/2012   Procedure: LEFT HEART CATH;  Surgeon: Troy Sine, MD;  Location: [facility] CATH LAB;  Service: Cardiovascular;  Laterality: N/A;  . LEFT HEART CATH AND CORS/GRAFTS ANGIOGRAPHY N/A 05/04/2018   Procedure: LEFT HEART CATH AND CORS/GRAFTS ANGIOGRAPHY;  Surgeon: Lorretta Harp, MD;  Location: [facility] CV LAB;  Service: Cardiovascular;  Laterality: N/A;       Family History  Problem Relation Age of Onset  . Heart failure Mother   . Cancer Mother   . CAD Mother 68  . Heart failure Father   . Kidney failure Brother      Social History   Tobacco Use  . Smoking status: Former Smoker    Quit date: 10/25/2010    Years since quitting: 9.6  . Smokeless tobacco: Never Used  Vaping Use  . Vaping Use: Never used  Substance Use Topics  . Alcohol use: No    Comment: sts he drank for the first time in a long time last night   . Drug use: No    Home Medications Prior to Admission medications   Medication Sig Start Date End Date Taking? Authorizing Provider  amLODipine (NORVASC) 10 MG tablet Take 1 tablet (10 mg total) by mouth daily. Please make PCP appointment. 11/02/19   [name], MD  atorvastatin (LIPITOR) 80 MG tablet Take 1 tablet (80 mg total) by mouth every evening. 11/02/19   [name], MD  Blood Glucose Monitoring Suppl (TRUE METRIX METER) w/Device KIT USE AS INSTRUCTED Patient taking differently: 1 each by Other route as directed. 07/05/16   [name], MD  carvedilol (COREG) 25 MG tablet Take 1 tablet (25 mg total) by mouth 2 (two) times daily with a meal. Please make PCP appointment. 11/02/19   [name], MD  clopidogrel (PLAVIX) 75 MG tablet Take 1 tablet (75 mg total) by mouth daily. Please make PCP appointment. 11/02/19   [name], MD  glucose blood (TRUE METRIX BLOOD GLUCOSE TEST) test strip 1 each by Other route 3 (three) times daily. 04/27/19   Mayers, Cari S, PA-C  hydrALAZINE (APRESOLINE) 25 MG tablet Take 1 tablet (25 mg total) by mouth 2 (two) times daily. 11/02/19   [name], MD  isosorbide mononitrate (IMDUR) 120 MG 24 hr tablet Take 1 tablet (120 mg total) by mouth daily. Please make PCP appointment. 11/02/19   [name], MD  Lancet Devices [facility]) lancets Use as instructed daily. Patient taking differently: 1 each by Other route as directed. 07/05/16   [name], MD  metFORMIN (GLUCOPHAGE) 500 MG tablet TAKE 1 TABLET (500 MG TOTAL) BY MOUTH DAILY WITH BREAKFAST. Patient taking differently: Take 500 mg by mouth daily with breakfast.  11/02/19 11/01/20  [name], MD  nitroGLYCERIN (NITROSTAT) 0.4 MG SL tablet PLACE 1 TABLET UNDER THE TONGUE EVERY 5 MINUTES AS NEEDED FOR CHEST PAIN (UP TO 3 DOSES. IF TAKING 3RD DOSE CALL 911) Patient taking differently: Place 0.4 mg under the tongue every 5 (five) minutes as needed for chest pain. 09/10/19 09/09/20  Ladell Pier, MD  tamsulosin (FLOMAX) 0.4 MG CAPS capsule Take 1 capsule (0.4 mg total) by mouth daily. Please make PCP appointment. 11/02/19   [name], MD  TRUEplus Lancets 28G MISC USE AS INSTRUCTED Patient taking differently: 1 each by Other route as directed. 04/27/19   Mayers, Loraine Grip, PA-C    Allergies    Patient has  no known allergies.  Review of Systems   Review of Systems  Constitutional: Negative for fever.  HENT: Negative for ear pain and sore throat.   Eyes: Negative for pain.  Respiratory: Negative for cough.   Cardiovascular: Positive for chest pain.  Gastrointestinal: Negative for abdominal pain.  Genitourinary: Negative for flank pain.  Musculoskeletal: Negative for back pain.  Skin: Negative for color change and rash.  Neurological: Negative for syncope.  All other systems reviewed and are negative.   Physical Exam Updated Vital Signs BP (!) 164/92   Pulse 63   Temp (!) 97.3 F (36.3 C) (Tympanic)   Resp (!) 21   Ht _0  (1.854 m)   Wt 106.1 kg   SpO2 99%   BMI 30.87 kg/m   Physical Exam Constitutional:      General: He is not in acute distress.    Appearance: He is well-developed.  HENT:     Head: Normocephalic.     Nose: Nose normal.  Eyes:     Extraocular Movements: Extraocular movements intact.  Cardiovascular:     Rate and Rhythm: Normal rate.  Pulmonary:     Effort: Pulmonary effort is normal.  Skin:    Coloration: Skin is not jaundiced.  Neurological:     Mental Status: He is alert. Mental status is at baseline.     ED Results / Procedures / Treatments   Labs (all labs ordered are listed, but only  abnormal results are displayed) Labs Reviewed  CBC WITH DIFFERENTIAL/PLATELET - Abnormal; Notable for the following components:      Result Value   Hemoglobin 11.6 (*)    HCT 38.1 (*)    All other components within normal limits  BASIC METABOLIC PANEL - Abnormal; Notable for the following components:   Glucose, Bld 148 (*)    Creatinine, Ser 1.68 (*)    Calcium 8.4 (*)    GFR, Estimated 45 (*)    All other components within normal limits  TROPONIN I (HIGH SENSITIVITY) - Abnormal; Notable for the following components:   Troponin I (High Sensitivity) 34 (*)    All other components within normal limits  TROPONIN I (HIGH SENSITIVITY) - Abnormal; Notable for the following components:   Troponin I (High Sensitivity) 251 (*)    All other components within normal limits  PROTIME-INR    EKG None  Radiology DG Chest Port 1 View  Result Date: 05/29/2020 CLINICAL DATA:  Chest pain EXAM: PORTABLE CHEST 1 VIEW COMPARISON:  08/06/2019 FINDINGS: Cardiac shadow is enlarged but stable. Postsurgical changes are again seen. The lungs are well aerated bilaterally. No focal infiltrate or effusion is noted. No bony abnormality is seen. IMPRESSION: No acute abnormality noted. Electronically Signed   By: Inez Catalina M.D.   On: 05/29/2020 11:26    Procedures .Critical Care Performed by: Luna Fuse, MD Authorized by: Luna Fuse, MD   Critical care provider statement:    Critical care time (minutes):  45   Critical care time was exclusive of:  Separately billable procedures and treating other patients and teaching time   Critical care was necessary to treat or prevent imminent or life-threatening deterioration of the following conditions:  Cardiac failure   Critical care was time spent personally by me on the following activities:  Discussions with consultants, evaluation of patient's response to treatment, examination of patient, ordering and performing treatments and interventions, ordering and  review of laboratory studies, ordering and review of radiographic studies, pulse oximetry,  re-evaluation of patient's condition, obtaining history from patient or surrogate and review of old charts     Medications Ordered in ED Medications  nitroGLYCERIN (NITROSTAT) SL tablet 0.4 mg (0.4 mg Sublingual Given 05/29/20 1254)  heparin ADULT infusion 100 units/mL (25000 units/278m) (has no administration in time range)  heparin injection 4,000 Units (has no administration in time range)  amLODipine (NORVASC) tablet 10 mg (10 mg Oral Given 05/29/20 1104)  metoprolol tartrate (LOPRESSOR) tablet 50 mg (50 mg Oral Given 05/29/20 1233)  aspirin chewable tablet 324 mg (324 mg Oral Given by EMS 05/29/20 1230)    ED Course  I have reviewed the triage vital signs and the nursing notes.  Pertinent labs & imaging results that were available during my care of the patient were reviewed by me and considered in my medical decision making (see chart for details).    MDM Rules/Calculators/A&P                          Patient was pain-free for some time, however had recurrence of chest pain here in the ER.  Initial troponin was mildly elevated.  Second appears much more so elevated.  Patient given nitroglycerin with subsequent resolution of pain again.  Cardiology has been consulted for admission, patient already received 324 aspirin orally prior to arrival from EMS.  Started on heparin bolus and drip here in the ER.  Final Clinical Impression(s) / ED Diagnoses Final diagnoses:  Non-ST elevation MI (NSTEMI) ([facility]    Rx / DC Orders ED Discharge Orders    None       HLuna Fuse MD 05/29/20 1408

## 2020-05-29 NOTE — Progress Notes (Incomplete)
2D Echo attempted, but patient was not in trauma room A. Will try echo later.

## 2020-05-29 NOTE — Progress Notes (Addendum)
ANTICOAGULATION CONSULT NOTE   Pharmacy Consult for heparin Indication: chest pain/ACS   Labs: Recent Labs    05/29/20 1016 05/29/20 1240 05/29/20 1700 05/29/20 2244  HGB 11.6*  --   --   --   HCT 38.1*  --   --   --   PLT 212  --   --   --   LABPROT  --   --  14.4  --   INR  --   --  1.1  --   HEPARINUNFRC  --   --   --  0.31  CREATININE 1.68*  --   --   --   TROPONINIHS 34* 251*  --   --     Assessment: 65 yom with past history of MI s/p stents who has been off Plavix and BP medications due to running out of them. He presented today with chest pain that resolved with nitroglycerin.  Initial heparin level 0.31 units/ml Goal of Therapy:  Heparin level 0.3-0.7 units/ml Monitor platelets by anticoagulation protocol: Yes   Plan:  Increase heparin to 1500 units/hr  Daily heparin level and CBC  Monitor s/sx of bleed  Thanks for allowing pharmacy to be a part of this patient's care.  Talbert Cage, PharmD Clinical Pharmacist 05/29/2020 11:21 PM  Addum:  Heparin level this am 0.26 units/ml.  Level was drawn ~ 2.5 hr after rate increase.  Will increase heparin again to 1700 units/hr and check levelin 6 hours

## 2020-05-29 NOTE — Progress Notes (Signed)
ANTICOAGULATION CONSULT NOTE - Initial Consult  Pharmacy Consult for heparin Indication: chest pain/ACS  No Known Allergies  Patient Measurements: Height: 6\' 1"  (185.4 cm) Weight: 106.1 kg (234 lb) IBW/kg (Calculated) : 79.9 Heparin Dosing Weight: 101.8 kg  Vital Signs: Temp: 97.3 F (36.3 C) (05/02 1012) Temp Source: Tympanic (05/02 1012) BP: 164/92 (05/02 1345) Pulse Rate: 63 (05/02 1345)  Labs: Recent Labs    05/29/20 1016 05/29/20 1240  HGB 11.6*  --   HCT 38.1*  --   PLT 212  --   CREATININE 1.68*  --   TROPONINIHS 34* 251*    Estimated Creatinine Clearance: 57.5 mL/min (A) (by C-G formula based on SCr of 1.68 mg/dL (H)).   Medical History: Past Medical History:  Diagnosis Date  . CKD (chronic kidney disease), stage III   . Claudication ([facility])   . Coronary artery disease    a. 08/2001 s/p DES to RCA/RI;  b. 07/2006 s/p DES to p/d LCX;  c. 11/2007 CABGx5 (LIMA->LAD, VG->D1, LRA->OM1, VG->PDA->LPL); c. 03/2015: Synergy DES to mid-Cx in the setting of NSTEMI; d. 11/2015 NSTEMI->Med Rx. e. NSTEMI 01/2018 -> med rx.  . Diabetes mellitus type 2 in nonobese (HCC)    a. A1c 7.0 09/2012.  . Diastolic dysfunction    a. 02/2016 Echo: EF 50-55%, gr1DD, Ao sclerosis, dil Ao root (75mm), sev dil LA, triv TR, PASP 59m.  Hyperlipidemia   . Hypertension   . Noncompliance   . Right carotid bruit    a. noted 01/2018, will need OP eval when patient complies with follow-up.    Assessment: 55 yom with past history of MI s/p stents who has been off Plavix and BP medications due to running out of them. He presented today with chest pain that resolved with nitroglycerin.  Goal of Therapy:  Heparin level 0.3-0.7 units/ml Monitor platelets by anticoagulation protocol: Yes   Plan:  Heparin bolus 4000 units x1 Followed by Heparin 1400 units/hr  8-hours heparin level  Daily heparin level and CBC  Monitor s/sx of bleed  65, PharmD, [facility]  Resident [phone] 05/29/2020 2:35 PM

## 2020-05-29 NOTE — ED Notes (Addendum)
Attempted to give report, this rn asked to call back in .

## 2020-05-29 NOTE — Consult Note (Addendum)
The patient [name] been seen in conjunction with Darrell [name], PAC. All aspects of care have been considered and discussed. The patient [name] been personally interviewed, examined, and all clinical data [name] been reviewed.   Darrell [name] presents because of chest pain.  Darrell [name] is infested with bedbugs.  The chest pain was severe on presentation and currently [name] completely resolved.  [name] been off medications for days to weeks due to expired prescriptions.  Extreme blood pressure elevation on presentation.  No rub or gallop is heard on auscultation.  Presenting EKG is nonischemic.  Elevated troponin I with a delta to a peak so far of 251 from 34.  Accelerated hypertension due to poor/non medication compliance--> recommend resuming standard regimen that was being used prior to discontinuation. Elevated troponin I with prolonged angina: Likely type II injury due to medication discontinuation and stress of severely elevated blood pressure.  Chest pain is already improved with better blood pressure control.  Recommend repeat echocardiogram to exclude deterioration in LV function.  If LVEF remains stable compared to prior and symptoms remain resolved, no further work-up will be indicated.  Darrell [name] needs to be admitted to safely resume therapy and ensure a reasonable outcome.  This can be done on internal medicine service.  Cardiology Consultation:   Patient ID: Darrell [name] MRN: [id number]; DOB: 07-Nov-1955  Admit date: 05/29/2020 Date of Consult: 05/29/2020  PCP:  Darrell [name], [facility]  Cardiologist:  Minus Breeding, MD   Patient Profile:   Darrell [name] is a 65 y.o. male with a hx of CAD s/p CABG and PCI's, HTN, HLD, DM, hx of bed bugs, CKD stage III, chronic diastolic heart failure, and noncompliance due to financial issues who is being seen today for the evaluation of chest pain at the request of Dr. Almyra Free.  History of Present Illness:   Darrell [name] significant  history of CAD. Darrell [name] underwent CABG 2009 with LIMA-LAD, Left radial to marginal, SVG-PDA-PLA, SVG-diagonal. Darrell [name] [name] had subsequent PCI's since that time. Last LHC in 04/2018 showed severe in-stent restenosis of 99% in native Cx. Diffuse severe disease in the LAD beyond LIMA insertion treated with cutting balloon atherectomy. Darrell [name] is maintained on DAPT with ASA and plavix. Darrell [name] was last seen during a hospitalization July 2021 for chest pain and elevated troponin. Darrell [name] [name] a history of chest pain when Darrell [name] runs out of his medications. Darrell [name] [name] mildly elevated troponin in the setting of hypertensive urgency. No further ischemic evaluation. Darrell [name] [name] not followed up in OP clinic after the last two hospitalizations.   Darrell [name] presented to [facility] via EMS today 05/29/20 with chest pain and a BP of 218/123 trending up to 263/153. EMS administered nirto x 3 with some improvement in BP and improvement in chest pain from 8 --> 3.  HS troponin 34. sCr 1.68 - near baseline. Hb 11.6, near baseline.   Cardiology was consulted.   I was able to speak with his wife over the phone prior to my interview. She reports that Darrell [name] is very sedentary. Darrell [name] only walks from room to room in the 2nd story condo. Darrell [name] never leaves the house - if Darrell [name] does, Darrell [name] only gets around by wheelchair. She states that when Darrell [name] walks to the restroom, Darrell [name] gets chest pain which subsides after Darrell [name] sits down. This [name] been going on for several months. She states Darrell [name] [name] been out of medications for several months. Darrell [name] [name] only been taking 81 mg ASA.  She states that today, the chest pain did not subside prompting EMS dispatch.   Upon arrival, EMS noted bed bug infestation. BP was significantly elevated, improved with nitro. During my interview, Darrell [name] states Darrell [name] [name] only been out of medications for 1 week. Darrell [name] states that it is [explative] that Darrell [name] [name] to be seen by a doctor to have his medications refilled. Darrell [name] states Darrell [name] [name] not been to the cardiology office due to significant co-pay. His pressure [name]  improved to SBP 166 and Darrell [name] is chest pain free. Darrell [name] understands Darrell [name] needs to be on his cardiac regimen to prevent progression of disease.   Past Medical History:  Diagnosis Date  . CKD (chronic kidney disease), stage III   . Claudication ([place])   . Coronary artery disease    a. 08/2001 s/p DES to RCA/RI;  b. 07/2006 s/p DES to p/d LCX;  c. 11/2007 CABGx5 (LIMA->LAD, VG->D1, LRA->OM1, VG->PDA->LPL); c. 03/2015: Synergy DES to mid-Cx in the setting of NSTEMI; d. 11/2015 NSTEMI->Med Rx. e. NSTEMI 01/2018 -> med rx.  . Diabetes mellitus type 2 in nonobese ([place])    a. A1c 7.0 09/2012.  . Diastolic dysfunction    a. 02/2016 Echo: EF 50-55%, gr1DD, Ao sclerosis, dil Ao root (17m), sev dil LA, triv TR, PASP 261mg.  . Marland Kitchenyperlipidemia   . Hypertension   . Noncompliance   . Right carotid bruit    a. noted 01/2018, will need OP eval when patient complies with follow-up.    Past Surgical History:  Procedure Laterality Date  . CARDIAC CATHETERIZATION N/A 04/06/2015   Procedure: Left Heart Cath and Cors/Grafts Angiography;  Surgeon: JoLorretta HarpMD;  [facility];  Service: Cardiovascular;  Laterality: N/A;  . CARDIAC CATHETERIZATION N/A 04/06/2015   Procedure: Coronary Stent Intervention;  Surgeon: JoLorretta HarpMD;  2.5 mm x 12 mm long Synergy DES followed by  2.5 mm x 16 mm long Synergy DES    . CORONARY ARTERY BYPASS GRAFT     2009 LIMA to LAD, SVG to Diag, SVG to PDA and PL, left radial to OM  . CORONARY BALLOON ANGIOPLASTY N/A 05/04/2018   Procedure: CORONARY BALLOON ANGIOPLASTY;  Surgeon: BeLorretta HarpMD;  [facility];  Service: Cardiovascular;  Laterality: N/A;  Prox CFX  . LEFT HEART CATH N/A 10/24/2012   Procedure: LEFT HEART CATH;  Surgeon: ThTroy SineMD;  [facility];  Service: Cardiovascular;  Laterality: N/A;  . LEFT HEART CATH AND CORS/GRAFTS ANGIOGRAPHY N/A 05/04/2018   Procedure: LEFT HEART CATH AND CORS/GRAFTS ANGIOGRAPHY;  Surgeon:  BeLorretta HarpMD;  [facility];  Service: Cardiovascular;  Laterality: N/A;     Home Medications:  Prior to Admission medications   Medication Sig Start Date End Date Taking? Authorizing Provider  amLODipine (NORVASC) 10 MG tablet Take 1 tablet (10 mg total) by mouth daily. Please make PCP appointment. 11/02/19   NeCharlott RakesMD  atorvastatin (LIPITOR) 80 MG tablet Take 1 tablet (80 mg total) by mouth every evening. 11/02/19   NeCharlott RakesMD  Blood Glucose Monitoring Suppl (TRUE METRIX METER) w/Device KIT USE AS INSTRUCTED Patient taking differently: 1 each by Other route as directed. 07/05/16   NeCharlott RakesMD  carvedilol (COREG) 25 MG tablet Take 1 tablet (25 mg total) by mouth 2 (two) times daily with a meal. Please make PCP appointment. 11/02/19   NeCharlott RakesMD  clopidogrel (PLAVIX) 75 MG tablet  Take 1 tablet (75 mg total) by mouth daily. Please make PCP appointment. 11/02/19   Darrell Rakes, MD  glucose blood (TRUE METRIX BLOOD GLUCOSE TEST) test strip 1 each by Other route 3 (three) times daily. 04/27/19   Mayers, Cari S, PA-C  hydrALAZINE (APRESOLINE) 25 MG tablet Take 1 tablet (25 mg total) by mouth 2 (two) times daily. 11/02/19   Darrell Rakes, MD  isosorbide mononitrate (IMDUR) 120 MG 24 hr tablet Take 1 tablet (120 mg total) by mouth daily. Please make PCP appointment. 11/02/19   Darrell Rakes, MD  Lancet Devices [facility]) lancets Use as instructed daily. Patient taking differently: 1 each by Other route as directed. 07/05/16   Darrell Rakes, MD  metFORMIN (GLUCOPHAGE) 500 MG tablet TAKE 1 TABLET (500 MG TOTAL) BY MOUTH DAILY WITH BREAKFAST. Patient taking differently: Take 500 mg by mouth daily with breakfast. 11/02/19 11/01/20  Darrell Rakes, MD  nitroGLYCERIN (NITROSTAT) 0.4 MG SL tablet PLACE 1 TABLET UNDER THE TONGUE EVERY 5 MINUTES AS NEEDED FOR CHEST PAIN (UP TO 3 DOSES. IF TAKING 3RD DOSE CALL 911) Patient taking differently:  Place 0.4 mg under the tongue every 5 (five) minutes as needed for chest pain. 09/10/19 09/09/20  Ladell Pier, MD  tamsulosin (FLOMAX) 0.4 MG CAPS capsule Take 1 capsule (0.4 mg total) by mouth daily. Please make PCP appointment. 11/02/19   Darrell Rakes, MD  TRUEplus Lancets 28G MISC USE AS INSTRUCTED Patient taking differently: 1 each by Other route as directed. 04/27/19   Mayers, Loraine Grip, PA-C    Inpatient Medications: Scheduled Meds: . heparin  4,000 Units Intravenous Once   Continuous Infusions: . heparin     PRN Meds: nitroGLYCERIN  Allergies:   No Known Allergies  Social History:   Social History   Socioeconomic History  . Marital status: Married    Spouse name: Not on file  . Number of children: 3  . Years of education: Not on file  . Highest education level: Not on file  Occupational History  . Not on file  Tobacco Use  . Smoking status: Former Smoker    Quit date: 10/25/2010    Years since quitting: 9.6  . Smokeless tobacco: Never Used  Vaping Use  . Vaping Use: Never used  Substance and Sexual Activity  . Alcohol use: No    Comment: sts Darrell [name] drank for the first time in a long time last night   . Drug use: No  . Sexual activity: Not on file  Other Topics Concern  . Not on file  Social History Narrative   Lives at home with wife.     Social Determinants of Health   Financial Resource Strain: Not on file  Food Insecurity: Not on file  Transportation Needs: Not on file  Physical Activity: Not on file  Stress: Not on file  Social Connections: Not on file  Intimate Partner Violence: Not on file    Family History:    Family History  Problem Relation Age of Onset  . Heart failure Mother   . Cancer Mother   . CAD Mother 59  . Heart failure Father   . Kidney failure Brother      ROS:  Please see the history of present illness.   All other ROS reviewed and negative.     Physical Exam/Data:   Vitals:   05/29/20 1300 05/29/20 1315 05/29/20  1330 05/29/20 1345  BP: (!) 194/118 (!) 174/101 (!) 166/95 (!) 164/92  Pulse: 87  68 65 63  Resp: (!) 22 (!) 23 (!) 25 (!) 21  Temp:      TempSrc:      SpO2: 97% 96% 96% 99%  Weight:      Height:       No intake or output data in the 24 hours ending 05/29/20 1416 Last 3 Weights 05/29/2020 08/06/2019 08/05/2019  Weight (lbs) 234 lb 227 lb 1.2 oz 253 lb 8.5 oz  Weight (kg) 106.142 kg 103 kg 115 kg     Body mass index is 30.87 kg/m.  General:  Well nourished, well developed, in no acute distress Lungs:  Respirations unlabored Musculoskeletal:  Moves all extremities Skin: warm and dry  Neuro:  CNs 2-12 intact, no focal abnormalities noted Psych:  Normal affect   EKG:  The EKG was personally reviewed and demonstrates:  Sinus tachycardia HR 111, PAC, TWI inferior  Leads, ST depression V6 Telemetry:  Telemetry was personally reviewed and demonstrates:  Sinus to sinus tachycardia 60-100s  Relevant CV Studies:  Left heart cath 05/04/18:  Mid RCA lesion is 100% stenosed.  Ost RCA to Prox RCA lesion is 99% stenosed.  Ost LAD to Prox LAD lesion is 100% stenosed.  Mid Cx to Dist Cx lesion is 100% stenosed.  Origin to Prox Graft lesion is 100% stenosed.  Mid LAD to Dist LAD lesion is 90% stenosed with 100% stenosed side branch in Ost 3rd Sept.  Origin to Prox Graft lesion is 100% stenosed.  Ost Cx to Prox Cx lesion is 99% stenosed.  Scoring balloon angioplasty was performed using a BALLOON WOLVERINE 2.75X10.  Post intervention, there is a 30% residual stenosis.  The left ventricular ejection fraction is 50-55% by visual estimate.  LV end diastolic pressure is normal.  IMPRESSION: Darrell [name] had anatomy fairly similar to what it was in 2017.  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  The mid to distal AV groove circumflex stent was occluded at this point whereas 3 years ago it was subtotally occluded.  His distal LAD  beyond LIMA insertion was diffusely and severely diseased as well.  I performed Cutting Balloon atherectomy reducing a 99% lesion to less than 30%.  Patient tolerated procedure well.  Darrell [name] was already on aspirin Plavix.  Angiomax was turned off.  Darrell [name] left the lab in stable condition on IV nitroglycerin for blood pressure control.  The sheath will be removed and pressure held.  Patient will be gently hydrated and most likely discharged home tomorrow on aspirin and Plavix.   Echo 08/06/19: 1. Left ventricular ejection fraction, by estimation, is 65 to 70%. The  left ventricle [name] normal function. The left ventricle [name] no regional  wall motion abnormalities. There is severe left ventricular hypertrophy.  Indeterminate diastolic filling due  to E-A fusion.  2. Right ventricular systolic function is normal. The right ventricular  size is normal.  3. The mitral valve is abnormal. Trivial mitral valve regurgitation.  4. The aortic valve is abnormal. Aortic valve regurgitation is not  visualized. Mild aortic valve sclerosis is present, with no evidence of  aortic valve stenosis.   Laboratory Data:  High Sensitivity Troponin:   Recent Labs  Lab 05/29/20 1016 05/29/20 1240  TROPONINIHS 34* 251*     Chemistry Recent Labs  Lab 05/29/20 1016  NA 138  K 4.8  CL 106  CO2 22  GLUCOSE 148*  BUN 23  CREATININE 1.68*  CALCIUM 8.4*  GFRNONAA 45*  ANIONGAP 10    No results for input(s): PROT, ALBUMIN, AST, ALT, ALKPHOS, BILITOT in the last 168 hours. Hematology Recent Labs  Lab 05/29/20 1016  WBC 6.5  RBC 4.28  HGB 11.6*  HCT 38.1*  MCV 89.0  MCH 27.1  MCHC 30.4  RDW 14.7  PLT 212   BNPNo results for input(s): BNP, PROBNP in the last 168 hours.  DDimer No results for input(s): DDIMER in the last 168 hours.   Radiology/Studies:  DG Chest Port 1 View  Result Date: 05/29/2020 CLINICAL DATA:  Chest pain EXAM: PORTABLE CHEST 1 VIEW COMPARISON:  08/06/2019 FINDINGS: Cardiac shadow  is enlarged but stable. Postsurgical changes are again seen. The lungs are well aerated bilaterally. No focal infiltrate or effusion is noted. No bony abnormality is seen. IMPRESSION: No acute abnormality noted. Electronically Signed   By: Inez Catalina M.D.   On: 05/29/2020 11:26     Assessment and Plan:   Chest pain CAD s/p CABG and subsequent PCIs - hs troponin 34 - EKG with sinus tachycardia with HR 111 TWI inferior leads, ST depression V6, PAC - suspect chest pain is angina related to hypertensive urgency - Darrell [name] [name] been off of medications for at least a week, possibly several months - Darrell [name] is very sedentary and is in a wheelchair in public - Darrell [name] does not want to be seen for medication refills - suspect troponin elevation due to demand ischemia in the setting of HTN - I do not think Darrell [name] is a good candidate for repeat LHC at this time given his renal function and noncompliance - will treat medically - if delta troponin significantly elevated, will consider 48 hr heparin gtt - restart ASA and plavix, restart hydralazine and imdur, start coreg this evening   Sinus tachycardia - likely due to pain and hypertensive urgency - given 50 mg lopressor - HR much improved   Hypertensive urgency Hypertension - presenting pressure 218/123 - [name] been given 10 mg amlodipine - home regimen include amlodipine, coreg, hydralazine, and imdur - restart home medications   Chronic diastolic heart failure - no home diuretic - last echo HFpEF 07/2019 - will repeat an echo this admission   CKD stage III - sCr 1.68 - near baseline - K 4.8   Hyperlipidemia with LDL goal < 70 08/06/2019: Cholesterol 167; HDL 31; LDL Cholesterol 120; Triglycerides 81; VLDL 16 - on 80 mg lipitor - not at goal - I have discussed lipid clinic and [id number]   Noncompliance - Darrell [name] does not want to see a provider for refills, but also understands Darrell [name] [name] serious heart disease and needs to be compliant - I have asked him to  follow up with community health and wellness as directed and to continue home medications   Bedbug infestation - active infestation, per IM   Risk Assessment/Risk Scores:     HEAR Score (for undifferentiated chest pain):  HEAR Score: 5    For questions or updates, please contact [place] Please consult [email or web address] for contact info under    Signed, Ledora Bottcher, PA  05/29/2020 2:16 PM

## 2020-05-29 NOTE — ED Notes (Signed)
Ardis Hughs, pt's wife, called to check on pt's condition. Pt's wife states she uses a "call app" so she will call back and check in but does not have a phone that she can be contacted on. Pt's wife states it has been "quite some time" since he has taken his prescribed medications.

## 2020-05-29 NOTE — H&P (Signed)
Date: 05/29/2020               Patient Name:  Darrell [name] MRN: [id number]  DOB: 1955/11/01 Age / Sex: 65 y.o., male   PCP: Hoy Register, MD         Medical Service: Internal Medicine Teaching Service         Attending Physician: Dr. Gust Rung, DO    First Contact: Dr. Elaina Pattee Pager: [phone]  Second Contact: Dr. Sande Brothers Pager: [phone]       After Hours (After 5p/  First Contact Pager: [phone]  weekends / holidays): Second Contact Pager: [phone]   Chief Complaint: chest pain  History of Present Illness:  Darrell [name] is a 65 year old male with a past medical history of CAD s/p CABG and PCIs, hypertension, NIDM, hyperlipidemia, CKD stage IIIa , HFpEF, and medication noncompliance presents today to the ED for chest pain that started today. Notes he was watching TV today when he felt a sharp non radiating pain in the middle of his chest. Pain lasted about an hour and resolved with nitroglycerin. Now he is currently pain free and has had no pain since taking the nitroglycerin. Notes he has been off all his medications for about one week. States he did not have any refills remaining and was unable to go to see his PCP with [facility] due to lack of transportation.  Of note he had similar presentation in July 2021 with elevated blood pressures and chesp pain after running out of his medication. He denies fevers, chills, palpitations, diaphoresis, dyspnea, abdominal pain, nausea, vomiting, numbness, tingling, weakness, and syncope.   Meds: Current Outpatient Medications  Medication Instructions  . amLODipine (NORVASC) 10 mg, Oral, Daily, Please make PCP appointment.  Marland Kitchen atorvastatin (LIPITOR) 80 mg, Oral, Every evening  . Blood Glucose Monitoring Suppl (TRUE METRIX METER) w/Device KIT USE AS INSTRUCTED  . carvedilol (COREG) 25 mg, Oral, 2 times daily with meals, Please make PCP appointment.  . clopidogrel (PLAVIX) 75 mg, Oral, Daily, Please make PCP  appointment.  Marland Kitchen glucose blood (TRUE METRIX BLOOD GLUCOSE TEST) test strip 1 each, Other, 3 times daily  . hydrALAZINE (APRESOLINE) 25 mg, Oral, 2 times daily  . isosorbide mononitrate (IMDUR) 120 mg, Oral, Daily, Please make PCP appointment.  Demetra Shiner Devices (ACCU-CHEK SOFTCLIX) lancets Use as instructed daily.  . metFORMIN (GLUCOPHAGE) 500 MG tablet TAKE 1 TABLET (500 MG TOTAL) BY MOUTH DAILY WITH BREAKFAST.  Marland Kitchen nitroGLYCERIN (NITROSTAT) 0.4 MG SL tablet PLACE 1 TABLET UNDER THE TONGUE EVERY 5 MINUTES AS NEEDED FOR CHEST PAIN (UP TO 3 DOSES. IF TAKING 3RD DOSE CALL 911)  . tamsulosin (FLOMAX) 0.4 mg, Oral, Daily, Please make PCP appointment.  . TRUEplus Lancets 28G MISC USE AS INSTRUCTED    Allergies: Allergies as of 05/29/2020  . (No Known Allergies)   Past Medical History:  Diagnosis Date  . CKD (chronic kidney disease), stage III   . Claudication (HCC)   . Coronary artery disease    a. 08/2001 s/p DES to RCA/RI;  b. 07/2006 s/p DES to p/d LCX;  c. 11/2007 CABGx5 (LIMA->LAD, VG->D1, LRA->OM1, VG->PDA->LPL); c. 03/2015: Synergy DES to mid-Cx in the setting of NSTEMI; d. 11/2015 NSTEMI->Med Rx. e. NSTEMI 01/2018 -> med rx.  . Diabetes mellitus type 2 in nonobese (HCC)    a. A1c 7.0 09/2012.  . Diastolic dysfunction    a. 02/2016 Echo: EF 50-55%, gr1DD, Ao sclerosis, dil Ao root (  3mm), sev dil LA, triv TR, PASP 53mmHg.  Marland Kitchen Hyperlipidemia   . Hypertension   . Noncompliance   . Right carotid bruit    a. noted 01/2018, will need OP eval when patient complies with follow-up.    Family History: Notes father with history of heart disease and died of heart attack. Mother with heart failure, CAD and diabetes.   Social History: Notes previously smoked about 1 pack a day for 3 years until about 3 years ago. Denies alcohol use or any other drug use.   Review of Systems: A complete ROS was negative except as per HPI.   Physical Exam: Blood pressure (!) 170/108, pulse 70, temperature (!) 97.3 F  (36.3 C), temperature source Tympanic, resp. rate (!) 23, height $RemoveBe'6\' 1"'kQebxJBvw$  (1.854 m), weight 106.1 kg, SpO2 93 %. Physical Exam Constitutional:      General: He is not in acute distress.    Appearance: He is well-developed. He is obese.  HENT:     Head: Normocephalic and atraumatic.  Eyes:     Extraocular Movements: Extraocular movements intact.     Pupils: Pupils are equal, round, and reactive to light.  Cardiovascular:     Rate and Rhythm: Normal rate and regular rhythm.     Pulses:          Radial pulses are 2+ on the right side and 2+ on the left side.       Dorsalis pedis pulses are 2+ on the right side and 2+ on the left side.     Heart sounds: Normal heart sounds. No murmur heard. No friction rub. No gallop.   Pulmonary:     Effort: Pulmonary effort is normal. No tachypnea, accessory muscle usage or respiratory distress.     Breath sounds: Normal breath sounds. No wheezing or rhonchi.  Abdominal:     General: Bowel sounds are normal.     Palpations: Abdomen is soft. There is no mass.     Tenderness: There is no abdominal tenderness.  Musculoskeletal:     Cervical back: Normal range of motion.     Right lower leg: No edema.     Left lower leg: No edema.  Skin:    General: Skin is warm and dry.     Capillary Refill: Capillary refill takes less than 2 seconds.  Neurological:     General: No focal deficit present.     Mental Status: He is alert and oriented to person, place, and time.  Psychiatric:        Mood and Affect: Mood normal.        Behavior: Behavior normal.    EKG: personally reviewed my interpretation is HR 94, sinus rhythmn, no acute ischemic changes. PACs. T wave inversions in III and aVF chronic from prior in July 2021  CXR: personally reviewed my interpretation is Stable cardiomegaly, post surgical changes, no infiltrates of effusions.   Assessment & Plan by Problem: Active Problems:   Hypertensive urgency  Darrell [name] is a 65 year old male with a  past medical history of CAD s/p CABG and PCIs, hypertension, NIDM, hyperlipidemia, CKD stage IIIa , HFpEF, and medication noncompliance presents today to the ED for chest pain admitted for hypertensive urgency and further evaluation of chest pain.   Hypertensive emergency Presenting with BP as high as 263/163 and chest pain. He has not taken his medications for at least 1 week. Home medications include amlodipine 10 mg daily, carvedilol 25 mg twice daily, hydralazine 25  mg twice daily, and imdur 120 mg daily. Troponin 34>251. EKG without acute  ischemic changes. Started on amlodipine in ED with improvement to the 170s/100s.  - continue amlodipine 10 mg daily - carvedilol 25 mg twice daily, hydralazine 25 mg twice daily, and imdur 120 mg daily  - Monitor BMP - TSH - monitor on tele - monitor vitals   Chest pain CAD s/p CABG in 2009 and PCIs Presents for chest pain at rest that improved with nitroglycerin with elevated BP and tachycardia in the ED. Now chest pain free. History of CAD in 2009 with multiple subsequent PCIs. LHC in April 2020 with stent restenosis of 99% of the Cx and severe diffuse disease of the LAD. He is on DAPT with aspirin and Plavix at home. EKG with sinus tachycardia with HR 94, t wave inversions of II and aVF, PACs, no acute ischemic changes.Troponin 34>251. Suspect chest pain and elevated troponin in setting of demand due to uncontrolled hypertension as he has not been taking home medications. Treated with metoprolol 50 mg in ED with improvement.  - Cardiology consulted appreciate recommendations  - Started on heparin drip  - Restart home BP medications  - Restart atorvastatin 80 mg daily - Restart clopidogrel 75 mg daily and ASA 81 mg per cardiology reccomendations - Echocardiogram - Cardiac monitoring  Chronic diastolic heart failure Last Echo on 08/05/2020 with EF of 65%-70% with severe LVH. Euvolemic on exam.  - restart home medications  - Follow up  echocardiogram - monitor BMP - I/os, daily weights  CKD stage IIIa sCr 1.68 on admission. Baseline around 1.4-1.5. - Monitor renal function  Diabetes Mellitus On metformin 500 mg daily at home. A1c of 5.9 in July 2022. - SSI - CBG monitoring - A1c  Bedbug infestation Noted to have active bedbug infestation on cardiology note No obvious bedbugs seen on exam. Will keep on contact precautions.   Dispo: Admit patient to Observation with expected length of stay less than 2 midnights.  Signed: Iona Beard, MD 05/29/2020, 6:33 PM  Pager: [phone] After 5pm on weekdays and 1pm on weekends: On Call pager: [phone]

## 2020-05-29 NOTE — ED Notes (Signed)
Attempted to update wife, unable to leave voicemail

## 2020-05-29 NOTE — ED Triage Notes (Signed)
Pt arrived via [facility] from home. Pt c/o CP with acute onset at 6am today. EMS reports initial manual BP 260/198 and after admin of nitro sl 0.4 x3 BP decreased to 222/140 and CP decreased from 8 to 3. Pt took 324 mg aspirin at home. Pt initially c/o nausea with the CP but denied SOB, headache, with no obvious neuro deficit. Hx HTN and CABG with unknown amount of time of noncompliance with meds. Pt arrived to ED caox4 denying CP at present and still hypertensive.

## 2020-05-30 ENCOUNTER — Observation Stay (HOSPITAL_COMMUNITY): Payer: Medicare Other

## 2020-05-30 DIAGNOSIS — I161 Hypertensive emergency: Secondary | ICD-10-CM | POA: Diagnosis not present

## 2020-05-30 DIAGNOSIS — Z9114 Patient's other noncompliance with medication regimen: Secondary | ICD-10-CM | POA: Diagnosis not present

## 2020-05-30 DIAGNOSIS — R079 Chest pain, unspecified: Secondary | ICD-10-CM | POA: Diagnosis not present

## 2020-05-30 DIAGNOSIS — Z7982 Long term (current) use of aspirin: Secondary | ICD-10-CM | POA: Diagnosis not present

## 2020-05-30 DIAGNOSIS — Z833 Family history of diabetes mellitus: Secondary | ICD-10-CM | POA: Diagnosis not present

## 2020-05-30 DIAGNOSIS — E119 Type 2 diabetes mellitus without complications: Secondary | ICD-10-CM

## 2020-05-30 DIAGNOSIS — Z20822 Contact with and (suspected) exposure to covid-19: Secondary | ICD-10-CM | POA: Diagnosis not present

## 2020-05-30 DIAGNOSIS — D649 Anemia, unspecified: Secondary | ICD-10-CM | POA: Diagnosis not present

## 2020-05-30 DIAGNOSIS — I130 Hypertensive heart and chronic kidney disease with heart failure and stage 1 through stage 4 chronic kidney disease, or unspecified chronic kidney disease: Secondary | ICD-10-CM | POA: Diagnosis present

## 2020-05-30 DIAGNOSIS — I77810 Thoracic aortic ectasia: Secondary | ICD-10-CM | POA: Diagnosis not present

## 2020-05-30 DIAGNOSIS — I25118 Atherosclerotic heart disease of native coronary artery with other forms of angina pectoris: Secondary | ICD-10-CM | POA: Diagnosis not present

## 2020-05-30 DIAGNOSIS — I214 Non-ST elevation (NSTEMI) myocardial infarction: Secondary | ICD-10-CM | POA: Diagnosis present

## 2020-05-30 DIAGNOSIS — N1831 Chronic kidney disease, stage 3a: Secondary | ICD-10-CM

## 2020-05-30 DIAGNOSIS — I2510 Atherosclerotic heart disease of native coronary artery without angina pectoris: Secondary | ICD-10-CM | POA: Diagnosis not present

## 2020-05-30 DIAGNOSIS — Z79899 Other long term (current) drug therapy: Secondary | ICD-10-CM | POA: Diagnosis not present

## 2020-05-30 DIAGNOSIS — I10 Essential (primary) hypertension: Secondary | ICD-10-CM | POA: Diagnosis not present

## 2020-05-30 DIAGNOSIS — Z8249 Family history of ischemic heart disease and other diseases of the circulatory system: Secondary | ICD-10-CM | POA: Diagnosis not present

## 2020-05-30 DIAGNOSIS — I160 Hypertensive urgency: Secondary | ICD-10-CM | POA: Diagnosis not present

## 2020-05-30 DIAGNOSIS — Z841 Family history of disorders of kidney and ureter: Secondary | ICD-10-CM | POA: Diagnosis not present

## 2020-05-30 DIAGNOSIS — B889 Infestation, unspecified: Secondary | ICD-10-CM

## 2020-05-30 DIAGNOSIS — E1151 Type 2 diabetes mellitus with diabetic peripheral angiopathy without gangrene: Secondary | ICD-10-CM | POA: Diagnosis present

## 2020-05-30 DIAGNOSIS — E1122 Type 2 diabetes mellitus with diabetic chronic kidney disease: Secondary | ICD-10-CM | POA: Diagnosis present

## 2020-05-30 DIAGNOSIS — Z7902 Long term (current) use of antithrombotics/antiplatelets: Secondary | ICD-10-CM | POA: Diagnosis not present

## 2020-05-30 DIAGNOSIS — I252 Old myocardial infarction: Secondary | ICD-10-CM | POA: Diagnosis not present

## 2020-05-30 DIAGNOSIS — Z7984 Long term (current) use of oral hypoglycemic drugs: Secondary | ICD-10-CM | POA: Diagnosis not present

## 2020-05-30 DIAGNOSIS — E785 Hyperlipidemia, unspecified: Secondary | ICD-10-CM | POA: Diagnosis present

## 2020-05-30 DIAGNOSIS — Z87891 Personal history of nicotine dependence: Secondary | ICD-10-CM | POA: Diagnosis not present

## 2020-05-30 DIAGNOSIS — I5032 Chronic diastolic (congestive) heart failure: Secondary | ICD-10-CM | POA: Diagnosis not present

## 2020-05-30 DIAGNOSIS — Z951 Presence of aortocoronary bypass graft: Secondary | ICD-10-CM | POA: Diagnosis not present

## 2020-05-30 DIAGNOSIS — B888 Other specified infestations: Secondary | ICD-10-CM | POA: Diagnosis present

## 2020-05-30 LAB — COMPREHENSIVE METABOLIC PANEL
ALT: 11 U/L (ref 0–44)
AST: 14 U/L — ABNORMAL LOW (ref 15–41)
Albumin: 2.8 g/dL — ABNORMAL LOW (ref 3.5–5.0)
Alkaline Phosphatase: 67 U/L (ref 38–126)
Anion gap: 4 — ABNORMAL LOW (ref 5–15)
BUN: 27 mg/dL — ABNORMAL HIGH (ref 8–23)
CO2: 24 mmol/L (ref 22–32)
Calcium: 8 mg/dL — ABNORMAL LOW (ref 8.9–10.3)
Chloride: 107 mmol/L (ref 98–111)
Creatinine, Ser: 1.65 mg/dL — ABNORMAL HIGH (ref 0.61–1.24)
GFR, Estimated: 46 mL/min — ABNORMAL LOW (ref >60)
Glucose, Bld: 159 mg/dL — ABNORMAL HIGH (ref 70–99)
Potassium: 5 mmol/L (ref 3.5–5.1)
Sodium: 135 mmol/L (ref 135–145)
Total Bilirubin: 0.4 mg/dL (ref 0.3–1.2)
Total Protein: 5.7 g/dL — ABNORMAL LOW (ref 6.5–8.1)

## 2020-05-30 LAB — ECHOCARDIOGRAM COMPLETE
AR max vel: 5.72 cm2
AV Area VTI: 5.69 cm2
AV Area mean vel: 5.13 cm2
AV Mean grad: 3 mmHg
AV Peak grad: 5.1 mmHg
Ao pk vel: 1.13 m/s
Area-P 1/2: 3.77 cm2
Calc EF: 59.6 %
Height: 73 in
MV M vel: 5.9 m/s
MV Peak grad: 139.2 mmHg
MV VTI: 4.43 cm2
P 1/2 time: 733 msec
Radius: 0.5 cm
S' Lateral: 4.4 cm
Single Plane A2C EF: 54.8 %
Single Plane A4C EF: 63.9 %
Weight: 3636.71 oz

## 2020-05-30 LAB — HEPARIN LEVEL (UNFRACTIONATED)
Heparin Unfractionated: 0.26 IU/mL — ABNORMAL LOW (ref 0.30–0.70)
Heparin Unfractionated: 0.46 IU/mL (ref 0.30–0.70)

## 2020-05-30 LAB — CBC
HCT: 33.6 % — ABNORMAL LOW (ref 39.0–52.0)
Hemoglobin: 10.4 g/dL — ABNORMAL LOW (ref 13.0–17.0)
MCH: 27.2 pg (ref 26.0–34.0)
MCHC: 31 g/dL (ref 30.0–36.0)
MCV: 87.7 fL (ref 80.0–100.0)
Platelets: 219 10*3/uL (ref 150–400)
RBC: 3.83 MIL/uL — ABNORMAL LOW (ref 4.22–5.81)
RDW: 14.8 % (ref 11.5–15.5)
WBC: 8.3 10*3/uL (ref 4.0–10.5)
nRBC: 0 % (ref 0.0–0.2)

## 2020-05-30 LAB — TROPONIN I (HIGH SENSITIVITY)
Troponin I (High Sensitivity): 483 ng/L (ref <18)
Troponin I (High Sensitivity): 488 ng/L (ref <18)

## 2020-05-30 LAB — GLUCOSE, CAPILLARY
Glucose-Capillary: 118 mg/dL — ABNORMAL HIGH (ref 70–99)
Glucose-Capillary: 160 mg/dL — ABNORMAL HIGH (ref 70–99)
Glucose-Capillary: 145 mg/dL — ABNORMAL HIGH (ref 70–99)
Glucose-Capillary: 87 mg/dL (ref 70–99)

## 2020-05-30 LAB — SARS CORONAVIRUS 2 (TAT 6-24 HRS): SARS Coronavirus 2: NEGATIVE

## 2020-05-30 MED ORDER — LOSARTAN POTASSIUM 50 MG PO TABS
50.0000 mg | ORAL_TABLET | Freq: Every day | ORAL | Status: DC
  Administered 2020-05-30 – 2020-05-31 (×2): 50 mg via ORAL
  Filled 2020-05-30 (×2): qty 1

## 2020-05-30 MED ORDER — HYDRALAZINE HCL 50 MG PO TABS
50.0000 mg | ORAL_TABLET | Freq: Two times a day (BID) | ORAL | Status: DC
  Administered 2020-05-30 – 2020-05-31 (×2): 50 mg via ORAL
  Filled 2020-05-30 (×2): qty 1

## 2020-05-30 MED ORDER — HYDROCHLOROTHIAZIDE 12.5 MG PO CAPS
12.5000 mg | ORAL_CAPSULE | Freq: Every day | ORAL | Status: DC
  Administered 2020-05-30 – 2020-05-31 (×2): 12.5 mg via ORAL
  Filled 2020-05-30 (×2): qty 1

## 2020-05-30 NOTE — Discharge Instructions (Signed)
To Darrell [name] It was a pleasure taking care of your during your stay at [facility]. During your stay you were found to have significantly elevated blood pressures. You were treated with medications, and placed back on your home medications with improvement in your chest pain. Please continue taking your home medications. Please also follow up with your heart doctor for a hospital follow up visit. If you begin to feel chest pain, painful headaches, light headedness, or dizziness, please visit the nearest emergency department for evaluation.

## 2020-05-30 NOTE — Progress Notes (Addendum)
The patient has been seen in conjunction with Laverda Page, NP. All aspects of care have been considered and discussed. The patient has been personally interviewed, examined, and all clinical data has been reviewed.   LVEF is preserved, troponin I delta was small, the patient is now asymptomatic back on medical therapy, and needs to have further up titration of antihypertensive therapy by adding low-dose diuretic in the form of HCTZ 12.5 mg/day.  Follow kidney function.  ECG today.    Progress Note  Patient Name: Darrell [name] Date of Encounter: 05/30/2020  [facility] Cardiologist: [name], MD   Subjective   No chest pain this morning. Blood pressures are better.   Inpatient Medications    Scheduled Meds: . amLODipine  10 mg Oral Daily  . atorvastatin  80 mg Oral QPM  . carvedilol  25 mg Oral BID WC  . clopidogrel  75 mg Oral Daily  . hydrALAZINE  25 mg Oral BID  . insulin aspart  0-9 Units Subcutaneous TID WC  . isosorbide mononitrate  120 mg Oral Daily  . tamsulosin  0.4 mg Oral Daily   Continuous Infusions: . heparin 1,700 Units/hr (05/30/20 0535)   PRN Meds: acetaminophen **OR** acetaminophen, labetalol, nitroGLYCERIN, senna-docusate   Vital Signs    Vitals:   05/29/20 2100 05/30/20 0020 05/30/20 0456 05/30/20 0802  BP: (!) 151/89 135/75 (!) 152/91 (!) 151/84  Pulse: 62 62 65 74  Resp: 19 20 20  (!) 21  Temp:  98.6 F (37 C) 98.6 F (37 C) 98.2 F (36.8 C)  TempSrc:  Oral Oral Oral  SpO2: 92% 98% 97% 99%  Weight:      Height:        Intake/Output Summary (Last 24 hours) at 05/30/2020 0938 Last data filed at 05/30/2020 0533 Gross per 24 hour  Intake --  Output 500 ml  Net -500 ml   Last 3 Weights 05/29/2020 05/29/2020 08/06/2019  Weight (lbs) 227 lb 4.7 oz 234 lb 227 lb 1.2 oz  Weight (kg) 103.1 kg 106.142 kg 103 kg      Telemetry    SR with PVCs - Personally Reviewed  ECG    No new tracing this morning  Physical Exam   GEN:  No acute distress.   Neck: No JVD Cardiac: RRR, no murmurs, rubs, or gallops.  Respiratory: Clear to auscultation bilaterally. GI: Soft, nontender, non-distended  MS: No edema; No deformity. Neuro:  Nonfocal  Psych: Normal affect   Labs    High Sensitivity Troponin:   Recent Labs  Lab 05/29/20 1016 05/29/20 1240  TROPONINIHS 34* 251*      Chemistry Recent Labs  Lab 05/29/20 1016 05/30/20 0156  NA 138 135  K 4.8 5.0  CL 106 107  CO2 22 24  GLUCOSE 148* 159*  BUN 23 27*  CREATININE 1.68* 1.65*  CALCIUM 8.4* 8.0*  PROT  --  5.7*  ALBUMIN  --  2.8*  AST  --  14*  ALT  --  11  ALKPHOS  --  67  BILITOT  --  0.4  GFRNONAA 45* 46*  ANIONGAP 10 4*     Hematology Recent Labs  Lab 05/29/20 1016 05/30/20 0156  WBC 6.5 8.3  RBC 4.28 3.83*  HGB 11.6* 10.4*  HCT 38.1* 33.6*  MCV 89.0 87.7  MCH 27.1 27.2  MCHC 30.4 31.0  RDW 14.7 14.8  PLT 212 219    BNPNo results for input(s): BNP, PROBNP in the last 168  hours.   DDimer No results for input(s): DDIMER in the last 168 hours.   Radiology    DG Chest Port 1 View  Result Date: 05/29/2020 CLINICAL DATA:  Chest pain EXAM: PORTABLE CHEST 1 VIEW COMPARISON:  08/06/2019 FINDINGS: Cardiac shadow is enlarged but stable. Postsurgical changes are again seen. The lungs are well aerated bilaterally. No focal infiltrate or effusion is noted. No bony abnormality is seen. IMPRESSION: No acute abnormality noted. Electronically Signed   By: Alcide Clever M.D.   On: 05/29/2020 11:26    Cardiac Studies   Echo: pending  Patient Profile     65 y.o. male with a hx of CAD s/p CABG and PCI's, HTN, HLD, DM, hx of bed bugs, CKD stage III, chronic diastolic heart failure, and noncompliance due to financial issues who is being seen today for the evaluation of chest pain at the request of Dr. Audley Hose.  Assessment & Plan    1. Chest pain/CAD s/p CABG and subsequent PCIs: hsTn 34>>251, EKG with sinus tachycardia with HR 111 TWI inferior  leads, ST depression V6, PAC. Chest pain felt to secondary to accelerated HTN with known CAD. No further chest pain since being resumed on medications.  -- will continue IV heparin for now, until echo results noted -- has been resumed on  ASA and plavix, hydralazine and imdur, and coreg  2. Sinus tachycardia: now resolved  -- continue coreg   3. Hypertensive urgency: presenting pressure 218/123 -- now much improved since being resumed on home medications -- amlodipine, coreg, hydralazine, and imdur, will further increase hydralazine to 50mg  BID  4. Chronic diastolic heart failure: last echo HFpEF 07/2019 -- echo pending read  5. CKD stage III: -- sCr 1.68 - near baseline  6. Hyperlipidemia with LDL goal < 70: 08/06/2019: Cholesterol 167; HDL 31; LDL Cholesterol 120; Triglycerides 81; VLDL 16 -- on 80 mg Lipitor, resumed this admission  7. Noncompliance: he does not want to see a provider for refills, but also understands he has serious heart disease and needs to be compliant -- depends on son to help with picking up medications, suspect he would benefit from outpatient mail order  8. Bedbug infestation: -- active infestation, per IM  For questions or updates, please contact [facility] Please consult [email or web address] for contact info under        Signed, 10/07/2019, NP  05/30/2020, 9:38 AM

## 2020-05-30 NOTE — Progress Notes (Signed)
HD#0 Subjective:  Overnight Events: Admitted   Darrell [name] was seen and evaluated at bedside this AM. He states that his chest pain was gone. He is feeling much better since starting his home medications.   Objective:  Vital signs in last 24 hours: Vitals:   05/29/20 2055 05/29/20 2100 05/30/20 0020 05/30/20 0456  BP:  (!) 151/89 135/75 (!) 152/91  Pulse:  62 62 65  Resp:  19 20 20   Temp: 98 F (36.7 C)  98.6 F (37 C) 98.6 F (37 C)  TempSrc: Oral  Oral Oral  SpO2:  92% 98% 97%  Weight: 103.1 kg     Height: 6\' 1"  (1.854 m)      Supplemental O2: Room Air SpO2: 97 %   Physical Exam:  Physical Exam Vitals reviewed.  Constitutional:      General: He is not in acute distress.    Appearance: He is ill-appearing. He is not toxic-appearing.  Cardiovascular:     Rate and Rhythm: Normal rate and regular rhythm.     Pulses: Normal pulses.     Heart sounds: Normal heart sounds. No murmur heard. No gallop.   Pulmonary:     Effort: Pulmonary effort is normal.     Breath sounds: Normal breath sounds. No wheezing, rhonchi or rales.  Abdominal:     General: Abdomen is flat. Bowel sounds are normal.     Tenderness: There is no abdominal tenderness.  Musculoskeletal:     Right lower leg: No edema.     Left lower leg: No edema.  Skin:    General: Skin is warm and dry.  Neurological:     Mental Status: He is alert and oriented to person, place, and time.     Filed Weights   05/29/20 1018 05/29/20 2055  Weight: 106.1 kg 103.1 kg     Intake/Output Summary (Last 24 hours) at 05/30/2020 0714 Last data filed at 05/30/2020 0533 Gross per 24 hour  Intake --  Output 500 ml  Net -500 ml   Net IO Since Admission: -500 mL [05/30/20 0714]  Pertinent Labs: CBC Latest Ref Rng & Units 05/30/2020 05/29/2020 08/08/2019  WBC 4.0 - 10.5 K/uL 8.3 6.5 6.4  Hemoglobin 13.0 - 17.0 g/dL 10.4(L) 11.6(L) 11.7(L)  Hematocrit 39.0 - 52.0 % 33.6(L) 38.1(L) 35.6(L)  Platelets 150 - 400 K/uL 219  212 180    CMP Latest Ref Rng & Units 05/30/2020 05/29/2020 08/08/2019  Glucose 70 - 99 mg/dL 07/29/2020) 10/09/2019) 938(H)  BUN 8 - 23 mg/dL 829(H) 23 371(I)  Creatinine 0.61 - 1.24 mg/dL 96(V) 89(F) 8.10(F)  Sodium 135 - 145 mmol/L 135 138 135  Potassium 3.5 - 5.1 mmol/L 5.0 4.8 4.5  Chloride 98 - 111 mmol/L 107 106 103  CO2 22 - 32 mmol/L 24 22 23   Calcium 8.9 - 10.3 mg/dL 8.0(L) 8.4(L) 8.9  Total Protein 6.5 - 8.1 g/dL 7.51(W) - -  Total Bilirubin 0.3 - 1.2 mg/dL 0.4 - -  Alkaline Phos 38 - 126 U/L 67 - -  AST 15 - 41 U/L 14(L) - -  ALT 0 - 44 U/L 11 - -   TSH 1.1  Imaging: DG Chest Port 1 View  Result Date: 05/29/2020 CLINICAL DATA:  Chest pain EXAM: PORTABLE CHEST 1 VIEW COMPARISON:  08/06/2019 FINDINGS: Cardiac shadow is enlarged but stable. Postsurgical changes are again seen. The lungs are well aerated bilaterally. No focal infiltrate or effusion is noted. No bony abnormality is seen. IMPRESSION:  No acute abnormality noted. Electronically Signed   By: Alcide Clever M.D.   On: 05/29/2020 11:26    Assessment/Plan:   Active Problems:   Hypertensive urgency  Patient Summary: Darrell [name] is a 65 year old male with a past medical history of CAD s/p CABG and PCIs, hypertension, NIDM, hyperlipidemia, CKD stage IIIa , HFpEF, and medication noncompliance presents today to the ED for chest pain admitted for hypertensive urgency and further evaluation of chest pain.   Hypertensive Emergency (Resolved) No chest pain this morning, blood pressures have stabilized with systolic in the 150s. Patient asymptomatic.  - Continue amlodipine 10 mg daily, carvedilol 25 mg twice daily, and imdur 120 mg daily  - Increase hydralazine to 50 mg twice daily - Added Cozaar 50 mg daily - Added HCTZ 12.5 mg daily (Consider Hyzaar on DC once properly titrated) - Monitor BMP - monitor on tele - monitor vitals   Chest pain CAD s/p CABG in 2009 and PCIs Chest pain today completely alleviated after starting  home medications, likely in the setting of his hypertensive urgency.  - Cardiology consulted appreciate recommendations   - repeat ECG and troponin - Continue heparin infusion - Continue home BP meds - Continue atorvastatin 80 mg daily - Continue clopidogrel 75 mg daily and ASA 81 mg - Cardiac monitoring  Chronic diastolic heart failure Last Echo on 08/05/2020 with EF of 65%-70% with severe LVH. Euvolemic on exam. Repeat echo shows LVEF of 55-60% with grade 1 diastolic dysfunction, moderate dilation of the aortic root,  And severely dilated L atrium.  - Continue home meds - monitor BMP - I/os, daily weights  CKD stage IIIa sCr 1.68 on admission. 1.65 today. Baseline around 1.4-1.5. - Monitor renal function  Diabetes Mellitus On metformin 500 mg daily at home. A1c of 5.4 on admission - SSI - CBG monitoring  Bedbug infestation Noted to have active bedbug infestation on cardiology note No obvious bedbugs seen on exam. Will keep on contact precautions.   Diet: HH, Carb modified IVF: None,None VTE: Heparin Code: Full   Dispo: Anticipated discharge to Home in 1 days pending further mangement.   Quincy Simmonds, MD 05/30/2020, 7:14 AM Pager: [phone]  Please contact the on call pager after 5 pm and on weekends at [phone].

## 2020-05-30 NOTE — Progress Notes (Signed)
ANTICOAGULATION CONSULT NOTE - Follow Up Consult  Pharmacy Consult for heparin Indication: chest pain/ACS  No Known Allergies  Patient Measurements: Height: 6\' 1"  (185.4 cm) Weight: 103.1 kg (227 lb 4.7 oz) IBW/kg (Calculated) : 79.9 Heparin Dosing Weight: 101.8 kg  Vital Signs: Temp: 98.5 F (36.9 C) (05/03 1234) Temp Source: Oral (05/03 1234) BP: 199/74 (05/03 1234) Pulse Rate: 74 (05/03 0802)  Labs: Recent Labs    05/29/20 1016 05/29/20 1240 05/29/20 1700 05/29/20 2244 05/30/20 0156 05/30/20 1014  HGB 11.6*  --   --   --  10.4*  --   HCT 38.1*  --   --   --  33.6*  --   PLT 212  --   --   --  219  --   LABPROT  --   --  14.4  --   --   --   INR  --   --  1.1  --   --   --   HEPARINUNFRC  --   --   --  0.31 0.26* 0.46  CREATININE 1.68*  --   --   --  1.65*  --   TROPONINIHS 34* 251*  --   --   --   --     Estimated Creatinine Clearance: 57.8 mL/min (A) (by C-G formula based on SCr of 1.65 mg/dL (H)).   Medical History: Past Medical History:  Diagnosis Date  . CKD (chronic kidney disease), stage III (HCC)   . Claudication (HCC)   . Coronary artery disease    a. 08/2001 s/p DES to RCA/RI;  b. 07/2006 s/p DES to p/d LCX;  c. 11/2007 CABGx5 (LIMA->LAD, VG->D1, LRA->OM1, VG->PDA->LPL); c. 03/2015: Synergy DES to mid-Cx in the setting of NSTEMI; d. 11/2015 NSTEMI->Med Rx. e. NSTEMI 01/2018 -> med rx.  . Diabetes mellitus type 2 in nonobese (HCC)    a. A1c 7.0 09/2012.  . Diastolic dysfunction    a. 02/2016 Echo: EF 50-55%, gr1DD, Ao sclerosis, dil Ao root (43mm), sev dil LA, triv TR, PASP 59m.  Hyperlipidemia   . Hypertension   . Noncompliance   . Right carotid bruit    a. noted 01/2018, will need OP eval when patient complies with follow-up.    Assessment: 56 yom with past history of MI s/p stents who has been off Plavix and BP medications due to running out of them. He presented last night  with chest pain that resolved with nitroglycerin and resuming  outpatient HTN meds.   Trop bump > will continue heparin for 48hr  Heparin level 0.48 at goal  on heparin drip rate 1700 uts/hr   Goal of Therapy:  Heparin level 0.3-0.7 units/ml Monitor platelets by anticoagulation protocol: Yes   Plan:  Heparin drip 1700 uts/hr Daily heparin level and CBC  Monitor s/sx of bleed   65 Pharm.D. CPP, BCPS Clinical Pharmacist [phone] 05/30/2020 1:13 PM    05/30/2020 1:10 PM

## 2020-05-31 ENCOUNTER — Other Ambulatory Visit (HOSPITAL_COMMUNITY): Payer: Self-pay

## 2020-05-31 ENCOUNTER — Other Ambulatory Visit: Payer: Self-pay

## 2020-05-31 DIAGNOSIS — E785 Hyperlipidemia, unspecified: Secondary | ICD-10-CM

## 2020-05-31 DIAGNOSIS — I161 Hypertensive emergency: Principal | ICD-10-CM

## 2020-05-31 DIAGNOSIS — Z9114 Patient's other noncompliance with medication regimen: Secondary | ICD-10-CM

## 2020-05-31 LAB — BASIC METABOLIC PANEL
Anion gap: 7 (ref 5–15)
BUN: 30 mg/dL — ABNORMAL HIGH (ref 8–23)
CO2: 21 mmol/L — ABNORMAL LOW (ref 22–32)
Calcium: 7.9 mg/dL — ABNORMAL LOW (ref 8.9–10.3)
Chloride: 104 mmol/L (ref 98–111)
Creatinine, Ser: 1.55 mg/dL — ABNORMAL HIGH (ref 0.61–1.24)
GFR, Estimated: 50 mL/min — ABNORMAL LOW (ref >60)
Glucose, Bld: 135 mg/dL — ABNORMAL HIGH (ref 70–99)
Potassium: 4.5 mmol/L (ref 3.5–5.1)
Sodium: 132 mmol/L — ABNORMAL LOW (ref 135–145)

## 2020-05-31 LAB — GLUCOSE, CAPILLARY
Glucose-Capillary: 114 mg/dL — ABNORMAL HIGH (ref 70–99)
Glucose-Capillary: 132 mg/dL — ABNORMAL HIGH (ref 70–99)

## 2020-05-31 LAB — CBC
HCT: 30 % — ABNORMAL LOW (ref 39.0–52.0)
Hemoglobin: 9.3 g/dL — ABNORMAL LOW (ref 13.0–17.0)
MCH: 27.3 pg (ref 26.0–34.0)
MCHC: 31 g/dL (ref 30.0–36.0)
MCV: 88 fL (ref 80.0–100.0)
Platelets: 200 10*3/uL (ref 150–400)
RBC: 3.41 MIL/uL — ABNORMAL LOW (ref 4.22–5.81)
RDW: 14.7 % (ref 11.5–15.5)
WBC: 7.3 10*3/uL (ref 4.0–10.5)
nRBC: 0 % (ref 0.0–0.2)

## 2020-05-31 MED ORDER — HYDRALAZINE HCL 50 MG PO TABS
50.0000 mg | ORAL_TABLET | Freq: Two times a day (BID) | ORAL | 0 refills | Status: DC
  Filled 2020-05-31: qty 50, 25d supply, fill #0

## 2020-05-31 MED ORDER — LOSARTAN POTASSIUM-HCTZ 50-12.5 MG PO TABS
1.0000 | ORAL_TABLET | Freq: Every day | ORAL | 0 refills | Status: DC
  Filled 2020-05-31: qty 30, 30d supply, fill #0

## 2020-05-31 NOTE — Discharge Summary (Signed)
Name: LAURIE LOVEJOY MRN: [id number] DOB: 06-17-1955 65 y.o. PCP: [name], MD  Date of Admission: 05/29/2020 10:04 AM Date of Discharge: 05/31/2020 Attending Physician: Joni Reining, DO  Discharge Diagnosis: 1. Hypertensive emergency 2. Uncontrolled hypertension 3. Chest pain 4. CAD with prior CABG and PCIs 5. CKD stage IIIa 6. Hyperlipidemia 7. Chronic diastolic heart failure 8. Medication noncompliance  Discharge Medications: Allergies as of 05/31/2020   No Known Allergies     Medication List    TAKE these medications   accu-chek softclix lancets Use as instructed daily. What changed:   how much to take  how to take this  when to take this  additional instructions   amLODipine 10 MG tablet Commonly known as: NORVASC Take 1 tablet (10 mg total) by mouth daily. Please make PCP appointment.   atorvastatin 80 MG tablet Commonly known as: LIPITOR Take 1 tablet (80 mg total) by mouth every evening.   carvedilol 25 MG tablet Commonly known as: COREG Take 1 tablet (25 mg total) by mouth 2 (two) times daily with a meal. Please make PCP appointment.   clopidogrel 75 MG tablet Commonly known as: PLAVIX Take 1 tablet (75 mg total) by mouth daily. Please make PCP appointment.   hydrALAZINE 50 MG tablet Commonly known as: APRESOLINE Take 1 tablet (50 mg total) by mouth 2 (two) times daily. What changed:   medication strength  how much to take   isosorbide mononitrate 120 MG 24 hr tablet Commonly known as: IMDUR Take 1 tablet (120 mg total) by mouth daily. Please make PCP appointment.   losartan-hydrochlorothiazide 50-12.5 MG tablet Commonly known as: Hyzaar Take 1 tablet by mouth daily.   metFORMIN 500 MG tablet Commonly known as: GLUCOPHAGE TAKE 1 TABLET (500 MG TOTAL) BY MOUTH DAILY WITH BREAKFAST. What changed: how much to take   nitroGLYCERIN 0.4 MG SL tablet Commonly known as: NITROSTAT PLACE 1 TABLET UNDER THE TONGUE EVERY 5 MINUTES AS  NEEDED FOR CHEST PAIN (UP TO 3 DOSES. IF TAKING 3RD DOSE CALL 911) What changed:   how much to take  how to take this  when to take this  reasons to take this   tamsulosin 0.4 MG Caps capsule Commonly known as: FLOMAX Take 1 capsule (0.4 mg total) by mouth daily. Please make PCP appointment.   True Metrix Blood Glucose Test test strip Generic drug: glucose blood 1 each by Other route 3 (three) times daily.   True Metrix Meter w/Device Kit USE AS INSTRUCTED What changed:   how much to take  how to take this  when to take this  additional instructions   TRUEplus Lancets 28G Misc USE AS INSTRUCTED What changed:   how much to take  how to take this  when to take this  additional instructions      Disposition and follow-up:   Mr.Casy ABIR CRAINE was discharged from [facility] in Stable condition. At the hospital follow up visit please address:  1. Uncontrolled hypertension: BP elevated to 260s/160s on admission. Restarted on home medication with increase in hydralazine to 50 mg twice daily, started on losartan-HCTZ 50-12.5 mg daily. Please follow up BP and function.   Chest pain/CAD:  Chest pain resolved without recurrence. Restarted on DAPT.   Anemia Please follow up CBC, hgb decreasing during admission. No signs of bleeding. May benefit from colon cancer screening and further workup.  Noncompliance: Patient with difficulty getting medication and going to office visits due to lack of  transportation. Patient will follow up with PCP at [facility] and have mail order pharmacy set up through [facility] / imaging needed at time of follow-up: CBC, BMP  3.  Pending labs/ test needing follow-up: None  Follow-up Appointments:  Follow-up Information    [name], MD Follow up.   Specialty: Family Medicine Contact information: [place] [address] [phone]        Minus Breeding, MD. Schedule an appointment as  soon as possible for a visit in 1 week(s).   Specialty: Cardiology Contact information: [address] [place] [address] [place] Course by problem list: Chayne Baumgart is a 65 year old male with a past medical history of CAD s/p CABG and PCIs, hypertension, NIDM, hyperlipidemia, CKD stage IIIa , HFpEF, and medication noncompliance presents today to the ED for chest pain admitted for hypertensive urgency and further evaluation of chest pain.   Hypertensive emergency Presenting with BP as high as 263/163 and chest pain. He has not taken his medications for at least 1 week. Home medications include amlodipine 10 mg daily, carvedilol 25 mg twice daily, hydralazine 25 mg twice daily, and imdur 120 mg daily. Troponin 34>251. EKG without acute  ischemic changes. Restarted on home medication with some improvement. Hydralazine increased to 50 mg daily and started on losartan and HCTZ.with improvement in BP to 150s. Elevation in BP in the setting of medication on adherence. Patient does not have a car and has difficultly making it to the pharmacy and clinic appointment. Will have mail order prescriptions set up through [facility] hospital to help prevent this issue.  Chest pain CAD s/p CABG in 2009 and PCIs Presents for chest pain at rest that improved with nitroglycerin with elevated BP and tachycardia in the ED. History of CAD in 2009 with multiple subsequent PCIs. LHC in April 2020 with stent restenosis of 99% of the Cx and severe diffuse disease of the LAD. He is on DAPT with aspirin and Plavix at home. EKGwith sinus tachycardia with HR 94, t wave inversions of II and aVF, PACs, no acute ischemic changes.Troponin 34>251 on admission. Suspect chest pain and elevated troponin in setting of demand due to uncontrolled hypertension as he has not been taking home medications. Was treated with 48 hours of heparin during admission. He  was restarted on DAPT. Echo preserved EF and no regional wall motion abnormalities without significant changes from prior study does show evidence of uncontrolled hypertension. On repeat EKG patient did have new T wave inversions with troponin of 483>488. No recurrence of chest pain. Has follow up scheduled with cardiology and will need to remain adherent to medications.   Chronic diastolic heart failure Remained Euvolemic during admission. Repeat echo shows LVEF of 55-60% with grade 1 diastolic dysfunction, moderate dilation of the aortic root, and severely dilated L atrium. Continued on home medications.  CKD stage IIIa sCr 1.68 on admission. Baseline around 1.4-1.5. Improved to to 1.55 on admission.   Diabetes Mellitus On metformin 500 mg daily at home. A1c of 5.4 on admission. Will continue home medications on discharge.  Normocytic Anemia Patient noted to have slow decrease in hemoglobin during admission tom 11.6 to 9.3 on day of discharge. No signs of bleeding. No prior colonoscopy per chart review. May benefit from further workup as an outpatient.  CBC    Component  Value Date/Time   WBC 7.3 05/31/2020 0149   RBC 3.41 (L) 05/31/2020 0149   HGB 9.3 (L) 05/31/2020 0149   HCT 30.0 (L) 05/31/2020 0149   PLT 200 05/31/2020 0149   MCV 88.0 05/31/2020 0149   MCH 27.3 05/31/2020 0149   MCHC 31.0 05/31/2020 0149   RDW 14.7 05/31/2020 0149   LYMPHSABS 0.9 05/29/2020 1016   MONOABS 0.5 05/29/2020 1016   EOSABS 0.1 05/29/2020 1016   BASOSABS 0.0 05/29/2020 1016    Discharge Exam:   BP 129/75 (BP Location: Left Arm)   Pulse 66   Temp 98.6 F (37 C) (Oral)   Resp 16   Ht $R'6\' 1"'xb$  (1.854 m)   Wt 103.1 kg   SpO2 97%   BMI 29.99 kg/m  Discharge exam:   Constitutional:      General: He is not in acute distress. Cardiovascular:     Rate and Rhythm: Normal rate and regular rhythm.     Pulses: Normal pulses.     Heart sounds: Normal heart sounds. No murmur heard. No gallop.    Pulmonary:     Effort: Pulmonary effort is normal.     Breath sounds: Normal breath sounds. No wheezing, rhonchi or rales.  Abdominal:     General: Abdomen is flat. Bowel sounds are normal.     Tenderness: There is no abdominal tenderness.  Musculoskeletal:     Right lower leg: No edema.     Left lower leg: No edema.  Skin:    General: Skin is warm and dry.   Pertinent Labs, Studies, and Procedures:   DG Chest Port 1 View  Result Date: 05/29/2020 CLINICAL DATA:  Chest pain EXAM: PORTABLE CHEST 1 VIEW COMPARISON:  08/06/2019 FINDINGS: Cardiac shadow is enlarged but stable. Postsurgical changes are again seen. The lungs are well aerated bilaterally. No focal infiltrate or effusion is noted. No bony abnormality is seen. IMPRESSION: No acute abnormality noted. Electronically Signed   By: Inez Catalina M.D.   On: 05/29/2020 11:26   ECHOCARDIOGRAM COMPLETE  Result Date: 05/30/2020    ECHOCARDIOGRAM REPORT   Patient Name:   LENG MONTESDEOCA Date of Exam: 05/30/2020 Medical Rec #:  [id number]         Height:       73.0 in Accession #:    [id number]        Weight:       227.3 lb Date of Birth:  06-Oct-1955         BSA:          2.272 m Patient Age:    84 years          BP:           152/91 mmHg Patient Gender: M                 HR:           75 bpm. Exam Location:  Inpatient Procedure: 2D Echo, Cardiac Doppler and Color Doppler Indications:    Chest pain  History:        Patient has prior history of Echocardiogram examinations, most                 recent 08/06/2019. CAD; Risk Factors:Hypertension, Dyslipidemia                 and Diabetes.  Sonographer:    Luisa Hart RDCS Referring Phys: [id number] Tami Lin [facility]  Sonographer Comments: 2009 CABGx1 stated by patient  05/04/18 cath 04/06/15 cath IMPRESSIONS  1. Left ventricular ejection fraction, by estimation, is 55 to 60%. The left ventricle has normal function. The left ventricle has no regional wall motion abnormalities. There is severe left ventricular  hypertrophy. Left ventricular diastolic parameters  are consistent with Grade I diastolic dysfunction (impaired relaxation).  2. Right ventricular systolic function is normal. The right ventricular size is normal. There is mildly elevated pulmonary artery systolic pressure. The estimated right ventricular systolic pressure is 03.0 mmHg.  3. Left atrial size was severely dilated.  4. The mitral valve is normal in structure. Trivial mitral valve regurgitation. No evidence of mitral stenosis.  5. The aortic valve is normal in structure. Aortic valve regurgitation is trivial. No aortic stenosis is present. Aortic regurgitation PHT measures 733 msec.  6. Aortic dilatation noted. There is moderate dilatation of the aortic root, measuring 47 mm. There is mild dilatation of the ascending aorta, measuring 42 mm.  7. The inferior vena cava is normal in size with greater than 50% respiratory variability, suggesting right atrial pressure of 3 mmHg. FINDINGS  Left Ventricle: Left ventricular ejection fraction, by estimation, is 55 to 60%. The left ventricle has normal function. The left ventricle has no regional wall motion abnormalities. The left ventricular internal cavity size was normal in size. There is  severe left ventricular hypertrophy. Left ventricular diastolic parameters are consistent with Grade I diastolic dysfunction (impaired relaxation).  LV Wall Scoring: The mid inferoseptal segment and basal inferoseptal segment are akinetic. Right Ventricle: The right ventricular size is normal. No increase in right ventricular wall thickness. Right ventricular systolic function is normal. There is mildly elevated pulmonary artery systolic pressure. The tricuspid regurgitant velocity is 2.99  m/s, and with an assumed right atrial pressure of 8 mmHg, the estimated right ventricular systolic pressure is 09.2 mmHg. Left Atrium: Left atrial size was severely dilated. Right Atrium: Right atrial size was normal in size.  Pericardium: There is no evidence of pericardial effusion. Mitral Valve: The mitral valve is normal in structure. Mild mitral annular calcification. Trivial mitral valve regurgitation. No evidence of mitral valve stenosis. MV peak gradient, 3.8 mmHg. The mean mitral valve gradient is 2.0 mmHg. Tricuspid Valve: The tricuspid valve is normal in structure. Tricuspid valve regurgitation is trivial. No evidence of tricuspid stenosis. Aortic Valve: The aortic valve is normal in structure. Aortic valve regurgitation is trivial. Aortic regurgitation PHT measures 733 msec. No aortic stenosis is present. Aortic valve mean gradient measures 3.0 mmHg. Aortic valve peak gradient measures 5.1  mmHg. Aortic valve area, by VTI measures 5.69 cm. Pulmonic Valve: The pulmonic valve was normal in structure. Pulmonic valve regurgitation is trivial. No evidence of pulmonic stenosis. Aorta: Aortic dilatation noted. There is moderate dilatation of the aortic root, measuring 47 mm. There is mild dilatation of the ascending aorta, measuring 42 mm. Venous: The inferior vena cava is normal in size with greater than 50% respiratory variability, suggesting right atrial pressure of 3 mmHg. IAS/Shunts: No atrial level shunt detected by color flow Doppler.  LEFT VENTRICLE PLAX 2D LVIDd:         5.30 cm      Diastology LVIDs:         4.40 cm      LV e' medial:    4.68 cm/s LV PW:         2.10 cm      LV E/e' medial:  22.4 LV IVS:        1.80 cm  LV e' lateral:   7.72 cm/s LVOT diam:     2.90 cm      LV E/e' lateral: 13.6 LV SV:         131 LV SV Index:   58 LVOT Area:     6.61 cm  LV Volumes (MOD) LV vol d, MOD A2C: 112.0 ml LV vol d, MOD A4C: 171.0 ml LV vol s, MOD A2C: 50.6 ml LV vol s, MOD A4C: 61.7 ml LV SV MOD A2C:     61.4 ml LV SV MOD A4C:     171.0 ml LV SV MOD BP:      80.9 ml RIGHT VENTRICLE RV S prime:     13.30 cm/s  PULMONARY VEINS TAPSE (M-mode): 2.0 cm      A Reversal Duration: 109.00 msec                             A Reversal  Velocity: 21.20 cm/s                             Diastolic Velocity:  88.41 cm/s                             S/D Velocity:        0.40                             Systolic Velocity:   66.06 cm/s LEFT ATRIUM              Index       RIGHT ATRIUM           Index LA diam:        4.80 cm  2.11 cm/m  RA Area:     20.00 cm LA Vol (A2C):   143.0 ml 62.94 ml/m RA Volume:   53.10 ml  23.37 ml/m LA Vol (A4C):   159.0 ml 69.99 ml/m LA Biplane Vol: 159.0 ml 69.99 ml/m  AORTIC VALVE                   PULMONIC VALVE AV Area (Vmax):    5.72 cm    PV Vmax:       0.93 m/s AV Area (Vmean):   5.13 cm    PV Vmean:      69.800 cm/s AV Area (VTI):     5.69 cm    PV VTI:        0.200 m AV Vmax:           113.00 cm/s PV Peak grad:  3.5 mmHg AV Vmean:          87.500 cm/s PV Mean grad:  2.0 mmHg AV VTI:            0.231 m AV Peak Grad:      5.1 mmHg AV Mean Grad:      3.0 mmHg LVOT Vmax:         97.90 cm/s LVOT Vmean:        68.000 cm/s LVOT VTI:          0.199 m LVOT/AV VTI ratio: 0.86 AI PHT:            733 msec  AORTA Ao Root diam: 4.70 cm Ao Asc diam:  4.20 cm MITRAL  VALVE                 TRICUSPID VALVE MV Area (PHT): 3.77 cm      TR Peak grad:   35.8 mmHg MV Area VTI:   4.43 cm      TR Vmax:        299.00 cm/s MV Peak grad:  3.8 mmHg MV Mean grad:  2.0 mmHg      SHUNTS MV Vmax:       0.97 m/s      Systemic VTI:  0.20 m MV Vmean:      65.1 cm/s     Systemic Diam: 2.90 cm MV Decel Time: 201 msec MR Peak grad:    139.2 mmHg MR Mean grad:    87.0 mmHg MR Vmax:         590.00 cm/s MR Vmean:        433.0 cm/s MR PISA:         1.57 cm MR PISA Eff ROA: 6 mm MR PISA Radius:  0.50 cm MV E velocity: 105.00 cm/s MV A velocity: 47.10 cm/s MV E/A ratio:  2.23 Candee Furbish MD Electronically signed by Candee Furbish MD Signature Date/Time: 05/30/2020/11:21:48 AM    Final     Discharge Instructions: Discharge Instructions    Diet - low sodium heart healthy   Complete by: As directed    Discharge instructions   Complete by: As directed     You were hospitalized for chest pain due to uncontrolled high blood pressures. It is very important that you take your medications everyday to avoid damage to your heart, kidneys, and prevent stroke. Please follow up with you primary care doctor regularly to monitor your blood pressure and so you can have refills. You can also discuss if a mail order pharmacy is right for you so that you can have medications delivered to your home rather than having to pick up your medications in person. Please also follow up with cardiology to monitor your heart. Thank you for allowing [place] to be part of your care.   Please continue your regular medications with the following changes:  Start losartaon-hydrochlorothiazide 50-12.5 mg daily Increase hydralizine to 50 mg two times daily   Increase activity slowly   Complete by: As directed       Signed: Iona Beard, MD 05/31/2020, 4:50 PM   Pager: [phone]

## 2020-05-31 NOTE — Progress Notes (Addendum)
The patient has been seen in conjunction with Laverda Page, NP. All aspects of care have been considered and discussed. The patient has been personally interviewed, examined, and all clinical data has been reviewed.  Severe native CAD with h/o bypass graft failure, poor compliance with medication regimen, DM II, hyperlipidemia, and CKD presenting with prolonged CHEST PAIN after stopping his medical regimen and developing accelerated hypertension. Elevated flat troponin I trend, normal LV function on Echo, and resolution of pain after resumption of medical therapy suggests this is demand ischemia in setting of accelerated hypertension. BP improved after medication resumed. Recommend ambulation and if no recurrent CP, no further w/u at this time.   Progress Note  Patient Name: Darrell [name] Date of Encounter: 05/31/2020  [facility] Cardiologist: [name], MD   Subjective   Doing well this morning. No complaints.   Inpatient Medications    Scheduled Meds:  amLODipine  10 mg Oral Daily   atorvastatin  80 mg Oral QPM   carvedilol  25 mg Oral BID WC   clopidogrel  75 mg Oral Daily   hydrALAZINE  50 mg Oral BID   hydrochlorothiazide  12.5 mg Oral Daily   insulin aspart  0-9 Units Subcutaneous TID WC   isosorbide mononitrate  120 mg Oral Daily   losartan  50 mg Oral Daily   tamsulosin  0.4 mg Oral Daily   Continuous Infusions:   PRN Meds: acetaminophen **OR** acetaminophen, labetalol, nitroGLYCERIN, senna-docusate   Vital Signs    Vitals:   05/30/20 1954 05/30/20 2356 05/31/20 0420 05/31/20 0856  BP: (!) 152/83 (!) 141/84 (!) 157/87 (!) 171/87  Pulse: 75 73 76 80  Resp: (!) 22 20 20 20   Temp: 98.6 F (37 C) 98.6 F (37 C) 98.3 F (36.8 C) 98.6 F (37 C)  TempSrc: Oral Oral Oral Oral  SpO2: 99% 97% 99% 99%  Weight:      Height:        Intake/Output Summary (Last 24 hours) at 05/31/2020 0902 Last data filed at 05/31/2020 0043 Gross per 24 hour  Intake  704.95 ml  Output 1000 ml  Net -295.05 ml   Last 3 Weights 05/29/2020 05/29/2020 08/06/2019  Weight (lbs) 227 lb 4.7 oz 234 lb 227 lb 1.2 oz  Weight (kg) 103.1 kg 106.142 kg 103 kg      Telemetry    SR with multifocal PVCs - Personally Reviewed  ECG    No new tracing this morning.  Physical Exam   GEN: No acute distress.   Neck: No JVD Cardiac: RRR, no murmurs, rubs, or gallops.  Respiratory: Clear to auscultation bilaterally. GI: Soft, nontender, non-distended  MS: No edema; No deformity. Neuro:  Nonfocal  Psych: Normal affect   Labs    High Sensitivity Troponin:   Recent Labs  Lab 05/29/20 1016 05/29/20 1240 05/30/20 1509 05/30/20 1651  TROPONINIHS 34* 251* 483* 488*      Chemistry Recent Labs  Lab 05/29/20 1016 05/30/20 0156 05/31/20 0149  NA 138 135 132*  K 4.8 5.0 4.5  CL 106 107 104  CO2 22 24 21*  GLUCOSE 148* 159* 135*  BUN 23 27* 30*  CREATININE 1.68* 1.65* 1.55*  CALCIUM 8.4* 8.0* 7.9*  PROT  --  5.7*  --   ALBUMIN  --  2.8*  --   AST  --  14*  --   ALT  --  11  --   ALKPHOS  --  67  --  BILITOT  --  0.4  --   GFRNONAA 45* 46* 50*  ANIONGAP 10 4* 7     Hematology Recent Labs  Lab 05/29/20 1016 05/30/20 0156 05/31/20 0149  WBC 6.5 8.3 7.3  RBC 4.28 3.83* 3.41*  HGB 11.6* 10.4* 9.3*  HCT 38.1* 33.6* 30.0*  MCV 89.0 87.7 88.0  MCH 27.1 27.2 27.3  MCHC 30.4 31.0 31.0  RDW 14.7 14.8 14.7  PLT 212 219 200    BNPNo results for input(s): BNP, PROBNP in the last 168 hours.   DDimer No results for input(s): DDIMER in the last 168 hours.   Radiology    DG Chest Port 1 View  Result Date: 05/29/2020 CLINICAL DATA:  Chest pain EXAM: PORTABLE CHEST 1 VIEW COMPARISON:  08/06/2019 FINDINGS: Cardiac shadow is enlarged but stable. Postsurgical changes are again seen. The lungs are well aerated bilaterally. No focal infiltrate or effusion is noted. No bony abnormality is seen. IMPRESSION: No acute abnormality noted. Electronically Signed   By:  Alcide Clever M.D.   On: 05/29/2020 11:26   ECHOCARDIOGRAM COMPLETE  Result Date: 05/30/2020    ECHOCARDIOGRAM REPORT   Patient Name:   Darrell [name] Date of Exam: 05/30/2020 Medical Rec #:  [id number]         Height:       73.0 in Accession #:    [id number]        Weight:       227.3 lb Date of Birth:  December 15, 1955         BSA:          2.272 m Patient Age:    64 years          BP:           152/91 mmHg Patient Gender: M                 HR:           75 bpm. Exam Location:  Inpatient Procedure: 2D Echo, Cardiac Doppler and Color Doppler Indications:    Chest pain  History:        Patient has prior history of Echocardiogram examinations, most                 recent 08/06/2019. CAD; Risk Factors:Hypertension, Dyslipidemia                 and Diabetes.  Sonographer:    Neomia Dear RDCS Referring Phys: [id number] Roe Rutherford [facility]  Sonographer Comments: 2009 CABGx1 stated by patient 05/04/18 cath 04/06/15 cath IMPRESSIONS  1. Left ventricular ejection fraction, by estimation, is 55 to 60%. The left ventricle has normal function. The left ventricle has no regional wall motion abnormalities. There is severe left ventricular hypertrophy. Left ventricular diastolic parameters  are consistent with Grade I diastolic dysfunction (impaired relaxation).  2. Right ventricular systolic function is normal. The right ventricular size is normal. There is mildly elevated pulmonary artery systolic pressure. The estimated right ventricular systolic pressure is 43.8 mmHg.  3. Left atrial size was severely dilated.  4. The mitral valve is normal in structure. Trivial mitral valve regurgitation. No evidence of mitral stenosis.  5. The aortic valve is normal in structure. Aortic valve regurgitation is trivial. No aortic stenosis is present. Aortic regurgitation PHT measures 733 msec.  6. Aortic dilatation noted. There is moderate dilatation of the aortic root, measuring 47 mm. There is mild dilatation of the ascending aorta, measuring 42 mm.  7.  The inferior vena cava is normal in size with greater than 50% respiratory variability, suggesting right atrial pressure of 3 mmHg. FINDINGS  Left Ventricle: Left ventricular ejection fraction, by estimation, is 55 to 60%. The left ventricle has normal function. The left ventricle has no regional wall motion abnormalities. The left ventricular internal cavity size was normal in size. There is  severe left ventricular hypertrophy. Left ventricular diastolic parameters are consistent with Grade I diastolic dysfunction (impaired relaxation).  LV Wall Scoring: The mid inferoseptal segment and basal inferoseptal segment are akinetic. Right Ventricle: The right ventricular size is normal. No increase in right ventricular wall thickness. Right ventricular systolic function is normal. There is mildly elevated pulmonary artery systolic pressure. The tricuspid regurgitant velocity is 2.99  m/s, and with an assumed right atrial pressure of 8 mmHg, the estimated right ventricular systolic pressure is 43.8 mmHg. Left Atrium: Left atrial size was severely dilated. Right Atrium: Right atrial size was normal in size. Pericardium: There is no evidence of pericardial effusion. Mitral Valve: The mitral valve is normal in structure. Mild mitral annular calcification. Trivial mitral valve regurgitation. No evidence of mitral valve stenosis. MV peak gradient, 3.8 mmHg. The mean mitral valve gradient is 2.0 mmHg. Tricuspid Valve: The tricuspid valve is normal in structure. Tricuspid valve regurgitation is trivial. No evidence of tricuspid stenosis. Aortic Valve: The aortic valve is normal in structure. Aortic valve regurgitation is trivial. Aortic regurgitation PHT measures 733 msec. No aortic stenosis is present. Aortic valve mean gradient measures 3.0 mmHg. Aortic valve peak gradient measures 5.1  mmHg. Aortic valve area, by VTI measures 5.69 cm. Pulmonic Valve: The pulmonic valve was normal in structure. Pulmonic valve regurgitation  is trivial. No evidence of pulmonic stenosis. Aorta: Aortic dilatation noted. There is moderate dilatation of the aortic root, measuring 47 mm. There is mild dilatation of the ascending aorta, measuring 42 mm. Venous: The inferior vena cava is normal in size with greater than 50% respiratory variability, suggesting right atrial pressure of 3 mmHg. IAS/Shunts: No atrial level shunt detected by color flow Doppler.  LEFT VENTRICLE PLAX 2D LVIDd:         5.30 cm      Diastology LVIDs:         4.40 cm      LV e' medial:    4.68 cm/s LV PW:         2.10 cm      LV E/e' medial:  22.4 LV IVS:        1.80 cm      LV e' lateral:   7.72 cm/s LVOT diam:     2.90 cm      LV E/e' lateral: 13.6 LV SV:         131 LV SV Index:   58 LVOT Area:     6.61 cm  LV Volumes (MOD) LV vol d, MOD A2C: 112.0 ml LV vol d, MOD A4C: 171.0 ml LV vol s, MOD A2C: 50.6 ml LV vol s, MOD A4C: 61.7 ml LV SV MOD A2C:     61.4 ml LV SV MOD A4C:     171.0 ml LV SV MOD BP:      80.9 ml RIGHT VENTRICLE RV S prime:     13.30 cm/s  PULMONARY VEINS TAPSE (M-mode): 2.0 cm      A Reversal Duration: 109.00 msec  A Reversal Velocity: 21.20 cm/s                             Diastolic Velocity:  57.10 cm/s                             S/D Velocity:        0.40                             Systolic Velocity:   23.30 cm/s LEFT ATRIUM              Index       RIGHT ATRIUM           Index LA diam:        4.80 cm  2.11 cm/m  RA Area:     20.00 cm LA Vol (A2C):   143.0 ml 62.94 ml/m RA Volume:   53.10 ml  23.37 ml/m LA Vol (A4C):   159.0 ml 69.99 ml/m LA Biplane Vol: 159.0 ml 69.99 ml/m  AORTIC VALVE                   PULMONIC VALVE AV Area (Vmax):    5.72 cm    PV Vmax:       0.93 m/s AV Area (Vmean):   5.13 cm    PV Vmean:      69.800 cm/s AV Area (VTI):     5.69 cm    PV VTI:        0.200 m AV Vmax:           113.00 cm/s PV Peak grad:  3.5 mmHg AV Vmean:          87.500 cm/s PV Mean grad:  2.0 mmHg AV VTI:            0.231 m AV Peak  Grad:      5.1 mmHg AV Mean Grad:      3.0 mmHg LVOT Vmax:         97.90 cm/s LVOT Vmean:        68.000 cm/s LVOT VTI:          0.199 m LVOT/AV VTI ratio: 0.86 AI PHT:            733 msec  AORTA Ao Root diam: 4.70 cm Ao Asc diam:  4.20 cm MITRAL VALVE                 TRICUSPID VALVE MV Area (PHT): 3.77 cm      TR Peak grad:   35.8 mmHg MV Area VTI:   4.43 cm      TR Vmax:        299.00 cm/s MV Peak grad:  3.8 mmHg MV Mean grad:  2.0 mmHg      SHUNTS MV Vmax:       0.97 m/s      Systemic VTI:  0.20 m MV Vmean:      65.1 cm/s     Systemic Diam: 2.90 cm MV Decel Time: 201 msec MR Peak grad:    139.2 mmHg MR Mean grad:    87.0 mmHg MR Vmax:         590.00 cm/s MR Vmean:        433.0 cm/s MR PISA:  1.57 cm MR PISA Eff ROA: 6 mm MR PISA Radius:  0.50 cm MV E velocity: 105.00 cm/s MV A velocity: 47.10 cm/s MV E/A ratio:  2.23 Donato Schultz MD Electronically signed by Donato Schultz MD Signature Date/Time: 05/30/2020/11:21:48 AM    Final     Cardiac Studies   Echo: 05/30/20  IMPRESSIONS     1. Left ventricular ejection fraction, by estimation, is 55 to 60%. The  left ventricle has normal function. The left ventricle has no regional  wall motion abnormalities. There is severe left ventricular hypertrophy.  Left ventricular diastolic parameters   are consistent with Grade I diastolic dysfunction (impaired relaxation).   2. Right ventricular systolic function is normal. The right ventricular  size is normal. There is mildly elevated pulmonary artery systolic  pressure. The estimated right ventricular systolic pressure is 43.8 mmHg.   3. Left atrial size was severely dilated.   4. The mitral valve is normal in structure. Trivial mitral valve  regurgitation. No evidence of mitral stenosis.   5. The aortic valve is normal in structure. Aortic valve regurgitation is  trivial. No aortic stenosis is present. Aortic regurgitation PHT measures  733 msec.   6. Aortic dilatation noted. There is moderate  dilatation of the aortic  root, measuring 47 mm. There is mild dilatation of the ascending aorta,  measuring 42 mm.   7. The inferior vena cava is normal in size with greater than 50%  respiratory variability, suggesting right atrial pressure of 3 mmHg.   Patient Profile     65 y.o. male with a hx of CAD s/p CABG and PCI's, HTN, HLD, DM, hx of bed bugs, CKD stage III, chronic diastolic heart failure, and noncompliance due to financial issues who was seen for the evaluation of chest pain at the request of Dr. Audley Hose.  Assessment & Plan    1. Chest pain/CAD s/p CABG and subsequent PCIs: hsTn 34>>251, EKG with sinus tachycardia with HR 111 TWI inferior leads, ST depression V6, PAC on admission. Chest pain felt to secondary to accelerated HTN with known CAD. No further chest pain since being resumed on medications. Repeat EKG yesterday with TWI in percordial leads with prolonged QT interval. Recheck of hsTn 483>>488 in the setting of known CAD. Echo with normal EF and no rWMA noted.  -- has been resumed on  ASA, plavix, hydralazine, imdur, coreg and HCTZ added yesterday    2. Sinus tachycardia: now resolved  -- continue coreg    3. Hypertensive urgency: presenting pressure 218/123 -- now much improved since being resumed on home medications -- amlodipine, coreg, hydralazine, imdur and HCTZ  4. Chronic diastolic heart failure:  HFpEF 07/2019 -- normal EF with no rWMA noted this admission   5. CKD stage III: -- sCr 1.55- near baseline   6. Hyperlipidemia with LDL goal < 70: 08/06/2019: Cholesterol 167; HDL 31; LDL Cholesterol 120; Triglycerides 81; VLDL 16 -- on 80 mg Lipitor -- recheck FLP/LFTs as outpatient, consider referral for PCSK9    7. Noncompliance: he does not want to see a provider for refills, but also understands he has serious heart disease and needs to be compliant -- depends on son to help with picking up medications, and transportation to office visits. He is interested in  mail order. PharmD assistance with possible set up.   8. Bedbug infestation: -- active infestation, per IM. No bugs noted by visual exam  Will arrange for outpatient follow up in the office.   For  questions or updates, please contact [facility] Please consult [email or web address] for contact info under        Signed, Laverda PageLindsay Roberts, NP  05/31/2020, 9:02 AM

## 2020-06-01 ENCOUNTER — Other Ambulatory Visit: Payer: Self-pay

## 2020-06-01 ENCOUNTER — Telehealth: Payer: Self-pay

## 2020-06-01 ENCOUNTER — Other Ambulatory Visit: Payer: Self-pay | Admitting: Family Medicine

## 2020-06-01 DIAGNOSIS — I10 Essential (primary) hypertension: Secondary | ICD-10-CM

## 2020-06-01 DIAGNOSIS — E1159 Type 2 diabetes mellitus with other circulatory complications: Secondary | ICD-10-CM

## 2020-06-01 DIAGNOSIS — I25710 Atherosclerosis of autologous vein coronary artery bypass graft(s) with unstable angina pectoris: Secondary | ICD-10-CM

## 2020-06-01 MED FILL — Nitroglycerin SL Tab 0.4 MG: SUBLINGUAL | 15 days supply | Qty: 25 | Fill #0 | Status: CN

## 2020-06-01 NOTE — Telephone Encounter (Addendum)
Transition Care Management Unsuccessful Follow-up Telephone Call  Date of discharge and from where:  05/31/2020, [facility]   Attempts:  1st Attempt  Reason for unsuccessful TCM follow-up call:  Unable to reach patient- call placed to # [phone] and his daughter answered and said I need to call her brother.    Call placed to son, Cristal Deer # [phone] twice and the phone rings and then goes to fast busy signal.  Call placed to # [phone].  The recording stated that the call cannot be completed at this time.   Call placed to # [phone], this was the number for [facility].  Spoke to [facility] who said that the patient is no longer living there.  Phone number removed from Epic.  Need to discuss scheduling a follow up appointment with PCP

## 2020-06-01 NOTE — Telephone Encounter (Signed)
Requested medication (s) are due for refill today: Yes  Requested medication (s) are on the active medication list: Yes  Last refill:    Future visit scheduled: No  Notes to clinic:  Send to pharmacy for refills for mail order per discharge summary      Requested Prescriptions  Pending Prescriptions Disp Refills   tamsulosin (FLOMAX) 0.4 MG CAPS capsule 30 capsule 0    Sig: Take 1 capsule (0.4 mg total) by mouth daily. Please make PCP appointment.      Urology: Alpha-Adrenergic Blocker Passed - 06/01/2020  3:03 PM      Passed - Last BP in normal range    BP Readings from Last 1 Encounters:  05/31/20 129/75          Passed - Valid encounter within last 12 months    Recent Outpatient Visits           8 months ago Hospital discharge follow-up   [place] Ladell Pier, MD   1 year ago Screening for malignant neoplasm of prostate   [place], Cari S, [place]   2 years ago ACS (acute coronary syndrome) [facility])   [place], Charlane Ferretti, MD   2 years ago Type 2 diabetes mellitus with other circulatory complication, without long-term current use of insulin ([place])   [place], Charlane Ferretti, MD   3 years ago Type 2 diabetes mellitus without complication, without long-term current use of insulin ([place])   [place], Charlane Ferretti, MD       Future Appointments             In 2 weeks Hochrein, Jeneen Rinks, MD [facility]               isosorbide mononitrate (IMDUR) 120 MG 24 hr tablet 30 tablet 0    Sig: Take 1 tablet (120 mg total) by mouth daily. Please make PCP appointment.      Cardiovascular:  Nitrates Passed - 06/01/2020  3:03 PM      Passed - Last BP in normal range    BP Readings from Last 1 Encounters:  05/31/20 129/75          Passed - Last Heart Rate in normal range    Pulse  Readings from Last 1 Encounters:  05/31/20 66          Passed - Valid encounter within last 12 months    Recent Outpatient Visits           8 months ago Hospital discharge follow-up   [place] Ladell Pier, MD   1 year ago Screening for malignant neoplasm of prostate   [place], Cari S, [place]   2 years ago ACS (acute coronary syndrome) [facility])   [place], Charlane Ferretti, MD   2 years ago Type 2 diabetes mellitus with other circulatory complication, without long-term current use of insulin ([place])   [place], Charlane Ferretti, MD   3 years ago Type 2 diabetes mellitus without complication, without long-term current use of insulin ([place])   [place], [place], MD       Future Appointments             In 2 weeks Minus Breeding, MD  [facility]               clopidogrel (PLAVIX) 75 MG tablet 30 tablet 0    Sig: Take 1 tablet (75 mg total) by mouth daily. Please make PCP appointment.      Hematology: Antiplatelets - clopidogrel Failed - 06/01/2020  3:03 PM      Failed - Evaluate AST, ALT within 2 months of therapy initiation.      Failed - AST in normal range and within 360 days    AST  Date Value Ref Range Status  05/30/2020 14 (L) 15 - 41 U/L Final          Failed - HCT in normal range and within 180 days    HCT  Date Value Ref Range Status  05/31/2020 30.0 (L) 39.0 - 52.0 % Final          Failed - HGB in normal range and within 180 days    Hemoglobin  Date Value Ref Range Status  05/31/2020 9.3 (L) 13.0 - 17.0 g/dL Final          Failed - Valid encounter within last 6 months    Recent Outpatient Visits           8 months ago Hospital discharge follow-up   [place] Ladell Pier, MD   1 year ago Screening for malignant  neoplasm of prostate   [place], Cari S, [place]   2 years ago ACS (acute coronary syndrome) [facility])   [place], Charlane Ferretti, MD   2 years ago Type 2 diabetes mellitus with other circulatory complication, without long-term current use of insulin ([place])   [place], Charlane Ferretti, MD   3 years ago Type 2 diabetes mellitus without complication, without long-term current use of insulin ([place])   [place], Charlane Ferretti, MD       Future Appointments             In 2 weeks Hochrein, Jeneen Rinks, MD [facility]             Passed - ALT in normal range and within 360 days    ALT  Date Value Ref Range Status  05/30/2020 11 0 - 44 U/L Final          Passed - PLT in normal range and within 180 days    Platelets  Date Value Ref Range Status  05/31/2020 200 150 - 400 K/uL Final            carvedilol (COREG) 25 MG tablet 60 tablet 0    Sig: Take 1 tablet (25 mg total) by mouth 2 (two) times daily with a meal. Please make PCP appointment.      Cardiovascular:  Beta Blockers Failed - 06/01/2020  3:03 PM      Failed - Valid encounter within last 6 months    Recent Outpatient Visits           8 months ago Hospital discharge follow-up   [place] Ladell Pier, MD   1 year ago Screening for malignant neoplasm of prostate   [place], Cari S, [place]   2 years ago ACS (acute coronary syndrome) [facility])   [facility] [name], MD   2 years ago  Type 2 diabetes mellitus with other circulatory complication, without long-term current use of insulin ([place])   [place], [place], MD   3 years ago Type 2 diabetes mellitus without complication, without long-term current use of insulin ([place])   [place], Charlane Ferretti, MD       Future Appointments             In 2 weeks Hochrein, Jeneen Rinks, MD [facility]             Passed - Last BP in normal range    BP Readings from Last 1 Encounters:  05/31/20 129/75          Passed - Last Heart Rate in normal range    Pulse Readings from Last 1 Encounters:  05/31/20 66            atorvastatin (LIPITOR) 80 MG tablet 30 tablet 0    Sig: Take 1 tablet (80 mg total) by mouth every evening.      Cardiovascular:  Antilipid - Statins Failed - 06/01/2020  3:03 PM      Failed - LDL in normal range and within 360 days    LDL Cholesterol  Date Value Ref Range Status  08/06/2019 120 (H) 0 - 99 mg/dL Final    Comment:           Total Cholesterol/HDL:CHD Risk Coronary Heart Disease Risk Table                     Men   Women  1/2 Average Risk   3.4   3.3  Average Risk       5.0   4.4  2 X Average Risk   9.6   7.1  3 X Average Risk  23.4   11.0        Use the calculated Patient Ratio above and the CHD Risk Table to determine the patient's CHD Risk.        ATP III CLASSIFICATION (LDL):  <100     mg/dL   Optimal  100-129  mg/dL   Near or Above                    Optimal  130-159  mg/dL   Borderline  160-189  mg/dL   High  >190     mg/dL   Very High Performed at [facility] [address], [place], [place] [address]           Failed - HDL in normal range and within 360 days    HDL  Date Value Ref Range Status  08/06/2019 31 (L) >40 mg/dL Final          Passed - Total Cholesterol in normal range and within 360 days    Cholesterol  Date Value Ref Range Status  08/06/2019 167 0 - 200 mg/dL Final          Passed - Triglycerides in normal range and within 360 days    Triglycerides  Date Value Ref Range Status  08/06/2019 81 <150 mg/dL Final          Passed - Patient is not pregnant      Passed - Valid encounter within last 12 months    Recent Outpatient Visits            8 months ago Hospital discharge follow-up   [facility] Ladell Pier, MD   1 year ago Screening for malignant neoplasm of prostate   [place], Cari S, [place]   2 years ago ACS (acute coronary syndrome) [facility])   [address] [place], Charlane Ferretti, MD   2 years ago Type 2 diabetes mellitus with other circulatory complication, without long-term current use of insulin ([place])   [place], [place], MD   3 years ago Type 2 diabetes mellitus without complication, without long-term current use of insulin ([place])   [place], Charlane Ferretti, MD       Future Appointments             In 2 weeks Hochrein, Jeneen Rinks, MD [facility]               amLODipine (NORVASC) 10 MG tablet 30 tablet 0    Sig: Take 1 tablet (10 mg total) by mouth daily. Please make PCP appointment.      Cardiovascular:  Calcium Channel Blockers Failed - 06/01/2020  3:03 PM      Failed - Valid encounter within last 6 months    Recent Outpatient Visits           8 months ago Hospital discharge follow-up   [place] Ladell Pier, MD   1 year ago Screening for malignant neoplasm of prostate   [place], Cari S, [place]   2 years ago ACS (acute coronary syndrome) [facility])   [place], Charlane Ferretti, MD   2 years ago Type 2 diabetes mellitus with other circulatory complication, without long-term current use of insulin ([place])   [place], [place], MD   3 years ago Type 2 diabetes mellitus without complication, without long-term current use of insulin ([place])   [place], Charlane Ferretti, MD       Future Appointments             In 2 weeks Hochrein, Jeneen Rinks, MD  [facility]             Passed - Last BP in normal range    BP Readings from Last 1 Encounters:  05/31/20 129/75            metFORMIN (GLUCOPHAGE) 500 MG tablet 30 tablet 0    Sig: TAKE 1 TABLET (500 MG TOTAL) BY MOUTH DAILY WITH BREAKFAST.      Endocrinology:  Diabetes - Biguanides Failed - 06/01/2020  3:03 PM      Failed - Cr in normal range and within 360 days    Creat  Date Value Ref Range Status  03/26/2016 1.18 0.70 - 1.33 mg/dL Final    Comment:      For patients > or = 64 years of age: The upper reference limit for Creatinine is approximately 13% higher for people identified as African-American.      Creatinine, Ser  Date Value Ref Range Status  05/31/2020 1.55 (H) 0.61 - 1.24 mg/dL Final          Failed - eGFR in normal range and within 360 days    GFR calc Af Amer  Date Value Ref Range Status  08/08/2019 44 (L) >60 mL/min Final   GFR, Estimated  Date Value Ref Range Status  05/31/2020 50 (L) >60 mL/min  Final    Comment:    (NOTE) Calculated using the CKD-EPI Creatinine Equation (2021)           Failed - Valid encounter within last 6 months    Recent Outpatient Visits           8 months ago Hospital discharge follow-up   [place] Ladell Pier, MD   1 year ago Screening for malignant neoplasm of prostate   [place], Cari S, [place]   2 years ago ACS (acute coronary syndrome) [facility])   [place], Charlane Ferretti, MD   2 years ago Type 2 diabetes mellitus with other circulatory complication, without long-term current use of insulin ([place])   [place], [place], MD   3 years ago Type 2 diabetes mellitus without complication, without long-term current use of insulin ([place])   [place], Charlane Ferretti, MD       Future Appointments             In 2 weeks  Hochrein, Jeneen Rinks, MD [facility]             Passed - HBA1C is between 0 and 7.9 and within 180 days    Hgb A1c MFr Bld  Date Value Ref Range Status  05/29/2020 5.4 4.8 - 5.6 % Final    Comment:    (NOTE) Pre diabetes:          5.7%-6.4%  Diabetes:              >6.4%  Glycemic control for   <7.0% adults with diabetes

## 2020-06-02 ENCOUNTER — Other Ambulatory Visit: Payer: Self-pay

## 2020-06-02 ENCOUNTER — Telehealth: Payer: Self-pay

## 2020-06-02 NOTE — Telephone Encounter (Signed)
Transition Care Management Unsuccessful Follow-up Telephone Call  Date of discharge and from where:  05/31/2020, [facility]   Attempts:  2nd Attempt  Reason for unsuccessful TCM follow-up call: call placed to [phone]/ daughter answered  said to call her brother. Phone nr. provided.   Call placed to son, Cristal Deer  [phone] and the phone rings and then goes to fast busy signal, Unable to leave VM .

## 2020-06-05 ENCOUNTER — Telehealth: Payer: Self-pay

## 2020-06-05 NOTE — Telephone Encounter (Signed)
Transition Care Management Unsuccessful Follow-up Telephone Call  Date of discharge and from where:  05/31/2020, [facility]   Attempts:  3rd Attempt  Reason for unsuccessful TCM follow-up call:  Left voice message on # [phone]. Call back requested to this CM. Call placed to # [phone], the phone just rings.   Letter sent to patient requesting he contact [facility] to schedule a hospital follow up appointment

## 2020-06-07 ENCOUNTER — Other Ambulatory Visit: Payer: Self-pay

## 2020-06-08 ENCOUNTER — Other Ambulatory Visit: Payer: Self-pay

## 2020-06-09 ENCOUNTER — Other Ambulatory Visit: Payer: Self-pay

## 2020-06-18 NOTE — Progress Notes (Deleted)
Cardiology Office Note   Date:  06/18/2020   ID:  ANDRIEL OMALLEY, DOB 1955-08-02, MRN [id number]  PCP:  [name], MD  Cardiologist:   Minus Breeding, MD Referring:  ***  No chief complaint on file.     History of Present Illness: Darrell [name] is a 65 y.o. male who presents for evaluation of CAD.  He has had multiple prior PCIs.   He has had stents to prox-mid RCA and RI 08/2001, DES to distal Cx & DES to prox Cx 07/2006, CABG 11/2007, and NSTEMI 03/2015 s/p PTCA/DES to native Cx for ISR. Last cardiac catheterization was in 03/2015. At that time, he had a patent radial graft to an obtuse marginal branch and a patent LIMA to the mid LAD.The distal LAD was diffusely disease. There were left-to-right collaterals.The native right coronary artery was also occluded as well as the graft to the PDA and PLA.He was noted to have high-grade in-stent restenosis in the proximal codominant circumflex, which was felt to be the culprit lesion at that time for his non-STEMI.The proximal circumflex was stented however the distal circumflex was treated medically.In Jan he had NSTEMI but he declined cardiac cath.   He was in the hospital earlier this month with accelerated HTN because he stopped all of his meds.   ***       ***  ***We have seen this patient multiple times in the hospital for chest pain, NSTEMIs and hypertensive urgency secondary to noncompliance. He has never followed up in clinic. He reports that he is mainly noncompliant with his medications due to inability to afford them. He has not been seen since 2018.  He returned to [facility] with chest pain. While watching TV on the afternoon of admission he developed sudden onset substernal chest discomfort, which felt like indigestion. The pain did not radiate.It was intense. No associated dyspnea, nausea, vomiting or diaphoresis. When EMS arrived, they checked his blood pressure and it was noted to be severely elevated in  the 767H systolic.He was given sublingual nitroglycerin with complete resolution of his chest pain He was brought into the ED for further evaluation. EKG showed sinus rhythm with left atrial enlargement and old anterior infarct with borderline inferior T wave abnormalities. Labs were notable for normal CBC, Cr 1.47 (previous rate 1.1-1.4), and negative POC troponin. While in the ER he felt fine and requested to go home but required admission for concern for angina.  1. CAD with NSTEMI - troponin ruled in with trend 0.91->1.54->2.00. He was placed on IV heparin and antihypertensive regimen was resumed/titrated. Cardiac cath was offered to the patient but he declined. Plavix was started for medical therapy. Cardiac rehab team is not available on Sunday for phase I cardiac rehab but can be revisited as OP - may not be able to afford this. 2D echo 02/06/18 showed EF 55-60%, hypokinesis of the entireinferior myocardium; consistent with ischemia or infarction in the distribution of the right coronaryartery, grade 2 DD, moderate LAE, mildly dilated RA - study was technically difficult.  2. CKD stage III - creatinine trend 1.47->1.36->1.28. [name] was resumed. He will be at risk for worsening nephropathy long term if he remains noncompliant with hypertension therapy. Have advised he come to our office in 1 week for BMET given [name] re-initiation/titration.  3. Hypertensive urgency - BP improved to 155/85. We will titrate losartan to 170m today. Close f/u encouraged.  4. Hyperlipidemia - atorvastatin resumed. If the patient is tolerating statin  at time of follow-up appointment, would consider rechecking liver function/lipid panel in 6-8 weeks.  5. Noncompliance - asked care manager to see prior to dc. Importance of compliance reinforced.  6. DM - A1c 5.2.  7. Right carotid bruit - can arrange follow-up duplex as outpatient when patient comes for OV.  8. Mild anemia - Hgb trend noted 13.3->12.7->11.6. No  bleeding reported. Will check CBC when he returns for BMET in 1 week. If he does not follow up this will be something that needs attention by primary care.  Past Medical History:  Diagnosis Date  . CKD (chronic kidney disease), stage III ([place])   . Claudication ([place])   . Coronary artery disease    a. 08/2001 s/p DES to RCA/RI;  b. 07/2006 s/p DES to p/d LCX;  c. 11/2007 CABGx5 (LIMA->LAD, VG->D1, LRA->OM1, VG->PDA->LPL); c. 03/2015: Synergy DES to mid-Cx in the setting of NSTEMI; d. 11/2015 NSTEMI->Med Rx. e. NSTEMI 01/2018 -> med rx.  . Diabetes mellitus type 2 in nonobese ([place])    a. A1c 7.0 09/2012.  . Diastolic dysfunction    a. 02/2016 Echo: EF 50-55%, gr1DD, Ao sclerosis, dil Ao root (69mm), sev dil LA, triv TR, PASP 30mmHg.  Marland Kitchen Hyperlipidemia   . Hypertension   . Noncompliance   . Right carotid bruit    a. noted 01/2018, will need OP eval when patient complies with follow-up.    Past Surgical History:  Procedure Laterality Date  . CARDIAC CATHETERIZATION N/A 04/06/2015   Procedure: Left Heart Cath and Cors/Grafts Angiography;  Surgeon: Lorretta Harp, MD;  Location: [place] [facility];  Service: Cardiovascular;  Laterality: N/A;  . CARDIAC CATHETERIZATION N/A 04/06/2015   Procedure: Coronary Stent Intervention;  Surgeon: Lorretta Harp, MD;  2.5 mm x 12 mm long Synergy DES followed by  2.5 mm x 16 mm long Synergy DES    . CORONARY ARTERY BYPASS GRAFT     2009 LIMA to LAD, SVG to Diag, SVG to PDA and PL, left radial to OM  . CORONARY BALLOON ANGIOPLASTY N/A 05/04/2018   Procedure: CORONARY BALLOON ANGIOPLASTY;  Surgeon: Lorretta Harp, MD;  Location: [place] [facility];  Service: Cardiovascular;  Laterality: N/A;  Prox CFX  . LEFT HEART CATH N/A 10/24/2012   Procedure: LEFT HEART CATH;  Surgeon: Troy Sine, MD;  Location: [facility] CATH LAB;  Service: Cardiovascular;  Laterality: N/A;  . LEFT HEART CATH AND CORS/GRAFTS ANGIOGRAPHY N/A 05/04/2018   Procedure: LEFT HEART CATH AND CORS/GRAFTS  ANGIOGRAPHY;  Surgeon: Lorretta Harp, MD;  Location: [place] [facility];  Service: Cardiovascular;  Laterality: N/A;     Current Outpatient Medications  Medication Sig Dispense Refill  . amLODipine (NORVASC) 10 MG tablet Take 1 tablet (10 mg total) by mouth daily. Please make PCP appointment. 30 tablet 0  . atorvastatin (LIPITOR) 80 MG tablet Take 1 tablet (80 mg total) by mouth every evening. 30 tablet 0  . Blood Glucose Monitoring Suppl (TRUE METRIX METER) w/Device KIT USE AS INSTRUCTED (Patient taking differently: 1 each by Other route as directed.) 1 kit 0  . carvedilol (COREG) 25 MG tablet Take 1 tablet (25 mg total) by mouth 2 (two) times daily with a meal. Please make PCP appointment. 60 tablet 0  . clopidogrel (PLAVIX) 75 MG tablet Take 1 tablet (75 mg total) by mouth daily. Please make PCP appointment. 30 tablet 0  . glucose blood (TRUE METRIX BLOOD GLUCOSE TEST) test strip 1 each by Other  route 3 (three) times daily. 100 each 12  . hydrALAZINE (APRESOLINE) 50 MG tablet Take 1 tablet (50 mg total) by mouth 2 (two) times daily. 50 tablet 0  . isosorbide mononitrate (IMDUR) 120 MG 24 hr tablet Take 1 tablet (120 mg total) by mouth daily. Please make PCP appointment. 30 tablet 0  . Lancet Devices (ACCU-CHEK SOFTCLIX) lancets Use as instructed daily. (Patient taking differently: 1 each by Other route as directed.) 1 each 5  . losartan-hydrochlorothiazide (HYZAAR) 50-12.5 MG tablet Take 1 tablet by mouth daily. 30 tablet 0  . metFORMIN (GLUCOPHAGE) 500 MG tablet TAKE 1 TABLET (500 MG TOTAL) BY MOUTH DAILY WITH BREAKFAST. (Patient taking differently: Take 500 mg by mouth daily with breakfast.) 30 tablet 0  . nitroGLYCERIN (NITROSTAT) 0.4 MG SL tablet PLACE 1 TABLET UNDER THE TONGUE EVERY 5 MINUTES AS NEEDED FOR CHEST PAIN (UP TO 3 DOSES. IF TAKING 3RD DOSE CALL 911) (Patient taking differently: Place 0.4 mg under the tongue every 5 (five) minutes as needed for chest pain.) 25 tablet 2  .  tamsulosin (FLOMAX) 0.4 MG CAPS capsule Take 1 capsule (0.4 mg total) by mouth daily. Please make PCP appointment. 30 capsule 0  . TRUEplus Lancets 28G MISC USE AS INSTRUCTED (Patient taking differently: 1 each by Other route as directed.) 100 each 5   No current facility-administered medications for this visit.    Allergies:   Patient has no known allergies.   ROS:  Please see the history of present illness.   Otherwise, review of systems are positive for {NONE DEFAULTED:18576::"none"}.   All other systems are reviewed and negative.    PHYSICAL EXAM: VS:  There were no vitals taken for this visit. , BMI There is no height or weight on file to calculate BMI. GENERAL:  Well appearing NECK:  No jugular venous distention, waveform within normal limits, carotid upstroke brisk and symmetric, no bruits, no thyromegaly LUNGS:  Clear to auscultation bilaterally CHEST:  Well healed sternotomy scar. HEART:  PMI not displaced or sustained,S1 and S2 within normal limits, no S3, no S4, no clicks, no rubs, *** murmurs ABD:  Flat, positive bowel sounds normal in frequency in pitch, no bruits, no rebound, no guarding, no midline pulsatile mass, no hepatomegaly, no splenomegaly EXT:  2 plus pulses throughout, no edema, no cyanosis no clubbing    ***ENERAL:  Well appearing HEENT:  Pupils equal round and reactive, fundi not visualized, oral mucosa unremarkable NECK:  No jugular venous distention, waveform within normal limits, carotid upstroke brisk and symmetric, no bruits, no thyromegaly LYMPHATICS:  No cervical, inguinal adenopathy LUNGS:  Clear to auscultation bilaterally BACK:  No CVA tenderness CHEST:  Unremarkable HEART:  PMI not displaced or sustained,S1 and S2 within normal limits, no S3, no S4, no clicks, no rubs, *** murmurs ABD:  Flat, positive bowel sounds normal in frequency in pitch, no bruits, no rebound, no guarding, no midline pulsatile mass, no hepatomegaly, no splenomegaly EXT:  2  plus pulses throughout, no edema, no cyanosis no clubbing SKIN:  No rashes no nodules NEURO:  Cranial nerves II through XII grossly intact, motor grossly intact throughout PSYCH:  Cognitively intact, oriented to person place and time    EKG:  EKG {ACTION; IS/IS POL:[id number]} ordered today. The ekg ordered today demonstrates ***   Recent Labs: 08/06/2019: B Natriuretic Peptide 223.5 08/08/2019: Magnesium 1.9 05/29/2020: TSH 1.151 05/30/2020: ALT 11 05/31/2020: BUN 30; Creatinine, Ser 1.55; Hemoglobin 9.3; Platelets 200; Potassium 4.5; Sodium 132  Lipid Panel    Component Value Date/Time   CHOL 167 08/06/2019 0952   TRIG 81 08/06/2019 0952   HDL 31 (L) 08/06/2019 0952   CHOLHDL 5.4 08/06/2019 0952   VLDL 16 08/06/2019 0952   LDLCALC 120 (H) 08/06/2019 0952      Wt Readings from Last 3 Encounters:  05/29/20 227 lb 4.7 oz (103.1 kg)  08/06/19 227 lb 1.2 oz (103 kg)  08/01/18 220 lb 14.4 oz (100.2 kg)      Other studies Reviewed: Additional studies/ records that were reviewed today include: ***. Review of the above records demonstrates:  Please see elsewhere in the note.  ***   ASSESSMENT AND PLAN:  CAD:  ***  CKD III:  ***  HTN:  ***  DYSLIPIDEMIA:  ***  DM:  ***  Current medicines are reviewed at length with the patient today.  The patient {ACTIONS; HAS/DOES NOT HAVE:19233} concerns regarding medicines.  The following changes have been made:  {PLAN; NO CHANGE:13088:s}  Labs/ tests ordered today include: *** No orders of the defined types were placed in this encounter.    Disposition:   FU with ***    Signed, Minus Breeding, MD  06/18/2020 8:17 PM    [facility]

## 2020-06-19 ENCOUNTER — Telehealth: Payer: Self-pay | Admitting: *Deleted

## 2020-06-19 ENCOUNTER — Ambulatory Visit: Payer: Medicare Other | Admitting: Cardiology

## 2020-06-19 DIAGNOSIS — E118 Type 2 diabetes mellitus with unspecified complications: Secondary | ICD-10-CM

## 2020-06-19 DIAGNOSIS — E785 Hyperlipidemia, unspecified: Secondary | ICD-10-CM

## 2020-06-19 DIAGNOSIS — I10 Essential (primary) hypertension: Secondary | ICD-10-CM

## 2020-06-19 DIAGNOSIS — I2510 Atherosclerotic heart disease of native coronary artery without angina pectoris: Secondary | ICD-10-CM

## 2020-06-19 DIAGNOSIS — N1831 Chronic kidney disease, stage 3a: Secondary | ICD-10-CM

## 2020-06-19 NOTE — Telephone Encounter (Signed)
Called left message to see if patient was going to attend appointment today.  Left message for patient to call reschedule

## 2020-06-28 ENCOUNTER — Other Ambulatory Visit: Payer: Self-pay

## 2020-09-03 ENCOUNTER — Inpatient Hospital Stay (HOSPITAL_COMMUNITY)
Admission: EM | Admit: 2020-09-03 | Discharge: 2020-09-06 | DRG: 281 | Disposition: A | Discharge status: Home / Self Care | Payer: Medicare Other | Attending: Internal Medicine | Admitting: Internal Medicine

## 2020-09-03 ENCOUNTER — Emergency Department (HOSPITAL_COMMUNITY): Payer: Medicare Other

## 2020-09-03 ENCOUNTER — Observation Stay (HOSPITAL_COMMUNITY): Payer: Medicare Other

## 2020-09-03 ENCOUNTER — Other Ambulatory Visit: Payer: Self-pay

## 2020-09-03 ENCOUNTER — Encounter (HOSPITAL_COMMUNITY): Payer: Self-pay | Admitting: Internal Medicine

## 2020-09-03 DIAGNOSIS — I214 Non-ST elevation (NSTEMI) myocardial infarction: Secondary | ICD-10-CM | POA: Diagnosis not present

## 2020-09-03 DIAGNOSIS — I252 Old myocardial infarction: Secondary | ICD-10-CM

## 2020-09-03 DIAGNOSIS — E7800 Pure hypercholesterolemia, unspecified: Secondary | ICD-10-CM | POA: Diagnosis not present

## 2020-09-03 DIAGNOSIS — E1122 Type 2 diabetes mellitus with diabetic chronic kidney disease: Secondary | ICD-10-CM | POA: Diagnosis present

## 2020-09-03 DIAGNOSIS — E1151 Type 2 diabetes mellitus with diabetic peripheral angiopathy without gangrene: Secondary | ICD-10-CM | POA: Diagnosis not present

## 2020-09-03 DIAGNOSIS — I25710 Atherosclerosis of autologous vein coronary artery bypass graft(s) with unstable angina pectoris: Secondary | ICD-10-CM | POA: Diagnosis not present

## 2020-09-03 DIAGNOSIS — Z79899 Other long term (current) drug therapy: Secondary | ICD-10-CM

## 2020-09-03 DIAGNOSIS — Z8249 Family history of ischemic heart disease and other diseases of the circulatory system: Secondary | ICD-10-CM

## 2020-09-03 DIAGNOSIS — I25110 Atherosclerotic heart disease of native coronary artery with unstable angina pectoris: Secondary | ICD-10-CM | POA: Diagnosis present

## 2020-09-03 DIAGNOSIS — Z20822 Contact with and (suspected) exposure to covid-19: Secondary | ICD-10-CM | POA: Diagnosis present

## 2020-09-03 DIAGNOSIS — R778 Other specified abnormalities of plasma proteins: Secondary | ICD-10-CM | POA: Diagnosis present

## 2020-09-03 DIAGNOSIS — Z809 Family history of malignant neoplasm, unspecified: Secondary | ICD-10-CM

## 2020-09-03 DIAGNOSIS — I130 Hypertensive heart and chronic kidney disease with heart failure and stage 1 through stage 4 chronic kidney disease, or unspecified chronic kidney disease: Secondary | ICD-10-CM | POA: Diagnosis present

## 2020-09-03 DIAGNOSIS — N179 Acute kidney failure, unspecified: Secondary | ICD-10-CM | POA: Diagnosis not present

## 2020-09-03 DIAGNOSIS — E785 Hyperlipidemia, unspecified: Secondary | ICD-10-CM | POA: Diagnosis not present

## 2020-09-03 DIAGNOSIS — R079 Chest pain, unspecified: Secondary | ICD-10-CM | POA: Diagnosis not present

## 2020-09-03 DIAGNOSIS — I25719 Atherosclerosis of autologous vein coronary artery bypass graft(s) with unspecified angina pectoris: Secondary | ICD-10-CM | POA: Diagnosis not present

## 2020-09-03 DIAGNOSIS — I161 Hypertensive emergency: Secondary | ICD-10-CM | POA: Diagnosis present

## 2020-09-03 DIAGNOSIS — Z87891 Personal history of nicotine dependence: Secondary | ICD-10-CM

## 2020-09-03 DIAGNOSIS — I10 Essential (primary) hypertension: Secondary | ICD-10-CM | POA: Diagnosis not present

## 2020-09-03 DIAGNOSIS — N1832 Chronic kidney disease, stage 3b: Secondary | ICD-10-CM | POA: Diagnosis present

## 2020-09-03 DIAGNOSIS — I200 Unstable angina: Secondary | ICD-10-CM

## 2020-09-03 DIAGNOSIS — E119 Type 2 diabetes mellitus without complications: Secondary | ICD-10-CM

## 2020-09-03 DIAGNOSIS — Z7902 Long term (current) use of antithrombotics/antiplatelets: Secondary | ICD-10-CM

## 2020-09-03 DIAGNOSIS — I5032 Chronic diastolic (congestive) heart failure: Secondary | ICD-10-CM | POA: Diagnosis present

## 2020-09-03 DIAGNOSIS — R0789 Other chest pain: Secondary | ICD-10-CM | POA: Diagnosis not present

## 2020-09-03 DIAGNOSIS — D509 Iron deficiency anemia, unspecified: Secondary | ICD-10-CM | POA: Diagnosis not present

## 2020-09-03 DIAGNOSIS — E1159 Type 2 diabetes mellitus with other circulatory complications: Secondary | ICD-10-CM | POA: Diagnosis not present

## 2020-09-03 DIAGNOSIS — Z951 Presence of aortocoronary bypass graft: Secondary | ICD-10-CM

## 2020-09-03 DIAGNOSIS — Z9114 Patient's other noncompliance with medication regimen: Secondary | ICD-10-CM

## 2020-09-03 DIAGNOSIS — N189 Chronic kidney disease, unspecified: Secondary | ICD-10-CM | POA: Diagnosis not present

## 2020-09-03 DIAGNOSIS — Z7982 Long term (current) use of aspirin: Secondary | ICD-10-CM

## 2020-09-03 DIAGNOSIS — R52 Pain, unspecified: Secondary | ICD-10-CM

## 2020-09-03 DIAGNOSIS — Z9119 Patient's noncompliance with other medical treatment and regimen: Secondary | ICD-10-CM

## 2020-09-03 DIAGNOSIS — Z955 Presence of coronary angioplasty implant and graft: Secondary | ICD-10-CM

## 2020-09-03 DIAGNOSIS — Z7984 Long term (current) use of oral hypoglycemic drugs: Secondary | ICD-10-CM

## 2020-09-03 DIAGNOSIS — I517 Cardiomegaly: Secondary | ICD-10-CM | POA: Diagnosis not present

## 2020-09-03 DIAGNOSIS — I2510 Atherosclerotic heart disease of native coronary artery without angina pectoris: Secondary | ICD-10-CM | POA: Diagnosis present

## 2020-09-03 DIAGNOSIS — R000 Tachycardia, unspecified: Secondary | ICD-10-CM | POA: Diagnosis not present

## 2020-09-03 DIAGNOSIS — Z841 Family history of disorders of kidney and ureter: Secondary | ICD-10-CM

## 2020-09-03 LAB — ECHOCARDIOGRAM LIMITED
Area-P 1/2: 5.13 cm2
Height: 73 in
MV M vel: 6.11 m/s
MV Peak grad: 149.3 mmHg
S' Lateral: 3.85 cm
Weight: 3668.45 oz

## 2020-09-03 LAB — BASIC METABOLIC PANEL
Anion gap: 10 (ref 5–15)
BUN: 32 mg/dL — ABNORMAL HIGH (ref 8–23)
CO2: 21 mmol/L — ABNORMAL LOW (ref 22–32)
Calcium: 8.6 mg/dL — ABNORMAL LOW (ref 8.9–10.3)
Chloride: 106 mmol/L (ref 98–111)
Creatinine, Ser: 1.94 mg/dL — ABNORMAL HIGH (ref 0.61–1.24)
GFR, Estimated: 38 mL/min — ABNORMAL LOW (ref >60)
Glucose, Bld: 178 mg/dL — ABNORMAL HIGH (ref 70–99)
Potassium: 4.6 mmol/L (ref 3.5–5.1)
Sodium: 137 mmol/L (ref 135–145)

## 2020-09-03 LAB — CBC
HCT: 32.2 % — ABNORMAL LOW (ref 39.0–52.0)
Hemoglobin: 9.5 g/dL — ABNORMAL LOW (ref 13.0–17.0)
MCH: 23.9 pg — ABNORMAL LOW (ref 26.0–34.0)
MCHC: 29.5 g/dL — ABNORMAL LOW (ref 30.0–36.0)
MCV: 80.9 fL (ref 80.0–100.0)
Platelets: 241 10*3/uL (ref 150–400)
RBC: 3.98 MIL/uL — ABNORMAL LOW (ref 4.22–5.81)
RDW: 14.9 % (ref 11.5–15.5)
WBC: 8.2 10*3/uL (ref 4.0–10.5)
nRBC: 0 % (ref 0.0–0.2)

## 2020-09-03 LAB — BRAIN NATRIURETIC PEPTIDE: B Natriuretic Peptide: 1578.3 pg/mL — ABNORMAL HIGH (ref 0.0–100.0)

## 2020-09-03 LAB — TROPONIN I (HIGH SENSITIVITY)
Troponin I (High Sensitivity): 130 ng/L (ref <18)
Troponin I (High Sensitivity): 340 ng/L (ref <18)
Troponin I (High Sensitivity): 1371 ng/L (ref <18)
Troponin I (High Sensitivity): 1687 ng/L (ref <18)

## 2020-09-03 LAB — LIPID PANEL
Cholesterol: 167 mg/dL (ref 0–200)
HDL: 38 mg/dL — ABNORMAL LOW (ref >40)
LDL Cholesterol: 120 mg/dL — ABNORMAL HIGH (ref 0–99)
Total CHOL/HDL Ratio: 4.4 RATIO
Triglycerides: 43 mg/dL (ref <150)
VLDL: 9 mg/dL (ref 0–40)

## 2020-09-03 LAB — RAPID URINE DRUG SCREEN, HOSP PERFORMED
Amphetamines: NOT DETECTED
Barbiturates: NOT DETECTED
Benzodiazepines: NOT DETECTED
Cocaine: NOT DETECTED
Opiates: NOT DETECTED
Tetrahydrocannabinol: NOT DETECTED

## 2020-09-03 LAB — HEMOGLOBIN A1C
Hgb A1c MFr Bld: 5.6 % (ref 4.8–5.6)
Mean Plasma Glucose: 114.02 mg/dL

## 2020-09-03 LAB — SARS CORONAVIRUS 2 (TAT 6-24 HRS): SARS Coronavirus 2: NEGATIVE

## 2020-09-03 MED ORDER — SODIUM CHLORIDE 0.9 % WEIGHT BASED INFUSION
1.0000 mL/kg/h | INTRAVENOUS | Status: DC
Start: 2020-09-04 — End: 2020-09-03

## 2020-09-03 MED ORDER — NITROGLYCERIN 0.4 MG SL SUBL
0.4000 mg | SUBLINGUAL_TABLET | SUBLINGUAL | Status: DC | PRN
  Administered 2020-09-03 – 2020-09-04 (×5): 0.4 mg via SUBLINGUAL
  Filled 2020-09-03 (×2): qty 1

## 2020-09-03 MED ORDER — CLOPIDOGREL BISULFATE 75 MG PO TABS
75.0000 mg | ORAL_TABLET | Freq: Every day | ORAL | Status: DC
  Administered 2020-09-03 – 2020-09-06 (×4): 75 mg via ORAL
  Filled 2020-09-03 (×4): qty 1

## 2020-09-03 MED ORDER — SODIUM CHLORIDE 0.9% FLUSH: 3.0000 mL | INTRAVENOUS | Status: DC | PRN

## 2020-09-03 MED ORDER — ONDANSETRON HCL 4 MG/2ML IJ SOLN: 4.0000 mg | Freq: Four times a day (QID) | INTRAMUSCULAR | Status: DC | PRN

## 2020-09-03 MED ORDER — SODIUM CHLORIDE 0.9% FLUSH
3.0000 mL | Freq: Two times a day (BID) | INTRAVENOUS | Status: DC
  Administered 2020-09-03 – 2020-09-06 (×6): 3 mL via INTRAVENOUS

## 2020-09-03 MED ORDER — SODIUM CHLORIDE 0.9 % WEIGHT BASED INFUSION: 1.0000 mL/kg/h | INTRAVENOUS | Status: DC

## 2020-09-03 MED ORDER — AMLODIPINE BESYLATE 5 MG PO TABS
5.0000 mg | ORAL_TABLET | Freq: Every day | ORAL | Status: DC
  Administered 2020-09-03 – 2020-09-04 (×2): 5 mg via ORAL
  Filled 2020-09-03 (×3): qty 1

## 2020-09-03 MED ORDER — LABETALOL HCL 5 MG/ML IV SOLN
20.0000 mg | Freq: Once | INTRAVENOUS | Status: AC
  Administered 2020-09-03: 20 mg via INTRAVENOUS
  Filled 2020-09-03: qty 4

## 2020-09-03 MED ORDER — HYDRALAZINE HCL 50 MG PO TABS
50.0000 mg | ORAL_TABLET | Freq: Two times a day (BID) | ORAL | Status: DC
  Administered 2020-09-03 – 2020-09-06 (×7): 50 mg via ORAL
  Filled 2020-09-03 (×7): qty 1

## 2020-09-03 MED ORDER — SODIUM CHLORIDE 0.9 % IV SOLN: 250.0000 mL | INTRAVENOUS | Status: DC | PRN

## 2020-09-03 MED ORDER — CARVEDILOL 25 MG PO TABS
25.0000 mg | ORAL_TABLET | Freq: Two times a day (BID) | ORAL | Status: DC
  Administered 2020-09-03 – 2020-09-06 (×5): 25 mg via ORAL
  Filled 2020-09-03 (×6): qty 1

## 2020-09-03 MED ORDER — SODIUM CHLORIDE 0.9 % WEIGHT BASED INFUSION: 3.0000 mL/kg/h | INTRAVENOUS | Status: DC

## 2020-09-03 MED ORDER — ATORVASTATIN CALCIUM 80 MG PO TABS
80.0000 mg | ORAL_TABLET | Freq: Every day | ORAL | Status: DC
  Administered 2020-09-03 – 2020-09-06 (×4): 80 mg via ORAL
  Filled 2020-09-03 (×4): qty 1

## 2020-09-03 MED ORDER — ASPIRIN EC 81 MG PO TBEC
81.0000 mg | DELAYED_RELEASE_TABLET | Freq: Every day | ORAL | Status: DC
  Administered 2020-09-03 – 2020-09-06 (×3): 81 mg via ORAL
  Filled 2020-09-03 (×5): qty 1

## 2020-09-03 MED ORDER — ENOXAPARIN SODIUM 40 MG/0.4ML IJ SOSY
40.0000 mg | PREFILLED_SYRINGE | INTRAMUSCULAR | Status: DC
  Administered 2020-09-03 – 2020-09-05 (×3): 40 mg via SUBCUTANEOUS
  Filled 2020-09-03 (×3): qty 0.4

## 2020-09-03 MED ORDER — NITROGLYCERIN 2 % TD OINT
1.0000 [in_us] | TOPICAL_OINTMENT | Freq: Once | TRANSDERMAL | Status: AC
  Administered 2020-09-03: 1 [in_us] via TOPICAL
  Filled 2020-09-03: qty 1

## 2020-09-03 MED ORDER — ASPIRIN 81 MG PO CHEW
81.0000 mg | CHEWABLE_TABLET | ORAL | Status: AC
Start: 2020-09-04 — End: 2020-09-04
  Administered 2020-09-04: 81 mg via ORAL

## 2020-09-03 MED ORDER — ACETAMINOPHEN 325 MG PO TABS: 650.0000 mg | ORAL_TABLET | ORAL | Status: DC | PRN

## 2020-09-03 MED ORDER — HYDRALAZINE HCL 20 MG/ML IJ SOLN
10.0000 mg | INTRAMUSCULAR | Status: DC | PRN
  Administered 2020-09-04: 10 mg via INTRAVENOUS
  Filled 2020-09-03: qty 1

## 2020-09-03 NOTE — Progress Notes (Signed)
*  PRELIMINARY RESULTS* Echocardiogram Limited 2-D Echocardiogram  has been performed.  Stacey Drain 09/03/2020, 5:26 PM

## 2020-09-03 NOTE — ED Notes (Signed)
RN made Dr. Judd Lien aware of pt troponin 130

## 2020-09-03 NOTE — ED Notes (Signed)
Attempted to call report for pt. Charge RN has yet to approve patient. Will call back.

## 2020-09-03 NOTE — ED Triage Notes (Signed)
Pt endorses centralized chest pain that began on Saturday night. Says that he has a hx of  CABG and multiple Mis.

## 2020-09-03 NOTE — H&P (Addendum)
History and Physical    Darrell [name] HGZ:[id number] DOB: Apr 27, 1955 DOA: 09/03/2020  Referring MD/NP/PA: Lyda Perone, MD PCP: Hoy Register, MD  Patient coming from: Home  Chief Complaint: Chest pain  I have personally briefly reviewed patient's old medical records in [facility]   HPI: Darrell [name] is a 64 y.o. male with medical history significant of hypertension, hyperlipidemia, CAD s/p CABG and PCI, HFpEF, and CKD stage III who presents with complaints of chest pain which started yesterday night.  Pain was in the center of his chest and did not radiate.  Denied having any fall/trauma to onset symptoms, shortness of breath, diaphoresis, leg swelling, nausea, or vomiting.  Patient reports taking four 81 mg aspirin without change in symptoms.  Last hospitalized from 5/2-5/4 for hypertensive emergency with complaints of chest pain with troponins at that time elevated up to 488.  He reports that after discharge from the hospital only 2 blood pressure medications were sent to his pharmacy to pick up. Transitions of care had reached out several times but was unable to contact the patient.  He reports that his cell phone no longer works and he has no way of transportation.  His chest pain did resolve after he was given nitroglycerin.  Pharmacy tech noted hydralazine and lisinopril/hctz were filled 05/31/2020 for 30 days and other meds were last filled in October 2021.  ED Course: Upon admission into the emergency department patient was seen to heart rate 77-101, respirations elevated up to 29, blood pressure 171/99-249/137, and O2 saturations maintained on room air.  Labs significant for hemoglobin 9.5, BUN 32, creatinine 1.94, glucose 178, and high-sensitivity troponin 130.  Chest x-ray noted cardiomegaly but no acute abnormalities.  Patient has been given labetalol 20 mg IV, and nitroglycerin paste applied.  Review of Systems  Constitutional:  Negative for diaphoresis and fever.   HENT:  Negative for congestion and nosebleeds.   Eyes:  Negative for photophobia and pain.  Respiratory:  Negative for cough and shortness of breath.   Cardiovascular:  Positive for chest pain. Negative for leg swelling.  Gastrointestinal:  Negative for abdominal pain, nausea and vomiting.  Genitourinary:  Negative for dysuria and frequency.  Musculoskeletal:  Negative for back pain and falls.  Skin:  Negative for rash.  Neurological:  Negative for focal weakness and loss of consciousness.  Psychiatric/Behavioral:  Negative for memory loss and substance abuse.    Past Medical History:  Diagnosis Date   CKD (chronic kidney disease), stage III (HCC)    Claudication (HCC)    Coronary artery disease    a. 08/2001 s/p DES to RCA/RI;  b. 07/2006 s/p DES to p/d LCX;  c. 11/2007 CABGx5 (LIMA->LAD, VG->D1, LRA->OM1, VG->PDA->LPL); c. 03/2015: Synergy DES to mid-Cx in the setting of NSTEMI; d. 11/2015 NSTEMI->Med Rx. e. NSTEMI 01/2018 -> med rx.   Diabetes mellitus type 2 in nonobese [facility])    a. A1c 7.0 09/2012.   Diastolic dysfunction    a. 02/2016 Echo: EF 50-55%, gr1DD, Ao sclerosis, dil Ao root (7mm), sev dil LA, triv TR, PASP .   Hyperlipidemia    Hypertension    Noncompliance    Right carotid bruit    a. noted 01/2018, will need OP eval when patient complies with follow-up.    Past Surgical History:  Procedure Laterality Date   CARDIAC CATHETERIZATION N/A 04/06/2015   Procedure: Left Heart Cath and Cors/Grafts Angiography;  Surgeon: Runell Gess, MD;  Location: [facility] INVASIVE CV LAB;  Service: Cardiovascular;  Laterality: N/A;   CARDIAC CATHETERIZATION N/A 04/06/2015   Procedure: Coronary Stent Intervention;  Surgeon: Lorretta Harp, MD;  2.5 mm x 12 mm long Synergy DES followed by  2.5 mm x 16 mm long Synergy DES     CORONARY ARTERY BYPASS GRAFT     2009 LIMA to LAD, SVG to Diag, SVG to PDA and PL, left radial to OM   CORONARY BALLOON ANGIOPLASTY N/A 05/04/2018   Procedure: CORONARY  BALLOON ANGIOPLASTY;  Surgeon: Lorretta Harp, MD;  Location: [place] CV LAB;  Service: Cardiovascular;  Laterality: N/A;  Prox CFX   LEFT HEART CATH N/A 10/24/2012   Procedure: LEFT HEART CATH;  Surgeon: Troy Sine, MD;  Location: [facility] CATH LAB;  Service: Cardiovascular;  Laterality: N/A;   LEFT HEART CATH AND CORS/GRAFTS ANGIOGRAPHY N/A 05/04/2018   Procedure: LEFT HEART CATH AND CORS/GRAFTS ANGIOGRAPHY;  Surgeon: Lorretta Harp, MD;  Location: [place] CV LAB;  Service: Cardiovascular;  Laterality: N/A;     reports that he quit smoking about 9 years ago. He has never used smokeless tobacco. He reports that he does not drink alcohol and does not use drugs.  No Known Allergies  Family History  Problem Relation Age of Onset   Heart failure Mother    Cancer Mother    CAD Mother 46   Heart failure Father    Kidney failure Brother     Prior to Admission medications   Medication Sig Start Date End Date Taking? Authorizing Provider  amLODipine (NORVASC) 10 MG tablet Take 1 tablet (10 mg total) by mouth daily. Please make PCP appointment. 11/02/19   [name], MD  atorvastatin (LIPITOR) 80 MG tablet Take 1 tablet (80 mg total) by mouth every evening. 11/02/19   [name], MD  Blood Glucose Monitoring Suppl (TRUE METRIX METER) w/Device KIT USE AS INSTRUCTED Patient taking differently: 1 each by Other route as directed. 07/05/16   [name], MD  carvedilol (COREG) 25 MG tablet Take 1 tablet (25 mg total) by mouth 2 (two) times daily with a meal. Please make PCP appointment. 11/02/19   [name], MD  clopidogrel (PLAVIX) 75 MG tablet Take 1 tablet (75 mg total) by mouth daily. Please make PCP appointment. 11/02/19   [name], MD  glucose blood (TRUE METRIX BLOOD GLUCOSE TEST) test strip 1 each by Other route 3 (three) times daily. 04/27/19   Mayers, Cari S, PA-C  hydrALAZINE (APRESOLINE) 50 MG tablet Take 1 tablet (50 mg total) by mouth 2 (two) times daily.  05/31/20   Iona Beard, MD  isosorbide mononitrate (IMDUR) 120 MG 24 hr tablet Take 1 tablet (120 mg total) by mouth daily. Please make PCP appointment. 11/02/19   [name], MD  Lancet Devices [facility]) lancets Use as instructed daily. Patient taking differently: 1 each by Other route as directed. 07/05/16   [name], MD  losartan-hydrochlorothiazide (HYZAAR) 50-12.5 MG tablet Take 1 tablet by mouth daily. 05/31/20 07/01/20  Iona Beard, MD  metFORMIN (GLUCOPHAGE) 500 MG tablet TAKE 1 TABLET (500 MG TOTAL) BY MOUTH DAILY WITH BREAKFAST. Patient taking differently: Take 500 mg by mouth daily with breakfast. 11/02/19 11/01/20  [name], MD  nitroGLYCERIN (NITROSTAT) 0.4 MG SL tablet PLACE 1 TABLET UNDER THE TONGUE EVERY 5 MINUTES AS NEEDED FOR CHEST PAIN (UP TO 3 DOSES. IF TAKING 3RD DOSE CALL 911) Patient taking differently: Place 0.4 mg under the tongue every 5 (five) minutes as needed for  chest pain. 09/10/19 09/09/20  Ladell Pier, MD  tamsulosin (FLOMAX) 0.4 MG CAPS capsule Take 1 capsule (0.4 mg total) by mouth daily. Please make PCP appointment. 11/02/19   [name], MD  TRUEplus Lancets 28G MISC USE AS INSTRUCTED Patient taking differently: 1 each by Other route as directed. 04/27/19   Mayers, Loraine Grip, PA-C    Physical Exam:  Constitutional: Elderly male who appears to be in no acute distress at this time Vitals:   09/03/20 0645 09/03/20 0700 09/03/20 0715 09/03/20 0730  BP: (!) 171/99 (!) 185/109 (!) 175/114 (!) 188/109  Pulse: 77 79 77 79  Resp: (!) $RemoveB'26 20 18 15  'lCeaoaYi$ SpO2: 96% 96% 95% 95%   Eyes: PERRL, lids and conjunctivae normal ENMT: Mucous membranes are moist. Posterior pharynx clear of any exudate or lesions.  Neck: normal, supple, no masses, no thyromegaly Respiratory: clear to auscultation bilaterally, no wheezing, no crackles. Normal respiratory effort. No accessory muscle use.  Cardiovascular: Regular rate and rhythm, no murmurs / rubs /  gallops. No extremity edema. 2+ pedal pulses. No carotid bruits.  Abdomen: no tenderness, no masses palpated. No hepatosplenomegaly. Bowel sounds positive.  Musculoskeletal: no clubbing / cyanosis. No joint deformity upper and lower extremities. Good ROM, no contractures. Normal muscle tone.  Skin: no rashes, lesions, ulcers. No induration Neurologic: CN 2-12 grossly intact. Sensation intact, DTR normal. Strength 5/5 in all 4.  Psychiatric: Normal judgment and insight. Alert and oriented x 3. Normal mood.     Labs on Admission: I have personally reviewed following labs and imaging studies  CBC: Recent Labs  Lab 09/03/20 0334  WBC 8.2  HGB 9.5*  HCT 32.2*  MCV 80.9  PLT 921   Basic Metabolic Panel: Recent Labs  Lab 09/03/20 0334  NA 137  K 4.6  CL 106  CO2 21*  GLUCOSE 178*  BUN 32*  CREATININE 1.94*  CALCIUM 8.6*   GFR: CrCl cannot be calculated (Unknown ideal weight.). Liver Function Tests: No results for input(s): AST, ALT, ALKPHOS, BILITOT, PROT, ALBUMIN in the last 168 hours. No results for input(s): LIPASE, AMYLASE in the last 168 hours. No results for input(s): AMMONIA in the last 168 hours. Coagulation Profile: No results for input(s): INR, PROTIME in the last 168 hours. Cardiac Enzymes: No results for input(s): CKTOTAL, CKMB, CKMBINDEX, TROPONINI in the last 168 hours. BNP (last 3 results) No results for input(s): PROBNP in the last 8760 hours. HbA1C: No results for input(s): HGBA1C in the last 72 hours. CBG: No results for input(s): GLUCAP in the last 168 hours. Lipid Profile: No results for input(s): CHOL, HDL, LDLCALC, TRIG, CHOLHDL, LDLDIRECT in the last 72 hours. Thyroid Function Tests: No results for input(s): TSH, T4TOTAL, FREET4, T3FREE, THYROIDAB in the last 72 hours. Anemia Panel: No results for input(s): VITAMINB12, FOLATE, FERRITIN, TIBC, IRON, RETICCTPCT in the last 72 hours. Urine analysis:    Component Value Date/Time   COLORURINE  BROWN (A) 08/05/2019 2340   APPEARANCEUR TURBID (A) 08/05/2019 2340   LABSPEC  08/05/2019 2340    TEST NOT REPORTED DUE TO COLOR INTERFERENCE OF URINE PIGMENT   PHURINE  08/05/2019 2340    TEST NOT REPORTED DUE TO COLOR INTERFERENCE OF URINE PIGMENT   GLUCOSEU (A) 08/05/2019 2340    TEST NOT REPORTED DUE TO COLOR INTERFERENCE OF URINE PIGMENT   HGBUR (A) 08/05/2019 2340    TEST NOT REPORTED DUE TO COLOR INTERFERENCE OF URINE PIGMENT   HGBUR large 10/22/2007 Ages (  A) 08/05/2019 2340    TEST NOT REPORTED DUE TO COLOR INTERFERENCE OF URINE PIGMENT   KETONESUR (A) 08/05/2019 2340    TEST NOT REPORTED DUE TO COLOR INTERFERENCE OF URINE PIGMENT   PROTEINUR (A) 08/05/2019 2340    TEST NOT REPORTED DUE TO COLOR INTERFERENCE OF URINE PIGMENT   UROBILINOGEN 1.0 10/25/2012 0240   NITRITE (A) 08/05/2019 2340    TEST NOT REPORTED DUE TO COLOR INTERFERENCE OF URINE PIGMENT   LEUKOCYTESUR (A) 08/05/2019 2340    TEST NOT REPORTED DUE TO COLOR INTERFERENCE OF URINE PIGMENT   Sepsis Labs: No results found for this or any previous visit (from the past 240 hour(s)).   Radiological Exams on Admission: DG Chest Port 1 View  Result Date: 09/03/2020 CLINICAL DATA:  Central chest pain beginning on Saturday EXAM: PORTABLE CHEST 1 VIEW COMPARISON:  05/29/2020 FINDINGS: Cardiomegaly. CABG. There is no edema, consolidation, effusion, or pneumothorax. Unremarkable sternal wires IMPRESSION: No evidence of acute disease. Cardiomegaly. Electronically Signed   By: Monte Fantasia M.D.   On: 09/03/2020 04:54    EKG: Independently reviewed.  Sinus tachycardia 108 bpm with QTC 491 minimal ST depressions noted in inferior leads  Assessment/Plan Chest pain elevated troponin CAD with prior CABG and PCI's: Acute.  Patient presents with complaints of centralized chest pain that did not radiate.  Denied having nausea, vomiting, or diaphoresis.  Pain symptoms reportedly resolved after nitroglycerin.   High-sensitivity troponins were elevated 130-> 340.  Last left heart cath in 04/2018 with restenosis of 99% of the Cx and severe diffuse disease of the LAD requiring PCI. -Admit to a cardiac telemetry bed -Continue to trend cardiac troponin -N.p.o. after midnight in case of need of further ischemic work-up -Continue aspirin -Restart Plavix and statin -Check echocardiogram -Message sent for cardiology to evaluate in a.m. given complexity of patient's history  Hypertensive emergency: Acute.  Patient presents with blood pressures elevated up to 249/137.  He reports that after being discharged from the hospital only two blood pressure medications(30-day supply hydralazine and lisinopril-hydrochlorothiazide) were sent to the pharmacy. -Restart amlodipine 10 mg daily and hydralazine 50 mg twice daily -Hydralazine IV as needed for systolic blood pressures greater than 180  Acute kidney injury superimposed on chronic kidney disease stage IIIa: Patient presents with creatinine elevated up to 1.94 with BUN 32.  His baseline creatinine noted to be around 1.4-1.5.  During previous hospitalization patient was noted to have similar acute kidney injury that resolved with improvement in blood pressure. -Check urinalysis -Continue to monitor kidney function -Avoid any nephrotoxic agents at this time  Heart failure with preserved EF: Chronic.  Patient has no significant lower extremity swelling and does not appear to be fluid overloaded on physical exam.  Chest x-ray noted cardiomegaly, but no other acute abnormalities.  Last echocardiogram revealed EF of 55-60% with grade 1 diastolic dysfunction in 02/9242. -Monitor intake and output  -Daily weights -Add on BNP -Follow-up echocardiogram  Hypochromic anemia: Hemoglobin 9.5 g/dL which appears similar to patient's hemoglobin at discharge in May.  Patient denies any reports of blood in stools. -Recheck CBC tomorrow morning  Diabetes mellitus type 2: Glucose on  admission elevated up to 178.  Last hemoglobin A1c was 5.4 on 05/29/2020.  Home medications appear to previously include metformin. -Check hemoglobin A1c -Heart healthy and carb modified diet -Will consider placing patient on sliding scale insulin if blood sugars elevated greater than 180  Hyperlipidemia: LDL 120, HDL 38, cholesterol 167, and trigycerides 43. -Restart atorvastatin  80 mg daily   DVT prophylaxis: Lovenox Code Status: Full Family Communication:Son updated over the phone Disposition Plan: To be determined Consults called: Message sent for cardiology to eval tomorrow morning Admission status: Observation  Norval Morton MD [facility]   If 7PM-7AM, please contact night-coverage   09/03/2020, 8:08 AM

## 2020-09-03 NOTE — Consult Note (Addendum)
Cardiology Consultation:   Patient ID: Darrell [name] MRN: [id number]; DOB: 05-Nov-1955  Admit date: 09/03/2020 Date of Consult: 09/03/2020  PCP:  [name], MD   [facility] Providers Cardiologist:  Minus Breeding, MD       Patient Profile:   Darrell [name] is a 65 y.o. male with a hx of HTN, HLD, CAD s/p CABG 2009 and PCI 2017, HFpEF, and CKD stage III, med noncompliance, who is being seen 09/03/2020 for the evaluation of chest pain and elevated troponin, at the request of Dr Tamala Julian.  History of Present Illness:   Mr. Hensch was hospitalized 05/04-05/04/2020 w/ HTN emergency, BP 260s/160s on admit. Off meds. Meds restarted. CP resolved w/ BP improvement, trop elevated but flat, in the 400s.  Mr Burbach does not have chest pain when his BP is ok.   He did not take his meds because he only got 2 scripts at his last d/c but he was out of everything. He did not call our office to let us know he was out of meds. He has been out since his 30-day scripts ran out in June.  He does not wake with LE edema. He denies orthopnea or PND.   No hx of exertional CP except when BP is very high.   No history of palpitations, presyncope or syncope.  He does not have a BP cuff.    Past Medical History:  Diagnosis Date   CKD (chronic kidney disease), stage III ([place])    Claudication ([place])    Coronary artery disease    a. 08/2001 s/p DES to RCA/RI;  b. 07/2006 s/p DES to p/d LCX;  c. 11/2007 CABGx5 (LIMA->LAD, VG->D1, LRA->OM1, VG->PDA->LPL); c. 03/2015: Synergy DES to mid-Cx in the setting of NSTEMI; d. 11/2015 NSTEMI->Med Rx. e. NSTEMI 01/2018 -> med rx.   Diabetes mellitus type 2 in nonobese [facility])    a. A1c 7.0 09/2012.   Diastolic dysfunction    a. 02/2016 Echo: EF 50-55%, gr1DD, Ao sclerosis, dil Ao root (81mm), sev dil LA, triv TR, PASP 75mmHg.   Hyperlipidemia    Hypertension    Noncompliance    Right carotid bruit    a. noted 01/2018, will need OP eval when patient complies  with follow-up.    Past Surgical History:  Procedure Laterality Date   CARDIAC CATHETERIZATION N/A 04/06/2015   Procedure: Left Heart Cath and Cors/Grafts Angiography;  Surgeon: Lorretta Harp, MD;  Location: [place] [facility];  Service: Cardiovascular;  Laterality: N/A;   CARDIAC CATHETERIZATION N/A 04/06/2015   Procedure: Coronary Stent Intervention;  Surgeon: Lorretta Harp, MD;  2.5 mm x 12 mm long Synergy DES followed by  2.5 mm x 16 mm long Synergy DES     CORONARY ARTERY BYPASS GRAFT  2009   2009 LIMA to LAD, SVG to Diag, SVG to PDA and PL, left radial to OM   CORONARY BALLOON ANGIOPLASTY N/A 05/04/2018   Procedure: CORONARY BALLOON ANGIOPLASTY;  Surgeon: Lorretta Harp, MD;  Location: [place] [facility];  Service: Cardiovascular;  Laterality: N/A;  Prox CFX   LEFT HEART CATH N/A 10/24/2012   Procedure: LEFT HEART CATH;  Surgeon: Troy Sine, MD;  Location: [facility] C[facility] LAB;  Service: Cardiovascular;  Laterality: N/A;   LEFT HEART CATH AND CORS/GRAFTS ANGIOGRAPHY N/A 05/04/2018   Procedure: LEFT HEART CATH AND CORS/GRAFTS ANGIOGRAPHY;  Surgeon: Lorretta Harp, MD;  Location: [place] [facility];  Service: Cardiovascular;  Laterality: N/A;  Home Medications:  Prior to Admission medications   Medication Sig Start Date End Date Taking? Authorizing Provider  aspirin 81 MG chewable tablet Chew 81 mg by mouth every 6 (six) hours as needed (chest pain).   Yes [provider]  amLODipine (NORVASC) 10 MG tablet Take 1 tablet (10 mg total) by mouth daily. Please make PCP appointment. Patient not taking: No sig reported 11/02/19   [name], MD  atorvastatin (LIPITOR) 80 MG tablet Take 1 tablet (80 mg total) by mouth every evening. Patient not taking: No sig reported 11/02/19   [name], MD  Blood Glucose Monitoring Suppl (TRUE METRIX METER) w/Device KIT USE AS INSTRUCTED Patient taking differently: 1 each by Other route as directed. 07/05/16   [name], MD   carvedilol (COREG) 25 MG tablet Take 1 tablet (25 mg total) by mouth 2 (two) times daily with a meal. Please make PCP appointment. Patient not taking: No sig reported 11/02/19   [name], MD  clopidogrel (PLAVIX) 75 MG tablet Take 1 tablet (75 mg total) by mouth daily. Please make PCP appointment. Patient not taking: No sig reported 11/02/19   [name], MD  glucose blood (TRUE METRIX BLOOD GLUCOSE TEST) test strip 1 each by Other route 3 (three) times daily. 04/27/19   Mayers, Cari S, PA-C  hydrALAZINE (APRESOLINE) 50 MG tablet Take 1 tablet (50 mg total) by mouth 2 (two) times daily. Patient not taking: No sig reported 05/31/20   Iona Beard, MD  isosorbide mononitrate (IMDUR) 120 MG 24 hr tablet Take 1 tablet (120 mg total) by mouth daily. Please make PCP appointment. Patient not taking: No sig reported 11/02/19   [name], MD  Lancet Devices [facility]) lancets Use as instructed daily. Patient taking differently: 1 each by Other route as directed. 07/05/16   [name], MD  losartan-hydrochlorothiazide (HYZAAR) 50-12.5 MG tablet Take 1 tablet by mouth daily. Patient not taking: Reported on 09/03/2020 05/31/20 07/01/20  Iona Beard, MD  metFORMIN (GLUCOPHAGE) 500 MG tablet TAKE 1 TABLET (500 MG TOTAL) BY MOUTH DAILY WITH BREAKFAST. Patient not taking: Reported on 09/03/2020 11/02/19 11/01/20  [name], MD  nitroGLYCERIN (NITROSTAT) 0.4 MG SL tablet PLACE 1 TABLET UNDER THE TONGUE EVERY 5 MINUTES AS NEEDED FOR CHEST PAIN (UP TO 3 DOSES. IF TAKING 3RD DOSE CALL 911) Patient not taking: No sig reported 09/10/19 09/09/20  Ladell Pier, MD  tamsulosin (FLOMAX) 0.4 MG CAPS capsule Take 1 capsule (0.4 mg total) by mouth daily. Please make PCP appointment. Patient not taking: No sig reported 11/02/19   [name], MD  TRUEplus Lancets 28G MISC USE AS INSTRUCTED Patient taking differently: 1 each by Other route as directed. 04/27/19   Mayers, Loraine Grip, PA-C     Inpatient Medications: Scheduled Meds:  amLODipine  5 mg Oral Daily   aspirin EC  81 mg Oral Daily   atorvastatin  80 mg Oral Daily   clopidogrel  75 mg Oral Daily   enoxaparin (LOVENOX) injection  40 mg Subcutaneous Q24H   hydrALAZINE  50 mg Oral BID   Continuous Infusions:  PRN Meds: acetaminophen, hydrALAZINE, nitroGLYCERIN, ondansetron (ZOFRAN) IV  Allergies:   No Known Allergies  Social History:   Social History   Socioeconomic History   Marital status: Married    Spouse name: Not on file   Number of children: 3   Years of education: Not on file   Highest education level: Not on file  Occupational History   Not  on file  Tobacco Use   Smoking status: Never   Smokeless tobacco: Never  Vaping Use   Vaping Use: Never used  Substance and Sexual Activity   Alcohol use: No    Comment: sts he drank for the first time in a long time last night    Drug use: No   Sexual activity: Not on file  Other Topics Concern   Not on file  Social History Narrative   Lives at home with wife.     Social Determinants of Health   Financial Resource Strain: Not on file  Food Insecurity: Not on file  Transportation Needs: Not on file  Physical Activity: Not on file  Stress: Not on file  Social Connections: Not on file  Intimate Partner Violence: Not on file    Family History:    Family History  Problem Relation Age of Onset   Heart failure Mother    Cancer Mother    CAD Mother 60   Heart failure Father    Kidney failure Brother      ROS:  Please see the history of present illness.  All other ROS reviewed and negative.     Physical Exam/Data:   Vitals:   09/03/20 1230 09/03/20 1300 09/03/20 1333 09/03/20 1535  BP: (!) 180/100 (!) 168/100 (!) 172/83 (!) 153/81  Pulse: 79 88 87 86  Resp: (!) 21 (!) 26 (!) 23 20  Temp:  98.8 F (37.1 C) 98.1 F (36.7 C) 98.9 F (37.2 C)  TempSrc:   Oral Oral  SpO2: 100% 100% 100% 100%  Weight:   104 kg   Height:   '6\' 1"'$   (1.854 m)    No intake or output data in the 24 hours ending 09/03/20 1642 Last 3 Weights 09/03/2020 05/29/2020 05/29/2020  Weight (lbs) 229 lb 4.5 oz 227 lb 4.7 oz 234 lb  Weight (kg) 104 kg 103.1 kg 106.142 kg     Body mass index is 30.25 kg/m.  General:  Well nourished, well developed, male who appears older than his stated age, in no acute distress HEENT: normal for age with poor dentition Lymph: no adenopathy Neck: no JVD Endocrine:  No thryomegaly Vascular: No carotid bruits; upper extremity pulses 2+, lower extremity pulses 1-2+ Cardiac:  normal S1, S2; RRR; no murmur  Lungs:  few rales bases bilaterally, no wheezing, rhonchi  Abd: soft, nontender, no hepatomegaly  Ext: no edema Musculoskeletal:  No deformities, BUE and BLE strength normal and equal Skin: warm and dry  Neuro:  CNs 2-12 intact, no focal abnormalities noted Psych:  Normal affect   EKG:  The EKG was personally reviewed and demonstrates: Sinus tachycardia, heart rate 108, nonspecific IVCD with QRS duration 125 ms Telemetry:  Telemetry was personally reviewed and demonstrates: Sinus rhythm, sinus tach with either a couple of PVCs or just artifact  Relevant CV Studies:  ECHO: 05/30/2020  1. Left ventricular ejection fraction, by estimation, is 55 to 60%. The  left ventricle has normal function. The left ventricle has no regional  wall motion abnormalities. There is severe left ventricular hypertrophy. Left ventricular diastolic parameters  are consistent with Grade I diastolic dysfunction (impaired relaxation).   2. Right ventricular systolic function is normal. The right ventricular  size is normal. There is mildly elevated pulmonary artery systolic  pressure. The estimated right ventricular systolic pressure is 70.6 mmHg.   3. Left atrial size was severely dilated.   4. The mitral valve is normal in structure. Trivial mitral  valve  regurgitation. No evidence of mitral stenosis.   5. The aortic valve is normal in  structure. Aortic valve regurgitation is trivial. No aortic stenosis is present. Aortic regurgitation PHT measures 733 msec.   6. Aortic dilatation noted. There is moderate dilatation of the aortic  root, measuring 47 mm. There is mild dilatation of the ascending aorta, measuring 42 mm.   7. The inferior vena cava is normal in size with greater than 50%  respiratory variability, suggesting right atrial pressure of 3 mmHg.   CARDIAC CATH:  05/04/2018 Mid RCA lesion is 100% stenosed. Ost RCA to Prox RCA lesion is 99% stenosed. Ost LAD to Prox LAD lesion is 100% stenosed. Mid Cx to Dist Cx lesion is 100% stenosed. Origin to Prox Graft lesion is 100% stenosed. Mid LAD to Dist LAD lesion is 90% stenosed with 100% stenosed side branch in Ost 3rd Sept. Origin to Prox Graft lesion is 100% stenosed. Ost Cx to Prox Cx lesion is 99% stenosed. Scoring balloon angioplasty was performed using a BALLOON WOLVERINE 2.75X10. Post intervention, there is a 30% residual stenosis. The left ventricular ejection fraction is 50-55% by visual estimate. LV end diastolic pressure is normal.  Diagnostic Dominance: Right    Intervention     Laboratory Data:  High Sensitivity Troponin:   Recent Labs  Lab 09/03/20 0334 09/03/20 0529 09/03/20 1525  TROPONINIHS 130* 340* 1,371*     Chemistry Recent Labs  Lab 09/03/20 0334  NA 137  K 4.6  CL 106  CO2 21*  GLUCOSE 178*  BUN 32*  CREATININE 1.94*  CALCIUM 8.6*  GFRNONAA 38*  ANIONGAP 10    No results for input(s): PROT, ALBUMIN, AST, ALT, ALKPHOS, BILITOT in the last 168 hours. Hematology Recent Labs  Lab 09/03/20 0334  WBC 8.2  RBC 3.98*  HGB 9.5*  HCT 32.2*  MCV 80.9  MCH 23.9*  MCHC 29.5*  RDW 14.9  PLT 241   BNP Recent Labs  Lab 09/03/20 1525  BNP 1,578.3*    DDimer No results for input(s): DDIMER in the last 168 hours. Lab Results  Component Value Date   TSH 1.151 05/29/2020   Lab Results  Component Value Date   CHOL  167 09/03/2020   HDL 38 (L) 09/03/2020   LDLCALC 120 (H) 09/03/2020   TRIG 43 09/03/2020   CHOLHDL 4.4 09/03/2020   Lab Results  Component Value Date   HGBA1C 5.6 09/03/2020     Radiology/Studies:  DG Chest Port 1 View  Result Date: 09/03/2020 CLINICAL DATA:  Central chest pain beginning on Saturday EXAM: PORTABLE CHEST 1 VIEW COMPARISON:  05/29/2020 FINDINGS: Cardiomegaly. CABG. There is no edema, consolidation, effusion, or pneumothorax. Unremarkable sternal wires IMPRESSION: No evidence of acute disease. Cardiomegaly. Electronically Signed   By: Monte Fantasia M.D.   On: 09/03/2020 04:54     Assessment and Plan:   Chest pain, elevated troponin, non-STEMI -The chest pain and elevated troponin occurred in the setting of uncontrolled hypertension with hypertensive emergency. -At his last cath, although they were able to stent the circumflex, he has multiple areas for ischemia in distal vessels - Discuss with MD if another cath is indicated at this time - Follow-up on echo  2.  Med noncompliance - Explained to him that if he contacted our office, we would send in new prescriptions or try to help him get needed medications. -We will put our contact information on his AVS  Otherwise, per IM Active Problems:   Type  2 diabetes mellitus without complication, without long-term current use of insulin ([place])   HYPERCHOLESTEROLEMIA   Coronary atherosclerosis, s/p CABG in 2009    Elevated troponin   Hypertensive emergency   Chest pain   Acute kidney injury superimposed on chronic kidney disease ([place])   Hyperlipidemia with target LDL less than 70  Risk Assessment/Risk Scores:     HEAR Score (for undifferentiated chest pain):  HEAR Score: 5     For questions or updates, please contact [place] Please consult [email or web address] for contact info under    Signed, Rosaria Ferries, PA-C  09/03/2020 4:42 PM  History and all data above reviewed.  Patient examined.  I agree with the  findings as above.  Patient is known to me from previous visits.  He has a history of significant coronary disease and has not been compliant with medications for follow-up routinely.  His last angioplasty was 2020 and I did review cath films from this visit.  He had high-grade circumflex native vessel disease and required angioplasty.  Status post bypass.  He been admitted a couple of times with chest discomfort and mildly elevated troponins when he runs out of meds and he gets hypotensive.  He has been out of his meds for several weeks.  He was doing well.  He goes up and down stairs in his apartment that has been in for a year with his wife.  He has not been getting any discomfort until yesterday.  He had discomfort at rest.  It is substernal.  It was severe, out of 10.  It was similar to his previous angina.  He is a little bit diaphoretic but he is a little bit short of breath.  He did not have any medicines to take.  EMS treated him with sublingual nitroglycerin and he reports improvement.  His enzymes are elevated higher than previous hypertensive urgency enzymes.  He has been pain-free since admission.  Blood pressure still mildly elevated but coming down.  The patient exam reveals COR: Regular rate and rhythm, no rubs, murmurs,  Lungs: Clear to auscultation bilaterally,  Abd: Positive bowel sounds normal in frequency and pitch, Ext no edema, diminished dorsalis pedis pulse tibialis bilaterally.   Neck: Bilateral bruits all available labs, radiology testing, previous records reviewed. Agree with documented assessment and plan.  Chest pain: His elevated enzymes and chest pain could be related to his hypertensive urgency but given the enzyme elevation and the severity of his symptoms cardiac catheterization is indicated.   The patient understands that risks included but are not limited to stroke (1 in 1000), death (1 in 69), kidney failure [usually temporary] (1 in 500), bleeding (1 in 200), allergic  reaction [possibly serious] (1 in 200).  The patient understands and agrees to proceed.     Bruits: He needs carotid Dopplers.   HTN: He is reasonable to restart his beta-blocker and hold off on his [name] as below..   CKD IIIB: We need to check his creatinine tomorrow before catheterization.  I will give him any diuretic which she has been taking at home for pulse pressure.  Avoid his [name] for now.   Jeneen Rinks Cayden Granholm  5:06 PM  09/03/2020

## 2020-09-03 NOTE — ED Provider Notes (Signed)
[facility] EMERGENCY DEPARTMENT Provider Note   CSN: [id number] Arrival date & time: 09/03/20  0151     History No chief complaint on file.   Darrell YURKOVICH is a 65 y.o. male.  Patient is a 65 year old male with past medical history of coronary artery disease with prior CABG x5 and multiple stents.  Patient presenting today with complaints of elevated blood pressure and chest pain.  This started earlier this evening in the absence of any injury or trauma.  He describes pressure to the center of his chest with associated shortness of breath.  He denies diaphoresis, nausea.  Patient describes this as feeling similar to what he has felt with his prior heart pain.  The history is provided by the patient.      Past Medical History:  Diagnosis Date   CKD (chronic kidney disease), stage III ([place])    Claudication ([place])    Coronary artery disease    a. 08/2001 s/p DES to RCA/RI;  b. 07/2006 s/p DES to p/d LCX;  c. 11/2007 CABGx5 (LIMA->LAD, VG->D1, LRA->OM1, VG->PDA->LPL); c. 03/2015: Synergy DES to mid-Cx in the setting of NSTEMI; d. 11/2015 NSTEMI->Med Rx. e. NSTEMI 01/2018 -> med rx.   Diabetes mellitus type 2 in nonobese [facility])    a. A1c 7.0 09/2012.   Diastolic dysfunction    a. 02/2016 Echo: EF 50-55%, gr1DD, Ao sclerosis, dil Ao root (84m), sev dil LA, triv TR, PASP 258mg.   Hyperlipidemia    Hypertension    Noncompliance    Right carotid bruit    a. noted 01/2018, will need OP eval when patient complies with follow-up.    Patient Active Problem List   Diagnosis Date Noted   Hematuria    Bleeding gums 08/01/2018   Ischemic chest pain (H[facility] [place]07/03/2018   Renal insufficiency    ACS (acute coronary syndrome) (HC[facility]04/05/2018   Mild anemia 02/08/2018   CKD (chronic kidney disease), stage III (HCC)    Diabetes mellitus type 2 in nonobese (HCC)    Hyperlipidemia with target LDL less than 70    Hypertension    Right carotid bruit    Sepsis ([facility]02/03/2016    Chest pain    Hyponatremia 01/03/2016   Hypertensive emergency    Non-ST elevation (NSTEMI) myocardial infarction ([facility]03/07/2015   HTN (hypertension) 04/04/2015   Hypertensive urgency 03/25/2014   History of noncompliance with medical treatment, presenting hazards to health 03/25/2014   Claudication ([facility]10/01/2012   GERD 10/23/2007   NEPHROLITHIASIS 10/23/2007   HYPERCHOLESTEROLEMIA 10/22/2007   ADJUSTMENT DISORDER WITH DEPRESSED MOOD 10/22/2007   Type 2 diabetes mellitus without complication, without long-term current use of insulin (HCJeane[place]01/01/2003   Coronary atherosclerosis, s/p CABG in 2009  01/28/2001    Past Surgical History:  Procedure Laterality Date   CARDIAC CATHETERIZATION N/A 04/06/2015   Procedure: Left Heart Cath and Cors/Grafts Angiography;  Surgeon: JoLorretta HarpMD;  [facility];  Service: Cardiovascular;  Laterality: N/A;   CARDIAC CATHETERIZATION N/A 04/06/2015   Procedure: Coronary Stent Intervention;  Surgeon: JoLorretta HarpMD;  2.5 mm x 12 mm long Synergy DES followed by  2.5 mm x 16 mm long Synergy DES     CORONARY ARTERY BYPASS GRAFT     2009 LIMA to LAD, SVG to Diag, SVG to PDA and PL, left radial to OM   CORONARY BALLOON ANGIOPLASTY N/A 05/04/2018   Procedure: CORONARY BALLOON ANGIOPLASTY;  Surgeon: BeLorretta HarpMD;  Location: [facility] CV LAB;  Service: Cardiovascular;  Laterality: N/A;  Prox CFX   LEFT HEART CATH N/A 10/24/2012   Procedure: LEFT HEART CATH;  Surgeon: Troy Sine, MD;  Location: [facility] CATH LAB;  Service: Cardiovascular;  Laterality: N/A;   LEFT HEART CATH AND CORS/GRAFTS ANGIOGRAPHY N/A 05/04/2018   Procedure: LEFT HEART CATH AND CORS/GRAFTS ANGIOGRAPHY;  Surgeon: Lorretta Harp, MD;  Location: [place] CV LAB;  Service: Cardiovascular;  Laterality: N/A;       Family History  Problem Relation Age of Onset   Heart failure Mother    Cancer Mother    CAD Mother 61   Heart failure Father    Kidney  failure Brother     Social History   Tobacco Use   Smoking status: Former    Types: Cigarettes    Quit date: 10/25/2010    Years since quitting: 9.8   Smokeless tobacco: Never  Vaping Use   Vaping Use: Never used  Substance Use Topics   Alcohol use: No    Comment: sts he drank for the first time in a long time last night    Drug use: No    Home Medications Prior to Admission medications   Medication Sig Start Date End Date Taking? Authorizing Provider  amLODipine (NORVASC) 10 MG tablet Take 1 tablet (10 mg total) by mouth daily. Please make PCP appointment. 11/02/19   [name], MD  atorvastatin (LIPITOR) 80 MG tablet Take 1 tablet (80 mg total) by mouth every evening. 11/02/19   [name], MD  Blood Glucose Monitoring Suppl (TRUE METRIX METER) w/Device KIT USE AS INSTRUCTED Patient taking differently: 1 each by Other route as directed. 07/05/16   [name], MD  carvedilol (COREG) 25 MG tablet Take 1 tablet (25 mg total) by mouth 2 (two) times daily with a meal. Please make PCP appointment. 11/02/19   [name], MD  clopidogrel (PLAVIX) 75 MG tablet Take 1 tablet (75 mg total) by mouth daily. Please make PCP appointment. 11/02/19   [name], MD  glucose blood (TRUE METRIX BLOOD GLUCOSE TEST) test strip 1 each by Other route 3 (three) times daily. 04/27/19   Mayers, Cari S, PA-C  hydrALAZINE (APRESOLINE) 50 MG tablet Take 1 tablet (50 mg total) by mouth 2 (two) times daily. 05/31/20   Iona Beard, MD  isosorbide mononitrate (IMDUR) 120 MG 24 hr tablet Take 1 tablet (120 mg total) by mouth daily. Please make PCP appointment. 11/02/19   [name], MD  Lancet Devices [facility]) lancets Use as instructed daily. Patient taking differently: 1 each by Other route as directed. 07/05/16   [name], MD  losartan-hydrochlorothiazide (HYZAAR) 50-12.5 MG tablet Take 1 tablet by mouth daily. 05/31/20 07/01/20  Iona Beard, MD  metFORMIN (GLUCOPHAGE) 500  MG tablet TAKE 1 TABLET (500 MG TOTAL) BY MOUTH DAILY WITH BREAKFAST. Patient taking differently: Take 500 mg by mouth daily with breakfast. 11/02/19 11/01/20  [name], MD  nitroGLYCERIN (NITROSTAT) 0.4 MG SL tablet PLACE 1 TABLET UNDER THE TONGUE EVERY 5 MINUTES AS NEEDED FOR CHEST PAIN (UP TO 3 DOSES. IF TAKING 3RD DOSE CALL 911) Patient taking differently: Place 0.4 mg under the tongue every 5 (five) minutes as needed for chest pain. 09/10/19 09/09/20  Ladell Pier, MD  tamsulosin (FLOMAX) 0.4 MG CAPS capsule Take 1 capsule (0.4 mg total) by mouth daily. Please make PCP appointment. 11/02/19   [name], MD  TRUEplus Lancets 28G MISC USE AS  INSTRUCTED Patient taking differently: 1 each by Other route as directed. 04/27/19   Mayers, Cari S, PA-C    Allergies    Patient has no known allergies.  Review of Systems   Review of Systems  All other systems reviewed and are negative.  Physical Exam Updated Vital Signs BP (!) 249/137   Pulse (!) 107   Resp 18   SpO2 98%   Physical Exam Vitals and nursing note reviewed.  Constitutional:      General: He is not in acute distress.    Appearance: He is well-developed. He is not diaphoretic.  HENT:     Head: Normocephalic and atraumatic.  Cardiovascular:     Rate and Rhythm: Normal rate and regular rhythm.     Heart sounds: No murmur heard.   No friction rub.  Pulmonary:     Effort: Pulmonary effort is normal. No respiratory distress.     Breath sounds: Normal breath sounds. No wheezing or rales.  Abdominal:     General: Bowel sounds are normal. There is no distension.     Palpations: Abdomen is soft.     Tenderness: There is no abdominal tenderness.  Musculoskeletal:        General: No swelling or tenderness. Normal range of motion.     Cervical back: Normal range of motion and neck supple.     Right lower leg: No edema.     Left lower leg: No edema.  Skin:    General: Skin is warm and dry.  Neurological:      Mental Status: He is alert and oriented to person, place, and time.     Coordination: Coordination normal.    ED Results / Procedures / Treatments   Labs (all labs ordered are listed, but only abnormal results are displayed) Labs Reviewed  BASIC METABOLIC PANEL  CBC  TROPONIN I (HIGH SENSITIVITY)  TROPONIN I (HIGH SENSITIVITY)    EKG EKG Interpretation  Date/Time:  Sunday September 03 2020 03:13:31 EDT Ventricular Rate:  108 PR Interval:  183 QRS Duration: 125 QT Interval:  366 QTC Calculation: 491 R Axis:   104 Text Interpretation: Sinus tachycardia Nonspecific intraventricular conduction delay Anteroseptal infarct, old Minimal ST depression, inferior leads Confirmed by Veryl Speak [phone]) on 09/03/2020 3:21:00 AM  Radiology No results found.  Procedures Procedures   Medications Ordered in ED Medications - No data to display  ED Course  I have reviewed the triage vital signs and the nursing notes.  Pertinent labs & imaging results that were available during my care of the patient were reviewed by me and considered in my medical decision making (see chart for details).    MDM Rules/Calculators/A&P  Patient presenting with complaints of elevated blood pressure and chest discomfort.  Patient with recent admission for hypertensive crisis with elevated troponin.  Today's troponin is also elevated.  Patient given medication for his blood pressure along with nitroglycerin.  Patient will require admission for further observation.  CRITICAL CARE Performed by: Veryl Speak Total critical care time: 35 minutes Critical care time was exclusive of separately billable procedures and treating other patients. Critical care was necessary to treat or prevent imminent or life-threatening deterioration. Critical care was time spent personally by me on the following activities: development of treatment plan with patient and/or surrogate as well as nursing, discussions with consultants,  evaluation of patient's response to treatment, examination of patient, obtaining history from patient or surrogate, ordering and performing treatments and interventions, ordering and review of  laboratory studies, ordering and review of radiographic studies, pulse oximetry and re-evaluation of patient's condition.   Final Clinical Impression(s) / ED Diagnoses Final diagnoses:  Pain    Rx / DC Orders ED Discharge Orders     None        Veryl Speak, MD 09/06/20 [phone]

## 2020-09-03 NOTE — ED Notes (Signed)
Pt decontaminated in shower room.

## 2020-09-04 ENCOUNTER — Encounter (HOSPITAL_COMMUNITY)
Admission: EM | Disposition: A | Discharge status: Home / Self Care | Payer: Self-pay | Source: Home / Self Care | Attending: Family Medicine

## 2020-09-04 ENCOUNTER — Observation Stay (HOSPITAL_COMMUNITY): Payer: Medicare Other

## 2020-09-04 DIAGNOSIS — Z79899 Other long term (current) drug therapy: Secondary | ICD-10-CM | POA: Diagnosis not present

## 2020-09-04 DIAGNOSIS — I5032 Chronic diastolic (congestive) heart failure: Secondary | ICD-10-CM | POA: Diagnosis not present

## 2020-09-04 DIAGNOSIS — R079 Chest pain, unspecified: Secondary | ICD-10-CM | POA: Diagnosis not present

## 2020-09-04 DIAGNOSIS — N189 Chronic kidney disease, unspecified: Secondary | ICD-10-CM | POA: Diagnosis not present

## 2020-09-04 DIAGNOSIS — Z9114 Patient's other noncompliance with medication regimen: Secondary | ICD-10-CM | POA: Diagnosis not present

## 2020-09-04 DIAGNOSIS — Z7984 Long term (current) use of oral hypoglycemic drugs: Secondary | ICD-10-CM | POA: Diagnosis not present

## 2020-09-04 DIAGNOSIS — Z955 Presence of coronary angioplasty implant and graft: Secondary | ICD-10-CM | POA: Diagnosis not present

## 2020-09-04 DIAGNOSIS — I130 Hypertensive heart and chronic kidney disease with heart failure and stage 1 through stage 4 chronic kidney disease, or unspecified chronic kidney disease: Secondary | ICD-10-CM | POA: Diagnosis not present

## 2020-09-04 DIAGNOSIS — Z87891 Personal history of nicotine dependence: Secondary | ICD-10-CM | POA: Diagnosis not present

## 2020-09-04 DIAGNOSIS — N179 Acute kidney failure, unspecified: Secondary | ICD-10-CM | POA: Diagnosis not present

## 2020-09-04 DIAGNOSIS — Z7902 Long term (current) use of antithrombotics/antiplatelets: Secondary | ICD-10-CM | POA: Diagnosis not present

## 2020-09-04 DIAGNOSIS — I161 Hypertensive emergency: Secondary | ICD-10-CM | POA: Diagnosis not present

## 2020-09-04 DIAGNOSIS — R778 Other specified abnormalities of plasma proteins: Secondary | ICD-10-CM

## 2020-09-04 DIAGNOSIS — I25710 Atherosclerosis of autologous vein coronary artery bypass graft(s) with unstable angina pectoris: Secondary | ICD-10-CM | POA: Diagnosis not present

## 2020-09-04 DIAGNOSIS — D509 Iron deficiency anemia, unspecified: Secondary | ICD-10-CM | POA: Diagnosis not present

## 2020-09-04 DIAGNOSIS — Z841 Family history of disorders of kidney and ureter: Secondary | ICD-10-CM | POA: Diagnosis not present

## 2020-09-04 DIAGNOSIS — Z9119 Patient's noncompliance with other medical treatment and regimen: Secondary | ICD-10-CM | POA: Diagnosis not present

## 2020-09-04 DIAGNOSIS — E1122 Type 2 diabetes mellitus with diabetic chronic kidney disease: Secondary | ICD-10-CM | POA: Diagnosis not present

## 2020-09-04 DIAGNOSIS — Z809 Family history of malignant neoplasm, unspecified: Secondary | ICD-10-CM | POA: Diagnosis not present

## 2020-09-04 DIAGNOSIS — E785 Hyperlipidemia, unspecified: Secondary | ICD-10-CM | POA: Diagnosis not present

## 2020-09-04 DIAGNOSIS — E1151 Type 2 diabetes mellitus with diabetic peripheral angiopathy without gangrene: Secondary | ICD-10-CM | POA: Diagnosis not present

## 2020-09-04 DIAGNOSIS — Z951 Presence of aortocoronary bypass graft: Secondary | ICD-10-CM | POA: Diagnosis not present

## 2020-09-04 DIAGNOSIS — Z8249 Family history of ischemic heart disease and other diseases of the circulatory system: Secondary | ICD-10-CM | POA: Diagnosis not present

## 2020-09-04 DIAGNOSIS — I214 Non-ST elevation (NSTEMI) myocardial infarction: Secondary | ICD-10-CM | POA: Diagnosis not present

## 2020-09-04 DIAGNOSIS — I252 Old myocardial infarction: Secondary | ICD-10-CM | POA: Diagnosis not present

## 2020-09-04 DIAGNOSIS — N1832 Chronic kidney disease, stage 3b: Secondary | ICD-10-CM | POA: Diagnosis not present

## 2020-09-04 DIAGNOSIS — Z20822 Contact with and (suspected) exposure to covid-19: Secondary | ICD-10-CM | POA: Diagnosis not present

## 2020-09-04 DIAGNOSIS — I2510 Atherosclerotic heart disease of native coronary artery without angina pectoris: Secondary | ICD-10-CM | POA: Diagnosis present

## 2020-09-04 LAB — NM MYOCAR MULTI W/SPECT W/WALL MOTION / EF
Estimated workload: 1 METS
Exercise duration (min): 11 min
Exercise duration (sec): 40 s
Peak HR: 95 {beats}/min
Rest HR: 85 {beats}/min

## 2020-09-04 LAB — COMPREHENSIVE METABOLIC PANEL
ALT: 17 U/L (ref 0–44)
AST: 20 U/L (ref 15–41)
Albumin: 2.7 g/dL — ABNORMAL LOW (ref 3.5–5.0)
Alkaline Phosphatase: 62 U/L (ref 38–126)
Anion gap: 6 (ref 5–15)
BUN: 33 mg/dL — ABNORMAL HIGH (ref 8–23)
CO2: 22 mmol/L (ref 22–32)
Calcium: 8.4 mg/dL — ABNORMAL LOW (ref 8.9–10.3)
Chloride: 108 mmol/L (ref 98–111)
Creatinine, Ser: 1.89 mg/dL — ABNORMAL HIGH (ref 0.61–1.24)
GFR, Estimated: 39 mL/min — ABNORMAL LOW (ref >60)
Glucose, Bld: 114 mg/dL — ABNORMAL HIGH (ref 70–99)
Potassium: 4.4 mmol/L (ref 3.5–5.1)
Sodium: 136 mmol/L (ref 135–145)
Total Bilirubin: 0.5 mg/dL (ref 0.3–1.2)
Total Protein: 6 g/dL — ABNORMAL LOW (ref 6.5–8.1)

## 2020-09-04 LAB — CBC
HCT: 26.9 % — ABNORMAL LOW (ref 39.0–52.0)
Hemoglobin: 7.8 g/dL — ABNORMAL LOW (ref 13.0–17.0)
MCH: 23.4 pg — ABNORMAL LOW (ref 26.0–34.0)
MCHC: 29 g/dL — ABNORMAL LOW (ref 30.0–36.0)
MCV: 80.5 fL (ref 80.0–100.0)
Platelets: 186 10*3/uL (ref 150–400)
RBC: 3.34 MIL/uL — ABNORMAL LOW (ref 4.22–5.81)
RDW: 15.1 % (ref 11.5–15.5)
WBC: 5.4 10*3/uL (ref 4.0–10.5)
nRBC: 0 % (ref 0.0–0.2)

## 2020-09-04 LAB — HEMOGLOBIN AND HEMATOCRIT, BLOOD
HCT: 29.2 % — ABNORMAL LOW (ref 39.0–52.0)
Hemoglobin: 8.5 g/dL — ABNORMAL LOW (ref 13.0–17.0)

## 2020-09-04 LAB — BASIC METABOLIC PANEL
Anion gap: 7 (ref 5–15)
BUN: 31 mg/dL — ABNORMAL HIGH (ref 8–23)
CO2: 20 mmol/L — ABNORMAL LOW (ref 22–32)
Calcium: 8.4 mg/dL — ABNORMAL LOW (ref 8.9–10.3)
Chloride: 110 mmol/L (ref 98–111)
Creatinine, Ser: 1.67 mg/dL — ABNORMAL HIGH (ref 0.61–1.24)
GFR, Estimated: 45 mL/min — ABNORMAL LOW (ref >60)
Glucose, Bld: 128 mg/dL — ABNORMAL HIGH (ref 70–99)
Potassium: 4.5 mmol/L (ref 3.5–5.1)
Sodium: 137 mmol/L (ref 135–145)

## 2020-09-04 SURGERY — LEFT HEART CATH AND CORS/GRAFTS ANGIOGRAPHY
Anesthesia: LOCAL

## 2020-09-04 MED ORDER — NITROGLYCERIN 0.4 MG SL SUBL
SUBLINGUAL_TABLET | SUBLINGUAL | Status: AC
  Filled 2020-09-04: qty 1

## 2020-09-04 MED ORDER — TECHNETIUM TC 99M TETROFOSMIN IV KIT
11.0000 | PACK | Freq: Once | INTRAVENOUS | Status: AC | PRN
  Administered 2020-09-04: 11 via INTRAVENOUS

## 2020-09-04 MED ORDER — REGADENOSON 0.4 MG/5ML IV SOLN
INTRAVENOUS | Status: AC
  Filled 2020-09-04: qty 5

## 2020-09-04 MED ORDER — SODIUM CHLORIDE 0.9 % WEIGHT BASED INFUSION
1.0000 mL/kg/h | INTRAVENOUS | Status: AC
  Administered 2020-09-04: 1 mL/kg/h via INTRAVENOUS

## 2020-09-04 MED ORDER — REGADENOSON 0.4 MG/5ML IV SOLN
0.4000 mg | Freq: Once | INTRAVENOUS | Status: AC
  Administered 2020-09-04: 0.4 mg via INTRAVENOUS

## 2020-09-04 NOTE — Progress Notes (Signed)
Darrell [name] presented for a nuclear stress test today. 9/10 after 4 minutes of stress. Improved with sL nitro x 1.  Stress imaging is pending at this time.   Preliminary EKG findings may be listed in the chart, but the stress test result will not be finalized until perfusion imaging is complete.   [place], [place]  09/04/2020 12:24 PM

## 2020-09-04 NOTE — Progress Notes (Signed)
Pt states chest pain better after nitroglycerin

## 2020-09-04 NOTE — Progress Notes (Addendum)
Stress test is High risk. Scr improved with fluids.   Plan:  Continue fluids overnight. Check CBC and BMP in morning.  Dr. Clifton James to discuss cath option with patient in AM.  Keep NPO.   Findings and plan discussed with patient.

## 2020-09-04 NOTE — Progress Notes (Signed)
C/O of chest pain 9/10. Nitroglycerin SL given.

## 2020-09-04 NOTE — Progress Notes (Addendum)
PROGRESS NOTE    Darrell [name]  RCV:[id number] DOB: Jun 27, 1955 DOA: 09/03/2020 PCP: Hoy Register, MD    Brief Narrative:  This 65 years old male with PMH significant for hypertension, hyperlipidemia, CAD s/p CABG and PCI, HFpEF, and CKD stage III presented in the ED with complaint of chest pain started yesterday night.  Patient describes chest pain as localized, sharp, does not radiate.  He denies any fall or trauma or chest injury.  Patient report taking 4 aspirin without any resolution of chest pain. In the ED patient was found to have chest pain and troponin significantly trending up associated with hypertension.  Cardiology consulted recommended left heart cath.  Patient refused left heart cath and agreed for stress test which is happening today.   Assessment & Plan:   Active Problems:   Type 2 diabetes mellitus without complication, without long-term current use of insulin (HCC)   HYPERCHOLESTEROLEMIA   Coronary atherosclerosis, s/p CABG in 2009    Elevated troponin   Hypertensive emergency   Chest pain   Acute kidney injury superimposed on chronic kidney disease (HCC)   Hyperlipidemia with target LDL less than 70   Chest pain: Patient presented with chest pain with elevated troponins with significant history of CAD with prior CABG and PCI. Last left heart cath in 04/2018 with restenosis of 99% of the Cx and severe diffuse disease of the LAD requiring PCI. Troponin trending up 130> [id number] Continue to trend cardiac troponin. Continue telemetry. Cardiology consulted recommended left heart cath but patient declined. Continue aspirin, Plavix and statin. Patient has agreed for nuclear stress test.   Hypertensive emergency : Blood pressure on arrival 249/137.   Resume home blood pressure medications.  Amlodipine  and hydralazine Hydralazine IV as needed for systolic blood pressure greater than 180. Continue to monitor blood pressure.  AKI on CKD stage  IIIa: Patient presented with serum creatinine 1.94,  Baseline serum creatinine 1.4-1.5 This could be due to hypertensive emergency Avoid nephrotoxic medications, continue monitor renal functions.  Heart failure with preserved EF: Patient does not appear fluid overloaded on exam. Chest x-ray no cardiomegaly. Last echocardiogram LVEF 55 to 60% grade 1 diastolic dysfunction. Monitor daily weight ,  intake with output charting  Hypochromic anemia: Patient denies any blood in the stools no signs of any acute blood loss.   Continue to monitor H&H.     Diabetes mellitus type 2:  Obtain hemoglobin A1c.  Carb modified diet.  Regular insulin sliding scale.   Hyperlipidemia: Continue atorvastatin 80 mg daily   DVT prophylaxis: Lovenox Code Status: Full code Family Communication: No family at bedside Disposition Plan:  Status is: Observation  The patient remains OBS appropriate and will d/c before 2 midnights.  Dispo: The patient is from: Home              Anticipated d/c is to: Home              Patient currently is not medically stable to d/c.   Difficult to place patient No  Consultants:  Cardiology  Procedures:  Nuclear stress test. Antimicrobials:   Anti-infectives (From admission, onward)    None        Subjective: Patient was seen and examined at bedside.  Overnight events noted.   Patient reports chest pain has resolved.  He looks comfortable, he refused to have left heart cath. but agreed to stress test.  Objective: Vitals:   09/04/20 1220 09/04/20 1323 09/04/20 1335 09/04/20 1458  BP: Marland Kitchen)  174/97 (!) 184/130 (!) 180/93 (!) 158/80  Pulse:  82    Resp:  16    Temp:  98.2 F (36.8 C)    TempSrc:  Oral    SpO2:  100%    Weight:      Height:        Intake/Output Summary (Last 24 hours) at 09/04/2020 1514 Last data filed at 09/04/2020 1455 Gross per 24 hour  Intake --  Output 800 ml  Net -800 ml   Filed Weights   09/03/20 1333 09/04/20 0520  Weight: 104  kg 103.9 kg    Examination:  General exam: Appears calm and comfortable, not in any acute distress Respiratory system: Clear to auscultation. Respiratory effort normal. Cardiovascular system: S1 & S2 heard, RRR. No JVD, murmurs, rubs, gallops or clicks. No pedal edema. Gastrointestinal system: Abdomen is nondistended, soft and nontender. No organomegaly or masses felt. Normal bowel sounds heard. Central nervous system: Alert and oriented x 3. No focal neurological deficits. Extremities: No edema, no cyanosis, no clubbing. Skin: No rashes, lesions or ulcers Psychiatry: Judgement and insight appear normal. Mood & affect appropriate.     Data Reviewed: I have personally reviewed following labs and imaging studies  CBC: Recent Labs  Lab 09/03/20 0334 09/04/20 0059  WBC 8.2 5.4  HGB 9.5* 7.8*  HCT 32.2* 26.9*  MCV 80.9 80.5  PLT 241 186   Basic Metabolic Panel: Recent Labs  Lab 09/03/20 0334 09/04/20 0059 09/04/20 1354  NA 137 136 137  K 4.6 4.4 4.5  CL 106 108 110  CO2 21* 22 20*  GLUCOSE 178* 114* 128*  BUN 32* 33* 31*  CREATININE 1.94* 1.89* 1.67*  CALCIUM 8.6* 8.4* 8.4*   GFR: Estimated Creatinine Clearance: 56.6 mL/min (A) (by C-G formula based on SCr of 1.67 mg/dL (H)). Liver Function Tests: Recent Labs  Lab 09/04/20 0059  AST 20  ALT 17  ALKPHOS 62  BILITOT 0.5  PROT 6.0*  ALBUMIN 2.7*   No results for input(s): LIPASE, AMYLASE in the last 168 hours. No results for input(s): AMMONIA in the last 168 hours. Coagulation Profile: No results for input(s): INR, PROTIME in the last 168 hours. Cardiac Enzymes: No results for input(s): CKTOTAL, CKMB, CKMBINDEX, TROPONINI in the last 168 hours. BNP (last 3 results) No results for input(s): PROBNP in the last 8760 hours. HbA1C: Recent Labs    09/03/20 1525  HGBA1C 5.6   CBG: No results for input(s): GLUCAP in the last 168 hours. Lipid Profile: Recent Labs    09/03/20 0529  CHOL 167  HDL 38*   LDLCALC 120*  TRIG 43  CHOLHDL 4.4   Thyroid Function Tests: No results for input(s): TSH, T4TOTAL, FREET4, T3FREE, THYROIDAB in the last 72 hours. Anemia Panel: No results for input(s): VITAMINB12, FOLATE, FERRITIN, TIBC, IRON, RETICCTPCT in the last 72 hours. Sepsis Labs: No results for input(s): PROCALCITON, LATICACIDVEN in the last 168 hours.  Recent Results (from the past 240 hour(s))  SARS CORONAVIRUS 2 (TAT 6-24 HRS) Nasopharyngeal Nasopharyngeal Swab     Status: None   Collection Time: 09/03/20  3:38 AM   Specimen: Nasopharyngeal Swab  Result Value Ref Range Status   SARS Coronavirus 2 NEGATIVE NEGATIVE Final    Comment: (NOTE) SARS-CoV-2 target nucleic acids are NOT DETECTED.  The SARS-CoV-2 RNA is generally detectable in upper and lower respiratory specimens during the acute phase of infection. Negative results do not preclude SARS-CoV-2 infection, do not rule out co-infections with other pathogens,  and should not be used as the sole basis for treatment or other patient management decisions. Negative results must be combined with clinical observations, patient history, and epidemiological information. The expected result is Negative.  Fact Sheet for Patients: HairSlick.nohttps://www.fda.gov/media/138098/download  Fact Sheet for Healthcare Providers: quierodirigir.comhttps://www.fda.gov/media/138095/download  This test is not yet approved or cleared by the [place] FDA and  has been authorized for detection and/or diagnosis of SARS-CoV-2 by FDA under an Emergency Use Authorization (EUA). This EUA will remain  in effect (meaning this test can be used) for the duration of the COVID-19 declaration under Se ction 564(b)(1) of the Act, 21 U.S.C. section 360bbb-3(b)(1), unless the authorization is terminated or revoked sooner.  Performed at [facility], [address] [place], [place] [address]     Radiology Studies: DG Chest Port 1 View  Result Date: 09/03/2020 CLINICAL DATA:   Central chest pain beginning on Saturday EXAM: PORTABLE CHEST 1 VIEW COMPARISON:  05/29/2020 FINDINGS: Cardiomegaly. CABG. There is no edema, consolidation, effusion, or pneumothorax. Unremarkable sternal wires IMPRESSION: No evidence of acute disease. Cardiomegaly. Electronically Signed   By: Marnee SpringJonathon  Watts M.D.   On: 09/03/2020 04:54   ECHOCARDIOGRAM LIMITED  Result Date: 09/03/2020    ECHOCARDIOGRAM LIMITED REPORT   Patient Name:   Manus GunningSTEVEN S Keisler Date of Exam: 09/03/2020 Medical Rec #:  [id number]         Height:       73.0 in Accession #:    [id number]        Weight:       229.3 lb Date of Birth:  1955/04/03         BSA:          2.280 m Patient Age:    65 years          BP:           172/83 mmHg Patient Gender: M                 HR:           87 bpm. Exam Location:  Inpatient Procedure: Limited Echo, Cardiac Doppler and Limited Color Doppler Indications:    Chest Pain R07.9  History:        Patient has prior history of Echocardiogram examinations, most                 recent 05/30/2020. Previous Myocardial Infarction and CAD, Prior                 CABG; Risk Factors:Hypertension, Diabetes and Dyslipidemia. ACS                 (acute coronary syndrome) (HCC).  Sonographer:    Celesta GentileBernard White RCS Referring Phys: [id number] RONDELL A SMITH IMPRESSIONS  1. Left ventricular ejection fraction, by estimation, is 45 to 50%. The left ventricle has mildly decreased function. Left ventricular endocardial border not optimally defined to evaluate regional wall motion - regional wall motion abnormalities are seen. There is severe left ventricular hypertrophy.  2. Right ventricular systolic function is mildly reduced. The right ventricular size is normal. There is severely elevated pulmonary artery systolic pressure. The estimated right ventricular systolic pressure is 61.5 mmHg.  3. The mitral valve is grossly normal. Mild to moderate mitral valve regurgitation.  4. The aortic valve is tricuspid. There is mild calcification of  the aortic valve. Aortic valve regurgitation is trivial.  5. The inferior vena cava is dilated in size with <50%  respiratory variability, suggesting right atrial pressure of 15 mmHg. Comparison(s): A prior study was performed on 05/30/20. Prior images reviewed side by side. Regional wall motion abnormalities have not significantly changed, however LVEF appears less today, with apical function appearing less vigorous. FINDINGS  Left Ventricle: Left ventricular ejection fraction, by estimation, is 45 to 50%. The left ventricle has mildly decreased function. Left ventricular endocardial border not optimally defined to evaluate regional wall motion. There is severe left ventricular hypertrophy. Left ventricular diastolic function could not be evaluated.  LV Wall Scoring: The inferior wall, basal anteroseptal segment, mid inferoseptal segment, and basal inferoseptal segment are hypokinetic. Right Ventricle: The right ventricular size is normal. Right ventricular systolic function is mildly reduced. There is severely elevated pulmonary artery systolic pressure. The tricuspid regurgitant velocity is 3.41 m/s, and with an assumed right atrial pressure of 15 mmHg, the estimated right ventricular systolic pressure is 61.5 mmHg. Mitral Valve: The mitral valve is grossly normal. Mild to moderate mitral valve regurgitation. Tricuspid Valve: The tricuspid valve is normal in structure. Tricuspid valve regurgitation is mild. Aortic Valve: The aortic valve is tricuspid. There is mild calcification of the aortic valve. Aortic valve regurgitation is trivial. Venous: The inferior vena cava is dilated in size with less than 50% respiratory variability, suggesting right atrial pressure of 15 mmHg. LEFT VENTRICLE PLAX 2D LVIDd:         5.40 cm LVIDs:         3.85 cm LV PW:         1.70 cm LV IVS:        1.30 cm LVOT diam:     2.20 cm LVOT Area:     3.80 cm  RIGHT VENTRICLE TAPSE (M-mode): 1.6 cm LEFT ATRIUM         Index LA diam:    4.90  cm 2.15 cm/m   AORTA Ao Root diam: 4.60 cm MITRAL VALVE                TRICUSPID VALVE MV Area (PHT): 5.13 cm     TR Peak grad:   46.5 mmHg MV Decel Time: 148 msec     TR Vmax:        341.00 cm/s MR Peak grad: 149.3 mmHg MR Mean grad: 114.0 mmHg    SHUNTS MR Vmax:      611.00 cm/s   Systemic Diam: 2.20 cm MR Vmean:     517.0 cm/s MV E velocity: 108.00 cm/s MV A velocity: 37.70 cm/s MV E/A ratio:  2.86 Weston Brass MD Electronically signed by Weston Brass MD Signature Date/Time: 09/03/2020/5:42:31 PM    Final      Scheduled Meds:  amLODipine  5 mg Oral Daily   aspirin EC  81 mg Oral Daily   atorvastatin  80 mg Oral Daily   carvedilol  25 mg Oral BID WC   clopidogrel  75 mg Oral Daily   enoxaparin (LOVENOX) injection  40 mg Subcutaneous Q24H   hydrALAZINE  50 mg Oral BID   sodium chloride flush  3 mL Intravenous Q12H   Continuous Infusions:  sodium chloride     sodium chloride Stopped (09/04/20 1341)     LOS: 0 days    Time spent: 35 mins    Nealy Karapetian, MD [facility]   If 7PM-7AM, please contact night-coverage

## 2020-09-04 NOTE — Plan of Care (Signed)

## 2020-09-04 NOTE — Progress Notes (Addendum)
Progress Note  Patient Name: Darrell [name] Date of Encounter: 09/04/2020  [facility] Cardiologist: [name], MD   Subjective   No recurrent chest pain since admission.  Creatinine slightly better but still significantly off from his baseline.  He does not want to do cardiac catheterization but agrees for stress test.  No shortness of breath.  He is on contact precaution for possible bedbug which likely has cleared in ER.  Inpatient Medications    Scheduled Meds:  amLODipine  5 mg Oral Daily   aspirin EC  81 mg Oral Daily   atorvastatin  80 mg Oral Daily   carvedilol  25 mg Oral BID WC   clopidogrel  75 mg Oral Daily   enoxaparin (LOVENOX) injection  40 mg Subcutaneous Q24H   hydrALAZINE  50 mg Oral BID   sodium chloride flush  3 mL Intravenous Q12H   Continuous Infusions:  sodium chloride     sodium chloride 1 mL/kg/hr (09/04/20 0519)   PRN Meds: sodium chloride, acetaminophen, hydrALAZINE, nitroGLYCERIN, ondansetron (ZOFRAN) IV, sodium chloride flush   Vital Signs    Vitals:   09/03/20 2137 09/04/20 0006 09/04/20 0520 09/04/20 0521  BP: (!) 160/88 128/72 (!) 159/97   Pulse:  70 75 76  Resp:  20 20 20   Temp:  98.3 F (36.8 C) 98.1 F (36.7 C)   TempSrc:  Oral Oral   SpO2:  97% 97% 99%  Weight:   103.9 kg   Height:        Intake/Output Summary (Last 24 hours) at 09/04/2020 11/04/2020 Last data filed at 09/03/2020 2100 Gross per 24 hour  Intake --  Output 550 ml  Net -550 ml   Last 3 Weights 09/04/2020 09/03/2020 05/29/2020  Weight (lbs) 229 lb 0.9 oz 229 lb 4.5 oz 227 lb 4.7 oz  Weight (kg) 103.9 kg 104 kg 103.1 kg      Telemetry    Sinus rhythm, SVT- Personally Reviewed  ECG    No new tracing  Physical Exam   GEN: No acute distress.   Neck: No JVD Cardiac: RRR, no murmurs, rubs, or gallops.  Respiratory: Clear to auscultation bilaterally. GI: Soft, nontender, non-distended  MS: No edema; No deformity. Neuro:  Nonfocal  Psych: Normal affect    Labs    High Sensitivity Troponin:   Recent Labs  Lab 09/03/20 0334 09/03/20 0529 09/03/20 1525 09/03/20 1738  TROPONINIHS 130* 340* 1,371* 1,687*      Chemistry Recent Labs  Lab 09/03/20 0334 09/04/20 0059  NA 137 136  K 4.6 4.4  CL 106 108  CO2 21* 22  GLUCOSE 178* 114*  BUN 32* 33*  CREATININE 1.94* 1.89*  CALCIUM 8.6* 8.4*  PROT  --  6.0*  ALBUMIN  --  2.7*  AST  --  20  ALT  --  17  ALKPHOS  --  62  BILITOT  --  0.5  GFRNONAA 38* 39*  ANIONGAP 10 6     Hematology Recent Labs  Lab 09/03/20 0334 09/04/20 0059  WBC 8.2 5.4  RBC 3.98* 3.34*  HGB 9.5* 7.8*  HCT 32.2* 26.9*  MCV 80.9 80.5  MCH 23.9* 23.4*  MCHC 29.5* 29.0*  RDW 14.9 15.1  PLT 241 186    BNP Recent Labs  Lab 09/03/20 1525  BNP 1,578.3*     DDimer No results for input(s): DDIMER in the last 168 hours.   Radiology    DG Chest [facility] 1 View  Result  Date: 09/03/2020 CLINICAL DATA:  Central chest pain beginning on Saturday EXAM: PORTABLE CHEST 1 VIEW COMPARISON:  05/29/2020 FINDINGS: Cardiomegaly. CABG. There is no edema, consolidation, effusion, or pneumothorax. Unremarkable sternal wires IMPRESSION: No evidence of acute disease. Cardiomegaly. Electronically Signed   By: Marnee SpringJonathon  Watts M.D.   On: 09/03/2020 04:54   ECHOCARDIOGRAM LIMITED  Result Date: 09/03/2020    ECHOCARDIOGRAM LIMITED REPORT   Patient Name:   Darrell GunningSTEVEN S [name] Date of Exam: 09/03/2020 Medical Rec #:  [id number]         Height:       73.0 in Accession #:    [id number]        Weight:       229.3 lb Date of Birth:  1955/01/31         BSA:          2.280 m Patient Age:    65 years          BP:           172/83 mmHg Patient Gender: M                 HR:           87 bpm. Exam Location:  Inpatient Procedure: Limited Echo, Cardiac Doppler and Limited Color Doppler Indications:    Chest Pain R07.9  History:        Patient has prior history of Echocardiogram examinations, most                 recent 05/30/2020. Previous Myocardial  Infarction and CAD, Prior                 CABG; Risk Factors:Hypertension, Diabetes and Dyslipidemia. ACS                 (acute coronary syndrome) (HCC).  Sonographer:    Celesta GentileBernard White RCS Referring Phys: [id number] RONDELL A SMITH IMPRESSIONS  1. Left ventricular ejection fraction, by estimation, is 45 to 50%. The left ventricle has mildly decreased function. Left ventricular endocardial border not optimally defined to evaluate regional wall motion - regional wall motion abnormalities are seen. There is severe left ventricular hypertrophy.  2. Right ventricular systolic function is mildly reduced. The right ventricular size is normal. There is severely elevated pulmonary artery systolic pressure. The estimated right ventricular systolic pressure is 61.5 mmHg.  3. The mitral valve is grossly normal. Mild to moderate mitral valve regurgitation.  4. The aortic valve is tricuspid. There is mild calcification of the aortic valve. Aortic valve regurgitation is trivial.  5. The inferior vena cava is dilated in size with <50% respiratory variability, suggesting right atrial pressure of 15 mmHg. Comparison(s): A prior study was performed on 05/30/20. Prior images reviewed side by side. Regional wall motion abnormalities have not significantly changed, however LVEF appears less today, with apical function appearing less vigorous. FINDINGS  Left Ventricle: Left ventricular ejection fraction, by estimation, is 45 to 50%. The left ventricle has mildly decreased function. Left ventricular endocardial border not optimally defined to evaluate regional wall motion. There is severe left ventricular hypertrophy. Left ventricular diastolic function could not be evaluated.  LV Wall Scoring: The inferior wall, basal anteroseptal segment, mid inferoseptal segment, and basal inferoseptal segment are hypokinetic. Right Ventricle: The right ventricular size is normal. Right ventricular systolic function is mildly reduced. There is severely  elevated pulmonary artery systolic pressure. The tricuspid regurgitant velocity is 3.41 m/s, and with an assumed right atrial pressure of  15 mmHg, the estimated right ventricular systolic pressure is 61.5 mmHg. Mitral Valve: The mitral valve is grossly normal. Mild to moderate mitral valve regurgitation. Tricuspid Valve: The tricuspid valve is normal in structure. Tricuspid valve regurgitation is mild. Aortic Valve: The aortic valve is tricuspid. There is mild calcification of the aortic valve. Aortic valve regurgitation is trivial. Venous: The inferior vena cava is dilated in size with less than 50% respiratory variability, suggesting right atrial pressure of 15 mmHg. LEFT VENTRICLE PLAX 2D LVIDd:         5.40 cm LVIDs:         3.85 cm LV PW:         1.70 cm LV IVS:        1.30 cm LVOT diam:     2.20 cm LVOT Area:     3.80 cm  RIGHT VENTRICLE TAPSE (M-mode): 1.6 cm LEFT ATRIUM         Index LA diam:    4.90 cm 2.15 cm/m   AORTA Ao Root diam: 4.60 cm MITRAL VALVE                TRICUSPID VALVE MV Area (PHT): 5.13 cm     TR Peak grad:   46.5 mmHg MV Decel Time: 148 msec     TR Vmax:        341.00 cm/s MR Peak grad: 149.3 mmHg MR Mean grad: 114.0 mmHg    SHUNTS MR Vmax:      611.00 cm/s   Systemic Diam: 2.20 cm MR Vmean:     517.0 cm/s MV E velocity: 108.00 cm/s MV A velocity: 37.70 cm/s MV E/A ratio:  2.86 Weston Brass MD Electronically signed by Weston Brass MD Signature Date/Time: 09/03/2020/5:42:31 PM    Final     Cardiac Studies   Echocardiogram 09/03/20 1. Left ventricular ejection fraction, by estimation, is 45 to 50%. The  left ventricle has mildly decreased function. Left ventricular endocardial  border not optimally defined to evaluate regional wall motion - regional  wall motion abnormalities are  seen. There is severe left ventricular hypertrophy.   2. Right ventricular systolic function is mildly reduced. The right  ventricular size is normal. There is severely elevated pulmonary artery   systolic pressure. The estimated right ventricular systolic pressure is  61.5 mmHg.   3. The mitral valve is grossly normal. Mild to moderate mitral valve  regurgitation.   4. The aortic valve is tricuspid. There is mild calcification of the  aortic valve. Aortic valve regurgitation is trivial.   5. The inferior vena cava is dilated in size with <50% respiratory  variability, suggesting right atrial pressure of 15 mmHg.   Comparison(s): A prior study was performed on 05/30/20. Prior images  reviewed side by side. Regional wall motion abnormalities have not  significantly changed, however LVEF appears less today, with apical  function appearing less vigorous.   Patient Profile     65 y.o. male with a hx of HTN, HLD, CAD s/p CABG 2009 and PCI 2017, HFpEF, and CKD stage III, med noncompliance, who is being seen for the evaluation of chest pain and elevated troponin, at the request of Dr Katrinka Blazing.  Assessment & Plan    Non-STEMI in setting of hypertensive urgency and noncompliance -High-sensitivity troponin peaked at 1687.  EKG without acute changes. -Last cath in 2020 showed multiple areas of ischemia in distal vessels.  -Patient was never placed on IV heparin -Plan was for cardiac catheterization this morning.  Kidney function minimally improved but still significantly up from his baseline.  Patient does not want cardiac catheterization but agrees for stress test. -Echocardiogram showed mildly reduced LV function at 45 to 50% (was 55 to 60% 2022) with regional wall motion abnormalities have not significantly changed, however LVEF appears less today, with apical  function appearing less vigorous.    Shared Decision Making/Informed Consent The risks [chest pain, shortness of breath, cardiac arrhythmias, dizziness, blood pressure fluctuations, myocardial infarction, stroke/transient ischemic attack, nausea, vomiting, allergic reaction, radiation exposure, metallic taste sensation and  life-threatening complications (estimated to be 1 in 10,000)], benefits (risk stratification, diagnosing coronary artery disease, treatment guidance) and alternatives of a nuclear stress test were discussed in detail with Mr. Buxton and he agrees to proceed.   - Continue ASA, Plavix, statin, Coreg   2. Acute on CKD III - Baseline Scr 1.4-1.5 - SCr this admission 1.94>>1.89 - BNP ~1500 but not volume overloaded on exam.  Avoid nephrotoxic agent.  Getting fluids currently.  Will review with MD.  Cath has been canceled.   3.  Hypertensive urgency -Blood pressure improving since restarting antihypertensive  4.  Hyperlipidemia -09/03/2020: Cholesterol 167; HDL 38; LDL Cholesterol 120; Triglycerides 43; VLDL 9  -Continue Lipitor 80 mg daily -LDL goal less than 70   5.  Anemia -Hemoglobin down to 7.8 today from 9.5 yesterday. -Denies bleeding, anemia or started on heparin -Work-up per primary team   Dr. Aundra Dubin to see   For questions or updates, please contact [facility] Please consult [email or web address] for contact info under        Signed, Manson Passey, PA  09/04/2020, 8:22 AM     I have personally seen and examined this patient. I agree with the assessment and plan as outlined above.  He has diffuse 3V CAD s/p CABG. Last cath in 2020 and the proximal Circumflex in stent restenosis was treated with cutting balloon angioplasty. He has two patent grafts but overall diffuse native vessel disease in the target vessels. Admitted with hypertensive urgency (stopped his meds) and found to have elevated troponin and mild reduction LVEF. Only has chest pain when BP is high. Cardiac cath was discussed yesterday and planned for today by [name]. The patient refuses to have a cath. I would probably not cath him today anyway given his renal failure and anemia. I suspect that his elevated troponin is due to demand ischemia with HTN urgency with known severe underlying CAD. He agrees to a  stress test but I anticipate that there will be multiple defects. He understands that he needs to take his medications at home but frequently stops his meds.   Verne Carrow 09/04/2020 9:27 AM

## 2020-09-05 LAB — CBC
HCT: 28.3 % — ABNORMAL LOW (ref 39.0–52.0)
Hemoglobin: 8.2 g/dL — ABNORMAL LOW (ref 13.0–17.0)
MCH: 23.2 pg — ABNORMAL LOW (ref 26.0–34.0)
MCHC: 29 g/dL — ABNORMAL LOW (ref 30.0–36.0)
MCV: 79.9 fL — ABNORMAL LOW (ref 80.0–100.0)
Platelets: 189 10*3/uL (ref 150–400)
RBC: 3.54 MIL/uL — ABNORMAL LOW (ref 4.22–5.81)
RDW: 15.3 % (ref 11.5–15.5)
WBC: 6.3 10*3/uL (ref 4.0–10.5)
nRBC: 0 % (ref 0.0–0.2)

## 2020-09-05 LAB — BASIC METABOLIC PANEL
Anion gap: 4 — ABNORMAL LOW (ref 5–15)
BUN: 32 mg/dL — ABNORMAL HIGH (ref 8–23)
CO2: 23 mmol/L (ref 22–32)
Calcium: 8.2 mg/dL — ABNORMAL LOW (ref 8.9–10.3)
Chloride: 106 mmol/L (ref 98–111)
Creatinine, Ser: 1.77 mg/dL — ABNORMAL HIGH (ref 0.61–1.24)
GFR, Estimated: 42 mL/min — ABNORMAL LOW (ref >60)
Glucose, Bld: 109 mg/dL — ABNORMAL HIGH (ref 70–99)
Potassium: 4.3 mmol/L (ref 3.5–5.1)
Sodium: 133 mmol/L — ABNORMAL LOW (ref 135–145)

## 2020-09-05 MED ORDER — AMLODIPINE BESYLATE 10 MG PO TABS
10.0000 mg | ORAL_TABLET | Freq: Every day | ORAL | Status: DC
  Administered 2020-09-05 – 2020-09-06 (×2): 10 mg via ORAL
  Filled 2020-09-05 (×2): qty 1

## 2020-09-05 NOTE — Plan of Care (Signed)

## 2020-09-05 NOTE — Progress Notes (Signed)
CSW received consult for transportation needs for patient. CSW spoke with patient and provided patient with SCAT application. Patient accepted. All questions answered.

## 2020-09-05 NOTE — Progress Notes (Addendum)
Progress Note  Patient Name: Darrell [name] Date of Encounter: 09/05/2020  [facility] Cardiologist: [name], MD   Subjective   No chest pain this morning. Adamant he does not wish to undergo cardiac cath, Only wants to be treated with medications.    Inpatient Medications    Scheduled Meds:  amLODipine  5 mg Oral Daily   aspirin EC  81 mg Oral Daily   atorvastatin  80 mg Oral Daily   carvedilol  25 mg Oral BID WC   clopidogrel  75 mg Oral Daily   enoxaparin (LOVENOX) injection  40 mg Subcutaneous Q24H   hydrALAZINE  50 mg Oral BID   sodium chloride flush  3 mL Intravenous Q12H   Continuous Infusions:  sodium chloride     PRN Meds: sodium chloride, acetaminophen, hydrALAZINE, nitroGLYCERIN, ondansetron (ZOFRAN) IV, sodium chloride flush   Vital Signs    Vitals:   09/04/20 1335 09/04/20 1458 09/04/20 2113 09/05/20 0509  BP: (!) 180/93 (!) 158/80  (!) 161/97  Pulse:    76  Resp:    (!) 23  Temp:   99.1 F (37.3 C) 98.4 F (36.9 C)  TempSrc:    Oral  SpO2:    100%  Weight:    103.3 kg  Height:        Intake/Output Summary (Last 24 hours) at 09/05/2020 0915 Last data filed at 09/05/2020 0500 Gross per 24 hour  Intake 120 ml  Output 1150 ml  Net -1030 ml   Last 3 Weights 09/05/2020 09/04/2020 09/03/2020  Weight (lbs) 227 lb 11.8 oz 229 lb 0.9 oz 229 lb 4.5 oz  Weight (kg) 103.3 kg 103.9 kg 104 kg      Telemetry    SR - Personally Reviewed  ECG    No new tracing  Physical Exam   GEN: No acute distress.   Neck: No JVD Cardiac: RRR, no murmurs, rubs, or gallops.  Respiratory: Clear to auscultation bilaterally. GI: Soft, nontender, non-distended  MS: No edema; No deformity. Neuro:  Nonfocal  Psych: Normal affect   Labs    High Sensitivity Troponin:   Recent Labs  Lab 09/03/20 0334 09/03/20 0529 09/03/20 1525 09/03/20 1738  TROPONINIHS 130* 340* 1,371* 1,687*      Chemistry Recent Labs  Lab 09/04/20 0059 09/04/20 1354  09/05/20 0129  NA 136 137 133*  K 4.4 4.5 4.3  CL 108 110 106  CO2 22 20* 23  GLUCOSE 114* 128* 109*  BUN 33* 31* 32*  CREATININE 1.89* 1.67* 1.77*  CALCIUM 8.4* 8.4* 8.2*  PROT 6.0*  --   --   ALBUMIN 2.7*  --   --   AST 20  --   --   ALT 17  --   --   ALKPHOS 62  --   --   BILITOT 0.5  --   --   GFRNONAA 39* 45* 42*  ANIONGAP 6 7 4*     Hematology Recent Labs  Lab 09/03/20 0334 09/04/20 0059 09/04/20 1354 09/05/20 0129  WBC 8.2 5.4  --  6.3  RBC 3.98* 3.34*  --  3.54*  HGB 9.5* 7.8* 8.5* 8.2*  HCT 32.2* 26.9* 29.2* 28.3*  MCV 80.9 80.5  --  79.9*  MCH 23.9* 23.4*  --  23.2*  MCHC 29.5* 29.0*  --  29.0*  RDW 14.9 15.1  --  15.3  PLT 241 186  --  189    BNP Recent Labs  Lab 09/03/20  1525  BNP 1,578.3*     DDimer No results for input(s): DDIMER in the last 168 hours.   Radiology    NM Myocar Multi W/Spect W/Wall Motion / EF  Result Date: 09/04/2020 CLINICAL DATA:  65 year old male with a history of coronary artery disease EXAM: MYOCARDIAL IMAGING WITH SPECT (REST AND PHARMACOLOGIC-STRESS) GATED LEFT VENTRICULAR WALL MOTION STUDY LEFT VENTRICULAR EJECTION FRACTION TECHNIQUE: Standard myocardial SPECT imaging was performed after resting intravenous injection of 11.0 mCi Tc-38m tetrofosmin. Subsequently, intravenous infusion of Lexiscan was performed under the supervision of the Cardiology staff. At peak effect of the drug, 33 mCi Tc-94m tetrofosmin was injected intravenously and standard myocardial SPECT imaging was performed. Quantitative gated imaging was also performed to evaluate left ventricular wall motion, and estimate left ventricular ejection fraction. COMPARISON:  None. FINDINGS: Perfusion: Fixed defect at the anterior apex, small size moderate severity. There is questionable reversible perfusion defect at this location extending into the anterior wall. Wall Motion: Global hypokinesia Left Ventricular Ejection Fraction: 31 % End diastolic volume 201 ml End  systolic volume 138 ml IMPRESSION: 1. Fixed defect at the anterior apex of small size moderate severity, with questionable peri-infarct ischemia on the stress images. 2. Global hypokinesia. 3. Left ventricular ejection fraction 31% 4. Non invasive risk stratification*: High *2012 Appropriate Use Criteria for Coronary Revascularization Focused Update: J Am Coll Cardiol. 2012;59(9):857-881. http://content.dementiazones.com.aspx?articleid=1201161 Electronically Signed   By: Gilmer Mor D.O.   On: 09/04/2020 16:40   ECHOCARDIOGRAM LIMITED  Result Date: 09/03/2020    ECHOCARDIOGRAM LIMITED REPORT   Patient Name:   Darrell [name] Date of Exam: 09/03/2020 Medical Rec #:  [id number]         Height:       73.0 in Accession #:    [id number]        Weight:       229.3 lb Date of Birth:  08-11-1955         BSA:          2.280 m Patient Age:    65 years          BP:           172/83 mmHg Patient Gender: M                 HR:           87 bpm. Exam Location:  Inpatient Procedure: Limited Echo, Cardiac Doppler and Limited Color Doppler Indications:    Chest Pain R07.9  History:        Patient has prior history of Echocardiogram examinations, most                 recent 05/30/2020. Previous Myocardial Infarction and CAD, Prior                 CABG; Risk Factors:Hypertension, Diabetes and Dyslipidemia. ACS                 (acute coronary syndrome) (HCC).  Sonographer:    Celesta Gentile RCS Referring Phys: [id number] Darrell [name] IMPRESSIONS  1. Left ventricular ejection fraction, by estimation, is 45 to 50%. The left ventricle has mildly decreased function. Left ventricular endocardial border not optimally defined to evaluate regional wall motion - regional wall motion abnormalities are seen. There is severe left ventricular hypertrophy.  2. Right ventricular systolic function is mildly reduced. The right ventricular size is normal. There is severely elevated pulmonary artery systolic pressure. The estimated right ventricular  systolic pressure  is 61.5 mmHg.  3. The mitral valve is grossly normal. Mild to moderate mitral valve regurgitation.  4. The aortic valve is tricuspid. There is mild calcification of the aortic valve. Aortic valve regurgitation is trivial.  5. The inferior vena cava is dilated in size with <50% respiratory variability, suggesting right atrial pressure of 15 mmHg. Comparison(s): A prior study was performed on 05/30/20. Prior images reviewed side by side. Regional wall motion abnormalities have not significantly changed, however LVEF appears less today, with apical function appearing less vigorous. FINDINGS  Left Ventricle: Left ventricular ejection fraction, by estimation, is 45 to 50%. The left ventricle has mildly decreased function. Left ventricular endocardial border not optimally defined to evaluate regional wall motion. There is severe left ventricular hypertrophy. Left ventricular diastolic function could not be evaluated.  LV Wall Scoring: The inferior wall, basal anteroseptal segment, mid inferoseptal segment, and basal inferoseptal segment are hypokinetic. Right Ventricle: The right ventricular size is normal. Right ventricular systolic function is mildly reduced. There is severely elevated pulmonary artery systolic pressure. The tricuspid regurgitant velocity is 3.41 m/s, and with an assumed right atrial pressure of 15 mmHg, the estimated right ventricular systolic pressure is 61.5 mmHg. Mitral Valve: The mitral valve is grossly normal. Mild to moderate mitral valve regurgitation. Tricuspid Valve: The tricuspid valve is normal in structure. Tricuspid valve regurgitation is mild. Aortic Valve: The aortic valve is tricuspid. There is mild calcification of the aortic valve. Aortic valve regurgitation is trivial. Venous: The inferior vena cava is dilated in size with less than 50% respiratory variability, suggesting right atrial pressure of 15 mmHg. LEFT VENTRICLE PLAX 2D LVIDd:         5.40 cm LVIDs:          3.85 cm LV PW:         1.70 cm LV IVS:        1.30 cm LVOT diam:     2.20 cm LVOT Area:     3.80 cm  RIGHT VENTRICLE TAPSE (M-mode): 1.6 cm LEFT ATRIUM         Index LA diam:    4.90 cm 2.15 cm/m   AORTA Ao Root diam: 4.60 cm MITRAL VALVE                TRICUSPID VALVE MV Area (PHT): 5.13 cm     TR Peak grad:   46.5 mmHg MV Decel Time: 148 msec     TR Vmax:        341.00 cm/s MR Peak grad: 149.3 mmHg MR Mean grad: 114.0 mmHg    SHUNTS MR Vmax:      611.00 cm/s   Systemic Diam: 2.20 cm MR Vmean:     517.0 cm/s MV E velocity: 108.00 cm/s MV A velocity: 37.70 cm/s MV E/A ratio:  2.86 Weston BrassGayatri Acharya MD Electronically signed by Weston BrassGayatri Acharya MD Signature Date/Time: 09/03/2020/5:42:31 PM    Final     Cardiac Studies   Lexiscan: 09/04/20  IMPRESSION: 1. Fixed defect at the anterior apex of small size moderate severity, with questionable peri-infarct ischemia on the stress images.   2. Global hypokinesia.   3. Left ventricular ejection fraction 31%   4. Non invasive risk stratification*: High   *2012 Appropriate Use Criteria for Coronary Revascularization Focused Update: J Am Coll Cardiol. 2012;59(9):857-881. http://content.dementiazones.comonlinejacc.org/article.aspx?articleid=1201161     Electronically Signed   By: Gilmer MorJaime  Wagner D.O.   On: 09/04/2020 16:40  Patient Profile     65 y.o. male with  a hx of HTN, HLD, CAD s/p CABG 2009 and PCI 2017, HFpEF, and CKD stage III, med noncompliance, who is being seen for the evaluation of chest pain and elevated troponin, at the request of Dr Katrinka Blazing.  Assessment & Plan    NSTEMI: hsTn peaked at 1687. Refused cath on admission. Had lexiscan myoview with peri-infarct ischemia in the anterior apex. He is still adamantly refusing cath. Given his non-compliance hx would be concerned committing him to DAP with stenting as well. He denies chest pain this morning.  -- on asa, plavix, statin, coreg -- will give diet  CKD III: baseline Cr 1.4-1.5, up to 1.94>>1.89>>1.77 --  receiving IVFs -- no room for ACEi/[name]  HTN: blood pressures are not well controlled -- will further increase norvasc to 10mg ,  continue coreg 25mg  BID, hydralazine 50mg  TID  HLD: LDL 120 -- on statin  Anemia: Hgb has hovered around 9 since May. Dropped to 7.8>>8.5>>8.2 this morning -- denies any overt bleeding --check anemia panel  Noncompliance: long hx of the same. Son helps with things, picks up meds. He has not keep any of his follow up appts arranged in the office. Historically runs out of meds, and does not seek out refills.   For questions or updates, please contact [facility] Please consult [email or web address] for contact info under        Signed, , NP  09/05/2020, 9:15 AM    I have personally seen and examined this patient. I agree with the assessment and plan as outlined above. Admitted with NSTEMI. He refused cath on admission. Nuclear stress test is read as high risk due to his LV dysfunction but overall no large areas of ischemia. We discussed cardiac cath again and he only wants medical therapy. Continue ASA, Plavix, statin, norvasc, Coreg. Titrate meds for better BP control  Cardiology will sign off. Please call with questions  June, MD, [facility] 09/05/2020 10:06 AM

## 2020-09-05 NOTE — Progress Notes (Addendum)
PROGRESS NOTE    Darrell [name]  WUJ:[id number] DOB: March 23, 1955 DOA: 09/03/2020 PCP: Hoy Register, MD    Brief Narrative:  This 65 years old male with PMH significant for hypertension, hyperlipidemia, CAD s/p CABG and PCI, HFpEF, and CKD stage III presented in the ED with complaint of chest pain started yesterday night.  Patient describes chest pain as localized, sharp, does not radiate.  He denies any fall or trauma or chest injury.  Patient reports taking 4 aspirin without any resolution of chest pain. In the ED patient was found to have chest pain and troponin significantly trending up associated with hypertension.  Cardiology consulted recommended left heart cath.  Patient refused left heart cath and agreed for stress test which came back high risk.  Patient again was recommended to have a left heart cath by cardiology but patient again refused. he wants to manage medically.  Cardiology recommended optimize medical management.  Cardiology signed off.   Assessment & Plan:   Active Problems:   Type 2 diabetes mellitus without complication, without long-term current use of insulin (HCC)   HYPERCHOLESTEROLEMIA   Coronary atherosclerosis, s/p CABG in 2009    Elevated troponin   Hypertensive emergency   Chest pain   Acute kidney injury superimposed on chronic kidney disease (HCC)   Hyperlipidemia with target LDL less than 70   Chest pain: Patient presented with chest pain with elevated troponins with significant history of CAD with prior CABG and PCI. Last left heart cath in 04/2018 with restenosis of 99% of the Cx and severe diffuse disease of the LAD requiring PCI. Troponin trending up 130> [phone] Continue telemetry. Cardiology consulted recommended left heart cath but patient declined. Continue aspirin, Plavix and statin. Patient has agreed for nuclear stress test which came back as high risk. Cardiology again recommended patient to have a left heart cath but patient has  declined.   He wants to manage medically.  Optimize medical management.   Hypertensive emergency : Blood pressure on arrival 249/137.   Resume home blood pressure medications.  Amlodipine  and hydralazine Hydralazine IV as needed for systolic blood pressure greater than 180. Blood pressure still remains elevated,  amlodipine increased.  AKI on CKD stage IIIa: Patient presented with serum creatinine 1.94,  Baseline serum creatinine 1.4-1.5 Avoid nephrotoxic medications, continue monitor renal functions. Renal Functions trending down.  Heart failure with preserved EF: Patient does not appear fluid overloaded on exam. Chest x-ray no cardiomegaly. Last echocardiogram LVEF 55 to 60% grade 1 diastolic dysfunction. Monitor daily weight ,  intake with output charting  Hypochromic anemia: Patient denies any blood in the stools,  no signs of any acute blood loss.   Continue to monitor H&H.     Diabetes mellitus type 2:   Carb modified diet.  Regular insulin sliding scale.   Hyperlipidemia: Continue atorvastatin 80 mg daily.   DVT prophylaxis: Lovenox Code Status: Full code Family Communication: No family at bedside Disposition Plan:   Status is: Inpatient  Remains inpatient appropriate because:Inpatient level of care appropriate due to severity of illness  Dispo: The patient is from: Home              Anticipated d/c is to: Home  09/06/20.              Patient currently is not medically stable to d/c.   Difficult to place patient No   Consultants:  Cardiology  Procedures:  Nuclear stress test. Antimicrobials:   Anti-infectives (From admission, onward)  None        Subjective: Patient was seen and examined at bedside.  Overnight events noted.   Patient reports chest pain has resolved.  He looks comfortable. He was explained in detail about abnormal stress test that he needs left heart cath.   He is still refusing it .  He wants to manage it  medically.  Objective: Vitals:   09/04/20 1458 09/04/20 2113 09/05/20 0509 09/05/20 1320  BP: (!) 158/80  (!) 161/97 (!) 147/86  Pulse:   76 73  Resp:   (!) 23 20  Temp:  99.1 F (37.3 C) 98.4 F (36.9 C) (!) 97.5 F (36.4 C)  TempSrc:   Oral Oral  SpO2:   100%   Weight:   103.3 kg   Height:        Intake/Output Summary (Last 24 hours) at 09/05/2020 1533 Last data filed at 09/05/2020 1321 Gross per 24 hour  Intake 600 ml  Output 1100 ml  Net -500 ml   Filed Weights   09/03/20 1333 09/04/20 0520 09/05/20 0509  Weight: 104 kg 103.9 kg 103.3 kg    Examination:  General exam: Appears calm and comfortable, not in any acute distress Respiratory system: Clear to auscultation. Respiratory effort normal. Cardiovascular system: S1 & S2 heard, RRR. No JVD, murmurs, rubs, gallops or clicks. No pedal edema. Gastrointestinal system: Abdomen is nondistended, soft and nontender. No organomegaly or masses felt. Normal bowel sounds heard. Central nervous system: Alert and oriented x 3. No focal neurological deficits. Extremities: No edema, no cyanosis, no clubbing. Skin: No rashes, lesions or ulcers Psychiatry: Judgement and insight appear normal. Mood & affect appropriate.     Data Reviewed: I have personally reviewed following labs and imaging studies  CBC: Recent Labs  Lab 09/03/20 0334 09/04/20 0059 09/04/20 1354 09/05/20 0129  WBC 8.2 5.4  --  6.3  HGB 9.5* 7.8* 8.5* 8.2*  HCT 32.2* 26.9* 29.2* 28.3*  MCV 80.9 80.5  --  79.9*  PLT 241 186  --  189   Basic Metabolic Panel: Recent Labs  Lab 09/03/20 0334 09/04/20 0059 09/04/20 1354 09/05/20 0129  NA 137 136 137 133*  K 4.6 4.4 4.5 4.3  CL 106 108 110 106  CO2 21* 22 20* 23  GLUCOSE 178* 114* 128* 109*  BUN 32* 33* 31* 32*  CREATININE 1.94* 1.89* 1.67* 1.77*  CALCIUM 8.6* 8.4* 8.4* 8.2*   GFR: Estimated Creatinine Clearance: 53.3 mL/min (A) (by C-G formula based on SCr of 1.77 mg/dL (H)). Liver Function  Tests: Recent Labs  Lab 09/04/20 0059  AST 20  ALT 17  ALKPHOS 62  BILITOT 0.5  PROT 6.0*  ALBUMIN 2.7*   No results for input(s): LIPASE, AMYLASE in the last 168 hours. No results for input(s): AMMONIA in the last 168 hours. Coagulation Profile: No results for input(s): INR, PROTIME in the last 168 hours. Cardiac Enzymes: No results for input(s): CKTOTAL, CKMB, CKMBINDEX, TROPONINI in the last 168 hours. BNP (last 3 results) No results for input(s): PROBNP in the last 8760 hours. HbA1C: Recent Labs    09/03/20 1525  HGBA1C 5.6   CBG: No results for input(s): GLUCAP in the last 168 hours. Lipid Profile: Recent Labs    09/03/20 0529  CHOL 167  HDL 38*  LDLCALC 120*  TRIG 43  CHOLHDL 4.4   Thyroid Function Tests: No results for input(s): TSH, T4TOTAL, FREET4, T3FREE, THYROIDAB in the last 72 hours. Anemia Panel: No results for input(s):  VITAMINB12, FOLATE, FERRITIN, TIBC, IRON, RETICCTPCT in the last 72 hours. Sepsis Labs: No results for input(s): PROCALCITON, LATICACIDVEN in the last 168 hours.  Recent Results (from the past 240 hour(s))  SARS CORONAVIRUS 2 (TAT 6-24 HRS) Nasopharyngeal Nasopharyngeal Swab     Status: None   Collection Time: 09/03/20  3:38 AM   Specimen: Nasopharyngeal Swab  Result Value Ref Range Status   SARS Coronavirus 2 NEGATIVE NEGATIVE Final    Comment: (NOTE) SARS-CoV-2 target nucleic acids are NOT DETECTED.  The SARS-CoV-2 RNA is generally detectable in upper and lower respiratory specimens during the acute phase of infection. Negative results do not preclude SARS-CoV-2 infection, do not rule out co-infections with other pathogens, and should not be used as the sole basis for treatment or other patient management decisions. Negative results must be combined with clinical observations, patient history, and epidemiological information. The expected result is Negative.  Fact Sheet for  Patients: HairSlick.no  Fact Sheet for Healthcare Providers: quierodirigir.com  This test is not yet approved or cleared by the [place] FDA and  has been authorized for detection and/or diagnosis of SARS-CoV-2 by FDA under an Emergency Use Authorization (EUA). This EUA will remain  in effect (meaning this test can be used) for the duration of the COVID-19 declaration under Se ction 564(b)(1) of the Act, 21 U.S.C. section 360bbb-3(b)(1), unless the authorization is terminated or revoked sooner.  Performed at [facility], [address], [place], [place] [address]     Radiology Studies: NM Myocar Multi W/Spect W/Wall Motion / EF  Result Date: 09/04/2020 CLINICAL DATA:  65 year old male with a history of coronary artery disease EXAM: MYOCARDIAL IMAGING WITH SPECT (REST AND PHARMACOLOGIC-STRESS) GATED LEFT VENTRICULAR WALL MOTION STUDY LEFT VENTRICULAR EJECTION FRACTION TECHNIQUE: Standard myocardial SPECT imaging was performed after resting intravenous injection of 11.0 mCi Tc-66m tetrofosmin. Subsequently, intravenous infusion of Lexiscan was performed under the supervision of the Cardiology staff. At peak effect of the drug, 33 mCi Tc-65m tetrofosmin was injected intravenously and standard myocardial SPECT imaging was performed. Quantitative gated imaging was also performed to evaluate left ventricular wall motion, and estimate left ventricular ejection fraction. COMPARISON:  None. FINDINGS: Perfusion: Fixed defect at the anterior apex, small size moderate severity. There is questionable reversible perfusion defect at this location extending into the anterior wall. Wall Motion: Global hypokinesia Left Ventricular Ejection Fraction: 31 % End diastolic volume 201 ml End systolic volume 138 ml IMPRESSION: 1. Fixed defect at the anterior apex of small size moderate severity, with questionable peri-infarct ischemia on the stress images.  2. Global hypokinesia. 3. Left ventricular ejection fraction 31% 4. Non invasive risk stratification*: High *2012 Appropriate Use Criteria for Coronary Revascularization Focused Update: J Am Coll Cardiol. 2012;59(9):857-881. http://content.dementiazones.com.aspx?articleid=1201161 Electronically Signed   By: Gilmer Mor D.O.   On: 09/04/2020 16:40   ECHOCARDIOGRAM LIMITED  Result Date: 09/03/2020    ECHOCARDIOGRAM LIMITED REPORT   Patient Name:   LOCKE BARRELL Date of Exam: 09/03/2020 Medical Rec #:  [id number]         Height:       73.0 in Accession #:    [id number]        Weight:       229.3 lb Date of Birth:  30-Oct-1955         BSA:          2.280 m Patient Age:    65 years          BP:  172/83 mmHg Patient Gender: M                 HR:           87 bpm. Exam Location:  Inpatient Procedure: Limited Echo, Cardiac Doppler and Limited Color Doppler Indications:    Chest Pain R07.9  History:        Patient has prior history of Echocardiogram examinations, most                 recent 05/30/2020. Previous Myocardial Infarction and CAD, Prior                 CABG; Risk Factors:Hypertension, Diabetes and Dyslipidemia. ACS                 (acute coronary syndrome) (HCC).  Sonographer:    Celesta Gentile RCS Referring Phys: [id number] RONDELL A SMITH IMPRESSIONS  1. Left ventricular ejection fraction, by estimation, is 45 to 50%. The left ventricle has mildly decreased function. Left ventricular endocardial border not optimally defined to evaluate regional wall motion - regional wall motion abnormalities are seen. There is severe left ventricular hypertrophy.  2. Right ventricular systolic function is mildly reduced. The right ventricular size is normal. There is severely elevated pulmonary artery systolic pressure. The estimated right ventricular systolic pressure is 61.5 mmHg.  3. The mitral valve is grossly normal. Mild to moderate mitral valve regurgitation.  4. The aortic valve is tricuspid. There is mild  calcification of the aortic valve. Aortic valve regurgitation is trivial.  5. The inferior vena cava is dilated in size with <50% respiratory variability, suggesting right atrial pressure of 15 mmHg. Comparison(s): A prior study was performed on 05/30/20. Prior images reviewed side by side. Regional wall motion abnormalities have not significantly changed, however LVEF appears less today, with apical function appearing less vigorous. FINDINGS  Left Ventricle: Left ventricular ejection fraction, by estimation, is 45 to 50%. The left ventricle has mildly decreased function. Left ventricular endocardial border not optimally defined to evaluate regional wall motion. There is severe left ventricular hypertrophy. Left ventricular diastolic function could not be evaluated.  LV Wall Scoring: The inferior wall, basal anteroseptal segment, mid inferoseptal segment, and basal inferoseptal segment are hypokinetic. Right Ventricle: The right ventricular size is normal. Right ventricular systolic function is mildly reduced. There is severely elevated pulmonary artery systolic pressure. The tricuspid regurgitant velocity is 3.41 m/s, and with an assumed right atrial pressure of 15 mmHg, the estimated right ventricular systolic pressure is 61.5 mmHg. Mitral Valve: The mitral valve is grossly normal. Mild to moderate mitral valve regurgitation. Tricuspid Valve: The tricuspid valve is normal in structure. Tricuspid valve regurgitation is mild. Aortic Valve: The aortic valve is tricuspid. There is mild calcification of the aortic valve. Aortic valve regurgitation is trivial. Venous: The inferior vena cava is dilated in size with less than 50% respiratory variability, suggesting right atrial pressure of 15 mmHg. LEFT VENTRICLE PLAX 2D LVIDd:         5.40 cm LVIDs:         3.85 cm LV PW:         1.70 cm LV IVS:        1.30 cm LVOT diam:     2.20 cm LVOT Area:     3.80 cm  RIGHT VENTRICLE TAPSE (M-mode): 1.6 cm LEFT ATRIUM         Index  LA diam:    4.90 cm 2.15 cm/m   AORTA Ao  Root diam: 4.60 cm MITRAL VALVE                TRICUSPID VALVE MV Area (PHT): 5.13 cm     TR Peak grad:   46.5 mmHg MV Decel Time: 148 msec     TR Vmax:        341.00 cm/s MR Peak grad: 149.3 mmHg MR Mean grad: 114.0 mmHg    SHUNTS MR Vmax:      611.00 cm/s   Systemic Diam: 2.20 cm MR Vmean:     517.0 cm/s MV E velocity: 108.00 cm/s MV A velocity: 37.70 cm/s MV E/A ratio:  2.86 Weston BrassGayatri Acharya MD Electronically signed by Weston BrassGayatri Acharya MD Signature Date/Time: 09/03/2020/5:42:31 PM    Final      Scheduled Meds:  amLODipine  10 mg Oral Daily   aspirin EC  81 mg Oral Daily   atorvastatin  80 mg Oral Daily   carvedilol  25 mg Oral BID WC   clopidogrel  75 mg Oral Daily   enoxaparin (LOVENOX) injection  40 mg Subcutaneous Q24H   hydrALAZINE  50 mg Oral BID   sodium chloride flush  3 mL Intravenous Q12H   Continuous Infusions:  sodium chloride       LOS: 1 day    Time spent: 25 mins    Cipriano BunkerPARDEEP Ewa Hipp, MD [facility]   If 7PM-7AM, please contact night-coverage

## 2020-09-06 ENCOUNTER — Other Ambulatory Visit (HOSPITAL_COMMUNITY): Payer: Self-pay

## 2020-09-06 LAB — CBC
HCT: 28.2 % — ABNORMAL LOW (ref 39.0–52.0)
Hemoglobin: 8.2 g/dL — ABNORMAL LOW (ref 13.0–17.0)
MCH: 23.2 pg — ABNORMAL LOW (ref 26.0–34.0)
MCHC: 29.1 g/dL — ABNORMAL LOW (ref 30.0–36.0)
MCV: 79.9 fL — ABNORMAL LOW (ref 80.0–100.0)
Platelets: 209 K/uL (ref 150–400)
RBC: 3.53 MIL/uL — ABNORMAL LOW (ref 4.22–5.81)
RDW: 15.4 % (ref 11.5–15.5)
WBC: 5.7 K/uL (ref 4.0–10.5)
nRBC: 0 % (ref 0.0–0.2)

## 2020-09-06 LAB — BASIC METABOLIC PANEL
Anion gap: 7 (ref 5–15)
BUN: 33 mg/dL — ABNORMAL HIGH (ref 8–23)
CO2: 21 mmol/L — ABNORMAL LOW (ref 22–32)
Calcium: 8.3 mg/dL — ABNORMAL LOW (ref 8.9–10.3)
Chloride: 106 mmol/L (ref 98–111)
Creatinine, Ser: 1.81 mg/dL — ABNORMAL HIGH (ref 0.61–1.24)
GFR, Estimated: 41 mL/min — ABNORMAL LOW (ref >60)
Glucose, Bld: 114 mg/dL — ABNORMAL HIGH (ref 70–99)
Potassium: 4.5 mmol/L (ref 3.5–5.1)
Sodium: 134 mmol/L — ABNORMAL LOW (ref 135–145)

## 2020-09-06 LAB — VITAMIN B12: Vitamin B-12: 215 pg/mL (ref 180–914)

## 2020-09-06 LAB — FOLATE: Folate: 12.5 ng/mL

## 2020-09-06 LAB — IRON AND TIBC
Iron: 18 ug/dL — ABNORMAL LOW (ref 45–182)
Saturation Ratios: 5 % — ABNORMAL LOW (ref 17.9–39.5)
TIBC: 384 ug/dL (ref 250–450)
UIBC: 366 ug/dL

## 2020-09-06 LAB — RETICULOCYTES
Immature Retic Fract: 20.4 % — ABNORMAL HIGH (ref 2.3–15.9)
RBC.: 3.5 MIL/uL — ABNORMAL LOW (ref 4.22–5.81)
Retic Count, Absolute: 73.9 10*3/uL (ref 19.0–186.0)
Retic Ct Pct: 2.1 % (ref 0.4–3.1)

## 2020-09-06 LAB — FERRITIN: Ferritin: 7 ng/mL — ABNORMAL LOW (ref 24–336)

## 2020-09-06 MED ORDER — ASPIRIN 81 MG PO TBEC
81.0000 mg | DELAYED_RELEASE_TABLET | Freq: Every day | ORAL | 1 refills | Status: DC
  Filled 2020-09-06: qty 30, 30d supply, fill #0

## 2020-09-06 MED ORDER — ATORVASTATIN CALCIUM 80 MG PO TABS
80.0000 mg | ORAL_TABLET | Freq: Every evening | ORAL | 1 refills | Status: DC
  Filled 2020-09-06: qty 30, 30d supply, fill #0

## 2020-09-06 MED ORDER — NITROGLYCERIN 0.4 MG SL SUBL
0.4000 mg | SUBLINGUAL_TABLET | SUBLINGUAL | 1 refills | Status: DC | PRN
  Filled 2020-09-06: qty 25, 7d supply, fill #0
  Filled 2020-12-01: qty 25, 7d supply, fill #1

## 2020-09-06 MED ORDER — HYDRALAZINE HCL 50 MG PO TABS
50.0000 mg | ORAL_TABLET | Freq: Three times a day (TID) | ORAL | 1 refills | Status: DC
Start: 2020-09-06 — End: 2020-11-02
  Filled 2020-09-06: qty 90, 30d supply, fill #0

## 2020-09-06 MED ORDER — AMLODIPINE BESYLATE 10 MG PO TABS
10.0000 mg | ORAL_TABLET | Freq: Every day | ORAL | 1 refills | Status: DC
  Filled 2020-09-06: qty 30, 30d supply, fill #0

## 2020-09-06 MED ORDER — CLOPIDOGREL BISULFATE 75 MG PO TABS
75.0000 mg | ORAL_TABLET | Freq: Every day | ORAL | 1 refills | Status: DC
  Filled 2020-09-06: qty 30, 30d supply, fill #0

## 2020-09-06 MED ORDER — METFORMIN HCL 500 MG PO TABS
500.0000 mg | ORAL_TABLET | Freq: Every day | ORAL | 1 refills | Status: DC
  Filled 2020-09-06: qty 30, 30d supply, fill #0

## 2020-09-06 MED ORDER — FERROUS SULFATE 325 (65 FE) MG PO TABS
325.0000 mg | ORAL_TABLET | Freq: Two times a day (BID) | ORAL | 1 refills | Status: DC
  Filled 2020-09-06: qty 30, 15d supply, fill #0

## 2020-09-06 MED ORDER — TAMSULOSIN HCL 0.4 MG PO CAPS
0.4000 mg | ORAL_CAPSULE | Freq: Every day | ORAL | 1 refills | Status: DC
  Filled 2020-09-06: qty 30, 30d supply, fill #0

## 2020-09-06 MED ORDER — CARVEDILOL 25 MG PO TABS
25.0000 mg | ORAL_TABLET | Freq: Two times a day (BID) | ORAL | 1 refills | Status: DC
  Filled 2020-09-06: qty 60, 30d supply, fill #0

## 2020-09-06 MED ORDER — ISOSORBIDE MONONITRATE ER 60 MG PO TB24
120.0000 mg | ORAL_TABLET | Freq: Every day | ORAL | 1 refills | Status: DC
  Filled 2020-09-06: qty 60, 30d supply, fill #0

## 2020-09-06 NOTE — Discharge Summary (Signed)
Physician Discharge Summary  Darrell [name] EHO:[id number] DOB: [birth year] DOA: 09/03/2020  PCP: [name], MD  Admit date: 09/03/2020 Discharge date: 09/06/2020  Admitted From: Home Disposition:  Home  Recommendations for Outpatient Follow-up:  Follow up with PCP in 1-2 weeks Low with primary cardiologist as an outpatient. Will need work-up for iron deficiency anemia as an outpatient.  Home Health:NO  Discharge Condition:Stable CODE STATUS:FULL Diet recommendation: Heart Healthy   Brief/Interim Summary:  This 65 years old male with PMH significant for hypertension, hyperlipidemia, CAD s/p CABG and PCI, HFpEF, and CKD stage III presented in the ED with complaint of chest pain started yesterday night.  Patient describes chest pain as localized, sharp, does not radiate.  He denies any fall or trauma or chest injury.  Patient reports taking 4 aspirin without any resolution of chest pain. In the ED patient was found to have chest pain and troponin significantly trending up associated with hypertension.  Cardiology consulted recommended left heart cath.  Patient refused left heart cath and agreed for stress test which came back high risk.  Patient again was recommended to have a left heart cath by cardiology but patient again refused. he wants to manage medically.  Cardiology recommended optimize medical management.    NSTEMI Patient presented with chest pain with elevated troponins with significant history of CAD with prior CABG and PCI. Last left heart cath in 04/2018 with restenosis of 99% of the Cx and severe diffuse disease of the LAD requiring PCI. Troponin trending up 130> [id number] -Neurology input greatly appreciated, patient high-sensitivity troponins peaked at 1687, patient refused multiple offers for cardiac cath, his Lexiscan significant with peri-infarct ischemia in the anterior Epics, patient is adamant about refusing cardiac cath,. -Current recommendation is to  optimize medical management, he is on aspirin, Plavix, statin and Coreg.  Hypertensive emergency : Blood pressure on arrival 249/137.   -Since has been adjustment during hospital stay, his hydralazine has been increased, he was kept on amlodipine and Coreg, as well he was kept on his Imdur, blood pressures 127/58 on discharge, losartan/hydrochlorothiazide has been held during hospital stay, and held on discharge as patient has been on the lower side at time of discharge.      AKI on CKD stage IIIa: Patient presented with serum creatinine 1.94, Baseline serum creatinine 1.4-1.5 Avoid nephrotoxic medications, continue monitor renal functions. Continues 1.8 on discharge, patient losartan/hydrochlorothiazide has been held on discharge as well.   Heart failure with preserved EF: Patient does not appear fluid overloaded on exam. Chest x-ray no cardiomegaly. Last echocardiogram LVEF 55 to 45% grade 1 diastolic dysfunction.   Microcytic anemia: Work-up significant for iron deficiency anemia, will start on iron supplements on discharge, will need iron deficiency anemia work-up as an outpatient   Diabetes mellitus type 2:  Carb modified diet.  Kept on insulin sliding scale during hospital stay.  Hyperlipidemia: Continue atorvastatin 80 mg daily.    Discharge Diagnoses:  Active Problems:   Type 2 diabetes mellitus without complication, without long-term current use of insulin ([place])   HYPERCHOLESTEROLEMIA   Coronary atherosclerosis, s/p CABG in 2009    Elevated troponin   Hypertensive emergency   Chest pain   Acute kidney injury superimposed on chronic kidney disease ([place])   Hyperlipidemia with target LDL less than 70    Discharge Instructions  Discharge Instructions     Diet - low sodium heart healthy   Complete by: As directed    Discharge instructions   Complete by: As  directed    Follow with Primary MD [name], MD in 7 days   Get CBC, CMP,  checked  by Primary MD  next visit.    Activity: As tolerated with Full fall precautions use walker/cane & assistance as needed   Disposition Home    Diet: Heart Healthy , with feeding assistance and aspiration precautions.  For Heart failure patients - Check your Weight same time everyday, if you gain over 2 pounds, or you develop in leg swelling, experience more shortness of breath or chest pain, call your Primary MD immediately. Follow Cardiac Low Salt Diet and 1.5 lit/day fluid restriction.   On your next visit with your primary care physician please Get Medicines reviewed and adjusted.   Please request your Prim.MD to go over all Hospital Tests and Procedure/Radiological results at the follow up, please get all Hospital records sent to your Prim MD by signing hospital release before you go home.   If you experience worsening of your admission symptoms, develop shortness of breath, life threatening emergency, suicidal or homicidal thoughts you must seek medical attention immediately by calling 911 or calling your MD immediately  if symptoms less severe.  You Must read complete instructions/literature along with all the possible adverse reactions/side effects for all the Medicines you take and that have been prescribed to you. Take any new Medicines after you have completely understood and accpet all the possible adverse reactions/side effects.   Do not drive, operating heavy machinery, perform activities at heights, swimming or participation in water activities or provide baby sitting services if your were admitted for syncope or siezures until you have seen by Primary MD or a Neurologist and advised to do so again.  Do not drive when taking Pain medications.    Do not take more than prescribed Pain, Sleep and Anxiety Medications  Special Instructions: If you have smoked or chewed Tobacco  in the last 2 yrs please stop smoking, stop any regular Alcohol  and or any Recreational drug use.  Wear Seat belts  while driving.   Please note  You were cared for by a hospitalist during your hospital stay. If you have any questions about your discharge medications or the care you received while you were in the hospital after you are discharged, you can call the unit and asked to speak with the hospitalist on call if the hospitalist that took care of you is not available. Once you are discharged, your primary care physician will handle any further medical issues. Please note that NO REFILLS for any discharge medications will be authorized once you are discharged, as it is imperative that you return to your primary care physician (or establish a relationship with a primary care physician if you do not have one) for your aftercare needs so that they can reassess your need for medications and monitor your lab values.   Increase activity slowly   Complete by: As directed       Allergies as of 09/06/2020   No Known Allergies      Medication List     STOP taking these medications    accu-chek softclix lancets   aspirin 81 MG chewable tablet Replaced by: Aspirin Low Dose 81 MG EC tablet   losartan-hydrochlorothiazide 50-12.5 MG tablet Commonly known as: Hyzaar   True Metrix Blood Glucose Test test strip Generic drug: glucose blood   True Metrix Meter w/Device Kit   TRUEplus Lancets 28G Misc       TAKE these medications  amLODipine 10 MG tablet Commonly known as: NORVASC Take 1 tablet (10 mg total) by mouth daily. Please make PCP appointment.   Aspirin Low Dose 81 MG EC tablet Generic drug: aspirin Take 1 tablet (81 mg total) by mouth daily. Swallow whole. Start taking on: September 07, 2020 Replaces: aspirin 81 MG chewable tablet   atorvastatin 80 MG tablet Commonly known as: LIPITOR Take 1 tablet (80 mg total) by mouth every evening.   carvedilol 25 MG tablet Commonly known as: COREG Take 1 tablet (25 mg total) by mouth 2 (two) times daily with a meal. Please make PCP  appointment.   clopidogrel 75 MG tablet Commonly known as: PLAVIX Take 1 tablet (75 mg total) by mouth daily. Please make PCP appointment.   hydrALAZINE 50 MG tablet Commonly known as: APRESOLINE Take 1 tablet (50 mg total) by mouth 3 (three) times daily. What changed: when to take this   isosorbide mononitrate 60 MG 24 hr tablet Commonly known as: IMDUR Take 2 tablets (120 mg total) by mouth daily. Please make PCP appointment. What changed:  medication strength additional instructions Notes to patient: You have not had this medication today. You may resume today and continue to take daily.   metFORMIN 500 MG tablet Commonly known as: GLUCOPHAGE Take 1 tablet (500 mg total) by mouth daily with breakfast. What changed: how much to take   nitroGLYCERIN 0.4 MG SL tablet Commonly known as: NITROSTAT Place 1 tablet (0.4 mg total) under the tongue every 5 (five) minutes as needed for chest pain. What changed:  how much to take how to take this when to take this reasons to take this   tamsulosin 0.4 MG Caps capsule Commonly known as: FLOMAX Take 1 capsule (0.4 mg total) by mouth daily. Please make PCP appointment.        Follow-up Information     Minus Breeding, MD Follow up.   Specialty: Cardiology Why: Please call us if our office does not contact you Contact information: [address] [place] [address] [phone]                No Known Allergies  Consultations: cardiology   Procedures/Studies: NM Myocar Multi W/Spect W/Wall Motion / EF  Result Date: 09/04/2020 CLINICAL DATA:  65 year old male with a history of coronary artery disease EXAM: MYOCARDIAL IMAGING WITH SPECT (REST AND PHARMACOLOGIC-STRESS) GATED LEFT VENTRICULAR WALL MOTION STUDY LEFT VENTRICULAR EJECTION FRACTION TECHNIQUE: Standard myocardial SPECT imaging was performed after resting intravenous injection of 11.0 mCi Tc-75m tetrofosmin. Subsequently, intravenous infusion  of Lexiscan was performed under the supervision of the Cardiology staff. At peak effect of the drug, 33 mCi Tc-55m tetrofosmin was injected intravenously and standard myocardial SPECT imaging was performed. Quantitative gated imaging was also performed to evaluate left ventricular wall motion, and estimate left ventricular ejection fraction. COMPARISON:  None. FINDINGS: Perfusion: Fixed defect at the anterior apex, small size moderate severity. There is questionable reversible perfusion defect at this location extending into the anterior wall. Wall Motion: Global hypokinesia Left Ventricular Ejection Fraction: 31 % End diastolic volume 765 ml End systolic volume 465 ml IMPRESSION: 1. Fixed defect at the anterior apex of small size moderate severity, with questionable peri-infarct ischemia on the stress images. 2. Global hypokinesia. 3. Left ventricular ejection fraction 31% 4. Non invasive risk stratification*: High *2012 Appropriate Use Criteria for Coronary Revascularization Focused Update: J Am Coll Cardiol. 0354;65(6):812-751. http://content.airportbarriers.com.aspx?articleid=1201161 Electronically Signed   By: Corrie Mckusick D.O.   On: 09/04/2020  16:40   DG Chest Port 1 View  Result Date: 09/03/2020 CLINICAL DATA:  Central chest pain beginning on Saturday EXAM: PORTABLE CHEST 1 VIEW COMPARISON:  05/29/2020 FINDINGS: Cardiomegaly. CABG. There is no edema, consolidation, effusion, or pneumothorax. Unremarkable sternal wires IMPRESSION: No evidence of acute disease. Cardiomegaly. Electronically Signed   By: Monte Fantasia M.D.   On: 09/03/2020 04:54   ECHOCARDIOGRAM LIMITED  Result Date: 09/03/2020    ECHOCARDIOGRAM LIMITED REPORT   Patient Name:   JULUIS FITZSIMMONS Date of Exam: 09/03/2020 Medical Rec #:  [id number]         Height:       73.0 in Accession #:    [id number]        Weight:       229.3 lb Date of Birth:  March 16, 1955         BSA:          2.280 m Patient Age:    39 years          BP:            172/83 mmHg Patient Gender: M                 HR:           87 bpm. Exam Location:  Inpatient Procedure: Limited Echo, Cardiac Doppler and Limited Color Doppler Indications:    Chest Pain R07.9  History:        Patient has prior history of Echocardiogram examinations, most                 recent 05/30/2020. Previous Myocardial Infarction and CAD, Prior                 CABG; Risk Factors:Hypertension, Diabetes and Dyslipidemia. ACS                 (acute coronary syndrome) ([place]).  Sonographer:    Alvino Chapel RCS Referring Phys: [id number] RONDELL A SMITH IMPRESSIONS  1. Left ventricular ejection fraction, by estimation, is 45 to 50%. The left ventricle has mildly decreased function. Left ventricular endocardial border not optimally defined to evaluate regional wall motion - regional wall motion abnormalities are seen. There is severe left ventricular hypertrophy.  2. Right ventricular systolic function is mildly reduced. The right ventricular size is normal. There is severely elevated pulmonary artery systolic pressure. The estimated right ventricular systolic pressure is 22.2 mmHg.  3. The mitral valve is grossly normal. Mild to moderate mitral valve regurgitation.  4. The aortic valve is tricuspid. There is mild calcification of the aortic valve. Aortic valve regurgitation is trivial.  5. The inferior vena cava is dilated in size with <50% respiratory variability, suggesting right atrial pressure of 15 mmHg. Comparison(s): A prior study was performed on 05/30/20. Prior images reviewed side by side. Regional wall motion abnormalities have not significantly changed, however LVEF appears less today, with apical function appearing less vigorous. FINDINGS  Left Ventricle: Left ventricular ejection fraction, by estimation, is 45 to 50%. The left ventricle has mildly decreased function. Left ventricular endocardial border not optimally defined to evaluate regional wall motion. There is severe left ventricular hypertrophy. Left  ventricular diastolic function could not be evaluated.  LV Wall Scoring: The inferior wall, basal anteroseptal segment, mid inferoseptal segment, and basal inferoseptal segment are hypokinetic. Right Ventricle: The right ventricular size is normal. Right ventricular systolic function is mildly reduced. There is severely elevated pulmonary artery systolic pressure. The tricuspid regurgitant velocity is  3.41 m/s, and with an assumed right atrial pressure of 15 mmHg, the estimated right ventricular systolic pressure is 01.7 mmHg. Mitral Valve: The mitral valve is grossly normal. Mild to moderate mitral valve regurgitation. Tricuspid Valve: The tricuspid valve is normal in structure. Tricuspid valve regurgitation is mild. Aortic Valve: The aortic valve is tricuspid. There is mild calcification of the aortic valve. Aortic valve regurgitation is trivial. Venous: The inferior vena cava is dilated in size with less than 50% respiratory variability, suggesting right atrial pressure of 15 mmHg. LEFT VENTRICLE PLAX 2D LVIDd:         5.40 cm LVIDs:         3.85 cm LV PW:         1.70 cm LV IVS:        1.30 cm LVOT diam:     2.20 cm LVOT Area:     3.80 cm  RIGHT VENTRICLE TAPSE (M-mode): 1.6 cm LEFT ATRIUM         Index LA diam:    4.90 cm 2.15 cm/m   AORTA Ao Root diam: 4.60 cm MITRAL VALVE                TRICUSPID VALVE MV Area (PHT): 5.13 cm     TR Peak grad:   46.5 mmHg MV Decel Time: 148 msec     TR Vmax:        341.00 cm/s MR Peak grad: 149.3 mmHg MR Mean grad: 114.0 mmHg    SHUNTS MR Vmax:      611.00 cm/s   Systemic Diam: 2.20 cm MR Vmean:     517.0 cm/s MV E velocity: 108.00 cm/s MV A velocity: 37.70 cm/s MV E/A ratio:  2.86 Cherlynn Kaiser MD Electronically signed by Cherlynn Kaiser MD Signature Date/Time: 09/03/2020/5:42:31 PM    Final       Subjective:  Patient reports he is feeling better today, denies any fever, chills, cough, shortness of breath or chest pain.  Discharge Exam: Vitals:   09/06/20 0826  09/06/20 1300  BP:  (!) 127/58  Pulse:  70  Resp: 19 19  Temp:  99 F (37.2 C)  SpO2:     Vitals:   09/05/20 2117 09/06/20 0525 09/06/20 0826 09/06/20 1300  BP: (!) 163/94 (!) 170/92  (!) 127/58  Pulse:  78  70  Resp:  $Remo'20 19 19  'iwZSS$ Temp:  98.9 F (37.2 C)  99 F (37.2 C)  TempSrc:  Oral  Oral  SpO2:  99%    Weight:  104.5 kg    Height:        General: Pt is alert, awake, not in acute distress Cardiovascular: RRR, S1/S2 +, no rubs, no gallops Respiratory: CTA bilaterally, no wheezing, no rhonchi Abdominal: Soft, NT, ND, bowel sounds + Extremities: no edema, no cyanosis    The results of significant diagnostics from this hospitalization (including imaging, microbiology, ancillary and laboratory) are listed below for reference.     Microbiology: Recent Results (from the past 240 hour(s))  SARS CORONAVIRUS 2 (TAT 6-24 HRS) Nasopharyngeal Nasopharyngeal Swab     Status: None   Collection Time: 09/03/20  3:38 AM   Specimen: Nasopharyngeal Swab  Result Value Ref Range Status   SARS Coronavirus 2 NEGATIVE NEGATIVE Final    Comment: (NOTE) SARS-CoV-2 target nucleic acids are NOT DETECTED.  The SARS-CoV-2 RNA is generally detectable in upper and lower respiratory specimens during the acute phase of infection. Negative results do not preclude SARS-CoV-2  infection, do not rule out co-infections with other pathogens, and should not be used as the sole basis for treatment or other patient management decisions. Negative results must be combined with clinical observations, patient history, and epidemiological information. The expected result is Negative.  Fact Sheet for Patients: SugarRoll.be  Fact Sheet for Healthcare Providers: https://www.woods-mathews.com/  This test is not yet approved or cleared by the [place] FDA and  has been authorized for detection and/or diagnosis of SARS-CoV-2 by FDA under an Emergency Use  Authorization (EUA). This EUA will remain  in effect (meaning this test can be used) for the duration of the COVID-19 declaration under Se ction 564(b)(1) of the Act, 21 U.S.C. section 360bbb-3(b)(1), unless the authorization is terminated or revoked sooner.  Performed at [facility], [place] [address] [place], [place] [address]      Labs: BNP (last 3 results) Recent Labs    09/03/20 1525  BNP 6,045.4*   Basic Metabolic Panel: Recent Labs  Lab 09/03/20 0334 09/04/20 0059 09/04/20 1354 09/05/20 0129 09/06/20 0252  NA 137 136 137 133* 134*  K 4.6 4.4 4.5 4.3 4.5  CL 106 108 110 106 106  CO2 21* 22 20* 23 21*  GLUCOSE 178* 114* 128* 109* 114*  BUN 32* 33* 31* 32* 33*  CREATININE 1.94* 1.89* 1.67* 1.77* 1.81*  CALCIUM 8.6* 8.4* 8.4* 8.2* 8.3*   Liver Function Tests: Recent Labs  Lab 09/04/20 0059  AST 20  ALT 17  ALKPHOS 62  BILITOT 0.5  PROT 6.0*  ALBUMIN 2.7*   No results for input(s): LIPASE, AMYLASE in the last 168 hours. No results for input(s): AMMONIA in the last 168 hours. CBC: Recent Labs  Lab 09/03/20 0334 09/04/20 0059 09/04/20 1354 09/05/20 0129 09/06/20 0252  WBC 8.2 5.4  --  6.3 5.7  HGB 9.5* 7.8* 8.5* 8.2* 8.2*  HCT 32.2* 26.9* 29.2* 28.3* 28.2*  MCV 80.9 80.5  --  79.9* 79.9*  PLT 241 186  --  189 209   Cardiac Enzymes: No results for input(s): CKTOTAL, CKMB, CKMBINDEX, TROPONINI in the last 168 hours. BNP: Invalid input(s): POCBNP CBG: No results for input(s): GLUCAP in the last 168 hours. D-Dimer No results for input(s): DDIMER in the last 72 hours. Hgb A1c Recent Labs    09/03/20 1525  HGBA1C 5.6   Lipid Profile No results for input(s): CHOL, HDL, LDLCALC, TRIG, CHOLHDL, LDLDIRECT in the last 72 hours. Thyroid function studies No results for input(s): TSH, T4TOTAL, T3FREE, THYROIDAB in the last 72 hours.  Invalid input(s): FREET3 Anemia work up Recent Labs    09/06/20 0252  VITAMINB12 215  FOLATE 12.5   FERRITIN 7*  TIBC 384  IRON 18*  RETICCTPCT 2.1   Urinalysis    Component Value Date/Time   COLORURINE BROWN (A) 08/05/2019 2340   APPEARANCEUR TURBID (A) 08/05/2019 2340   LABSPEC  08/05/2019 2340    TEST NOT REPORTED DUE TO COLOR INTERFERENCE OF URINE PIGMENT   PHURINE  08/05/2019 2340    TEST NOT REPORTED DUE TO COLOR INTERFERENCE OF URINE PIGMENT   GLUCOSEU (A) 08/05/2019 2340    TEST NOT REPORTED DUE TO COLOR INTERFERENCE OF URINE PIGMENT   HGBUR (A) 08/05/2019 2340    TEST NOT REPORTED DUE TO COLOR INTERFERENCE OF URINE PIGMENT   HGBUR large 10/22/2007 0921   BILIRUBINUR (A) 08/05/2019 2340    TEST NOT REPORTED DUE TO COLOR INTERFERENCE OF URINE PIGMENT   KETONESUR (A) 08/05/2019 2340  TEST NOT REPORTED DUE TO COLOR INTERFERENCE OF URINE PIGMENT   PROTEINUR (A) 08/05/2019 2340    TEST NOT REPORTED DUE TO COLOR INTERFERENCE OF URINE PIGMENT   UROBILINOGEN 1.0 10/25/2012 0240   NITRITE (A) 08/05/2019 2340    TEST NOT REPORTED DUE TO COLOR INTERFERENCE OF URINE PIGMENT   LEUKOCYTESUR (A) 08/05/2019 2340    TEST NOT REPORTED DUE TO COLOR INTERFERENCE OF URINE PIGMENT   Sepsis Labs Invalid input(s): PROCALCITONIN,  WBC,  LACTICIDVEN Microbiology Recent Results (from the past 240 hour(s))  SARS CORONAVIRUS 2 (TAT 6-24 HRS) Nasopharyngeal Nasopharyngeal Swab     Status: None   Collection Time: 09/03/20  3:38 AM   Specimen: Nasopharyngeal Swab  Result Value Ref Range Status   SARS Coronavirus 2 NEGATIVE NEGATIVE Final    Comment: (NOTE) SARS-CoV-2 target nucleic acids are NOT DETECTED.  The SARS-CoV-2 RNA is generally detectable in upper and lower respiratory specimens during the acute phase of infection. Negative results do not preclude SARS-CoV-2 infection, do not rule out co-infections with other pathogens, and should not be used as the sole basis for treatment or other patient management decisions. Negative results must be combined with clinical  observations, patient history, and epidemiological information. The expected result is Negative.  Fact Sheet for Patients: SugarRoll.be  Fact Sheet for Healthcare Providers: https://www.woods-mathews.com/  This test is not yet approved or cleared by the [place] FDA and  has been authorized for detection and/or diagnosis of SARS-CoV-2 by FDA under an Emergency Use Authorization (EUA). This EUA will remain  in effect (meaning this test can be used) for the duration of the COVID-19 declaration under Se ction 564(b)(1) of the Act, 21 U.S.C. section 360bbb-3(b)(1), unless the authorization is terminated or revoked sooner.  Performed at [facility], [place] [address], [place], [place] [address]      Time coordinating discharge: Over 30 minutes  SIGNED:   Phillips Climes, MD  [facility] 09/06/2020, 1:38 PM Pager   If 7PM-7AM, please contact night-coverage [email or web address] Password [email or web address]

## 2020-09-06 NOTE — Discharge Instructions (Signed)
Follow with Primary MD Hoy Register, MD in 7 days   Get CBC, CMP,  checked  by Primary MD next visit.    Activity: As tolerated with Full fall precautions use walker/cane & assistance as needed   Disposition Home    Diet: Heart Healthy , with feeding assistance and aspiration precautions.  For Heart failure patients - Check your Weight same time everyday, if you gain over 2 pounds, or you develop in leg swelling, experience more shortness of breath or chest pain, call your Primary MD immediately. Follow Cardiac Low Salt Diet and 1.5 lit/day fluid restriction.   On your next visit with your primary care physician please Get Medicines reviewed and adjusted.   Please request your Prim.MD to go over all Hospital Tests and Procedure/Radiological results at the follow up, please get all Hospital records sent to your Prim MD by signing hospital release before you go home.   If you experience worsening of your admission symptoms, develop shortness of breath, life threatening emergency, suicidal or homicidal thoughts you must seek medical attention immediately by calling 911 or calling your MD immediately  if symptoms less severe.  You Must read complete instructions/literature along with all the possible adverse reactions/side effects for all the Medicines you take and that have been prescribed to you. Take any new Medicines after you have completely understood and accpet all the possible adverse reactions/side effects.   Do not drive, operating heavy machinery, perform activities at heights, swimming or participation in water activities or provide baby sitting services if your were admitted for syncope or siezures until you have seen by Primary MD or a Neurologist and advised to do so again.  Do not drive when taking Pain medications.    Do not take more than prescribed Pain, Sleep and Anxiety Medications  Special Instructions: If you have smoked or chewed Tobacco  in the last 2 yrs  please stop smoking, stop any regular Alcohol  and or any Recreational drug use.  Wear Seat belts while driving.   Please note  You were cared for by a hospitalist during your hospital stay. If you have any questions about your discharge medications or the care you received while you were in the hospital after you are discharged, you can call the unit and asked to speak with the hospitalist on call if the hospitalist that took care of you is not available. Once you are discharged, your primary care physician will handle any further medical issues. Please note that NO REFILLS for any discharge medications will be authorized once you are discharged, as it is imperative that you return to your primary care physician (or establish a relationship with a primary care physician if you do not have one) for your aftercare needs so that they can reassess your need for medications and monitor your lab values.

## 2020-09-06 NOTE — TOC Initial Note (Signed)
Transition of Care [facility]) - Initial/Assessment Note    Patient Details  Name: Darrell [name] MRN: [id number] Date of Birth: 08/11/1955  [facility]) CM/SW Contact:    Leone Haven, RN Phone Number: 09/06/2020, 2:56 PM  Clinical Narrative:                 NCM spoke with patient, he states he will make his follow up apt at the [facility] clinic.  He states his son is transporting him home today.  He has no issues with taking or buying his medications.   Expected Discharge Plan: Home/Self Care Barriers to Discharge: No Barriers Identified   Patient Goals and CMS Choice Patient states their goals for this hospitalization and ongoing recovery are:: return home   Choice offered to / list presented to : NA  Expected Discharge Plan and Services Expected Discharge Plan: Home/Self Care In-house Referral: NA Discharge Planning Services: CM Consult Post Acute Care Choice: NA Living arrangements for the past 2 months: Single Family Home Expected Discharge Date: 09/06/20                 DME Agency: NA       HH Arranged: NA          Prior Living Arrangements/Services Living arrangements for the past 2 months: Single Family Home Lives with:: Spouse Patient language and need for interpreter reviewed:: Yes Do you feel safe going back to the place where you live?: Yes      Need for Family Participation in Patient Care: Yes (Comment) Care giver support system in place?: Yes (comment)   Criminal Activity/Legal Involvement Pertinent to Current Situation/Hospitalization: No - Comment as needed  Activities of Daily Living Home Assistive Devices/Equipment: Walker (specify type) ADL Screening (condition at time of admission) Patient's cognitive ability adequate to safely complete daily activities?: Yes Is the patient deaf or have difficulty hearing?: No Does the patient have difficulty seeing, even when wearing glasses/contacts?: No Does the patient have difficulty  concentrating, remembering, or making decisions?: No Patient able to express need for assistance with ADLs?: Yes Does the patient have difficulty dressing or bathing?: Yes Independently performs ADLs?: Yes (appropriate for developmental age) Does the patient have difficulty walking or climbing stairs?: No Weakness of Legs: None Weakness of Arms/Hands: None  Permission Sought/Granted                  Emotional Assessment   Attitude/Demeanor/Rapport: Engaged Affect (typically observed): Appropriate Orientation: : Oriented to  Time, Oriented to Situation, Oriented to Place, Oriented to Self Alcohol / Substance Use: Not Applicable Psych Involvement: No (comment)  Admission diagnosis:  Pain [R52] Chest pain [R07.9] Patient Active Problem List   Diagnosis Date Noted   Hematuria    Bleeding gums 08/01/2018   Ischemic chest pain (HCC) 07/31/2018   Renal insufficiency    ACS (acute coronary syndrome) (HCC) 05/03/2018   Mild anemia 02/08/2018   Acute kidney injury superimposed on chronic kidney disease (HCC)    Diabetes mellitus type 2 in nonobese (HCC)    Hyperlipidemia with target LDL less than 70    Hypertension    Right carotid bruit    Sepsis (HCC) 03/02/2016   Chest pain    Hyponatremia 01/03/2016   Hypertensive emergency    Non-ST elevation (NSTEMI) myocardial infarction (HCC) 04/04/2015   HTN (hypertension) 04/04/2015   Elevated troponin    Hypertensive urgency 03/25/2014   History of noncompliance with medical treatment, presenting hazards to health  03/25/2014   Claudication (HCC) 10/28/2012   GERD 10/23/2007   NEPHROLITHIASIS 10/23/2007   HYPERCHOLESTEROLEMIA 10/22/2007   ADJUSTMENT DISORDER WITH DEPRESSED MOOD 10/22/2007   Type 2 diabetes mellitus without complication, without long-term current use of insulin (HCC) 01/29/2003   Coronary atherosclerosis, s/p CABG in 2009  01/28/2001   PCP:  Hoy Register, MD Pharmacy:   [facility]  Pharmacy [address] [place] [address] Phone: [phone] Fax: [phone]  [facility] [address] [place] [address] Phone: [phone] Fax: [phone]     Social Determinants of Health (SDOH) Interventions    Readmission Risk Interventions Readmission Risk Prevention Plan 09/06/2020  Transportation Screening Complete  PCP or Specialist Appt within 3-5 Days Complete  HRI or Home Care Consult Complete  Social Work Consult for Recovery Care Planning/Counseling Complete  Palliative Care Screening Not Applicable  Medication Review Oceanographer) Complete  Some recent data might be hidden

## 2020-09-06 NOTE — TOC Transition Note (Signed)
Transition of Care [facility]) - CM/SW Discharge Note   Patient Details  Name: Darrell [name] MRN: [id number] Date of Birth: 10/29/1955  Transition of Care [facility]) CM/SW Contact:  Leone Haven, RN Phone Number: 09/06/2020, 2:58 PM   Clinical Narrative:    NCM spoke with patient, he states he will make his follow up apt at the [facility] clinic.  He states his son is transporting him home today.  He has no issues with taking or buying his medications.     Final next level of care: Home/Self Care Barriers to Discharge: No Barriers Identified   Patient Goals and CMS Choice Patient states their goals for this hospitalization and ongoing recovery are:: return home   Choice offered to / list presented to : NA  Discharge Placement                       Discharge Plan and Services In-house Referral: NA Discharge Planning Services: CM Consult Post Acute Care Choice: NA            DME Agency: NA       HH Arranged: NA          Social Determinants of Health (SDOH) Interventions     Readmission Risk Interventions Readmission Risk Prevention Plan 09/06/2020  Transportation Screening Complete  PCP or Specialist Appt within 3-5 Days Complete  HRI or Home Care Consult Complete  Social Work Consult for Recovery Care Planning/Counseling Complete  Palliative Care Screening Not Applicable  Medication Review Oceanographer) Complete  Some recent data might be hidden

## 2020-09-07 ENCOUNTER — Telehealth: Payer: Self-pay

## 2020-09-07 NOTE — Telephone Encounter (Signed)
Transition Care Management Unsuccessful Follow-up Telephone Call  Date of discharge and from where:  09/06/2020, [facility]   Attempts:  1st Attempt  Reason for unsuccessful TCM follow-up call:  Left voice message with patient's daughter # [phone] She said she would have her mother tell him to call this CM back.  Need to discuss scheduling a hospital follow up appointment.

## 2020-09-08 ENCOUNTER — Telehealth: Payer: Self-pay

## 2020-09-08 NOTE — Telephone Encounter (Signed)
Transition Care Management Unsuccessful Follow-up Telephone Call   Date of discharge and from where:  09/06/2020, [facility]    Attempts:  2nd Attempt   Reason for unsuccessful TCM follow-up call:  [phone] She said I need to reach him at [phone]. Called placed no answered. Left VM to call back   Need to discuss scheduling a hospital follow up appointment.

## 2020-09-11 ENCOUNTER — Telehealth: Payer: Self-pay

## 2020-09-11 NOTE — Telephone Encounter (Signed)
Transition Care Management Unsuccessful Follow-up Telephone Call  Date of discharge and from where:  09/06/2020, [facility]   Attempts:  3rd Attempt  Reason for unsuccessful TCM follow-up call:  Left voice message on # [phone].  Letter also sent to patient requesting he contact [facility] to schedule a hospital follow up appointment

## 2020-09-27 ENCOUNTER — Other Ambulatory Visit: Payer: Self-pay

## 2020-10-08 NOTE — Progress Notes (Deleted)
Office Visit    Patient Name: Darrell [name] Date of Encounter: 10/08/2020  PCP:  Hoy Register, MD   [facility]  Cardiologist:  [name], MD  Advanced Practice Provider:  No care team member to display Electrophysiologist:  None     Chief Complaint    Darrell [name] is a 65 y.o. male with a hx of *** presents today for hospital follow up   Past Medical History    Past Medical History:  Diagnosis Date   CKD (chronic kidney disease), stage III (HCC)    Claudication (HCC)    Coronary artery disease    a. 08/2001 s/p DES to RCA/RI;  b. 07/2006 s/p DES to p/d LCX;  c. 11/2007 CABGx5 (LIMA->LAD, VG->D1, LRA->OM1, VG->PDA->LPL); c. 03/2015: Synergy DES to mid-Cx in the setting of NSTEMI; d. 11/2015 NSTEMI->Med Rx. e. NSTEMI 01/2018 -> med rx.   Diabetes mellitus type 2 in nonobese [facility])    a. A1c 7.0 09/2012.   Diastolic dysfunction    a. 02/2016 Echo: EF 50-55%, gr1DD, Ao sclerosis, dil Ao root (40mm), sev dil LA, triv TR, PASP .   Hyperlipidemia    Hypertension    Noncompliance    Right carotid bruit    a. noted 01/2018, will need OP eval when patient complies with follow-up.   Past Surgical History:  Procedure Laterality Date   CARDIAC CATHETERIZATION N/A 04/06/2015   Procedure: Left Heart Cath and Cors/Grafts Angiography;  Surgeon: Runell Gess, MD;  Location: [facility] INVASIVE CV LAB;  Service: Cardiovascular;  Laterality: N/A;   CARDIAC CATHETERIZATION N/A 04/06/2015   Procedure: Coronary Stent Intervention;  Surgeon: Runell Gess, MD;  2.5 mm x 12 mm long Synergy DES followed by  2.5 mm x 16 mm long Synergy DES     CORONARY ARTERY BYPASS GRAFT  2009   2009 LIMA to LAD, SVG to Diag, SVG to PDA and PL, left radial to OM   CORONARY BALLOON ANGIOPLASTY N/A 05/04/2018   Procedure: CORONARY BALLOON ANGIOPLASTY;  Surgeon: Runell Gess, MD;  Location: [facility] INVASIVE CV LAB;  Service: Cardiovascular;  Laterality: N/A;  Prox CFX   LEFT  HEART CATH N/A 10/24/2012   Procedure: LEFT HEART CATH;  Surgeon: Lennette Bihari, MD;  Location: [facility] CATH LAB;  Service: Cardiovascular;  Laterality: N/A;   LEFT HEART CATH AND CORS/GRAFTS ANGIOGRAPHY N/A 05/04/2018   Procedure: LEFT HEART CATH AND CORS/GRAFTS ANGIOGRAPHY;  Surgeon: Runell Gess, MD;  Location: [facility] INVASIVE CV LAB;  Service: Cardiovascular;  Laterality: N/A;    Allergies  No Known Allergies  History of Present Illness    Darrell [name] is a 65 y.o. male with a hx of HTN, HLD, CAD s/p CABG and subsequent PCI, HFpEF, CKDIII last seen while hospitalized.   EKGs/Labs/Other Studies Reviewed:   The following studies were reviewed today:   Lexiscan: 09/04/20   IMPRESSION: 1. Fixed defect at the anterior apex of small size moderate severity, with questionable peri-infarct ischemia on the stress images.   2. Global hypokinesia.   3. Left ventricular ejection fraction 31%   4. Non invasive risk stratification*: High   *2012 Appropriate Use Criteria for Coronary Revascularization Focused Update: J Am Coll Cardiol. 2012;59(9):857-881. http://content.dementiazones.com.aspx?articleid=1201161     Electronically Signed   By: Gilmer Mor D.O.   On: 09/04/2020 16:40  EKG: No EKG today  Recent Labs: 05/29/2020: TSH 1.151 09/03/2020: B Natriuretic Peptide 1,578.3 09/04/2020: ALT 17 09/06/2020: BUN 33;  Creatinine, Ser 1.81; Hemoglobin 8.2; Platelets 209; Potassium 4.5; Sodium 134  Recent Lipid Panel    Component Value Date/Time   CHOL 167 09/03/2020 0529   TRIG 43 09/03/2020 0529   HDL 38 (L) 09/03/2020 0529   CHOLHDL 4.4 09/03/2020 0529   VLDL 9 09/03/2020 0529   LDLCALC 120 (H) 09/03/2020 0529   Home Medications   No outpatient medications have been marked as taking for the 10/09/20 encounter (Appointment) with Alver Sorrow, NP.     Review of Systems      All other systems reviewed and are otherwise negative except as noted above.  Physical  Exam    VS:  There were no vitals taken for this visit. , BMI There is no height or weight on file to calculate BMI.  Wt Readings from Last 3 Encounters:  09/06/20 230 lb 4.8 oz (104.5 kg)  05/29/20 227 lb 4.7 oz (103.1 kg)  08/06/19 227 lb 1.2 oz (103 kg)     GEN: Well nourished, well developed, in no acute distress. HEENT: normal. Neck: Supple, no JVD, carotid bruits, or masses. Cardiac: ***RRR, no murmurs, rubs, or gallops. No clubbing, cyanosis, edema.  ***Radials/PT 2+ and equal bilaterally.  Respiratory:  ***Respirations regular and unlabored, clear to auscultation bilaterally. GI: Soft, nontender, nondistended. MS: No deformity or atrophy. Skin: Warm and dry, no rash. Neuro:  Strength and sensation are intact. Psych: Normal affect.  Assessment & Plan    CAD s/p CABG and subsequent PCI - recent admission with NSTEMI and HD troponin peak at 1687. He refused cath despite lexiscan myoview with peri infarct ischemia in anterior apex. *** CKD III - Careful titration of diuretic and antihypertensive.  No ACE/[name] due to CKD.  HTN -  HLD, LDL goal <70 -  Anemia -  Noncompliance -   Disposition: Follow up {follow up:15908} with [name] or APP.  Signed, Alver Sorrow, NP 10/08/2020, 9:02 PM [facility]

## 2020-10-09 ENCOUNTER — Ambulatory Visit (HOSPITAL_BASED_OUTPATIENT_CLINIC_OR_DEPARTMENT_OTHER): Payer: Medicare Other | Admitting: Family

## 2020-10-15 ENCOUNTER — Encounter (HOSPITAL_COMMUNITY): Payer: Self-pay

## 2020-10-15 ENCOUNTER — Emergency Department (HOSPITAL_COMMUNITY): Payer: Medicare Other

## 2020-10-15 ENCOUNTER — Other Ambulatory Visit: Payer: Self-pay

## 2020-10-15 ENCOUNTER — Inpatient Hospital Stay (HOSPITAL_COMMUNITY)
Admission: EM | Admit: 2020-10-15 | Discharge: 2020-11-02 | DRG: 280 | Disposition: A | Discharge status: Home / Self Care | Payer: Medicare Other | Attending: Internal Medicine | Admitting: Internal Medicine

## 2020-10-15 DIAGNOSIS — Z4659 Encounter for fitting and adjustment of other gastrointestinal appliance and device: Secondary | ICD-10-CM

## 2020-10-15 DIAGNOSIS — T82855D Stenosis of coronary artery stent, subsequent encounter: Secondary | ICD-10-CM

## 2020-10-15 DIAGNOSIS — R34 Anuria and oliguria: Secondary | ICD-10-CM | POA: Diagnosis not present

## 2020-10-15 DIAGNOSIS — B9620 Unspecified Escherichia coli [E. coli] as the cause of diseases classified elsewhere: Secondary | ICD-10-CM | POA: Diagnosis present

## 2020-10-15 DIAGNOSIS — I214 Non-ST elevation (NSTEMI) myocardial infarction: Principal | ICD-10-CM | POA: Diagnosis present

## 2020-10-15 DIAGNOSIS — I517 Cardiomegaly: Secondary | ICD-10-CM | POA: Diagnosis not present

## 2020-10-15 DIAGNOSIS — I10 Essential (primary) hypertension: Secondary | ICD-10-CM

## 2020-10-15 DIAGNOSIS — I25810 Atherosclerosis of coronary artery bypass graft(s) without angina pectoris: Secondary | ICD-10-CM | POA: Diagnosis present

## 2020-10-15 DIAGNOSIS — I493 Ventricular premature depolarization: Secondary | ICD-10-CM | POA: Diagnosis present

## 2020-10-15 DIAGNOSIS — R918 Other nonspecific abnormal finding of lung field: Secondary | ICD-10-CM | POA: Diagnosis not present

## 2020-10-15 DIAGNOSIS — Z452 Encounter for adjustment and management of vascular access device: Secondary | ICD-10-CM | POA: Diagnosis not present

## 2020-10-15 DIAGNOSIS — D649 Anemia, unspecified: Secondary | ICD-10-CM

## 2020-10-15 DIAGNOSIS — R000 Tachycardia, unspecified: Secondary | ICD-10-CM | POA: Diagnosis not present

## 2020-10-15 DIAGNOSIS — I248 Other forms of acute ischemic heart disease: Secondary | ICD-10-CM | POA: Diagnosis not present

## 2020-10-15 DIAGNOSIS — E872 Acidosis, unspecified: Secondary | ICD-10-CM | POA: Diagnosis present

## 2020-10-15 DIAGNOSIS — I959 Hypotension, unspecified: Secondary | ICD-10-CM | POA: Diagnosis not present

## 2020-10-15 DIAGNOSIS — Z9119 Patient's noncompliance with other medical treatment and regimen: Secondary | ICD-10-CM | POA: Diagnosis not present

## 2020-10-15 DIAGNOSIS — E1151 Type 2 diabetes mellitus with diabetic peripheral angiopathy without gangrene: Secondary | ICD-10-CM | POA: Diagnosis present

## 2020-10-15 DIAGNOSIS — N1832 Chronic kidney disease, stage 3b: Secondary | ICD-10-CM | POA: Diagnosis present

## 2020-10-15 DIAGNOSIS — E669 Obesity, unspecified: Secondary | ICD-10-CM | POA: Diagnosis present

## 2020-10-15 DIAGNOSIS — N390 Urinary tract infection, site not specified: Secondary | ICD-10-CM | POA: Diagnosis present

## 2020-10-15 DIAGNOSIS — F4321 Adjustment disorder with depressed mood: Secondary | ICD-10-CM | POA: Diagnosis not present

## 2020-10-15 DIAGNOSIS — N179 Acute kidney failure, unspecified: Secondary | ICD-10-CM | POA: Diagnosis not present

## 2020-10-15 DIAGNOSIS — R509 Fever, unspecified: Secondary | ICD-10-CM

## 2020-10-15 DIAGNOSIS — R079 Chest pain, unspecified: Secondary | ICD-10-CM

## 2020-10-15 DIAGNOSIS — Y846 Urinary catheterization as the cause of abnormal reaction of the patient, or of later complication, without mention of misadventure at the time of the procedure: Secondary | ICD-10-CM | POA: Diagnosis not present

## 2020-10-15 DIAGNOSIS — E7800 Pure hypercholesterolemia, unspecified: Secondary | ICD-10-CM | POA: Diagnosis present

## 2020-10-15 DIAGNOSIS — J9601 Acute respiratory failure with hypoxia: Secondary | ICD-10-CM | POA: Diagnosis present

## 2020-10-15 DIAGNOSIS — I472 Ventricular tachycardia, unspecified: Secondary | ICD-10-CM | POA: Diagnosis not present

## 2020-10-15 DIAGNOSIS — I469 Cardiac arrest, cause unspecified: Secondary | ICD-10-CM | POA: Diagnosis not present

## 2020-10-15 DIAGNOSIS — N170 Acute kidney failure with tubular necrosis: Secondary | ICD-10-CM | POA: Diagnosis present

## 2020-10-15 DIAGNOSIS — I5042 Chronic combined systolic (congestive) and diastolic (congestive) heart failure: Secondary | ICD-10-CM | POA: Diagnosis present

## 2020-10-15 DIAGNOSIS — I130 Hypertensive heart and chronic kidney disease with heart failure and stage 1 through stage 4 chronic kidney disease, or unspecified chronic kidney disease: Secondary | ICD-10-CM | POA: Diagnosis present

## 2020-10-15 DIAGNOSIS — I25710 Atherosclerosis of autologous vein coronary artery bypass graft(s) with unstable angina pectoris: Secondary | ICD-10-CM

## 2020-10-15 DIAGNOSIS — I471 Supraventricular tachycardia: Secondary | ICD-10-CM | POA: Diagnosis not present

## 2020-10-15 DIAGNOSIS — Z20822 Contact with and (suspected) exposure to covid-19: Secondary | ICD-10-CM | POA: Diagnosis present

## 2020-10-15 DIAGNOSIS — B888 Other specified infestations: Secondary | ICD-10-CM | POA: Diagnosis present

## 2020-10-15 DIAGNOSIS — Y831 Surgical operation with implant of artificial internal device as the cause of abnormal reaction of the patient, or of later complication, without mention of misadventure at the time of the procedure: Secondary | ICD-10-CM | POA: Diagnosis present

## 2020-10-15 DIAGNOSIS — F32A Depression, unspecified: Secondary | ICD-10-CM | POA: Diagnosis not present

## 2020-10-15 DIAGNOSIS — E871 Hypo-osmolality and hyponatremia: Secondary | ICD-10-CM | POA: Diagnosis not present

## 2020-10-15 DIAGNOSIS — I252 Old myocardial infarction: Secondary | ICD-10-CM

## 2020-10-15 DIAGNOSIS — D631 Anemia in chronic kidney disease: Secondary | ICD-10-CM | POA: Diagnosis present

## 2020-10-15 DIAGNOSIS — T83518A Infection and inflammatory reaction due to other urinary catheter, initial encounter: Secondary | ICD-10-CM | POA: Diagnosis not present

## 2020-10-15 DIAGNOSIS — E1122 Type 2 diabetes mellitus with diabetic chronic kidney disease: Secondary | ICD-10-CM | POA: Diagnosis present

## 2020-10-15 DIAGNOSIS — D509 Iron deficiency anemia, unspecified: Secondary | ICD-10-CM | POA: Diagnosis present

## 2020-10-15 DIAGNOSIS — R0602 Shortness of breath: Secondary | ICD-10-CM

## 2020-10-15 DIAGNOSIS — R0789 Other chest pain: Secondary | ICD-10-CM | POA: Diagnosis not present

## 2020-10-15 DIAGNOSIS — L859 Epidermal thickening, unspecified: Secondary | ICD-10-CM | POA: Diagnosis present

## 2020-10-15 DIAGNOSIS — I462 Cardiac arrest due to underlying cardiac condition: Secondary | ICD-10-CM | POA: Diagnosis not present

## 2020-10-15 DIAGNOSIS — Z91199 Patient's noncompliance with other medical treatment and regimen due to unspecified reason: Secondary | ICD-10-CM

## 2020-10-15 DIAGNOSIS — E119 Type 2 diabetes mellitus without complications: Secondary | ICD-10-CM

## 2020-10-15 DIAGNOSIS — I2510 Atherosclerotic heart disease of native coronary artery without angina pectoris: Secondary | ICD-10-CM | POA: Diagnosis not present

## 2020-10-15 DIAGNOSIS — Z841 Family history of disorders of kidney and ureter: Secondary | ICD-10-CM

## 2020-10-15 DIAGNOSIS — Z6831 Body mass index (BMI) 31.0-31.9, adult: Secondary | ICD-10-CM | POA: Diagnosis not present

## 2020-10-15 DIAGNOSIS — Z8249 Family history of ischemic heart disease and other diseases of the circulatory system: Secondary | ICD-10-CM

## 2020-10-15 DIAGNOSIS — E1159 Type 2 diabetes mellitus with other circulatory complications: Secondary | ICD-10-CM

## 2020-10-15 DIAGNOSIS — I208 Other forms of angina pectoris: Secondary | ICD-10-CM

## 2020-10-15 DIAGNOSIS — Z4682 Encounter for fitting and adjustment of non-vascular catheter: Secondary | ICD-10-CM | POA: Diagnosis not present

## 2020-10-15 DIAGNOSIS — I25110 Atherosclerotic heart disease of native coronary artery with unstable angina pectoris: Secondary | ICD-10-CM | POA: Diagnosis present

## 2020-10-15 DIAGNOSIS — N281 Cyst of kidney, acquired: Secondary | ICD-10-CM | POA: Diagnosis not present

## 2020-10-15 DIAGNOSIS — Z9114 Patient's other noncompliance with medication regimen: Secondary | ICD-10-CM | POA: Diagnosis not present

## 2020-10-15 DIAGNOSIS — B351 Tinea unguium: Secondary | ICD-10-CM | POA: Diagnosis present

## 2020-10-15 DIAGNOSIS — E785 Hyperlipidemia, unspecified: Secondary | ICD-10-CM | POA: Diagnosis present

## 2020-10-15 LAB — BASIC METABOLIC PANEL
Anion gap: 26 — ABNORMAL HIGH (ref 5–15)
BUN: 26 mg/dL — ABNORMAL HIGH (ref 8–23)
CO2: 11 mmol/L — ABNORMAL LOW (ref 22–32)
Calcium: 9.7 mg/dL (ref 8.9–10.3)
Chloride: 98 mmol/L (ref 98–111)
Creatinine, Ser: 2.27 mg/dL — ABNORMAL HIGH (ref 0.61–1.24)
GFR, Estimated: 31 mL/min — ABNORMAL LOW (ref >60)
Glucose, Bld: 179 mg/dL — ABNORMAL HIGH (ref 70–99)
Potassium: 3.8 mmol/L (ref 3.5–5.1)
Sodium: 135 mmol/L (ref 135–145)

## 2020-10-15 LAB — I-STAT VENOUS BLOOD GAS, ED
Acid-base deficit: 7 mmol/L — ABNORMAL HIGH (ref 0.0–2.0)
Bicarbonate: 17.3 mmol/L — ABNORMAL LOW (ref 20.0–28.0)
Calcium, Ion: 1.18 mmol/L (ref 1.15–1.40)
HCT: 26 % — ABNORMAL LOW (ref 39.0–52.0)
Hemoglobin: 8.8 g/dL — ABNORMAL LOW (ref 13.0–17.0)
O2 Saturation: 79 %
Potassium: 3.9 mmol/L (ref 3.5–5.1)
Sodium: 138 mmol/L (ref 135–145)
TCO2: 18 mmol/L — ABNORMAL LOW (ref 22–32)
pCO2, Ven: 29.8 mmHg — ABNORMAL LOW (ref 44.0–60.0)
pH, Ven: 7.371 (ref 7.250–7.430)
pO2, Ven: 44 mmHg (ref 32.0–45.0)

## 2020-10-15 LAB — CBC
HCT: 30.7 % — ABNORMAL LOW (ref 39.0–52.0)
Hemoglobin: 8.4 g/dL — ABNORMAL LOW (ref 13.0–17.0)
MCH: 22 pg — ABNORMAL LOW (ref 26.0–34.0)
MCHC: 27.4 g/dL — ABNORMAL LOW (ref 30.0–36.0)
MCV: 80.6 fL (ref 80.0–100.0)
Platelets: 292 10*3/uL (ref 150–400)
RBC: 3.81 MIL/uL — ABNORMAL LOW (ref 4.22–5.81)
RDW: 16.8 % — ABNORMAL HIGH (ref 11.5–15.5)
WBC: 8.6 10*3/uL (ref 4.0–10.5)
nRBC: 0 % (ref 0.0–0.2)

## 2020-10-15 LAB — BLOOD GAS, VENOUS
Acid-base deficit: 5.1 mmol/L — ABNORMAL HIGH (ref 0.0–2.0)
Bicarbonate: 19.7 mmol/L — ABNORMAL LOW (ref 20.0–28.0)
Drawn by: 164
FIO2: 28
O2 Saturation: 44.8 %
Patient temperature: 37
pCO2, Ven: 38 mmHg — ABNORMAL LOW (ref 44.0–60.0)
pH, Ven: 7.336 (ref 7.250–7.430)
pO2, Ven: <32 mmHg — CL (ref 32.0–45.0)

## 2020-10-15 LAB — TROPONIN I (HIGH SENSITIVITY)
Troponin I (High Sensitivity): 685 ng/L (ref <18)
Troponin I (High Sensitivity): 902 ng/L (ref <18)

## 2020-10-15 LAB — BETA-HYDROXYBUTYRIC ACID: Beta-Hydroxybutyric Acid: 5.31 mmol/L — ABNORMAL HIGH (ref 0.05–0.27)

## 2020-10-15 LAB — ETHANOL: Alcohol, Ethyl (B): <10 mg/dL (ref <10)

## 2020-10-15 LAB — RESP PANEL BY RT-PCR (FLU A&B, COVID) ARPGX2
Influenza A by PCR: NEGATIVE
Influenza B by PCR: NEGATIVE
SARS Coronavirus 2 by RT PCR: NEGATIVE

## 2020-10-15 LAB — MRSA NEXT GEN BY PCR, NASAL: MRSA by PCR Next Gen: NOT DETECTED

## 2020-10-15 LAB — SALICYLATE LEVEL: Salicylate Lvl: <7 mg/dL — ABNORMAL LOW (ref 7.0–30.0)

## 2020-10-15 LAB — D-DIMER, QUANTITATIVE: D-Dimer, Quant: 1.07 ug/mL-FEU — ABNORMAL HIGH (ref 0.00–0.50)

## 2020-10-15 LAB — LACTIC ACID, PLASMA: Lactic Acid, Venous: 1.4 mmol/L (ref 0.5–1.9)

## 2020-10-15 LAB — PROTIME-INR
INR: 1.2 (ref 0.8–1.2)
Prothrombin Time: 15.3 seconds — ABNORMAL HIGH (ref 11.4–15.2)

## 2020-10-15 LAB — HEPARIN LEVEL (UNFRACTIONATED): Heparin Unfractionated: 0.24 IU/mL — ABNORMAL LOW (ref 0.30–0.70)

## 2020-10-15 LAB — ACETAMINOPHEN LEVEL: Acetaminophen (Tylenol), Serum: <10 ug/mL — ABNORMAL LOW (ref 10–30)

## 2020-10-15 LAB — MAGNESIUM: Magnesium: 2 mg/dL (ref 1.7–2.4)

## 2020-10-15 MED ORDER — ISOSORBIDE MONONITRATE ER 60 MG PO TB24
120.0000 mg | ORAL_TABLET | Freq: Every day | ORAL | Status: DC
  Administered 2020-10-15: 120 mg via ORAL
  Filled 2020-10-15: qty 2

## 2020-10-15 MED ORDER — ALUM & MAG HYDROXIDE-SIMETH 200-200-20 MG/5ML PO SUSP: 30.0000 mL | Freq: Four times a day (QID) | ORAL | Status: DC | PRN

## 2020-10-15 MED ORDER — ASPIRIN EC 81 MG PO TBEC: 81.0000 mg | DELAYED_RELEASE_TABLET | Freq: Every day | ORAL | Status: DC

## 2020-10-15 MED ORDER — ONDANSETRON HCL 4 MG/2ML IJ SOLN
4.0000 mg | Freq: Four times a day (QID) | INTRAMUSCULAR | Status: DC | PRN
  Administered 2020-10-19: 4 mg via INTRAVENOUS
  Filled 2020-10-15: qty 2

## 2020-10-15 MED ORDER — SODIUM CHLORIDE 0.9 % IV BOLUS
500.0000 mL | Freq: Once | INTRAVENOUS | Status: AC
  Administered 2020-10-15: 500 mL via INTRAVENOUS

## 2020-10-15 MED ORDER — CARVEDILOL 25 MG PO TABS
25.0000 mg | ORAL_TABLET | Freq: Two times a day (BID) | ORAL | Status: DC
  Administered 2020-10-15 – 2020-10-16 (×2): 25 mg via ORAL
  Filled 2020-10-15 (×2): qty 1

## 2020-10-15 MED ORDER — ATORVASTATIN CALCIUM 80 MG PO TABS
80.0000 mg | ORAL_TABLET | Freq: Every evening | ORAL | Status: DC
  Administered 2020-10-15: 80 mg via ORAL
  Filled 2020-10-15: qty 1

## 2020-10-15 MED ORDER — HEPARIN BOLUS VIA INFUSION
4000.0000 [IU] | Freq: Once | INTRAVENOUS | Status: AC
  Administered 2020-10-15: 4000 [IU] via INTRAVENOUS
  Filled 2020-10-15: qty 4000

## 2020-10-15 MED ORDER — AMLODIPINE BESYLATE 10 MG PO TABS
10.0000 mg | ORAL_TABLET | Freq: Every day | ORAL | Status: DC
  Administered 2020-10-15: 10 mg via ORAL
  Filled 2020-10-15: qty 1

## 2020-10-15 MED ORDER — ALBUTEROL SULFATE (2.5 MG/3ML) 0.083% IN NEBU
2.5000 mg | INHALATION_SOLUTION | Freq: Four times a day (QID) | RESPIRATORY_TRACT | Status: DC | PRN
  Administered 2020-10-30: 2.5 mg via RESPIRATORY_TRACT
  Filled 2020-10-15: qty 3

## 2020-10-15 MED ORDER — ACETAMINOPHEN 325 MG PO TABS: 650.0000 mg | ORAL_TABLET | ORAL | Status: DC | PRN

## 2020-10-15 MED ORDER — LACTATED RINGERS IV BOLUS: 250.0000 mL | Freq: Once | INTRAVENOUS | Status: DC

## 2020-10-15 MED ORDER — ASPIRIN EC 81 MG PO TBEC
81.0000 mg | DELAYED_RELEASE_TABLET | Freq: Every day | ORAL | Status: DC
  Filled 2020-10-15 (×3): qty 1

## 2020-10-15 MED ORDER — NITROGLYCERIN 0.4 MG SL SUBL
0.4000 mg | SUBLINGUAL_TABLET | SUBLINGUAL | Status: DC | PRN
  Administered 2020-10-15: 0.4 mg via SUBLINGUAL
  Filled 2020-10-15: qty 1

## 2020-10-15 MED ORDER — HEPARIN (PORCINE) 25000 UT/250ML-% IV SOLN
1550.0000 [IU]/h | INTRAVENOUS | Status: DC
  Administered 2020-10-16: 1550 [IU]/h via INTRAVENOUS
  Filled 2020-10-15: qty 250

## 2020-10-15 MED ORDER — HEPARIN BOLUS VIA INFUSION
1500.0000 [IU] | Freq: Once | INTRAVENOUS | Status: AC
  Administered 2020-10-15: 1500 [IU] via INTRAVENOUS
  Filled 2020-10-15: qty 1500

## 2020-10-15 MED ORDER — HEPARIN (PORCINE) 25000 UT/250ML-% IV SOLN
1350.0000 [IU]/h | INTRAVENOUS | Status: DC
  Administered 2020-10-15: 1350 [IU]/h via INTRAVENOUS
  Filled 2020-10-15: qty 250

## 2020-10-15 NOTE — ED Notes (Signed)
Pt given decontamination shower.

## 2020-10-15 NOTE — Progress Notes (Signed)
Critical result o2 <32.0  MD notified

## 2020-10-15 NOTE — ED Provider Notes (Addendum)
Signout note  65 year old gentleman who has history of coronary artery disease s/p CABG and PCI, HFpEF, and CKD, admission in August for NSTEMI.   Pt had refused left heart cath and agreed for stress test which came back high risk.  Patient again was recommended to have a left heart cath by cardiology but patient again refused.  Presented back to ER today with concern for new chest pain.  Resolved with nitroglycerin.  EKG negative for STEMI but did have more pronounced T wave inversions in V5 and V6 today.  Initial troponin pending.  7:30 AM f/u trop, consult cardiology  8:05 AM rechecked pt, appears well in no distress, sending second troponin, first trop still in process.  Noted bicarb low, concern for metabolic acidosis, will check lactic acid, VBG, beta hydroxybutyrate acid, blood cultures to further investigate.  No fever no specific infectious complaint, will hold ABX for now.  10:15 AM         Discussed the case with Dr. Rennis Golden with cardiology, he anticipates recommending medical management, they will see as consult   10:50 AM troponin finally resulted, elevated at 902, chest pain with elevated troponin in setting of known coronary disease concerning for ACS, NSTEMI.  Will initiate heparin, notify cardiology, admit to medicine.    Regarding concern for acidosis, lactate is normal, pH is normal on VBG.  Bicarb is low and given the elevated creatinine, suspect more likely dehydration.  We will give gentle bolus given his heart failure history.     CRITICAL CARE Performed by: Milagros Loll   Total critical care time: 48 minutes  Critical care time was exclusive of separately billable procedures and treating other patients.  Critical care was necessary to treat or prevent imminent or life-threatening deterioration.  Critical care was time spent personally by me on the following activities: development of treatment plan with patient and/or surrogate as well as nursing, discussions  with consultants, evaluation of patient's response to treatment, examination of patient, obtaining history from patient or surrogate, ordering and performing treatments and interventions, ordering and review of laboratory studies, ordering and review of radiographic studies, pulse oximetry and re-evaluation of patient's condition.     Milagros Loll, MD 10/15/20 1054

## 2020-10-15 NOTE — ED Notes (Signed)
Attempted to give reportx1 

## 2020-10-15 NOTE — Consult Note (Signed)
Cardiology Consultation:   Patient ID: SERAJ DUNNAM MRN: [id number]; DOB: 10-27-1955  Admit date: 10/15/2020 Date of Consult: 10/15/2020  PCP:  Hoy Register, MD   [facility] Providers Cardiologist:  [name], MD     Patient Profile:   Darrell [name] is a 65 y.o. male with a hx of CAD status post CABG and PCI's, hypertension, hyperlipidemia, diabetes, CKD stage III, chronic diastolic heart failure, bedbug infestation, medical noncompliance who is being seen 10/15/2020 for the evaluation of chest pain at the request of Dr Stevie Kern.  History of Present Illness:   Darrell [name] is a 65 year old male with past medical history noted above.  Underwent CABG in 2009 with LIMA to LAD, left radial to OM, SVG-PDA-PLA, SVG to diagonal.  Underwent subsequent PCI since that time.  Last cardiac catheterization 04/2018 severe in-stent restenosis of 99% in native circumflex with diffuse severe disease in the LAD beyond LIMA insertion that was treated with Cutting Balloon atherectomy.  He was maintained on DAPT with aspirin/Plavix.    Over the past several months he has had multiple admissions for recurrent chest pain.  Admission 05/2020 with elevated troponin felt to be in the setting of accelerated hypertension. Most recent admission 09/04/2020 with NSTEMI.  High-sensitivity troponin trend 340>> 1371>> 1687, with initial recommendation being for cardiac cath which patient refused.  He did agree to stress testing which was noted to be high risk. It was again recommended to the patient that he undergo cardiac catheterization which he adamantly refused.  He was therefore treated medically and strongly encouraged to remain compliant with medications and keep office follow-up.  He was continued on aspirin, Plavix, statin, Norvasc and Coreg.  Follow-up in the office was arranged but he was a no-show.   He currently lives in an apartment and reportedly never leaves the house.  He is very limited in his  mobility.  He does have a son which attempts to help with medical appointments and medication pickups.  He has been evaluated by social work during prior admissions and set up with transportation with [facility], as well as attempts to set up with mail order delivery with medications.  Appears that all efforts have been unsuccessful in this area so far.  Presented to the ED on 9/18 with recurrent chest pain.  Indicates chest pain started about 3 hours prior to EMS arrival early that morning.  He was given sublingual nitro in route with relief of symptoms.  He is also noted to be infested with bedbugs which has been a recurrent issue over previous admissions.  Labs in the ED showed sodium 135, potassium 3.8, creatinine 2.2, magnesium 2, high-sensitivity troponin 685>> 902, WBC 8.6, hemoglobin 8.4, lactic acid 1.4.  COVID-negative.  VBG: Bicarb 17 , pCO2 29, pH 7.3.  EKG on admission showed sinus tachycardia, 114 bpm, nonspecific IVCD, old anterior septal infarct with slight ST depression in lateral leads.  Chest x-ray negative.  He was started on IV heparin, cardiology consulted in regards to chest pain/non-STEMI.  Admitted to teaching service for further management.  Past Medical History:  Diagnosis Date   CKD (chronic kidney disease), stage III (HCC)    Claudication (HCC)    Coronary artery disease    a. 08/2001 s/p DES to RCA/RI;  b. 07/2006 s/p DES to p/d LCX;  c. 11/2007 CABGx5 (LIMA->LAD, VG->D1, LRA->OM1, VG->PDA->LPL); c. 03/2015: Synergy DES to mid-Cx in the setting of NSTEMI; d. 11/2015 NSTEMI->Med Rx. e. NSTEMI 01/2018 -> med rx.  Diabetes mellitus type 2 in nonobese [facility])    a. A1c 7.0 09/2012.   Diastolic dysfunction    a. 02/2016 Echo: EF 50-55%, gr1DD, Ao sclerosis, dil Ao root (72mm), sev dil LA, triv TR, PASP .   Hyperlipidemia    Hypertension    Noncompliance    Right carotid bruit    a. noted 01/2018, will need OP eval when patient complies with follow-up.    Past Surgical  History:  Procedure Laterality Date   CARDIAC CATHETERIZATION N/A 04/06/2015   Procedure: Left Heart Cath and Cors/Grafts Angiography;  Surgeon: Runell Gess, MD;  Location: [facility] INVASIVE CV LAB;  Service: Cardiovascular;  Laterality: N/A;   CARDIAC CATHETERIZATION N/A 04/06/2015   Procedure: Coronary Stent Intervention;  Surgeon: Runell Gess, MD;  2.5 mm x 12 mm long Synergy DES followed by  2.5 mm x 16 mm long Synergy DES     CORONARY ARTERY BYPASS GRAFT  2009   2009 LIMA to LAD, SVG to Diag, SVG to PDA and PL, left radial to OM   CORONARY BALLOON ANGIOPLASTY N/A 05/04/2018   Procedure: CORONARY BALLOON ANGIOPLASTY;  Surgeon: Runell Gess, MD;  Location: [facility] INVASIVE CV LAB;  Service: Cardiovascular;  Laterality: N/A;  Prox CFX   LEFT HEART CATH N/A 10/24/2012   Procedure: LEFT HEART CATH;  Surgeon: Lennette Bihari, MD;  Location: [facility] CATH LAB;  Service: Cardiovascular;  Laterality: N/A;   LEFT HEART CATH AND CORS/GRAFTS ANGIOGRAPHY N/A 05/04/2018   Procedure: LEFT HEART CATH AND CORS/GRAFTS ANGIOGRAPHY;  Surgeon: Runell Gess, MD;  Location: [facility] INVASIVE CV LAB;  Service: Cardiovascular;  Laterality: N/A;     Home Medications:  Prior to Admission medications   Medication Sig Start Date End Date Taking? Authorizing Provider  amLODipine (NORVASC) 10 MG tablet Take 1 tablet (10 mg total) by mouth daily. Please make PCP appointment. 09/06/20   Elgergawy, Leana Roe, MD  aspirin 81 MG EC tablet Take 1 tablet (81 mg total) by mouth daily. Swallow whole. 09/07/20   Elgergawy, Leana Roe, MD  atorvastatin (LIPITOR) 80 MG tablet Take 1 tablet (80 mg total) by mouth every evening. 09/06/20   Elgergawy, Leana Roe, MD  carvedilol (COREG) 25 MG tablet Take 1 tablet (25 mg total) by mouth 2 (two) times daily with a meal. Please make PCP appointment. 09/06/20   Elgergawy, Leana Roe, MD  clopidogrel (PLAVIX) 75 MG tablet Take 1 tablet (75 mg total) by mouth daily. Please make PCP appointment. 09/06/20    Elgergawy, Leana Roe, MD  ferrous sulfate 325 (65 FE) MG tablet Take 1 tablet (325 mg total) by mouth 2 (two) times daily with a meal. 09/06/20 11/05/20  Elgergawy, Leana Roe, MD  hydrALAZINE (APRESOLINE) 50 MG tablet Take 1 tablet (50 mg total) by mouth 3 (three) times daily. 09/06/20   Elgergawy, Leana Roe, MD  isosorbide mononitrate (IMDUR) 60 MG 24 hr tablet Take 2 tablets (120 mg total) by mouth daily. Please make PCP appointment. 09/06/20   Elgergawy, Leana Roe, MD  metFORMIN (GLUCOPHAGE) 500 MG tablet Take 1 tablet (500 mg total) by mouth daily with breakfast. 09/06/20 11/05/20  Elgergawy, Leana Roe, MD  nitroGLYCERIN (NITROSTAT) 0.4 MG SL tablet Place 1 tablet (0.4 mg total) under the tongue every 5 (five) minutes as needed for chest pain. 09/06/20 10/06/20  Elgergawy, Leana Roe, MD  tamsulosin (FLOMAX) 0.4 MG CAPS capsule Take 1 capsule (0.4 mg total) by mouth daily. Please make PCP appointment. 09/06/20  Elgergawy, Leana Roe, MD    Inpatient Medications: Scheduled Meds:  Continuous Infusions:  PRN Meds: nitroGLYCERIN  Allergies:   No Known Allergies  Social History:   Social History   Socioeconomic History   Marital status: Married    Spouse name: Not on file   Number of children: 3   Years of education: Not on file   Highest education level: Not on file  Occupational History   Not on file  Tobacco Use   Smoking status: Never   Smokeless tobacco: Never  Vaping Use   Vaping Use: Never used  Substance and Sexual Activity   Alcohol use: No    Comment: sts he drank for the first time in a long time last night    Drug use: No   Sexual activity: Not on file  Other Topics Concern   Not on file  Social History Narrative   Lives at home with wife.     Social Determinants of Health   Financial Resource Strain: Not on file  Food Insecurity: Not on file  Transportation Needs: Not on file  Physical Activity: Not on file  Stress: Not on file  Social Connections: Not on file  Intimate  Partner Violence: Not on file    Family History:    Family History  Problem Relation Age of Onset   Heart failure Mother    Cancer Mother    CAD Mother 38   Heart failure Father    Kidney failure Brother      ROS:  Please see the history of present illness.   All other ROS reviewed and negative.     Physical Exam/Data:   Vitals:   10/15/20 0900 10/15/20 1000 10/15/20 1030 10/15/20 1100  BP: (!) 159/95 (!) 157/101 (!) 162/97 (!) 165/98  Pulse: (!) 107 (!) 105 (!) 106 (!) 105  Resp: (!) 31 (!) 31 (!) 29 (!) 29  Temp:      TempSrc:      SpO2: 92% 91% 92% 90%  Weight:      Height:       No intake or output data in the 24 hours ending 10/15/20 1125 Last 3 Weights 10/15/2020 09/06/2020 09/05/2020  Weight (lbs) 235 lb 230 lb 4.8 oz 227 lb 11.8 oz  Weight (kg) 106.595 kg 104.463 kg 103.3 kg     Body mass index is 31 kg/m.  General: Thin, disheveled male appearing much older than stated age. HEENT: normal Neck: no JVD Vascular: No carotid bruits; Distal pulses 2+ bilaterally Cardiac:  normal S1, S2; RRR; no murmur  Lungs:  clear to auscultation anteriorly Abd: soft, nontender, no hepatomegaly  Ext: no edema Musculoskeletal:  No deformities, BUE and BLE strength normal and equal Skin: warm and dry  Neuro:  CNs 2-12 intact, no focal abnormalities noted Psych:  Normal affect   EKG:  The EKG was personally reviewed and demonstrates:  Sinus tachycardia, 114 bpm, nonspecific IVCD, old anterior septal infarct with slight ST depression in lateral leads Telemetry:  Telemetry was personally reviewed and demonstrates: Sinus tachycardia  Relevant CV Studies:  Cardiac catheterization: 04/2018  Mid RCA lesion is 100% stenosed. Ost RCA to Prox RCA lesion is 99% stenosed. Ost LAD to Prox LAD lesion is 100% stenosed. Mid Cx to Dist Cx lesion is 100% stenosed. Origin to Prox Graft lesion is 100% stenosed. Mid LAD to Dist LAD lesion is 90% stenosed with 100% stenosed side branch in Ost  3rd Sept. Origin to Prox Graft lesion  is 100% stenosed. Ost Cx to Prox Cx lesion is 99% stenosed. Scoring balloon angioplasty was performed using a BALLOON WOLVERINE 2.75X10. Post intervention, there is a 30% residual stenosis. The left ventricular ejection fraction is 50-55% by visual estimate. LV end diastolic pressure is normal.  IMPRESSION: Darrell [name] had anatomy fairly similar to what it was in 2017.  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  The mid to distal AV groove circumflex stent was occluded at this point whereas 3 years ago it was subtotally occluded.  His distal LAD beyond LIMA insertion was diffusely and severely diseased as well.  I performed Cutting Balloon atherectomy reducing a 99% lesion to less than 30%.  Patient tolerated procedure well.  He was already on aspirin Plavix.  Angiomax was turned off.  He left the lab in stable condition on IV nitroglycerin for blood pressure control.  The sheath will be removed and pressure held.  Patient will be gently hydrated and most likely discharged home tomorrow on aspirin and Plavix.   Nanetta Batty. MD, [facility] 05/04/2018  Diagnostic Dominance: Right Intervention    Echocardiogram: 08/2020  IMPRESSIONS     1. Left ventricular ejection fraction, by estimation, is 45 to 50%. The  left ventricle has mildly decreased function. Left ventricular endocardial  border not optimally defined to evaluate regional wall motion - regional  wall motion abnormalities are  seen. There is severe left ventricular hypertrophy.   2. Right ventricular systolic function is mildly reduced. The right  ventricular size is normal. There is severely elevated pulmonary artery  systolic pressure. The estimated right ventricular systolic pressure is  61.5 mmHg.   3. The mitral valve is grossly normal. Mild to moderate mitral valve  regurgitation.   4. The aortic valve is tricuspid. There  is mild calcification of the  aortic valve. Aortic valve regurgitation is trivial.   5. The inferior vena cava is dilated in size with <50% respiratory  variability, suggesting right atrial pressure of 15 mmHg.   Comparison(s): A prior study was performed on 05/30/20. Prior images  reviewed side by side. Regional wall motion abnormalities have not  significantly changed, however LVEF appears less today, with apical  function appearing less vigorous.   FINDINGS   Left Ventricle: Left ventricular ejection fraction, by estimation, is 45  to 50%. The left ventricle has mildly decreased function. Left ventricular  endocardial border not optimally defined to evaluate regional wall motion.  There is severe left  ventricular hypertrophy. Left ventricular diastolic function could not be  evaluated.      LV Wall Scoring:  The inferior wall, basal anteroseptal segment, mid inferoseptal segment,  and  basal inferoseptal segment are hypokinetic.   Right Ventricle: The right ventricular size is normal. Right ventricular  systolic function is mildly reduced. There is severely elevated pulmonary  artery systolic pressure. The tricuspid regurgitant velocity is 3.41 m/s,  and with an assumed right atrial  pressure of 15 mmHg, the estimated right ventricular systolic pressure is  61.5 mmHg.   Mitral Valve: The mitral valve is grossly normal. Mild to moderate mitral  valve regurgitation.   Tricuspid Valve: The tricuspid valve is normal in structure. Tricuspid  valve regurgitation is mild.   Aortic Valve: The aortic valve is tricuspid. There is mild calcification  of the aortic valve. Aortic valve regurgitation is trivial.   Venous: The inferior vena cava is dilated in size with less than 50%  respiratory variability, suggesting right atrial pressure of 15 mmHg.   Laboratory Data:  High Sensitivity Troponin:   Recent Labs  Lab 10/15/20 0502 10/15/20 0750  TROPONINIHS 685* 902*      Chemistry Recent Labs  Lab 10/15/20 0502 10/15/20 0936  NA 135 138  K 3.8 3.9  CL 98  --   CO2 11*  --   GLUCOSE 179*  --   BUN 26*  --   CREATININE 2.27*  --   CALCIUM 9.7  --   MG 2.0  --   GFRNONAA 31*  --   ANIONGAP 26*  --     No results for input(s): PROT, ALBUMIN, AST, ALT, ALKPHOS, BILITOT in the last 168 hours. Lipids No results for input(s): CHOL, TRIG, HDL, LABVLDL, LDLCALC, CHOLHDL in the last 168 hours.  Hematology Recent Labs  Lab 10/15/20 0502 10/15/20 0936  WBC 8.6  --   RBC 3.81*  --   HGB 8.4* 8.8*  HCT 30.7* 26.0*  MCV 80.6  --   MCH 22.0*  --   MCHC 27.4*  --   RDW 16.8*  --   PLT 292  --    Thyroid No results for input(s): TSH, FREET4 in the last 168 hours.  BNPNo results for input(s): BNP, PROBNP in the last 168 hours.  DDimer No results for input(s): DDIMER in the last 168 hours.   Radiology/Studies:  DG Chest Port 1 View  Result Date: 10/15/2020 CLINICAL DATA:  Chest pain EXAM: PORTABLE CHEST 1 VIEW COMPARISON:  09/03/2020 FINDINGS: 0555 hours. The cardio pericardial silhouette is enlarged. The lungs are clear without focal pneumonia, edema, pneumothorax or pleural effusion. The visualized bony structures of the thorax show no acute abnormality. Telemetry leads overlie the chest. IMPRESSION: No active disease. Electronically Signed   By: Kennith Center M.D.   On: 10/15/2020 06:04     Assessment and Plan:   Darrell [name] is a 65 y.o. male with a hx of CAD status post CABG and PCI's, hypertension, hyperlipidemia, diabetes, CKD stage III, chronic diastolic heart failure, bedbug infestation, medical noncompliance who is being seen 10/15/2020 for the evaluation of chest pain at the request of Dr Stevie Kern.  Chest pain/non-STEMI: Presents again with chest pain responsive to nitro.  High-sensitivity troponin 685>> 902.  EKG with sinus tachycardia, slight ST depression in lateral leads.  As noted above he has had several admissions with chest pain  with elevated troponins with recommendation for cardiac cath in the past which she is adamantly refused.  He has a history consistent with medication noncompliance and has been unable to keep appointments in the office or obtain medication refills.  This is concerning in the event he would need intervention and committed to DAPT.  He is also again uninterested in undergoing any invasive ischemic work-up at the time of interview. Therefore only option is to continue with medical management.  Historically has been on aspirin, Plavix, statin, Norvasc, Coreg and Imdur.  Would again suggest involving case management and social work in order to help patient obtain medication once closer to discharge. --Continue aspirin, statin, beta-blocker, Imdur.  Started on IV heparin in the ED, will continue for now  Hypertension: Blood pressures elevated in the ED.  Suspect he has not been taking his antihypertensives prior to admission. --Would plan to continue Norvasc 10 mg daily, Coreg 25 mg twice daily  Hyperlipidemia: Continue Lipitor 80 mg daily  CKD stage III: Baseline creatinine appears 1.5-1.8, up to 2.27.  Suspect he is dehydrated as well.  Has been ordered for IV fluids while in the ED. --Avoid renal insulting agents  Chronic combined CHF: Echo 08/2020 EF 45 to 50%, severe LVH, mildly reduced RV.  Overall does not appear volume overloaded on exam.  Actually suspect he is somewhat dehydrated. --Monitor closely with IV fluids  Noncompliance: Long history of same.  He has not kept any of his outpatient office appointments.  Historically runs out of medications and does not seek refills, despite assistance from social work and case management.  Bedbug infestation: Per primary  For questions or updates, please contact [facility] Please consult [email or web address] for contact info under    Signed, Laverda Page, NP  10/15/2020 11:25 AM

## 2020-10-15 NOTE — ED Notes (Signed)
Place pt on 3L [place] per Dr. Stevie Kern.

## 2020-10-15 NOTE — H&P (Addendum)
Date: 10/15/2020               Patient Name:  Darrell [name] MRN: [id number]  DOB: 1955/07/13 Age / Sex: 65 y.o., male   PCP: Hoy Register, MD         Medical Service: Internal Medicine Teaching Service         Attending Physician: Dr. Earl Lagos, MD    First Contact: Dr. Rudene Christians Pager: [phone]  Second Contact: Dr. Evlyn Kanner Pager: [phone]       After Hours (After 5p/  First Contact Pager: [phone]  weekends / holidays): Second Contact Pager: [phone]   Chief Complaint: Chest pain  History of Present Illness: Darrell [name] is a 65 y.o. person with medical history significant of CAD s/p CABG (2009) and PCI, recent admission in August for NSTEMI, HFpEF, CKD stage III, Hypertension, diabetes, and Hyperlipidemia who presents with complaints of chest pain that started yesterday evening.  He states that he was sitting down when the pain came on last night. It was centrally located, did not radiate, and felt similar to previous chest pain with his heart attacks.  He took aspirin at that time without improvement and called EMS.  En route to hospital, patient received nitroglycerin with resolution of chest pain.    ED course: troponin elevated at 685-->902, EKG negative for ST elevations, but did show T wave inversions in V5 and V6. Cardiology consulted.   Patient denies current chest pain, fever, chills, shortness of breath, nausea, vomiting, and dysuria.  Meds:  Amlodipine 10 mg Aspirin 81 mg Atorvastatin 80 mg Carvedilol 25 mg Clopidogrel 75 mg Ferrous sulfate 325 mg Hydralazine 50 mg Isosorbide mononitrate 60 mg Nitroglycerin Tamsulosin 0.4 mg   Allergies: Allergies as of 10/15/2020   (No Known Allergies)   Past Medical History:  Diagnosis Date   CKD (chronic kidney disease), stage III (HCC)    Claudication (HCC)    Coronary artery disease    a. 08/2001 s/p DES to RCA/RI;  b. 07/2006 s/p DES to p/d LCX;  c. 11/2007 CABGx5  (LIMA->LAD, VG->D1, LRA->OM1, VG->PDA->LPL); c. 03/2015: Synergy DES to mid-Cx in the setting of NSTEMI; d. 11/2015 NSTEMI->Med Rx. e. NSTEMI 01/2018 -> med rx.   Diabetes mellitus type 2 in nonobese [facility])    a. A1c 7.0 09/2012.   Diastolic dysfunction    a. 02/2016 Echo: EF 50-55%, gr1DD, Ao sclerosis, dil Ao root (43mm), sev dil LA, triv TR, PASP .   Hyperlipidemia    Hypertension    Noncompliance    Right carotid bruit    a. noted 01/2018, will need OP eval when patient complies with follow-up.    Family History: Mother- Heart failure and CAD, deceased       Father- Heart failure, deceased  Social History: Patient lives at home with his wife.  At baseline, he is able to complete his ADLs.  He enjoys watching NFL games and playing a NFL video game.    Review of Systems: A complete ROS was negative except as per HPI.   Physical Exam: Blood pressure (!) 151/98, pulse (!) 111, temperature 97.6 F (36.4 C), temperature source Oral, resp. rate (!) 23, height 6\' 1"  (1.854 m), weight 106.6 kg, SpO2 90 %. General: well-nourished, appears disheveled with long hair and long beard HENT: NCAT, dry mucous membranes Eyes: no scleral icterus, conjunctiva clear CV:No murmurs, rubs, gallops Pulm: CTAB, pulmonary effort normal GI:no tenderness, bowel sounds present, abdomen nondistended MSK:  No peripheral edema in legs bilaterally,  Skin: multiple reddish papules along arms and back, warm and dry Psych: normal mood and affect, awake and oriented x3  EKG: personally reviewed my interpretation is sinus tachycardia, ST depression in leads V5 and V6  Assessment & Plan by Problem: Active Problems:   NSTEMI (non-ST elevated myocardial infarction) (HCC)  Darrell [name] is a 65 y.o. person with medical history significant of CAD s/p CABG (2009) and PCI, recent admission in August for NSTEMI, HFpEF, CKD stage III, Hypertension, diabetes, and Hyperlipidemia who presents with complaints of chest  pain that started yesterday evening and was admitted due to NSTEMI.  #NSTEMI Patient with history of coronary artery disease status post CABG and PCI's in 2009 presents due to chest pain.  Noted to have several emergency room visits recently for recurrent chest pain including admissions and NSTEMI thought to be due to uncontrolled hypertension.  He has had troponins up to 1600 in the past.  He has previously been recommended to have a cardiac catheterization, but he declined it.  He underwent a stress test which was considered high risk due to left ventricular ejection fraction of 31%, which showed fixed defect suggestive of a scar.  On chart review, patient has had difficulty with medication adherence in the past.  Last cardiac catheterization in April 2020 showed severe in-stent restenosis of 99% and native circumflex with diffuse severe disease in the LAD beyond LIMA insertion that was treated with the balloon arthrectomy.  EKG showed sinus tachycardia with ST depression in leads V5 and V6.  Troponin positive at 685-->905.  Cardiology was consulted and their recommendations were for medical management due to patient declining cardiac catheterization and being poorly adherent with antiplatelet therapy in the past.  Heparin was initiated. Will restart  goal directed medical therapy.   -Heparin initiated, will continue for 48 hours -CBC -Cardiology following -Restart Aspirin 81 mg, Atorvastatin 80 mg, Carvedilol 25 mg, Imdur 60 mg  #Acute Hypoxic Respiratory Failure Patient is not on oxygen at home.On exam he was saturating well with 2 L.  His initial VBG showed pH of 7.371, PCO2 of 29.8,pO2 of 44 and bicarb of 17.3.  Repeat VBG showed pH of 7.336, pCO2 of 38, PO2 of <32, and Bicarb of 19.7.  Wells criteria of 3 due to persistent tachycardia and immobilization at home.  We will proceed with D-dimer. -Follow-up on D-dimer, will order CTA if elevated  #Chronic combined CHF Echo 08/2020 showed EF of 45%  to 50%, severe LVH, mildly reduced RV.  He appears euvolemic on exam. -monitor closely with IV fluids given  #CKD stage III Creatinine 2.27 on admission.  Baseline is 1.5-1.8.  Patient appears  -Careful fluid repletion in setting of HFrEF -avoid nephrotoxic agents  #Hypertension He was hypertensive in the ED, in the 160s/100s.  Will restart home medications. -restarted Amlodipine 10 mg and Carvedilol 25 mg  #Hyperlipidemia -restarted Atorvastatin 80 mg   Dispo: Admit patient to Observation with expected length of stay less than 2 midnights.  Signed: Rudene Christians, DO 10/15/2020, 5:10 PM  Pager: [phone] After 5pm on weekdays and 1pm on weekends: On Call pager: [phone]

## 2020-10-15 NOTE — Progress Notes (Signed)
ANTICOAGULATION CONSULT NOTE - Initial Consult  Pharmacy Consult for heparin Indication: chest pain/ACS  No Known Allergies  Patient Measurements: Height: 6\' 1"  (185.4 cm) Weight: 106.6 kg (235 lb) IBW/kg (Calculated) : 79.9 Heparin Dosing Weight: 102 kg   Vital Signs: Temp: 98 F (36.7 C) (09/18 0502) Temp Source: Oral (09/18 0502) BP: 165/98 (09/18 1100) Pulse Rate: 105 (09/18 1100)  Labs: Recent Labs    10/15/20 0502 10/15/20 0750 10/15/20 0927 10/15/20 0936  HGB 8.4*  --   --  8.8*  HCT 30.7*  --   --  26.0*  PLT 292  --   --   --   LABPROT  --   --  15.3*  --   INR  --   --  1.2  --   CREATININE 2.27*  --   --   --   TROPONINIHS 685* 902*  --   --     Estimated Creatinine Clearance: 42.1 mL/min (A) (by C-G formula based on SCr of 2.27 mg/dL (H)).   Medical History: Past Medical History:  Diagnosis Date   CKD (chronic kidney disease), stage III (HCC)    Claudication (HCC)    Coronary artery disease    a. 08/2001 s/p DES to RCA/RI;  b. 07/2006 s/p DES to p/d LCX;  c. 11/2007 CABGx5 (LIMA->LAD, VG->D1, LRA->OM1, VG->PDA->LPL); c. 03/2015: Synergy DES to mid-Cx in the setting of NSTEMI; d. 11/2015 NSTEMI->Med Rx. e. NSTEMI 01/2018 -> med rx.   Diabetes mellitus type 2 in nonobese [facility])    a. A1c 7.0 09/2012.   Diastolic dysfunction    a. 02/2016 Echo: EF 50-55%, gr1DD, Ao sclerosis, dil Ao root (67mm), sev dil LA, triv TR, PASP 59m.   Hyperlipidemia    Hypertension    Noncompliance    Right carotid bruit    a. noted 01/2018, will need OP eval when patient complies with follow-up.    Medications:  (Not in a hospital admission)   Assessment: 78 YOM with recent NSTEMI but declined cardiac cath presents today with central chest pain. Troponins elevated. H/H low. SCr 2.27   Goal of Therapy:  Heparin level 0.3-0.7 units/ml Monitor platelets by anticoagulation protocol: Yes   Plan:  -Heparin 4000 units IV bolus followed by heparin infusion at 1350 units/hr   -F/u 6 hr HL -Monitor daily HL, CBC and s/s of bleeding   78, PharmD., BCPS, BCCCP Clinical Pharmacist Please refer to [facility] for unit-specific pharmacist

## 2020-10-15 NOTE — ED Triage Notes (Signed)
Pt comes via [facility] for CP that started 3 hours ago, given one nitro PTA with relief. Denies other cardiac symptoms, pt is infested with bed bugs

## 2020-10-15 NOTE — Progress Notes (Signed)
ANTICOAGULATION CONSULT NOTE - Follow-Up Consult  Pharmacy Consult for heparin Indication: chest pain/ACS  No Known Allergies  Patient Measurements: Height: 6\' 1"  (185.4 cm) Weight: 98.2 kg (216 lb 7.9 oz) IBW/kg (Calculated) : 79.9 Heparin Dosing Weight: 98 kg   Vital Signs: Temp: 97.7 F (36.5 C) (09/18 1700) Temp Source: Oral (09/18 1700) BP: 167/99 (09/18 1700) Pulse Rate: 110 (09/18 1700)  Labs: Recent Labs    10/15/20 0502 10/15/20 0750 10/15/20 0927 10/15/20 0936  HGB 8.4*  --   --  8.8*  HCT 30.7*  --   --  26.0*  PLT 292  --   --   --   LABPROT  --   --  15.3*  --   INR  --   --  1.2  --   CREATININE 2.27*  --   --   --   TROPONINIHS 685* 902*  --   --      Estimated Creatinine Clearance: 40.5 mL/min (A) (by C-G formula based on SCr of 2.27 mg/dL (H)).  Heparin Infusion: 1350 units/hr  Assessment: 68 YOM with recent NSTEMI but declined cardiac cath presents today with central chest pain. Troponins elevated. Pharmacy consulted to dose heparin for anticoagulation.  Heparin level this evening is SUBtherapeutic (HL 0.24, goal of 0.3-0.7). No bleeding or infusion issues noted per discussion with RN.   Goal of Therapy:  Heparin level 0.3-0.7 units/ml Monitor platelets by anticoagulation protocol: Yes   Plan:  - Heparin bolus 1500 unit x 1 - Increase Heparin to 1550 units/hr (15.5 ml/hr) - Will continue to monitor for any signs/symptoms of bleeding and will follow up with heparin level in 6 hours   Thank you for allowing pharmacy to be a part of this patient's care.  78, PharmD, BCPS Clinical Pharmacist Clinical phone for 10/15/2020: [phone] 10/15/2020 6:14 PM   **Pharmacist phone directory can now be found on [email or web address] ([email or web address]).  Listed under [facility].

## 2020-10-15 NOTE — ED Provider Notes (Signed)
[facility] EMERGENCY DEPARTMENT Provider Note   CSN: [id number] Arrival date & time: 10/15/20  0429     History Chief Complaint  Patient presents with   Chest Pain    Darrell [name] is a 65 y.o. male.  Darrell [name] presented by EMS from home.  About a month ago, he had an NSTEMI.  Cardiac catheterization was recommended, but the patient declined.  Reports that at 7 PM yesterday evening, he began to have sharp central chest pain.  It was relieved by nitroglycerin per EMS but was exacerbated when he took a shower here in the emergency department to remove bedbugs that were present on him.  He was also given aspirin per EMS.  He states that his hesitancy regarding the catheterization was due to concerns about being numb.  He states that he does not want to feel the needle stick in his groin.   Chest Pain Associated symptoms: no abdominal pain, no back pain, no cough, no fever, no palpitations, no shortness of breath and no vomiting    HPI: A 64 year old patient with a history of treated diabetes, hypertension, hypercholesterolemia and obesity presents for evaluation of chest pain. Initial onset of pain was more than 6 hours ago. The patient's chest pain is sharp and is worse with exertion. The patient's chest pain is middle- or left-sided, is not well-localized, is not described as heaviness/pressure/tightness and does not radiate to the arms/jaw/neck. The patient does not complain of nausea and denies diaphoresis. The patient has no history of stroke, has no history of peripheral artery disease, has not smoked in the past 90 days and has no relevant family history of coronary artery disease (first degree relative at less than age 42).   Past Medical History:  Diagnosis Date   CKD (chronic kidney disease), stage III (HCC)    Claudication (HCC)    Coronary artery disease    a. 08/2001 s/p DES to RCA/RI;  b. 07/2006 s/p DES to p/d LCX;  c. 11/2007 CABGx5 (LIMA->LAD,  VG->D1, LRA->OM1, VG->PDA->LPL); c. 03/2015: Synergy DES to mid-Cx in the setting of NSTEMI; d. 11/2015 NSTEMI->Med Rx. e. NSTEMI 01/2018 -> med rx.   Diabetes mellitus type 2 in nonobese [facility])    a. A1c 7.0 09/2012.   Diastolic dysfunction    a. 02/2016 Echo: EF 50-55%, gr1DD, Ao sclerosis, dil Ao root (68mm), sev dil LA, triv TR, PASP .   Hyperlipidemia    Hypertension    Noncompliance    Right carotid bruit    a. noted 01/2018, will need OP eval when patient complies with follow-up.    Patient Active Problem List   Diagnosis Date Noted   Hematuria    Bleeding gums 08/01/2018   Ischemic chest pain (HCC) 07/31/2018   Renal insufficiency    ACS (acute coronary syndrome) (HCC) 05/03/2018   Mild anemia 02/08/2018   Acute kidney injury superimposed on chronic kidney disease (HCC)    Diabetes mellitus type 2 in nonobese (HCC)    Hyperlipidemia with target LDL less than 70    Hypertension    Right carotid bruit    Sepsis (HCC) 03/02/2016   Chest pain    Hyponatremia 01/03/2016   Hypertensive emergency    Non-ST elevation (NSTEMI) myocardial infarction (HCC) 04/04/2015   HTN (hypertension) 04/04/2015   Elevated troponin    Hypertensive urgency 03/25/2014   History of noncompliance with medical treatment, presenting hazards to health 03/25/2014   Claudication (HCC) 10/28/2012   GERD  10/23/2007   NEPHROLITHIASIS 10/23/2007   HYPERCHOLESTEROLEMIA 10/22/2007   ADJUSTMENT DISORDER WITH DEPRESSED MOOD 10/22/2007   Type 2 diabetes mellitus without complication, without long-term current use of insulin (HCC) 01/29/2003   Coronary atherosclerosis, s/p CABG in 2009  01/28/2001    Past Surgical History:  Procedure Laterality Date   CARDIAC CATHETERIZATION N/A 04/06/2015   Procedure: Left Heart Cath and Cors/Grafts Angiography;  Surgeon: Runell Gess, MD;  Location: [facility] INVASIVE CV LAB;  Service: Cardiovascular;  Laterality: N/A;   CARDIAC CATHETERIZATION N/A 04/06/2015    Procedure: Coronary Stent Intervention;  Surgeon: Runell Gess, MD;  2.5 mm x 12 mm long Synergy DES followed by  2.5 mm x 16 mm long Synergy DES     CORONARY ARTERY BYPASS GRAFT  2009   2009 LIMA to LAD, SVG to Diag, SVG to PDA and PL, left radial to OM   CORONARY BALLOON ANGIOPLASTY N/A 05/04/2018   Procedure: CORONARY BALLOON ANGIOPLASTY;  Surgeon: Runell Gess, MD;  Location: [facility] INVASIVE CV LAB;  Service: Cardiovascular;  Laterality: N/A;  Prox CFX   LEFT HEART CATH N/A 10/24/2012   Procedure: LEFT HEART CATH;  Surgeon: Lennette Bihari, MD;  Location: [facility] CATH LAB;  Service: Cardiovascular;  Laterality: N/A;   LEFT HEART CATH AND CORS/GRAFTS ANGIOGRAPHY N/A 05/04/2018   Procedure: LEFT HEART CATH AND CORS/GRAFTS ANGIOGRAPHY;  Surgeon: Runell Gess, MD;  Location: [facility] INVASIVE CV LAB;  Service: Cardiovascular;  Laterality: N/A;       Family History  Problem Relation Age of Onset   Heart failure Mother    Cancer Mother    CAD Mother 59   Heart failure Father    Kidney failure Brother     Social History   Tobacco Use   Smoking status: Never   Smokeless tobacco: Never  Vaping Use   Vaping Use: Never used  Substance Use Topics   Alcohol use: No    Comment: sts he drank for the first time in a long time last night    Drug use: No    Home Medications Prior to Admission medications   Medication Sig Start Date End Date Taking? Authorizing Provider  amLODipine (NORVASC) 10 MG tablet Take 1 tablet (10 mg total) by mouth daily. Please make PCP appointment. 09/06/20   Elgergawy, Leana Roe, MD  aspirin 81 MG EC tablet Take 1 tablet (81 mg total) by mouth daily. Swallow whole. 09/07/20   Elgergawy, Leana Roe, MD  atorvastatin (LIPITOR) 80 MG tablet Take 1 tablet (80 mg total) by mouth every evening. 09/06/20   Elgergawy, Leana Roe, MD  carvedilol (COREG) 25 MG tablet Take 1 tablet (25 mg total) by mouth 2 (two) times daily with a meal. Please make PCP appointment. 09/06/20    Elgergawy, Leana Roe, MD  clopidogrel (PLAVIX) 75 MG tablet Take 1 tablet (75 mg total) by mouth daily. Please make PCP appointment. 09/06/20   Elgergawy, Leana Roe, MD  ferrous sulfate 325 (65 FE) MG tablet Take 1 tablet (325 mg total) by mouth 2 (two) times daily with a meal. 09/06/20 11/05/20  Elgergawy, Leana Roe, MD  hydrALAZINE (APRESOLINE) 50 MG tablet Take 1 tablet (50 mg total) by mouth 3 (three) times daily. 09/06/20   Elgergawy, Leana Roe, MD  isosorbide mononitrate (IMDUR) 60 MG 24 hr tablet Take 2 tablets (120 mg total) by mouth daily. Please make PCP appointment. 09/06/20   Elgergawy, Leana Roe, MD  metFORMIN (GLUCOPHAGE) 500 MG tablet Take 1  tablet (500 mg total) by mouth daily with breakfast. 09/06/20 11/05/20  Elgergawy, Leana Roe, MD  nitroGLYCERIN (NITROSTAT) 0.4 MG SL tablet Place 1 tablet (0.4 mg total) under the tongue every 5 (five) minutes as needed for chest pain. 09/06/20 10/06/20  Elgergawy, Leana Roe, MD  tamsulosin (FLOMAX) 0.4 MG CAPS capsule Take 1 capsule (0.4 mg total) by mouth daily. Please make PCP appointment. 09/06/20   Elgergawy, Leana Roe, MD    Allergies    Patient has no known allergies.  Review of Systems   Review of Systems  Constitutional:  Negative for chills and fever.  HENT:  Negative for ear pain and sore throat.   Eyes:  Negative for pain and visual disturbance.  Respiratory:  Negative for cough and shortness of breath.   Cardiovascular:  Positive for chest pain. Negative for palpitations.  Gastrointestinal:  Negative for abdominal pain and vomiting.  Genitourinary:  Negative for dysuria and hematuria.  Musculoskeletal:  Negative for arthralgias and back pain.  Skin:  Negative for color change and rash.  Neurological:  Negative for seizures and syncope.  All other systems reviewed and are negative.  Physical Exam Updated Vital Signs BP (!) 165/95 (BP Location: Right Arm)   Pulse (!) 115   Temp 98 F (36.7 C) (Oral)   Resp 20   Ht 6\' 1"  (1.854 m)   Wt  106.6 kg   SpO2 100%   BMI 31.00 kg/m   Physical Exam Vitals and nursing note reviewed.  Constitutional:      Appearance: Normal appearance.  HENT:     Head: Normocephalic and atraumatic.  Cardiovascular:     Rate and Rhythm: Regular rhythm. Tachycardia present.     Heart sounds: Normal heart sounds.  Pulmonary:     Effort: Pulmonary effort is normal.     Breath sounds: Normal breath sounds.  Abdominal:     General: There is no distension.     Tenderness: There is no abdominal tenderness. There is no guarding.  Musculoskeletal:     Cervical back: Normal range of motion.     Right lower leg: No edema.     Left lower leg: No edema.  Skin:    General: Skin is warm and dry.  Neurological:     General: No focal deficit present.     Mental Status: He is alert and oriented to person, place, and time.  Psychiatric:        Mood and Affect: Mood normal.        Behavior: Behavior normal.    ED Results / Procedures / Treatments   Labs (all labs ordered are listed, but only abnormal results are displayed) Labs Reviewed  BASIC METABOLIC PANEL - Abnormal; Notable for the following components:      Result Value   CO2 11 (*)    Glucose, Bld 179 (*)    BUN 26 (*)    Creatinine, Ser 2.27 (*)    GFR, Estimated 31 (*)    Anion gap 26 (*)    All other components within normal limits  CBC - Abnormal; Notable for the following components:   RBC 3.81 (*)    Hemoglobin 8.4 (*)    HCT 30.7 (*)    MCH 22.0 (*)    MCHC 27.4 (*)    RDW 16.8 (*)    All other components within normal limits  BLOOD GAS, VENOUS - Abnormal; Notable for the following components:   pCO2, Ven 38.0 (*)  pO2, Ven <32.0 (*)    Bicarbonate 19.7 (*)    Acid-base deficit 5.1 (*)    All other components within normal limits  BETA-HYDROXYBUTYRIC ACID - Abnormal; Notable for the following components:   Beta-Hydroxybutyric Acid 5.31 (*)    All other components within normal limits  PROTIME-INR - Abnormal; Notable  for the following components:   Prothrombin Time 15.3 (*)    All other components within normal limits  SALICYLATE LEVEL - Abnormal; Notable for the following components:   Salicylate Lvl <7.0 (*)    All other components within normal limits  ACETAMINOPHEN LEVEL - Abnormal; Notable for the following components:   Acetaminophen (Tylenol), Serum <10 (*)    All other components within normal limits  HEPARIN LEVEL (UNFRACTIONATED) - Abnormal; Notable for the following components:   Heparin Unfractionated 0.24 (*)    All other components within normal limits  CBC - Abnormal; Notable for the following components:   RBC 3.32 (*)    Hemoglobin 7.3 (*)    HCT 25.2 (*)    MCV 75.9 (*)    MCH 22.0 (*)    MCHC 29.0 (*)    RDW 17.0 (*)    All other components within normal limits  BASIC METABOLIC PANEL - Abnormal; Notable for the following components:   Sodium 134 (*)    Glucose, Bld 152 (*)    Creatinine, Ser 1.47 (*)    Calcium 8.6 (*)    GFR, Estimated 53 (*)    All other components within normal limits  D-DIMER, QUANTITATIVE - Abnormal; Notable for the following components:   D-Dimer, Quant 1.07 (*)    All other components within normal limits  HEPARIN LEVEL (UNFRACTIONATED) - Abnormal; Notable for the following components:   Heparin Unfractionated <0.10 (*)    All other components within normal limits  GLUCOSE, CAPILLARY - Abnormal; Notable for the following components:   Glucose-Capillary 148 (*)    All other components within normal limits  CBC - Abnormal; Notable for the following components:   RBC 3.24 (*)    Hemoglobin 7.1 (*)    HCT 24.9 (*)    MCV 76.9 (*)    MCH 21.9 (*)    MCHC 28.5 (*)    RDW 16.9 (*)    nRBC 0.7 (*)    All other components within normal limits  BASIC METABOLIC PANEL - Abnormal; Notable for the following components:   Potassium 3.3 (*)    Glucose, Bld 195 (*)    Creatinine, Ser 1.75 (*)    Calcium 7.8 (*)    GFR, Estimated 43 (*)    All other  components within normal limits  MAGNESIUM - Abnormal; Notable for the following components:   Magnesium 1.6 (*)    All other components within normal limits  BASIC METABOLIC PANEL - Abnormal; Notable for the following components:   Potassium 3.4 (*)    Glucose, Bld 118 (*)    Creatinine, Ser 2.26 (*)    Calcium 8.2 (*)    GFR, Estimated 32 (*)    All other components within normal limits  GLUCOSE, CAPILLARY - Abnormal; Notable for the following components:   Glucose-Capillary 135 (*)    All other components within normal limits  CBC - Abnormal; Notable for the following components:   RBC 3.94 (*)    Hemoglobin 9.2 (*)    HCT 30.3 (*)    MCV 76.9 (*)    MCH 23.4 (*)    RDW 17.2 (*)  nRBC 0.3 (*)    All other components within normal limits  BASIC METABOLIC PANEL - Abnormal; Notable for the following components:   Potassium 3.4 (*)    Glucose, Bld 127 (*)    BUN 26 (*)    Creatinine, Ser 2.64 (*)    Calcium 8.2 (*)    GFR, Estimated 26 (*)    All other components within normal limits  MAGNESIUM - Abnormal; Notable for the following components:   Magnesium 2.6 (*)    All other components within normal limits  GLUCOSE, CAPILLARY - Abnormal; Notable for the following components:   Glucose-Capillary 119 (*)    All other components within normal limits  GLUCOSE, CAPILLARY - Abnormal; Notable for the following components:   Glucose-Capillary 129 (*)    All other components within normal limits  GLUCOSE, CAPILLARY - Abnormal; Notable for the following components:   Glucose-Capillary 113 (*)    All other components within normal limits  HEPARIN LEVEL (UNFRACTIONATED) - Abnormal; Notable for the following components:   Heparin Unfractionated 0.27 (*)    All other components within normal limits  GLUCOSE, CAPILLARY - Abnormal; Notable for the following components:   Glucose-Capillary 145 (*)    All other components within normal limits  CBC - Abnormal; Notable for the  following components:   RBC 3.90 (*)    Hemoglobin 9.1 (*)    HCT 30.4 (*)    MCV 77.9 (*)    MCH 23.3 (*)    MCHC 29.9 (*)    RDW 18.3 (*)    All other components within normal limits  BASIC METABOLIC PANEL - Abnormal; Notable for the following components:   Glucose, Bld 102 (*)    BUN 34 (*)    Creatinine, Ser 3.47 (*)    Calcium 8.2 (*)    GFR, Estimated 19 (*)    All other components within normal limits  GLUCOSE, CAPILLARY - Abnormal; Notable for the following components:   Glucose-Capillary 137 (*)    All other components within normal limits  GLUCOSE, CAPILLARY - Abnormal; Notable for the following components:   Glucose-Capillary 129 (*)    All other components within normal limits  GLUCOSE, CAPILLARY - Abnormal; Notable for the following components:   Glucose-Capillary 126 (*)    All other components within normal limits  BASIC METABOLIC PANEL - Abnormal; Notable for the following components:   BUN 33 (*)    Creatinine, Ser 3.33 (*)    Calcium 8.3 (*)    GFR, Estimated 20 (*)    All other components within normal limits  MAGNESIUM - Abnormal; Notable for the following components:   Magnesium 2.5 (*)    All other components within normal limits  CBC - Abnormal; Notable for the following components:   WBC 12.0 (*)    Hemoglobin 10.1 (*)    HCT 34.3 (*)    MCV 79.0 (*)    MCH 23.3 (*)    MCHC 29.4 (*)    RDW 18.1 (*)    All other components within normal limits  GLUCOSE, CAPILLARY - Abnormal; Notable for the following components:   Glucose-Capillary 114 (*)    All other components within normal limits  GLUCOSE, CAPILLARY - Abnormal; Notable for the following components:   Glucose-Capillary 126 (*)    All other components within normal limits  GLUCOSE, CAPILLARY - Abnormal; Notable for the following components:   Glucose-Capillary 123 (*)    All other components within normal limits  GLUCOSE,  CAPILLARY - Abnormal; Notable for the following components:    Glucose-Capillary 129 (*)    All other components within normal limits  GLUCOSE, CAPILLARY - Abnormal; Notable for the following components:   Glucose-Capillary 122 (*)    All other components within normal limits  CBC - Abnormal; Notable for the following components:   RBC 4.03 (*)    Hemoglobin 9.3 (*)    HCT 32.2 (*)    MCV 79.9 (*)    MCH 23.1 (*)    MCHC 28.9 (*)    RDW 18.6 (*)    All other components within normal limits  BASIC METABOLIC PANEL - Abnormal; Notable for the following components:   Potassium 3.1 (*)    Glucose, Bld 118 (*)    BUN 38 (*)    Creatinine, Ser 3.72 (*)    Calcium 8.3 (*)    GFR, Estimated 17 (*)    All other components within normal limits  GLUCOSE, CAPILLARY - Abnormal; Notable for the following components:   Glucose-Capillary 123 (*)    All other components within normal limits  GLUCOSE, CAPILLARY - Abnormal; Notable for the following components:   Glucose-Capillary 122 (*)    All other components within normal limits  GLUCOSE, CAPILLARY - Abnormal; Notable for the following components:   Glucose-Capillary 128 (*)    All other components within normal limits  GLUCOSE, CAPILLARY - Abnormal; Notable for the following components:   Glucose-Capillary 121 (*)    All other components within normal limits  I-STAT VENOUS BLOOD GAS, ED - Abnormal; Notable for the following components:   pCO2, Ven 29.8 (*)    Bicarbonate 17.3 (*)    TCO2 18 (*)    Acid-base deficit 7.0 (*)    HCT 26.0 (*)    Hemoglobin 8.8 (*)    All other components within normal limits  POCT I-STAT 7, (LYTES, BLD GAS, ICA,H+H) - Abnormal; Notable for the following components:   pH, Arterial 7.563 (*)    pCO2 arterial 30.9 (*)    pO2, Arterial 438 (*)    Acid-Base Excess 5.0 (*)    Calcium, Ion 1.10 (*)    HCT 20.0 (*)    Hemoglobin 6.8 (*)    All other components within normal limits  TROPONIN I (HIGH SENSITIVITY) - Abnormal; Notable for the following components:    Troponin I (High Sensitivity) 685 (*)    All other components within normal limits  TROPONIN I (HIGH SENSITIVITY) - Abnormal; Notable for the following components:   Troponin I (High Sensitivity) 902 (*)    All other components within normal limits  TROPONIN I (HIGH SENSITIVITY) - Abnormal; Notable for the following components:   Troponin I (High Sensitivity) 6,892 (*)    All other components within normal limits  TROPONIN I (HIGH SENSITIVITY) - Abnormal; Notable for the following components:   Troponin I (High Sensitivity) 6,596 (*)    All other components within normal limits  RESP PANEL BY RT-PCR (FLU A&B, COVID) ARPGX2  CULTURE, BLOOD (ROUTINE X 2)  CULTURE, BLOOD (ROUTINE X 2)  MRSA NEXT GEN BY PCR, NASAL  ETHANOL  LACTIC ACID, PLASMA  MAGNESIUM  HEPARIN LEVEL (UNFRACTIONATED)  MAGNESIUM  LACTIC ACID, PLASMA  LACTIC ACID, PLASMA  HEPARIN LEVEL (UNFRACTIONATED)  HEPARIN LEVEL (UNFRACTIONATED)  MAGNESIUM  PREPARE RBC (CROSSMATCH)  TYPE AND SCREEN    EKG EKG Interpretation  Date/Time:  Sunday October 15 2020 04:57:57 EDT Ventricular Rate:  114 PR Interval:  169 QRS Duration: 122  QT Interval:  350 QTC Calculation: 482 R Axis:   100 Text Interpretation: Sinus tachycardia Nonspecific intraventricular conduction delay Anteroseptal infarct, old Borderline ST depression, lateral leads More pronounced T wave inversions in V5, V6 when compared to EK from 09/04/20 Confirmed by Pieter Partridge ([id number]) on 10/15/2020 5:03:40 AM  Radiology DG Chest Port 1 View  Result Date: 10/15/2020 CLINICAL DATA:  Chest pain EXAM: PORTABLE CHEST 1 VIEW COMPARISON:  09/03/2020 FINDINGS: 0555 hours. The cardio pericardial silhouette is enlarged. The lungs are clear without focal pneumonia, edema, pneumothorax or pleural effusion. The visualized bony structures of the thorax show no acute abnormality. Telemetry leads overlie the chest. IMPRESSION: No active disease. Electronically Signed   By: Kennith Center M.D.   On: 10/15/2020 06:04    Procedures Procedures   Medications Ordered in ED Medications  nitroGLYCERIN (NITROSTAT) SL tablet 0.4 mg (0.4 mg Sublingual Given 10/15/20 2595)    ED Course  I have reviewed the triage vital signs and the nursing notes.  Pertinent labs & imaging results that were available during my care of the patient were reviewed by me and considered in my medical decision making (see chart for details).    MDM Rules/Calculators/A&P HEAR Score: 4                         Darrell [name] presented to the emergency department with chest pain.  He has a history of CAD and had an NSTEMI a little over a month ago.  I am highly suspicious that this is happening today.  He previously refused cardiac catheterization but seems to be agreeable to the plan if needed.  Currently awaiting troponin.  Hemoglobin a little bit over 8 which appears roughly consistent with past baseline values. Final Clinical Impression(s) / ED Diagnoses Final diagnoses:  Chest pain, unspecified type  Anemia, unspecified type    Rx / DC Orders ED Discharge Orders     None        Koleen Distance, MD 10/19/20 [phone]

## 2020-10-16 ENCOUNTER — Inpatient Hospital Stay (HOSPITAL_COMMUNITY): Payer: Medicare Other

## 2020-10-16 ENCOUNTER — Inpatient Hospital Stay (HOSPITAL_COMMUNITY): Payer: Medicare Other | Admitting: Certified Registered Nurse Anesthetist

## 2020-10-16 DIAGNOSIS — I472 Ventricular tachycardia, unspecified: Secondary | ICD-10-CM

## 2020-10-16 DIAGNOSIS — I469 Cardiac arrest, cause unspecified: Secondary | ICD-10-CM | POA: Diagnosis not present

## 2020-10-16 DIAGNOSIS — F4321 Adjustment disorder with depressed mood: Secondary | ICD-10-CM | POA: Diagnosis not present

## 2020-10-16 DIAGNOSIS — I214 Non-ST elevation (NSTEMI) myocardial infarction: Secondary | ICD-10-CM | POA: Diagnosis not present

## 2020-10-16 DIAGNOSIS — Z9119 Patient's noncompliance with other medical treatment and regimen: Secondary | ICD-10-CM | POA: Diagnosis not present

## 2020-10-16 LAB — BASIC METABOLIC PANEL
Anion gap: 5 (ref 5–15)
Anion gap: 7 (ref 5–15)
Anion gap: 5 (ref 5–15)
BUN: 22 mg/dL (ref 8–23)
BUN: 23 mg/dL (ref 8–23)
BUN: 23 mg/dL (ref 8–23)
CO2: 27 mmol/L (ref 22–32)
CO2: 24 mmol/L (ref 22–32)
CO2: 26 mmol/L (ref 22–32)
Calcium: 8.6 mg/dL — ABNORMAL LOW (ref 8.9–10.3)
Calcium: 7.8 mg/dL — ABNORMAL LOW (ref 8.9–10.3)
Calcium: 8.2 mg/dL — ABNORMAL LOW (ref 8.9–10.3)
Chloride: 102 mmol/L (ref 98–111)
Chloride: 104 mmol/L (ref 98–111)
Chloride: 106 mmol/L (ref 98–111)
Creatinine, Ser: 1.47 mg/dL — ABNORMAL HIGH (ref 0.61–1.24)
Creatinine, Ser: 1.75 mg/dL — ABNORMAL HIGH (ref 0.61–1.24)
Creatinine, Ser: 2.26 mg/dL — ABNORMAL HIGH (ref 0.61–1.24)
GFR, Estimated: 53 mL/min — ABNORMAL LOW (ref >60)
GFR, Estimated: 43 mL/min — ABNORMAL LOW (ref >60)
GFR, Estimated: 32 mL/min — ABNORMAL LOW (ref >60)
Glucose, Bld: 152 mg/dL — ABNORMAL HIGH (ref 70–99)
Glucose, Bld: 195 mg/dL — ABNORMAL HIGH (ref 70–99)
Glucose, Bld: 118 mg/dL — ABNORMAL HIGH (ref 70–99)
Potassium: 3.6 mmol/L (ref 3.5–5.1)
Potassium: 3.3 mmol/L — ABNORMAL LOW (ref 3.5–5.1)
Potassium: 3.4 mmol/L — ABNORMAL LOW (ref 3.5–5.1)
Sodium: 134 mmol/L — ABNORMAL LOW (ref 135–145)
Sodium: 135 mmol/L (ref 135–145)
Sodium: 137 mmol/L (ref 135–145)

## 2020-10-16 LAB — POCT I-STAT 7, (LYTES, BLD GAS, ICA,H+H)
Acid-Base Excess: 5 mmol/L — ABNORMAL HIGH (ref 0.0–2.0)
Bicarbonate: 27.9 mmol/L (ref 20.0–28.0)
Calcium, Ion: 1.1 mmol/L — ABNORMAL LOW (ref 1.15–1.40)
HCT: 20 % — ABNORMAL LOW (ref 39.0–52.0)
Hemoglobin: 6.8 g/dL — CL (ref 13.0–17.0)
O2 Saturation: 100 %
Patient temperature: 98.2
Potassium: 3.5 mmol/L (ref 3.5–5.1)
Sodium: 140 mmol/L (ref 135–145)
TCO2: 29 mmol/L (ref 22–32)
pCO2 arterial: 30.9 mmHg — ABNORMAL LOW (ref 32.0–48.0)
pH, Arterial: 7.563 — ABNORMAL HIGH (ref 7.350–7.450)
pO2, Arterial: 438 mmHg — ABNORMAL HIGH (ref 83.0–108.0)

## 2020-10-16 LAB — ECHOCARDIOGRAM LIMITED
Area-P 1/2: 3.87 cm2
Height: 73 in
S' Lateral: 4.5 cm
Single Plane A4C EF: 32.3 %
Weight: 3463.87 oz

## 2020-10-16 LAB — LACTIC ACID, PLASMA
Lactic Acid, Venous: 1.8 mmol/L (ref 0.5–1.9)
Lactic Acid, Venous: 1.4 mmol/L (ref 0.5–1.9)

## 2020-10-16 LAB — CBC
HCT: 25.2 % — ABNORMAL LOW (ref 39.0–52.0)
HCT: 24.9 % — ABNORMAL LOW (ref 39.0–52.0)
Hemoglobin: 7.3 g/dL — ABNORMAL LOW (ref 13.0–17.0)
Hemoglobin: 7.1 g/dL — ABNORMAL LOW (ref 13.0–17.0)
MCH: 22 pg — ABNORMAL LOW (ref 26.0–34.0)
MCH: 21.9 pg — ABNORMAL LOW (ref 26.0–34.0)
MCHC: 29 g/dL — ABNORMAL LOW (ref 30.0–36.0)
MCHC: 28.5 g/dL — ABNORMAL LOW (ref 30.0–36.0)
MCV: 75.9 fL — ABNORMAL LOW (ref 80.0–100.0)
MCV: 76.9 fL — ABNORMAL LOW (ref 80.0–100.0)
Platelets: 220 10*3/uL (ref 150–400)
Platelets: 209 10*3/uL (ref 150–400)
RBC: 3.32 MIL/uL — ABNORMAL LOW (ref 4.22–5.81)
RBC: 3.24 MIL/uL — ABNORMAL LOW (ref 4.22–5.81)
RDW: 17 % — ABNORMAL HIGH (ref 11.5–15.5)
RDW: 16.9 % — ABNORMAL HIGH (ref 11.5–15.5)
WBC: 7.4 10*3/uL (ref 4.0–10.5)
WBC: 8.3 10*3/uL (ref 4.0–10.5)
nRBC: 0 % (ref 0.0–0.2)
nRBC: 0.7 % — ABNORMAL HIGH (ref 0.0–0.2)

## 2020-10-16 LAB — GLUCOSE, CAPILLARY
Glucose-Capillary: 148 mg/dL — ABNORMAL HIGH (ref 70–99)
Glucose-Capillary: 135 mg/dL — ABNORMAL HIGH (ref 70–99)
Glucose-Capillary: 119 mg/dL — ABNORMAL HIGH (ref 70–99)
Glucose-Capillary: 129 mg/dL — ABNORMAL HIGH (ref 70–99)

## 2020-10-16 LAB — TROPONIN I (HIGH SENSITIVITY)
Troponin I (High Sensitivity): 6892 ng/L (ref <18)
Troponin I (High Sensitivity): 6596 ng/L (ref <18)

## 2020-10-16 LAB — HEPARIN LEVEL (UNFRACTIONATED)
Heparin Unfractionated: 0.37 IU/mL (ref 0.30–0.70)
Heparin Unfractionated: <0.1 IU/mL — ABNORMAL LOW (ref 0.30–0.70)

## 2020-10-16 LAB — MAGNESIUM
Magnesium: 1.8 mg/dL (ref 1.7–2.4)
Magnesium: 1.6 mg/dL — ABNORMAL LOW (ref 1.7–2.4)

## 2020-10-16 LAB — PREPARE RBC (CROSSMATCH)

## 2020-10-16 MED ORDER — CHLORHEXIDINE GLUCONATE CLOTH 2 % EX PADS
6.0000 | MEDICATED_PAD | Freq: Every day | CUTANEOUS | Status: DC
  Administered 2020-10-16 – 2020-11-01 (×16): 6 via TOPICAL

## 2020-10-16 MED ORDER — POTASSIUM CHLORIDE 20 MEQ PO PACK
40.0000 meq | PACK | Freq: Once | ORAL | Status: DC
  Filled 2020-10-16: qty 2

## 2020-10-16 MED ORDER — FENTANYL BOLUS VIA INFUSION
50.0000 ug | INTRAVENOUS | Status: DC | PRN
  Filled 2020-10-16: qty 100

## 2020-10-16 MED ORDER — SODIUM CHLORIDE 0.9% IV SOLUTION: Freq: Once | INTRAVENOUS | Status: AC

## 2020-10-16 MED ORDER — POLYETHYLENE GLYCOL 3350 17 G PO PACK
17.0000 g | PACK | Freq: Every day | ORAL | Status: DC
  Administered 2020-10-16 – 2020-10-17 (×2): 17 g
  Filled 2020-10-16 (×2): qty 1

## 2020-10-16 MED ORDER — POTASSIUM CHLORIDE 20 MEQ PO PACK
40.0000 meq | PACK | Freq: Once | ORAL | Status: AC
  Administered 2020-10-16: 40 meq

## 2020-10-16 MED ORDER — MAGNESIUM SULFATE 2 GM/50ML IV SOLN
2.0000 g | Freq: Once | INTRAVENOUS | Status: AC
  Administered 2020-10-16: 2 g via INTRAVENOUS
  Filled 2020-10-16: qty 50

## 2020-10-16 MED ORDER — SODIUM CHLORIDE 0.9% FLUSH: 10.0000 mL | INTRAVENOUS | Status: DC | PRN

## 2020-10-16 MED ORDER — SUCCINYLCHOLINE CHLORIDE 200 MG/10ML IV SOSY
PREFILLED_SYRINGE | INTRAVENOUS | Status: AC
  Filled 2020-10-16: qty 10

## 2020-10-16 MED ORDER — INSULIN ASPART 100 UNIT/ML IJ SOLN
2.0000 [IU] | INTRAMUSCULAR | Status: DC
  Administered 2020-10-16 – 2020-10-18 (×8): 2 [IU] via SUBCUTANEOUS

## 2020-10-16 MED ORDER — FENTANYL CITRATE PF 50 MCG/ML IJ SOSY
PREFILLED_SYRINGE | INTRAMUSCULAR | Status: AC
  Administered 2020-10-16: 50 ug
  Filled 2020-10-16: qty 2

## 2020-10-16 MED ORDER — ROCURONIUM BROMIDE 10 MG/ML (PF) SYRINGE
PREFILLED_SYRINGE | INTRAVENOUS | Status: AC
  Filled 2020-10-16: qty 10

## 2020-10-16 MED ORDER — MIDAZOLAM HCL 2 MG/2ML IJ SOLN
INTRAMUSCULAR | Status: AC
  Administered 2020-10-16: 2 mg
  Filled 2020-10-16: qty 2

## 2020-10-16 MED ORDER — DEXMEDETOMIDINE HCL IN NACL 400 MCG/100ML IV SOLN
0.0000 ug/kg/h | INTRAVENOUS | Status: DC
  Administered 2020-10-16 (×2): 1 ug/kg/h via INTRAVENOUS
  Administered 2020-10-16: 0.4 ug/kg/h via INTRAVENOUS
  Administered 2020-10-16 – 2020-10-17 (×4): 1.2 ug/kg/h via INTRAVENOUS
  Filled 2020-10-16 (×7): qty 100

## 2020-10-16 MED ORDER — ALUM & MAG HYDROXIDE-SIMETH 200-200-20 MG/5ML PO SUSP
30.0000 mL | Freq: Four times a day (QID) | ORAL | Status: DC | PRN
  Administered 2020-10-30: 30 mL
  Filled 2020-10-16: qty 30

## 2020-10-16 MED ORDER — FENTANYL 2500MCG IN NS 250ML (10MCG/ML) PREMIX INFUSION
0.0000 ug/h | INTRAVENOUS | Status: DC
  Administered 2020-10-16: 50 ug/h via INTRAVENOUS
  Administered 2020-10-16: 200 ug/h via INTRAVENOUS
  Filled 2020-10-16 (×3): qty 250

## 2020-10-16 MED ORDER — CHLORHEXIDINE GLUCONATE 0.12% ORAL RINSE (MEDLINE KIT)
15.0000 mL | Freq: Two times a day (BID) | OROMUCOSAL | Status: DC
  Administered 2020-10-16 – 2020-11-01 (×23): 15 mL via OROMUCOSAL

## 2020-10-16 MED ORDER — NOREPINEPHRINE 4 MG/250ML-% IV SOLN: 0.0000 ug/min | INTRAVENOUS | Status: DC

## 2020-10-16 MED ORDER — FENTANYL CITRATE PF 50 MCG/ML IJ SOSY
50.0000 ug | PREFILLED_SYRINGE | Freq: Once | INTRAMUSCULAR | Status: DC
  Filled 2020-10-16: qty 1

## 2020-10-16 MED ORDER — ORAL CARE MOUTH RINSE
15.0000 mL | OROMUCOSAL | Status: DC
  Administered 2020-10-16 – 2020-10-21 (×30): 15 mL via OROMUCOSAL

## 2020-10-16 MED ORDER — ETOMIDATE 2 MG/ML IV SOLN
INTRAVENOUS | Status: AC
  Filled 2020-10-16: qty 20

## 2020-10-16 MED ORDER — DOCUSATE SODIUM 50 MG/5ML PO LIQD
100.0000 mg | Freq: Two times a day (BID) | ORAL | Status: DC
  Administered 2020-10-16 – 2020-10-17 (×4): 100 mg
  Filled 2020-10-16 (×4): qty 10

## 2020-10-16 MED ORDER — MAGNESIUM SULFATE 2 GM/50ML IV SOLN
2.0000 g | Freq: Once | INTRAVENOUS | Status: AC
  Administered 2020-10-16: 2 g via INTRAVENOUS

## 2020-10-16 MED ORDER — SODIUM CHLORIDE 0.9% FLUSH
10.0000 mL | Freq: Two times a day (BID) | INTRAVENOUS | Status: DC
  Administered 2020-10-16: 20 mL
  Administered 2020-10-16 – 2020-10-17 (×3): 10 mL
  Administered 2020-10-18: 20 mL
  Administered 2020-10-18 – 2020-10-20 (×5): 10 mL
  Administered 2020-10-21: 30 mL
  Administered 2020-10-21 – 2020-10-22 (×2): 10 mL
  Administered 2020-10-22: 30 mL
  Administered 2020-10-23: 10 mL
  Administered 2020-10-23: 30 mL
  Administered 2020-10-24 – 2020-10-31 (×13): 10 mL

## 2020-10-16 MED ORDER — ATORVASTATIN CALCIUM 80 MG PO TABS
80.0000 mg | ORAL_TABLET | Freq: Every evening | ORAL | Status: DC
  Administered 2020-10-16 – 2020-10-17 (×2): 80 mg
  Filled 2020-10-16 (×2): qty 1

## 2020-10-16 MED ORDER — ACETAMINOPHEN 325 MG PO TABS
650.0000 mg | ORAL_TABLET | ORAL | Status: DC | PRN
  Administered 2020-10-23: 650 mg
  Filled 2020-10-16: qty 2

## 2020-10-16 MED ORDER — ASPIRIN 81 MG PO CHEW
81.0000 mg | CHEWABLE_TABLET | Freq: Every day | ORAL | Status: DC
  Administered 2020-10-16 – 2020-10-17 (×2): 81 mg
  Filled 2020-10-16 (×2): qty 1

## 2020-10-16 NOTE — Progress Notes (Signed)
Date: 10/16/2020  Patient name: Darrell [name]  Medical record number: [id number]  Date of birth: 08/10/1955   I have seen and evaluated Darrell [name] and discussed their care with the Residency Team.  In brief, patient is 65 year old male with a past medical history of CAD status post CABG and recent admission in August for non-ST elevation MI, chronic diastolic heart failure, CKD stage IIIa, hypertension, diabetes and hyperlipidemia who presented to the ED with new onset chest pain x1 day.  Patient developed new onset chest pain on the night prior to his admission which was midsternal, nonradiating and was similar to his previous heart attack pain.  No nausea or vomiting, no lightheadedness, no syncope, no focal weakness, no tingling or numbness, no fevers or chills, no headache, no blurry vision, no abdominal pain.  Patient called EMS and took aspirin and was given nitroglycerin by EMS with resolution of his pain.  He was noted to have elevated troponins and T wave inversions in V5 to V6 consistent with a non-ST elevation MI.  However, patient refused cath on admission.  Today, when we initially evaluated him he was feeling well and states that his chest pain has resolved and had no new complaints.  His hemoglobin had decreased mildly to 7.3 from 8.4 on admission.  The plan at this time was to continue current medical management.  We were called urgently back to bedside approximately 10 minutes after evaluating him.  Patient developed polymorphic V. tach and became unresponsive and a CODE BLUE was called.  Patient was shocked out of VT but developed PEA and required ACLS for 10 to 15 minutes before obtaining ROSC.  Patient is transferred to ICU for further management.   PMHx, Fam Hx, and/or Soc Hx : As per resident admit note  Vitals:   10/16/20 1245 10/16/20 1300  BP: 121/80 120/80  Pulse:    Resp: 14 14  Temp:    SpO2: 99% 100%   Physical exam done prior to the patient  coding General: Was awake alert and oriented x3 with no apparent distress when we initially evaluated him prior to the code CVS: Regular rate and rhythm, normal heart sounds Lungs: CTA bilaterally Abdomen: Soft, nontender, nondistended, normoactive bowel sounds Extremities: No edema noted, nontender to palpation Psych: Normal mood and affect HEENT: Normocephalic, atraumatic Skin: Warm and dry  Assessment and Plan: I have seen and evaluated the patient as outlined above. I agree with the formulated Assessment and Plan as detailed in the residents' note, with the following changes:   1.  Status post cardiac arrest/non-ST elevation MI: -Patient presented to the ED initially with chest pain and was found to have elevated troponins up to the 900s and T wave inversions in his lateral leads consistent with a non-ST elevation MI.  Patient refused cardiac catheterization and was admitted to our service for medical management.  He was initially started on a heparin drip along with aspirin 81 mg, atorvastatin 80 mg and carvedilol and Imdur.  Patient was noted to have decreased hemoglobin today down to 7.3 from 8.4 and admission and was noted to be borderline hypotensive this morning.  Plan initially was to decrease his amlodipine to 5 mg as well as possibly decreasing his Imdur.  Patient was doing well today when we initially evaluated him.  However, patient's hospital course was complicated by cardiac arrest and he was transferred to the ICU for further management.  We will defer care to PCCM and resume care once  patient is transferred back to the floor  Darrell Lagos, MD 9/19/20221:10 PM

## 2020-10-16 NOTE — Progress Notes (Signed)
HD#1 SUBJECTIVE:  Patient Summary: Darrell [name] is a 65 y.o. person with medical history significant of CAD s/p CABG (2009) and PCI, recent admission in August for NSTEMI, HFrEF (45-50%), CKD stage III, Hypertension, diabetes, and Hyperlipidemia who presented with complaints of chest pain and was admitted for NSTEMI.    Overnight Events: Patient states that his shortness of breath is improved from yesterday and he has not had any more episodes of chest pain since Saturday evening.  Interim History: About 10 minutes following rounding on Darrell [name], he developed pulseless ventricular tachycardia.  He was defibrillated to PEA.  Several rounds of epinephrine and HCO3 given and he eventually recovered a perfusing rhythm.  Unable to reach wife, daughter, or son at that time.  He was transferred to ICU for further care.  OBJECTIVE:  Vital Signs: Vitals:   10/15/20 1958 10/15/20 2200 10/15/20 2314 10/16/20 0241  BP: (!) 155/96 130/73 128/78 135/76  Pulse: (!) 104  87 87  Resp: 19  (!) 21 (!) 25  Temp: 98.4 F (36.9 C)  98.5 F (36.9 C) 98.6 F (37 C)  TempSrc: Oral  Oral Oral  SpO2: 91%  90% 90%  Weight:      Height:       Supplemental O2:  Intubated SpO2: 90 % O2 Flow Rate (L/min): 2 L/min  Filed Weights   10/15/20 0456 10/15/20 1700  Weight: 106.6 kg 98.2 kg     Intake/Output Summary (Last 24 hours) at 10/16/2020 7829 Last data filed at 10/16/2020 5621 Gross per 24 hour  Intake 779.43 ml  Output 850 ml  Net -70.57 ml   Net IO Since Admission: -70.57 mL [10/16/20 0742]  Physical Exam: General: well-nourished, appears disheveled with long hair and long beard, resting comfortably in bed HENT: NCAT, moist mucous membranes Eyes: no scleral icterus, conjunctiva clear CV:No murmurs, rubs, gallops Pulm: CTAB, pulmonary effort normal GI: no tenderness, bowel sounds present, abdomen nondistended MSK: No peripheral edema in legs bilaterally, normal bulk and tone Skin:  multiple reddish papules along arms and back, warm and dry Psych: normal mood and affect, awake and oriented x3  Patient Lines/Drains/Airways Status     Active Line/Drains/Airways     Name Placement date Placement time Site Days   Peripheral IV 10/15/20 Anterior;Right Antecubital 10/15/20  0500  Antecubital  1            Pertinent Labs: CBC Latest Ref Rng & Units 10/16/2020 10/15/2020 10/15/2020  WBC 4.0 - 10.5 K/uL 7.4 - 8.6  Hemoglobin 13.0 - 17.0 g/dL 7.3(L) 8.8(L) 8.4(L)  Hematocrit 39.0 - 52.0 % 25.2(L) 26.0(L) 30.7(L)  Platelets 150 - 400 K/uL 220 - 292    CMP Latest Ref Rng & Units 10/16/2020 10/15/2020 10/15/2020  Glucose 70 - 99 mg/dL 308(M) - 578(I)  BUN 8 - 23 mg/dL 22 - 69(G)  Creatinine 0.61 - 1.24 mg/dL 2.95(M) - 8.41(L)  Sodium 135 - 145 mmol/L 134(L) 138 135  Potassium 3.5 - 5.1 mmol/L 3.6 3.9 3.8  Chloride 98 - 111 mmol/L 102 - 98  CO2 22 - 32 mmol/L 27 - 11(L)  Calcium 8.9 - 10.3 mg/dL 2.4(M) - 9.7  Total Protein 6.5 - 8.1 g/dL - - -  Total Bilirubin 0.3 - 1.2 mg/dL - - -  Alkaline Phos 38 - 126 U/L - - -  AST 15 - 41 U/L - - -  ALT 0 - 44 U/L - - -    No results for input(s): GLUCAP  in the last 72 hours.   Pertinent Imaging: No results found.  ASSESSMENT/PLAN:  Assessment: Active Problems:   NSTEMI (non-ST elevated myocardial infarction) (HCC)  Darrell [name] is a 65 y.o. person with medical history significant of CAD s/p CABG (2009) and PCI, recent admission in August for NSTEMI, HFrEF (45-50%), CKD stage III, Hypertension, diabetes, and Hyperlipidemia who presented with complaints of chest pain and was admitted for NSTEMI. He went into ventricular tachycardia then PEA with eventual ROSC 09/19 and was transferred to the ICU.  Plan: #Cardiac Arrest#NSTEMI On initial evaluation, patient stated that he felt better this morning and that his shortness of breath had improved from yesterday.  He denies further episodes of chest pain since Saturday  evening. About 10-15 minutes after seeing patient, he developed ventricular tachycardia.  Then was defibrillated to a sinus rhythm without a pulse.  He remained hypotensive and multiple rounds of meds/epi given until a pulse was regained.  2G of magnesium given for PMVT.  He was subsequently transferred to ICU for further care.  Cardiac arrest likely secondary to underlying vessel disease.  During this hospital admission as well as in the past, patient has refused cardiac catheretization.  He also had hemoglobin of 7.3 this morning, will transfuse 2 unit.  Family including wife, son, and daughter were called, but they did not answer at that time.     Best Practice: Diet: NPO VTE: on heparin Code: Full Family Contact: attempted to contact wife, son, and daughter DISPO: Anticipated discharge  unknown  pending Medical stability.  Signature: Rudene Christians, D.O. Internal Medicine Resident, PGY-1 [facility]  Pager: [phone] 7:42 AM, 10/16/2020   Please contact the on call pager after 5 pm and on weekends at [phone].

## 2020-10-16 NOTE — Progress Notes (Signed)
   Called urgently to the beside for CPR in progress. Monitor shows the patient was in VT - appears to be a polymorphic VT. He received precordial thump and defibrillation to a sinus rhythm without a pulse - hypotensive, multiple rounds of meds/epi, etc .. then regained a pulse. I have concern for possible bleeding - hemoglobin was 7 today and he was quite pale on rounds.  Very possible this was a primary cardiac event -given his ongoing angina. Will likely need cath - however, difficult situation as he has refused this multiple times. Attempted to call his wife, son and daughter without answer at 10:00 am to discuss possible cardiac catheterization. Will obtain STAT echo - labs are pending. Asked to give 2G magnesium for PMVT. He remains full code.  Chrystie Nose, MD, [facility], FACP  [place]  [facility]  Medical Director of the [facility] &  Cardiovascular Risk Reduction Clinic Diplomate of the American Board of Clinical Lipidology Attending Cardiologist  Direct Dial: [phone]  Fax: [phone]  Website:  [email or web address]

## 2020-10-16 NOTE — Progress Notes (Signed)
RT obtained post intubation ABG and results reported to Dr.Smith and RN @1232  7.56/30.9/438/27.9 and Hb 6.8 RT decreased RR from 18 to 14 as well as FiO2 from 100% to 30% per MD.  RT will continue to monitor.

## 2020-10-16 NOTE — Progress Notes (Signed)
  Echocardiogram 2D Echocardiogram has been performed.  Darrell [name] 10/16/2020, 11:19 AM

## 2020-10-16 NOTE — Progress Notes (Signed)
CH responded to CODE BLUE page in [facility]; pt. being attended by medical team when [facility] arrived; RN said no family were present; attending physicians attempted contact to family but were not able to get in touch at this time.  Pt. regained cardiac rhythm --> transferred to [facility].  Chaplains remain available as needed.  Elpidio Anis, Chaplain Pager: [phone]

## 2020-10-16 NOTE — Anesthesia Procedure Notes (Addendum)
Procedure Name: Intubation Date/Time: 10/16/2020 9:34 AM Performed by: Macie Burows, CRNA Pre-anesthesia Checklist: Patient being monitored, Patient identified and Suction available Oxygen Delivery Method: Ambu bag Preoxygenation: Pre-oxygenation with 100% oxygen Induction Type: Rapid sequence and Cricoid Pressure applied Laryngoscope Size: Glidescope and 4 Grade View: Grade I Tube type: Subglottic suction tube Tube size: 8.0 mm Number of attempts: 1 Airway Equipment and Method: Video-laryngoscopy and Rigid stylet Placement Confirmation: ETT inserted through vocal cords under direct vision, breath sounds checked- equal and bilateral and CO2 detector Secured at: 23 cm Tube secured with: Tape Dental Injury: Teeth and Oropharynx as per pre-operative assessment  Comments: Emergency intubation performed during code blue resuscitation.

## 2020-10-16 NOTE — Progress Notes (Signed)
ANTICOAGULATION CONSULT NOTE - Follow-Up Consult  Pharmacy Consult for heparin Indication: chest pain/ACS  No Known Allergies  Patient Measurements: Height: 6\' 1"  (185.4 cm) Weight: 98.2 kg (216 lb 7.9 oz) IBW/kg (Calculated) : 79.9 Heparin Dosing Weight: 98 kg   Vital Signs: Temp: 98.6 F (37 C) (09/19 0241) Temp Source: Oral (09/19 0241) BP: 135/76 (09/19 0241) Pulse Rate: 87 (09/19 0241)  Labs: Recent Labs    10/15/20 0502 10/15/20 0750 10/15/20 0927 10/15/20 0936 10/15/20 1810 10/16/20 0407  HGB 8.4*  --   --  8.8*  --  7.3*  HCT 30.7*  --   --  26.0*  --  25.2*  PLT 292  --   --   --   --  220  LABPROT  --   --  15.3*  --   --   --   INR  --   --  1.2  --   --   --   HEPARINUNFRC  --   --   --   --  0.24* 0.37  CREATININE 2.27*  --   --   --   --  1.47*  TROPONINIHS 685* 902*  --   --   --   --      Estimated Creatinine Clearance: 62.6 mL/min (A) (by C-G formula based on SCr of 1.47 mg/dL (H)).  Heparin Infusion: 1350 units/hr  Assessment: 74 YOM with recent NSTEMI but declined cardiac cath presents today with central chest pain. Troponins elevated. Pharmacy consulted to dose heparin for anticoagulation.  Heparin level therapeutic (0.37) on gtt at 1550 units/hr. No bleeding noted.  Goal of Therapy:  Heparin level 0.3-0.7 units/ml Monitor platelets by anticoagulation protocol: Yes   Plan:  Continue heparin 1550 units/hr F/u 6 hr confirmatory heparin level  77, PharmD, BCPS Please see amion for complete clinical pharmacist phone list 10/16/2020 5:18 AM

## 2020-10-16 NOTE — Progress Notes (Signed)
Patient ID: JUNE VACHA, male   DOB: October 29, 1955, 65 y.o.   MRN: [id number]  I was on the unit when code blue was called and ran the code.   Patient was in NSR earlier today, then was noted to have VT on telemetry.  He was pulseless.  Initially shocked out of VT but resulted in slow PEA.  ACLS was performed with a total of 10-15 minutes CPR.  Rhythm remained PEA through several rounds of epinephrine and HCO3.  Patient eventually recovered a perfusing rhythm, sinus tachy in 110s with stable BP not requiring additional pressors.  He was intubated and moved to ICU.   Of note, patient was admitted with NSTEMI but refused cath.  He is full code.  Also noted to have progressive anemia, hgb 7.3 this morning.  Plan for PRBC transfusion.  Will need to discuss next steps with patient's family.  Dr. Rennis Golden reassuming care.   CRITICAL CARE Performed by: Marca Ancona  Total critical care time: 35 minutes  Critical care time was exclusive of separately billable procedures and treating other patients.  Critical care was necessary to treat or prevent imminent or life-threatening deterioration.  Critical care was time spent personally by me on the following activities: development of treatment plan with patient and/or surrogate as well as nursing, discussions with consultants, evaluation of patient's response to treatment, examination of patient, obtaining history from patient or surrogate, ordering and performing treatments and interventions, ordering and review of laboratory studies, ordering and review of radiographic studies, pulse oximetry and re-evaluation of patient's condition.  Marca Ancona 10/16/2020 10:03 AM

## 2020-10-16 NOTE — Plan of Care (Signed)
  Problem: Education: Goal: Knowledge of General Education information will improve Description Including pain rating scale, medication(s)/side effects and non-pharmacologic comfort measures Outcome: Progressing   Problem: Health Behavior/Discharge Planning: Goal: Ability to manage health-related needs will improve Outcome: Progressing   

## 2020-10-16 NOTE — Progress Notes (Addendum)
Call from [place] at [facility], ""patient in a lethal rhythm and it is not artifact"  Writer ran to patient room, found patient unresponsive in a Vtach/Torsades rhythm. Vigorous sternal rub then precordial thump with no change in rhythm.  Code button triggered call for assistance. Call out of room for nurse to call April RN into room and began compressions, Code Cart brought to room by [employer], Bagging started by [employer], pads applied by [employer]. Team continued to do CPR until taken over by Respiratory, Rapid Response and Dr. Shirlee Latch and Dr. Rennis Golden when he arrived to the unit.

## 2020-10-16 NOTE — Consult Note (Signed)
NAME:  Darrell [name], MRN:  [id number], DOB:  May 27, 1955, LOS: 1 ADMISSION DATE:  10/15/2020, CONSULTATION DATE:  10/16/20 REFERRING MD:  Earl Lagos, MD, CHIEF COMPLAINT:  Cardiac arrest   History of Present Illness:  65 year old male with combined heart failure, hx CABG in 2009 and recent NSTEMI in 08/2020 who presented with acute hypoxemic respiratory failure and  NSTEMI this admission.  Troponin [id number]. Cardiology consulted and declined cardiac cath.  Pertinent  Medical History  Combined heart failure, hx CABG, DM2  Significant Hospital Events: Including procedures, antibiotic start and stop dates in addition to other pertinent events   9/18 Admitted to IMTS for chest pain. Cardiology consulted. Declined cath. Medical management with heparin gtt 9/19 Cardiac arrest with initial rhythm VT/?torsades. Intubated peri-code. Admitted to 2H. PCCM consulted.  Interim History / Subjective:    Objective   Blood pressure (!) 99/55, pulse 77, temperature 98.1 F (36.7 C), temperature source Oral, resp. rate 16, height 6\' 1"  (1.854 m), weight 98.2 kg, SpO2 94 %.        Intake/Output Summary (Last 24 hours) at 10/16/2020 0952 Last data filed at 10/16/2020 0753 Gross per 24 hour  Intake 1139.43 ml  Output 850 ml  Net 289.43 ml   Filed Weights   10/15/20 0456 10/15/20 1700  Weight: 106.6 kg 98.2 kg    Physical Exam: General: Critically ill-appearing, no acute distress HENT: [place], AT, ETT in place Eyes: EOMI, no scleral icterus Respiratory: Diminished breath sounds bilaterally.  No crackles, wheezing or rales Cardiovascular: RRR, -M/R/G, no JVD GI: BS+, soft, nontender Extremities:-Edema,-tenderness Neuro: Spontaneously moving extremities x 4  Resolved Hospital Problem list     Assessment & Plan:   VT arrest Chronic combined heart failure s/p CABG NSTEMI  --Cardiology following. Mag ordered. Contacting family regarding reconsideration of cardiac cath in setting of recent  cardiac arrest. --STAT echo --TTM not indicated with improved neuro status --Trend troponin 685>905> --Heparin gtt discontinued. Awaiting STAT H/H --Telemetry  Anemia, chronic Hg 8.8>7.3 on heparin gtt this am --Hold heparin gtt --F/u H/H befer resuming heparin  Acute hypoxemic respiratory failure secondary to arrest --Full vent support. Wean FIO2/PEEP per ARDS[employer] protocol --SBT/WUA daily --ABG --VAP  AoCKD stage III --Trend UOP/Cr --Avoid nephrotoxic agents  HTN --Hold anti-hypertensive agent  DM2  --CBG q4h   Best Practice (right click and "Reselect all SmartList Selections" daily)   Diet/type: NPO DVT prophylaxis: systemic heparin. Holding until Hg returns GI prophylaxis: PPI Lines: N/A Foley:  N/A Code Status:  full code Last date of multidisciplinary goals of care discussion [ ]  Will need to discuss with family today regarding cardiac cath and code status  Labs   CBC: Recent Labs  Lab 10/15/20 0502 10/15/20 0936 10/16/20 0407  WBC 8.6  --  7.4  HGB 8.4* 8.8* 7.3*  HCT 30.7* 26.0* 25.2*  MCV 80.6  --  75.9*  PLT 292  --  220    Basic Metabolic Panel: Recent Labs  Lab 10/15/20 0502 10/15/20 0936 10/16/20 0407  NA 135 138 134*  K 3.8 3.9 3.6  CL 98  --  102  CO2 11*  --  27  GLUCOSE 179*  --  152*  BUN 26*  --  22  CREATININE 2.27*  --  1.47*  CALCIUM 9.7  --  8.6*  MG 2.0  --  1.8   GFR: Estimated Creatinine Clearance: 62.6 mL/min (A) (by C-G formula based on SCr of 1.47 mg/dL (H)). Recent Labs  Lab 10/15/20 0502 10/15/20 0927 10/16/20 0407  WBC 8.6  --  7.4  LATICACIDVEN  --  1.4  --     Liver Function Tests: No results for input(s): AST, ALT, ALKPHOS, BILITOT, PROT, ALBUMIN in the last 168 hours. No results for input(s): LIPASE, AMYLASE in the last 168 hours. No results for input(s): AMMONIA in the last 168 hours.  ABG    Component Value Date/Time   PHART 7.345 (L) 12/09/2007 1949   PCO2ART 34.8 (L) 12/09/2007 1949    PO2ART 141.0 (H) 12/09/2007 1949   HCO3 19.7 (L) 10/15/2020 1810   TCO2 18 (L) 10/15/2020 0936   ACIDBASEDEF 5.1 (H) 10/15/2020 1810   O2SAT 44.8 10/15/2020 1810     Coagulation Profile: Recent Labs  Lab 10/15/20 0927  INR 1.2    Cardiac Enzymes: No results for input(s): CKTOTAL, CKMB, CKMBINDEX, TROPONINI in the last 168 hours.  HbA1C: Hgb A1c MFr Bld  Date/Time Value Ref Range Status  09/03/2020 03:25 PM 5.6 4.8 - 5.6 % Final    Comment:    (NOTE) Pre diabetes:          5.7%-6.4%  Diabetes:              >6.4%  Glycemic control for   <7.0% adults with diabetes   05/29/2020 07:34 PM 5.4 4.8 - 5.6 % Final    Comment:    (NOTE) Pre diabetes:          5.7%-6.4%  Diabetes:              >6.4%  Glycemic control for   <7.0% adults with diabetes     CBG: No results for input(s): GLUCAP in the last 168 hours.  Review of Systems:   Unable to obtain due to critical illness  Past Medical History:  He,  has a past medical history of CKD (chronic kidney disease), stage III (HCC), Claudication (HCC), Coronary artery disease, Diabetes mellitus type 2 in nonobese (HCC), Diastolic dysfunction, Hyperlipidemia, Hypertension, Noncompliance, and Right carotid bruit.   Surgical History:   Past Surgical History:  Procedure Laterality Date   CARDIAC CATHETERIZATION N/A 04/06/2015   Procedure: Left Heart Cath and Cors/Grafts Angiography;  Surgeon: Runell Gess, MD;  [facility] INVASIVE CV LAB;  Service: Cardiovascular;  Laterality: N/A;   CARDIAC CATHETERIZATION N/A 04/06/2015   Procedure: Coronary Stent Intervention;  Surgeon: Runell Gess, MD;  2.5 mm x 12 mm long Synergy DES followed by  2.5 mm x 16 mm long Synergy DES     CORONARY ARTERY BYPASS GRAFT  2009   2009 LIMA to LAD, SVG to Diag, SVG to PDA and PL, left radial to OM   CORONARY BALLOON ANGIOPLASTY N/A 05/04/2018   Procedure: CORONARY BALLOON ANGIOPLASTY;  Surgeon: Runell Gess, MD;  Location: [facility] INVASIVE  CV LAB;  Service: Cardiovascular;  Laterality: N/A;  Prox CFX   LEFT HEART CATH N/A 10/24/2012   Procedure: LEFT HEART CATH;  Surgeon: Lennette Bihari, MD;  Location: [facility] CATH LAB;  Service: Cardiovascular;  Laterality: N/A;   LEFT HEART CATH AND CORS/GRAFTS ANGIOGRAPHY N/A 05/04/2018   Procedure: LEFT HEART CATH AND CORS/GRAFTS ANGIOGRAPHY;  Surgeon: Runell Gess, MD;  Location: [facility] INVASIVE CV LAB;  Service: Cardiovascular;  Laterality: N/A;     Social History:   reports that he has never smoked. He has never used smokeless tobacco. He reports that he does not drink alcohol and does not use drugs.   Family History:  His family history includes CAD (age of onset: 24) in his mother; Cancer in his mother; Heart failure in his father and mother; Kidney failure in his brother.   Allergies No Known Allergies   Home Medications  Prior to Admission medications   Medication Sig Start Date End Date Taking? Authorizing Provider  ferrous sulfate 325 (65 FE) MG tablet Take 1 tablet (325 mg total) by mouth 2 (two) times daily with a meal. 09/06/20 11/05/20 Yes Elgergawy, Leana Roe, MD  hydrALAZINE (APRESOLINE) 50 MG tablet Take 1 tablet (50 mg total) by mouth 3 (three) times daily. 09/06/20  Yes Elgergawy, Leana Roe, MD  amLODipine (NORVASC) 10 MG tablet Take 1 tablet (10 mg total) by mouth daily. Please make PCP appointment. 09/06/20   Elgergawy, Leana Roe, MD  aspirin 81 MG EC tablet Take 1 tablet (81 mg total) by mouth daily. Swallow whole. 09/07/20   Elgergawy, Leana Roe, MD  atorvastatin (LIPITOR) 80 MG tablet Take 1 tablet (80 mg total) by mouth every evening. 09/06/20   Elgergawy, Leana Roe, MD  carvedilol (COREG) 25 MG tablet Take 1 tablet (25 mg total) by mouth 2 (two) times daily with a meal. Please make PCP appointment. 09/06/20   Elgergawy, Leana Roe, MD  clopidogrel (PLAVIX) 75 MG tablet Take 1 tablet (75 mg total) by mouth daily. Please make PCP appointment. 09/06/20   Elgergawy, Leana Roe, MD   isosorbide mononitrate (IMDUR) 60 MG 24 hr tablet Take 2 tablets (120 mg total) by mouth daily. Please make PCP appointment. 09/06/20   Elgergawy, Leana Roe, MD  metFORMIN (GLUCOPHAGE) 500 MG tablet Take 1 tablet (500 mg total) by mouth daily with breakfast. 09/06/20 11/05/20  Elgergawy, Leana Roe, MD  nitroGLYCERIN (NITROSTAT) 0.4 MG SL tablet Place 1 tablet (0.4 mg total) under the tongue every 5 (five) minutes as needed for chest pain. 09/06/20 10/06/20  Elgergawy, Leana Roe, MD  tamsulosin (FLOMAX) 0.4 MG CAPS capsule Take 1 capsule (0.4 mg total) by mouth daily. Please make PCP appointment. 09/06/20   Elgergawy, Leana Roe, MD     Critical care time: 60 min     The patient is critically ill with VT arrest on mechanical ventilation and requires high complexity decision making for assessment and support, frequent evaluation and titration of therapies, application of advanced monitoring technologies and extensive interpretation of multiple databases.   Mechele Collin, M.D. [facility] Pulmonary/Critical Care Medicine 10/16/2020 9:52 AM   Please see Amion for pager number to reach on-call Pulmonary and Critical Care Team.

## 2020-10-16 NOTE — Progress Notes (Signed)
DAILY PROGRESS NOTE   Patient Name: ROYAL VANDEVOORT Date of Encounter: 10/16/2020 Cardiologist: [name], MD  Chief Complaint   No chest pain  Patient Profile   Darrell [name] is a 65 y.o. male with a hx of CAD status post CABG and PCI's, hypertension, hyperlipidemia, diabetes, CKD stage III, chronic diastolic heart failure, bedbug infestation, medical noncompliance who is being seen 10/15/2020 for the evaluation of chest pain at the request of Dr Stevie Kern.  Subjective   Feeling better today - no chest pain. Creatinine improved to 1.47. Anemic with hemoglobin 7.3 today, down from 8.8 (may be due to hydration). BP has come down with meds - now borderline hypotensive.   Objective   Vitals:   10/15/20 2200 10/15/20 2314 10/16/20 0241 10/16/20 0752  BP: 130/73 128/78 135/76 (!) 99/55  Pulse:  87 87 77  Resp:  (!) 21 (!) 25 16  Temp:  98.5 F (36.9 C) 98.6 F (37 C) 98.1 F (36.7 C)  TempSrc:  Oral Oral Oral  SpO2:  90% 90% 94%  Weight:      Height:        Intake/Output Summary (Last 24 hours) at 10/16/2020 0852 Last data filed at 10/16/2020 0753 Gross per 24 hour  Intake 1139.43 ml  Output 850 ml  Net 289.43 ml   Filed Weights   10/15/20 0456 10/15/20 1700  Weight: 106.6 kg 98.2 kg    Physical Exam   General appearance: alert and desheveled, no distress Neck: no carotid bruit, no JVD, and thyroid not enlarged, symmetric, no tenderness/mass/nodules Lungs: clear to auscultation bilaterally Heart: regular rate and rhythm Abdomen: soft, non-tender; bowel sounds normal; no masses,  no organomegaly Extremities: edema 1+  Pulses: 2+ and symmetric Skin: Skin color, texture, turgor normal. No rashes or lesions Neurologic: Grossly normal Psych: Flat affect  Inpatient Medications    Scheduled Meds:  amLODipine  10 mg Oral Daily   aspirin EC  81 mg Oral Daily   atorvastatin  80 mg Oral QPM   carvedilol  25 mg Oral BID WC   isosorbide mononitrate  120 mg  Oral Daily   potassium chloride  40 mEq Oral Once    Continuous Infusions:  heparin 1,550 Units/hr (10/16/20 0612)    PRN Meds: acetaminophen, albuterol, alum & mag hydroxide-simeth, nitroGLYCERIN, ondansetron (ZOFRAN) IV   Labs   Results for orders placed or performed during the hospital encounter of 10/15/20 (from the past 48 hour(s))  Basic metabolic panel     Status: Abnormal   Collection Time: 10/15/20  5:02 AM  Result Value Ref Range   Sodium 135 135 - 145 mmol/L   Potassium 3.8 3.5 - 5.1 mmol/L   Chloride 98 98 - 111 mmol/L   CO2 11 (L) 22 - 32 mmol/L   Glucose, Bld 179 (H) 70 - 99 mg/dL   BUN 26 (H) 8 - 23 mg/dL   Creatinine, Ser 7.16 (H) 0.61 - 1.24 mg/dL   Calcium 9.7 8.9 - 96.7 mg/dL   GFR, Estimated 31 (L) >60 mL/min    Comment: (NOTE) Calculated using the CKD-EPI Creatinine Equation (2021)    Anion gap 26 (H) 5 - 15    Comment: REPEATED TO VERIFY Performed at [facility], [address] [place], [place] [address]   CBC     Status: Abnormal   Collection Time: 10/15/20  5:02 AM  Result Value Ref Range   WBC 8.6 4.0 - 10.5 K/uL   RBC  3.81 (L) 4.22 - 5.81 MIL/uL   Hemoglobin 8.4 (L) 13.0 - 17.0 g/dL   HCT 16.1 (L) 09.6 - 04.5 %   MCV 80.6 80.0 - 100.0 fL   MCH 22.0 (L) 26.0 - 34.0 pg   MCHC 27.4 (L) 30.0 - 36.0 g/dL   RDW 40.9 (H) 81.1 - 91.4 %   Platelets 292 150 - 400 K/uL   nRBC 0.0 0.0 - 0.2 %    Comment: Performed at [facility], [address], [place], [place] [address]  Troponin I (High Sensitivity)     Status: Abnormal   Collection Time: 10/15/20  5:02 AM  Result Value Ref Range   Troponin I (High Sensitivity) 685 (HH) <18 ng/L    Comment: CRITICAL VALUE NOTED.  VALUE IS CONSISTENT WITH PREVIOUSLY REPORTED AND CALLED VALUE. (NOTE) Elevated high sensitivity troponin I (hsTnI) values and significant  changes across serial measurements may suggest ACS but many other  chronic and acute conditions are known to elevate hsTnI results.   Refer to the Links section for chest pain algorithms and additional  guidance. Performed at [facility], [address] [place], [place] [address]   Magnesium     Status: None   Collection Time: 10/15/20  5:02 AM  Result Value Ref Range   Magnesium 2.0 1.7 - 2.4 mg/dL    Comment: Performed at [facility], [address] [place], [place] [address]  Resp Panel by RT-PCR (Flu A&B, Covid) Nasopharyngeal Swab     Status: None   Collection Time: 10/15/20  5:12 AM   Specimen: Nasopharyngeal Swab; Nasopharyngeal(NP) swabs in vial transport medium  Result Value Ref Range   SARS Coronavirus 2 by RT PCR NEGATIVE NEGATIVE    Comment: (NOTE) SARS-CoV-2 target nucleic acids are NOT DETECTED.  The SARS-CoV-2 RNA is generally detectable in upper respiratory specimens during the acute phase of infection. The lowest concentration of SARS-CoV-2 viral copies this assay can detect is 138 copies/mL. A negative result does not preclude SARS-Cov-2 infection and should not be used as the sole basis for treatment or other patient management decisions. A negative result may occur with  improper specimen collection/handling, submission of specimen other than nasopharyngeal swab, presence of viral mutation(s) within the areas targeted by this assay, and inadequate number of viral copies(<138 copies/mL). A negative result must be combined with clinical observations, patient history, and epidemiological information. The expected result is Negative.  Fact Sheet for Patients:  BloggerCourse.com  Fact Sheet for Healthcare Providers:  SeriousBroker.it  This test is no t yet approved or cleared by the [place] FDA and  has been authorized for detection and/or diagnosis of SARS-CoV-2 by FDA under an Emergency Use Authorization (EUA). This EUA will remain  in effect (meaning this test can be used) for the duration of the COVID-19  declaration under Section 564(b)(1) of the Act, 21 U.S.C.section 360bbb-3(b)(1), unless the authorization is terminated  or revoked sooner.       Influenza A by PCR NEGATIVE NEGATIVE   Influenza B by PCR NEGATIVE NEGATIVE    Comment: (NOTE) The Xpert Xpress SARS-CoV-2/FLU/RSV plus assay is intended as an aid in the diagnosis of influenza from Nasopharyngeal swab specimens and should not be used as a sole basis for treatment. Nasal washings and aspirates are unacceptable for Xpert Xpress SARS-CoV-2/FLU/RSV testing.  Fact Sheet for Patients: BloggerCourse.com  Fact Sheet for Healthcare Providers: SeriousBroker.it  This test is not yet approved or cleared by the  [place] [employer] trader and has been authorized for detection and/or diagnosis of SARS-CoV-2 by FDA under an TEFL teacher (EUA). This EUA will remain in effect (meaning this test can be used) for the duration of the COVID-19 declaration under Section 564(b)(1) of the Act, 21 U.S.C. section 360bbb-3(b)(1), unless the authorization is terminated or revoked.  Performed at [facility], [address], [place], [place] [address]   Troponin I (High Sensitivity)     Status: Abnormal   Collection Time: 10/15/20  7:50 AM  Result Value Ref Range   Troponin I (High Sensitivity) 902 (HH) <18 ng/L    Comment: CRITICAL VALUE NOTED.  VALUE IS CONSISTENT WITH PREVIOUSLY REPORTED AND CALLED VALUE. (NOTE) Elevated high sensitivity troponin I (hsTnI) values and significant  changes across serial measurements may suggest ACS but many other  chronic and acute conditions are known to elevate hsTnI results.  Refer to the Links section for chest pain algorithms and additional  guidance. Performed at [facility], [address], [place], [place] [address]   Ethanol     Status: None   Collection Time: 10/15/20  9:27 AM  Result Value Ref Range   Alcohol,  Ethyl (B) <10 <10 mg/dL    Comment: (NOTE) Lowest detectable limit for serum alcohol is 10 mg/dL.  For medical purposes only. Performed at [facility], [address], [place], [place] [address]   Lactic acid, plasma     Status: None   Collection Time: 10/15/20  9:27 AM  Result Value Ref Range   Lactic Acid, Venous 1.4 0.5 - 1.9 mmol/L    Comment: Performed at [facility], [address], [place], [place] [address]  Beta-hydroxybutyric acid     Status: Abnormal   Collection Time: 10/15/20  9:27 AM  Result Value Ref Range   Beta-Hydroxybutyric Acid 5.31 (H) 0.05 - 0.27 mmol/L    Comment: RESULTS CONFIRMED BY MANUAL DILUTION Performed at [facility], [address] [place], [place] [address]   Protime-INR     Status: Abnormal   Collection Time: 10/15/20  9:27 AM  Result Value Ref Range   Prothrombin Time 15.3 (H) 11.4 - 15.2 seconds   INR 1.2 0.8 - 1.2    Comment: (NOTE) INR goal varies based on device and disease states. Performed at [facility], [address], [place], [place] [address]   Blood culture (routine x 2)     Status: None (Preliminary result)   Collection Time: 10/15/20  9:28 AM   Specimen: BLOOD  Result Value Ref Range   Specimen Description BLOOD SITE NOT SPECIFIED    Special Requests      BOTTLES DRAWN AEROBIC AND ANAEROBIC Blood Culture adequate volume   Culture      NO GROWTH < 24 HOURS Performed at [facility], [address] [place], [place] [address]    Report Status PENDING   Blood culture (routine x 2)     Status: None (Preliminary result)   Collection Time: 10/15/20  9:29 AM   Specimen: BLOOD  Result Value Ref Range   Specimen Description BLOOD SITE NOT SPECIFIED    Special Requests      BOTTLES DRAWN AEROBIC AND ANAEROBIC Blood Culture adequate volume   Culture      NO GROWTH < 24 HOURS Performed at [facility], [address] [place], [place] [address]    Report Status PENDING  I-Stat venous  blood gas, ED     Status: Abnormal   Collection Time: 10/15/20  9:36 AM  Result Value Ref Range   pH, Ven 7.371 7.250 - 7.430   pCO2, Ven 29.8 (L) 44.0 - 60.0 mmHg   pO2, Ven 44.0 32.0 - 45.0 mmHg   Bicarbonate 17.3 (L) 20.0 - 28.0 mmol/L   TCO2 18 (L) 22 - 32 mmol/L   O2 Saturation 79.0 %   Acid-base deficit 7.0 (H) 0.0 - 2.0 mmol/L   Sodium 138 135 - 145 mmol/L   Potassium 3.9 3.5 - 5.1 mmol/L   Calcium, Ion 1.18 1.15 - 1.40 mmol/L   HCT 26.0 (L) 39.0 - 52.0 %   Hemoglobin 8.8 (L) 13.0 - 17.0 g/dL   Sample type VENOUS   Salicylate level     Status: Abnormal   Collection Time: 10/15/20 11:54 AM  Result Value Ref Range   Salicylate Lvl <7.0 (L) 7.0 - 30.0 mg/dL    Comment: Performed at [facility], [address] [place], [place] [address]  Acetaminophen level     Status: Abnormal   Collection Time: 10/15/20 11:54 AM  Result Value Ref Range   Acetaminophen (Tylenol), Serum <10 (L) 10 - 30 ug/mL    Comment: (NOTE) Therapeutic concentrations vary significantly. A range of 10-30 ug/mL  may be an effective concentration for many patients. However, some  are best treated at concentrations outside of this range. Acetaminophen concentrations >150 ug/mL at 4 hours after ingestion  and >50 ug/mL at 12 hours after ingestion are often associated with  toxic reactions.  Performed at [facility], [address] [place], [place] [address]   Blood gas, venous (at [facility], not at [facility])     Status: Abnormal   Collection Time: 10/15/20  6:10 PM  Result Value Ref Range   FIO2 28.00    pH, Ven 7.336 7.250 - 7.430   pCO2, Ven 38.0 (L) 44.0 - 60.0 mmHg   pO2, Ven <32.0 (LL) 32.0 - 45.0 mmHg    Comment: CRITICAL RESULT CALLED TO, READ BACK BY AND VERIFIED WITH: S. KING RN [id number] 10/15/20 LIN AUNG    Bicarbonate 19.7 (L) 20.0 - 28.0 mmol/L   Acid-base deficit 5.1 (H) 0.0 - 2.0 mmol/L   O2 Saturation 44.8 %   Patient temperature 37.0    Drawn by 164    Sample type VENOUS      Comment: Performed at [facility], [address], [place], [place] [address]  Heparin level (unfractionated)     Status: Abnormal   Collection Time: 10/15/20  6:10 PM  Result Value Ref Range   Heparin Unfractionated 0.24 (L) 0.30 - 0.70 IU/mL    Comment: (NOTE) The clinical reportable range upper limit is being lowered to >1.10 to align with the FDA approved guidance for the current laboratory assay.  If heparin results are below expected values, and patient dosage has  been confirmed, suggest follow up testing of antithrombin III levels. Performed at [facility], [address], [place], [place] [address]   D-dimer, quantitative     Status: Abnormal   Collection Time: 10/15/20  6:10 PM  Result Value Ref Range   D-Dimer, Quant 1.07 (H) 0.00 - 0.50 ug/mL-FEU    Comment: (NOTE) At the manufacturer cut-off value of 0.5 g/mL FEU, this assay has a negative predictive value of 95-100%.This assay is intended for use in conjunction with a clinical pretest  probability (PTP) assessment model to exclude pulmonary embolism (PE) and deep venous thrombosis (DVT) in outpatients suspected of PE or DVT. Results should be correlated with clinical presentation. Performed at [facility], [address], [place], [place] [address]   MRSA Next Gen by PCR, Nasal     Status: None   Collection Time: 10/15/20  6:30 PM   Specimen: Nasal Mucosa; Nasal Swab  Result Value Ref Range   MRSA by PCR Next Gen NOT DETECTED NOT DETECTED    Comment: (NOTE) The GeneXpert MRSA Assay (FDA approved for NASAL specimens only), is one component of a comprehensive MRSA colonization surveillance program. It is not intended to diagnose MRSA infection nor to guide or monitor treatment for MRSA infections. Test performance is not FDA approved in patients less than 64 years old. Performed at [facility], [address] [place], [place] [address]   CBC     Status: Abnormal   Collection Time:  10/16/20  4:07 AM  Result Value Ref Range   WBC 7.4 4.0 - 10.5 K/uL   RBC 3.32 (L) 4.22 - 5.81 MIL/uL   Hemoglobin 7.3 (L) 13.0 - 17.0 g/dL    Comment: Reticulocyte Hemoglobin testing may be clinically indicated, consider ordering this additional test [id number]    HCT 25.2 (L) 39.0 - 52.0 %   MCV 75.9 (L) 80.0 - 100.0 fL   MCH 22.0 (L) 26.0 - 34.0 pg   MCHC 29.0 (L) 30.0 - 36.0 g/dL   RDW 60.6 (H) 30.1 - 60.1 %   Platelets 220 150 - 400 K/uL   nRBC 0.0 0.0 - 0.2 %    Comment: Performed at [facility], [address], [place], [place] [address]  Basic metabolic panel     Status: Abnormal   Collection Time: 10/16/20  4:07 AM  Result Value Ref Range   Sodium 134 (L) 135 - 145 mmol/L   Potassium 3.6 3.5 - 5.1 mmol/L   Chloride 102 98 - 111 mmol/L   CO2 27 22 - 32 mmol/L   Glucose, Bld 152 (H) 70 - 99 mg/dL    Comment: Glucose reference range applies only to samples taken after fasting for at least 8 hours.   BUN 22 8 - 23 mg/dL   Creatinine, Ser 5.57 (H) 0.61 - 1.24 mg/dL   Calcium 8.6 (L) 8.9 - 10.3 mg/dL   GFR, Estimated 53 (L) >60 mL/min    Comment: (NOTE) Calculated using the CKD-EPI Creatinine Equation (2021)    Anion gap 5 5 - 15    Comment: Performed at [facility], [address], [place], [place] [address]  Heparin level (unfractionated)     Status: None   Collection Time: 10/16/20  4:07 AM  Result Value Ref Range   Heparin Unfractionated 0.37 0.30 - 0.70 IU/mL    Comment: (NOTE) The clinical reportable range upper limit is being lowered to >1.10 to align with the FDA approved guidance for the current laboratory assay.  If heparin results are below expected values, and patient dosage has  been confirmed, suggest follow up testing of antithrombin III levels. Performed at [facility], [address] [place], [place] [address]   Magnesium     Status: None   Collection Time: 10/16/20  4:07 AM  Result Value Ref Range   Magnesium 1.8 1.7 - 2.4  mg/dL    Comment: Performed at [facility], [address], [place], [place]  [address]    ECG   N/A  Telemetry   Sinus rhythm - Personally Reviewed  Radiology    DG Chest Port 1 View  Result Date: 10/15/2020 CLINICAL DATA:  Chest pain EXAM: PORTABLE CHEST 1 VIEW COMPARISON:  09/03/2020 FINDINGS: 0555 hours. The cardio pericardial silhouette is enlarged. The lungs are clear without focal pneumonia, edema, pneumothorax or pleural effusion. The visualized bony structures of the thorax show no acute abnormality. Telemetry leads overlie the chest. IMPRESSION: No active disease. Electronically Signed   By: Kennith Center M.D.   On: 10/15/2020 06:04    Cardiac Studies   none  Assessment   Principal Problem:   NSTEMI (non-ST elevated myocardial infarction) [facility]) Active Problems:   Type 2 diabetes mellitus without complication, without long-term current use of insulin (HCC)   Adjustment disorder with depressed mood   Coronary atherosclerosis, s/p CABG in 2009    History of noncompliance with medical treatment, presenting hazards to health   Diabetes mellitus type 2 in nonobese [facility])   Plan   Feels better today - although flat affect. BP is borderline low - could probably cut back amlodipine to 5 mg daily and possibly imdur. Will continue heparin for 48 hrs. No plans for intervention. Need to continue to work with social work - ?Set designer consult. D/w medicine service rounding team.  Time Spent Directly with Patient:  I have spent a total of 25 minutes with the patient reviewing hospital notes, telemetry, EKGs, labs and examining the patient as well as establishing an assessment and plan that was discussed personally with the patient.  > 50% of time was spent in direct patient care.  Length of Stay:  LOS: 1 day   Chrystie Nose, MD, [facility], FACP  [place]  [facility]  Medical Director of the Advanced Lipid Disorders &  Cardiovascular Risk Reduction  Clinic Diplomate of the American Board of Clinical Lipidology Attending Cardiologist  Direct Dial: [phone]  Fax: [phone]  Website:  [email or web address] Nicely Robbye Dede 10/16/2020, 8:52 AM

## 2020-10-16 NOTE — Procedures (Signed)
Central Venous Catheter Insertion Procedure Note  Darrell [name]  [id number]  25-Jan-1956  Date:10/16/20  Time:10:39 AM   Provider Performing:Nicolaus Andel Salena Saner Katrinka Blazing   Procedure: Insertion of Non-tunneled Central Venous Catheter(36556) without US guidance  Indication(s) Medication administration  Consent Unable to obtain consent due to emergent nature of procedure.  Anesthesia Topical only with 1% lidocaine   Timeout Verified patient identification, verified procedure, site/side was marked, verified correct patient position, special equipment/implants available, medications/allergies/relevant history reviewed, required imaging and test results available.  Sterile Technique Maximal sterile technique including full sterile barrier drape, hand hygiene, sterile gown, sterile gloves, mask, hair covering, sterile ultrasound probe cover (if used).  Procedure Description Area of catheter insertion was cleaned with chlorhexidine and draped in sterile fashion.  Without real-time ultrasound guidance a central venous catheter was placed into the left subclavian vein. Nonpulsatile blood flow and easy flushing noted in all ports.  The catheter was sutured in place and sterile dressing applied.  Complications/Tolerance None; patient tolerated the procedure well. Chest X-ray is ordered to verify placement for internal jugular or subclavian cannulation.   Chest x-ray is not ordered for femoral cannulation.  EBL Minimal  Specimen(s) None

## 2020-10-17 DIAGNOSIS — I214 Non-ST elevation (NSTEMI) myocardial infarction: Secondary | ICD-10-CM | POA: Diagnosis not present

## 2020-10-17 DIAGNOSIS — I469 Cardiac arrest, cause unspecified: Secondary | ICD-10-CM

## 2020-10-17 DIAGNOSIS — I472 Ventricular tachycardia: Secondary | ICD-10-CM

## 2020-10-17 DIAGNOSIS — F4321 Adjustment disorder with depressed mood: Secondary | ICD-10-CM | POA: Diagnosis not present

## 2020-10-17 DIAGNOSIS — I25110 Atherosclerotic heart disease of native coronary artery with unstable angina pectoris: Secondary | ICD-10-CM

## 2020-10-17 DIAGNOSIS — Z9119 Patient's noncompliance with other medical treatment and regimen: Secondary | ICD-10-CM

## 2020-10-17 LAB — TYPE AND SCREEN
ABO/RH(D): O NEG
Antibody Screen: NEGATIVE
Unit division: 0
Unit division: 0
Unit division: 0
Unit division: 0

## 2020-10-17 LAB — BASIC METABOLIC PANEL
Anion gap: 9 (ref 5–15)
Anion gap: 9 (ref 5–15)
BUN: 26 mg/dL — ABNORMAL HIGH (ref 8–23)
BUN: 33 mg/dL — ABNORMAL HIGH (ref 8–23)
CO2: 23 mmol/L (ref 22–32)
CO2: 23 mmol/L (ref 22–32)
Calcium: 8.2 mg/dL — ABNORMAL LOW (ref 8.9–10.3)
Calcium: 8.3 mg/dL — ABNORMAL LOW (ref 8.9–10.3)
Chloride: 104 mmol/L (ref 98–111)
Chloride: 106 mmol/L (ref 98–111)
Creatinine, Ser: 2.64 mg/dL — ABNORMAL HIGH (ref 0.61–1.24)
Creatinine, Ser: 3.33 mg/dL — ABNORMAL HIGH (ref 0.61–1.24)
GFR, Estimated: 26 mL/min — ABNORMAL LOW (ref >60)
GFR, Estimated: 20 mL/min — ABNORMAL LOW (ref >60)
Glucose, Bld: 127 mg/dL — ABNORMAL HIGH (ref 70–99)
Glucose, Bld: 99 mg/dL (ref 70–99)
Potassium: 3.4 mmol/L — ABNORMAL LOW (ref 3.5–5.1)
Potassium: 4.1 mmol/L (ref 3.5–5.1)
Sodium: 136 mmol/L (ref 135–145)
Sodium: 138 mmol/L (ref 135–145)

## 2020-10-17 LAB — BPAM RBC
Blood Product Expiration Date: 202209252359
Blood Product Expiration Date: 202209262359
Blood Product Expiration Date: 202210202359
Blood Product Expiration Date: 202210202359
ISSUE DATE / TIME: 202209191300
ISSUE DATE / TIME: 202209191300
ISSUE DATE / TIME: 202209191552
ISSUE DATE / TIME: 202209191904
Unit Type and Rh: 5100
Unit Type and Rh: 5100
Unit Type and Rh: 9500
Unit Type and Rh: 9500

## 2020-10-17 LAB — CBC
HCT: 30.3 % — ABNORMAL LOW (ref 39.0–52.0)
HCT: 34.3 % — ABNORMAL LOW (ref 39.0–52.0)
Hemoglobin: 9.2 g/dL — ABNORMAL LOW (ref 13.0–17.0)
Hemoglobin: 10.1 g/dL — ABNORMAL LOW (ref 13.0–17.0)
MCH: 23.4 pg — ABNORMAL LOW (ref 26.0–34.0)
MCH: 23.3 pg — ABNORMAL LOW (ref 26.0–34.0)
MCHC: 30.4 g/dL (ref 30.0–36.0)
MCHC: 29.4 g/dL — ABNORMAL LOW (ref 30.0–36.0)
MCV: 76.9 fL — ABNORMAL LOW (ref 80.0–100.0)
MCV: 79 fL — ABNORMAL LOW (ref 80.0–100.0)
Platelets: 179 10*3/uL (ref 150–400)
Platelets: 223 10*3/uL (ref 150–400)
RBC: 3.94 MIL/uL — ABNORMAL LOW (ref 4.22–5.81)
RBC: 4.34 MIL/uL (ref 4.22–5.81)
RDW: 17.2 % — ABNORMAL HIGH (ref 11.5–15.5)
RDW: 18.1 % — ABNORMAL HIGH (ref 11.5–15.5)
WBC: 6 10*3/uL (ref 4.0–10.5)
WBC: 12 10*3/uL — ABNORMAL HIGH (ref 4.0–10.5)
nRBC: 0.3 % — ABNORMAL HIGH (ref 0.0–0.2)
nRBC: 0 % (ref 0.0–0.2)

## 2020-10-17 LAB — GLUCOSE, CAPILLARY
Glucose-Capillary: 113 mg/dL — ABNORMAL HIGH (ref 70–99)
Glucose-Capillary: 137 mg/dL — ABNORMAL HIGH (ref 70–99)
Glucose-Capillary: 129 mg/dL — ABNORMAL HIGH (ref 70–99)
Glucose-Capillary: 145 mg/dL — ABNORMAL HIGH (ref 70–99)
Glucose-Capillary: 126 mg/dL — ABNORMAL HIGH (ref 70–99)

## 2020-10-17 LAB — MAGNESIUM
Magnesium: 2.6 mg/dL — ABNORMAL HIGH (ref 1.7–2.4)
Magnesium: 2.5 mg/dL — ABNORMAL HIGH (ref 1.7–2.4)

## 2020-10-17 LAB — HEPARIN LEVEL (UNFRACTIONATED): Heparin Unfractionated: 0.27 IU/mL — ABNORMAL LOW (ref 0.30–0.70)

## 2020-10-17 MED ORDER — DEXMEDETOMIDINE HCL IN NACL 400 MCG/100ML IV SOLN: 0.0000 ug/kg/h | INTRAVENOUS | Status: DC

## 2020-10-17 MED ORDER — PANTOPRAZOLE SODIUM 40 MG IV SOLR
40.0000 mg | INTRAVENOUS | Status: DC
  Administered 2020-10-17 – 2020-10-19 (×3): 40 mg via INTRAVENOUS
  Filled 2020-10-17 (×3): qty 40

## 2020-10-17 MED ORDER — PERMETHRIN 5 % EX CREA
TOPICAL_CREAM | Freq: Once | CUTANEOUS | Status: AC
  Filled 2020-10-17: qty 60

## 2020-10-17 MED ORDER — HEPARIN (PORCINE) 25000 UT/250ML-% IV SOLN
1750.0000 [IU]/h | INTRAVENOUS | Status: DC
  Administered 2020-10-17: 1550 [IU]/h via INTRAVENOUS
  Administered 2020-10-18: 1600 [IU]/h via INTRAVENOUS
  Administered 2020-10-20: 1500 [IU]/h via INTRAVENOUS
  Administered 2020-10-21 – 2020-10-24 (×6): 1550 [IU]/h via INTRAVENOUS
  Administered 2020-10-25: 1700 [IU]/h via INTRAVENOUS
  Administered 2020-10-26: 1750 [IU]/h via INTRAVENOUS
  Administered 2020-10-26: 1800 [IU]/h via INTRAVENOUS
  Administered 2020-10-27 – 2020-10-28 (×3): 1750 [IU]/h via INTRAVENOUS
  Filled 2020-10-17 (×18): qty 250

## 2020-10-17 MED ORDER — POTASSIUM CHLORIDE 10 MEQ/100ML IV SOLN
10.0000 meq | INTRAVENOUS | Status: AC
Start: 2020-10-17 — End: 2020-10-17
  Administered 2020-10-17 (×4): 10 meq via INTRAVENOUS
  Filled 2020-10-17 (×4): qty 100

## 2020-10-17 MED FILL — Medication: Qty: 1 | Status: AC

## 2020-10-17 NOTE — Progress Notes (Signed)
DAILY PROGRESS NOTE   Patient Name: Darrell [name] Date of Encounter: 10/17/2020 Cardiologist: Minus Breeding, MD  Chief Complaint   Intubated, sedated on vent  Patient Profile   Darrell [name] is a 65 y.o. male with a hx of CAD status post CABG and PCI's, hypertension, hyperlipidemia, diabetes, CKD stage III, chronic diastolic heart failure, bedbug infestation, medical noncompliance who is being seen 10/15/2020 for the evaluation of chest pain at the request of Dr Roslynn Amble.  Subjective   VT arrest yesterday, successful resuscitation. Transfused 2U PRBC's for anemia -spoke with family at bedside yesterday. According to his wife, he has been compliant with meds, but ran out of them a few weeks ago - she was giving him meds. He had continued to refuse cath, even this admission. Echo yesterday read as no significant change compared to prior, LVEF 50-55%, however, to my read the LVEF is lower at around 40% with inferoapical and anterior hypokinesis.  Suspect coronary occlusion as the cause of VT. Troponin has risen significantly up to 6892 then 6596. Creatinine has risen to 2.64, suspect ATN. Potasium remains low at 3.4. Magnesium now 2.6. H/H improved to 9/30.  Objective   Vitals:   10/17/20 0500 10/17/20 0600 10/17/20 0700 10/17/20 0808  BP: 121/83 134/88 137/88 (!) 144/93  Pulse:    63  Resp: _0 Temp: 98.4 F (36.9 C) 98.1 F (36.7 C) (!) 97 F (36.1 C) 97.9 F (36.6 C)  TempSrc:  Esophageal    SpO2: 99% 100% 100% 100%  Weight:      Height:        Intake/Output Summary (Last 24 hours) at 10/17/2020 4709 Last data filed at 10/17/2020 0600 Gross per 24 hour  Intake 2245.08 ml  Output 863 ml  Net 1382.08 ml   Filed Weights   10/15/20 0456 10/15/20 1700  Weight: 106.6 kg 98.2 kg    Physical Exam   General appearance: Intubated, sedated on vent, desheveled, no distress Neck: no carotid bruit, no JVD, and thyroid not enlarged, symmetric, no  tenderness/mass/nodules Lungs: clear to auscultation bilaterally Heart: regular rate and rhythm Abdomen: soft, non-tender; bowel sounds normal; no masses,  no organomegaly Extremities: edema 1+  Pulses: 2+ and symmetric Skin: Skin is less pale Neurologic: Grossly normal Psych: Not assessed  Inpatient Medications    Scheduled Meds:  aspirin  81 mg Per Tube Daily   atorvastatin  80 mg Per Tube QPM   chlorhexidine gluconate (MEDLINE KIT)  15 mL Mouth Rinse BID   Chlorhexidine Gluconate Cloth  6 each Topical Daily   docusate  100 mg Per Tube BID   fentaNYL (SUBLIMAZE) injection  50 mcg Intravenous Once   insulin aspart  2-6 Units Subcutaneous Q4H   mouth rinse  15 mL Mouth Rinse 10 times per day   pantoprazole (PROTONIX) IV  40 mg Intravenous Q24H   polyethylene glycol  17 g Per Tube Daily   sodium chloride flush  10-40 mL Intracatheter Q12H    Continuous Infusions:  dexmedetomidine (PRECEDEX) IV infusion 1.2 mcg/kg/hr (10/17/20 0816)   fentaNYL infusion INTRAVENOUS 250 mcg/hr (10/17/20 0600)   norepinephrine (LEVOPHED) Adult infusion 0 mcg/min (10/16/20 1217)    PRN Meds: acetaminophen, albuterol, alum & mag hydroxide-simeth, fentaNYL, nitroGLYCERIN, ondansetron (ZOFRAN) IV, sodium chloride flush   Labs   Results for orders placed or performed during the hospital encounter of 10/15/20 (from the past 48 hour(s))  Ethanol     Status: None   Collection  Time: 10/15/20  9:27 AM  Result Value Ref Range   Alcohol, Ethyl (B) <10 <10 mg/dL    Comment: (NOTE) Lowest detectable limit for serum alcohol is 10 mg/dL.  For medical purposes only. Performed at [facility], [place] [address], [place], [place] [address]   Lactic acid, plasma     Status: None   Collection Time: 10/15/20  9:27 AM  Result Value Ref Range   Lactic Acid, Venous 1.4 0.5 - 1.9 mmol/L    Comment: Performed at [facility] [address], [place], [place] [address]  Beta-hydroxybutyric acid      Status: Abnormal   Collection Time: 10/15/20  9:27 AM  Result Value Ref Range   Beta-Hydroxybutyric Acid 5.31 (H) 0.05 - 0.27 mmol/L    Comment: RESULTS CONFIRMED BY MANUAL DILUTION Performed at [facility] [address], [place], [place] [address]   Protime-INR     Status: Abnormal   Collection Time: 10/15/20  9:27 AM  Result Value Ref Range   Prothrombin Time 15.3 (H) 11.4 - 15.2 seconds   INR 1.2 0.8 - 1.2    Comment: (NOTE) INR goal varies based on device and disease states. Performed at [facility], [place] [address] [place], [place] [address]   Blood culture (routine x 2)     Status: None (Preliminary result)   Collection Time: 10/15/20  9:28 AM   Specimen: BLOOD  Result Value Ref Range   Specimen Description BLOOD SITE NOT SPECIFIED    Special Requests      BOTTLES DRAWN AEROBIC AND ANAEROBIC Blood Culture adequate volume   Culture      NO GROWTH 2 DAYS Performed at [facility], [place] [address] [place], [place] [address]    Report Status PENDING   Blood culture (routine x 2)     Status: None (Preliminary result)   Collection Time: 10/15/20  9:29 AM   Specimen: BLOOD  Result Value Ref Range   Specimen Description BLOOD SITE NOT SPECIFIED    Special Requests      BOTTLES DRAWN AEROBIC AND ANAEROBIC Blood Culture adequate volume   Culture      NO GROWTH 2 DAYS Performed at [facility], [place] [address] [place], [place] [address]    Report Status PENDING   I-Stat venous blood gas, ED     Status: Abnormal   Collection Time: 10/15/20  9:36 AM  Result Value Ref Range   pH, Ven 7.371 7.250 - 7.430   pCO2, Ven 29.8 (L) 44.0 - 60.0 mmHg   pO2, Ven 44.0 32.0 - 45.0 mmHg   Bicarbonate 17.3 (L) 20.0 - 28.0 mmol/L   TCO2 18 (L) 22 - 32 mmol/L   O2 Saturation 79.0 %   Acid-base deficit 7.0 (H) 0.0 - 2.0 mmol/L   Sodium 138 135 - 145 mmol/L   Potassium 3.9 3.5 - 5.1 mmol/L   Calcium, Ion 1.18 1.15 - 1.40 mmol/L   HCT 26.0 (L) 39.0 - 52.0 %    Hemoglobin 8.8 (L) 13.0 - 17.0 g/dL   Sample type VENOUS   Salicylate level     Status: Abnormal   Collection Time: 10/15/20 11:54 AM  Result Value Ref Range   Salicylate Lvl <8.1 (L) 7.0 - 30.0 mg/dL    Comment: Performed at [facility] [address] [place], [place] [address]  Acetaminophen level     Status: Abnormal   Collection Time:  10/15/20 11:54 AM  Result Value Ref Range   Acetaminophen (Tylenol), Serum <10 (L) 10 - 30 ug/mL    Comment: (NOTE) Therapeutic concentrations vary significantly. A range of 10-30 ug/mL  may be an effective concentration for many patients. However, some  are best treated at concentrations outside of this range. Acetaminophen concentrations >150 ug/mL at 4 hours after ingestion  and >50 ug/mL at 12 hours after ingestion are often associated with  toxic reactions.  Performed at [facility], [place] [address] [place], [place] [address]   Blood gas, venous (at [facility] and [facility], not at [facility])     Status: Abnormal   Collection Time: 10/15/20  6:10 PM  Result Value Ref Range   FIO2 28.00    pH, Ven 7.336 7.250 - 7.430   pCO2, Ven 38.0 (L) 44.0 - 60.0 mmHg   pO2, Ven <32.0 (LL) 32.0 - 45.0 mmHg    Comment: CRITICAL RESULT CALLED TO, READ BACK BY AND VERIFIED WITH: S. KING RN 1845 10/15/20 LIN AUNG    Bicarbonate 19.7 (L) 20.0 - 28.0 mmol/L   Acid-base deficit 5.1 (H) 0.0 - 2.0 mmol/L   O2 Saturation 44.8 %   Patient temperature 37.0    Drawn by 164    Sample type VENOUS     Comment: Performed at [facility], [place] [address], [place], [place] [address]  Heparin level (unfractionated)     Status: Abnormal   Collection Time: 10/15/20  6:10 PM  Result Value Ref Range   Heparin Unfractionated 0.24 (L) 0.30 - 0.70 IU/mL    Comment: (NOTE) The clinical reportable range upper limit is being lowered to >1.10 to align with the FDA approved guidance for the current laboratory assay.  If heparin results are below expected values, and patient  dosage has  been confirmed, suggest follow up testing of antithrombin III levels. Performed at [facility], [place] [address] [place], [place] [address]   D-dimer, quantitative     Status: Abnormal   Collection Time: 10/15/20  6:10 PM  Result Value Ref Range   D-Dimer, Quant 1.07 (H) 0.00 - 0.50 ug/mL-FEU    Comment: (NOTE) At the manufacturer cut-off value of 0.5 g/mL FEU, this assay has a negative predictive value of 95-100%.This assay is intended for use in conjunction with a clinical pretest probability (PTP) assessment model to exclude pulmonary embolism (PE) and deep venous thrombosis (DVT) in outpatients suspected of PE or DVT. Results should be correlated with clinical presentation. Performed at [facility], [place] [address] [place],  [address]   MRSA Next Gen by PCR, Nasal     Status: None   Collection Time: 10/15/20  6:30 PM   Specimen: Nasal Mucosa; Nasal Swab  Result Value Ref Range   MRSA by PCR Next Gen NOT DETECTED NOT DETECTED    Comment: (NOTE) The GeneXpert MRSA Assay (FDA approved for NASAL specimens only), is one component of a comprehensive MRSA colonization surveillance program. It is not intended to diagnose MRSA infection nor to guide or monitor treatment for MRSA infections. Test performance is not FDA approved in patients less than 79 years old. Performed at [facility], [place] [address], [place] [address]   CBC     Status: Abnormal   Collection Time: 10/16/20  4:07 AM  Result Value Ref Range   WBC 7.4 4.0 - 10.5 K/uL   RBC 3.32 (L) 4.22 - 5.81 MIL/uL   Hemoglobin  7.3 (L) 13.0 - 17.0 g/dL    Comment: Reticulocyte Hemoglobin testing may be clinically indicated, consider ordering this additional test [id number]    HCT 25.2 (L) 39.0 - 52.0 %   MCV 75.9 (L) 80.0 - 100.0 fL   MCH 22.0 (L) 26.0 - 34.0 pg   MCHC 29.0 (L) 30.0 - 36.0 g/dL   RDW 17.0 (H) 11.5 - 15.5 %   Platelets 220 150 - 400 K/uL   nRBC 0.0 0.0 - 0.2  %    Comment: Performed at [facility], [place] [address], [place], [place] [address]  Basic metabolic panel     Status: Abnormal   Collection Time: 10/16/20  4:07 AM  Result Value Ref Range   Sodium 134 (L) 135 - 145 mmol/L   Potassium 3.6 3.5 - 5.1 mmol/L   Chloride 102 98 - 111 mmol/L   CO2 27 22 - 32 mmol/L   Glucose, Bld 152 (H) 70 - 99 mg/dL    Comment: Glucose reference range applies only to samples taken after fasting for at least 8 hours.   BUN 22 8 - 23 mg/dL   Creatinine, Ser 1.47 (H) 0.61 - 1.24 mg/dL   Calcium 8.6 (L) 8.9 - 10.3 mg/dL   GFR, Estimated 53 (L) >60 mL/min    Comment: (NOTE) Calculated using the CKD-EPI Creatinine Equation (2021)    Anion gap 5 5 - 15    Comment: Performed at [facility] [address] [place], [place] [address]  Heparin level (unfractionated)     Status: None   Collection Time: 10/16/20  4:07 AM  Result Value Ref Range   Heparin Unfractionated 0.37 0.30 - 0.70 IU/mL    Comment: (NOTE) The clinical reportable range upper limit is being lowered to >1.10 to align with the FDA approved guidance for the current laboratory assay.  If heparin results are below expected values, and patient dosage has  been confirmed, suggest follow up testing of antithrombin III levels. Performed at [facility], [place] [address] [place], [place] [address]   Magnesium     Status: None   Collection Time: 10/16/20  4:07 AM  Result Value Ref Range   Magnesium 1.8 1.7 - 2.4 mg/dL    Comment: Performed at [facility] [address], [place], [place] [address]  Heparin level (unfractionated)     Status: Abnormal   Collection Time: 10/16/20  9:53 AM  Result Value Ref Range   Heparin Unfractionated <0.10 (L) 0.30 - 0.70 IU/mL    Comment: (NOTE) The clinical reportable range upper limit is being lowered to >1.10 to align with the FDA approved guidance for the current laboratory assay.  If heparin results are below expected values,  and patient dosage has  been confirmed, suggest follow up testing of antithrombin III levels. Performed at [facility], [place] [address] [place],  [address]   Troponin I (High Sensitivity)     Status: Abnormal   Collection Time: 10/16/20  9:53 AM  Result Value Ref Range   Troponin I (High Sensitivity) 6,892 (HH) <18 ng/L    Comment: CRITICAL RESULT CALLED TO, READ BACK BY AND VERIFIED WITH: Terie Purser, RN 1130 10/16/20 L. KLAR (NOTE) Elevated high sensitivity troponin I (hsTnI) values and significant  changes across serial measurements may suggest ACS but many other  chronic and acute conditions are known to elevate hsTnI results.  Refer to the Links section for chest pain algorithms  and additional  guidance. Performed at [facility], [place] [address] [place], [place] [address]   Prepare RBC (crossmatch)     Status: None   Collection Time: 10/16/20 11:19 AM  Result Value Ref Range   Order Confirmation      ORDER PROCESSED BY BLOOD BANK Performed at [facility], [place] [address] [place], [place] [address]   Lactic acid, plasma     Status: None   Collection Time: 10/16/20 11:42 AM  Result Value Ref Range   Lactic Acid, Venous 1.8 0.5 - 1.9 mmol/L    Comment: Performed at [facility] [address] [place], [place] [address]  Troponin I (High Sensitivity)     Status: Abnormal   Collection Time: 10/16/20 11:43 AM  Result Value Ref Range   Troponin I (High Sensitivity) 6,596 (HH) <18 ng/L    Comment: CRITICAL VALUE NOTED.  VALUE IS CONSISTENT WITH PREVIOUSLY REPORTED AND CALLED VALUE. (NOTE) Elevated high sensitivity troponin I (hsTnI) values and significant  changes across serial measurements may suggest ACS but many other  chronic and acute conditions are known to elevate hsTnI results.  Refer to the Links section for chest pain algorithms and additional  guidance. Performed at [facility], [place] [address] [place], [place] [address]    Type and screen     Status: None   Collection Time: 10/16/20 11:53 AM  Result Value Ref Range   ABO/RH(D) O NEG    Antibody Screen NEG    Sample Expiration 10/19/2020,2359    Unit Number [id number]    Blood Component Type RED CELLS,LR    Unit division 00    Status of Unit REL FROM [facility]    Unit tag comment EMERGENCY RELEASE    Transfusion Status OK TO TRANSFUSE    Crossmatch Result NOT NEEDED    Unit Number [id number]    Blood Component Type RED CELLS,LR    Unit division 00    Status of Unit REL FROM [facility]    Unit tag comment EMERGENCY RELEASE    Transfusion Status OK TO TRANSFUSE    Crossmatch Result NOT NEEDED    Unit Number [id number]    Blood Component Type RED CELLS,LR    Unit division 00    Status of Unit ISSUED,FINAL    Transfusion Status OK TO TRANSFUSE    Crossmatch Result Compatible    Unit Number [id number]    Blood Component Type RED CELLS,LR    Unit division 00    Status of Unit ISSUED,FINAL    Transfusion Status OK TO TRANSFUSE    Crossmatch Result      Compatible Performed at [facility], [place] [address], [place], [place] [address]   I-STAT 7, (LYTES, BLD GAS, ICA, H+H)     Status: Abnormal   Collection Time: 10/16/20 12:28 PM  Result Value Ref Range   pH, Arterial 7.563 (H) 7.350 - 7.450   pCO2 arterial 30.9 (L) 32.0 - 48.0 mmHg   pO2, Arterial 438 (H) 83.0 - 108.0 mmHg   Bicarbonate 27.9 20.0 - 28.0 mmol/L   TCO2 29 22 - 32 mmol/L   O2 Saturation 100.0 %   Acid-Base Excess 5.0 (H) 0.0 - 2.0 mmol/L   Sodium 140 135 - 145 mmol/L   Potassium 3.5 3.5 - 5.1 mmol/L   Calcium, Ion 1.10 (L) 1.15 - 1.40 mmol/L   HCT 20.0 (L) 39.0 - 52.0 %   Hemoglobin 6.8 (LL) 13.0 -  17.0 g/dL   Patient temperature 98.2 F    Collection site Radial    Drawn by Operator    Sample type ARTERIAL    Comment NOTIFIED PHYSICIAN   Glucose, capillary     Status: Abnormal   Collection Time: 10/16/20 12:35 PM  Result Value Ref Range   Glucose-Capillary 148  (H) 70 - 99 mg/dL    Comment: Glucose reference range applies only to samples taken after fasting for at least 8 hours.  CBC     Status: Abnormal   Collection Time: 10/16/20  1:00 PM  Result Value Ref Range   WBC 8.3 4.0 - 10.5 K/uL   RBC 3.24 (L) 4.22 - 5.81 MIL/uL   Hemoglobin 7.1 (L) 13.0 - 17.0 g/dL    Comment: Reticulocyte Hemoglobin testing may be clinically indicated, consider ordering this additional test [id number]    HCT 24.9 (L) 39.0 - 52.0 %   MCV 76.9 (L) 80.0 - 100.0 fL   MCH 21.9 (L) 26.0 - 34.0 pg   MCHC 28.5 (L) 30.0 - 36.0 g/dL   RDW 16.9 (H) 11.5 - 15.5 %   Platelets 209 150 - 400 K/uL   nRBC 0.7 (H) 0.0 - 0.2 %    Comment: Performed at [facility], [place] [address], [name], [place] [address]  Basic metabolic panel     Status: Abnormal   Collection Time: 10/16/20  1:00 PM  Result Value Ref Range   Sodium 135 135 - 145 mmol/L   Potassium 3.3 (L) 3.5 - 5.1 mmol/L   Chloride 104 98 - 111 mmol/L   CO2 24 22 - 32 mmol/L   Glucose, Bld 195 (H) 70 - 99 mg/dL    Comment: Glucose reference range applies only to samples taken after fasting for at least 8 hours.   BUN 23 8 - 23 mg/dL   Creatinine, Ser 1.75 (H) 0.61 - 1.24 mg/dL   Calcium 7.8 (L) 8.9 - 10.3 mg/dL   GFR, Estimated 43 (L) >60 mL/min    Comment: (NOTE) Calculated using the CKD-EPI Creatinine Equation (2021)    Anion gap 7 5 - 15    Comment: Performed at [facility] [address] [place], [place] [address]  Magnesium     Status: Abnormal   Collection Time: 10/16/20  1:00 PM  Result Value Ref Range   Magnesium 1.6 (L) 1.7 - 2.4 mg/dL    Comment: Performed at [facility] [address], [place], [place] [address]  Lactic acid, plasma     Status: None   Collection Time: 10/16/20  3:41 PM  Result Value Ref Range   Lactic Acid, Venous 1.4 0.5 - 1.9 mmol/L    Comment: Performed at [facility] [address], [place], [place] [address]  Glucose, capillary     Status: Abnormal    Collection Time: 10/16/20  3:43 PM  Result Value Ref Range   Glucose-Capillary 135 (H) 70 - 99 mg/dL    Comment: Glucose reference range applies only to samples taken after fasting for at least 8 hours.  Basic metabolic panel     Status: Abnormal   Collection Time: 10/16/20  8:04 PM  Result Value Ref Range   Sodium 137 135 - 145 mmol/L   Potassium 3.4 (L) 3.5 - 5.1 mmol/L   Chloride 106 98 - 111 mmol/L   CO2 26 22 - 32 mmol/L   Glucose, Bld 118 (H) 70 - 99 mg/dL  Comment: Glucose reference range applies only to samples taken after fasting for at least 8 hours.   BUN 23 8 - 23 mg/dL   Creatinine, Ser 2.26 (H) 0.61 - 1.24 mg/dL   Calcium 8.2 (L) 8.9 - 10.3 mg/dL   GFR, Estimated 32 (L) >60 mL/min    Comment: (NOTE) Calculated using the CKD-EPI Creatinine Equation (2021)    Anion gap 5 5 - 15    Comment: Performed at [facility] [address], [place], [place] [address]  Glucose, capillary     Status: Abnormal   Collection Time: 10/16/20  8:10 PM  Result Value Ref Range   Glucose-Capillary 119 (H) 70 - 99 mg/dL    Comment: Glucose reference range applies only to samples taken after fasting for at least 8 hours.  Glucose, capillary     Status: Abnormal   Collection Time: 10/16/20 11:19 PM  Result Value Ref Range   Glucose-Capillary 129 (H) 70 - 99 mg/dL    Comment: Glucose reference range applies only to samples taken after fasting for at least 8 hours.  CBC     Status: Abnormal   Collection Time: 10/17/20  1:29 AM  Result Value Ref Range   WBC 6.0 4.0 - 10.5 K/uL   RBC 3.94 (L) 4.22 - 5.81 MIL/uL   Hemoglobin 9.2 (L) 13.0 - 17.0 g/dL    Comment: REPEATED TO VERIFY POST TRANSFUSION SPECIMEN    HCT 30.3 (L) 39.0 - 52.0 %   MCV 76.9 (L) 80.0 - 100.0 fL   MCH 23.4 (L) 26.0 - 34.0 pg   MCHC 30.4 30.0 - 36.0 g/dL   RDW 17.2 (H) 11.5 - 15.5 %   Platelets 179 150 - 400 K/uL   nRBC 0.3 (H) 0.0 - 0.2 %    Comment: Performed at [facility], [place] [address],  [place], [place] [address]  Basic metabolic panel     Status: Abnormal   Collection Time: 10/17/20  1:29 AM  Result Value Ref Range   Sodium 136 135 - 145 mmol/L   Potassium 3.4 (L) 3.5 - 5.1 mmol/L   Chloride 104 98 - 111 mmol/L   CO2 23 22 - 32 mmol/L   Glucose, Bld 127 (H) 70 - 99 mg/dL    Comment: Glucose reference range applies only to samples taken after fasting for at least 8 hours.   BUN 26 (H) 8 - 23 mg/dL   Creatinine, Ser 2.64 (H) 0.61 - 1.24 mg/dL   Calcium 8.2 (L) 8.9 - 10.3 mg/dL   GFR, Estimated 26 (L) >60 mL/min    Comment: (NOTE) Calculated using the CKD-EPI Creatinine Equation (2021)    Anion gap 9 5 - 15    Comment: Performed at [facility] [address], [place], [place] [address]  Magnesium     Status: Abnormal   Collection Time: 10/17/20  1:29 AM  Result Value Ref Range   Magnesium 2.6 (H) 1.7 - 2.4 mg/dL    Comment: Performed at [facility] [address] [place], [place] [address]  Glucose, capillary     Status: Abnormal   Collection Time: 10/17/20  4:52 AM  Result Value Ref Range   Glucose-Capillary 113 (H) 70 - 99 mg/dL    Comment: Glucose reference range applies only to samples taken after fasting for at least 8 hours.    ECG   N/A  Telemetry   Sinus rhythm - Personally Reviewed  Radiology  DG CHEST PORT 1 VIEW  Result Date: 10/16/2020 CLINICAL DATA:  Central line and enteric catheter placement EXAM: PORTABLE CHEST 1 VIEW COMPARISON:  Chest radiograph dated 1 day prior FINDINGS: The endotracheal tube is in the upper thoracic trachea approximately 7.3 cm above the carina at the level of the clavicular heads. A left central venous catheter tip is at the cavoatrial junction. The enteric catheter tip courses off the field of view, assessed on the separately dictated abdominal radiographs. There is increased opacity in the right lung since the radiographs from 1 day prior. Aeration is otherwise not significantly changed. There is no  significant pleural effusion. There is no pneumothorax. No displaced rib fracture is identified. IMPRESSION: 1. Endotracheal tube is in the upper thoracic trachea at the level of the clavicular heads. 2. Left-sided central venous catheter tip is at the cavoatrial junction. 3. Increased opacity in the right lung may reflect aspiration, pulmonary edema, or possibly contusion given the history of CPR. No displaced rib fracture is identified. Electronically Signed   By: Valetta Mole M.D.   On: 10/16/2020 11:04   DG Abd Portable 1V  Result Date: 10/16/2020 CLINICAL DATA:  Orogastric tube placement. EXAM: PORTABLE ABDOMEN - 1 VIEW COMPARISON:  None. FINDINGS: The bowel gas pattern is normal. Distal tip of nasogastric tube is seen in proximal stomach. No radio-opaque calculi or other significant radiographic abnormality are seen. IMPRESSION: Distal tip of nasogastric tube is seen in proximal stomach. Electronically Signed   By: Marijo Conception M.D.   On: 10/16/2020 11:20   ECHOCARDIOGRAM LIMITED  Result Date: 10/16/2020    ECHOCARDIOGRAM LIMITED REPORT   Patient Name:   ASPEN DETERDING Date of Exam: 10/16/2020 Medical Rec #:  [id number]         Height:       73.0 in Accession #:    [id number]        Weight:       216.5 lb Date of Birth:  Apr 20, 1955         BSA:          2.225 m Patient Age:    48 years          BP:           107/62 mmHg Patient Gender: M                 HR:           78 bpm. Exam Location:  Inpatient Procedure: Limited Echo, Cardiac Doppler and Color Doppler STAT ECHO Indications:    Cardiac arrest  History:        Patient has prior history of Echocardiogram examinations, most                 recent 09/03/2020. CHF, Acute MI and CAD, Prior CABG,                 Arrythmias:Cardiac Arrest and VT arrest, Signs/Symptoms:Anemia,                 Resp. failure, CKD; Risk Factors:Diabetes and Hypertension.  Sonographer:    Dustin Flock RDCS Referring Phys: [id number] [place]  1.  Hypokinesis of the inferior wall (base/mid) and apex. NO significant change from echo from [facility] 2022.Marland Kitchen Left ventricular ejection fraction, by estimation, is 50 to 55%. The left ventricle has low normal function. There is moderate left ventricular hypertrophy.  2. Right ventricular systolic function is normal. The right ventricular size is normal.  There is normal pulmonary artery systolic pressure.  3. The mitral valve is normal in structure. Mild mitral valve regurgitation.  4. The aortic valve is normal in structure. Aortic valve regurgitation is trivial. Mild aortic valve sclerosis is present, with no evidence of aortic valve stenosis.  5. The inferior vena cava is dilated in size with <50% respiratory variability, suggesting right atrial pressure of 15 mmHg. FINDINGS  Left Ventricle: Hypokinesis of the inferior wall (base/mid) and apex. NO significant change from echo from [facility] 2022. Left ventricular ejection fraction, by estimation, is 50 to 55%. The left ventricle has low normal function. The left ventricular internal cavity size was normal in size. There is moderate left ventricular hypertrophy. Right Ventricle: The right ventricular size is normal. Right vetricular wall thickness was not assessed. Right ventricular systolic function is normal. There is normal pulmonary artery systolic pressure. The tricuspid regurgitant velocity is 2.71 m/s, and with an assumed right atrial pressure of 3 mmHg, the estimated right ventricular systolic pressure is 32.4 mmHg. Left Atrium: Left atrial size was normal in size. Right Atrium: Right atrial size was normal in size. Pericardium: Trivial pericardial effusion is present. Mitral Valve: The mitral valve is normal in structure. Mild mitral valve regurgitation. Tricuspid Valve: The tricuspid valve is normal in structure. Tricuspid valve regurgitation is trivial. Aortic Valve: The aortic valve is normal in structure. Aortic valve regurgitation is trivial. Mild aortic valve  sclerosis is present, with no evidence of aortic valve stenosis. Pulmonic Valve: The pulmonic valve was normal in structure. Pulmonic valve regurgitation is not visualized. Aorta: The aortic root is normal in size and structure. Venous: The inferior vena cava is dilated in size with less than 50% respiratory variability, suggesting right atrial pressure of 15 mmHg. IAS/Shunts: No atrial level shunt detected by color flow Doppler. LEFT VENTRICLE PLAX 2D LVIDd:         5.40 cm      Diastology LVIDs:         4.50 cm      LV e' medial:    6.31 cm/s LV PW:         1.50 cm      LV E/e' medial:  11.5 LV IVS:        1.60 cm      LV e' lateral:   6.74 cm/s                             LV E/e' lateral: 10.7  LV Volumes (MOD) LV vol d, MOD A4C: 189.0 ml LV vol s, MOD A4C: 128.0 ml LV SV MOD A4C:     189.0 ml LEFT ATRIUM         Index LA diam:    5.00 cm 2.25 cm/m   AORTA Ao Root diam: 3.70 cm MITRAL VALVE               TRICUSPID VALVE MV Area (PHT): 3.87 cm    TR Peak grad:   29.4 mmHg MV Decel Time: 196 msec    TR Vmax:        271.00 cm/s MV E velocity: 72.30 cm/s MV A velocity: 34.40 cm/s MV E/A ratio:  2.10 Dietrich Pates MD Electronically signed by Dietrich Pates MD Signature Date/Time: 10/16/2020/4:46:06 PM    Final     Cardiac Studies   none  Assessment   Principal Problem:   NSTEMI (non-ST elevated myocardial infarction) [facility]) Active Problems:  Type 2 diabetes mellitus without complication, without long-term current use of insulin (HCC)   Adjustment disorder with depressed mood   Coronary atherosclerosis, s/p CABG in 2009    History of noncompliance with medical treatment, presenting hazards to health   Diabetes mellitus type 2 in nonobese ([place])   VT (ventricular tachycardia) ([place])   Plan   No acute events overnight, remains intubated and sedated on the vent.  We will proceed with spontaneous breathing trials today.  The plan will be after he wakes up if he is cognitively intact, we will have a discussion  about Cardiac catheterization, which she has refused in the past.  At that point if he is interested in proceeding we may consider that.  Unfortunately, he has some acute kidney injury, likely secondary to ATN.  Urine output has been quite low.  May need single high-dose Lasix to see if we could jumpstart the kidneys.  His hemoglobin has come up with blood.  We will need to monitor this closely.  Echo yesterday suggested LVEF was only 50 to 55% however it appears to me to be lower, perhaps around 40% with some anterior hypokinesis as well as apical and inferoapical more severe hypokinesis.  I discussed the situation with his wife and daughter yesterday at the bedside and will update them again today.  Time Spent Directly with Patient:  I have spent a total of 35 minutes with the patient reviewing hospital notes, telemetry, EKGs, labs and examining the patient as well as establishing an assessment and plan that was discussed personally with the patient.  > 50% of time was spent in direct patient care.  Length of Stay:  LOS: 2 days   Pixie Casino, MD, [facility], [place] Director of the Advanced Lipid Disorders &  Cardiovascular Risk Reduction Clinic Diplomate of the American Board of Clinical Lipidology Attending Cardiologist  Direct Dial: [phone]  Fax: [phone]  Website:  [email or web address] [email or web address] Montrell Cessna 10/17/2020, 9:23 AM

## 2020-10-17 NOTE — Procedures (Signed)
Extubation Procedure Note  Patient Details:   Name: Darrell [name] DOB: July 16, 1955 MRN: [id number]   Airway Documentation:   Patient extubated per MD order. Pt had positive cuff leak prior to extubation. Pt having periods of apnea prior to extubation, MD aware. Placed on 4lpm nasal cannula. RN at bedside. Vent end date: 10/17/20 Vent end time: 1216   Evaluation  O2 sats: stable throughout Complications: No apparent complications Patient did tolerate procedure well. Bilateral Breath Sounds: Clear, Diminished   Yes  Suszanne Conners 10/17/2020, 12:16 PM

## 2020-10-17 NOTE — Progress Notes (Signed)
0800- Live bed bugs noted in large discolored right great toenail.  Pulled with tweezers and placed in specimen cup, Dr.Smith and Vernona Rieger Gleason aware.

## 2020-10-17 NOTE — Progress Notes (Signed)
ANTICOAGULATION CONSULT NOTE - Follow-Up Consult  Pharmacy Consult for heparin Indication: chest pain/ACS  No Known Allergies  Patient Measurements: Height: 6\' 1"  (185.4 cm) Weight: 98.2 kg (216 lb 7.9 oz) IBW/kg (Calculated) : 79.9 Heparin Dosing Weight: 98 kg   Vital Signs: Temp: 98.1 F (36.7 C) (09/20 1200) BP: 122/72 (09/20 1800) Pulse Rate: 61 (09/20 1116)  Labs: Recent Labs    10/15/20 0750 10/15/20 0927 10/15/20 0936 10/16/20 0407 10/16/20 0953 10/16/20 1143 10/16/20 1228 10/16/20 1300 10/16/20 2004 10/17/20 0129 10/17/20 1920  HGB  --   --    < > 7.3*  --   --  6.8* 7.1*  --  9.2*  --   HCT  --   --    < > 25.2*  --   --  20.0* 24.9*  --  30.3*  --   PLT  --   --   --  220  --   --   --  209  --  179  --   LABPROT  --  15.3*  --   --   --   --   --   --   --   --   --   INR  --  1.2  --   --   --   --   --   --   --   --   --   HEPARINUNFRC  --   --    < > 0.37 <0.10*  --   --   --   --   --  0.27*  CREATININE  --   --   --  1.47*  --   --   --  1.75* 2.26* 2.64*  --   TROPONINIHS 902*  --   --   --  09/22/223,267*  --   --   --   --   --    < > = values in this interval not displayed.     Estimated Creatinine Clearance: 34.9 mL/min (A) (by C-G formula based on SCr of 2.64 mg/dL (H)).    Assessment: 49 yoM with recent NSTEMI but declined cardiac cath presents today with central chest pain. Troponins elevated. Pt was on heparin for planned 48h due to refusal of cath, now s/p VF arrest, heparin to resume pending possible change in cath plans. Hgb improved this morning - will lower goal rate and defer boluses.  Heparin level of 0.27 is subtherapeutic on heparin 1550 units/hr. No issues per RN.    Goal of Therapy:  Heparin level 0.3-0.5 units/ml Monitor platelets by anticoagulation protocol: Yes   Plan:  Increase heparin to 1600 units/hr  Check 8 hr heparin level   77, PharmD, BCPS Clinical Pharmacist 10/17/2020 8:38  PM  10/17/2020

## 2020-10-17 NOTE — Progress Notes (Signed)
ANTICOAGULATION CONSULT NOTE - Follow-Up Consult  Pharmacy Consult for heparin Indication: chest pain/ACS  No Known Allergies  Patient Measurements: Height: 6\' 1"  (185.4 cm) Weight: 98.2 kg (216 lb 7.9 oz) IBW/kg (Calculated) : 79.9 Heparin Dosing Weight: 98 kg   Vital Signs: Temp: 97.9 F (36.6 C) (09/20 0808) Temp Source: Esophageal (09/20 0600) BP: 144/93 (09/20 0808) Pulse Rate: 63 (09/20 0808)  Labs: Recent Labs    10/15/20 0750 10/15/20 0927 10/15/20 0936 10/15/20 1810 10/16/20 0407 10/16/20 0953 10/16/20 1143 10/16/20 1228 10/16/20 1300 10/16/20 2004 10/17/20 0129  HGB  --   --    < >  --  7.3*  --   --  6.8* 7.1*  --  9.2*  HCT  --   --    < >  --  25.2*  --   --  20.0* 24.9*  --  30.3*  PLT  --   --   --   --  220  --   --   --  209  --  179  LABPROT  --  15.3*  --   --   --   --   --   --   --   --   --   INR  --  1.2  --   --   --   --   --   --   --   --   --   HEPARINUNFRC  --   --   --  0.24* 0.37 <0.10*  --   --   --   --   --   CREATININE  --   --   --   --  1.47*  --   --   --  1.75* 2.26* 2.64*  TROPONINIHS 902*  --   --   --   --  10/19/20* 6,596*  --   --   --   --    < > = values in this interval not displayed.     Estimated Creatinine Clearance: 34.9 mL/min (A) (by C-G formula based on SCr of 2.64 mg/dL (H)).    Assessment: 70 yoM with recent NSTEMI but declined cardiac cath presents today with central chest pain. Troponins elevated. Pt was on heparin for planned 48h due to refusal of cath, now s/p VF arrest, heparin to resume pending possible change in cath plans. Hgb improved this morning - will lower goal rate and defer boluses.   Goal of Therapy:  Heparin level 0.3-0.5 units/ml Monitor platelets by anticoagulation protocol: Yes   Plan:  Resume heparin 1550 units/h Check heparin level in 8h  77, PharmD, [place], [facility] Clinical Pharmacist [phone] Please check AMION for all [facility] numbers 10/17/2020

## 2020-10-17 NOTE — Progress Notes (Signed)
NAME:  Darrell [name], MRN:  [id number], DOB:  1955/06/09, LOS: 2 ADMISSION DATE:  10/15/2020, CONSULTATION DATE:  10/16/20 REFERRING MD:  Earl Lagos, MD, CHIEF COMPLAINT:  Cardiac arrest   History of Present Illness:  65 year old male with combined heart failure, hx CABG in 2009 and recent NSTEMI in 08/2020 who presented with acute hypoxemic respiratory failure and  NSTEMI this admission.  Troponin [id number]. Cardiology consulted and declined cardiac cath.  Pertinent  Medical History  Combined heart failure, hx CABG, DM2  Significant Hospital Events: Including procedures, antibiotic start and stop dates in addition to other pertinent events   9/18 Admitted to IMTS for chest pain. Cardiology consulted. Declined cath. Medical management with heparin gtt 9/19 Cardiac arrest with initial rhythm VT/?torsades. Intubated peri-code. Admitted to 2H. PCCM consulted. 9/20 stable overnight, SBT/SAT this AM, anemia resolved  Interim History / Subjective:  Has been agitated with sedation wean, but responsive No acute overnight events Minimal OUP, creatinine up RN noting >5 bedbugs coming out of hyperkeratosis on R great toe No further sustained arrhythmia  Objective   Blood pressure (!) 144/93, pulse 63, temperature 97.9 F (36.6 C), resp. rate 14, height 6\' 1"  (1.854 m), weight 98.2 kg, SpO2 100 %.    Vent Mode: PRVC FiO2 (%):  [30 %-100 %] 30 % Set Rate:  [14 bmp] 14 bmp Vt Set:  [640 mL] 640 mL PEEP:  [5 cmH20] 5 cmH20 Plateau Pressure:  [16 cmH20-19 cmH20] 16 cmH20   Intake/Output Summary (Last 24 hours) at 10/17/2020 1001 Last data filed at 10/17/2020 0900 Gross per 24 hour  Intake 2245.08 ml  Output 903 ml  Net 1342.08 ml    Filed Weights   10/15/20 0456 10/15/20 1700  Weight: 106.6 kg 98.2 kg    General: Critically ill-appearing male, intubated and sedated HEENT: MM pink/moist Neuro: Examined on Precedex and fentanyl, moving toes and fingers to command, attempting to  open eyes CV: s1s2 RRR, no m/r/g PULM: On full vent support, no significant rhonchi or wheezing, vent pressures at goal GI: soft, bsx4 active  Extremities: warm/dry, no edema, right great toe toenail with significant hyperkeratosis and multiple small black insects Skin: no rashes or lesions  Resolved Hospital Problem list     Assessment & Plan:   VT arrest Chronic combined heart failure s/p CABG NSTEMI  Cardiology following, suspect coronary occlusion as the cause of VT, patient has refused cardiac cath in the past.  Awaiting extubation and discussion before proceeding to Cath Lab. LVEF 50 to 55% with hypokinesis of the inferior wall and apex no significant change from prior P: -Plan per cardiology, continue to replete electrolytes -Waking up, TTM was not indicated -Anemia is resolved, resume heparin GTT -Continue to monitor on telemetry -Discussion regarding catheterization once patient is able to participate   Anemia, acute on chronic Hg 8.8>6.8 at the time of code, no apparent bleeding Improved to 9.2 after 2 units PRBCs P: -Continue to follow CBC and monitor for signs of bleeding, hemoglobin stable resume heparin gtt   Acute hypoxemic respiratory failure secondary to arrest -SBT/SAT this morning, hopeful extubation weaning fentanyl and Precedex -titrate Vent setting to maintain SpO2 greater than or equal to 90%. -HOB elevated 30 degrees. -Plateau pressures less than 30 cm H20.  -Follow chest x-ray, ABG prn.   -Bronchial hygiene and RT/bronchodilator protocol.  AoCKD stage III Creatinine up trending from 2.2 yesterday to 2.6 today with minimal urine output P: -Continue to monitor, +1.6 L since  admission -Follow UOP renal indices and electrolytes, avoid nephrotoxins as able  HTN -Continue to hold anti-hypertensive agents  DM2  -SSI   Possible bedbugs, left great toe hyperkeratosis Discussed with pharmacy, can try permethrin but likely needs podiatry consult  once extubated  Best Practice (right click and "Reselect all SmartList Selections" daily)   Diet/type: NPO DVT prophylaxis: systemic heparin.  GI prophylaxis: PPI Lines: N/A Foley:  N/A Code Status:  full code Last date of multidisciplinary goals of care discussion [Per primary]  Labs   CBC: Recent Labs  Lab 10/15/20 0502 10/15/20 0936 10/16/20 0407 10/16/20 1228 10/16/20 1300 10/17/20 0129  WBC 8.6  --  7.4  --  8.3 6.0  HGB 8.4* 8.8* 7.3* 6.8* 7.1* 9.2*  HCT 30.7* 26.0* 25.2* 20.0* 24.9* 30.3*  MCV 80.6  --  75.9*  --  76.9* 76.9*  PLT 292  --  220  --  209 179     Basic Metabolic Panel: Recent Labs  Lab 10/15/20 0502 10/15/20 0936 10/16/20 0407 10/16/20 1228 10/16/20 1300 10/16/20 2004 10/17/20 0129  NA 135   < > 134* 140 135 137 136  K 3.8   < > 3.6 3.5 3.3* 3.4* 3.4*  CL 98  --  102  --  104 106 104  CO2 11*  --  27  --  24 26 23   GLUCOSE 179*  --  152*  --  195* 118* 127*  BUN 26*  --  22  --  23 23 26*  CREATININE 2.27*  --  1.47*  --  1.75* 2.26* 2.64*  CALCIUM 9.7  --  8.6*  --  7.8* 8.2* 8.2*  MG 2.0  --  1.8  --  1.6*  --  2.6*   < > = values in this interval not displayed.    GFR: Estimated Creatinine Clearance: 34.9 mL/min (A) (by C-G formula based on SCr of 2.64 mg/dL (H)). Recent Labs  Lab 10/15/20 0502 10/15/20 0927 10/16/20 0407 10/16/20 1142 10/16/20 1300 10/16/20 1541 10/17/20 0129  WBC 8.6  --  7.4  --  8.3  --  6.0  LATICACIDVEN  --  1.4  --  1.8  --  1.4  --      Liver Function Tests: No results for input(s): AST, ALT, ALKPHOS, BILITOT, PROT, ALBUMIN in the last 168 hours. No results for input(s): LIPASE, AMYLASE in the last 168 hours. No results for input(s): AMMONIA in the last 168 hours.  ABG    Component Value Date/Time   PHART 7.563 (H) 10/16/2020 1228   PCO2ART 30.9 (L) 10/16/2020 1228   PO2ART 438 (H) 10/16/2020 1228   HCO3 27.9 10/16/2020 1228   TCO2 29 10/16/2020 1228   ACIDBASEDEF 5.1 (H) 10/15/2020 1810    O2SAT 100.0 10/16/2020 1228      Coagulation Profile: Recent Labs  Lab 10/15/20 0927  INR 1.2     Cardiac Enzymes: No results for input(s): CKTOTAL, CKMB, CKMBINDEX, TROPONINI in the last 168 hours.  HbA1C: Hgb A1c MFr Bld  Date/Time Value Ref Range Status  09/03/2020 03:25 PM 5.6 4.8 - 5.6 % Final    Comment:    (NOTE) Pre diabetes:          5.7%-6.4%  Diabetes:              >6.4%  Glycemic control for   <7.0% adults with diabetes   05/29/2020 07:34 PM 5.4 4.8 - 5.6 % Final    Comment:    (  NOTE) Pre diabetes:          5.7%-6.4%  Diabetes:              >6.4%  Glycemic control for   <7.0% adults with diabetes     CBG: Recent Labs  Lab 10/16/20 1235 10/16/20 1543 10/16/20 2010 10/16/20 2319 10/17/20 0452  GLUCAP 148* 135* 119* 129* 113*    Review of Systems:   Unable to obtain due to critical illness  Past Medical History:  He,  has a past medical history of CKD (chronic kidney disease), stage III (HCC), Claudication (HCC), Coronary artery disease, Diabetes mellitus type 2 in nonobese (HCC), Diastolic dysfunction, Hyperlipidemia, Hypertension, Noncompliance, and Right carotid bruit.   Surgical History:   Past Surgical History:  Procedure Laterality Date   CARDIAC CATHETERIZATION N/A 04/06/2015   Procedure: Left Heart Cath and Cors/Grafts Angiography;  Surgeon: Runell Gess, MD;  Location: [facility] INVASIVE CV LAB;  Service: Cardiovascular;  Laterality: N/A;   CARDIAC CATHETERIZATION N/A 04/06/2015   Procedure: Coronary Stent Intervention;  Surgeon: Runell Gess, MD;  2.5 mm x 12 mm long Synergy DES followed by  2.5 mm x 16 mm long Synergy DES     CORONARY ARTERY BYPASS GRAFT  2009   2009 LIMA to LAD, SVG to Diag, SVG to PDA and PL, left radial to OM   CORONARY BALLOON ANGIOPLASTY N/A 05/04/2018   Procedure: CORONARY BALLOON ANGIOPLASTY;  Surgeon: Runell Gess, MD;  Location: [facility] INVASIVE CV LAB;  Service: Cardiovascular;  Laterality: N/A;   Prox CFX   LEFT HEART CATH N/A 10/24/2012   Procedure: LEFT HEART CATH;  Surgeon: Lennette Bihari, MD;  [facility] LAB;  Service: Cardiovascular;  Laterality: N/A;   LEFT HEART CATH AND CORS/GRAFTS ANGIOGRAPHY N/A 05/04/2018   Procedure: LEFT HEART CATH AND CORS/GRAFTS ANGIOGRAPHY;  Surgeon: Runell Gess, MD;  Location: [facility] INVASIVE CV LAB;  Service: Cardiovascular;  Laterality: N/A;     Social History:   reports that he has never smoked. He has never used smokeless tobacco. He reports that he does not drink alcohol and does not use drugs.   Family History:  His family history includes CAD (age of onset: 49) in his mother; Cancer in his mother; Heart failure in his father and mother; Kidney failure in his brother.   Allergies No Known Allergies   Home Medications  Prior to Admission medications   Medication Sig Start Date End Date Taking? Authorizing Provider  ferrous sulfate 325 (65 FE) MG tablet Take 1 tablet (325 mg total) by mouth 2 (two) times daily with a meal. 09/06/20 11/05/20 Yes Elgergawy, Leana Roe, MD  hydrALAZINE (APRESOLINE) 50 MG tablet Take 1 tablet (50 mg total) by mouth 3 (three) times daily. 09/06/20  Yes Elgergawy, Leana Roe, MD  amLODipine (NORVASC) 10 MG tablet Take 1 tablet (10 mg total) by mouth daily. Please make PCP appointment. 09/06/20   Elgergawy, Leana Roe, MD  aspirin 81 MG EC tablet Take 1 tablet (81 mg total) by mouth daily. Swallow whole. 09/07/20   Elgergawy, Leana Roe, MD  atorvastatin (LIPITOR) 80 MG tablet Take 1 tablet (80 mg total) by mouth every evening. 09/06/20   Elgergawy, Leana Roe, MD  carvedilol (COREG) 25 MG tablet Take 1 tablet (25 mg total) by mouth 2 (two) times daily with a meal. Please make PCP appointment. 09/06/20   Elgergawy, Leana Roe, MD  clopidogrel (PLAVIX) 75 MG tablet Take 1 tablet (75 mg total) by  mouth daily. Please make PCP appointment. 09/06/20   Elgergawy, Leana Roe, MD  isosorbide mononitrate (IMDUR) 60 MG 24 hr tablet Take 2  tablets (120 mg total) by mouth daily. Please make PCP appointment. 09/06/20   Elgergawy, Leana Roe, MD  metFORMIN (GLUCOPHAGE) 500 MG tablet Take 1 tablet (500 mg total) by mouth daily with breakfast. 09/06/20 11/05/20  Elgergawy, Leana Roe, MD  nitroGLYCERIN (NITROSTAT) 0.4 MG SL tablet Place 1 tablet (0.4 mg total) under the tongue every 5 (five) minutes as needed for chest pain. 09/06/20 10/06/20  Elgergawy, Leana Roe, MD  tamsulosin (FLOMAX) 0.4 MG CAPS capsule Take 1 capsule (0.4 mg total) by mouth daily. Please make PCP appointment. 09/06/20   Elgergawy, Leana Roe, MD     Critical care time: 45 min    CRITICAL CARE Performed by: Darcella Gasman Mcgregor Tinnon   Total critical care time: 45 minutes  Critical care time was exclusive of separately billable procedures and treating other patients.  Critical care was necessary to treat or prevent imminent or life-threatening deterioration.  Critical care was time spent personally by me on the following activities: development of treatment plan with patient and/or surrogate as well as nursing, discussions with consultants, evaluation of patient's response to treatment, examination of patient, obtaining history from patient or surrogate, ordering and performing treatments and interventions, ordering and review of laboratory studies, ordering and review of radiographic studies, pulse oximetry and re-evaluation of patient's condition.   Darcella Gasman Jettson Crable, PA-C [facility] See Amion for pager If no response to pager , please call [phone] until 7pm After 7:00 pm call Elink  [phone]

## 2020-10-18 ENCOUNTER — Other Ambulatory Visit (HOSPITAL_COMMUNITY): Payer: Medicare Other

## 2020-10-18 ENCOUNTER — Inpatient Hospital Stay (HOSPITAL_COMMUNITY): Payer: Medicare Other

## 2020-10-18 DIAGNOSIS — N179 Acute kidney failure, unspecified: Secondary | ICD-10-CM

## 2020-10-18 DIAGNOSIS — I469 Cardiac arrest, cause unspecified: Secondary | ICD-10-CM | POA: Diagnosis not present

## 2020-10-18 DIAGNOSIS — Z9119 Patient's noncompliance with other medical treatment and regimen: Secondary | ICD-10-CM | POA: Diagnosis not present

## 2020-10-18 DIAGNOSIS — I25110 Atherosclerotic heart disease of native coronary artery with unstable angina pectoris: Secondary | ICD-10-CM | POA: Diagnosis not present

## 2020-10-18 DIAGNOSIS — I214 Non-ST elevation (NSTEMI) myocardial infarction: Secondary | ICD-10-CM | POA: Diagnosis not present

## 2020-10-18 DIAGNOSIS — F4321 Adjustment disorder with depressed mood: Secondary | ICD-10-CM | POA: Diagnosis not present

## 2020-10-18 LAB — GLUCOSE, CAPILLARY
Glucose-Capillary: 114 mg/dL — ABNORMAL HIGH (ref 70–99)
Glucose-Capillary: 126 mg/dL — ABNORMAL HIGH (ref 70–99)
Glucose-Capillary: 123 mg/dL — ABNORMAL HIGH (ref 70–99)
Glucose-Capillary: 122 mg/dL — ABNORMAL HIGH (ref 70–99)
Glucose-Capillary: 129 mg/dL — ABNORMAL HIGH (ref 70–99)
Glucose-Capillary: 123 mg/dL — ABNORMAL HIGH (ref 70–99)

## 2020-10-18 LAB — BASIC METABOLIC PANEL
Anion gap: 10 (ref 5–15)
BUN: 34 mg/dL — ABNORMAL HIGH (ref 8–23)
CO2: 23 mmol/L (ref 22–32)
Calcium: 8.2 mg/dL — ABNORMAL LOW (ref 8.9–10.3)
Chloride: 103 mmol/L (ref 98–111)
Creatinine, Ser: 3.47 mg/dL — ABNORMAL HIGH (ref 0.61–1.24)
GFR, Estimated: 19 mL/min — ABNORMAL LOW (ref >60)
Glucose, Bld: 102 mg/dL — ABNORMAL HIGH (ref 70–99)
Potassium: 3.5 mmol/L (ref 3.5–5.1)
Sodium: 136 mmol/L (ref 135–145)

## 2020-10-18 LAB — CBC
HCT: 30.4 % — ABNORMAL LOW (ref 39.0–52.0)
Hemoglobin: 9.1 g/dL — ABNORMAL LOW (ref 13.0–17.0)
MCH: 23.3 pg — ABNORMAL LOW (ref 26.0–34.0)
MCHC: 29.9 g/dL — ABNORMAL LOW (ref 30.0–36.0)
MCV: 77.9 fL — ABNORMAL LOW (ref 80.0–100.0)
Platelets: 189 10*3/uL (ref 150–400)
RBC: 3.9 MIL/uL — ABNORMAL LOW (ref 4.22–5.81)
RDW: 18.3 % — ABNORMAL HIGH (ref 11.5–15.5)
WBC: 8.8 10*3/uL (ref 4.0–10.5)
nRBC: 0 % (ref 0.0–0.2)

## 2020-10-18 LAB — HEPARIN LEVEL (UNFRACTIONATED): Heparin Unfractionated: 0.42 IU/mL (ref 0.30–0.70)

## 2020-10-18 MED ORDER — ASPIRIN EC 81 MG PO TBEC
81.0000 mg | DELAYED_RELEASE_TABLET | Freq: Every day | ORAL | Status: DC
  Administered 2020-10-18 – 2020-10-31 (×14): 81 mg via ORAL
  Filled 2020-10-18 (×15): qty 1

## 2020-10-18 MED ORDER — LACTATED RINGERS IV SOLN: INTRAVENOUS | Status: DC

## 2020-10-18 MED ORDER — POTASSIUM CHLORIDE CRYS ER 20 MEQ PO TBCR
40.0000 meq | EXTENDED_RELEASE_TABLET | Freq: Two times a day (BID) | ORAL | Status: AC
  Administered 2020-10-18 (×2): 40 meq via ORAL
  Filled 2020-10-18 (×2): qty 2

## 2020-10-18 MED ORDER — INSULIN ASPART 100 UNIT/ML IJ SOLN
0.0000 [IU] | Freq: Three times a day (TID) | INTRAMUSCULAR | Status: DC
  Administered 2020-10-18 – 2020-10-20 (×6): 2 [IU] via SUBCUTANEOUS
  Administered 2020-10-21 (×2): 3 [IU] via SUBCUTANEOUS
  Administered 2020-10-22: 2 [IU] via SUBCUTANEOUS
  Administered 2020-10-22: 3 [IU] via SUBCUTANEOUS
  Administered 2020-10-23 – 2020-10-24 (×3): 2 [IU] via SUBCUTANEOUS
  Administered 2020-10-24 – 2020-10-25 (×3): 3 [IU] via SUBCUTANEOUS
  Administered 2020-10-25 (×2): 2 [IU] via SUBCUTANEOUS
  Administered 2020-10-26 (×2): 3 [IU] via SUBCUTANEOUS
  Administered 2020-10-26 – 2020-10-31 (×10): 2 [IU] via SUBCUTANEOUS
  Administered 2020-11-02: 3 [IU] via SUBCUTANEOUS

## 2020-10-18 MED ORDER — ASPIRIN 81 MG PO CHEW: 81.0000 mg | CHEWABLE_TABLET | Freq: Every day | ORAL | Status: DC

## 2020-10-18 MED ORDER — FUROSEMIDE 10 MG/ML IJ SOLN
120.0000 mg | Freq: Once | INTRAVENOUS | Status: AC
  Administered 2020-10-18: 120 mg via INTRAVENOUS
  Filled 2020-10-18: qty 2

## 2020-10-18 MED ORDER — AMIODARONE HCL IN DEXTROSE 360-4.14 MG/200ML-% IV SOLN
60.0000 mg/h | INTRAVENOUS | Status: AC
  Administered 2020-10-18: 60 mg/h via INTRAVENOUS
  Filled 2020-10-18: qty 400

## 2020-10-18 MED ORDER — ATORVASTATIN CALCIUM 80 MG PO TABS
80.0000 mg | ORAL_TABLET | Freq: Every evening | ORAL | Status: DC
  Administered 2020-10-18 – 2020-11-01 (×14): 80 mg via ORAL
  Filled 2020-10-18 (×14): qty 1

## 2020-10-18 MED ORDER — AMIODARONE HCL IN DEXTROSE 360-4.14 MG/200ML-% IV SOLN
30.0000 mg/h | INTRAVENOUS | Status: DC
  Administered 2020-10-18 – 2020-10-19 (×2): 30 mg/h via INTRAVENOUS
  Filled 2020-10-18: qty 200

## 2020-10-18 NOTE — Progress Notes (Signed)
DAILY PROGRESS NOTE   Patient Name: Darrell [name] Date of Encounter: 10/18/2020 Cardiologist: Minus Breeding, MD  Chief Complaint   Awake, NAD  Patient Profile   Darrell [name] is a 65 y.o. male with a hx of CAD status post CABG and PCI's, hypertension, hyperlipidemia, diabetes, CKD stage III, chronic diastolic heart failure, bedbug infestation, medical noncompliance who is being seen 10/15/2020 for the evaluation of chest pain at the request of Dr Roslynn Amble.  Subjective   No issues overnight. Extubated yesterday, up in a chair. Discussed situation with family and patient yesterday, he is now agreeable to cath. Unfortunately, d/t AKI, Darrell [name] will likely have to be at a later date. Creatinine now up to 3.47. Hemoglobin remains stable on IV heparin. Weight is trending up - he is almost +3L and LVEF is moderately reduced, will need diuresis.  Objective   Vitals:   10/18/20 0000 10/18/20 0200 10/18/20 0300 10/18/20 0416  BP: (!) 146/82 135/78 (!) 141/75   Pulse:      Resp: (!) $RemoveB'22 16 19   'PqRrZvTO$ Temp:    100.3 F (37.9 C)  TempSrc:    Oral  SpO2: 96% 90% 99%   Weight:      Height:        Intake/Output Summary (Last 24 hours) at 10/18/2020 0827 Last data filed at 10/18/2020 0600 Gross per 24 hour  Intake 1497.73 ml  Output 310 ml  Net 1187.73 ml   Filed Weights   10/15/20 0456 10/15/20 1700  Weight: 106.6 kg 98.2 kg    Physical Exam   General appearance: alert, no distress, and slowed mentation Neck: JVD - 3 cm above sternal notch, no carotid bruit, and thyroid not enlarged, symmetric, no tenderness/mass/nodules Lungs: diminished breath sounds bibasilar Heart: regular rate and rhythm Abdomen: soft, non-tender; bowel sounds normal; no masses,  no organomegaly Extremities: extremities normal, atraumatic, no cyanosis or edema Pulses: 2+ and symmetric Skin: pale, warm, dry Neurologic: Mental status: awake, slowed mentation Psych: Flat  Inpatient Medications    Scheduled  Meds:  aspirin EC  81 mg Oral Daily   atorvastatin  80 mg Oral QPM   chlorhexidine gluconate (MEDLINE KIT)  15 mL Mouth Rinse BID   Chlorhexidine Gluconate Cloth  6 each Topical Daily   insulin aspart  0-15 Units Subcutaneous TID WC   mouth rinse  15 mL Mouth Rinse 10 times per day   pantoprazole (PROTONIX) IV  40 mg Intravenous Q24H   potassium chloride  40 mEq Oral BID   sodium chloride flush  10-40 mL Intracatheter Q12H    Continuous Infusions:  amiodarone     amiodarone     heparin 1,600 Units/hr (10/18/20 0600)   lactated ringers      PRN Meds: acetaminophen, albuterol, alum & mag hydroxide-simeth, nitroGLYCERIN, ondansetron (ZOFRAN) IV, sodium chloride flush   Labs   Results for orders placed or performed during the hospital encounter of 10/15/20 (from the past 48 hour(s))  Heparin level (unfractionated)     Status: Abnormal   Collection Time: 10/16/20  9:53 AM  Result Value Ref Range   Heparin Unfractionated <0.10 (L) 0.30 - 0.70 IU/mL    Comment: (NOTE) The clinical reportable range upper limit is being lowered to >1.10 to align with the FDA approved guidance for the current laboratory assay.  If heparin results are below expected values, and patient dosage has  been confirmed, suggest follow up testing of antithrombin III levels. Performed at [facility], [address] [place], [place] [address]   Troponin I (High Sensitivity)     Status: Abnormal   Collection Time: 10/16/20  9:53 AM  Result Value Ref Range   Troponin I (High Sensitivity) 6,892 (HH) <18 ng/L    Comment: CRITICAL RESULT CALLED TO, READ BACK BY AND VERIFIED WITH: Terie Purser, RN 1130 10/16/20 L. KLAR (NOTE) Elevated high sensitivity troponin I (hsTnI) values and significant  changes across serial measurements may suggest ACS but many other  chronic and acute conditions are known to elevate hsTnI results.  Refer to the Links section for chest pain algorithms and additional   guidance. Performed at [facility], [place] [address], [place], [place] [address]   Prepare RBC (crossmatch)     Status: None   Collection Time: 10/16/20 11:19 AM  Result Value Ref Range   Order Confirmation      ORDER PROCESSED BY BLOOD BANK Performed at [facility], [place] [address], [place], [place] [address]   Lactic acid, plasma     Status: None   Collection Time: 10/16/20 11:42 AM  Result Value Ref Range   Lactic Acid, Venous 1.8 0.5 - 1.9 mmol/L    Comment: Performed at [facility] [address] [place], [place] [address]  Troponin I (High Sensitivity)     Status: Abnormal   Collection Time: 10/16/20 11:43 AM  Result Value Ref Range   Troponin I (High Sensitivity) 6,596 (HH) <18 ng/L    Comment: CRITICAL VALUE NOTED.  VALUE IS CONSISTENT WITH PREVIOUSLY REPORTED AND CALLED VALUE. (NOTE) Elevated high sensitivity troponin I (hsTnI) values and significant  changes across serial measurements may suggest ACS but many other  chronic and acute conditions are known to elevate hsTnI results.  Refer to the Links section for chest pain algorithms and additional  guidance. Performed at [facility], [place] [address] [place],  [address]   Type and screen     Status: None   Collection Time: 10/16/20 11:53 AM  Result Value Ref Range   ABO/RH(D) O NEG    Antibody Screen NEG    Sample Expiration 10/19/2020,2359    Unit Number [id number]    Blood Component Type RED CELLS,LR    Unit division 00    Status of Unit REL FROM [facility]    Unit tag comment EMERGENCY RELEASE    Transfusion Status OK TO TRANSFUSE    Crossmatch Result NOT NEEDED    Unit Number [id number]    Blood Component Type RED CELLS,LR    Unit division 00    Status of Unit REL FROM [facility]    Unit tag comment EMERGENCY RELEASE    Transfusion Status OK TO TRANSFUSE    Crossmatch Result NOT NEEDED    Unit Number [id number]    Blood Component Type RED CELLS,LR    Unit division 00     Status of Unit ISSUED,FINAL    Transfusion Status OK TO TRANSFUSE    Crossmatch Result Compatible    Unit Number [id number]    Blood Component Type RED CELLS,LR    Unit division 00    Status of Unit ISSUED,FINAL    Transfusion Status OK TO TRANSFUSE    Crossmatch Result      Compatible Performed at [facility], [place] [address], [place], [place] [address]   I-STAT 7, (LYTES, BLD GAS, ICA, H+H)     Status: Abnormal   Collection Time: 10/16/20 12:28 PM  Result  Value Ref Range   pH, Arterial 7.563 (H) 7.350 - 7.450   pCO2 arterial 30.9 (L) 32.0 - 48.0 mmHg   pO2, Arterial 438 (H) 83.0 - 108.0 mmHg   Bicarbonate 27.9 20.0 - 28.0 mmol/L   TCO2 29 22 - 32 mmol/L   O2 Saturation 100.0 %   Acid-Base Excess 5.0 (H) 0.0 - 2.0 mmol/L   Sodium 140 135 - 145 mmol/L   Potassium 3.5 3.5 - 5.1 mmol/L   Calcium, Ion 1.10 (L) 1.15 - 1.40 mmol/L   HCT 20.0 (L) 39.0 - 52.0 %   Hemoglobin 6.8 (LL) 13.0 - 17.0 g/dL   Patient temperature 98.2 F    Collection site Radial    Drawn by Operator    Sample type ARTERIAL    Comment NOTIFIED PHYSICIAN   Glucose, capillary     Status: Abnormal   Collection Time: 10/16/20 12:35 PM  Result Value Ref Range   Glucose-Capillary 148 (H) 70 - 99 mg/dL    Comment: Glucose reference range applies only to samples taken after fasting for at least 8 hours.  CBC     Status: Abnormal   Collection Time: 10/16/20  1:00 PM  Result Value Ref Range   WBC 8.3 4.0 - 10.5 K/uL   RBC 3.24 (L) 4.22 - 5.81 MIL/uL   Hemoglobin 7.1 (L) 13.0 - 17.0 g/dL    Comment: Reticulocyte Hemoglobin testing may be clinically indicated, consider ordering this additional test [id number]    HCT 24.9 (L) 39.0 - 52.0 %   MCV 76.9 (L) 80.0 - 100.0 fL   MCH 21.9 (L) 26.0 - 34.0 pg   MCHC 28.5 (L) 30.0 - 36.0 g/dL   RDW 16.9 (H) 11.5 - 15.5 %   Platelets 209 150 - 400 K/uL   nRBC 0.7 (H) 0.0 - 0.2 %    Comment: Performed at [facility], [place] [address], [place], [place]  [address]  Basic metabolic panel     Status: Abnormal   Collection Time: 10/16/20  1:00 PM  Result Value Ref Range   Sodium 135 135 - 145 mmol/L   Potassium 3.3 (L) 3.5 - 5.1 mmol/L   Chloride 104 98 - 111 mmol/L   CO2 24 22 - 32 mmol/L   Glucose, Bld 195 (H) 70 - 99 mg/dL    Comment: Glucose reference range applies only to samples taken after fasting for at least 8 hours.   BUN 23 8 - 23 mg/dL   Creatinine, Ser 1.75 (H) 0.61 - 1.24 mg/dL   Calcium 7.8 (L) 8.9 - 10.3 mg/dL   GFR, Estimated 43 (L) >60 mL/min    Comment: (NOTE) Calculated using the CKD-EPI Creatinine Equation (2021)    Anion gap 7 5 - 15    Comment: Performed at [facility] [address] [place], [place] [address]  Magnesium     Status: Abnormal   Collection Time: 10/16/20  1:00 PM  Result Value Ref Range   Magnesium 1.6 (L) 1.7 - 2.4 mg/dL    Comment: Performed at [facility] [address] [place], [place] [address]  Lactic acid, plasma     Status: None   Collection Time: 10/16/20  3:41 PM  Result Value Ref Range   Lactic Acid, Venous 1.4 0.5 - 1.9 mmol/L    Comment: Performed at [facility] [address], [place], [place] [address]  Glucose, capillary     Status: Abnormal   Collection  Time: 10/16/20  3:43 PM  Result Value Ref Range   Glucose-Capillary 135 (H) 70 - 99 mg/dL    Comment: Glucose reference range applies only to samples taken after fasting for at least 8 hours.  Basic metabolic panel     Status: Abnormal   Collection Time: 10/16/20  8:04 PM  Result Value Ref Range   Sodium 137 135 - 145 mmol/L   Potassium 3.4 (L) 3.5 - 5.1 mmol/L   Chloride 106 98 - 111 mmol/L   CO2 26 22 - 32 mmol/L   Glucose, Bld 118 (H) 70 - 99 mg/dL    Comment: Glucose reference range applies only to samples taken after fasting for at least 8 hours.   BUN 23 8 - 23 mg/dL   Creatinine, Ser 2.26 (H) 0.61 - 1.24 mg/dL   Calcium 8.2 (L) 8.9 - 10.3 mg/dL   GFR, Estimated 32 (L) >60 mL/min    Comment:  (NOTE) Calculated using the CKD-EPI Creatinine Equation (2021)    Anion gap 5 5 - 15    Comment: Performed at [facility] [address], [place], [place] [address]  Glucose, capillary     Status: Abnormal   Collection Time: 10/16/20  8:10 PM  Result Value Ref Range   Glucose-Capillary 119 (H) 70 - 99 mg/dL    Comment: Glucose reference range applies only to samples taken after fasting for at least 8 hours.  Glucose, capillary     Status: Abnormal   Collection Time: 10/16/20 11:19 PM  Result Value Ref Range   Glucose-Capillary 129 (H) 70 - 99 mg/dL    Comment: Glucose reference range applies only to samples taken after fasting for at least 8 hours.  CBC     Status: Abnormal   Collection Time: 10/17/20  1:29 AM  Result Value Ref Range   WBC 6.0 4.0 - 10.5 K/uL   RBC 3.94 (L) 4.22 - 5.81 MIL/uL   Hemoglobin 9.2 (L) 13.0 - 17.0 g/dL    Comment: REPEATED TO VERIFY POST TRANSFUSION SPECIMEN    HCT 30.3 (L) 39.0 - 52.0 %   MCV 76.9 (L) 80.0 - 100.0 fL   MCH 23.4 (L) 26.0 - 34.0 pg   MCHC 30.4 30.0 - 36.0 g/dL   RDW 17.2 (H) 11.5 - 15.5 %   Platelets 179 150 - 400 K/uL   nRBC 0.3 (H) 0.0 - 0.2 %    Comment: Performed at [facility], [place] [address], [place], [place] [address]  Basic metabolic panel     Status: Abnormal   Collection Time: 10/17/20  1:29 AM  Result Value Ref Range   Sodium 136 135 - 145 mmol/L   Potassium 3.4 (L) 3.5 - 5.1 mmol/L   Chloride 104 98 - 111 mmol/L   CO2 23 22 - 32 mmol/L   Glucose, Bld 127 (H) 70 - 99 mg/dL    Comment: Glucose reference range applies only to samples taken after fasting for at least 8 hours.   BUN 26 (H) 8 - 23 mg/dL   Creatinine, Ser 2.64 (H) 0.61 - 1.24 mg/dL   Calcium 8.2 (L) 8.9 - 10.3 mg/dL   GFR, Estimated 26 (L) >60 mL/min    Comment: (NOTE) Calculated using the CKD-EPI Creatinine Equation (2021)    Anion gap 9 5 - 15    Comment: Performed at [facility] [address] [place], [place] [address]   Magnesium     Status: Abnormal  Collection Time: 10/17/20  1:29 AM  Result Value Ref Range   Magnesium 2.6 (H) 1.7 - 2.4 mg/dL    Comment: Performed at [facility] [address], [place], [place] [address]  Glucose, capillary     Status: Abnormal   Collection Time: 10/17/20  4:52 AM  Result Value Ref Range   Glucose-Capillary 113 (H) 70 - 99 mg/dL    Comment: Glucose reference range applies only to samples taken after fasting for at least 8 hours.  Glucose, capillary     Status: Abnormal   Collection Time: 10/17/20  8:35 AM  Result Value Ref Range   Glucose-Capillary 137 (H) 70 - 99 mg/dL    Comment: Glucose reference range applies only to samples taken after fasting for at least 8 hours.  Glucose, capillary     Status: Abnormal   Collection Time: 10/17/20 11:44 AM  Result Value Ref Range   Glucose-Capillary 129 (H) 70 - 99 mg/dL    Comment: Glucose reference range applies only to samples taken after fasting for at least 8 hours.  Glucose, capillary     Status: Abnormal   Collection Time: 10/17/20  4:03 PM  Result Value Ref Range   Glucose-Capillary 145 (H) 70 - 99 mg/dL    Comment: Glucose reference range applies only to samples taken after fasting for at least 8 hours.  Heparin level (unfractionated)     Status: Abnormal   Collection Time: 10/17/20  7:20 PM  Result Value Ref Range   Heparin Unfractionated 0.27 (L) 0.30 - 0.70 IU/mL    Comment: (NOTE) The clinical reportable range upper limit is being lowered to >1.10 to align with the FDA approved guidance for the current laboratory assay.  If heparin results are below expected values, and patient dosage has  been confirmed, suggest follow up testing of antithrombin III levels. Performed at [facility], [place] [address], [place], [place] [address]   Glucose, capillary     Status: Abnormal   Collection Time: 10/17/20  8:39 PM  Result Value Ref Range   Glucose-Capillary 126 (H) 70 - 99 mg/dL    Comment:  Glucose reference range applies only to samples taken after fasting for at least 8 hours.  Basic metabolic panel     Status: Abnormal   Collection Time: 10/17/20 10:46 PM  Result Value Ref Range   Sodium 138 135 - 145 mmol/L   Potassium 4.1 3.5 - 5.1 mmol/L   Chloride 106 98 - 111 mmol/L   CO2 23 22 - 32 mmol/L   Glucose, Bld 99 70 - 99 mg/dL    Comment: Glucose reference range applies only to samples taken after fasting for at least 8 hours.   BUN 33 (H) 8 - 23 mg/dL   Creatinine, Ser 3.33 (H) 0.61 - 1.24 mg/dL    Comment: DELTA CHECK NOTED   Calcium 8.3 (L) 8.9 - 10.3 mg/dL   GFR, Estimated 20 (L) >60 mL/min    Comment: (NOTE) Calculated using the CKD-EPI Creatinine Equation (2021)    Anion gap 9 5 - 15    Comment: Performed at [facility] [address], [place], [place] [address]  Magnesium     Status: Abnormal   Collection Time: 10/17/20 10:46 PM  Result Value Ref Range   Magnesium 2.5 (H) 1.7 - 2.4 mg/dL    Comment: Performed at [facility] [address], [place], [place] [address]  CBC  Status: Abnormal   Collection Time: 10/17/20 10:46 PM  Result Value Ref Range   WBC 12.0 (H) 4.0 - 10.5 K/uL   RBC 4.34 4.22 - 5.81 MIL/uL   Hemoglobin 10.1 (L) 13.0 - 17.0 g/dL   HCT 34.3 (L) 39.0 - 52.0 %   MCV 79.0 (L) 80.0 - 100.0 fL   MCH 23.3 (L) 26.0 - 34.0 pg   MCHC 29.4 (L) 30.0 - 36.0 g/dL   RDW 18.1 (H) 11.5 - 15.5 %   Platelets 223 150 - 400 K/uL   nRBC 0.0 0.0 - 0.2 %    Comment: Performed at [facility], [place] [address], [place], [place] [address]  Glucose, capillary     Status: Abnormal   Collection Time: 10/17/20 11:57 PM  Result Value Ref Range   Glucose-Capillary 114 (H) 70 - 99 mg/dL    Comment: Glucose reference range applies only to samples taken after fasting for at least 8 hours.  Glucose, capillary     Status: Abnormal   Collection Time: 10/18/20  4:13 AM  Result Value Ref Range   Glucose-Capillary 126 (H) 70 - 99 mg/dL     Comment: Glucose reference range applies only to samples taken after fasting for at least 8 hours.  CBC     Status: Abnormal   Collection Time: 10/18/20  5:17 AM  Result Value Ref Range   WBC 8.8 4.0 - 10.5 K/uL   RBC 3.90 (L) 4.22 - 5.81 MIL/uL   Hemoglobin 9.1 (L) 13.0 - 17.0 g/dL   HCT 30.4 (L) 39.0 - 52.0 %   MCV 77.9 (L) 80.0 - 100.0 fL   MCH 23.3 (L) 26.0 - 34.0 pg   MCHC 29.9 (L) 30.0 - 36.0 g/dL   RDW 18.3 (H) 11.5 - 15.5 %   Platelets 189 150 - 400 K/uL   nRBC 0.0 0.0 - 0.2 %    Comment: Performed at [facility], [place] [address], [place], [place] [address]  Basic metabolic panel     Status: Abnormal   Collection Time: 10/18/20  5:17 AM  Result Value Ref Range   Sodium 136 135 - 145 mmol/L   Potassium 3.5 3.5 - 5.1 mmol/L   Chloride 103 98 - 111 mmol/L   CO2 23 22 - 32 mmol/L   Glucose, Bld 102 (H) 70 - 99 mg/dL    Comment: Glucose reference range applies only to samples taken after fasting for at least 8 hours.   BUN 34 (H) 8 - 23 mg/dL   Creatinine, Ser 3.47 (H) 0.61 - 1.24 mg/dL   Calcium 8.2 (L) 8.9 - 10.3 mg/dL   GFR, Estimated 19 (L) >60 mL/min    Comment: (NOTE) Calculated using the CKD-EPI Creatinine Equation (2021)    Anion gap 10 5 - 15    Comment: Performed at [facility] [address] [place], [place] [address]  Heparin level (unfractionated)     Status: None   Collection Time: 10/18/20  5:17 AM  Result Value Ref Range   Heparin Unfractionated 0.42 0.30 - 0.70 IU/mL    Comment: (NOTE) The clinical reportable range upper limit is being lowered to >1.10 to align with the FDA approved guidance for the current laboratory assay.  If heparin results are below expected values, and patient dosage has  been confirmed, suggest follow up testing of antithrombin III levels. Performed at [facility], [place] [address] [place], [place] [address]  ECG   N/A  Telemetry   Sinus rhythm with 8 beat run of NSVT - Personally  Reviewed  Radiology    DG CHEST PORT 1 VIEW  Result Date: 10/16/2020 CLINICAL DATA:  Central line and enteric catheter placement EXAM: PORTABLE CHEST 1 VIEW COMPARISON:  Chest radiograph dated 1 day prior FINDINGS: The endotracheal tube is in the upper thoracic trachea approximately 7.3 cm above the carina at the level of the clavicular heads. A left central venous catheter tip is at the cavoatrial junction. The enteric catheter tip courses off the field of view, assessed on the separately dictated abdominal radiographs. There is increased opacity in the right lung since the radiographs from 1 day prior. Aeration is otherwise not significantly changed. There is no significant pleural effusion. There is no pneumothorax. No displaced rib fracture is identified. IMPRESSION: 1. Endotracheal tube is in the upper thoracic trachea at the level of the clavicular heads. 2. Left-sided central venous catheter tip is at the cavoatrial junction. 3. Increased opacity in the right lung may reflect aspiration, pulmonary edema, or possibly contusion given the history of CPR. No displaced rib fracture is identified. Electronically Signed   By: Valetta Mole M.D.   On: 10/16/2020 11:04   DG Abd Portable 1V  Result Date: 10/16/2020 CLINICAL DATA:  Orogastric tube placement. EXAM: PORTABLE ABDOMEN - 1 VIEW COMPARISON:  None. FINDINGS: The bowel gas pattern is normal. Distal tip of nasogastric tube is seen in proximal stomach. No radio-opaque calculi or other significant radiographic abnormality are seen. IMPRESSION: Distal tip of nasogastric tube is seen in proximal stomach. Electronically Signed   By: Marijo Conception M.D.   On: 10/16/2020 11:20   ECHOCARDIOGRAM LIMITED  Result Date: 10/16/2020    ECHOCARDIOGRAM LIMITED REPORT   Patient Name:   Darrell [name] Date of Exam: 10/16/2020 Medical Rec #:  [id number]         Height:       73.0 in Accession #:    [id number]        Weight:       216.5 lb Date of Birth:  Jun 19, 1955          BSA:          2.225 m Patient Age:    46 years          BP:           107/62 mmHg Patient Gender: M                 HR:           78 bpm. Exam Location:  Inpatient Procedure: Limited Echo, Cardiac Doppler and Color Doppler STAT ECHO Indications:    Cardiac arrest  History:        Patient has prior history of Echocardiogram examinations, most                 recent 09/03/2020. CHF, Acute MI and CAD, Prior CABG,                 Arrythmias:Cardiac Arrest and VT arrest, Signs/Symptoms:Anemia,                 Resp. failure, CKD; Risk Factors:Diabetes and Hypertension.  Sonographer:    Dustin Flock RDCS Referring Phys: [id number] [place]  1. Hypokinesis of the inferior wall (base/mid) and apex. NO significant change from echo from [facility] 2022.Marland Kitchen Left ventricular ejection fraction, by estimation, is 50 to 55%. The left ventricle  has low normal function. There is moderate left ventricular hypertrophy.  2. Right ventricular systolic function is normal. The right ventricular size is normal. There is normal pulmonary artery systolic pressure.  3. The mitral valve is normal in structure. Mild mitral valve regurgitation.  4. The aortic valve is normal in structure. Aortic valve regurgitation is trivial. Mild aortic valve sclerosis is present, with no evidence of aortic valve stenosis.  5. The inferior vena cava is dilated in size with <50% respiratory variability, suggesting right atrial pressure of 15 mmHg. FINDINGS  Left Ventricle: Hypokinesis of the inferior wall (base/mid) and apex. NO significant change from echo from [facility] 2022. Left ventricular ejection fraction, by estimation, is 50 to 55%. The left ventricle has low normal function. The left ventricular internal cavity size was normal in size. There is moderate left ventricular hypertrophy. Right Ventricle: The right ventricular size is normal. Right vetricular wall thickness was not assessed. Right ventricular systolic function is normal. There is  normal pulmonary artery systolic pressure. The tricuspid regurgitant velocity is 2.71 m/s, and with an assumed right atrial pressure of 3 mmHg, the estimated right ventricular systolic pressure is 32.1 mmHg. Left Atrium: Left atrial size was normal in size. Right Atrium: Right atrial size was normal in size. Pericardium: Trivial pericardial effusion is present. Mitral Valve: The mitral valve is normal in structure. Mild mitral valve regurgitation. Tricuspid Valve: The tricuspid valve is normal in structure. Tricuspid valve regurgitation is trivial. Aortic Valve: The aortic valve is normal in structure. Aortic valve regurgitation is trivial. Mild aortic valve sclerosis is present, with no evidence of aortic valve stenosis. Pulmonic Valve: The pulmonic valve was normal in structure. Pulmonic valve regurgitation is not visualized. Aorta: The aortic root is normal in size and structure. Venous: The inferior vena cava is dilated in size with less than 50% respiratory variability, suggesting right atrial pressure of 15 mmHg. IAS/Shunts: No atrial level shunt detected by color flow Doppler. LEFT VENTRICLE PLAX 2D LVIDd:         5.40 cm      Diastology LVIDs:         4.50 cm      LV e' medial:    6.31 cm/s LV PW:         1.50 cm      LV E/e' medial:  11.5 LV IVS:        1.60 cm      LV e' lateral:   6.74 cm/s                             LV E/e' lateral: 10.7  LV Volumes (MOD) LV vol d, MOD A4C: 189.0 ml LV vol s, MOD A4C: 128.0 ml LV SV MOD A4C:     189.0 ml LEFT ATRIUM         Index LA diam:    5.00 cm 2.25 cm/m   AORTA Ao Root diam: 3.70 cm MITRAL VALVE               TRICUSPID VALVE MV Area (PHT): 3.87 cm    TR Peak grad:   29.4 mmHg MV Decel Time: 196 msec    TR Vmax:        271.00 cm/s MV E velocity: 72.30 cm/s MV A velocity: 34.40 cm/s MV E/A ratio:  2.10 Dorris Carnes MD Electronically signed by Dorris Carnes MD Signature Date/Time: 10/16/2020/4:46:06 PM    Final  Cardiac Studies   none  Assessment   Principal  Problem:   NSTEMI (non-ST elevated myocardial infarction) ([facility]) Active Problems:   Type 2 diabetes mellitus without complication, without long-term current use of insulin (H[facility])   Adjustment disorder with depressed mood   Coronary atherosclerosis, s/p CABG in 2009    History of noncompliance with medical treatment, presenting hazards to health   Diabetes mellitus type 2 in nonobese ([facility])   Anemia   Chest pain   VT (ventricular tachycardia) ([facility])   Plan   He new seems agreeable to cath, but his mentation is still slow. Creatinine continues to climb, likely ATN in the setting of VT arrest/hypotension. He is oliguric but urine output improving somewhat.  Will d/c LR fluids - give single high dose lasix today and monitor response. Continue heparin and amiodarone - may switch to po amiodarone tomorrow if ectopy subsides. Plan to move out of ICU to hospitalist service, I agree- keep on telemetry.  Time Spent Directly with Patient:  I have spent a total of 35 minutes with the patient reviewing hospital notes, telemetry, EKGs, labs and examining the patient as well as establishing an assessment and plan that was discussed personally with the patient.  > 50% of time was spent in direct patient care.  Length of Stay:  LOS: 3 days   Pixie Casino, MD, [facility], [place] Director of the [facility]  [facility] Diplomate of the American Board of Clinical Lipidology Attending Cardiologist  Direct Dial: [phone]  Fax: [phone]  Website:  [email or web address] Waris [email or web address] 10/18/2020, 8:27 AM

## 2020-10-18 NOTE — Progress Notes (Signed)
   NAME:  Darrell [name], MRN:  [id number], DOB:  [birth year], LOS: 3 ADMISSION DATE:  10/15/2020, CONSULTATION DATE:  10/16/20 REFERRING MD:  Earl Lagos, MD, CHIEF COMPLAINT:  Cardiac arrest   History of Present Illness:  65 year old male with combined heart failure, hx CABG in 2009 and recent NSTEMI in 08/2020 who presented with acute hypoxemic respiratory failure and  NSTEMI this admission.  Troponin [id number]. Cardiology consulted and declined cardiac cath.  Pertinent  Medical History  Combined heart failure, hx CABG, DM2  Significant Hospital Events: Including procedures, antibiotic start and stop dates in addition to other pertinent events   9/18 Admitted to IMTS for chest pain. Cardiology consulted. Declined cath. Medical management with heparin gtt 9/19 Cardiac arrest with initial rhythm VT/?torsades. Intubated peri-code. Admitted to 2H. PCCM consulted. 9/20 stable overnight, SBT/SAT this AM, anemia resolved, extubated  Interim History / Subjective:  No events. Eating breakfast.  Objective   Blood pressure (!) 141/75, pulse 61, temperature 100.3 F (37.9 C), temperature source Oral, resp. rate 19, height 6\' 1"  (1.854 m), weight 98.2 kg, SpO2 99 %.    Vent Mode: CPAP;PSV FiO2 (%):  [30 %] 30 % Set Rate:  [14 bmp] 14 bmp Vt Set:  [640 mL] 640 mL PEEP:  [5 cmH20] 5 cmH20 Pressure Support:  [12 cmH20] 12 cmH20 Plateau Pressure:  [16 cmH20] 16 cmH20   Intake/Output Summary (Last 24 hours) at 10/18/2020 0708 Last data filed at 10/18/2020 0600 Gross per 24 hour  Intake 1552.17 ml  Output 310 ml  Net 1242.17 ml    Filed Weights   10/15/20 0456 10/15/20 1700  Weight: 106.6 kg 98.2 kg    Constitutional: chronically ill appearing man in NAD  Eyes: pupils equal, reactive Ears, nose, mouth, and throat: MMM, trachea midline Cardiovascular: RRR, ext warm, sinus on monitor Respiratory: Clear, no wheezing Gastrointestinal: Soft, +BS Skin: No rashes, normal turgor, stable  toenail issues Neurologic: moves all 4 ext to command Psychiatric: RASS 0  BUN/Cr essentially stable but higher than baseline CBC stable  Resolved Hospital Problem list     Assessment & Plan:   VT arrest 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, multiple PCIs NSTEMI  Anemia, acute on chronic- unclear etiology, stable now Acute hypoxemic respiratory failure secondary to arrest- extubated, minimal O2 need AoCKD stage III- remains an issue HTN/DM2 Bedbugs, left great toe hyperkeratosis  -Patient now agreeable to cath; will need to wait until kidney function improves for this -Amio gtt given ectopy overnight, heparin -Probably needs cath before podiatry can consider debridement -For now, encourage IS, mobility, PT/OT, avoid nephrotoxins and hope for renal recovery -Stable for transfer to progressive, appreciate TRH taking over starting 9/22  Best Practice (right click and "Reselect all SmartList Selections" daily)   Diet/type: cardiac DVT prophylaxis: systemic heparin.  GI prophylaxis: PPI Lines: N/A Foley:  N/A Code Status:  full code Last date of multidisciplinary goals of care discussion [n/a]  10/22 MD PCCM

## 2020-10-18 NOTE — Progress Notes (Signed)
ANTICOAGULATION CONSULT NOTE - Follow Up Consult  Pharmacy Consult for heparin Indication:  NSTEMI  Labs: Recent Labs    10/15/20 0750 10/15/20 0927 10/15/20 0936 10/16/20 0953 10/16/20 1143 10/16/20 1228 10/16/20 2004 10/17/20 0129 10/17/20 1920 10/17/20 2246 10/18/20 0517  HGB  --   --    < >  --   --    < >  --  9.2*  --  10.1* 9.1*  HCT  --   --    < >  --   --    < >  --  30.3*  --  34.3* 30.4*  PLT  --   --    < >  --   --    < >  --  179  --  223 189  LABPROT  --  15.3*  --   --   --   --   --   --   --   --   --   INR  --  1.2  --   --   --   --   --   --   --   --   --   HEPARINUNFRC  --   --    < > <0.10*  --   --   --   --  0.27*  --  0.42  CREATININE  --   --    < >  --   --    < > 2.26* 2.64*  --  3.33*  --   TROPONINIHS 902*  --   --  3,086* 5,784*  --   --   --   --   --   --    < > = values in this interval not displayed.    Assessment/Plan:  65yo male therapeutic on heparin after rate change. Will continue infusion at current rate of 1600 units/hr and confirm stable with additional level.   Vernard Gambles, PharmD, BCPS  10/18/2020,5:47 AM

## 2020-10-18 NOTE — Progress Notes (Signed)
Patient brought to 4E from 2H. Telemetry box [id number] applied, CCMD notified. Patient oriented to room and staff. Call bell in reach.  Kenard Gower, RN

## 2020-10-19 DIAGNOSIS — I214 Non-ST elevation (NSTEMI) myocardial infarction: Secondary | ICD-10-CM | POA: Diagnosis not present

## 2020-10-19 DIAGNOSIS — F4321 Adjustment disorder with depressed mood: Secondary | ICD-10-CM | POA: Diagnosis not present

## 2020-10-19 DIAGNOSIS — I469 Cardiac arrest, cause unspecified: Secondary | ICD-10-CM | POA: Diagnosis not present

## 2020-10-19 DIAGNOSIS — N179 Acute kidney failure, unspecified: Secondary | ICD-10-CM | POA: Diagnosis not present

## 2020-10-19 LAB — BASIC METABOLIC PANEL
Anion gap: 12 (ref 5–15)
BUN: 38 mg/dL — ABNORMAL HIGH (ref 8–23)
CO2: 22 mmol/L (ref 22–32)
Calcium: 8.3 mg/dL — ABNORMAL LOW (ref 8.9–10.3)
Chloride: 102 mmol/L (ref 98–111)
Creatinine, Ser: 3.72 mg/dL — ABNORMAL HIGH (ref 0.61–1.24)
GFR, Estimated: 17 mL/min — ABNORMAL LOW (ref >60)
Glucose, Bld: 118 mg/dL — ABNORMAL HIGH (ref 70–99)
Potassium: 3.1 mmol/L — ABNORMAL LOW (ref 3.5–5.1)
Sodium: 136 mmol/L (ref 135–145)

## 2020-10-19 LAB — GLUCOSE, CAPILLARY
Glucose-Capillary: 122 mg/dL — ABNORMAL HIGH (ref 70–99)
Glucose-Capillary: 128 mg/dL — ABNORMAL HIGH (ref 70–99)
Glucose-Capillary: 121 mg/dL — ABNORMAL HIGH (ref 70–99)
Glucose-Capillary: 102 mg/dL — ABNORMAL HIGH (ref 70–99)

## 2020-10-19 LAB — CBC
HCT: 32.2 % — ABNORMAL LOW (ref 39.0–52.0)
Hemoglobin: 9.3 g/dL — ABNORMAL LOW (ref 13.0–17.0)
MCH: 23.1 pg — ABNORMAL LOW (ref 26.0–34.0)
MCHC: 28.9 g/dL — ABNORMAL LOW (ref 30.0–36.0)
MCV: 79.9 fL — ABNORMAL LOW (ref 80.0–100.0)
Platelets: 228 10*3/uL (ref 150–400)
RBC: 4.03 MIL/uL — ABNORMAL LOW (ref 4.22–5.81)
RDW: 18.6 % — ABNORMAL HIGH (ref 11.5–15.5)
WBC: 8.5 10*3/uL (ref 4.0–10.5)
nRBC: 0 % (ref 0.0–0.2)

## 2020-10-19 LAB — HEPARIN LEVEL (UNFRACTIONATED): Heparin Unfractionated: 0.45 IU/mL (ref 0.30–0.70)

## 2020-10-19 LAB — MAGNESIUM: Magnesium: 2.1 mg/dL (ref 1.7–2.4)

## 2020-10-19 MED ORDER — PANTOPRAZOLE SODIUM 40 MG PO TBEC
40.0000 mg | DELAYED_RELEASE_TABLET | Freq: Every day | ORAL | Status: DC
  Administered 2020-10-20 – 2020-11-02 (×14): 40 mg via ORAL
  Filled 2020-10-19 (×14): qty 1

## 2020-10-19 MED ORDER — POTASSIUM CHLORIDE CRYS ER 20 MEQ PO TBCR
40.0000 meq | EXTENDED_RELEASE_TABLET | Freq: Once | ORAL | Status: AC
  Administered 2020-10-19: 40 meq via ORAL
  Filled 2020-10-19: qty 2

## 2020-10-19 MED ORDER — AMIODARONE HCL 200 MG PO TABS
400.0000 mg | ORAL_TABLET | Freq: Every day | ORAL | Status: DC
  Administered 2020-10-19 – 2020-10-23 (×5): 400 mg via ORAL
  Filled 2020-10-19 (×5): qty 2

## 2020-10-19 NOTE — Evaluation (Signed)
Physical Therapy Evaluation Patient Details Name: Darrell [name] MRN: [id number] DOB: [birth year] Today's Date: 10/19/2020  History of Present Illness  Darrell [name] is a 65 y.o. male who presents with complaints of chest pain. On 9/19, pt experienced cardiac arrest and intubated. Extubated on 9/20. Cardiology recommends heart catheterization in future after resolution of AKI. PMH: CHF, CABG (2009), NSTEMI (8/22), bedbug infestation, CKD, CAD, DM, HTN   Clinical Impression  Pt in bed upon arrival of PT, agreeable to evaluation at this time. Prior to admission the pt was mobilizing in his home with use of a RW, reports he is independent with AD for mobility, receives assist from wife for ADLs and IADLs. The pt now presents with limitations in functional mobility, power, endurance, and strength due to above dx, and will continue to benefit from skilled PT to address these deficits. The pt required modA to power up from EOB and to maintain static standing with RW due to posterior lean with minimal self-correction or initiation of postural changes. The pt was then able to take steps to a recliner, but limited in further distance by reports of fatigue, especially in BLE. Will continue to benefit from skilled PT to progress OOB mobility, ambulation, and initiate stair training to allow for d/c home.         Recommendations for follow up therapy are one component of a multi-disciplinary discharge planning process, led by the attending physician.  Recommendations may be updated based on patient status, additional functional criteria and insurance authorization.  Follow Up Recommendations Home health PT;Supervision for mobility/OOB    Equipment Recommendations  3in1 (PT)    Recommendations for Other Services       Precautions / Restrictions Precautions Precautions: Fall;Other (comment) Precaution Comments: incontinence Restrictions Weight Bearing Restrictions: No      Mobility  Bed  Mobility Overal bed mobility: Needs Assistance Bed Mobility: Supine to Sit     Supine to sit: Min assist;HOB elevated     General bed mobility comments: Min A to lift trunk and scoot hips. HOB elevated and use of bed rails. increased time    Transfers Overall transfer level: Needs assistance Equipment used: Rolling walker (2 wheeled) Transfers: Sit to/from [employer] Sit to Stand: Mod assist;+2 safety/equipment;+2 physical assistance Stand pivot transfers: Mod assist;+2 safety/equipment;+2 physical assistance       General transfer comment: Mod X 2 for sit to stand from bed with RW. Improving with second trial though despite max cues for hand placement, pt would not push from bed or reach back for recliner armrests. Pt able to take steps to chair though noted difficulty lifting LEs and possible knee buckling (L > R)  Ambulation/Gait Ambulation/Gait assistance: Mod assist Gait Distance (Feet): 4 Feet Assistive device: Rolling walker (2 wheeled) Gait Pattern/deviations: Step-to pattern Gait velocity: decreased Gait velocity interpretation: <1.31 ft/sec, indicative of household ambulator General Gait Details: pt with small lateral steps with modA to maintain balance and manage RW movements      Balance Overall balance assessment: Needs assistance Sitting-balance support: No upper extremity supported;Feet supported Sitting balance-Leahy Scale: Fair     Standing balance support: Bilateral upper extremity supported;During functional activity Standing balance-Leahy Scale: Poor Standing balance comment: reliant on UE support + external support                             Pertinent Vitals/Pain Pain Assessment: No/denies pain    Home Living Family/patient  expects to be discharged to:: Private residence Living Arrangements: Spouse/significant other Available Help at Discharge: Family;Available 24 hours/day Type of Home: Apartment Home Access:  Stairs to enter Entrance Stairs-Rails: Right Entrance Stairs-Number of Steps: flight Home Layout: One level Home Equipment: Walker - 2 wheels Additional Comments: Wife also uses walker    Prior Function Level of Independence: Needs assistance   Gait / Transfers Assistance Needed: use of RW for mobility around apartment  ADL's / Homemaking Assistance Needed: sponge bathes with setup from wife, walks to/from bathroom and able to toilet self. Able to dress self. Son assists with groceries, wife completes meals        Hand Dominance   Dominant Hand: Right    Extremity/Trunk Assessment   Upper Extremity Assessment Upper Extremity Assessment: Defer to OT evaluation    Lower Extremity Assessment Lower Extremity Assessment: Generalized weakness (pt grossly 4/5 to MMT, but with limited power and muscular endurance.)    Cervical / Trunk Assessment Cervical / Trunk Assessment: Kyphotic  Communication   Communication: HOH  Cognition Arousal/Alertness: Awake/alert Behavior During Therapy: Flat affect Overall Cognitive Status: No family/caregiver present to determine baseline cognitive functioning                                 General Comments: Follows directions with increased time (except hand placement for transfers), flat affect but willing to attempt all tasks. slower processing      General Comments General comments (skin integrity, edema, etc.): SpO2 99% on RA, VSS    Exercises     Assessment/Plan    PT Assessment Patient needs continued PT services  PT Problem List Decreased range of motion;Decreased activity tolerance;Decreased strength;Decreased balance;Decreased coordination;Decreased mobility;Decreased cognition;Decreased safety awareness       PT Treatment Interventions DME instruction;Gait training;Stair training;Functional mobility training;Therapeutic activities;Balance training;Therapeutic exercise;Patient/family education    PT Goals  (Current goals can be found in the Care Plan section)  Acute Rehab PT Goals Patient Stated Goal: go home PT Goal Formulation: With patient Time For Goal Achievement: 11/02/20 Potential to Achieve Goals: Fair    Frequency Min 3X/week   Barriers to discharge Inaccessible home environment pt with flight of stairs to enter    Co-evaluation PT/OT/SLP Co-Evaluation/Treatment: Yes Reason for Co-Treatment: For patient/therapist safety;To address functional/ADL transfers PT goals addressed during session: Mobility/safety with mobility;Balance;Proper use of DME OT goals addressed during session: ADL's and self-care       AM-PAC PT "6 Clicks" Mobility  Outcome Measure Help needed turning from your back to your side while in a flat bed without using bedrails?: A Little Help needed moving from lying on your back to sitting on the side of a flat bed without using bedrails?: A Little Help needed moving to and from a bed to a chair (including a wheelchair)?: A Little Help needed standing up from a chair using your arms (e.g., wheelchair or bedside chair)?: A Lot Help needed to walk in hospital room?: A Lot Help needed climbing 3-5 steps with a railing? : A Lot 6 Click Score: 15    End of Session Equipment Utilized During Treatment: Gait belt Activity Tolerance: Patient tolerated treatment well Patient left: in chair;with call bell/phone within reach;with chair alarm set Nurse Communication: Mobility status PT Visit Diagnosis: Other abnormalities of gait and mobility (R26.89);Unsteadiness on feet (R26.81);Muscle weakness (generalized) (M62.81)    Time: 0277-4128 PT Time Calculation (min) (ACUTE ONLY): 21 min   Charges:  PT Evaluation $PT Eval Moderate Complexity: 1 Mod          Vickki Muff, PT, DPT   Acute Rehabilitation Department Pager #: [phone]  Ronnie Derby 10/19/2020, 10:36 AM

## 2020-10-19 NOTE — Evaluation (Signed)
Occupational Therapy Evaluation Patient Details Name: Darrell [name] MRN: [id number] DOB: 1955/10/20 Today's Date: 10/19/2020   History of Present Illness Darrell [name] is a 65 y.o. male who presents with complaints of chest pain. On 9/19, pt experienced cardiac arrest and intubated. Extubated on 9/20. Cardiology recommends heart catheterization in future after resolution of AKI. PMH: CHF, CABG (2009), NSTEMI (8/22), bedbug infestation, CKD, CAD, DM, HTN   Clinical Impression   PTA, pt lives with spouse in second floor apartment with multiple steps to enter. Pt reports use of RW for mobility, Modified Independent with dressing and toileting tasks, and Setup assist for sponge bathing tasks in bed. Pt presents now below baseline with deficits noted below. Pt overall Mod A x 2 for first transfer to chair since admission using RW. Pt requires Min A for UB ADLs and up to Total A for LB ADLs (including peri care after bowel incontinence during session today). Pt noted to fatigue quickly in standing and required a seated rest break while assisted with care. Recommend SNF rehab at DC to maximize independence and decrease fall risk at home.   SpO2 99% on RA, HR WFL     Recommendations for follow up therapy are one component of a multi-disciplinary discharge planning process, led by the attending physician.  Recommendations may be updated based on patient status, additional functional criteria and insurance authorization.   Follow Up Recommendations  SNF    Equipment Recommendations  3 in 1 bedside commode;Tub/shower bench (pending progress)    Recommendations for Other Services       Precautions / Restrictions Precautions Precautions: Fall;Other (comment) Precaution Comments: incontinence Restrictions Weight Bearing Restrictions: No      Mobility Bed Mobility Overal bed mobility: Needs Assistance Bed Mobility: Supine to Sit     Supine to sit: Min assist;HOB elevated      General bed mobility comments: Min A to lift trunk and scoot hips. HOB elevated and use of bed rails. increased time    Transfers Overall transfer level: Needs assistance Equipment used: Rolling walker (2 wheeled) Transfers: Sit to/from [employer] Sit to Stand: Mod assist;+2 safety/equipment;+2 physical assistance Stand pivot transfers: Mod assist;+2 safety/equipment;+2 physical assistance       General transfer comment: Mod X 2 for sit to stand from bed with RW. Improving with second trial though despite max cues for hand placement, pt would not push from bed or reach back for recliner armrests. Pt able to take steps to chair though noted difficulty lifting LEs and possible knee buckling (L > R)    Balance Overall balance assessment: Needs assistance Sitting-balance support: No upper extremity supported;Feet supported Sitting balance-Leahy Scale: Fair     Standing balance support: Bilateral upper extremity supported;During functional activity Standing balance-Leahy Scale: Poor Standing balance comment: reliant on UE support + external support                           ADL either performed or assessed with clinical judgement   ADL Overall ADL's : Needs assistance/impaired Eating/Feeding: Set up;Sitting   Grooming: Set up;Sitting;Wash/dry face Grooming Details (indicate cue type and reason): sitting EOB Upper Body Bathing: Minimal assistance;Sitting   Lower Body Bathing: Maximal assistance;Sitting/lateral leans;Sit to/from stand   Upper Body Dressing : Minimal assistance;Sitting   Lower Body Dressing: Total assistance;Sit to/from stand   Toilet Transfer: Moderate assistance;+2 for safety/equipment;Stand-pivot;RW Toilet Transfer Details (indicate cue type and reason): simulated to recliner Toileting- Clothing  Manipulation and Hygiene: Total assistance;Sit to/from stand Toileting - Clothing Manipulation Details (indicate cue type and reason):  Total A for peri care due to bowel incontinence       General ADL Comments: Pt noted with deficits in standing balance and endurance increasing fall risk during ADLs     Vision Ability to See in Adequate Light: 0 Adequate Patient Visual Report: No change from baseline Vision Assessment?: No apparent visual deficits     Perception     Praxis      Pertinent Vitals/Pain Pain Assessment: No/denies pain     Hand Dominance Right   Extremity/Trunk Assessment Upper Extremity Assessment Upper Extremity Assessment: Defer to OT evaluation   Lower Extremity Assessment Lower Extremity Assessment: Generalized weakness (pt grossly 4/5 to MMT, but with limited power and muscular endurance.)   Cervical / Trunk Assessment Cervical / Trunk Assessment: Kyphotic   Communication Communication Communication: HOH   Cognition Arousal/Alertness: Awake/alert Behavior During Therapy: Flat affect Overall Cognitive Status: No family/caregiver present to determine baseline cognitive functioning                                 General Comments: Follows directions with increased time (except hand placement for transfers), flat affect but willing to attempt all tasks. slower processing   General Comments  SpO2 99% on RA. HR WFL    Exercises     Shoulder Instructions      Home Living Family/patient expects to be discharged to:: Private residence Living Arrangements: Spouse/significant other Available Help at Discharge: Family;Available 24 hours/day Type of Home: Apartment Home Access: Stairs to enter [employer] of Steps: flight Entrance Stairs-Rails: Right Home Layout: One level     Bathroom Shower/Tub: Chief Strategy Officer: Standard Bathroom Accessibility: Yes   Home Equipment: Environmental consultant - 2 wheels   Additional Comments: Wife also uses walker      Prior Functioning/Environment Level of Independence: Needs assistance  Gait / Transfers  Assistance Needed: use of RW for mobility around apartment ADL's / Homemaking Assistance Needed: sponge bathes with setup from wife, walks to/from bathroom and able to toilet self. Able to dress self. Son assists with groceries, wife completes meals            OT Problem List: Decreased strength;Decreased activity tolerance;Impaired balance (sitting and/or standing);Decreased cognition;Decreased safety awareness      OT Treatment/Interventions: Self-care/ADL training;Therapeutic exercise;DME and/or AE instruction;Energy conservation;Therapeutic activities;Patient/family education;Balance training    OT Goals(Current goals can be found in the care plan section) Acute Rehab OT Goals Patient Stated Goal: go home OT Goal Formulation: With patient Time For Goal Achievement: 11/02/20 Potential to Achieve Goals: Good  OT Frequency: Min 2X/week   Barriers to D/C:            Co-evaluation PT/OT/SLP Co-Evaluation/Treatment: Yes Reason for Co-Treatment: For patient/therapist safety;To address functional/ADL transfers PT goals addressed during session: Mobility/safety with mobility;Balance;Proper use of DME OT goals addressed during session: ADL's and self-care      AM-PAC OT "6 Clicks" Daily Activity     Outcome Measure Help from another person eating meals?: A Little Help from another person taking care of personal grooming?: A Little Help from another person toileting, which includes using toliet, bedpan, or urinal?: Total Help from another person bathing (including washing, rinsing, drying)?: A Lot Help from another person to put on and taking off regular upper body clothing?: A Little Help from another  person to put on and taking off regular lower body clothing?: A Lot 6 Click Score: 14   End of Session Equipment Utilized During Treatment: Engineer, water Communication: Mobility status  Activity Tolerance: Patient tolerated treatment well;Patient limited by fatigue Patient  left: in chair;with call bell/phone within reach;with chair alarm set  OT Visit Diagnosis: Unsteadiness on feet (R26.81);Other abnormalities of gait and mobility (R26.89);Muscle weakness (generalized) (M62.81)                Time: 2683-4196 OT Time Calculation (min): 23 min Charges:  OT General Charges $OT Visit: 1 Visit OT Evaluation $OT Eval Moderate Complexity: 1 Mod  Bradd Canary, OTR/L Acute Rehab Services Office: [phone]   Lorre Munroe 10/19/2020, 10:31 AM

## 2020-10-19 NOTE — Progress Notes (Signed)
DAILY PROGRESS NOTE   Patient Name: Darrell [name] Date of Encounter: 10/19/2020 Cardiologist: Minus Breeding, MD  Chief Complaint   Awake, NAD, flat affect  Patient Profile   Darrell [name] is a 65 y.o. male with a hx of CAD status post CABG and PCI's, hypertension, hyperlipidemia, diabetes, CKD stage III, chronic diastolic heart failure, bedbug infestation, medical noncompliance who is being seen 10/15/2020 for the evaluation of chest pain at the request of Dr Roslynn Amble.  Subjective   No chest pain complaints. Creatinine has increased further yesterday - was given 120 mg IV lasix, did diurese about 1.2L. Potassium low today at 3.1. H/H has been stable. No significant arrhythmias.  Objective   Vitals:   10/18/20 2010 10/18/20 2330 10/19/20 0445 10/19/20 0831  BP: 130/69 (!) 157/81 (!) 148/72 (!) 148/70  Pulse: 67 69 67 65  Resp: _0 Temp: 99 F (37.2 C) 97.6 F (36.4 C) 98.2 F (36.8 C) 98.2 F (36.8 C)  TempSrc: Oral Oral Oral Oral  SpO2: 99% 100% 99% 100%  Weight:      Height:        Intake/Output Summary (Last 24 hours) at 10/19/2020 1128 Last data filed at 10/19/2020 0500 Gross per 24 hour  Intake 591.47 ml  Output 1785 ml  Net -1193.53 ml   Filed Weights   10/15/20 0456 10/15/20 1700  Weight: 106.6 kg 98.2 kg    Physical Exam   General appearance: alert, no distress, and slowed mentation Neck: JVD - 2 cm above sternal notch, no carotid bruit, and thyroid not enlarged, symmetric, no tenderness/mass/nodules Lungs: diminished breath sounds bibasilar Heart: regular rate and rhythm Abdomen: soft, non-tender; bowel sounds normal; no masses,  no organomegaly Extremities: extremities normal, atraumatic, no cyanosis or edema Pulses: 2+ and symmetric Skin: pale, warm, dry Neurologic: Mental status: awake, slowed mentation Psych: Flat  Inpatient Medications    Scheduled Meds:  aspirin EC  81 mg Oral Daily   atorvastatin  80 mg Oral QPM    chlorhexidine gluconate (MEDLINE KIT)  15 mL Mouth Rinse BID   Chlorhexidine Gluconate Cloth  6 each Topical Daily   insulin aspart  0-15 Units Subcutaneous TID WC   mouth rinse  15 mL Mouth Rinse 10 times per day   pantoprazole (PROTONIX) IV  40 mg Intravenous Q24H   potassium chloride  40 mEq Oral Once   sodium chloride flush  10-40 mL Intracatheter Q12H    Continuous Infusions:  amiodarone 30 mg/hr (10/19/20 0302)   heparin 1,600 Units/hr (10/18/20 1400)    PRN Meds: acetaminophen, albuterol, alum & mag hydroxide-simeth, nitroGLYCERIN, ondansetron (ZOFRAN) IV, sodium chloride flush   Labs   Results for orders placed or performed during the hospital encounter of 10/15/20 (from the past 48 hour(s))  Glucose, capillary     Status: Abnormal   Collection Time: 10/17/20 11:44 AM  Result Value Ref Range   Glucose-Capillary 129 (H) 70 - 99 mg/dL    Comment: Glucose reference range applies only to samples taken after fasting for at least 8 hours.  Glucose, capillary     Status: Abnormal   Collection Time: 10/17/20  4:03 PM  Result Value Ref Range   Glucose-Capillary 145 (H) 70 - 99 mg/dL    Comment: Glucose reference range applies only to samples taken after fasting for at least 8 hours.  Heparin level (unfractionated)     Status: Abnormal   Collection Time: 10/17/20  7:20 PM  Result Value  Ref Range   Heparin Unfractionated 0.27 (L) 0.30 - 0.70 IU/mL    Comment: (NOTE) The clinical reportable range upper limit is being lowered to >1.10 to align with the FDA approved guidance for the current laboratory assay.  If heparin results are below expected values, and patient dosage has  been confirmed, suggest follow up testing of antithrombin III levels. Performed at [facility], [place] [address], [place], [place] [address]   Glucose, capillary     Status: Abnormal   Collection Time: 10/17/20  8:39 PM  Result Value Ref Range   Glucose-Capillary 126 (H) 70 - 99 mg/dL    Comment:  Glucose reference range applies only to samples taken after fasting for at least 8 hours.  Basic metabolic panel     Status: Abnormal   Collection Time: 10/17/20 10:46 PM  Result Value Ref Range   Sodium 138 135 - 145 mmol/L   Potassium 4.1 3.5 - 5.1 mmol/L   Chloride 106 98 - 111 mmol/L   CO2 23 22 - 32 mmol/L   Glucose, Bld 99 70 - 99 mg/dL    Comment: Glucose reference range applies only to samples taken after fasting for at least 8 hours.   BUN 33 (H) 8 - 23 mg/dL   Creatinine, Ser 3.33 (H) 0.61 - 1.24 mg/dL    Comment: DELTA CHECK NOTED   Calcium 8.3 (L) 8.9 - 10.3 mg/dL   GFR, Estimated 20 (L) >60 mL/min    Comment: (NOTE) Calculated using the CKD-EPI Creatinine Equation (2021)    Anion gap 9 5 - 15    Comment: Performed at [facility] [address], [place], [place] [address]  Magnesium     Status: Abnormal   Collection Time: 10/17/20 10:46 PM  Result Value Ref Range   Magnesium 2.5 (H) 1.7 - 2.4 mg/dL    Comment: Performed at [facility] [address] [place], [place] [address]  CBC     Status: Abnormal   Collection Time: 10/17/20 10:46 PM  Result Value Ref Range   WBC 12.0 (H) 4.0 - 10.5 K/uL   RBC 4.34 4.22 - 5.81 MIL/uL   Hemoglobin 10.1 (L) 13.0 - 17.0 g/dL   HCT 34.3 (L) 39.0 - 52.0 %   MCV 79.0 (L) 80.0 - 100.0 fL   MCH 23.3 (L) 26.0 - 34.0 pg   MCHC 29.4 (L) 30.0 - 36.0 g/dL   RDW 18.1 (H) 11.5 - 15.5 %   Platelets 223 150 - 400 K/uL   nRBC 0.0 0.0 - 0.2 %    Comment: Performed at [facility], [place] [address] [place], [place] [address]  Glucose, capillary     Status: Abnormal   Collection Time: 10/17/20 11:57 PM  Result Value Ref Range   Glucose-Capillary 114 (H) 70 - 99 mg/dL    Comment: Glucose reference range applies only to samples taken after fasting for at least 8 hours.  Glucose, capillary     Status: Abnormal   Collection Time: 10/18/20  4:13 AM  Result Value Ref Range   Glucose-Capillary 126 (H) 70 - 99 mg/dL     Comment: Glucose reference range applies only to samples taken after fasting for at least 8 hours.  CBC     Status: Abnormal   Collection Time: 10/18/20  5:17 AM  Result Value Ref Range   WBC 8.8 4.0 - 10.5 K/uL   RBC 3.90 (L) 4.22 - 5.81 MIL/uL  Hemoglobin 9.1 (L) 13.0 - 17.0 g/dL   HCT 30.4 (L) 39.0 - 52.0 %   MCV 77.9 (L) 80.0 - 100.0 fL   MCH 23.3 (L) 26.0 - 34.0 pg   MCHC 29.9 (L) 30.0 - 36.0 g/dL   RDW 18.3 (H) 11.5 - 15.5 %   Platelets 189 150 - 400 K/uL   nRBC 0.0 0.0 - 0.2 %    Comment: Performed at [facility] [address], [place], [place] [address]  Basic metabolic panel     Status: Abnormal   Collection Time: 10/18/20  5:17 AM  Result Value Ref Range   Sodium 136 135 - 145 mmol/L   Potassium 3.5 3.5 - 5.1 mmol/L   Chloride 103 98 - 111 mmol/L   CO2 23 22 - 32 mmol/L   Glucose, Bld 102 (H) 70 - 99 mg/dL    Comment: Glucose reference range applies only to samples taken after fasting for at least 8 hours.   BUN 34 (H) 8 - 23 mg/dL   Creatinine, Ser 3.47 (H) 0.61 - 1.24 mg/dL   Calcium 8.2 (L) 8.9 - 10.3 mg/dL   GFR, Estimated 19 (L) >60 mL/min    Comment: (NOTE) Calculated using the CKD-EPI Creatinine Equation (2021)    Anion gap 10 5 - 15    Comment: Performed at [facility] [address], [place], [place] [address]  Heparin level (unfractionated)     Status: None   Collection Time: 10/18/20  5:17 AM  Result Value Ref Range   Heparin Unfractionated 0.42 0.30 - 0.70 IU/mL    Comment: (NOTE) The clinical reportable range upper limit is being lowered to >1.10 to align with the FDA approved guidance for the current laboratory assay.  If heparin results are below expected values, and patient dosage has  been confirmed, suggest follow up testing of antithrombin III levels. Performed at [facility], [place] [address], [place], [place] [address]   Glucose, capillary     Status: Abnormal   Collection Time: 10/18/20  8:26 AM  Result Value Ref  Range   Glucose-Capillary 123 (H) 70 - 99 mg/dL    Comment: Glucose reference range applies only to samples taken after fasting for at least 8 hours.  Glucose, capillary     Status: Abnormal   Collection Time: 10/18/20 11:48 AM  Result Value Ref Range   Glucose-Capillary 129 (H) 70 - 99 mg/dL    Comment: Glucose reference range applies only to samples taken after fasting for at least 8 hours.  Glucose, capillary     Status: Abnormal   Collection Time: 10/18/20  4:12 PM  Result Value Ref Range   Glucose-Capillary 122 (H) 70 - 99 mg/dL    Comment: Glucose reference range applies only to samples taken after fasting for at least 8 hours.  Glucose, capillary     Status: Abnormal   Collection Time: 10/18/20  9:15 PM  Result Value Ref Range   Glucose-Capillary 123 (H) 70 - 99 mg/dL    Comment: Glucose reference range applies only to samples taken after fasting for at least 8 hours.   Comment 1 Notify RN    Comment 2 Document in Chart   Heparin level (unfractionated)     Status: None   Collection Time: 10/19/20  5:30 AM  Result Value Ref Range   Heparin Unfractionated 0.45 0.30 - 0.70 IU/mL    Comment: (NOTE) The clinical reportable range upper limit is being  lowered to >1.10 to align with the FDA approved guidance for the current laboratory assay.  If heparin results are below expected values, and patient dosage has  been confirmed, suggest follow up testing of antithrombin III levels. Performed at [facility], [place] [address] [place], [place] [address]   CBC     Status: Abnormal   Collection Time: 10/19/20  5:30 AM  Result Value Ref Range   WBC 8.5 4.0 - 10.5 K/uL   RBC 4.03 (L) 4.22 - 5.81 MIL/uL   Hemoglobin 9.3 (L) 13.0 - 17.0 g/dL   HCT 32.2 (L) 39.0 - 52.0 %   MCV 79.9 (L) 80.0 - 100.0 fL   MCH 23.1 (L) 26.0 - 34.0 pg   MCHC 28.9 (L) 30.0 - 36.0 g/dL   RDW 18.6 (H) 11.5 - 15.5 %   Platelets 228 150 - 400 K/uL   nRBC 0.0 0.0 - 0.2 %    Comment: Performed at [facility] [address], [place], [place] [address]  Basic metabolic panel     Status: Abnormal   Collection Time: 10/19/20  5:30 AM  Result Value Ref Range   Sodium 136 135 - 145 mmol/L   Potassium 3.1 (L) 3.5 - 5.1 mmol/L   Chloride 102 98 - 111 mmol/L   CO2 22 22 - 32 mmol/L   Glucose, Bld 118 (H) 70 - 99 mg/dL    Comment: Glucose reference range applies only to samples taken after fasting for at least 8 hours.   BUN 38 (H) 8 - 23 mg/dL   Creatinine, Ser 3.72 (H) 0.61 - 1.24 mg/dL   Calcium 8.3 (L) 8.9 - 10.3 mg/dL   GFR, Estimated 17 (L) >60 mL/min    Comment: (NOTE) Calculated using the CKD-EPI Creatinine Equation (2021)    Anion gap 12 5 - 15    Comment: Performed at [facility] [address] [place], [place] [address]  Magnesium     Status: None   Collection Time: 10/19/20  5:30 AM  Result Value Ref Range   Magnesium 2.1 1.7 - 2.4 mg/dL    Comment: Performed at [facility] [address] [place], [place] [address]  Glucose, capillary     Status: Abnormal   Collection Time: 10/19/20  6:19 AM  Result Value Ref Range   Glucose-Capillary 122 (H) 70 - 99 mg/dL    Comment: Glucose reference range applies only to samples taken after fasting for at least 8 hours.   Comment 1 Notify RN    Comment 2 Document in Chart     ECG   N/A  Telemetry   Sinus rhythm with 8 beat run of NSVT - Personally Reviewed  Radiology    US RENAL  Result Date: 10/18/2020 CLINICAL DATA:  Acute kidney injury EXAM: RENAL / URINARY TRACT ULTRASOUND COMPLETE COMPARISON:  Renal ultrasound 08/06/2019 FINDINGS: Right Kidney: Renal measurements: 13.4 x 7.1 x 5.2 cm = volume: 260 mL. Cystic changes at the right renal hilum, difficult to delineate parapelvic cysts from hydronephrosis, although hydronephrosis is favored and was demonstrated on prior exam. There is thinning of the renal parenchyma. Shadowing stone in the upper kidney measures 2 cm. Left Kidney: Renal measurements: 11.1  x 5.7 x 6.0 cm = volume: 96 mL. Areas of cortical thinning and atrophy, better delineated on prior exam. No hydronephrosis. 2.7 cm cyst in the upper kidney. No visualized stone or solid lesion. Bladder: Decompressed by Foley catheter.  Other: None. IMPRESSION: 1. Cystic changes of the right renal hila favored to represent hydronephrosis, not significantly changed from prior renal ultrasound. There is associated cortical thinning suggesting this is a chronic process. 2. Probable 2 cm stone in the upper right kidney. 3. No left hydronephrosis.  Areas of left cortical thinning. Electronically Signed   By: Keith Rake M.D.   On: 10/18/2020 20:04    Cardiac Studies   none  Assessment   Principal Problem:   NSTEMI (non-ST elevated myocardial infarction) [facility]) Active Problems:   Type 2 diabetes mellitus without complication, without long-term current use of insulin (HCC)   Adjustment disorder with depressed mood   Coronary atherosclerosis, s/p CABG in 2009    History of noncompliance with medical treatment, presenting hazards to health   AKI (acute kidney injury) ([facility])   Diabetes mellitus type 2 in nonobese ([facility])   Anemia   Chest pain   VT (ventricular tachycardia) ([facility])   Cardiac arrest ([facility])   Plan   No complaints - taking po, but not much. Will d/c IV amiodarone and switch to po - for VT. Still needs cath, however, creatinine will not support contrast. Continue IV heparin, hopefully for cath Monday. Suspect creatinine will peak and trend down tomorrow. Will not give additional lasix today. If it continues to rise, would consider nephrology consult, but again, he did make a good amount of urine with lasix yesterday. Will change protonix from IV to po daily - no S/S of GIB at this point.  Time Spent Directly with Patient:  I have spent a total of 35 minutes with the patient reviewing hospital notes, telemetry, EKGs, labs and examining the patient as well as establishing an assessment and  plan that was discussed personally with the patient.  > 50% of time was spent in direct patient care.  Length of Stay:  LOS: 4 days   Pixie Casino, MD, [facility], [place] Director of the [facility]  [facility] Diplomate of the American Board of Clinical Lipidology Attending Cardiologist  Direct Dial: [phone]  Fax: [phone]  Website:  [email or web address] 10/19/2020, 11:28 AM

## 2020-10-19 NOTE — Progress Notes (Signed)
ANTICOAGULATION CONSULT NOTE - Follow-Up Consult  Pharmacy Consult for heparin Indication: chest pain/ACS  No Known Allergies  Patient Measurements: Height: 6\' 1"  (185.4 cm) Weight: 98.2 kg (216 lb 7.9 oz) IBW/kg (Calculated) : 79.9 Heparin Dosing Weight: 98 kg   Vital Signs: Temp: 98.2 F (36.8 C) (09/22 0445) Temp Source: Oral (09/22 0445) BP: 148/72 (09/22 0445) Pulse Rate: 67 (09/22 0445)  Labs: Recent Labs    10/16/20 0953 10/16/20 1143 10/16/20 1228 10/17/20 1920 10/17/20 2246 10/18/20 0517 10/19/20 0530  HGB  --   --    < >  --  10.1* 9.1* 9.3*  HCT  --   --    < >  --  34.3* 30.4* 32.2*  PLT  --   --    < >  --  223 189 228  HEPARINUNFRC <0.10*  --   --  0.27*  --  0.42 0.45  CREATININE  --   --    < >  --  3.33* 3.47* 3.72*  TROPONINIHS 6,892* 6,596*  --   --   --   --   --    < > = values in this interval not displayed.     Estimated Creatinine Clearance: 24.7 mL/min (A) (by C-G formula based on SCr of 3.72 mg/dL (H)).    Assessment: 89 yoM with recent NSTEMI but declined cardiac cath presents today with central chest pain. Troponins elevated. Pt was on heparin for planned 48h due to refusal of cath, now s/p VF arrest, heparin to resume pending possible change in cath plans. Hgb improved this morning - will lower goal rate and defer boluses.  Heparin level of 0.45 is at goal on IV heparin at 1600 units/hr.  No overt bleeding or complications noted, CBC fairly stable.  Awaiting cath, but Scr continues to rise.  Goal of Therapy:  Heparin level 0.3-0.5 units/ml Monitor platelets by anticoagulation protocol: Yes   Plan:  Continue IV heparin at current rate. F/u plans for cath. Daily heparin level and CBC.  77, Reece Leader, BCCP Clinical Pharmacist  10/19/2020 8:05 AM   [facility] pharmacy phone numbers are listed on [email or web address]

## 2020-10-19 NOTE — Discharge Summary (Signed)
Name: Darrell [name] MRN: [id number] DOB: 1955/02/29 65 y.o. PCP: Hoy Register, MD  Date of Admission: 10/15/2020  4:43 AM Date of Discharge: 11/02/2020  5:58 PM Attending Physician: Dr. Charissa Bash   Discharge Diagnosis: NSTEMI V. Tach cardiac arrest  Acute Tubular Necrosis with NAGMA on CKD Stage 3b Urinary Tract Infection 2/2 E. Coli  Iron Deficiency Anemia Type 2 Diabetes Mellitus  Acute Hypoxic Respiratory Failure  Hypertension  Discharge Medications: Allergies as of 11/02/2020   No Known Allergies      Medication List     STOP taking these medications    clopidogrel 75 MG tablet Commonly known as: PLAVIX   FeroSul 325 (65 FE) MG tablet Generic drug: ferrous sulfate   hydrALAZINE 50 MG tablet Commonly known as: APRESOLINE   isosorbide mononitrate 60 MG 24 hr tablet Commonly known as: IMDUR   tamsulosin 0.4 MG Caps capsule Commonly known as: FLOMAX       TAKE these medications    amiodarone 200 MG tablet Commonly known as: PACERONE Take 1 tablet (200 mg total) by mouth 2 (two) times daily for 13 days, THEN 1 tablet (200 mg total) daily. Start taking on: November 02, 2020   amLODipine 10 MG tablet Commonly known as: NORVASC Take 1 tablet (10 mg total) by mouth daily. Please make PCP appointment.   aspirin 81 MG EC tablet Take 1 tablet (81 mg total) by mouth daily. Swallow whole.   atorvastatin 80 MG tablet Commonly known as: LIPITOR Take 1 tablet (80 mg total) by mouth every evening.   carvedilol 6.25 MG tablet Commonly known as: COREG Take 1 tablet (6.25 mg total) by mouth 2 (two) times daily with a meal. What changed:  medication strength how much to take additional instructions   metFORMIN 500 MG tablet Commonly known as: GLUCOPHAGE Take 1 tablet (500 mg total) by mouth daily with breakfast.   nitroGLYCERIN 0.4 MG SL tablet Commonly known as: NITROSTAT Place 1 tablet (0.4 mg total) under the tongue every 5 (five) minutes as  needed for chest pain.        Disposition and follow-up:   Mr.Darrell [name] was discharged from [facility] in Stable condition.  At the hospital follow up visit please address:  1.    Please ensure patient is compliant with medications Assess mood as there was concern for depressed mood during hospitalization   2.  Labs / imaging needed at time of follow-up: CBC, BMP   3.  Pending labs/ test needing follow-up: None  Follow-up Appointments: PCP, Cardiology   Hospital Course by problem list: 1. NSTEMI Patient presented to the ED with complaints of chest pain with elevation in troponin and T wave inversions on EKG.  Patient has known obstructive CAD status post CABG.  Cardiology was consulted and left heart cath was recommended, however patient declined at the time.  After cardiac arrest with subsequent ATN, patient was amenable to cardiac cath.  Left heart cath on 10/5 demonstrated multiple occlusions, however patent SVG-OM and LIMA-LAD.  No occlusions were amenable to stents.  Medical management was recommended.  On discharge, patient was on aspirin, carvedilol, and amlodipine.  Recommend outpatient follow-up with cardiology for maximization of anti-ischemic medications.  2. Ventricular Tach. Cardiac Arrest In the setting of NSTEMI.  Initial rhythm of ventricular tachycardia with eventual conversion to PEA after defibrillation.  ROSC was achieved after 10-15 minutes with administration of epinephrine and bicarb.  For the remainder of admission, patient had  remaining ventricular ectopy requiring amiodarone drip with eventual transition to oral tablets.  EP was consulted and ICD was recommended, however patient declined  3. Acute Tubular Necrosis with NAGMA on CKD Stage 3b Following ventricular tachycardia arrest patient developed acute kidney injury in the ICU due to to acute tubular necrosis. Patient's creatinine peaked at 3.83 and began to slowly improve to 1.8 (GFR  of 41) on discharge. UOP adequate.   4. Urinary Tract Infection 2/2 E. Coli  Patient had one episode of fever of 103.1 F. Blood and urine culture drawn at that time.  Urine culture grew >100,000 of E.coli.  Started patient on Ceftriaxone for 3 days and switch to oral Keflex for remainder of 7-day course.  5. Iron Deficiency Anemia In the setting of cardiac arrest. Transfused 2 units with adequate response. Hemoglobin remained stable for the rest of admission.   6. Type 2 Diabetes Mellitus  A1c of 5.6 prior to admission. Managed with SSI during admission and discharged on home Metformin.   7. Acute Hypoxic Respiratory Failure  In the setting of cardiac arrest, which time he was intubated.  Patient was successfully extubated the next day and weaned to room air over the duration of his admission.  He was discharged with no requirement for supplemental oxygen.  8. Hypertension Hypertensive during admission. Antihypertensives on discharge include carvedilol and amlodipine.  Discharge Exam:   BP (!) 148/91 (BP Location: Right Arm)   Pulse 60   Temp 98.4 F (36.9 C) (Oral)   Resp 18   Ht 6\' 1"  (1.854 m)   Wt 99.5 kg   SpO2 98%   BMI 28.94 kg/m  Discharge exam:  Physical Exam Vitals and nursing note reviewed.  Constitutional:      General: He is not in acute distress.    Appearance: He is normal weight.  HENT:     Head: Normocephalic and atraumatic.  Eyes:     Extraocular Movements: Extraocular movements intact.     Pupils: Pupils are equal, round, and reactive to light.  Neck:     Vascular: No JVD.  Cardiovascular:     Rate and Rhythm: Normal rate and regular rhythm.     Pulses:          Radial pulses are 2+ on the right side and 2+ on the left side.  Pulmonary:     Effort: Pulmonary effort is normal. No tachypnea or respiratory distress.     Breath sounds: No decreased breath sounds, wheezing, rhonchi or rales.  Abdominal:     Palpations: Abdomen is soft.  Musculoskeletal:      Cervical back: Normal range of motion and neck supple.     Right lower leg: No tenderness. No edema.     Left lower leg: No tenderness. No edema.  Skin:    General: Skin is warm and dry.  Neurological:     General: No focal deficit present.     Mental Status: He is alert and oriented to person, place, and time.  Psychiatric:        Mood and Affect: Mood normal.        Behavior: Behavior normal.   Pertinent Labs, Studies, and Procedures:  BMP Latest Ref Rng & Units 11/02/2020 11/01/2020 10/31/2020  Glucose 70 - 99 mg/dL 12/31/2020) 89 676(P)  BUN 8 - 23 mg/dL 21 22 950(D)  Creatinine 0.61 - 1.24 mg/dL 32(I) 7.12(W) 5.80(D)  Sodium 135 - 145 mmol/L 136 134(L) 134(L)  Potassium 3.5 - 5.1 mmol/L  4.4 4.3 4.7  Chloride 98 - 111 mmol/L 106 106 104  CO2 22 - 32 mmol/L 23 22 21(L)  Calcium 8.9 - 10.3 mg/dL 8.2(L) 8.1(L) 8.2(L)   CBC Latest Ref Rng & Units 11/01/2020 10/30/2020 10/28/2020  WBC 4.0 - 10.5 K/uL 5.3 7.9 7.5  Hemoglobin 13.0 - 17.0 g/dL 6.2(I) 8.1(L) 8.5(L)  Hematocrit 39.0 - 52.0 % 29.2(L) 28.6(L) 29.2(L)  Platelets 150 - 400 K/uL 301 269 214   Discharge Instructions: Discharge Instructions     Call MD for:  difficulty breathing, headache or visual disturbances   Complete by: As directed    Call MD for:  extreme fatigue   Complete by: As directed    Call MD for:  persistant dizziness or light-headedness   Complete by: As directed    Call MD for:  persistant nausea and vomiting   Complete by: As directed    Call MD for:  temperature >100.4   Complete by: As directed    Diet - low sodium heart healthy   Complete by: As directed    Discharge instructions   Complete by: As directed    Mr. Chai, [place] were admitted to the hospital after having a heart attack that ultimately led to your heart stopping. Due to this, your kidneys suffered an injury but they are improving. A heart cath was performed that showed blockages, but none were able to be stented.   While here,  your medications were adjusted. Below is your new medication list.   Your medication list is:  Amiodarone (Helps with your heart rhythm) Amlodipine (Blood pressure medication)  Aspirin (Helps prevent additional blockages) Atorvastatin (Cholesterol medication) Carvedilol (Blood pressure medication)  Metformin (Diabetes medication)    Please make sure to follow up with your primary care doctor in the next 1 week. You also have an upcoming appointment with the heart doctors in 1 month.    It was a pleasure meeting you and we wish you the best!   Dr. Huel Cote   Increase activity slowly   Complete by: As directed       Signed: Dr. Verdene Lennert Internal Medicine PGY-3  Pager: [phone] After 5pm on weekdays and 1pm on weekends: On Call pager [phone]  11/02/2020, 7:48 PM

## 2020-10-19 NOTE — Progress Notes (Addendum)
HD#4 SUBJECTIVE:  Patient Summary: Darrell [name] is a 65 y.o. person with medical history significant of CAD s/p CABG (2009) and PCI, recent admission in August for NSTEMI, HFrEF (45-50%), CKD stage III, Hypertension, diabetes, and Hyperlipidemia who presented with complaints of chest pain and was admitted for NSTEMI. Went into [place] with subsequent asystole with eventual recovery of perfusing rhythm.  Transferred to ICU, with extubation the 09/20.  Overnight Events: Transferred back to IMTS from ICU. Patient was sitting up in chair.  He denied chest pain, shortness of breath, or other concerns.   OBJECTIVE:  Vital Signs: Vitals:   10/18/20 2010 10/18/20 2330 10/19/20 0445 10/19/20 0831  BP: 130/69 (!) 157/81 (!) 148/72 (!) 148/70  Pulse: 67 69 67 65  Resp: 18 20 19 20   Temp: 99 F (37.2 C) 97.6 F (36.4 C) 98.2 F (36.8 C) 98.2 F (36.8 C)  TempSrc: Oral Oral Oral Oral  SpO2: 99% 100% 99% 100%  Weight:      Height:       Supplemental O2: Nasal Cannula SpO2: 100 % O2 Flow Rate (L/min): 2 L/min FiO2 (%): 30 %  Filed Weights   10/15/20 0456 10/15/20 1700  Weight: 106.6 kg 98.2 kg     Intake/Output Summary (Last 24 hours) at 10/19/2020 1515 Last data filed at 10/19/2020 0500 Gross per 24 hour  Intake --  Output 1150 ml  Net -1150 ml   Net IO Since Admission: 1,709.59 mL [10/19/20 1515]  Physical Exam: General: Well-developed, answers questions appropriately, HENT: NCAT Eyes: No scleral icterus, conjunctiva clear CV: No murmurs, rubs or gallops Pulm: Clear to auscultation bilaterally, normal pulmonary effort GI: No tenderness, bowel sounds present MSK: Sitting up in chair, no edema present in legs bilaterally Psych: Depressed mood, flat affect Skin: warm and dry  Patient Lines/Drains/Airways Status     Active Line/Drains/Airways     Name Placement date Placement time Site Days   Peripheral IV 10/15/20 Anterior;Right Antecubital 10/15/20  0500   Antecubital  4   Peripheral IV 10/16/20 22 G 1" Left Hand 10/16/20  0935  Hand  3   CVC Triple Lumen 10/16/20 Left Subclavian 10/16/20  1039  -- 3   Urethral Catheter 10/18/20, RN Latex 10/16/20  1030  Latex  3            Pertinent Labs: CBC Latest Ref Rng & Units 10/19/2020 10/18/2020 10/17/2020  WBC 4.0 - 10.5 K/uL 8.5 8.8 12.0(H)  Hemoglobin 13.0 - 17.0 g/dL 10/19/2020) 1.7(C) 10.1(L)  Hematocrit 39.0 - 52.0 % 32.2(L) 30.4(L) 34.3(L)  Platelets 150 - 400 K/uL 228 189 223    CMP Latest Ref Rng & Units 10/19/2020 10/18/2020 10/17/2020  Glucose 70 - 99 mg/dL 10/19/2020) 967(R) 99  BUN 8 - 23 mg/dL 916(B) 84(Y) 65(L)  Creatinine 0.61 - 1.24 mg/dL 93(T) 7.01(X) 7.93(J)  Sodium 135 - 145 mmol/L 136 136 138  Potassium 3.5 - 5.1 mmol/L 3.1(L) 3.5 4.1  Chloride 98 - 111 mmol/L 102 103 106  CO2 22 - 32 mmol/L 22 23 23   Calcium 8.9 - 10.3 mg/dL 8.3(L) 8.2(L) 8.3(L)  Total Protein 6.5 - 8.1 g/dL - - -  Total Bilirubin 0.3 - 1.2 mg/dL - - -  Alkaline Phos 38 - 126 U/L - - -  AST 15 - 41 U/L - - -  ALT 0 - 44 U/L - - -    Recent Labs    10/18/20 2115 10/19/20 0619 10/19/20 1223  GLUCAP 123*  122* 128*     Pertinent Imaging: US RENAL  Result Date: 10/18/2020 CLINICAL DATA:  Acute kidney injury EXAM: RENAL / URINARY TRACT ULTRASOUND COMPLETE COMPARISON:  Renal ultrasound 08/06/2019 FINDINGS: Right Kidney: Renal measurements: 13.4 x 7.1 x 5.2 cm = volume: 260 mL. Cystic changes at the right renal hilum, difficult to delineate parapelvic cysts from hydronephrosis, although hydronephrosis is favored and was demonstrated on prior exam. There is thinning of the renal parenchyma. Shadowing stone in the upper kidney measures 2 cm. Left Kidney: Renal measurements: 11.1 x 5.7 x 6.0 cm = volume: 96 mL. Areas of cortical thinning and atrophy, better delineated on prior exam. No hydronephrosis. 2.7 cm cyst in the upper kidney. No visualized stone or solid lesion. Bladder: Decompressed by Foley catheter.  Other: None. IMPRESSION: 1. Cystic changes of the right renal hila favored to represent hydronephrosis, not significantly changed from prior renal ultrasound. There is associated cortical thinning suggesting this is a chronic process. 2. Probable 2 cm stone in the upper right kidney. 3. No left hydronephrosis.  Areas of left cortical thinning. Electronically Signed   By: Narda Rutherford M.D.   On: 10/18/2020 20:04    ASSESSMENT/PLAN:  Assessment: Principal Problem:   NSTEMI (non-ST elevated myocardial infarction) [facility] Active Problems:   Type 2 diabetes mellitus without complication, without long-term current use of insulin (HCC)   Adjustment disorder with depressed mood   Coronary atherosclerosis, s/p CABG in 2009    History of noncompliance with medical treatment, presenting hazards to health   AKI (acute kidney injury) (HCC)   Diabetes mellitus type 2 in nonobese (HCC)   Anemia   Chest pain   VT (ventricular tachycardia) (HCC)   Cardiac arrest (HCC)  Darrell [name] is a 65 y.o. person with medical history significant of CAD s/p CABG (2009) and PCI, recent admission in August for NSTEMI, HFrEF (45-50%), CKD stage III, Hypertension, diabetes, and Hyperlipidemia who presented with complaints of chest pain and was admitted for NSTEMI. Went into [place] with subsequent asystole with eventual recovery of perfusing rhythm.  Transferred to ICU, with extubation 09/20.   Plan: #Cardiac arrest#NSTEMI#acute hypoxic respiratory failure Patient originally admitted for chest pain on September 18 for NSTEMI. He refused a cardiac cath at that time. On September 19 he went into cardiac arrest with initial rhythm of ventricular tachycardia. Was intubated and transferred to the ICU.  The following day he was successfully extubated, then transferred back on 09/22.  Patient is now agreeable to cardiac catheterization, however his creatinine will need to improve before getting contrast.   - Cardiology follow  up and recommendations appreciated -Cardiac catheretization once creatinine improved -Continue heparin gtt for now -Will transition patient off IV amio gtt to Po Amiodarone 400 mcg -Keep magnesium greater than 2 and potassium greater than 4 -Repeat BMP  #Acute kidney injury Following cardiac arrest creatinine increased.  Was at 1.75 and has trended upwards over the last 3 days.  Today it is at 3.72.  This is most likely due to acute tubular necrosis in the setting of hypotension due to cardiac arrest.  Will continue to monitor BMP. Patient unable to undergo cardiac catheterization due to AKI. -repeat BMP -IV lasix 120 mg once  #Microcytic anemia Hemoglobin of 9.3 today.  Patient was transfused 2 units of blood 3 days ago.  Hemoglobin has been stable.  We will consider further work-up of microcytic anemia once condition stabilizes.  Best Practice: Diet: Cardiac diet IVF: Fluids: none VTE: on heparin  per pharmacy Code: Full Therapy Recs: Home Health, DME: bedside commode and tub transfer bench DISPO: Anticipated discharge  3-4 days  to Home with home health pending improvement in creatinine for cardiac catheretization.  Signature: Rudene Christians, D.O. Internal Medicine Resident, PGY-1 [facility]  Pager: [phone] 3:15 PM, 10/19/2020   Please contact the on call pager after 5 pm and on weekends at [phone].

## 2020-10-19 NOTE — Care Management Important Message (Signed)
Important Message  Patient Details  Name: Darrell [name] MRN: [id number] Date of Birth: 09/08/1955   Medicare Important Message Given:  Yes     Dorena Bodo 10/19/2020, 2:53 PM

## 2020-10-20 DIAGNOSIS — D649 Anemia, unspecified: Secondary | ICD-10-CM

## 2020-10-20 DIAGNOSIS — I469 Cardiac arrest, cause unspecified: Secondary | ICD-10-CM | POA: Diagnosis not present

## 2020-10-20 DIAGNOSIS — N179 Acute kidney failure, unspecified: Secondary | ICD-10-CM | POA: Diagnosis not present

## 2020-10-20 DIAGNOSIS — I214 Non-ST elevation (NSTEMI) myocardial infarction: Secondary | ICD-10-CM | POA: Diagnosis not present

## 2020-10-20 LAB — GLUCOSE, CAPILLARY
Glucose-Capillary: 105 mg/dL — ABNORMAL HIGH (ref 70–99)
Glucose-Capillary: 180 mg/dL — ABNORMAL HIGH (ref 70–99)
Glucose-Capillary: 148 mg/dL — ABNORMAL HIGH (ref 70–99)
Glucose-Capillary: 198 mg/dL — ABNORMAL HIGH (ref 70–99)

## 2020-10-20 LAB — HEPARIN LEVEL (UNFRACTIONATED)
Heparin Unfractionated: 0.7 IU/mL (ref 0.30–0.70)
Heparin Unfractionated: 0.25 IU/mL — ABNORMAL LOW (ref 0.30–0.70)

## 2020-10-20 LAB — BASIC METABOLIC PANEL
Anion gap: 12 (ref 5–15)
BUN: 36 mg/dL — ABNORMAL HIGH (ref 8–23)
CO2: 19 mmol/L — ABNORMAL LOW (ref 22–32)
Calcium: 8.1 mg/dL — ABNORMAL LOW (ref 8.9–10.3)
Chloride: 103 mmol/L (ref 98–111)
Creatinine, Ser: 3.89 mg/dL — ABNORMAL HIGH (ref 0.61–1.24)
GFR, Estimated: 16 mL/min — ABNORMAL LOW (ref >60)
Glucose, Bld: 99 mg/dL (ref 70–99)
Potassium: 4 mmol/L (ref 3.5–5.1)
Sodium: 134 mmol/L — ABNORMAL LOW (ref 135–145)

## 2020-10-20 LAB — CBC
HCT: 32.1 % — ABNORMAL LOW (ref 39.0–52.0)
Hemoglobin: 9.4 g/dL — ABNORMAL LOW (ref 13.0–17.0)
MCH: 23.6 pg — ABNORMAL LOW (ref 26.0–34.0)
MCHC: 29.3 g/dL — ABNORMAL LOW (ref 30.0–36.0)
MCV: 80.7 fL (ref 80.0–100.0)
Platelets: 257 10*3/uL (ref 150–400)
RBC: 3.98 MIL/uL — ABNORMAL LOW (ref 4.22–5.81)
RDW: 19 % — ABNORMAL HIGH (ref 11.5–15.5)
WBC: 9.3 10*3/uL (ref 4.0–10.5)
nRBC: 0 % (ref 0.0–0.2)

## 2020-10-20 LAB — MAGNESIUM: Magnesium: 2.1 mg/dL (ref 1.7–2.4)

## 2020-10-20 MED ORDER — CARVEDILOL 3.125 MG PO TABS
3.1250 mg | ORAL_TABLET | Freq: Two times a day (BID) | ORAL | Status: DC
  Administered 2020-10-20 – 2020-10-21 (×2): 3.125 mg via ORAL
  Filled 2020-10-20 (×2): qty 1

## 2020-10-20 NOTE — Progress Notes (Addendum)
HD#5 SUBJECTIVE:  Patient Summary: Darrell [name] is a 65 y.o. person with medical history significant of CAD s/p CABG (2009) and PCI, recent admission in August for NSTEMI, HFrEF (45-50%), CKD stage III, Hypertension, diabetes, and Hyperlipidemia who presented with complaints of chest pain and was admitted for NSTEMI. Went into [place] with subsequent asystole with eventual recovery of perfusing rhythm.  Transferred to ICU, with extubation the 09/20. Patient is now amenable to undergoing cardiac catheretization. However, creatinine will need to improve before he is able to do that. On hospital day 5.  Overnight Events: No overnight events.   OBJECTIVE:  Vital Signs: Vitals:   10/19/20 1520 10/19/20 1933 10/19/20 2311 10/20/20 0423  BP: 128/79 (!) 157/76 (!) 142/64 (!) 146/78  Pulse: 78 78 73 60  Resp: 20 18 18 18   Temp: 98.6 F (37 C) 98.4 F (36.9 C) 98.4 F (36.9 C) 98.3 F (36.8 C)  TempSrc: Oral Oral Oral Oral  SpO2: 94% 99% 95% 92%  Weight:      Height:       Supplemental O2: Nasal Cannula SpO2: 92 % O2 Flow Rate (L/min): 2 L/min FiO2 (%): 30 %  Filed Weights   10/15/20 0456 10/15/20 1700  Weight: 106.6 kg 98.2 kg     Intake/Output Summary (Last 24 hours) at 10/20/2020 10/22/2020 Last data filed at 10/20/2020 0430 Gross per 24 hour  Intake --  Output 600 ml  Net -600 ml   Net IO Since Admission: 1,109.59 mL [10/20/20 0609]  Physical Exam: General: Well developed, well nourished, sitting up in bed, alert HENT: NCAT Eyes: No scleral icterus, conjunctiva clear CV: No murmurs, rubs, or gallops, normal rate Pulm: CTAB, normal pulmonary effort GI: No tenderness, bowel sounds present MSK: No edema in legs bilaterally, normal bulk and tone Skin: Warm and dry Psych: Normal mood and affect  Patient Lines/Drains/Airways Status     Active Line/Drains/Airways     Name Placement date Placement time Site Days   Peripheral IV 10/15/20 Anterior;Right Antecubital  10/15/20  0500  Antecubital  5   Peripheral IV 10/16/20 22 G 1" Left Hand 10/16/20  0935  Hand  4   CVC Triple Lumen 10/16/20 Left Subclavian 10/16/20  1039  -- 4   Urethral Catheter 10/18/20, RN Latex 10/16/20  1030  Latex  4            Pertinent Labs: CBC Latest Ref Rng & Units 10/20/2020 10/19/2020 10/18/2020  WBC 4.0 - 10.5 K/uL 9.3 8.5 8.8  Hemoglobin 13.0 - 17.0 g/dL 10/20/2020) 9.6(E) 4.5(W)  Hematocrit 39.0 - 52.0 % 32.1(L) 32.2(L) 30.4(L)  Platelets 150 - 400 K/uL 257 228 189    CMP Latest Ref Rng & Units 10/20/2020 10/19/2020 10/18/2020  Glucose 70 - 99 mg/dL 99 10/20/2020) 119(J)  BUN 8 - 23 mg/dL 478(G) 95(A) 21(H)  Creatinine 0.61 - 1.24 mg/dL 08(M) 5.78(I) 6.96(E)  Sodium 135 - 145 mmol/L 134(L) 136 136  Potassium 3.5 - 5.1 mmol/L 4.0 3.1(L) 3.5  Chloride 98 - 111 mmol/L 103 102 103  CO2 22 - 32 mmol/L 19(L) 22 23  Calcium 8.9 - 10.3 mg/dL 8.1(L) 8.3(L) 8.2(L)  Total Protein 6.5 - 8.1 g/dL - - -  Total Bilirubin 0.3 - 1.2 mg/dL - - -  Alkaline Phos 38 - 126 U/L - - -  AST 15 - 41 U/L - - -  ALT 0 - 44 U/L - - -    Recent Labs    10/19/20  1223 10/19/20 1627 10/19/20 2126  GLUCAP 128* 121* 102*     Pertinent Imaging: No results found.  ASSESSMENT/PLAN:  Assessment: Principal Problem:   NSTEMI (non-ST elevated myocardial infarction) (HCC) Active Problems:   Type 2 diabetes mellitus without complication, without long-term current use of insulin (HCC)   Adjustment disorder with depressed mood   Coronary atherosclerosis, s/p CABG in 2009    History of noncompliance with medical treatment, presenting hazards to health   AKI (acute kidney injury) (HCC)   Diabetes mellitus type 2 in nonobese (HCC)   Anemia   Chest pain   VT (ventricular tachycardia) (HCC)   Cardiac arrest (HCC)  Darrell [name] is a 65 y.o. person with medical history significant of CAD s/p CABG (2009) and PCI, recent admission in August for NSTEMI, HFrEF (45-50%), CKD stage III,  Hypertension, diabetes, and Hyperlipidemia who presented with complaints of chest pain and was admitted for NSTEMI. Went into [place] with subsequent asystole with eventual recovery of perfusing rhythm.  Transferred to ICU, with extubation the 09/20. Patient is now amenable to undergoing cardiac catheretization. However, creatinine will need to improve before he is able to do that.  On hospital day 5.  Plan: #Cardiac arrest#NSTEMI#acute hypoxic respiratory failure Patient denies chest pain, shortness of breath, or other concerns on exam.  In setting of known vessel disease, coronary occlusion was likely the cause of his ventricular tachycardia leading to his cardiac arrest.  He remains amenable to cardiac catheterization, however his creatinine remains elevated with small increase from 3.72 to 3.89 today.   -Cardiology follow up and recommendations appreciated -Restarted Coreg 3.125 twice daily -Cardiac catheretization once creatinine improved -Continue heparin gtt for now -Will continue Po Amiodarone 400 mg daily -Keep magnesium greater than 2 and potassium greater than 4 -Monitor BMP closely  #Acute kidney injury on chronic kidney disease stage III Creatinine increased from 3.72-3.89 today.  Baseline appears to be around 1.5-1.8.  This is likely due to acute tubular necrosis in the setting of his cardiac arrest.  The trend of his creatinine increase seems to be slowing.  We will continue to monitor closely.  If continues to increase, may consider consulting nephrology.  Holding Lasix in the setting of acute kidney injury.  Monitor volume status.   -Follow BMP  #Microcytic anemia Hemoglobin of 9.3-->9.4 today.  Patient was transfused 2 units of blood 3 days ago.    Best Practice: Diet: HH diet IVF: Fluids: none VTE: Heparin Code: Full Therapy Recs:  home health , DME: bedside commode Family Contact: updated wife via phone DISPO: Anticipated discharge  following cardiac cath  to  Home  Signature: Darrell [name], D.O. Internal Medicine Resident, PGY-1 [facility]  Pager: [phone] 6:09 AM, 10/20/2020   Please contact the on call pager after 5 pm and on weekends at [phone].

## 2020-10-20 NOTE — Progress Notes (Signed)
ANTICOAGULATION CONSULT NOTE - Follow-Up Consult  Pharmacy Consult for heparin Indication: chest pain/ACS  No Known Allergies  Patient Measurements: Height: 6\' 1"  (185.4 cm) Weight: 98.2 kg (216 lb 7.9 oz) IBW/kg (Calculated) : 79.9 Heparin Dosing Weight: 98 kg   Vital Signs: Temp: 98.3 F (36.8 C) (09/23 0423) Temp Source: Oral (09/23 0423) BP: 146/78 (09/23 0423) Pulse Rate: 60 (09/23 0423)  Labs: Recent Labs    10/18/20 0517 10/19/20 0530 10/20/20 0450  HGB 9.1* 9.3* 9.4*  HCT 30.4* 32.2* 32.1*  PLT 189 228 257  HEPARINUNFRC 0.42 0.45 0.70  CREATININE 3.47* 3.72* 3.89*     Estimated Creatinine Clearance: 23.7 mL/min (A) (by C-G formula based on SCr of 3.89 mg/dL (H)).    Assessment: 2 yoM with recent NSTEMI but declined cardiac cath presents today with central chest pain. Troponins elevated. Pt was on heparin for planned 48h due to refusal of cath, now s/p VF arrest, heparin to resume pending possible change in cath plans. Hgb improved this morning - will lower goal rate and defer boluses.  Heparin level trending up to 0.7 this AM on IV heparin at 1600 units/hr.  No overt bleeding or complications noted, CBC fairly stable.  Awaiting cath, but Scr continues to rise.  Goal of Therapy:  Heparin level 0.3-0.5 units/ml Monitor platelets by anticoagulation protocol: Yes   Plan:  Decrease IV heparin to 1500 units/hr. F/u plans for cath. Daily heparin level and CBC.  77, Reece Leader, BCCP Clinical Pharmacist  10/20/2020 7:19 AM   [facility] pharmacy phone numbers are listed on [email or web address]

## 2020-10-20 NOTE — Progress Notes (Addendum)
Progress Note  Patient Name: Darrell [name] Date of Encounter: 10/20/2020  [facility] Cardiologist: Minus Breeding, MD   Subjective   Sitting up in bed eating, no complaints. No chest pain, was able to sleep flat last night.  Inpatient Medications    Scheduled Meds:  amiodarone  400 mg Oral Daily   aspirin EC  81 mg Oral Daily   atorvastatin  80 mg Oral QPM   chlorhexidine gluconate (MEDLINE KIT)  15 mL Mouth Rinse BID   Chlorhexidine Gluconate Cloth  6 each Topical Daily   insulin aspart  0-15 Units Subcutaneous TID WC   mouth rinse  15 mL Mouth Rinse 10 times per day   pantoprazole  40 mg Oral Daily   sodium chloride flush  10-40 mL Intracatheter Q12H   Continuous Infusions:  heparin 1,600 Units/hr (10/18/20 1400)   PRN Meds: acetaminophen, albuterol, alum & mag hydroxide-simeth, nitroGLYCERIN, ondansetron (ZOFRAN) IV, sodium chloride flush   Vital Signs    Vitals:   10/19/20 1520 10/19/20 1933 10/19/20 2311 10/20/20 0423  BP: 128/79 (!) 157/76 (!) 142/64 (!) 146/78  Pulse: 78 78 73 60  Resp: $Remo'20 18 18 18  'oeBOt$ Temp: 98.6 F (37 C) 98.4 F (36.9 C) 98.4 F (36.9 C) 98.3 F (36.8 C)  TempSrc: Oral Oral Oral Oral  SpO2: 94% 99% 95% 92%  Weight:      Height:        Intake/Output Summary (Last 24 hours) at 10/20/2020 0900 Last data filed at 10/20/2020 0430 Gross per 24 hour  Intake --  Output 600 ml  Net -600 ml   Last 3 Weights 10/15/2020 10/15/2020 09/06/2020  Weight (lbs) 216 lb 7.9 oz 235 lb 230 lb 4.8 oz  Weight (kg) 98.2 kg 106.595 kg 104.463 kg      Telemetry    Sinus HR 80s with PVCs - Personally Reviewed  ECG    No new tracings - Personally Reviewed  Physical Exam   GEN: elderly male in no acute distress.   Neck: No JVD Cardiac: RRR, no murmurs, rubs, or gallops.  Respiratory: Clear to auscultation bilaterally. GI: Soft, nontender, non-distended  MS: minimal B LE edema; No deformity. Neuro:  Nonfocal  Psych: Normal affect   Labs     High Sensitivity Troponin:   Recent Labs  Lab 10/15/20 0502 10/15/20 0750 10/16/20 0953 10/16/20 1143  TROPONINIHS 685* 902* 6,892* 6,596*     Chemistry Recent Labs  Lab 10/17/20 2246 10/18/20 0517 10/19/20 0530 10/20/20 0450  NA 138 136 136 134*  K 4.1 3.5 3.1* 4.0  CL 106 103 102 103  CO2 $Re'23 23 22 'CZB$ 19*  GLUCOSE 99 102* 118* 99  BUN 33* 34* 38* 36*  CREATININE 3.33* 3.47* 3.72* 3.89*  CALCIUM 8.3* 8.2* 8.3* 8.1*  MG 2.5*  --  2.1 2.1  GFRNONAA 20* 19* 17* 16*  ANIONGAP $RemoveB'9 10 12 12    'TENvdbaq$ Lipids No results for input(s): CHOL, TRIG, HDL, LABVLDL, LDLCALC, CHOLHDL in the last 168 hours.  Hematology Recent Labs  Lab 10/18/20 0517 10/19/20 0530 10/20/20 0450  WBC 8.8 8.5 9.3  RBC 3.90* 4.03* 3.98*  HGB 9.1* 9.3* 9.4*  HCT 30.4* 32.2* 32.1*  MCV 77.9* 79.9* 80.7  MCH 23.3* 23.1* 23.6*  MCHC 29.9* 28.9* 29.3*  RDW 18.3* 18.6* 19.0*  PLT 189 228 257   Thyroid No results for input(s): TSH, FREET4 in the last 168 hours.  BNPNo results for input(s): BNP, PROBNP in the last 168  hours.  DDimer  Recent Labs  Lab 10/15/20 1810  DDIMER 1.07*     Radiology    US RENAL  Result Date: 10/18/2020 CLINICAL DATA:  Acute kidney injury EXAM: RENAL / URINARY TRACT ULTRASOUND COMPLETE COMPARISON:  Renal ultrasound 08/06/2019 FINDINGS: Right Kidney: Renal measurements: 13.4 x 7.1 x 5.2 cm = volume: 260 mL. Cystic changes at the right renal hilum, difficult to delineate parapelvic cysts from hydronephrosis, although hydronephrosis is favored and was demonstrated on prior exam. There is thinning of the renal parenchyma. Shadowing stone in the upper kidney measures 2 cm. Left Kidney: Renal measurements: 11.1 x 5.7 x 6.0 cm = volume: 96 mL. Areas of cortical thinning and atrophy, better delineated on prior exam. No hydronephrosis. 2.7 cm cyst in the upper kidney. No visualized stone or solid lesion. Bladder: Decompressed by Foley catheter. Other: None. IMPRESSION: 1. Cystic changes of the  right renal hila favored to represent hydronephrosis, not significantly changed from prior renal ultrasound. There is associated cortical thinning suggesting this is a chronic process. 2. Probable 2 cm stone in the upper right kidney. 3. No left hydronephrosis.  Areas of left cortical thinning. Electronically Signed   By: Keith Rake M.D.   On: 10/18/2020 20:04    Cardiac Studies   Echo 10/16/20:  1. Hypokinesis of the inferior wall (base/mid) and apex. NO significant  change from echo from AUg 2022.Marland Kitchen Left ventricular ejection fraction, by  estimation, is 50 to 55%. The left ventricle has low normal function.  There is moderate left ventricular  hypertrophy.   2. Right ventricular systolic function is normal. The right ventricular  size is normal. There is normal pulmonary artery systolic pressure.   3. The mitral valve is normal in structure. Mild mitral valve  regurgitation.   4. The aortic valve is normal in structure. Aortic valve regurgitation is  trivial. Mild aortic valve sclerosis is present, with no evidence of  aortic valve stenosis.   5. The inferior vena cava is dilated in size with <50% respiratory  variability, suggesting right atrial pressure of 15 mmHg.   Patient Profile     65 y.o. male with a hx of CAD status post CABG and PCI's, hypertension, hyperlipidemia, diabetes, CKD stage III, chronic diastolic heart failure, bedbug infestation, medical noncompliance who is being seen 10/15/2020 for the evaluation of chest pain.  VT arrest 10/16/20, successful resuscitation. Transfused 2U PRBC's for anemia -spoke with family at bedside.  According to his wife, he has been compliant with meds, but ran out of them a few weeks ago - she was giving him meds. He had continued to refuse cath, even this admission. Echo yesterday read as no significant change compared to prior, LVEF 50-55%, however, to my read the LVEF is lower at around 40% with inferoapical and anterior hypokinesis.   Suspect coronary occlusion as the cause of VT. Troponin has risen significantly up to 6892 then 6596. Creatinine has risen to 2.64, suspect ATN. Potasium remains low at 3.4. Magnesium now 2.6. H/H improved to 9/30. Extubated 10/17/20. Pt now agreeable to heart cath, but will need to monitor AKI prior to contrast challenge.  Assessment & Plan    NSTEMI VT arrest Hx of CABG in 2009 Hypertension Hyperlipidemia with LDL goal < 70 - suspect VT arrest related to coronary blockage - troponin trended to 6892 --> 6596 - pt now agreeable to heart cath, previously refused - will need to monitor renal function through the weekend and consider heart cath on  Monday - amiodarone has been transitioned to PO dosing - continue ASA and heparin drip, lipitor - echo with low-normal LVEF of 50-55% and WMA - CO2 19, last dose of lasix 10/18/20 - dose not appear extremely volume up on exam today - not on anti-hypertensives - BP borderline controlled - consider restarting low dose coreg   Acute on chronic renal insufficiency stage III - sCr 3.89 - continues to rise, K 4.0 - baseline appears to be 1.5-1.8 - likely due to arrest and hypotension, may need to involve nephrology - last dose of lasix 10/18/20 - will hold for now given renal function continues to decline     For questions or updates, please contact [facility] Please consult [email or web address] for contact info under        Signed, Ledora Bottcher, PA  10/20/2020, 9:00 AM

## 2020-10-20 NOTE — Progress Notes (Signed)
ANTICOAGULATION CONSULT NOTE - Follow-Up Consult  Pharmacy Consult for IV Heparin Indication: chest pain/ACS  No Known Allergies  Patient Measurements: Height: 6\' 1"  (185.4 cm) Weight: 98.2 kg (216 lb 7.9 oz) IBW/kg (Calculated) : 79.9 Heparin Dosing Weight: 98 kg   Vital Signs: Temp: 99.4 F (37.4 C) (09/23 1659) Temp Source: Oral (09/23 1659) BP: 144/79 (09/23 1659) Pulse Rate: 78 (09/23 1659)  Labs: Recent Labs    10/18/20 0517 10/19/20 0530 10/20/20 0450 10/20/20 1640  HGB 9.1* 9.3* 9.4*  --   HCT 30.4* 32.2* 32.1*  --   PLT 189 228 257  --   HEPARINUNFRC 0.42 0.45 0.70 0.25*  CREATININE 3.47* 3.72* 3.89*  --     Estimated Creatinine Clearance: 23.7 mL/min (A) (by C-G formula based on SCr of 3.89 mg/dL (H)).  Assessment: 65 yr old man with recent NSTEMI (declined cardiac cath at that time) presented with CP, elevated troponins. Pt was on heparin for planned 48 hr due to refusal of cath, now S/P VF arrest; heparin to resume pending possible change in cath plans. Awaiting cath (pt now agreeable), but Scr continues to rise. Per provider note, plan to monitor renal function over the weekend and consider cath on Monday.  Heparin level ~7 hrs after heparin infusion was decreased to 1500 units/hr was 0.25 units/ml, which is below the goal range for this pt. H/H 9.4/32.1, plt 257 (CBC stable). Per RN, no issues with IV or bleeding observed.  Goal of Therapy:  Heparin level 0.3-0.5 units/ml (no boluses) Monitor platelets by anticoagulation protocol: Yes   Plan:  Increase heparin infusion to 1550 units/hr Check heparin level in 8 hrs Monitor daily heparin level, CBC Monitor for bleeding F/U cath plans  07-12-1971, PharmD, BCPS, [facility] Clinical Pharmacist  10/20/2020 6:44 PM

## 2020-10-21 DIAGNOSIS — I214 Non-ST elevation (NSTEMI) myocardial infarction: Secondary | ICD-10-CM | POA: Diagnosis not present

## 2020-10-21 LAB — BASIC METABOLIC PANEL
Anion gap: 6 (ref 5–15)
BUN: 38 mg/dL — ABNORMAL HIGH (ref 8–23)
CO2: 23 mmol/L (ref 22–32)
Calcium: 8 mg/dL — ABNORMAL LOW (ref 8.9–10.3)
Chloride: 105 mmol/L (ref 98–111)
Creatinine, Ser: 3.77 mg/dL — ABNORMAL HIGH (ref 0.61–1.24)
GFR, Estimated: 17 mL/min — ABNORMAL LOW (ref >60)
Glucose, Bld: 157 mg/dL — ABNORMAL HIGH (ref 70–99)
Potassium: 3.8 mmol/L (ref 3.5–5.1)
Sodium: 134 mmol/L — ABNORMAL LOW (ref 135–145)

## 2020-10-21 LAB — GLUCOSE, CAPILLARY
Glucose-Capillary: 180 mg/dL — ABNORMAL HIGH (ref 70–99)
Glucose-Capillary: 162 mg/dL — ABNORMAL HIGH (ref 70–99)
Glucose-Capillary: 126 mg/dL — ABNORMAL HIGH (ref 70–99)
Glucose-Capillary: 153 mg/dL — ABNORMAL HIGH (ref 70–99)

## 2020-10-21 LAB — CBC
HCT: 28 % — ABNORMAL LOW (ref 39.0–52.0)
Hemoglobin: 8.3 g/dL — ABNORMAL LOW (ref 13.0–17.0)
MCH: 23.6 pg — ABNORMAL LOW (ref 26.0–34.0)
MCHC: 29.6 g/dL — ABNORMAL LOW (ref 30.0–36.0)
MCV: 79.5 fL — ABNORMAL LOW (ref 80.0–100.0)
Platelets: 199 10*3/uL (ref 150–400)
RBC: 3.52 MIL/uL — ABNORMAL LOW (ref 4.22–5.81)
RDW: 19 % — ABNORMAL HIGH (ref 11.5–15.5)
WBC: 6 10*3/uL (ref 4.0–10.5)
nRBC: 0 % (ref 0.0–0.2)

## 2020-10-21 LAB — HEPARIN LEVEL (UNFRACTIONATED): Heparin Unfractionated: 0.34 IU/mL (ref 0.30–0.70)

## 2020-10-21 NOTE — Progress Notes (Signed)
Progress Note  Patient Name: Darrell [name] Date of Encounter: 10/21/2020  [facility] Cardiologist: Minus Breeding, MD   Subjective   No complaints  Inpatient Medications    Scheduled Meds:  amiodarone  400 mg Oral Daily   aspirin EC  81 mg Oral Daily   atorvastatin  80 mg Oral QPM   carvedilol  3.125 mg Oral BID WC   chlorhexidine gluconate (MEDLINE KIT)  15 mL Mouth Rinse BID   Chlorhexidine Gluconate Cloth  6 each Topical Daily   insulin aspart  0-15 Units Subcutaneous TID WC   mouth rinse  15 mL Mouth Rinse 10 times per day   pantoprazole  40 mg Oral Daily   sodium chloride flush  10-40 mL Intracatheter Q12H   Continuous Infusions:  heparin 1,550 Units/hr (10/21/20 0955)   PRN Meds: acetaminophen, albuterol, alum & mag hydroxide-simeth, nitroGLYCERIN, ondansetron (ZOFRAN) IV, sodium chloride flush   Vital Signs    Vitals:   10/20/20 2015 10/21/20 0013 10/21/20 0357 10/21/20 0732  BP: 126/73 134/72 (!) 159/79 140/82  Pulse: 77 72 73 73  Resp: $Remo'18 20 18 18  'whtwV$ Temp: 99.2 F (37.3 C) 98.6 F (37 C) 99.3 F (37.4 C) 99 F (37.2 C)  TempSrc: Oral Oral Oral Oral  SpO2: 99% 100% 90% 97%  Weight:      Height:        Intake/Output Summary (Last 24 hours) at 10/21/2020 1025 Last data filed at 10/20/2020 1659 Gross per 24 hour  Intake 690 ml  Output --  Net 690 ml   Last 3 Weights 10/15/2020 10/15/2020 09/06/2020  Weight (lbs) 216 lb 7.9 oz 235 lb 230 lb 4.8 oz  Weight (kg) 98.2 kg 106.595 kg 104.463 kg      Telemetry    SR - Personally Reviewed  ECG    N/a - Personally Reviewed  Physical Exam   GEN: No acute distress.   Neck: No JVD Cardiac: RRR, no murmurs, rubs, or gallops.  Respiratory: Clear to auscultation bilaterally. GI: Soft, nontender, non-distended  MS: No edema; No deformity. Neuro:  Nonfocal  Psych: Normal affect   Labs    High Sensitivity Troponin:   Recent Labs  Lab 10/15/20 0502 10/15/20 0750 10/16/20 0953 10/16/20 1143   TROPONINIHS 685* 902* 6,892* 6,596*     Chemistry Recent Labs  Lab 10/17/20 2246 10/18/20 0517 10/19/20 0530 10/20/20 0450 10/21/20 0320  NA 138   < > 136 134* 134*  K 4.1   < > 3.1* 4.0 3.8  CL 106   < > 102 103 105  CO2 23   < > 22 19* 23  GLUCOSE 99   < > 118* 99 157*  BUN 33*   < > 38* 36* 38*  CREATININE 3.33*   < > 3.72* 3.89* 3.77*  CALCIUM 8.3*   < > 8.3* 8.1* 8.0*  MG 2.5*  --  2.1 2.1  --   GFRNONAA 20*   < > 17* 16* 17*  ANIONGAP 9   < > $R'12 12 6   'eT$ < > = values in this interval not displayed.    Lipids No results for input(s): CHOL, TRIG, HDL, LABVLDL, LDLCALC, CHOLHDL in the last 168 hours.  Hematology Recent Labs  Lab 10/19/20 0530 10/20/20 0450 10/21/20 0320  WBC 8.5 9.3 6.0  RBC 4.03* 3.98* 3.52*  HGB 9.3* 9.4* 8.3*  HCT 32.2* 32.1* 28.0*  MCV 79.9* 80.7 79.5*  MCH 23.1* 23.6* 23.6*  MCHC 28.9*  29.3* 29.6*  RDW 18.6* 19.0* 19.0*  PLT 228 257 199   Thyroid No results for input(s): TSH, FREET4 in the last 168 hours.  BNPNo results for input(s): BNP, PROBNP in the last 168 hours.  DDimer  Recent Labs  Lab 10/15/20 1810  DDIMER 1.07*     Radiology    No results found.  Cardiac Studies     Patient Profile     65 y.o. male with a hx of CAD status post CABG and PCI's, hypertension, hyperlipidemia, diabetes, CKD stage III, chronic diastolic heart failure, bedbug infestation, medical noncompliance who is being seen 10/15/2020 for the evaluation of chest pain.   VT arrest 10/16/20, successful resuscitation. Transfused 2U PRBC's for anemia -spoke with family at bedside.  According to his wife, he has been compliant with meds, but ran out of them a few weeks ago - she was giving him meds. He had continued to refuse cath, even this admission. Echo yesterday read as no significant change compared to prior, LVEF 50-55%, however, to my read the LVEF is lower at around 40% with inferoapical and anterior hypokinesis.  Suspect coronary occlusion as the cause of  VT. Troponin has risen significantly up to 6892 then 6596. Creatinine has risen to 2.64, suspect ATN. Potasium remains low at 3.4. Magnesium now 2.6. H/H improved to 9/30. Extubated 10/17/20. Pt now agreeable to heart cath, but will need to monitor AKI prior to contrast challenge.    Assessment & Plan    NSTEMI/VT arrest - history of CABG in 2009 - VT arrest this admission, suspected to be due to obstructive coronary disease - echo LVEF 50-55% - from notes patient previously refused cath but now agrees, however cath on hold due to AKI - started on amio due to VT arresst, transitioned to oral - he is on hep gtt, coreg 3.$RemoveBefo'125mg'NsvPjbetouz$  bid, ASA 81, atorva 80. No ACE/[name] due to renal dysfunction  - cath timing pending renal function,drop in Hgb today would also need to be monitored.   2. AKI - Cr 3.77,likely ischemic injury from arrest - avoid nephrotoxic agents, follow labs   For questions or updates, please contact [facility] Please consult [email or web address] for contact info under        Signed, Carlyle Dolly, MD  10/21/2020, 10:25 AM

## 2020-10-21 NOTE — Progress Notes (Signed)
   Subjective:  HD6  Patient evaluated at bedside this AM. He states he is doing well, denies dyspnea, chest pain. Discussed plan for monitoring renal function, eventual cath.  Objective:  Vital signs in last 24 hours: Vitals:   10/20/20 2015 10/21/20 0013 10/21/20 0357 10/21/20 0732  BP: 126/73 134/72 (!) 159/79 140/82  Pulse: 77 72 73 73  Resp: 18 20 18 18   Temp: 99.2 F (37.3 C) 98.6 F (37 C) 99.3 F (37.4 C) 99 F (37.2 C)  TempSrc: Oral Oral Oral Oral  SpO2: 99% 100% 90% 97%  Weight:      Height:       General: Resting comfortably in no acute distress CV: Regular rate, rhythm. No murmurs appreciated Pulm: clear to auscultation bilaterally Neuro: Awake, alert, conversing appropriately  Assessment/Plan:  Darrell [name] is 65yo person with CAD s/p CABG (2009) and PCI's, chronic systolic heart failure, CKDIIIa, HTN, type 2 diabetes mellitus, HLD admitted 9/18 with NSTEMI and subsequently had Vtach cardiac arrest with successful ROSC, now back on floor monitoring renal function for eventual cath.  Principal Problem:   NSTEMI (non-ST elevated myocardial infarction) [facility]) Active Problems:   Type 2 diabetes mellitus without complication, without long-term current use of insulin (HCC)   Adjustment disorder with depressed mood   Coronary atherosclerosis, s/p CABG in 2009    History of noncompliance with medical treatment, presenting hazards to health   AKI (acute kidney injury) (HCC)   Diabetes mellitus type 2 in nonobese (HCC)   Anemia   Chest pain   VT (ventricular tachycardia) (HCC)   Cardiac arrest (HCC)  #NSTEMI #Vtach arrest #CAD s/p CABG, PCI's No acute changes overnight. Patient continues to be asymptomatic. No signs of further arrhythmias overnight. Appreciate cardiology's assistance and recommendations. Plan to continue heparin gtt, amiodarone. Monitoring renal function for cardiac catheterization hopefully early next week. - Heparin gtt - Amiodarone 400mg   daily - Carvedilol 3.125mg  twice daily - Cardiac catheterization pending renal function  #Acute on chronic renal insufficiency Renal function slightly improved today, sCr 3.7>3.9, GFR 17>16. Most consistent with ATN picture with recent cardiac arrest. Expect renal function to continue improving over the next few days. Continue holding nephrotoxic medications. - Daily BMP - Strict I/O  Prior to Admission Living Arrangement: Home Anticipated Discharge Location: pending PT/OT Barriers to Discharge: medical management Dispo: Anticipated discharge in approximately >2 day(s).   2010, MD 10/21/2020, 11:47 AM Pager: [phone] After 5pm on weekdays and 1pm on weekends: On Call pager [phone]

## 2020-10-21 NOTE — Progress Notes (Signed)
ANTICOAGULATION CONSULT NOTE - Follow-Up Consult  Pharmacy Consult for IV Heparin Indication: chest pain/ACS  No Known Allergies  Patient Measurements: Height: 6\' 1"  (185.4 cm) Weight: 98.2 kg (216 lb 7.9 oz) IBW/kg (Calculated) : 79.9 Heparin Dosing Weight: 98 kg   Vital Signs: Temp: 98.6 F (37 C) (09/24 0013) Temp Source: Oral (09/24 0013) BP: 134/72 (09/24 0013) Pulse Rate: 72 (09/24 0013)  Labs: Recent Labs    10/18/20 0517 10/19/20 0530 10/20/20 0450 10/20/20 1640 10/21/20 0320  HGB 9.1* 9.3* 9.4*  --   --   HCT 30.4* 32.2* 32.1*  --   --   PLT 189 228 257  --   --   HEPARINUNFRC 0.42 0.45 0.70 0.25* 0.34  CREATININE 3.47* 3.72* 3.89*  --   --     Estimated Creatinine Clearance: 23.7 mL/min (A) (by C-G formula based on SCr of 3.89 mg/dL (H)).  Assessment: 65 yr old man with recent NSTEMI (declined cardiac cath at that time) presented with CP, elevated troponins. Pt was on heparin for planned 48 hr due to refusal of cath, now S/P VF arrest; heparin to resume pending possible change in cath plans. Awaiting cath (pt now agreeable), but Scr continues to rise. Per provider note, plan to monitor renal function over the weekend and consider cath on Monday.  Heparin level ~7 hrs after heparin infusion was decreased to 1500 units/hr was 0.25 units/ml, which is below the goal range for this pt. H/H 9.4/32.1, plt 257 (CBC stable). Per RN, no issues with IV or bleeding observed.  HL came back at 0.34. Cont at current dose.  Goal of Therapy:  Heparin level 0.3-0.5 units/ml (no boluses) Monitor platelets by anticoagulation protocol: Yes   Plan:  Cont heparin infusion 1550 units/hr Monitor daily heparin level, CBC Monitor for bleeding F/U cath plans  07-12-1971, PharmD, BCIDP, AAHIVP, CPP Infectious Disease Pharmacist 10/21/2020 3:51 AM

## 2020-10-21 NOTE — Progress Notes (Addendum)
Pt found to have bedbug in hospital sock during bathing with NT. Specimen saved and EVS notified. Of note, pt has fungus on right great toe which has hole into which is where bug came out. Pt resting with call bell within reach.  Will continue to monitor. Thomas Hoff, RN

## 2020-10-22 DIAGNOSIS — I214 Non-ST elevation (NSTEMI) myocardial infarction: Secondary | ICD-10-CM | POA: Diagnosis not present

## 2020-10-22 DIAGNOSIS — N179 Acute kidney failure, unspecified: Secondary | ICD-10-CM | POA: Diagnosis not present

## 2020-10-22 LAB — BASIC METABOLIC PANEL
Anion gap: 10 (ref 5–15)
BUN: 37 mg/dL — ABNORMAL HIGH (ref 8–23)
CO2: 21 mmol/L — ABNORMAL LOW (ref 22–32)
Calcium: 8.3 mg/dL — ABNORMAL LOW (ref 8.9–10.3)
Chloride: 104 mmol/L (ref 98–111)
Creatinine, Ser: 3.19 mg/dL — ABNORMAL HIGH (ref 0.61–1.24)
GFR, Estimated: 21 mL/min — ABNORMAL LOW (ref >60)
Glucose, Bld: 119 mg/dL — ABNORMAL HIGH (ref 70–99)
Potassium: 3.9 mmol/L (ref 3.5–5.1)
Sodium: 135 mmol/L (ref 135–145)

## 2020-10-22 LAB — GLUCOSE, CAPILLARY
Glucose-Capillary: 132 mg/dL — ABNORMAL HIGH (ref 70–99)
Glucose-Capillary: 136 mg/dL — ABNORMAL HIGH (ref 70–99)
Glucose-Capillary: 156 mg/dL — ABNORMAL HIGH (ref 70–99)
Glucose-Capillary: 121 mg/dL — ABNORMAL HIGH (ref 70–99)

## 2020-10-22 LAB — CBC
HCT: 28.2 % — ABNORMAL LOW (ref 39.0–52.0)
Hemoglobin: 8.3 g/dL — ABNORMAL LOW (ref 13.0–17.0)
MCH: 23.1 pg — ABNORMAL LOW (ref 26.0–34.0)
MCHC: 29.4 g/dL — ABNORMAL LOW (ref 30.0–36.0)
MCV: 78.3 fL — ABNORMAL LOW (ref 80.0–100.0)
Platelets: 189 10*3/uL (ref 150–400)
RBC: 3.6 MIL/uL — ABNORMAL LOW (ref 4.22–5.81)
RDW: 19.2 % — ABNORMAL HIGH (ref 11.5–15.5)
WBC: 6.6 10*3/uL (ref 4.0–10.5)
nRBC: 0 % (ref 0.0–0.2)

## 2020-10-22 LAB — HEPARIN LEVEL (UNFRACTIONATED): Heparin Unfractionated: 0.42 IU/mL (ref 0.30–0.70)

## 2020-10-22 MED ORDER — CARVEDILOL 3.125 MG PO TABS
3.1250 mg | ORAL_TABLET | Freq: Once | ORAL | Status: DC
  Filled 2020-10-22: qty 1

## 2020-10-22 MED ORDER — CARVEDILOL 6.25 MG PO TABS
6.2500 mg | ORAL_TABLET | Freq: Two times a day (BID) | ORAL | Status: DC
  Administered 2020-10-22 – 2020-11-02 (×24): 6.25 mg via ORAL
  Filled 2020-10-22 (×24): qty 1

## 2020-10-22 MED ORDER — CARVEDILOL 6.25 MG PO TABS: 6.2500 mg | ORAL_TABLET | Freq: Two times a day (BID) | ORAL | Status: DC

## 2020-10-22 MED ORDER — AMLODIPINE BESYLATE 5 MG PO TABS
5.0000 mg | ORAL_TABLET | Freq: Every day | ORAL | Status: DC
  Administered 2020-10-22 – 2020-10-31 (×10): 5 mg via ORAL
  Filled 2020-10-22 (×10): qty 1

## 2020-10-22 NOTE — Progress Notes (Signed)
Physical Therapy Treatment Patient Details Name: Darrell [name] MRN: [id number] DOB: [birth year] Today's Date: 10/22/2020   History of Present Illness Darrell [name] is a 65 y.o. male who presents with complaints of chest pain. On 9/19, pt experienced cardiac arrest and intubated. Extubated on 9/20. Cardiology recommends heart catheterization in future after resolution of AKI. PMH: CHF, CABG (2009), NSTEMI (8/22), bedbug infestation, CKD, CAD, DM, HTN    PT Comments    Pt is self-limiting during session, requiring max encouragement to initially participate in mobility and later refusing ambulation attempts. Pt is impulsive and demonstrates deficits in awareness. Pt with continued impairments in balance, requiring PT assistance for safety during transfers. Pt will benefit from continued attempts at aggressive mobilization. If pt continues to refuse gait training may need to consider inpatient PT placement due to current falls risk.   Recommendations for follow up therapy are one component of a multi-disciplinary discharge planning process, led by the attending physician.  Recommendations may be updated based on patient status, additional functional criteria and insurance authorization.  Follow Up Recommendations  Home health PT;Supervision/Assistance - 24 hour     Equipment Recommendations  3in1 (PT)    Recommendations for Other Services       Precautions / Restrictions Precautions Precautions: Fall;Other (comment) Precaution Comments: incontinence Restrictions Weight Bearing Restrictions: No     Mobility  Bed Mobility Overal bed mobility: Needs Assistance Bed Mobility: Supine to Sit     Supine to sit: Supervision;HOB elevated          Transfers Overall transfer level: Needs assistance Equipment used: None Transfers: Sit to/from [employer] Transfers Sit to Stand: Min assist Stand pivot transfers: Min assist       General transfer comment: physical  assist needed to maintain lateral and A-P stability during pivot  Ambulation/Gait Ambulation/Gait assistance:  (pt refuses ambulation)               Stairs             Wheelchair Mobility    Modified Rankin (Stroke Patients Only)       Balance Overall balance assessment: Needs assistance Sitting-balance support: No upper extremity supported;Feet supported Sitting balance-Leahy Scale: Fair     Standing balance support: No upper extremity supported Standing balance-Leahy Scale: Poor Standing balance comment: requiring minA to maintain balance                            Cognition Arousal/Alertness: Awake/alert Behavior During Therapy: Flat affect;Impulsive Overall Cognitive Status: No family/caregiver present to determine baseline cognitive functioning                                 General Comments: pt with poor safety awareness, requires both verbal and tactile cueing to stop impulsive attempts to stand and transfer without assistance      Exercises      General Comments General comments (skin integrity, edema, etc.): VSS on RA      Pertinent Vitals/Pain Pain Assessment: No/denies pain    Home Living                      Prior Function            PT Goals (current goals can now be found in the care plan section) Acute Rehab PT Goals Patient Stated Goal: go home Progress towards PT  goals: Progressing toward goals (slowly, pt self-limiting today, refusing gait training)    Frequency    Min 3X/week      PT Plan Current plan remains appropriate    Co-evaluation              AM-PAC PT "6 Clicks" Mobility   Outcome Measure  Help needed turning from your back to your side while in a flat bed without using bedrails?: A Little Help needed moving from lying on your back to sitting on the side of a flat bed without using bedrails?: A Little Help needed moving to and from a bed to a chair (including a  wheelchair)?: A Little Help needed standing up from a chair using your arms (e.g., wheelchair or bedside chair)?: A Little Help needed to walk in hospital room?: A Lot Help needed climbing 3-5 steps with a railing? : A Lot 6 Click Score: 16    End of Session   Activity Tolerance: Patient tolerated treatment well Patient left: in chair;with call bell/phone within reach;with chair alarm set Nurse Communication: Mobility status PT Visit Diagnosis: Other abnormalities of gait and mobility (R26.89);Unsteadiness on feet (R26.81);Muscle weakness (generalized) (M62.81)     Time: 3614-4315 PT Time Calculation (min) (ACUTE ONLY): 13 min  Charges:  $Therapeutic Activity: 8-22 mins                     Arlyss Gandy, PT, DPT Acute Rehabilitation Pager: [phone]    Arlyss Gandy 10/22/2020, 2:55 PM

## 2020-10-22 NOTE — Progress Notes (Signed)
Please see yesterday's rounding note. Awaiting renal function to improve to proceed with cath. Cr appears to have plateaud and starting to trend down but still far out from range to consider cath. Continue current medical therapy today. Significantly hypertensive, increase coreg to 6.25mg  bid and norvasc 2.5mg  daily. If renal function completely normalizes over time swap out norvasc for ACE or [name]. Continue to monitor Hgb on heparin. No additional recs today.    Dina Rich MD

## 2020-10-22 NOTE — Progress Notes (Signed)
ANTICOAGULATION CONSULT NOTE - Follow-Up Consult  Pharmacy Consult for IV Heparin Indication: chest pain/ACS  No Known Allergies  Patient Measurements: Height: 6\' 1"  (185.4 cm) Weight: 98.2 kg (216 lb 7.9 oz) IBW/kg (Calculated) : 79.9 Heparin Dosing Weight: 98 kg   Vital Signs: Temp: 99.1 F (37.3 C) (09/25 0520) Temp Source: Oral (09/25 0520) BP: 174/91 (09/25 0520) Pulse Rate: 78 (09/25 0520)  Labs: Recent Labs    10/20/20 0450 10/20/20 1640 10/21/20 0320 10/22/20 0500  HGB 9.4*  --  8.3* 8.3*  HCT 32.1*  --  28.0* 28.2*  PLT 257  --  199 189  HEPARINUNFRC 0.70 0.25* 0.34 0.42  CREATININE 3.89*  --  3.77* 3.19*    Estimated Creatinine Clearance: 28.9 mL/min (A) (by C-G formula based on SCr of 3.19 mg/dL (H)).  Assessment: 65 yr old man with recent NSTEMI (declined cardiac cath at that time) presented with CP, elevated troponins. Pt was on heparin for planned 48 hr due to refusal of cath, now S/P VF arrest; heparin to resume pending possible change in cath plans. Awaiting cath (pt now agreeable), but Scr continues to rise. Per provider note, plan to monitor renal function over the weekend and consider cath on Monday.  Heparin level is therapeutic at 0.42 on 1550 units/hr. CBC stable - Hgb 8-9s and Pltc are normal. No signs of bleeding or infusion problems.  Goal of Therapy:  Heparin level 0.3-0.5 units/ml (no boluses) Monitor platelets by anticoagulation protocol: Yes   Plan:  Cont heparin infusion 1550 units/hr Monitor daily heparin level, CBC Monitor for bleeding F/U cath plans  Monday, PharmD PGY1 Pharmacy Resident 10/22/2020  7:27 AM  Please check [email or web address] for unit-specific pharmacy phone numbers.

## 2020-10-22 NOTE — Progress Notes (Addendum)
   Subjective:  [name]  Patient evaluated at bedside this AM. Has no current complaints, denies dyspnea or chest pain. Discussed improving renal funciton.  Objective:  Vital signs in last 24 hours: Vitals:   10/22/20 0520 10/22/20 0751 10/22/20 0905 10/22/20 1146  BP: (!) 174/91 (!) 181/99 (!) 148/72 140/76  Pulse: 78 87 86 69  Resp: 19 18  20   Temp: 99.1 F (37.3 C) 98.8 F (37.1 C)  98.2 F (36.8 C)  TempSrc: Oral Oral  Oral  SpO2: 97% 99% 96% 95%  Weight:      Height:       General: Resting comfortably in bed, no acute distress CV: Regular rate, rhythm. No murmurs. Warm extremities. Pulm: Normal work of breathing, clear to auscultation bilaterally Neuro: Awake, alert, conversing appropriately  Assessment/Plan:  Darrell [name] is 65yo person with CAD s/p CABG (2009) and PCI's, chronic systolic heart failure, CKDIIIa, HTN, type 2 diabetes mellitus, HLD admitted 9/18 with NSTEMI with subsequent Vtach, PEA arrest with successful ROSC, now plans for heart cath pending improvement of renal function 2/2 ATN.  Principal Problem:   NSTEMI (non-ST elevated myocardial infarction) [facility]) Active Problems:   Type 2 diabetes mellitus without complication, without long-term current use of insulin (HCC)   Adjustment disorder with depressed mood   Coronary atherosclerosis, s/p CABG in 2009    History of noncompliance with medical treatment, presenting hazards to health   AKI (acute kidney injury) (HCC)   Diabetes mellitus type 2 in nonobese (HCC)   Anemia   Chest pain   VT (ventricular tachycardia) (HCC)   Cardiac arrest (HCC)  #NSTEMI #Vtach Arrest  #CAD s/p CABG, PCI No acute changes overnight, patient continues to be asymptomatic. Continuing on heparin drip and amiodarone. Platelets have slowly downtrended, 189 this morning from 257 a few days ago. Will need to continue monitoring while on heparin. Hgb stable. Appreciate cardiology's assistance and recommendations. - Continue  heparin gtt, amiodarone - Increase carvedilol 6.25mg  twice daily - Cardiac catheterization pending renal function - Daily CBC  #Acute on chronic renal insufficiency #ATN Renal function continues to improve, 3.2<3.7. Will continue to monitor, will likely take a few more days to be safe for cath. - Daily BMP - Strict I/O  Prior to Admission Living Arrangement: Home Anticipated Discharge Location: pending PT/OT Barriers to Discharge: medical management Dispo: Anticipated discharge in approximately >2 day(s).   2010, MD 10/22/2020, 12:32 PM Pager: [phone] After 5pm on weekdays and 1pm on weekends: On Call pager [phone]

## 2020-10-23 ENCOUNTER — Inpatient Hospital Stay (HOSPITAL_COMMUNITY): Payer: Medicare Other

## 2020-10-23 DIAGNOSIS — N179 Acute kidney failure, unspecified: Secondary | ICD-10-CM | POA: Diagnosis not present

## 2020-10-23 DIAGNOSIS — I469 Cardiac arrest, cause unspecified: Secondary | ICD-10-CM | POA: Diagnosis not present

## 2020-10-23 DIAGNOSIS — I214 Non-ST elevation (NSTEMI) myocardial infarction: Secondary | ICD-10-CM | POA: Diagnosis not present

## 2020-10-23 DIAGNOSIS — I472 Ventricular tachycardia: Secondary | ICD-10-CM | POA: Diagnosis not present

## 2020-10-23 LAB — CULTURE, BLOOD (ROUTINE X 2)
Culture: NO GROWTH
Culture: NO GROWTH
Special Requests: ADEQUATE
Special Requests: ADEQUATE

## 2020-10-23 LAB — GLUCOSE, CAPILLARY
Glucose-Capillary: 108 mg/dL — ABNORMAL HIGH (ref 70–99)
Glucose-Capillary: 148 mg/dL — ABNORMAL HIGH (ref 70–99)
Glucose-Capillary: 137 mg/dL — ABNORMAL HIGH (ref 70–99)
Glucose-Capillary: 161 mg/dL — ABNORMAL HIGH (ref 70–99)

## 2020-10-23 LAB — CBC
HCT: 26.2 % — ABNORMAL LOW (ref 39.0–52.0)
Hemoglobin: 8 g/dL — ABNORMAL LOW (ref 13.0–17.0)
MCH: 23.3 pg — ABNORMAL LOW (ref 26.0–34.0)
MCHC: 30.5 g/dL (ref 30.0–36.0)
MCV: 76.2 fL — ABNORMAL LOW (ref 80.0–100.0)
Platelets: 199 10*3/uL (ref 150–400)
RBC: 3.44 MIL/uL — ABNORMAL LOW (ref 4.22–5.81)
RDW: 19.6 % — ABNORMAL HIGH (ref 11.5–15.5)
WBC: 9.9 10*3/uL (ref 4.0–10.5)
nRBC: 0 % (ref 0.0–0.2)

## 2020-10-23 LAB — BASIC METABOLIC PANEL
Anion gap: 11 (ref 5–15)
BUN: 38 mg/dL — ABNORMAL HIGH (ref 8–23)
CO2: 19 mmol/L — ABNORMAL LOW (ref 22–32)
Calcium: 8.2 mg/dL — ABNORMAL LOW (ref 8.9–10.3)
Chloride: 104 mmol/L (ref 98–111)
Creatinine, Ser: 3.14 mg/dL — ABNORMAL HIGH (ref 0.61–1.24)
GFR, Estimated: 21 mL/min — ABNORMAL LOW (ref >60)
Glucose, Bld: 123 mg/dL — ABNORMAL HIGH (ref 70–99)
Potassium: 3.8 mmol/L (ref 3.5–5.1)
Sodium: 134 mmol/L — ABNORMAL LOW (ref 135–145)

## 2020-10-23 LAB — URINALYSIS, ROUTINE W REFLEX MICROSCOPIC
Bilirubin Urine: NEGATIVE
Glucose, UA: NEGATIVE mg/dL
Ketones, ur: 5 mg/dL — AB
Nitrite: NEGATIVE
Protein, ur: 30 mg/dL — AB
RBC / HPF: >50 RBC/hpf — ABNORMAL HIGH (ref 0–5)
Specific Gravity, Urine: 1.014 (ref 1.005–1.030)
WBC, UA: >50 WBC/hpf — ABNORMAL HIGH (ref 0–5)
pH: 5 (ref 5.0–8.0)

## 2020-10-23 LAB — HEPARIN LEVEL (UNFRACTIONATED): Heparin Unfractionated: 0.37 IU/mL (ref 0.30–0.70)

## 2020-10-23 MED ORDER — POTASSIUM CHLORIDE CRYS ER 20 MEQ PO TBCR
40.0000 meq | EXTENDED_RELEASE_TABLET | Freq: Once | ORAL | Status: AC
  Administered 2020-10-23: 40 meq via ORAL
  Filled 2020-10-23: qty 2

## 2020-10-23 MED ORDER — AMIODARONE HCL IN DEXTROSE 360-4.14 MG/200ML-% IV SOLN
30.0000 mg/h | INTRAVENOUS | Status: DC
  Administered 2020-10-23 – 2020-10-25 (×7): 60 mg/h via INTRAVENOUS
  Administered 2020-10-25 – 2020-10-31 (×12): 30 mg/h via INTRAVENOUS
  Filled 2020-10-23 (×20): qty 200

## 2020-10-23 NOTE — Progress Notes (Signed)
Progress Note  Patient Name: Darrell [name] Date of Encounter: 10/23/2020  [facility] Cardiologist: Darrell Breeding, MD   Subjective   Denies CP or SOB, does not feel palpitations, no presyncope or syncope  Inpatient Medications    Scheduled Meds:  amiodarone  400 mg Oral Daily   amLODipine  5 mg Oral Daily   aspirin EC  81 mg Oral Daily   atorvastatin  80 mg Oral QPM   carvedilol  6.25 mg Oral BID WC   chlorhexidine gluconate (MEDLINE KIT)  15 mL Mouth Rinse BID   Chlorhexidine Gluconate Cloth  6 each Topical Daily   insulin aspart  0-15 Units Subcutaneous TID WC   pantoprazole  40 mg Oral Daily   sodium chloride flush  10-40 mL Intracatheter Q12H   Continuous Infusions:  heparin 1,550 Units/hr (10/22/20 2100)   PRN Meds: acetaminophen, albuterol, alum & mag hydroxide-simeth, nitroGLYCERIN, ondansetron (ZOFRAN) IV, sodium chloride flush   Vital Signs    Vitals:   10/22/20 1925 10/23/20 0002 10/23/20 0117 10/23/20 0315  BP: (!) 143/77 (!) 160/91  (!) 144/81  Pulse: 71 89  74  Resp: _0 Temp: 98.2 F (36.8 C) (!) 103.1 F (39.5 C) (!) 100.8 F (38.2 C) 100.3 F (37.9 C)  TempSrc: Oral Oral  Oral  SpO2: 100% 98%  96%  Weight:      Height:        Intake/Output Summary (Last 24 hours) at 10/23/2020 4970 Last data filed at 10/23/2020 2637 Gross per 24 hour  Intake 712.79 ml  Output 1778 ml  Net -1065.21 ml   Last 3 Weights 10/15/2020 10/15/2020 09/06/2020  Weight (lbs) 216 lb 7.9 oz 235 lb 230 lb 4.8 oz  Weight (kg) 98.2 kg 106.595 kg 104.463 kg      Telemetry    SR, freq PVCs, pairs, bigeminy, short runs Torsades - Personally Reviewed  ECG    N/a - Personally Reviewed  Physical Exam   General: Well developed, well nourished, male in no acute distress Head: Eyes PERRLA, Head normocephalic and atraumatic Lungs: clear bilaterally to auscultation. Heart: HRRR S1 S2, without rub or gallop. No murmur. 4/4 extremity pulses are 2+ & equal. No  JVD. Abdomen: Bowel sounds are present, abdomen soft and non-tender without masses or  hernias noted. Msk: Normal strength and tone for age. Extremities: No clubbing, cyanosis or edema.    Skin:  No rashes or lesions noted. Neuro: Alert and oriented X 3. Psych:  Good affect, responds appropriately  Labs    High Sensitivity Troponin:   Recent Labs  Lab 10/15/20 0502 10/15/20 0750 10/16/20 0953 10/16/20 1143  TROPONINIHS 685* 902* 6,892* 6,596*     Chemistry Recent Labs  Lab 10/17/20 2246 10/18/20 0517 10/19/20 0530 10/20/20 0450 10/21/20 0320 10/22/20 0500 10/23/20 0429  NA 138   < > 136 134* 134* 135 134*  K 4.1   < > 3.1* 4.0 3.8 3.9 3.8  CL 106   < > 102 103 105 104 104  CO2 23   < > 22 19* 23 21* 19*  GLUCOSE 99   < > 118* 99 157* 119* 123*  BUN 33*   < > 38* 36* 38* 37* 38*  CREATININE 3.33*   < > 3.72* 3.89* 3.77* 3.19* 3.14*  CALCIUM 8.3*   < > 8.3* 8.1* 8.0* 8.3* 8.2*  MG 2.5*  --  2.1 2.1  --   --   --  GFRNONAA 20*   < > 17* 16* 17* 21* 21*  ANIONGAP 9   < > _0 < > = values in this interval not displayed.    Lipids No results for input(s): CHOL, TRIG, HDL, LABVLDL, LDLCALC, CHOLHDL in the last 168 hours.  Hematology Recent Labs  Lab 10/21/20 0320 10/22/20 0500 10/23/20 0429  WBC 6.0 6.6 9.9  RBC 3.52* 3.60* 3.44*  HGB 8.3* 8.3* 8.0*  HCT 28.0* 28.2* 26.2*  MCV 79.5* 78.3* 76.2*  MCH 23.6* 23.1* 23.3*  MCHC 29.6* 29.4* 30.5  RDW 19.0* 19.2* 19.6*  PLT 199 189 199   Thyroid No results for input(s): TSH, FREET4 in the last 168 hours.  BNPNo results for input(s): BNP, PROBNP in the last 168 hours.  DDimer  No results for input(s): DDIMER in the last 168 hours.   Lab Results  Component Value Date   HGBA1C 5.6 09/03/2020   Lab Results  Component Value Date   CHOL 167 09/03/2020   HDL 38 (L) 09/03/2020   LDLCALC 120 (H) 09/03/2020   TRIG 43 09/03/2020   CHOLHDL 4.4 09/03/2020   Magnesium  Date Value Ref Range Status   10/20/2020 2.1 1.7 - 2.4 mg/dL Final    Comment:    Performed at [facility], [place] [address] [place], [place] [address]  10/19/2020 2.1 1.7 - 2.4 mg/dL Final    Comment:    Performed at [facility] [address], [place], [place] [address]  10/17/2020 2.5 (H) 1.7 - 2.4 mg/dL Final    Comment:    Performed at [facility] [address] [place], [place] [address]      Radiology    No results found.  Cardiac Studies   CARDIAC CATH: 05/04/2018 Mid RCA lesion is 100% stenosed. Ost RCA to Prox RCA lesion is 99% stenosed. Ost LAD to Prox LAD lesion is 100% stenosed. Mid Cx to Dist Cx lesion is 100% stenosed. Origin to Prox Graft lesion is 100% stenosed. Mid LAD to Dist LAD lesion is 90% stenosed with 100% stenosed side branch in Ost 3rd Sept. Origin to Prox Graft lesion is 100% stenosed. Ost Cx to Prox Cx lesion is 99% stenosed. Scoring balloon angioplasty was performed using a BALLOON WOLVERINE 2.75X10. Post intervention, there is a 30% residual stenosis. The left ventricular ejection fraction is 50-55% by visual estimate. LV end diastolic pressure is normal. IMPRESSION: [name] had anatomy fairly similar to what it was in 2017.  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  The mid to distal AV groove circumflex stent was occluded at this point whereas 3 years ago it was subtotally occluded.  His distal LAD beyond LIMA insertion was diffusely and severely diseased as well.    I performed Cutting Balloon atherectomy reducing a 99% lesion to less than 30%.  Patient tolerated procedure well.  He was already on aspirin Plavix.  Angiomax was turned off.  He left the lab in stable condition on IV nitroglycerin for blood pressure control.  The sheath will be removed and pressure held.  Patient will be gently hydrated and most likely discharged home tomorrow on aspirin and Plavix.   Intervention    Patient Profile     65 y.o. male with a hx of CAD status post CABG and PCI's, hypertension, hyperlipidemia, diabetes, CKD stage III, chronic diastolic heart failure, bedbug infestation, medical noncompliance  who is being seen 10/15/2020 for the evaluation of chest pain.   VT arrest 10/16/20, successful resuscitation. Transfused 2U PRBC's for anemia -spoke with family at bedside.  According to his wife, he has been compliant with meds, but ran out of them a few weeks ago - she was giving him meds. He had continued to refuse cath, even this admission. Echo yesterday read as no significant change compared to prior, LVEF 50-55%, however, to my read the LVEF is lower at around 40% with inferoapical and anterior hypokinesis.  Suspect coronary occlusion as the cause of VT. Troponin has risen significantly up to 6892 then 6596. Creatinine has risen to 2.64, suspect ATN. Potasium remains low at 3.4. Magnesium now 2.6. H/H improved to 9/30. Extubated 10/17/20. Pt now agreeable to heart cath, but will need to monitor AKI prior to contrast challenge.    Assessment & Plan    NSTEMI/VT arrest - s/p CABG 2009 w/ subsequent PCIs, last cath 2020, see above - s/p VT arrest, felt ischemic - pt had refused cath, now agrees, however, cannot cath at this time due to decreased renal function  - amio started this admit, now 400 mg qd, but still w/ sig vent ectopy, discuss options w/ MD - Mg 2.1, has not been low - increase Coreg, but perhaps metoprolol would be better, no bradycardia  2. AKI - Cr peak 3.89, felt 2nd arrest, ?ATN - baseline seems to be 1.5 - 1.9 - avoid nephrotoxic agents   For questions or updates, please contact [place] Please consult [email or web address] for contact info under        Signed, Rosaria Ferries, PA-C  10/23/2020, 7:05 AM

## 2020-10-23 NOTE — Progress Notes (Signed)
ANTICOAGULATION CONSULT NOTE - Follow-Up Consult  Pharmacy Consult for IV Heparin Indication: chest pain/ACS  No Known Allergies  Patient Measurements: Height: 6\' 1"  (185.4 cm) Weight: 98.2 kg (216 lb 7.9 oz) IBW/kg (Calculated) : 79.9 Heparin Dosing Weight: 98 kg   Vital Signs: Temp: 100.3 F (37.9 C) (09/26 0315) Temp Source: Oral (09/26 0315) BP: 144/81 (09/26 0315) Pulse Rate: 74 (09/26 0315)  Labs: Recent Labs    10/21/20 0320 10/22/20 0500 10/23/20 0429  HGB 8.3* 8.3* 8.0*  HCT 28.0* 28.2* 26.2*  PLT 199 189 199  HEPARINUNFRC 0.34 0.42 0.37  CREATININE 3.77* 3.19* 3.14*    Estimated Creatinine Clearance: 29.3 mL/min (A) (by C-G formula based on SCr of 3.14 mg/dL (H)).  Assessment: 65 yr old man with recent NSTEMI (declined cardiac cath at that time) presented with CP, elevated troponins. Pt was on heparin for planned 48 hr due to refusal of cath, now S/P VF arrest; heparin to resume pending possible change in cath plans. Awaiting cath (pt now agreeable), but Scr still elevated (trend down)  Heparin level is therapeutic at 0.37 on 1550 units/hr. CBC stable.  Goal of Therapy:  Heparin level 0.3-0.5 units/ml (no boluses) Monitor platelets by anticoagulation protocol: Yes   Plan:  Cont heparin infusion 1550 units/hr Monitor daily heparin level, CBC Monitor for bleeding F/U cath plans  77, PharmD Clinical Pharmacist **Pharmacist phone directory can now be found on [email or web address] ([email or web address]).  Listed under [facility].

## 2020-10-23 NOTE — Progress Notes (Signed)
HD#8 SUBJECTIVE:  Patient Summary: Darrell [name] is a 65 y.o. with a pertinent PMH of CAD status post CABG, 2009 class, and PCI's, HFrEF, CKDIIIa, HTN, type 2 diabetes mellitus, HLD, who was admitted with NSTEMI with subsequent Vtach, PEA arrest with successful ROSC, now plans for heart cath pending improvement of renal function 2/2 ATN on hospital day 8.     Overnight Events: Patient had a fever of 103.1 F overnight.  He denies chills, shortness of breath, coughing, dysuria, and any concerns.  OBJECTIVE:  Vital Signs: Vitals:   10/22/20 1925 10/23/20 0002 10/23/20 0117 10/23/20 0315  BP: (!) 143/77 (!) 160/91  (!) 144/81  Pulse: 71 89  74  Resp: 16 20  18   Temp: 98.2 F (36.8 C) (!) 103.1 F (39.5 C) (!) 100.8 F (38.2 C) 100.3 F (37.9 C)  TempSrc: Oral Oral  Oral  SpO2: 100% 98%  96%  Weight:      Height:       Supplemental O2: Room Air SpO2: 96 % O2 Flow Rate (L/min): 2 L/min FiO2 (%): 30 %  Filed Weights   10/15/20 0456 10/15/20 1700  Weight: 106.6 kg 98.2 kg     Intake/Output Summary (Last 24 hours) at 10/23/2020 10/25/2020 Last data filed at 10/23/2020 0003 Gross per 24 hour  Intake 712.79 ml  Output 1327 ml  Net -614.21 ml   Net IO Since Admission: 755.22 mL [10/23/20 0614]  Physical Exam: General: Well developed, well nourished, sitting comfortably in bed, in no acute distress HENT: NCAT Eyes: No scleral icterus, conjunctiva clear CV: No murmurs, rubs, or gallops Pulm: Clear to auscultation bilaterally, pulmonary effort normal GI: No tenderness, bowel sounds present MSK: No pitting edema in lower extremities bilaterally, normal bulk and tone Skin: Warm and dry Psych: Normal mood and affect  Patient Lines/Drains/Airways Status     Active Line/Drains/Airways     Name Placement date Placement time Site Days   Peripheral IV 10/16/20 22 G 1" Left Hand 10/16/20  0935  Hand  7   CVC Triple Lumen 10/16/20 Left Subclavian 10/16/20  1039  -- 7   Urethral  Catheter 10/18/20, RN Latex 10/16/20  1030  Latex  7            Pertinent Labs: CBC Latest Ref Rng & Units 10/23/2020 10/22/2020 10/21/2020  WBC 4.0 - 10.5 K/uL 9.9 6.6 6.0  Hemoglobin 13.0 - 17.0 g/dL 8.0(L) 8.3(L) 8.3(L)  Hematocrit 39.0 - 52.0 % 26.2(L) 28.2(L) 28.0(L)  Platelets 150 - 400 K/uL 199 189 199    CMP Latest Ref Rng & Units 10/23/2020 10/22/2020 10/21/2020  Glucose 70 - 99 mg/dL 10/23/2020) 967(E) 938(B)  BUN 8 - 23 mg/dL 017(P) 10(C) 58(N)  Creatinine 0.61 - 1.24 mg/dL 27(P) 8.24(M) 3.53(I)  Sodium 135 - 145 mmol/L 134(L) 135 134(L)  Potassium 3.5 - 5.1 mmol/L 3.8 3.9 3.8  Chloride 98 - 111 mmol/L 104 104 105  CO2 22 - 32 mmol/L 19(L) 21(L) 23  Calcium 8.9 - 10.3 mg/dL 8.2(L) 8.3(L) 8.0(L)  Total Protein 6.5 - 8.1 g/dL - - -  Total Bilirubin 0.3 - 1.2 mg/dL - - -  Alkaline Phos 38 - 126 U/L - - -  AST 15 - 41 U/L - - -  ALT 0 - 44 U/L - - -    Recent Labs    10/22/20 1144 10/22/20 1709 10/22/20 2116  GLUCAP 136* 156* 121*     Pertinent Imaging: No results found.  ASSESSMENT/PLAN:  Assessment: Principal Problem:   NSTEMI (non-ST elevated myocardial infarction) (HCC) Active Problems:   Type 2 diabetes mellitus without complication, without long-term current use of insulin (HCC)   Adjustment disorder with depressed mood   Coronary atherosclerosis, s/p CABG in 2009    History of noncompliance with medical treatment, presenting hazards to health   AKI (acute kidney injury) (HCC)   Diabetes mellitus type 2 in nonobese (HCC)   Anemia   Chest pain   VT (ventricular tachycardia) (HCC)   Cardiac arrest (HCC)  Darrell [name] is a 65 y.o. with a pertinent PMH of CAD status post CABG, 2009 class, and PCI's, HFrEF, CKDIIIa, HTN, type 2 diabetes mellitus, HLD, who was admitted with NSTEMI with subsequent Vtach, PEA arrest with successful ROSC, now plans for heart cath pending improvement of renal function 2/2 ATN on hospital day 8.    Plan: #NSTEMI #V.  tach arrest #CAD status post CABG, PCI Patient did have 1 episode of fever overnight, 103.1 F overnight.  Patient states that he did not have any symptoms at that time.  Continuing on heparin drip and amiodarone.  Small improvement in creatinine from 3.19->3.14 today.  Platelets have been stable while on heparin, went from 189 yesterday to 199 today.  Carvedilol was increased to 6.25 mg twice daily. -Cardiac catheterization pending renal function -Continue heparin gtt, amiodarone -Daily CBC -Appreciate cardiology's recommendations -Replete potassium  #Febrile Patient had 1 episode of fever of 103.1 F overnight.  He denies chills, shortness of breath, coughing, dysuria, and any concerns. Will remove foley catheter that was in place due to urinary retention. Chest x-ray showed no signs concerning for infective process.   -U/A -Follow-up on urinary retention -Blood culture -Discontinued tylenol  #Anemia Patient was transfused 2 units of RBCs following his ventricular tachycardia arrest.  At that time his hemoglobin at 6.8. Following transfusion, Hgb increased to 10.1, but has continued to decrease each day.  Today hgb at 8.0.  Previous iron studies have showed iron deficiency anemia.  We will consider repeating iron studies tomorrow and possible iron transfusion versus additional RBC unit if hemoglobin continues to downtrend.   Best Practice: Diet: Cardiac diet IVF: Fluids: none VTE: Heparin per pharmacy Code: Full AB: none Therapy Recs: Home Health, DME: bedside commode Family Contact: updated son via telephone  DISPO: Anticipated discharge in 3-5 days to Home pending cardiac catheterization.  Signature: Rudene Christians, D.O. Internal Medicine Resident, PGY-1 [facility]  Pager: [phone] 6:14 AM, 10/23/2020   Please contact the on call pager after 5 pm and on weekends at [phone].

## 2020-10-24 DIAGNOSIS — I472 Ventricular tachycardia: Secondary | ICD-10-CM | POA: Diagnosis not present

## 2020-10-24 DIAGNOSIS — I214 Non-ST elevation (NSTEMI) myocardial infarction: Secondary | ICD-10-CM | POA: Diagnosis not present

## 2020-10-24 DIAGNOSIS — I469 Cardiac arrest, cause unspecified: Secondary | ICD-10-CM | POA: Diagnosis not present

## 2020-10-24 DIAGNOSIS — N179 Acute kidney failure, unspecified: Secondary | ICD-10-CM | POA: Diagnosis not present

## 2020-10-24 LAB — BASIC METABOLIC PANEL
Anion gap: 12 (ref 5–15)
BUN: 42 mg/dL — ABNORMAL HIGH (ref 8–23)
CO2: 18 mmol/L — ABNORMAL LOW (ref 22–32)
Calcium: 8.3 mg/dL — ABNORMAL LOW (ref 8.9–10.3)
Chloride: 102 mmol/L (ref 98–111)
Creatinine, Ser: 3.11 mg/dL — ABNORMAL HIGH (ref 0.61–1.24)
GFR, Estimated: 22 mL/min — ABNORMAL LOW (ref >60)
Glucose, Bld: 139 mg/dL — ABNORMAL HIGH (ref 70–99)
Potassium: 3.7 mmol/L (ref 3.5–5.1)
Sodium: 132 mmol/L — ABNORMAL LOW (ref 135–145)

## 2020-10-24 LAB — CBC
HCT: 24.8 % — ABNORMAL LOW (ref 39.0–52.0)
Hemoglobin: 7.4 g/dL — ABNORMAL LOW (ref 13.0–17.0)
MCH: 23 pg — ABNORMAL LOW (ref 26.0–34.0)
MCHC: 29.8 g/dL — ABNORMAL LOW (ref 30.0–36.0)
MCV: 77 fL — ABNORMAL LOW (ref 80.0–100.0)
Platelets: 200 10*3/uL (ref 150–400)
RBC: 3.22 MIL/uL — ABNORMAL LOW (ref 4.22–5.81)
RDW: 20 % — ABNORMAL HIGH (ref 11.5–15.5)
WBC: 16.1 10*3/uL — ABNORMAL HIGH (ref 4.0–10.5)
nRBC: 0 % (ref 0.0–0.2)

## 2020-10-24 LAB — HEMOGLOBIN AND HEMATOCRIT, BLOOD
HCT: 27.6 % — ABNORMAL LOW (ref 39.0–52.0)
Hemoglobin: 8.4 g/dL — ABNORMAL LOW (ref 13.0–17.0)

## 2020-10-24 LAB — PREPARE RBC (CROSSMATCH)

## 2020-10-24 LAB — GLUCOSE, CAPILLARY
Glucose-Capillary: 143 mg/dL — ABNORMAL HIGH (ref 70–99)
Glucose-Capillary: 163 mg/dL — ABNORMAL HIGH (ref 70–99)
Glucose-Capillary: 157 mg/dL — ABNORMAL HIGH (ref 70–99)
Glucose-Capillary: 131 mg/dL — ABNORMAL HIGH (ref 70–99)

## 2020-10-24 LAB — HEPARIN LEVEL (UNFRACTIONATED): Heparin Unfractionated: 0.31 IU/mL (ref 0.30–0.70)

## 2020-10-24 MED ORDER — SODIUM CHLORIDE 0.9 % IV SOLN
2.0000 g | INTRAVENOUS | Status: DC
  Administered 2020-10-24 – 2020-10-25 (×2): 2 g via INTRAVENOUS
  Filled 2020-10-24 (×3): qty 20

## 2020-10-24 MED ORDER — SODIUM CHLORIDE 0.9% IV SOLUTION: Freq: Once | INTRAVENOUS | Status: AC

## 2020-10-24 NOTE — Consult Note (Signed)
   [facility] Inpatient Consult   10/24/2020  Darrell [name] 1955-11-18 [id number]  [facility] [[facility]]  Accountable Care Organization [ACO] Patient Medicare CMS DCE  Primary Care Provider:  Hoy Register, MD [facility] and Wellness  LLOS: 9 days  Patient screened for hospitalization with noted extreme high risk score for unplanned readmission risk and to assess for potential restart of [facility]  [[facility]] Care Management services for post hospital transition.  Review of patient's medical record reveals patient is being recommended from PT/OT recommendations for home with home health if he has 24/7 coverage. Noted patient's cognitive deficits noted.  Plan:  Continue to follow progress and disposition to assess for post hospital care management needs.   Following with inpatient [facility] RNCM for ongoing needs.  For questions contact:   Charlesetta Shanks, RN BSN CCM [facility]  [phone] business mobile phone Toll free office [phone]  Fax number: [phone] [email or web address]

## 2020-10-24 NOTE — Progress Notes (Signed)
   Called for patient well-known to me from last week's hospitalization with atrial fibrillation.  Apparently he is on amiodarone infusion at this point at 60 mg an hour.  He has been having recent significant bradycardia with heart rates in the 30s and 40s.  I advised holding the amiodarone for 1 hour and then decreasing the dose to 30 mg/h.  Monitor heart rate.  Chrystie Nose, MD, [facility], FACP  [place]  [facility]  Medical Director of the Advanced Lipid Disorders &  Cardiovascular Risk Reduction Clinic Diplomate of the American Board of Clinical Lipidology Attending Cardiologist  Direct Dial: [phone]  Fax: [phone]  Website:  [email or web address]

## 2020-10-24 NOTE — Hospital Course (Signed)
Patient examined in bed lying in bed this morning. No headache, no chest pain, no dysuria , + bowel movement.

## 2020-10-24 NOTE — Progress Notes (Signed)
Occupational Therapy Treatment Patient Details Name: Darrell [name] MRN: [id number] DOB: Jun 23, 1955 Today's Date: 10/24/2020   History of present illness Darrell [name] is a 65 y.o. male who presents with complaints of chest pain. On 9/19, pt experienced cardiac arrest and intubated. Extubated on 9/20. Cardiology recommends heart catheterization in future after resolution of AKI. PMH: CHF, CABG (2009), NSTEMI (8/22), bedbug infestation, CKD, CAD, DM, HTN   OT comments  Pt with slower progress towards OT goals today due to reported fatigue and some self limiting traits. Pt overall Min A for sit to stand transfers during ADLs with cues needed for hand placement on RW. Pt requires Min A for UB ADLs today. Began education on UE HEP via theraband with pt completing 1/4 exercises and declined to attempt any further. Plan to progress standing tolerance during ADLs and reinforce UE HEP in next sessions. Continue to recommend SNF rehab as pt below functional baseline with safety concerns in ADL/mobility completion at this time.    Recommendations for follow up therapy are one component of a multi-disciplinary discharge planning process, led by the attending physician.  Recommendations may be updated based on patient status, additional functional criteria and insurance authorization.    Follow Up Recommendations  SNF    Equipment Recommendations  3 in 1 bedside commode;Tub/shower bench    Recommendations for Other Services      Precautions / Restrictions Precautions Precautions: Fall;Other (comment) Precaution Comments: incontinence Restrictions Weight Bearing Restrictions: No RLE Weight Bearing: Weight bearing as tolerated LLE Weight Bearing: Weight bearing as tolerated       Mobility Bed Mobility Overal bed mobility: Needs Assistance Bed Mobility: Supine to Sit     Supine to sit: HOB elevated;Min guard     General bed mobility comments: received in chair     Transfers Overall transfer level: Needs assistance Equipment used: Rolling walker (2 wheeled) Transfers: Sit to/from Stand Sit to Stand: Min assist         General transfer comment: Min A to to stand from recliner but noted to pull on RW rather than push up. With cues to reach back, pt able to follow through with this. Stood long enough to get gown untangled from around back/bottom    Balance Overall balance assessment: Needs assistance Sitting-balance support: No upper extremity supported;Feet supported Sitting balance-Leahy Scale: Fair     Standing balance support: During functional activity Standing balance-Leahy Scale: Poor Standing balance comment: reliant on BUE support in standing                           ADL either performed or assessed with clinical judgement   ADL Overall ADL's : Needs assistance/impaired                 Upper Body Dressing : Minimal assistance;Sitting Upper Body Dressing Details (indicate cue type and reason): to doff/don clean gown up in chair                   General ADL Comments: Session focused on UE HEP education and progression of ADLs though pt self limiting, citing fatigue. When OT brought RW out, pt said "I am not walking again"     Vision   Vision Assessment?: No apparent visual deficits   Perception     Praxis      Cognition Arousal/Alertness: Awake/alert Behavior During Therapy: Flat affect;Impulsive Overall Cognitive Status: No family/caregiver present to determine baseline cognitive functioning  General Comments: pt with poor safety awareness; slow processing, flat affect, sometimes does not answer questions or very vague        Exercises Exercises: General Upper Extremity General Exercises - Upper Extremity Shoulder Flexion: Strengthening;Both;10 reps;Seated;Theraband Theraband Level (Shoulder Flexion): Level 1 (Yellow)   Shoulder Instructions        General Comments SpO2 100% on RA, HR WFL    Pertinent Vitals/ Pain       Pain Assessment: No/denies pain  Home Living                                          Prior Functioning/Environment              Frequency  Min 2X/week        Progress Toward Goals  OT Goals(current goals can now be found in the care plan section)  Progress towards OT goals: Progressing toward goals  Acute Rehab OT Goals Patient Stated Goal: go home OT Goal Formulation: With patient Time For Goal Achievement: 11/02/20 Potential to Achieve Goals: Good ADL Goals Pt Will Perform Grooming: with min guard assist;standing Pt Will Perform Lower Body Bathing: with min assist;sit to/from stand Pt Will Transfer to Toilet: with supervision;stand pivot transfer;bedside commode Pt/caregiver will Perform Home Exercise Program: Increased strength;Both right and left upper extremity;With theraband;With Supervision;With written HEP provided Additional ADL Goal #1: Pt to increase activity tolerance to > 5 minutes during ADL tasks  Plan Discharge plan remains appropriate    Co-evaluation                 AM-PAC OT "6 Clicks" Daily Activity     Outcome Measure   Help from another person eating meals?: A Little Help from another person taking care of personal grooming?: A Little Help from another person toileting, which includes using toliet, bedpan, or urinal?: Total Help from another person bathing (including washing, rinsing, drying)?: A Lot Help from another person to put on and taking off regular upper body clothing?: A Little Help from another person to put on and taking off regular lower body clothing?: A Lot 6 Click Score: 14    End of Session Equipment Utilized During Treatment: Rolling walker  OT Visit Diagnosis: Unsteadiness on feet (R26.81);Other abnormalities of gait and mobility (R26.89);Muscle weakness (generalized) (M62.81)   Activity Tolerance Patient limited  by fatigue   Patient Left in chair;with call bell/phone within reach;with chair alarm set   Nurse Communication          Time: [phone] OT Time Calculation (min): 18 min  Charges: OT General Charges $OT Visit: 1 Visit OT Treatments $Therapeutic Activity: 8-22 mins  Bradd Canary, OTR/L Acute Rehab Services Office: [phone]   Lorre Munroe 10/24/2020, 12:59 PM

## 2020-10-24 NOTE — Progress Notes (Signed)
HD#9 SUBJECTIVE:  Patient Summary: Darrell [name] is a 65 y.o. with a pertinent PMH of CAD status post CABG (2009) and PCI's, chronic systolic heart failure, CKD 3A, hypertension, type 2 diabetes, HLD admitted September 18 with NSTEMI with subsequent V. tach/PEA arrest with successful ROSC, no plans for heart cath pending improvement of renal function secondary to acute tubular necrosis.  Overnight Events: Patient evaluated at bedside this AM.  Has no current concerns, denies dyspnea, chest pain, headache, dysuria, nausea, vomiting, and diarrhea.   OBJECTIVE:  Vital Signs: Vitals:   10/23/20 1800 10/23/20 1943 10/23/20 2332 10/24/20 0401  BP:  (!) 144/78 129/62 134/84  Pulse:  68 68 64  Resp:  18 20 20   Temp: 98.8 F (37.1 C) 98.1 F (36.7 C) 98.4 F (36.9 C) 99.3 F (37.4 C)  TempSrc:  Oral Oral Oral  SpO2:  98% 93% 95%  Weight:    105.7 kg  Height:       Supplemental O2: Nasal Cannula SpO2: 95 % O2 Flow Rate (L/min): 2 L/min FiO2 (%): 30 %  Filed Weights   10/15/20 0456 10/15/20 1700 10/24/20 0401  Weight: 106.6 kg 98.2 kg 105.7 kg     Intake/Output Summary (Last 24 hours) at 10/24/2020 0611 Last data filed at 10/24/2020 0423 Gross per 24 hour  Intake 2319.61 ml  Output 1221 ml  Net 1098.61 ml   Net IO Since Admission: 1,853.83 mL [10/24/20 0611]  Physical Exam: General: Well-developed, well-nourished, sitting comfortably in bed, no acute distress HENT: NCAT Eyes: No scleral icterus, conjunctiva clear CV: No murmurs, regular rate and rhythm Pulm: Clear to auscultation bilaterally, normal pulmonary effort GI: No tenderness, bowel sounds present MSK: No pitting edema in lower extremities bilaterally, normal bulk and tone Skin: Warm and dry Psych: Normal mood and affect  Patient Lines/Drains/Airways Status     Active Line/Drains/Airways     Name Placement date Placement time Site Days   CVC Triple Lumen 10/16/20 Left Subclavian 10/16/20  1039  -- 8    External Urinary Catheter 10/23/20  1128  --  1            Pertinent Labs: CBC Latest Ref Rng & Units 10/24/2020 10/23/2020 10/22/2020  WBC 4.0 - 10.5 K/uL 16.1(H) 9.9 6.6  Hemoglobin 13.0 - 17.0 g/dL 7.4(L) 8.0(L) 8.3(L)  Hematocrit 39.0 - 52.0 % 24.8(L) 26.2(L) 28.2(L)  Platelets 150 - 400 K/uL 200 199 189    CMP Latest Ref Rng & Units 10/24/2020 10/23/2020 10/22/2020  Glucose 70 - 99 mg/dL 10/24/2020) 537(S) 827(M)  BUN 8 - 23 mg/dL 786(L) 54(G) 92(E)  Creatinine 0.61 - 1.24 mg/dL 10(O) 7.12(R) 9.75(O)  Sodium 135 - 145 mmol/L 132(L) 134(L) 135  Potassium 3.5 - 5.1 mmol/L 3.7 3.8 3.9  Chloride 98 - 111 mmol/L 102 104 104  CO2 22 - 32 mmol/L 18(L) 19(L) 21(L)  Calcium 8.9 - 10.3 mg/dL 8.3(L) 8.2(L) 8.3(L)  Total Protein 6.5 - 8.1 g/dL - - -  Total Bilirubin 0.3 - 1.2 mg/dL - - -  Alkaline Phos 38 - 126 U/L - - -  AST 15 - 41 U/L - - -  ALT 0 - 44 U/L - - -    Recent Labs    10/23/20 1140 10/23/20 1602 10/23/20 2103  GLUCAP 148* 137* 161*     Pertinent Imaging: DG CHEST PORT 1 VIEW  Result Date: 10/23/2020 CLINICAL DATA:  Fever. EXAM: PORTABLE CHEST 1 VIEW COMPARISON:  One-view chest x-ray 10/16/2020 FINDINGS:  The heart is enlarged. No edema or effusion is present. No focal airspace disease is present. Patient is status post median sternotomy. A left subclavian line is in place. IMPRESSION: No active disease. Electronically Signed   By: Marin Roberts M.D.   On: 10/23/2020 08:23    ASSESSMENT/PLAN:  Assessment: Principal Problem:   NSTEMI (non-ST elevated myocardial infarction) [facility]) Active Problems:   Type 2 diabetes mellitus without complication, without long-term current use of insulin (HCC)   Adjustment disorder with depressed mood   Coronary atherosclerosis, s/p CABG in 2009    History of noncompliance with medical treatment, presenting hazards to health   AKI (acute kidney injury) (HCC)   Diabetes mellitus type 2 in nonobese (HCC)   Anemia   Chest pain    VT (ventricular tachycardia) (HCC)   Cardiac arrest (HCC)  Darrell [name] is a 65 y.o. with a pertinent PMH of CAD status post CABG (2009) and PCI's, chronic systolic heart failure, CKD 3A, hypertension, type 2 diabetes, HLD admitted September 18 with NSTEMI with subsequent V. tach/PEA arrest with successful ROSC, no plans for heart cath pending improvement of renal function secondary to acute tubular necrosis.  Plan: #NSTEMI#V. tach arrest#AKI on chronic kidney disease stage III Creatinine improved to 3.11 from 3.14 today.  Ventricular ectopy improved on IV amiodarone.  She remains amenable to cardiac catheterization once creatinine improves.  -Continue heparin gtt -Continue IV amiodarone per cardiology -Avoid nephrotoxins -Continue carvedilol 6.25 mg twice daily  #Anemia Patient's hemoglobin decreased from 8.0-7.4 today.  It is gradually been decreasing over the last several days.  We will transfuse 1 unit of red blood cells today.  Platelets have been stable. -repeat H&H following transfusion -daily CBC  #Leukocytosis White blood cells increased from 9.9-16.1 today. patient had 1 episode of fever of 103.1 F 2 nights ago.  He denies chills, fevers, shortness of breath, headache, cough, dysuria, nausea, vomiting, and diarrhea.  UA showed negative nitrites and positive leukocytes.  Urine and blood culture pending, no growth to date.  Chest x-ray not show any signs concerning for infection. -Follow-up on culture and urine culture -Follow CBC  Best Practice: Diet: Cardiac diet IVF: Fluids: none VTE: on heparin Code: Full AB: none Therapy Recs: Home Health, DME: bedside commode Family Contact: updated son yesterday DISPO: Anticipated discharge in 3-5 days   Signature: [employer], D.O. Internal Medicine Resident, PGY-1 [facility]  Pager: [phone] 6:11 AM, 10/24/2020   Please contact the on call pager after 5 pm and on weekends at  [phone].

## 2020-10-24 NOTE — Progress Notes (Signed)
Physical Therapy Treatment Patient Details Name: Darrell [name] MRN: [id number] DOB: Oct 10, 1955 Today's Date: 10/24/2020   History of Present Illness Darrell [name] is a 65 y.o. male who presents with complaints of chest pain. On 9/19, pt experienced cardiac arrest and intubated. Extubated on 9/20. Cardiology recommends heart catheterization in future after resolution of AKI. PMH: CHF, CABG (2009), NSTEMI (8/22), bedbug infestation, CKD, CAD, DM, HTN    PT Comments    Patient progressing slowly towards PT goals. Tolerated short distance gait training with Min A for balance/safety and use of RW for support. Fatigues quickly needing seated rest break after 8' and again after 4'. Noted to have marked weakness in BLEs. Also with cognitive deficits related to safety, awareness, problem solving and memory. Pt will need supervision if discharges home. If pt does not progress, may need to consider SNF however pt wanting to return home. Will follow acutely.    Recommendations for follow up therapy are one component of a multi-disciplinary discharge planning process, led by the attending physician.  Recommendations may be updated based on patient status, additional functional criteria and insurance authorization.  Follow Up Recommendations  Home health PT;Supervision/Assistance - 24 hour (pending improvement, may need SNF)     Equipment Recommendations  3in1 (PT)    Recommendations for Other Services       Precautions / Restrictions Precautions Precautions: Fall;Other (comment) Precaution Comments: incontinence Restrictions Weight Bearing Restrictions: Yes RLE Weight Bearing: Weight bearing as tolerated LLE Weight Bearing: Weight bearing as tolerated     Mobility  Bed Mobility Overal bed mobility: Needs Assistance Bed Mobility: Supine to Sit     Supine to sit: HOB elevated;Min guard     General bed mobility comments: Increased time to get to EOB with use of rail.     Transfers Overall transfer level: Needs assistance Equipment used: Rolling walker (2 wheeled) Transfers: Sit to/from Stand Sit to Stand: Mod assist         General transfer comment: Assist to power to standing with cues for hand placement/technique; stood from EOB x1, from chair x1, transferred to chair post ambulation.  Ambulation/Gait Ambulation/Gait assistance: Min assist Gait Distance (Feet): 8 Feet (+ 4') Assistive device: Rolling walker (2 wheeled) Gait Pattern/deviations: Step-through pattern;Decreased stride length;Trunk flexed Gait velocity: decreased   General Gait Details: Slow, unsteady gait with varying speeds and flexed posture; needs to sit quickly due to LE weakness, leaning on forearm of walker. Fatigues.   Stairs             Wheelchair Mobility    Modified Rankin (Stroke Patients Only)       Balance Overall balance assessment: Needs assistance Sitting-balance support: No upper extremity supported;Feet supported Sitting balance-Leahy Scale: Fair     Standing balance support: During functional activity Standing balance-Leahy Scale: Poor Standing balance comment: requiring minA to maintain balance and use of BUE                            Cognition Arousal/Alertness: Awake/alert Behavior During Therapy: Flat affect;Impulsive Overall Cognitive Status: No family/caregiver present to determine baseline cognitive functioning                                 General Comments: pt with poor safety awareness; slow processing, Answers year when asked month. Reports he has been up walking all night but then asked again  and has not been walking. inconsistent with responses.  very flat.      Exercises General Exercises - Lower Extremity Quad Sets: AROM;Both;10 reps;Seated Straight Leg Raises: AROM;Both;5 reps;Seated Hip Flexion/Marching: AROM;Both;5 reps;Seated    General Comments General comments (skin integrity, edema,  etc.): VSS onRA      Pertinent Vitals/Pain Pain Assessment: No/denies pain    Home Living                      Prior Function            PT Goals (current goals can now be found in the care plan section) Progress towards PT goals: Progressing toward goals    Frequency    Min 3X/week      PT Plan Current plan remains appropriate    Co-evaluation              AM-PAC PT "6 Clicks" Mobility   Outcome Measure  Help needed turning from your back to your side while in a flat bed without using bedrails?: A Little Help needed moving from lying on your back to sitting on the side of a flat bed without using bedrails?: A Little Help needed moving to and from a bed to a chair (including a wheelchair)?: A Lot Help needed standing up from a chair using your arms (e.g., wheelchair or bedside chair)?: A Lot Help needed to walk in hospital room?: A Little Help needed climbing 3-5 steps with a railing? : A Lot 6 Click Score: 15    End of Session Equipment Utilized During Treatment: Gait belt Activity Tolerance: Patient limited by fatigue Patient left: in chair;with call bell/phone within reach;with chair alarm set Nurse Communication: Mobility status PT Visit Diagnosis: Other abnormalities of gait and mobility (R26.89);Unsteadiness on feet (R26.81);Muscle weakness (generalized) (M62.81)     Time: 1030-1045 PT Time Calculation (min) (ACUTE ONLY): 15 min  Charges:  $Therapeutic Activity: 8-22 mins                     Vale Haven, PT, DPT Acute Rehabilitation Services Pager [phone] Office [phone]      Blake Divine A Lanier Ensign 10/24/2020, 12:40 PM

## 2020-10-24 NOTE — Progress Notes (Signed)
Progress Note  Patient Name: Darrell [name] Date of Encounter: 10/24/2020  [facility] Cardiologist: Minus Breeding, MD   Subjective   No acute events overnight. Ventricular ectopy much improved on IV amiodarone. We discussed future cath once renal function improved, and he is amenable. No chest pain, breathing stable.   Inpatient Medications    Scheduled Meds:  sodium chloride   Intravenous Once   amLODipine  5 mg Oral Daily   aspirin EC  81 mg Oral Daily   atorvastatin  80 mg Oral QPM   carvedilol  6.25 mg Oral BID WC   chlorhexidine gluconate (MEDLINE KIT)  15 mL Mouth Rinse BID   Chlorhexidine Gluconate Cloth  6 each Topical Daily   insulin aspart  0-15 Units Subcutaneous TID WC   pantoprazole  40 mg Oral Daily   sodium chloride flush  10-40 mL Intracatheter Q12H   Continuous Infusions:  amiodarone 60 mg/hr (10/24/20 0423)   heparin 1,550 Units/hr (10/23/20 2339)   PRN Meds: albuterol, alum & mag hydroxide-simeth, nitroGLYCERIN, ondansetron (ZOFRAN) IV, sodium chloride flush   Vital Signs    Vitals:   10/23/20 1943 10/23/20 2332 10/24/20 0401 10/24/20 0801  BP: (!) 144/78 129/62 134/84 (!) 123/55  Pulse: 68 68 64 60  Resp: $Remo'18 20 20 19  'AmxGs$ Temp: 98.1 F (36.7 C) 98.4 F (36.9 C) 99.3 F (37.4 C) 98.1 F (36.7 C)  TempSrc: Oral Oral Oral Oral  SpO2: 98% 93% 95% 91%  Weight:   105.7 kg   Height:        Intake/Output Summary (Last 24 hours) at 10/24/2020 1132 Last data filed at 10/24/2020 0600 Gross per 24 hour  Intake 2366.11 ml  Output 420 ml  Net 1946.11 ml   Last 3 Weights 10/24/2020 10/15/2020 10/15/2020  Weight (lbs) 233 lb 0.4 oz 216 lb 7.9 oz 235 lb  Weight (kg) 105.7 kg 98.2 kg 106.595 kg      Telemetry    NSR with intermittent rare PVCs, no significant ventricular ectopy. - Personally Reviewed  ECG    No new since 9/18 - Personally Reviewed  Physical Exam   GEN: No acute distress.   Neck: No JVD Cardiac: RRR, no murmurs, rubs, or  gallops.  Respiratory: Clear to auscultation bilaterally. GI: Soft, nontender, non-distended  MS: No edema; No deformity. Neuro:  Nonfocal  Psych: Normal affect   Labs    High Sensitivity Troponin:   Recent Labs  Lab 10/15/20 0502 10/15/20 0750 10/16/20 0953 10/16/20 1143  TROPONINIHS 685* 902* 6,892* 6,596*     Chemistry Recent Labs  Lab 10/17/20 2246 10/18/20 0517 10/19/20 0530 10/20/20 0450 10/21/20 0320 10/22/20 0500 10/23/20 0429 10/24/20 0500  NA 138   < > 136 134*   < > 135 134* 132*  K 4.1   < > 3.1* 4.0   < > 3.9 3.8 3.7  CL 106   < > 102 103   < > 104 104 102  CO2 23   < > 22 19*   < > 21* 19* 18*  GLUCOSE 99   < > 118* 99   < > 119* 123* 139*  BUN 33*   < > 38* 36*   < > 37* 38* 42*  CREATININE 3.33*   < > 3.72* 3.89*   < > 3.19* 3.14* 3.11*  CALCIUM 8.3*   < > 8.3* 8.1*   < > 8.3* 8.2* 8.3*  MG 2.5*  --  2.1 2.1  --   --   --   --  GFRNONAA 20*   < > 17* 16*   < > 21* 21* 22*  ANIONGAP 9   < > 12 12   < > $R'10 11 12   'Ab$ < > = values in this interval not displayed.    Lipids No results for input(s): CHOL, TRIG, HDL, LABVLDL, LDLCALC, CHOLHDL in the last 168 hours.  Hematology Recent Labs  Lab 10/22/20 0500 10/23/20 0429 10/24/20 0500  WBC 6.6 9.9 16.1*  RBC 3.60* 3.44* 3.22*  HGB 8.3* 8.0* 7.4*  HCT 28.2* 26.2* 24.8*  MCV 78.3* 76.2* 77.0*  MCH 23.1* 23.3* 23.0*  MCHC 29.4* 30.5 29.8*  RDW 19.2* 19.6* 20.0*  PLT 189 199 200   Thyroid No results for input(s): TSH, FREET4 in the last 168 hours.  BNPNo results for input(s): BNP, PROBNP in the last 168 hours.  DDimer No results for input(s): DDIMER in the last 168 hours.   Radiology    DG CHEST PORT 1 VIEW  Result Date: 10/23/2020 CLINICAL DATA:  Fever. EXAM: PORTABLE CHEST 1 VIEW COMPARISON:  One-view chest x-ray 10/16/2020 FINDINGS: The heart is enlarged. No edema or effusion is present. No focal airspace disease is present. Patient is status post median sternotomy. A left subclavian line is in  place. IMPRESSION: No active disease. Electronically Signed   By: San Morelle M.D.   On: 10/23/2020 08:23    Cardiac Studies   CARDIAC CATH: 05/04/2018 Mid RCA lesion is 100% stenosed. Ost RCA to Prox RCA lesion is 99% stenosed. Ost LAD to Prox LAD lesion is 100% stenosed. Mid Cx to Dist Cx lesion is 100% stenosed. Origin to Prox Graft lesion is 100% stenosed. Mid LAD to Dist LAD lesion is 90% stenosed with 100% stenosed side branch in Ost 3rd Sept. Origin to Prox Graft lesion is 100% stenosed. Ost Cx to Prox Cx lesion is 99% stenosed. Scoring balloon angioplasty was performed using a BALLOON WOLVERINE 2.75X10. Post intervention, there is a 30% residual stenosis. The left ventricular ejection fraction is 50-55% by visual estimate. LV end diastolic pressure is normal. IMPRESSION: Mr. Retter had anatomy fairly similar to what it was in 2017.  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  The mid to distal AV groove circumflex stent was occluded at this point whereas 3 years ago it was subtotally occluded.  His distal LAD beyond LIMA insertion was diffusely and severely diseased as well.     I performed Cutting Balloon atherectomy reducing a 99% lesion to less than 30%.  Patient tolerated procedure well.  He was already on aspirin Plavix.  Angiomax was turned off.  He left the lab in stable condition on IV nitroglycerin for blood pressure control.  The sheath will be removed and pressure held.  Patient will be gently hydrated and most likely discharged home tomorrow on aspirin and Plavix.  Intervention    Patient Profile     65 y.o. male with PMH CAD s/p CABG and prior PCI, hypertension, hyperlipidemia, type II diabetes, chronic kidney disease who presented with chest pain, then had VT arrest 10/16/20.  Assessment & Plan    NSTEMI/VT arrest CAD s/p CABG 2009 w/ subsequent PCIs -high suspicion for ischemia as cause  of VT arrest -we discussed cath once Cr improves. He is amenable. 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

## 2020-10-24 NOTE — Progress Notes (Signed)
ANTICOAGULATION CONSULT NOTE - Follow-Up Consult  Pharmacy Consult for IV Heparin Indication: chest pain/ACS  No Known Allergies  Patient Measurements: Height: 6\' 1"  (185.4 cm) Weight: 105.7 kg (233 lb 0.4 oz) IBW/kg (Calculated) : 79.9 Heparin Dosing Weight: 98 kg   Vital Signs: Temp: 98.1 F (36.7 C) (09/27 0801) Temp Source: Oral (09/27 0801) BP: 123/55 (09/27 0801) Pulse Rate: 60 (09/27 0801)  Labs: Recent Labs    10/22/20 0500 10/23/20 0429 10/24/20 0500  HGB 8.3* 8.0* 7.4*  HCT 28.2* 26.2* 24.8*  PLT 189 199 200  HEPARINUNFRC 0.42 0.37 0.31  CREATININE 3.19* 3.14* 3.11*    Estimated Creatinine Clearance: 30.6 mL/min (A) (by C-G formula based on SCr of 3.11 mg/dL (H)).  Assessment: 65 yr old man with recent NSTEMI (declined cardiac cath at that time) presented with CP, elevated troponins. Pt was on heparin for planned 48 hr due to refusal of cath, now S/P VF arrest; heparin to resume pending possible change in cath plans. Awaiting cath (pt now agreeable), but Scr still elevated (trend down)  Heparin level is therapeutic at 0.31 on 1550 units/hr. CBC stable.  Goal of Therapy:  Heparin level 0.3-0.5 units/ml (no boluses) Monitor platelets by anticoagulation protocol: Yes   Plan:  Cont heparin infusion 1550 units/hr Monitor daily heparin level, CBC F/U cath plans  78, PharmD Clinical Pharmacist **Pharmacist phone directory can now be found on [email or web address] ([email or web address]).  Listed under [facility].

## 2020-10-25 DIAGNOSIS — I469 Cardiac arrest, cause unspecified: Secondary | ICD-10-CM | POA: Diagnosis not present

## 2020-10-25 DIAGNOSIS — I472 Ventricular tachycardia: Secondary | ICD-10-CM | POA: Diagnosis not present

## 2020-10-25 DIAGNOSIS — I214 Non-ST elevation (NSTEMI) myocardial infarction: Secondary | ICD-10-CM | POA: Diagnosis not present

## 2020-10-25 DIAGNOSIS — N179 Acute kidney failure, unspecified: Secondary | ICD-10-CM | POA: Diagnosis not present

## 2020-10-25 LAB — CBC
HCT: 27.2 % — ABNORMAL LOW (ref 39.0–52.0)
Hemoglobin: 8.1 g/dL — ABNORMAL LOW (ref 13.0–17.0)
MCH: 23.4 pg — ABNORMAL LOW (ref 26.0–34.0)
MCHC: 29.8 g/dL — ABNORMAL LOW (ref 30.0–36.0)
MCV: 78.6 fL — ABNORMAL LOW (ref 80.0–100.0)
Platelets: 189 10*3/uL (ref 150–400)
RBC: 3.46 MIL/uL — ABNORMAL LOW (ref 4.22–5.81)
RDW: 19.8 % — ABNORMAL HIGH (ref 11.5–15.5)
WBC: 8.7 10*3/uL (ref 4.0–10.5)
nRBC: 0 % (ref 0.0–0.2)

## 2020-10-25 LAB — URINE CULTURE: Culture: >=100000 — AB

## 2020-10-25 LAB — HEPARIN LEVEL (UNFRACTIONATED)
Heparin Unfractionated: 0.21 IU/mL — ABNORMAL LOW (ref 0.30–0.70)
Heparin Unfractionated: 0.19 IU/mL — ABNORMAL LOW (ref 0.30–0.70)
Heparin Unfractionated: 0.29 IU/mL — ABNORMAL LOW (ref 0.30–0.70)

## 2020-10-25 LAB — BASIC METABOLIC PANEL
Anion gap: 12 (ref 5–15)
BUN: 44 mg/dL — ABNORMAL HIGH (ref 8–23)
CO2: 18 mmol/L — ABNORMAL LOW (ref 22–32)
Calcium: 8 mg/dL — ABNORMAL LOW (ref 8.9–10.3)
Chloride: 101 mmol/L (ref 98–111)
Creatinine, Ser: 3.07 mg/dL — ABNORMAL HIGH (ref 0.61–1.24)
GFR, Estimated: 22 mL/min — ABNORMAL LOW (ref >60)
Glucose, Bld: 125 mg/dL — ABNORMAL HIGH (ref 70–99)
Potassium: 3.4 mmol/L — ABNORMAL LOW (ref 3.5–5.1)
Sodium: 131 mmol/L — ABNORMAL LOW (ref 135–145)

## 2020-10-25 LAB — GLUCOSE, CAPILLARY
Glucose-Capillary: 142 mg/dL — ABNORMAL HIGH (ref 70–99)
Glucose-Capillary: 124 mg/dL — ABNORMAL HIGH (ref 70–99)
Glucose-Capillary: 140 mg/dL — ABNORMAL HIGH (ref 70–99)
Glucose-Capillary: 156 mg/dL — ABNORMAL HIGH (ref 70–99)
Glucose-Capillary: 175 mg/dL — ABNORMAL HIGH (ref 70–99)

## 2020-10-25 LAB — TYPE AND SCREEN
ABO/RH(D): O NEG
Antibody Screen: NEGATIVE
Unit division: 0

## 2020-10-25 LAB — BPAM RBC
Blood Product Expiration Date: 202209272359
ISSUE DATE / TIME: 202209271316
Unit Type and Rh: 9500

## 2020-10-25 LAB — MAGNESIUM: Magnesium: 1.6 mg/dL — ABNORMAL LOW (ref 1.7–2.4)

## 2020-10-25 MED ORDER — POTASSIUM CHLORIDE CRYS ER 20 MEQ PO TBCR
30.0000 meq | EXTENDED_RELEASE_TABLET | Freq: Once | ORAL | Status: AC
  Administered 2020-10-25: 30 meq via ORAL
  Filled 2020-10-25: qty 1

## 2020-10-25 MED ORDER — POTASSIUM CHLORIDE 20 MEQ PO PACK
40.0000 meq | PACK | Freq: Once | ORAL | Status: AC
  Administered 2020-10-25: 40 meq via ORAL
  Filled 2020-10-25: qty 2

## 2020-10-25 NOTE — Plan of Care (Signed)
  Problem: Clinical Measurements: Goal: Ability to maintain clinical measurements within normal limits will improve Outcome: Progressing Goal: Will remain free from infection Outcome: Progressing   Problem: Pain Managment: Goal: General experience of comfort will improve Outcome: Progressing   

## 2020-10-25 NOTE — Progress Notes (Signed)
ANTICOAGULATION CONSULT NOTE - Consult  Pharmacy Consult for IV Heparin Indication: chest pain/ACS  No Known Allergies  Patient Measurements: Height: _0  (185.4 cm) Weight: 106 kg (233 lb 11 oz) IBW/kg (Calculated) : 79.9  Heparin Dosing Weight: 98 kg  Vital Signs: Temp: 97.7 F (36.5 C) (09/28 2010) Temp Source: Oral (09/28 2010) BP: 114/56 (09/28 2010) Pulse Rate: 57 (09/28 2010)  Labs: Recent Labs    10/23/20 0429 10/24/20 0500 10/24/20 1830 10/25/20 0500 10/25/20 1800 10/25/20 1925  HGB 8.0* 7.4* 8.4* 8.1*  --   --   HCT 26.2* 24.8* 27.6* 27.2*  --   --   PLT 199 200  --  189  --   --   HEPARINUNFRC 0.37 0.31  --  0.21* 0.19* 0.29*  CREATININE 3.14* 3.11*  --  3.07*  --   --      Estimated Creatinine Clearance: 31 mL/min (A) (by C-G formula based on SCr of 3.07 mg/dL (H)).   Medications:  Scheduled:   amLODipine  5 mg Oral Daily   aspirin EC  81 mg Oral Daily   atorvastatin  80 mg Oral QPM   carvedilol  6.25 mg Oral BID WC   chlorhexidine gluconate (MEDLINE KIT)  15 mL Mouth Rinse BID   Chlorhexidine Gluconate Cloth  6 each Topical Daily   insulin aspart  0-15 Units Subcutaneous TID WC   pantoprazole  40 mg Oral Daily   sodium chloride flush  10-40 mL Intracatheter Q12H   Infusions:   amiodarone 30 mg/hr (10/25/20 2018)   cefTRIAXone (ROCEPHIN)  IV 2 g (10/25/20 1747)   heparin 1,700 Units/hr (10/25/20 1600)    Assessment: 65 yr old man with recent NSTEMI (declined cardiac cath at that time) presented with CP, elevated troponins. Pt was on heparin for planned 48 hr due to refusal of cath, now S/P VF arrest; heparin continued while pending possible change in cath plans. Awaiting cath (pt now agreeable), but Scr still elevated (trending down: 3.89 > 3.07)   Heparin level 0.21 (on 1700 units/hr) Discussed with nurse and had lab redraw Repeat level 0.29 (on 1700 units/hr)  Goal of Therapy:  Heparin level 0.3-0.7 units/ml (no boluses) Monitor  platelets by anticoagulation protocol: Yes   Plan:  Increase heparin infusion to 1800 units/hr Check daily while on heparin Continue to monitor H&H and platelets   Donnald Garre, PharmD Clinical Pharmacist  Please check AMION for all [place] numbers After 10:00 PM, call [place] [phone]

## 2020-10-25 NOTE — Progress Notes (Addendum)
ANTICOAGULATION CONSULT NOTE - Consult  Pharmacy Consult for IV Heparin Indication: chest pain/ACS  No Known Allergies  Patient Measurements: Height: 6\' 1"  (185.4 cm) Weight: 106 kg (233 lb 11 oz) IBW/kg (Calculated) : 79.9  Heparin Dosing Weight: 98 kg  Vital Signs: Temp: 98.6 F (37 C) (09/28 0825) Temp Source: Oral (09/28 0825) BP: 148/78 (09/28 0825) Pulse Rate: 65 (09/28 0825)  Labs: Recent Labs    10/23/20 0429 10/24/20 0500 10/24/20 1830 10/25/20 0500  HGB 8.0* 7.4* 8.4* 8.1*  HCT 26.2* 24.8* 27.6* 27.2*  PLT 199 200  --  189  HEPARINUNFRC 0.37 0.31  --  0.21*  CREATININE 3.14* 3.11*  --  3.07*    Estimated Creatinine Clearance: 31 mL/min (A) (by C-G formula based on SCr of 3.07 mg/dL (H)).   Medications:  Scheduled:   amLODipine  5 mg Oral Daily   aspirin EC  81 mg Oral Daily   atorvastatin  80 mg Oral QPM   carvedilol  6.25 mg Oral BID WC   chlorhexidine gluconate (MEDLINE KIT)  15 mL Mouth Rinse BID   Chlorhexidine Gluconate Cloth  6 each Topical Daily   insulin aspart  0-15 Units Subcutaneous TID WC   pantoprazole  40 mg Oral Daily   sodium chloride flush  10-40 mL Intracatheter Q12H   Infusions:   amiodarone 30 mg/hr (10/25/20 0546)   cefTRIAXone (ROCEPHIN)  IV Stopped (10/24/20 1717)   heparin 1,550 Units/hr (10/25/20 0022)    Assessment: 65 yr old man with recent NSTEMI (declined cardiac cath at that time) presented with CP, elevated troponins. Pt was on heparin for planned 48 hr due to refusal of cath, now S/P VF arrest; heparin continued while pending possible change in cath plans. Awaiting cath (pt now agreeable), but Scr still elevated (trending down: 3.89 > 3.07)   Heparin level continues to trend down (0.37 > 0.31) and is now subtherapeutic at 0.21 on 1550 units/hr. CBC stable. Will increase heparin rate, no bolus.  Goal of Therapy:  Heparin level 0.3-0.5 units/ml (no boluses) Monitor platelets by anticoagulation protocol: Yes    Plan:  Increase heparin infusion to 1700 units/hr Check heparin level in 8 hours and daily while on heparin Continue to monitor H&H and platelets   Thank you for allowing pharmacy to be a part of this patient's care.  Ardyth Harps, PharmD Clinical Pharmacist

## 2020-10-25 NOTE — Progress Notes (Signed)
Progress Note  Patient Name: Darrell [name] Date of Encounter: 10/25/2020  [facility] Cardiologist: [name], MD   Subjective   Had intermittent bradycardia yesterday PM per report, IV amiodarone rate decreased. On my review of telemetry, I do not see significant bradycardia. There is intermittent ventricular ectopy but no NSVT or VT noted. He has no new concerns today.   Inpatient Medications    Scheduled Meds:  amLODipine  5 mg Oral Daily   aspirin EC  81 mg Oral Daily   atorvastatin  80 mg Oral QPM   carvedilol  6.25 mg Oral BID WC   chlorhexidine gluconate (MEDLINE KIT)  15 mL Mouth Rinse BID   Chlorhexidine Gluconate Cloth  6 each Topical Daily   insulin aspart  0-15 Units Subcutaneous TID WC   pantoprazole  40 mg Oral Daily   sodium chloride flush  10-40 mL Intracatheter Q12H   Continuous Infusions:  amiodarone 30 mg/hr (10/25/20 0546)   cefTRIAXone (ROCEPHIN)  IV Stopped (10/24/20 1717)   heparin 1,550 Units/hr (10/25/20 0022)   PRN Meds: albuterol, alum & mag hydroxide-simeth, nitroGLYCERIN, ondansetron (ZOFRAN) IV, sodium chloride flush   Vital Signs    Vitals:   10/24/20 1941 10/24/20 2313 10/25/20 0322 10/25/20 0825  BP: (!) 94/56 (!) 117/58 131/76 (!) 148/78  Pulse: (!) 52 (!) 55 63 65  Resp: 17   18  Temp: 97.7 F (36.5 C) 97.6 F (36.4 C) 97.6 F (36.4 C) 98.6 F (37 C)  TempSrc: Oral Oral Oral Oral  SpO2: 99% 98% 97% 99%  Weight:   106 kg   Height:        Intake/Output Summary (Last 24 hours) at 10/25/2020 0917 Last data filed at 10/25/2020 0914 Gross per 24 hour  Intake 1346.72 ml  Output 2351 ml  Net -1004.28 ml   Last 3 Weights 10/25/2020 10/24/2020 10/15/2020  Weight (lbs) 233 lb 11 oz 233 lb 0.4 oz 216 lb 7.9 oz  Weight (kg) 106 kg 105.7 kg 98.2 kg      Telemetry    In last 24 hours, rate largely stable. No significant bradycardia or pauses that I see. Sinus rhythm with intermittent ventricular ectopy, occasional  ventricular trigeminy- Personally Reviewed  ECG    No new since 9/18 - Personally Reviewed  Physical Exam   GEN: Well nourished, well developed in no acute distress NECK: No JVD CARDIAC: regular rhythm, normal S1 and S2, no rubs or gallops. No murmur. VASCULAR: Radial pulses 2+ bilaterally.  RESPIRATORY:  Clear to auscultation without rales, wheezing or rhonchi  ABDOMEN: Soft, non-tender, non-distended MUSCULOSKELETAL:  Moves all 4 limbs independently SKIN: Warm and dry, no edema NEUROLOGIC:  No focal neuro deficits noted. PSYCHIATRIC:  Normal affect    Labs    High Sensitivity Troponin:   Recent Labs  Lab 10/15/20 0502 10/15/20 0750 10/16/20 0953 10/16/20 1143  TROPONINIHS 685* 902* 6,892* 6,596*     Chemistry Recent Labs  Lab 10/19/20 0530 10/20/20 0450 10/21/20 0320 10/23/20 0429 10/24/20 0500 10/25/20 0500  NA 136 134*   < > 134* 132* 131*  K 3.1* 4.0   < > 3.8 3.7 3.4*  CL 102 103   < > 104 102 101  CO2 22 19*   < > 19* 18* 18*  GLUCOSE 118* 99   < > 123* 139* 125*  BUN 38* 36*   < > 38* 42* 44*  CREATININE 3.72* 3.89*   < > 3.14* 3.11* 3.07*  CALCIUM  8.3* 8.1*   < > 8.2* 8.3* 8.0*  MG 2.1 2.1  --   --   --   --   GFRNONAA 17* 16*   < > 21* 22* 22*  ANIONGAP 12 12   < > $R'11 12 12   'XW$ < > = values in this interval not displayed.    Lipids No results for input(s): CHOL, TRIG, HDL, LABVLDL, LDLCALC, CHOLHDL in the last 168 hours.  Hematology Recent Labs  Lab 10/23/20 0429 10/24/20 0500 10/24/20 1830 10/25/20 0500  WBC 9.9 16.1*  --  8.7  RBC 3.44* 3.22*  --  3.46*  HGB 8.0* 7.4* 8.4* 8.1*  HCT 26.2* 24.8* 27.6* 27.2*  MCV 76.2* 77.0*  --  78.6*  MCH 23.3* 23.0*  --  23.4*  MCHC 30.5 29.8*  --  29.8*  RDW 19.6* 20.0*  --  19.8*  PLT 199 200  --  189   Thyroid No results for input(s): TSH, FREET4 in the last 168 hours.  BNPNo results for input(s): BNP, PROBNP in the last 168 hours.  DDimer No results for input(s): DDIMER in the last 168 hours.    Radiology    No results found.  Cardiac Studies   CARDIAC CATH: 05/04/2018 Mid RCA lesion is 100% stenosed. Ost RCA to Prox RCA lesion is 99% stenosed. Ost LAD to Prox LAD lesion is 100% stenosed. Mid Cx to Dist Cx lesion is 100% stenosed. Origin to Prox Graft lesion is 100% stenosed. Mid LAD to Dist LAD lesion is 90% stenosed with 100% stenosed side branch in Ost 3rd Sept. Origin to Prox Graft lesion is 100% stenosed. Ost Cx to Prox Cx lesion is 99% stenosed. Scoring balloon angioplasty was performed using a BALLOON WOLVERINE 2.75X10. Post intervention, there is a 30% residual stenosis. The left ventricular ejection fraction is 50-55% by visual estimate. LV end diastolic pressure is normal. IMPRESSION: Mr. Biss had anatomy fairly similar to what it was in 2017.  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  The mid to distal AV groove circumflex stent was occluded at this point whereas 3 years ago it was subtotally occluded.  His distal LAD beyond LIMA insertion was diffusely and severely diseased as well.     I performed Cutting Balloon atherectomy reducing a 99% lesion to less than 30%.  Patient tolerated procedure well.  He was already on aspirin Plavix.  Angiomax was turned off.  He left the lab in stable condition on IV nitroglycerin for blood pressure control.  The sheath will be removed and pressure held.  Patient will be gently hydrated and most likely discharged home tomorrow on aspirin and Plavix.  Intervention    Patient Profile     65 y.o. male with PMH CAD s/p CABG and prior PCI, hypertension, hyperlipidemia, type II diabetes, chronic kidney disease who presented with chest pain, then had VT arrest 10/16/20.  Assessment & Plan    NSTEMI/VT arrest CAD s/p CABG 2009 w/ subsequent PCIs -high suspicion for ischemia as cause of VT arrest -we discussed cath once Cr improves. He is amenable. -ventricular  ectopy much improved on IV amiodarone, continue. Rate decreased yesterday due to concern for bradycardia. I do not see this on the monitor, but he has not had NSVT (does have ventricular ectopy/trigeminy intermittently). Ok to continue amiodarone at lower rate   AKI on chronic kidney disease stage 3b - Cr peak 3.89, felt 2/2 cardiac arrest, ?ATN -  Cr 3.11 today, downtrending but too high for contrast - baseline seems to be 1.5 - 1.9, GFR 41 prior to admission - avoid nephrotoxic agents as much as able  For questions or updates, please contact [place] Please consult [email or web address] for contact info under     Signed, Buford Dresser, MD  10/25/2020, 9:17 AM

## 2020-10-25 NOTE — Progress Notes (Addendum)
HD#10 SUBJECTIVE:  Patient Summary: Darrell [name] is a 65 y.o. with a pertinent PMH of CAD status post CABG (2009) and PCI's, chronic systolic heart failure, CKD 3A, hypertension, type 2 diabetes, HLD admitted September 18 with NSTEMI with subsequent V. tach/PEA arrest with successful ROSC, with plans for heart cath pending improvement of renal function secondary to acute tubular necrosis.  Patient had episode of fever up to 103.1 F on 09/26, has remained afebrile since.  UA showed no nitrites but positive leukocytes and few bacteria.  Urine culture growing E. Coli,  Initiated Ceftriaxone.  Overnight Events: Patient evaluated at bedside this AM.  Patient did have intermittent bradycardia yesterday evening and IV amiodarone was titrated down.  No concerns overnight.  He denies dysuria, diarrhea, and change in coloration of stools.  OBJECTIVE:  Vital Signs: Vitals:   10/24/20 1634 10/24/20 1941 10/24/20 2313 10/25/20 0322  BP: 117/70 (!) 94/56 (!) 117/58 131/76  Pulse: 60 (!) 52 (!) 55 63  Resp: 18 17    Temp: 97.8 F (36.6 C) 97.7 F (36.5 C) 97.6 F (36.4 C) 97.6 F (36.4 C)  TempSrc: Oral Oral Oral Oral  SpO2: 100% 99% 98% 97%  Weight:    106 kg  Height:       Supplemental O2: Nasal Cannula SpO2: 97 % O2 Flow Rate (L/min): 2 L/min FiO2 (%): 30 %  Filed Weights   10/15/20 1700 10/24/20 0401 10/25/20 0322  Weight: 98.2 kg 105.7 kg 106 kg     Intake/Output Summary (Last 24 hours) at 10/25/2020 4315 Last data filed at 10/25/2020 4008 Gross per 24 hour  Intake 1346.72 ml  Output 801 ml  Net 545.72 ml   Net IO Since Admission: 2,723.32 mL [10/25/20 0603]  Physical Exam: General: well- Developed, well nourished HENT: NCAT Eyes: No scleral icterus, conjunctiva clear CV: No murmurs, normal rate and rhythm Pulm: Clear to auscultation bilaterally, pulmonary effort normal GI: No tenderness, bowel sounds present MSK: No pitting edema in lower extremities bilaterally,  normal bulk and tone Skin: Warm and dry Psych: Normal mood and affect  Patient Lines/Drains/Airways Status     Active Line/Drains/Airways     Name Placement date Placement time Site Days   CVC Triple Lumen 10/16/20 Left Subclavian 10/16/20  1039  -- 9   External Urinary Catheter 10/23/20  1128  --  2            Pertinent Labs: CBC Latest Ref Rng & Units 10/24/2020 10/24/2020 10/23/2020  WBC 4.0 - 10.5 K/uL - 16.1(H) 9.9  Hemoglobin 13.0 - 17.0 g/dL 6.7(Y) 7.4(L) 8.0(L)  Hematocrit 39.0 - 52.0 % 27.6(L) 24.8(L) 26.2(L)  Platelets 150 - 400 K/uL - 200 199    CMP Latest Ref Rng & Units 10/24/2020 10/23/2020 10/22/2020  Glucose 70 - 99 mg/dL 195(K) 932(I) 712(W)  BUN 8 - 23 mg/dL 58(K) 99(I) 33(A)  Creatinine 0.61 - 1.24 mg/dL 2.50(N) 3.97(Q) 7.34(L)  Sodium 135 - 145 mmol/L 132(L) 134(L) 135  Potassium 3.5 - 5.1 mmol/L 3.7 3.8 3.9  Chloride 98 - 111 mmol/L 102 104 104  CO2 22 - 32 mmol/L 18(L) 19(L) 21(L)  Calcium 8.9 - 10.3 mg/dL 8.3(L) 8.2(L) 8.3(L)  Total Protein 6.5 - 8.1 g/dL - - -  Total Bilirubin 0.3 - 1.2 mg/dL - - -  Alkaline Phos 38 - 126 U/L - - -  AST 15 - 41 U/L - - -  ALT 0 - 44 U/L - - -  Recent Labs    10/24/20 1110 10/24/20 1608 10/24/20 2106  GLUCAP 163* 157* 131*     Pertinent Imaging: No results found.  ASSESSMENT/PLAN:  Assessment: Principal Problem:   NSTEMI (non-ST elevated myocardial infarction) (HCC) Active Problems:   Type 2 diabetes mellitus without complication, without long-term current use of insulin (HCC)   Adjustment disorder with depressed mood   Coronary atherosclerosis, s/p CABG in 2009    History of noncompliance with medical treatment, presenting hazards to health   AKI (acute kidney injury) (HCC)   Diabetes mellitus type 2 in nonobese (HCC)   Anemia   Chest pain   VT (ventricular tachycardia) (HCC)   Cardiac arrest (HCC)  Darrell [name] is a 65 y.o. with a pertinent PMH of CAD status post CABG (2009) and PCI's,  chronic systolic heart failure, CKD 3A, hypertension, type 2 diabetes, HLD admitted September 18 with NSTEMI with subsequent V. tach/PEA arrest with successful ROSC, with plans for heart cath pending improvement of renal function secondary to acute tubular necrosis.  Patient had episode of fever up to 103.1 F on 09/26, has remained afebrile since.  UA showed no nitrites but positive leukocytes and few bacteria.  Urine culture growing E. Coli, Initiated Ceftriaxone.  Plan: #NSTEMI  #V. tach arrest #AKI on chronic kidney disease stage III Patient had several episodes of bradycardia with heart rate in the 30s to 40s yesterday while on IV amiodarone 60 mg/h.  IV amiodarone discontinued for 1 hour and titrated with heart rate.  Creatinine continues to slowly improve from 3.11-3.07 today, remains too high for contrast with cardiac catheterization.  On telemetry patient continues to have ventricular ectopy on IV amiodarone. -IV amiodarone per cardiology -repletion of K  #Urinary tract infection Patient had 1 episode of fever of 103.1 F 3 nights ago, has remained afebrile since then.  Foley catheter was in place for 7 days and was removed.  White blood cell had increased from 9.9-16.1 with decreased today back down to 8.7.  Urine culture grew E. coli 100,000 colonies.  Patient was initiated on ceftriaxone.   -Continue ceftriaxone day 2.  Will transition patient to oral Keflex in a.m. to complete a 7-day course for catheter associated UTI  #Microcytic anemia Following ventricular tachycardia arrest, patient was found to have hemoglobin of 6.8.  He was transfused 2 units at that time and hemoglobin increased to 10.1.  Since then, hemoglobin has slowly down trended.  Yesterday it was at 7.4, patient received 1 unit of RBCs and posttransfusion hemoglobin was at 8.4.  Today his hemoglobin is at 8.1.  Previous iron studies in August 2022 have shown that he is iron deficient with iron of 18 and saturation ratios of  5%. No documentation of last colonoscopy.  Patient states that he has not noticed any dark stools.  Patient currently is being treated for UTI, but would most likely benefit from iron transfusion once infection resolves.  #Severe Onchomycosis  Patient noted to have severe onychomycosis on his right great toe.   Best Practice: Diet: Cardiac diet IVF: Fluids: none VTE: heparin per pharmacy Code: Full AB: Ceftriaxone Therapy Recs: None, DME:3 in 1 Family Contact: attempted to contact wife, was not able to leave voicemail DISPO: Anticipated discharge once creatinine improves and patient is able to undergo catheretization and is stable off of IV amiodarone.  Signature: Rudene Christians, D.O. Internal Medicine Resident, PGY-1 [facility]  Pager: [phone] 6:04 AM, 10/25/2020   Please contact the on  call pager after 5 pm and on weekends at [phone].

## 2020-10-25 NOTE — Care Management Important Message (Signed)
Important Message  Patient Details  Name: Darrell [name] MRN: [id number] Date of Birth: 1955/06/23   Medicare Important Message Given:  Yes     Renie Ora 10/25/2020, 10:26 AM

## 2020-10-25 NOTE — Plan of Care (Signed)

## 2020-10-26 DIAGNOSIS — I472 Ventricular tachycardia: Secondary | ICD-10-CM | POA: Diagnosis not present

## 2020-10-26 DIAGNOSIS — I469 Cardiac arrest, cause unspecified: Secondary | ICD-10-CM | POA: Diagnosis not present

## 2020-10-26 DIAGNOSIS — N179 Acute kidney failure, unspecified: Secondary | ICD-10-CM | POA: Diagnosis not present

## 2020-10-26 DIAGNOSIS — I214 Non-ST elevation (NSTEMI) myocardial infarction: Secondary | ICD-10-CM | POA: Diagnosis not present

## 2020-10-26 LAB — CBC
HCT: 28.7 % — ABNORMAL LOW (ref 39.0–52.0)
Hemoglobin: 8.8 g/dL — ABNORMAL LOW (ref 13.0–17.0)
MCH: 23.8 pg — ABNORMAL LOW (ref 26.0–34.0)
MCHC: 30.7 g/dL (ref 30.0–36.0)
MCV: 77.6 fL — ABNORMAL LOW (ref 80.0–100.0)
Platelets: 207 10*3/uL (ref 150–400)
RBC: 3.7 MIL/uL — ABNORMAL LOW (ref 4.22–5.81)
RDW: 19.9 % — ABNORMAL HIGH (ref 11.5–15.5)
WBC: 7.1 10*3/uL (ref 4.0–10.5)
nRBC: 0 % (ref 0.0–0.2)

## 2020-10-26 LAB — GLUCOSE, CAPILLARY
Glucose-Capillary: 131 mg/dL — ABNORMAL HIGH (ref 70–99)
Glucose-Capillary: 150 mg/dL — ABNORMAL HIGH (ref 70–99)
Glucose-Capillary: 161 mg/dL — ABNORMAL HIGH (ref 70–99)
Glucose-Capillary: 164 mg/dL — ABNORMAL HIGH (ref 70–99)
Glucose-Capillary: 167 mg/dL — ABNORMAL HIGH (ref 70–99)

## 2020-10-26 LAB — HEPARIN LEVEL (UNFRACTIONATED)
Heparin Unfractionated: 0.56 IU/mL (ref 0.30–0.70)
Heparin Unfractionated: 0.52 IU/mL (ref 0.30–0.70)

## 2020-10-26 LAB — BASIC METABOLIC PANEL
Anion gap: 10 (ref 5–15)
BUN: 37 mg/dL — ABNORMAL HIGH (ref 8–23)
CO2: 18 mmol/L — ABNORMAL LOW (ref 22–32)
Calcium: 8.3 mg/dL — ABNORMAL LOW (ref 8.9–10.3)
Chloride: 107 mmol/L (ref 98–111)
Creatinine, Ser: 2.77 mg/dL — ABNORMAL HIGH (ref 0.61–1.24)
GFR, Estimated: 25 mL/min — ABNORMAL LOW (ref >60)
Glucose, Bld: 136 mg/dL — ABNORMAL HIGH (ref 70–99)
Potassium: 3.9 mmol/L (ref 3.5–5.1)
Sodium: 135 mmol/L (ref 135–145)

## 2020-10-26 LAB — MAGNESIUM: Magnesium: 1.7 mg/dL (ref 1.7–2.4)

## 2020-10-26 MED ORDER — POTASSIUM CHLORIDE 20 MEQ PO PACK
20.0000 meq | PACK | Freq: Once | ORAL | Status: AC
  Administered 2020-10-26: 20 meq via ORAL
  Filled 2020-10-26: qty 1

## 2020-10-26 MED ORDER — MAGNESIUM SULFATE 2 GM/50ML IV SOLN
2.0000 g | Freq: Once | INTRAVENOUS | Status: AC
  Administered 2020-10-26: 2 g via INTRAVENOUS
  Filled 2020-10-26: qty 50

## 2020-10-26 MED ORDER — CEPHALEXIN 250 MG PO CAPS
250.0000 mg | ORAL_CAPSULE | Freq: Two times a day (BID) | ORAL | Status: AC
  Administered 2020-10-27 – 2020-10-30 (×8): 250 mg via ORAL
  Filled 2020-10-26 (×9): qty 1

## 2020-10-26 NOTE — Progress Notes (Signed)
Pt had active order of Amio drip at 60mg /hr , per cards note it was supposed to be 30mg /hr. Pt's HR in 50's. On call Cardiology Dr. text paged. Order changed to 30mg /hr by MD. See MAR for rate dose change. Will continue to monitor.

## 2020-10-26 NOTE — Progress Notes (Signed)
ANTICOAGULATION CONSULT NOTE - Consult  Pharmacy Consult for IV Heparin Indication: chest pain/ACS  No Known Allergies  Patient Measurements: Height: _0  (185.4 cm) Weight: 104.4 kg (230 lb 2.6 oz) IBW/kg (Calculated) : 79.9  Heparin Dosing Weight: 98 kg  Vital Signs: Temp: 98.8 F (37.1 C) (09/29 2026) Temp Source: Oral (09/29 2026) BP: 148/81 (09/29 2026) Pulse Rate: 60 (09/29 2026)  Labs: Recent Labs    10/24/20 0500 10/24/20 1830 10/25/20 0500 10/25/20 1800 10/25/20 1925 10/26/20 0726 10/26/20 1930  HGB 7.4* 8.4* 8.1*  --   --  8.8*  --   HCT 24.8* 27.6* 27.2*  --   --  28.7*  --   PLT 200  --  189  --   --  207  --   HEPARINUNFRC 0.31  --  0.21*   < > 0.29* 0.56 0.52  CREATININE 3.11*  --  3.07*  --   --  2.77*  --    < > = values in this interval not displayed.     Estimated Creatinine Clearance: 34.2 mL/min (A) (by C-G formula based on SCr of 2.77 mg/dL (H)).   Medications:  Scheduled:   amLODipine  5 mg Oral Daily   aspirin EC  81 mg Oral Daily   atorvastatin  80 mg Oral QPM   carvedilol  6.25 mg Oral BID WC   [START ON 10/27/2020] cephALEXin  250 mg Oral BID   chlorhexidine gluconate (MEDLINE KIT)  15 mL Mouth Rinse BID   Chlorhexidine Gluconate Cloth  6 each Topical Daily   insulin aspart  0-15 Units Subcutaneous TID WC   pantoprazole  40 mg Oral Daily   sodium chloride flush  10-40 mL Intracatheter Q12H   Infusions:   amiodarone 30 mg/hr (10/26/20 1637)   heparin 1,750 Units/hr (10/26/20 1638)    Assessment: 65 yr old man with recent NSTEMI (declined cardiac cath at that time) presented with CP, elevated troponins. Pt was on heparin for planned 48 hr due to refusal of cath, now S/P VF arrest; heparin continued while pending possible change in cath plans. Awaiting cath (pt now agreeable), but Scr still elevated (trending down: 3.89 > 2.77)   Heparin level 0.52 (on heparin 1750 units)  Goal of Therapy:  Heparin level 0.3-0.5 units/ml (no  boluses) Monitor platelets by anticoagulation protocol: Yes   Plan:  Heparin level is labile. Will continue heparin 1750 units/hr. Recheck heparin level with am labs. Consider decreasing back to heparin 1700 units/hr if >0.5 Daily heparin level and CBC ordered  Thank you for allowing pharmacy to be a part of this patient's care.  Donnald Garre, PharmD Clinical Pharmacist  Please check AMION for all [place] numbers After 10:00 PM, call [place] [phone]

## 2020-10-26 NOTE — Progress Notes (Signed)
Physical Therapy Treatment Patient Details Name: Darrell [name] MRN: [id number] DOB: Feb 08, 1955 Today's Date: 10/26/2020   History of Present Illness Pt is a 65 y.o. male admitted 10/15/20 with c/o chest pain. Pt with NSTEMI/VT arrest on 9/19; ETT 9/19-9/20. Anticipate LHC when creatinine improves. PMH includes CHF, CABG (2009), NSTEMI (8/22), bedbug infestation, CKD, CAD, DM, HTN.   PT Comments    Pt slowly progressing with mobility. Pt tolerated brief standing activity, requiring minA to stand and maintain balance; pt quickly sitting in recliner then declines any additional mobility, stating, "I don't want to overdo it." Pt reports mod indep prior to hospital admission, but unwilling to progress mobility this session despite max encouragement and education on importance. Increased time discussing safe mobility upon return home; pt insists son and wife can assist with all aspects of mobility/ADLs, and pt has a w/c to use if needed. Will continue to follow acutely to address established goals.    Recommendations for follow up therapy are one component of a multi-disciplinary discharge planning process, led by the attending physician.  Recommendations may be updated based on patient status, additional functional criteria and insurance authorization.  Follow Up Recommendations  Home health PT;Supervision/Assistance - 24 hour (declined SNF)     Equipment Recommendations  3in1 (PT)    Recommendations for Other Services       Precautions / Restrictions Precautions Precautions: Fall Restrictions Weight Bearing Restrictions: No     Mobility  Bed Mobility Overal bed mobility: Needs Assistance Bed Mobility: Supine to Sit     Supine to sit: Supervision;HOB elevated     General bed mobility comments: Repeated cues to complete task    Transfers Overall transfer level: Needs assistance Equipment used: Rolling walker (2 wheeled) Transfers: Sit to/from Stand Sit to Stand: Min  assist         General transfer comment: Pt with multiple attempts to stand pulling on RW despite walker tipping over on top of him, verbal cues for safety/sequencing/hand placement, reliant on momentum and minA to stand; poor eccentric control to sit  Ambulation/Gait Ambulation/Gait assistance: Min guard Gait Distance (Feet): 2 Feet Assistive device: Rolling walker (2 wheeled) Gait Pattern/deviations: Step-to pattern;Trunk flexed Gait velocity: Decreased   General Gait Details: Slow, unsteady steps from bed to recliner with RW and min guard; pt immediately going to sit down in recliner, adamantly declining additional standing or other mobility; reports reason being, "I don't want to overdo it"   Stairs             Wheelchair Mobility    Modified Rankin (Stroke Patients Only)       Balance Overall balance assessment: Needs assistance Sitting-balance support: No upper extremity supported;Feet supported Sitting balance-Leahy Scale: Fair     Standing balance support: During functional activity;Bilateral upper extremity supported Standing balance-Leahy Scale: Poor Standing balance comment: reliant on BUE support in standing                            Cognition Arousal/Alertness: Awake/alert Behavior During Therapy: Flat affect;Impulsive Overall Cognitive Status: No family/caregiver present to determine baseline cognitive functioning                                 General Comments: pt with poor safety awareness; slow processing, flat affect, sometimes does not answer questions or very vague      Exercises  General Comments General comments (skin integrity, edema, etc.): adamantly declining additional standing or other mobility; reports reason being, "I don't want to overdo it" -- therefore significant increased time discussing safe return home as pt with very limited mobility and declines post-acute rehab; pt reports son will assist pt  up 3 steps into home, can help pt in/out with wheelchair (gravel walkway to front door); pt plans to be able to walk down hallway into bathroom with RW, reports wife can assist with the walk, and assist standing from toilet if needed (raised toilet height); house is one-level, wheelchair fits in hallways; wife can assist with bird baths at sink -- family not present to verify this info      Pertinent Vitals/Pain Pain Assessment: No/denies pain    Home Living                      Prior Function            PT Goals (current goals can now be found in the care plan section) Progress towards PT goals: Not progressing toward goals - comment (fatigue, self-limiting)    Frequency    Min 3X/week      PT Plan Current plan remains appropriate    Co-evaluation              AM-PAC PT "6 Clicks" Mobility   Outcome Measure  Help needed turning from your back to your side while in a flat bed without using bedrails?: A Little Help needed moving from lying on your back to sitting on the side of a flat bed without using bedrails?: A Little Help needed moving to and from a bed to a chair (including a wheelchair)?: A Little Help needed standing up from a chair using your arms (e.g., wheelchair or bedside chair)?: A Lot Help needed to walk in hospital room?: A Little Help needed climbing 3-5 steps with a railing? : A Lot 6 Click Score: 16    End of Session Equipment Utilized During Treatment: Gait belt Activity Tolerance: Patient limited by fatigue Patient left: in chair;with call bell/phone within reach;with chair alarm set Nurse Communication: Mobility status PT Visit Diagnosis: Other abnormalities of gait and mobility (R26.89);Unsteadiness on feet (R26.81);Muscle weakness (generalized) (M62.81)     Time: 1610-9604 PT Time Calculation (min) (ACUTE ONLY): 20 min  Charges:  $Self Care/Home Management: 8-22                     Ina Homes, PT, DPT Acute Rehabilitation  Services  Pager [phone] Office [phone]  Malachy Chamber 10/26/2020, 5:13 PM

## 2020-10-26 NOTE — Progress Notes (Addendum)
HD#11 SUBJECTIVE:  Patient Summary: Darrell [name] is a 65 y.o. with a pertinent PMH of CAD status post CABG (2009) and PCI's, chronic systolic heart failure, CKD 3A, hypertension, type 2 diabetes, HLD admitted September 18 with NSTEMI with subsequent V. tach/PEA arrest with successful ROSC, with plans for heart cath pending improvement of renal function secondary to acute tubular necrosis.  Patient had episode of fever up to 103.1 F on 09/26, has remained afebrile since.  Received 3 days of ceftriaxone and transitioning to oral Keflex for 4 additional days.  Overnight Events: Patient evaluated at bedside this AM.  Patient has remained stable in the 50s.  Concerns from the patient at this time.  No chest pain or shortness of breath.   OBJECTIVE:  Vital Signs: Vitals:   10/25/20 1624 10/25/20 2010 10/26/20 0013 10/26/20 0439  BP: (!) 141/80 (!) 114/56 128/63 (!) 135/50  Pulse: 60 (!) 57 (!) 56 60  Resp: 19 18    Temp: 98.3 F (36.8 C) 97.7 F (36.5 C) (!) 97.4 F (36.3 C) 98.8 F (37.1 C)  TempSrc: Oral Oral Oral Oral  SpO2: 98% 96% 94% 95%  Weight:   104.4 kg   Height:       Supplemental O2: Nasal Cannula SpO2: 95 % O2 Flow Rate (L/min): 0 L/min FiO2 (%): 30 %  Filed Weights   10/24/20 0401 10/25/20 0322 10/26/20 0013  Weight: 105.7 kg 106 kg 104.4 kg     Intake/Output Summary (Last 24 hours) at 10/26/2020 0615 Last data filed at 10/26/2020 0358 Gross per 24 hour  Intake 749.44 ml  Output 1500 ml  Net -750.56 ml   Net IO Since Admission: 1,172.76 mL [10/26/20 0615]  Physical Exam: General: well-Developed, well nourished HENT: NCAT Eyes: No scleral icterus, conjunctiva clear CV: No murmurs, normal rate and rhythm Pulm: Clear to auscultation bilaterally, pulmonary effort normal GI: No tenderness, bowel sounds present MSK: No pitting edema in lower left extremities bilaterally, normal bulk and tone Skin: Warm and dry Psych: Flat affect, mood depressed  Patient  Lines/Drains/Airways Status     Active Line/Drains/Airways     Name Placement date Placement time Site Days   CVC Triple Lumen 10/16/20 Left Subclavian 10/16/20  1039  -- 10   External Urinary Catheter 10/23/20  1128  --  3            Pertinent Labs: CBC Latest Ref Rng & Units 10/25/2020 10/24/2020 10/24/2020  WBC 4.0 - 10.5 K/uL 8.7 - 16.1(H)  Hemoglobin 13.0 - 17.0 g/dL 8.1(L) 8.4(L) 7.4(L)  Hematocrit 39.0 - 52.0 % 27.2(L) 27.6(L) 24.8(L)  Platelets 150 - 400 K/uL 189 - 200    CMP Latest Ref Rng & Units 10/25/2020 10/24/2020 10/23/2020  Glucose 70 - 99 mg/dL 948(N) 462(V) 035(K)  BUN 8 - 23 mg/dL 09(F) 81(W) 29(H)  Creatinine 0.61 - 1.24 mg/dL 3.71(I) 9.67(E) 9.38(B)  Sodium 135 - 145 mmol/L 131(L) 132(L) 134(L)  Potassium 3.5 - 5.1 mmol/L 3.4(L) 3.7 3.8  Chloride 98 - 111 mmol/L 101 102 104  CO2 22 - 32 mmol/L 18(L) 18(L) 19(L)  Calcium 8.9 - 10.3 mg/dL 8.0(L) 8.3(L) 8.2(L)  Total Protein 6.5 - 8.1 g/dL - - -  Total Bilirubin 0.3 - 1.2 mg/dL - - -  Alkaline Phos 38 - 126 U/L - - -  AST 15 - 41 U/L - - -  ALT 0 - 44 U/L - - -    Recent Labs    10/25/20 1118  10/25/20 1622 10/25/20 2109  GLUCAP 140* 156* 175*     Pertinent Imaging: No results found.  ASSESSMENT/PLAN:  Assessment: Principal Problem:   NSTEMI (non-ST elevated myocardial infarction) (HCC) Active Problems:   Type 2 diabetes mellitus without complication, without long-term current use of insulin (HCC)   Adjustment disorder with depressed mood   Coronary atherosclerosis, s/p CABG in 2009    History of noncompliance with medical treatment, presenting hazards to health   AKI (acute kidney injury) (HCC)   Diabetes mellitus type 2 in nonobese (HCC)   Anemia   Chest pain   VT (ventricular tachycardia) (HCC)   Cardiac arrest (HCC)  Darrell [name] is a 65 y.o. with a pertinent PMH of CAD status post CABG (2009) and PCI's, chronic systolic heart failure, CKD 3A, hypertension, type 2 diabetes, HLD  admitted September 18 with NSTEMI with subsequent V. tach/PEA arrest with successful ROSC, with plans for heart cath pending improvement of renal function secondary to acute tubular necrosis.  Patient had episode of fever up to 103.1 F on 09/26, has remained afebrile since.  Received 3 days of ceftriaxone and transitioning to oral Keflex for 4 additional days.  Plan: #NSTEMI #V. tach arrest #AKI on chronic kidney disease stage III Creatinine has continued to trend down from 3.07 to 2.77 today. Creatinine 1.5-1.9.  Anticipate that cardiac catheterization will not be until sometime next week due to the trajectory of creatinine improvement. -Continue IV amiodarone per cardiology -Continue aspirin and statin -Continue amlodipine 5 mg qd and carvedilol 6.25 mg  BID  #Urinary tract infection Urinary culture grew E. coli.  Patient has received 3 days of ceftriaxone, will transition him to oral Keflex to 250 mg twice daily for 4 additional days. -Discontinued ceftriaxone, and transition to Keflex to 250 mg twice daily for 4 additional days  #Iron deficiency anemia Patient received 1 unit of RBCs two days ago.  Hemoglobin improved from 8.1-8.8 today. Per Loel Ro equation, Total iron deficit at 801 mg.  Patient currently being treated for UTI, will likely benefit from iron repletion following discharge.  He will also need further work-up for his iron deficiency anemia  Best Practice: Diet: Cardiac diet IVF: Fluids: none VTE: heparin per pharmacy Code: Full AB: Ceftriaxone day 3/3, switching to Oral Keflex to finish course Family Contact: updated wife via phone yesterday DISPO: Anticipated discharge in 5-7 days pending medical stability  Signature: Rudene Christians, D.O. Internal Medicine Resident, PGY-1 [facility]  Pager: [phone] 6:15 AM, 10/26/2020   Please contact the on call pager after 5 pm and on weekends at [phone].

## 2020-10-26 NOTE — Progress Notes (Signed)
ANTICOAGULATION CONSULT NOTE - Consult  Pharmacy Consult for IV Heparin Indication: chest pain/ACS  No Known Allergies  Patient Measurements: Height: _0  (185.4 cm) Weight: 104.4 kg (230 lb 2.6 oz) IBW/kg (Calculated) : 79.9  Heparin Dosing Weight: 98 kg  Vital Signs: Temp: 98.6 F (37 C) (09/29 1108) Temp Source: Oral (09/29 1108) BP: 149/86 (09/29 1108) Pulse Rate: 62 (09/29 1108)  Labs: Recent Labs    10/24/20 0500 10/24/20 1830 10/25/20 0500 10/25/20 1800 10/25/20 1925 10/26/20 0726  HGB 7.4* 8.4* 8.1*  --   --  8.8*  HCT 24.8* 27.6* 27.2*  --   --  28.7*  PLT 200  --  189  --   --  207  HEPARINUNFRC 0.31  --  0.21* 0.19* 0.29* 0.56  CREATININE 3.11*  --  3.07*  --   --  2.77*    Estimated Creatinine Clearance: 34.2 mL/min (A) (by C-G formula based on SCr of 2.77 mg/dL (H)).   Medications:  Scheduled:   amLODipine  5 mg Oral Daily   aspirin EC  81 mg Oral Daily   atorvastatin  80 mg Oral QPM   carvedilol  6.25 mg Oral BID WC   chlorhexidine gluconate (MEDLINE KIT)  15 mL Mouth Rinse BID   Chlorhexidine Gluconate Cloth  6 each Topical Daily   insulin aspart  0-15 Units Subcutaneous TID WC   pantoprazole  40 mg Oral Daily   sodium chloride flush  10-40 mL Intracatheter Q12H   Infusions:   amiodarone 30 mg/hr (10/26/20 0342)   cefTRIAXone (ROCEPHIN)  IV Stopped (10/25/20 1828)   heparin 1,800 Units/hr (10/26/20 0042)   magnesium sulfate bolus IVPB 2 g (10/26/20 1029)    Assessment: 65 yr old man with recent NSTEMI (declined cardiac cath at that time) presented with CP, elevated troponins. Pt was on heparin for planned 48 hr due to refusal of cath, now S/P VF arrest; heparin continued while pending possible change in cath plans. Awaiting cath (pt now agreeable), but Scr still elevated (trending down: 3.89 > 2.77)   Heparin level now slightly above goal at 0.56. CBC stable. Will decrease heparin rate slightly.   Goal of Therapy:  Heparin level 0.3-0.5  units/ml (no boluses) Monitor platelets by anticoagulation protocol: Yes   Plan:  Decrease heparin infusion to 1750 units/hr Check heparin level in 8 hours and daily while on heparin Continue to monitor H&H and platelets   Thank you for allowing pharmacy to be a part of this patient's care.  Ardyth Harps, PharmD Clinical Pharmacist

## 2020-10-26 NOTE — Progress Notes (Signed)
Progress Note  Patient Name: Darrell [name] Date of Encounter: 10/26/2020  [facility] Cardiologist: Minus Breeding, MD   Subjective   No complaints of chest pain, SOB, or palpitations. No new issues overnight.   Inpatient Medications    Scheduled Meds:  amLODipine  5 mg Oral Daily   aspirin EC  81 mg Oral Daily   atorvastatin  80 mg Oral QPM   carvedilol  6.25 mg Oral BID WC   chlorhexidine gluconate (MEDLINE KIT)  15 mL Mouth Rinse BID   Chlorhexidine Gluconate Cloth  6 each Topical Daily   insulin aspart  0-15 Units Subcutaneous TID WC   pantoprazole  40 mg Oral Daily   sodium chloride flush  10-40 mL Intracatheter Q12H   Continuous Infusions:  amiodarone 30 mg/hr (10/26/20 0342)   cefTRIAXone (ROCEPHIN)  IV Stopped (10/25/20 1828)   heparin 1,800 Units/hr (10/26/20 0042)   PRN Meds: albuterol, alum & mag hydroxide-simeth, nitroGLYCERIN, ondansetron (ZOFRAN) IV, sodium chloride flush   Vital Signs    Vitals:   10/25/20 2010 10/26/20 0013 10/26/20 0439 10/26/20 0726  BP: (!) 114/56 128/63 (!) 135/50 (!) 164/76  Pulse: (!) 57 (!) 56 60 64  Resp: 18   18  Temp: 97.7 F (36.5 C) (!) 97.4 F (36.3 C) 98.8 F (37.1 C) 97.9 F (36.6 C)  TempSrc: Oral Oral Oral Oral  SpO2: 96% 94% 95% 93%  Weight:  104.4 kg    Height:        Intake/Output Summary (Last 24 hours) at 10/26/2020 0818 Last data filed at 10/26/2020 1751 Gross per 24 hour  Intake 749.44 ml  Output 2000 ml  Net -1250.56 ml   Last 3 Weights 10/26/2020 10/25/2020 10/24/2020  Weight (lbs) 230 lb 2.6 oz 233 lb 11 oz 233 lb 0.4 oz  Weight (kg) 104.4 kg 106 kg 105.7 kg      Telemetry    Sinus rhythm with occasional PVCs/PACs; no significant pauses or NSVT - Personally Reviewed  ECG    No new tracings - Personally Reviewed  Physical Exam   GEN: No acute distress.   Neck: No JVD Cardiac: RRR, no murmurs, rubs, or gallops.  Respiratory: Clear to auscultation bilaterally. GI: Soft, nontender,  non-distended  MS: No edema; No deformity. Neuro:  Nonfocal  Psych: Normal affect   Labs    High Sensitivity Troponin:   Recent Labs  Lab 10/15/20 0502 10/15/20 0750 10/16/20 0953 10/16/20 1143  TROPONINIHS 685* 902* 6,892* 6,596*     Chemistry Recent Labs  Lab 10/20/20 0450 10/21/20 0320 10/23/20 0429 10/24/20 0500 10/25/20 0500  NA 134*   < > 134* 132* 131*  K 4.0   < > 3.8 3.7 3.4*  CL 103   < > 104 102 101  CO2 19*   < > 19* 18* 18*  GLUCOSE 99   < > 123* 139* 125*  BUN 36*   < > 38* 42* 44*  CREATININE 3.89*   < > 3.14* 3.11* 3.07*  CALCIUM 8.1*   < > 8.2* 8.3* 8.0*  MG 2.1  --   --   --  1.6*  GFRNONAA 16*   < > 21* 22* 22*  ANIONGAP 12   < > $R'11 12 12   'Cq$ < > = values in this interval not displayed.    Lipids No results for input(s): CHOL, TRIG, HDL, LABVLDL, LDLCALC, CHOLHDL in the last 168 hours.  Hematology Recent Labs  Lab 10/24/20 0500 10/24/20 1830  10/25/20 0500 10/26/20 0726  WBC 16.1*  --  8.7 7.1  RBC 3.22*  --  3.46* 3.70*  HGB 7.4* 8.4* 8.1* 8.8*  HCT 24.8* 27.6* 27.2* 28.7*  MCV 77.0*  --  78.6* 77.6*  MCH 23.0*  --  23.4* 23.8*  MCHC 29.8*  --  29.8* 30.7  RDW 20.0*  --  19.8* 19.9*  PLT 200  --  189 207   Thyroid No results for input(s): TSH, FREET4 in the last 168 hours.  BNPNo results for input(s): BNP, PROBNP in the last 168 hours.  DDimer No results for input(s): DDIMER in the last 168 hours.   Radiology    No results found.  Cardiac Studies   CARDIAC CATH: 05/04/2018 Mid RCA lesion is 100% stenosed. Ost RCA to Prox RCA lesion is 99% stenosed. Ost LAD to Prox LAD lesion is 100% stenosed. Mid Cx to Dist Cx lesion is 100% stenosed. Origin to Prox Graft lesion is 100% stenosed. Mid LAD to Dist LAD lesion is 90% stenosed with 100% stenosed side branch in Ost 3rd Sept. Origin to Prox Graft lesion is 100% stenosed. Ost Cx to Prox Cx lesion is 99% stenosed. Scoring balloon angioplasty was performed using a BALLOON WOLVERINE  2.75X10. Post intervention, there is a 30% residual stenosis. The left ventricular ejection fraction is 50-55% by visual estimate. LV end diastolic pressure is normal. IMPRESSION: Mr. Brosnahan had anatomy fairly similar to what it was in 2017.  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  The mid to distal AV groove circumflex stent was occluded at this point whereas 3 years ago it was subtotally occluded.  His distal LAD beyond LIMA insertion was diffusely and severely diseased as well.     I performed Cutting Balloon atherectomy reducing a 99% lesion to less than 30%.  Patient tolerated procedure well.  He was already on aspirin Plavix.  Angiomax was turned off.  He left the lab in stable condition on IV nitroglycerin for blood pressure control.  The sheath will be removed and pressure held.  Patient will be gently hydrated and most likely discharged home tomorrow on aspirin and Plavix.  Intervention     Patient Profile     65 y.o. male with PMH CAD s/p CABG and prior PCI, hypertension, hyperlipidemia, type II diabetes, chronic kidney disease who presented with chest pain, then had VT arrest 10/16/20.  Assessment & Plan    1. NSTEMI/VT arrest in patient with history of CAD s/p CABG in 2009 with subsequent PCIs: patient presented with VT arrest, suspicious for ischemic etiology. He was started on IV amiodarone without clear recurrence though does continue to have intermittent PVCs. Amiodarone rate reduced 10/24/20 due to bradycardia concerns. Cr continues to be a limiting factor to ischemic evaluation.  - Anticipate LHC when Cr improves - Continue IV amiodarone  - Continue aspirin and statin - Continue Bblocker  2. AoCKD stage 3b: Cr peaked at 3.89, down to 3.11 yesterday; still pending today. Felt to be 2/2 cardiac arrest/?ATN. Baseline Cr 1.5-1.9 - Await BMET today - Continue to avoid nephrotoxic agents  3. HTN: BP overall  stable, occasionally elevated - Continue carvedilol  4. HLD: LDL 120 09/03/20 - Continue atorvastatin     For questions or updates, please contact [place] Please consult [email or web address] for contact info under        Signed, Abigail Butts, PA-C  10/26/2020, 8:18 AM

## 2020-10-27 DIAGNOSIS — I214 Non-ST elevation (NSTEMI) myocardial infarction: Secondary | ICD-10-CM | POA: Diagnosis not present

## 2020-10-27 LAB — BASIC METABOLIC PANEL
Anion gap: 9 (ref 5–15)
BUN: 34 mg/dL — ABNORMAL HIGH (ref 8–23)
CO2: 17 mmol/L — ABNORMAL LOW (ref 22–32)
Calcium: 8.1 mg/dL — ABNORMAL LOW (ref 8.9–10.3)
Chloride: 106 mmol/L (ref 98–111)
Creatinine, Ser: 2.53 mg/dL — ABNORMAL HIGH (ref 0.61–1.24)
GFR, Estimated: 28 mL/min — ABNORMAL LOW (ref >60)
Glucose, Bld: 137 mg/dL — ABNORMAL HIGH (ref 70–99)
Potassium: 4.4 mmol/L (ref 3.5–5.1)
Sodium: 132 mmol/L — ABNORMAL LOW (ref 135–145)

## 2020-10-27 LAB — GLUCOSE, CAPILLARY
Glucose-Capillary: 126 mg/dL — ABNORMAL HIGH (ref 70–99)
Glucose-Capillary: 145 mg/dL — ABNORMAL HIGH (ref 70–99)
Glucose-Capillary: 134 mg/dL — ABNORMAL HIGH (ref 70–99)
Glucose-Capillary: 153 mg/dL — ABNORMAL HIGH (ref 70–99)

## 2020-10-27 LAB — CBC
HCT: 28.3 % — ABNORMAL LOW (ref 39.0–52.0)
Hemoglobin: 8.2 g/dL — ABNORMAL LOW (ref 13.0–17.0)
MCH: 23.2 pg — ABNORMAL LOW (ref 26.0–34.0)
MCHC: 29 g/dL — ABNORMAL LOW (ref 30.0–36.0)
MCV: 79.9 fL — ABNORMAL LOW (ref 80.0–100.0)
Platelets: 218 10*3/uL (ref 150–400)
RBC: 3.54 MIL/uL — ABNORMAL LOW (ref 4.22–5.81)
RDW: 20.1 % — ABNORMAL HIGH (ref 11.5–15.5)
WBC: 7 10*3/uL (ref 4.0–10.5)
nRBC: 0 % (ref 0.0–0.2)

## 2020-10-27 LAB — HEPARIN LEVEL (UNFRACTIONATED): Heparin Unfractionated: 0.41 IU/mL (ref 0.30–0.70)

## 2020-10-27 LAB — MAGNESIUM: Magnesium: 1.9 mg/dL (ref 1.7–2.4)

## 2020-10-27 MED ORDER — MAGNESIUM SULFATE 2 GM/50ML IV SOLN
2.0000 g | Freq: Once | INTRAVENOUS | Status: AC
  Administered 2020-10-27: 2 g via INTRAVENOUS
  Filled 2020-10-27: qty 50

## 2020-10-27 MED ORDER — SODIUM BICARBONATE 650 MG PO TABS
650.0000 mg | ORAL_TABLET | Freq: Two times a day (BID) | ORAL | Status: DC
  Administered 2020-10-27 – 2020-11-02 (×13): 650 mg via ORAL
  Filled 2020-10-27 (×14): qty 1

## 2020-10-27 NOTE — Progress Notes (Addendum)
HD#12 SUBJECTIVE:  Patient Summary: Darrell [name] is a 65 y.o. 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 tach/PEA arrest with successful ROSC, with plans for heart cath pending improvement of renal function secondary to ATN.  Patient had episode of fever up to 103.1 F on 6/26, has remained afebrile since then.  Received 3 days of ceftriaxone and transition to oral Keflex for 4 additional days.  Overnight Events: Events overnight, patient's heart rate has remained in the 50s on IV amiodarone.  Interim History: Mr. Pilger reports he is feeling better today. He denies any acute complaints at this time.   OBJECTIVE:  Vital Signs: Vitals:   10/26/20 1749 10/26/20 2026 10/26/20 2338 10/27/20 0343  BP: (!) 141/55 (!) 148/81 (!) 156/75 (!) 148/80  Pulse: (!) 57 60 60 60  Resp:  17 18 18   Temp:  98.8 F (37.1 C) 99 F (37.2 C) 98.8 F (37.1 C)  TempSrc:  Oral Oral Oral  SpO2:  97% 99% 96%  Weight:      Height:       Supplemental O2: Nasal Cannula SpO2: 96 % O2 Flow Rate (L/min): 0 L/min FiO2 (%): 30 %  Filed Weights   10/24/20 0401 10/25/20 0322 10/26/20 0013  Weight: 105.7 kg 106 kg 104.4 kg     Intake/Output Summary (Last 24 hours) at 10/27/2020 0534 Last data filed at 10/26/2020 2204 Gross per 24 hour  Intake 1119.73 ml  Output 1200 ml  Net -80.27 ml   Net IO Since Admission: 1,092.49 mL [10/27/20 0534]  Physical Exam: General: Well developed, well nourished HENT: NCAT Eyes: No scleral icterus, conjunctiva clear CV: No murmurs, normal rate and rhythm Pulm: CTAB, bowel sounds present GI: no tenderness, bowel sounds present MSK: Pitting edema in lower extremities bilaterally, normal bulk and tone Skin: Warm and dry Psych: Flat affect and depressed mood  Patient Lines/Drains/Airways Status     Active  Line/Drains/Airways     Name Placement date Placement time Site Days   CVC Triple Lumen 10/16/20 Left Subclavian 10/16/20  1039  -- 11   External Urinary Catheter 10/23/20  1128  --  4            Pertinent Labs: CBC Latest Ref Rng & Units 10/27/2020 10/26/2020 10/25/2020  WBC 4.0 - 10.5 K/uL 7.0 7.1 8.7  Hemoglobin 13.0 - 17.0 g/dL 8.2(L) 8.8(L) 8.1(L)  Hematocrit 39.0 - 52.0 % 28.3(L) 28.7(L) 27.2(L)  Platelets 150 - 400 K/uL 218 207 189    CMP Latest Ref Rng & Units 10/27/2020 10/26/2020 10/25/2020  Glucose 70 - 99 mg/dL 10/27/2020) 811(B) 147(W)  BUN 8 - 23 mg/dL 295(A) 21(H) 08(M)  Creatinine 0.61 - 1.24 mg/dL 57(Q) 4.69(G) 2.95(M)  Sodium 135 - 145 mmol/L 132(L) 135 131(L)  Potassium 3.5 - 5.1 mmol/L 4.4 3.9 3.4(L)  Chloride 98 - 111 mmol/L 106 107 101  CO2 22 - 32 mmol/L 17(L) 18(L) 18(L)  Calcium 8.9 - 10.3 mg/dL 8.1(L) 8.3(L) 8.0(L)  Total Protein 6.5 - 8.1 g/dL - - -  Total Bilirubin 0.3 - 1.2 mg/dL - - -  Alkaline Phos 38 - 126 U/L - - -  AST 15 - 41 U/L - - -  ALT 0 - 44 U/L - - -    Recent Labs    10/26/20 1616 10/26/20 2029 10/26/20 2341  GLUCAP 164* 150*  161*     Pertinent Imaging: No results found.  ASSESSMENT/PLAN:  Assessment: Principal Problem:   NSTEMI (non-ST elevated myocardial infarction) (HCC) Active Problems:   Type 2 diabetes mellitus without complication, without long-term current use of insulin (HCC)   Adjustment disorder with depressed mood   Coronary atherosclerosis, s/p CABG in 2009    History of noncompliance with medical treatment, presenting hazards to health   AKI (acute kidney injury) (HCC)   Diabetes mellitus type 2 in nonobese (HCC)   Anemia   Chest pain   VT (ventricular tachycardia) (HCC)   Cardiac arrest (HCC)  Darrell [name] is a 65 y.o. with a pertinent PMH of ED status post CABG(2009) and PCI's, chronic systolic heart failure, daily 3A, hypertension type 2 diabetes, HLD admitted September 18 with NSTEMI with subsequent  V. tach/PEA arrest with successful ROSC, with plans for heart cath pending improvement of renal function secondary to ATN.  Patient had episode of fever up to 103.1 F on 6/26, has remained afebrile since then.  Received 3 days of ceftriaxone and transition to oral Keflex for 4 additional days.  Plan: #NSTEMI #V. tach arrest #AKI on chronic kidney disease stage III Creatinine continues to improve from 2.77--> 2.53 today. Patient will likely be able to get cardiac catheterization sometime in the next week if creatinine continues to improve.   -Continue IV amiodarone per cardiology for ventricular ectopy -Continue aspirin and statin -Continue amlodipine 5 mg qd and carvedilol 6.25 mg  BID   #Hyponatremia Sodium at 132 this am. CO2 of 17.   -Sodium bicarb supplementation 650 mg BID  #Urinary tract infection Urinary culture grew E. coli.  Patient received 3 days of ceftriaxone, then transitioned to oral Keflex to 250 mg twice daily for 4 additional days. -Discontinued ceftriaxone -On day 1/4 for Keflex   #Iron deficiency anemia Today Hgb from 8.8 to 8.2. Patient received 1 unit of RBCs two days ago.  Hemoglobin improved from 8.1-8.8 today. Per Loel Ro equation, Total iron deficit at 801 mg.  Following Unit of blood deficit remains at around 600 mg.  Patient currently being treated for UTI, will likely benefit from iron repletion following discharge.  He will also need further work-up for his iron deficiency anemia following discharge.  Best Practice: Diet: Cardiac diet IVF: Fluids: none  VTE: heparin per pharmacy Code: Full AB: Keflex Therapy Recs: Home Health, DME: bedside commode Family Contact: updated his wife, [name] by phone DISPO: Anticipated discharge pending cardiac catheretization and medical stability.   Signature: Rudene Christians, D.O. Internal Medicine Resident, PGY-1 [facility]  Pager: [phone] 5:34 AM, 10/27/2020   Please contact the on  call pager after 5 pm and on weekends at [phone].

## 2020-10-27 NOTE — Care Management Important Message (Signed)
Important Message  Patient Details  Name: Darrell [name] MRN: [id number] Date of Birth: 1955/02/04   Medicare Important Message Given:  Yes     Renie Ora 10/27/2020, 9:51 AM

## 2020-10-27 NOTE — Progress Notes (Signed)
Brief cardiology note: Please see comments from yesterday. Cardiology will follow peripherally as Cr trending down. Anticipate possible cath next week based on Cr trajectory. Would continue IV amiodarone as this is suppressing his ventricular ectopy well. OK to have heart rates in the 50s, would avoid holding beta blocker or amiodarone if possible given his cardiac arrest this admission.  Please contact cardiology over the weekend if new questions arise. We will follow up next week.  Jodelle Red, MD, PhD, [facility]  [facility]  [place]  [facility] & V[facility] at [facility] at [facility] [address], [address] [place], [place] [address] [phone]

## 2020-10-27 NOTE — Progress Notes (Signed)
PT Cancellation Note  Patient Details Name: Darrell [name] MRN: [id number] DOB: [birth year]   Cancelled Treatment:    Reason Eval/Treat Not Completed: Patient declined participation despite max encouragement. Patient reports, "I'm tired of working with therapy... I just want to go home." Educ on importance of mobility in order to return home safely, pt continues to decline and stares blankly away from therapist requiring max cues to actually respond in conversation. Notified RN who will attempt to encourage participation. Will follow-up as schedule permits and pt agreeable to participate.  Ina Homes, PT, DPT Acute Rehabilitation Services  Pager [phone] Office [phone]  Malachy Chamber 10/27/2020, 1:22 PM

## 2020-10-27 NOTE — Progress Notes (Signed)
ANTICOAGULATION CONSULT NOTE - Consult  Pharmacy Consult for IV Heparin Indication: chest pain/ACS  No Known Allergies  Patient Measurements: Height: _0  (185.4 cm) Weight: 104.4 kg (230 lb 2.6 oz) IBW/kg (Calculated) : 79.9  Heparin Dosing Weight: 98 kg  Vital Signs: Temp: 98.8 F (37.1 C) (09/30 0343) Temp Source: Oral (09/30 0343) BP: 148/80 (09/30 0343) Pulse Rate: 60 (09/30 0343)  Labs: Recent Labs    10/25/20 0500 10/25/20 1800 10/26/20 0726 10/26/20 1930 10/27/20 0117  HGB 8.1*  --  8.8*  --  8.2*  HCT 27.2*  --  28.7*  --  28.3*  PLT 189  --  207  --  218  HEPARINUNFRC 0.21*   < > 0.56 0.52 0.41  CREATININE 3.07*  --  2.77*  --  2.53*   < > = values in this interval not displayed.    Estimated Creatinine Clearance: 37.4 mL/min (A) (by C-G formula based on SCr of 2.53 mg/dL (H)).   Medications:  Scheduled:   amLODipine  5 mg Oral Daily   aspirin EC  81 mg Oral Daily   atorvastatin  80 mg Oral QPM   carvedilol  6.25 mg Oral BID WC   cephALEXin  250 mg Oral BID   chlorhexidine gluconate (MEDLINE KIT)  15 mL Mouth Rinse BID   Chlorhexidine Gluconate Cloth  6 each Topical Daily   insulin aspart  0-15 Units Subcutaneous TID WC   pantoprazole  40 mg Oral Daily   sodium chloride flush  10-40 mL Intracatheter Q12H   Infusions:   amiodarone 30 mg/hr (10/27/20 0540)   heparin 1,750 Units/hr (10/27/20 0542)   magnesium sulfate bolus IVPB 2 g (10/27/20 0855)    Assessment: 65 yr old man with recent NSTEMI (declined cardiac cath at that time) presented with CP, elevated troponins. Pt was on heparin for planned 48 hr due to refusal of cath, now S/P VF arrest; heparin continued while pending possible change in cath plans. Awaiting cath (pt now agreeable), but Scr still elevated (trending down: 3.89 > 2.77).   Heparin level at goal @ 0.41. Hgb low but stable @ 8.2.  No s/sx bleeding per chart review. Will continue at current rate.   Goal of Therapy:  Heparin  level 0.3-0.5 units/ml (no boluses) Monitor platelets by anticoagulation protocol: Yes   Plan:  Continue heparin infusion at 1750 units/hr Check heparin level daily while on heparin Continue to monitor H&H and platelets   Thank you for allowing pharmacy to be a part of this patient's care.  Ardyth Harps, PharmD Clinical Pharmacist

## 2020-10-28 LAB — CULTURE, BLOOD (ROUTINE X 2)
Culture: NO GROWTH
Culture: NO GROWTH
Special Requests: ADEQUATE
Special Requests: ADEQUATE

## 2020-10-28 LAB — CBC
HCT: 29.2 % — ABNORMAL LOW (ref 39.0–52.0)
Hemoglobin: 8.5 g/dL — ABNORMAL LOW (ref 13.0–17.0)
MCH: 23.3 pg — ABNORMAL LOW (ref 26.0–34.0)
MCHC: 29.1 g/dL — ABNORMAL LOW (ref 30.0–36.0)
MCV: 80 fL (ref 80.0–100.0)
Platelets: 214 10*3/uL (ref 150–400)
RBC: 3.65 MIL/uL — ABNORMAL LOW (ref 4.22–5.81)
RDW: 20.2 % — ABNORMAL HIGH (ref 11.5–15.5)
WBC: 7.5 10*3/uL (ref 4.0–10.5)
nRBC: 0 % (ref 0.0–0.2)

## 2020-10-28 LAB — MAGNESIUM: Magnesium: 2 mg/dL (ref 1.7–2.4)

## 2020-10-28 LAB — BASIC METABOLIC PANEL
Anion gap: 7 (ref 5–15)
BUN: 28 mg/dL — ABNORMAL HIGH (ref 8–23)
CO2: 20 mmol/L — ABNORMAL LOW (ref 22–32)
Calcium: 8 mg/dL — ABNORMAL LOW (ref 8.9–10.3)
Chloride: 105 mmol/L (ref 98–111)
Creatinine, Ser: 2.21 mg/dL — ABNORMAL HIGH (ref 0.61–1.24)
GFR, Estimated: 32 mL/min — ABNORMAL LOW (ref >60)
Glucose, Bld: 138 mg/dL — ABNORMAL HIGH (ref 70–99)
Potassium: 4.5 mmol/L (ref 3.5–5.1)
Sodium: 132 mmol/L — ABNORMAL LOW (ref 135–145)

## 2020-10-28 LAB — GLUCOSE, CAPILLARY
Glucose-Capillary: 115 mg/dL — ABNORMAL HIGH (ref 70–99)
Glucose-Capillary: 114 mg/dL — ABNORMAL HIGH (ref 70–99)
Glucose-Capillary: 148 mg/dL — ABNORMAL HIGH (ref 70–99)
Glucose-Capillary: 154 mg/dL — ABNORMAL HIGH (ref 70–99)

## 2020-10-28 LAB — HEPARIN LEVEL (UNFRACTIONATED): Heparin Unfractionated: 0.39 IU/mL (ref 0.30–0.70)

## 2020-10-28 MED ORDER — HEPARIN SODIUM (PORCINE) 5000 UNIT/ML IJ SOLN
5000.0000 [IU] | Freq: Three times a day (TID) | INTRAMUSCULAR | Status: DC
  Administered 2020-10-28 – 2020-11-02 (×14): 5000 [IU] via SUBCUTANEOUS
  Filled 2020-10-28 (×14): qty 1

## 2020-10-28 NOTE — Progress Notes (Signed)
HD#13 SUBJECTIVE:  Patient Summary: Darrell [name] is a 65 y.o. 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 tach/PEA arrest secondary to obstructive CAD.   Overnight Events: Remains bradycardic, but hemodynamically stable.   Interim History:  No acute complaints this AM. He is resting comfortably in bed.   OBJECTIVE:  Vital Signs: Vitals:   10/27/20 1147 10/27/20 1617 10/27/20 2305 10/28/20 0300  BP: (!) 172/82 (!) 161/87 (!) 142/75 (!) 144/69  Pulse: 64 61 60 60  Resp: 20 20 18 18   Temp: 98.8 F (37.1 C) 98.6 F (37 C) 98 F (36.7 C) 98.5 F (36.9 C)  TempSrc: Oral Oral Oral Oral  SpO2: 100% 99% 98% 100%  Weight:    104.4 kg  Height:        Intake/Output Summary (Last 24 hours) at 10/28/2020 0703 Last data filed at 10/27/2020 1618 Gross per 24 hour  Intake 777.65 ml  Output 1450 ml  Net -672.35 ml    Net IO Since Admission: 690.99 mL [10/28/20 0703]  Physical Exam: General: Well developed, well nourished HENT: NCAT Eyes: No scleral icterus, conjunctiva clear CV: No murmurs, normal rate and rhythm GI: no tenderness, bowel sounds present Skin: Warm and dry Neuro: Alert and oriented x 3.  Psych: Flat affect with latency in speech.    ASSESSMENT/PLAN:  Assessment: Principal Problem:   NSTEMI (non-ST elevated myocardial infarction) (HCC) Active Problems:   Type 2 diabetes mellitus without complication, without long-term current use of insulin (HCC)   Adjustment disorder with depressed mood   Coronary atherosclerosis, s/p CABG in 2009    History of noncompliance with medical treatment, presenting hazards to health   AKI (acute kidney injury) (HCC)   Diabetes mellitus type 2 in nonobese (HCC)   Anemia   Chest pain   VT (ventricular tachycardia)   Cardiac arrest (HCC)  Darrell [name] is a 65 y.o. 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 tach/PEA arrest with successful ROSC, with plans for heart cath pending improvement of renal function secondary to ATN.  Patient had episode of fever up to 103.1 F on 6/26, has remained afebrile since then.  Received 3 days of ceftriaxone and transition to oral Keflex for 4 additional days.  Plan:  # NSTEMI # V. tach arrest In the setting of known obstructive CAD.   - Continue IV amiodarone per cardiology for ventricular ectopy - Continue aspirin and statin - Continue amlodipine 5 mg qd and carvedilol 6.25 mg BID - Plan for cath next week pending further renal recovery - Discontinue heparin gtt   # AKI on chronic kidney disease stage III # Hyponatremia # NAGMA Secondary to ATN. Slow improvement in renal function  - Strict in/out - Trend renal indexes - Continue sodium bicarb 650 mg BID  # E. Coli UTI Pan-sensitive. Received Ceftriaxone for 2 days prior to transition to Keflex.   - Continue Keflex, Day 2/4   # Iron deficiency anemia S/p 2 units on 9/19 and 1 unit on 9/27. Hemoglobin stable at this time.  - Continue to trend hemoglobin intermittently   # Behavioral Abnormalities  Patient's affect is quite flat with latency in speech. Uncertain if this is patient's baseline. Will need to establish  baseline cognitive function. Question if there was any anoxic brain injury after arrest.  - Will discuss with wife tomorrow   Best Practice: Diet: Cardiac diet IVF: Fluids: none  VTE: Subcutaneous heparin  Code: Full AB: Keflex Therapy Recs: Home Health, DME: bedside commode  DISPO: Anticipated discharge pending cardiac catheretization and medical stability.   Signature: Dr. Verdene Lennert Internal Medicine PGY-3  Pager: [phone] After 5pm on weekdays and 1pm on weekends: On Call pager  [phone]  10/28/2020, 7:11 AM

## 2020-10-28 NOTE — Progress Notes (Signed)
ANTICOAGULATION CONSULT NOTE - Consult  Pharmacy Consult for IV Heparin Indication: chest pain/ACS  No Known Allergies  Patient Measurements: Height: 6\' 1"  (185.4 cm) Weight: 104.4 kg (230 lb 2.6 oz) IBW/kg (Calculated) : 79.9  Heparin Dosing Weight: 98 kg  Vital Signs: Temp: 98.6 F (37 C) (10/01 0804) Temp Source: Oral (10/01 0804) BP: 151/82 (10/01 0804) Pulse Rate: 56 (10/01 0804)  Labs: Recent Labs    10/26/20 0726 10/26/20 1930 10/27/20 0117 10/28/20 0430  HGB 8.8*  --  8.2* 8.5*  HCT 28.7*  --  28.3* 29.2*  PLT 207  --  218 214  HEPARINUNFRC 0.56 0.52 0.41 0.39  CREATININE 2.77*  --  2.53* 2.21*     Estimated Creatinine Clearance: 42.8 mL/min (A) (by C-G formula based on SCr of 2.21 mg/dL (H)).   Medications:  Scheduled:   amLODipine  5 mg Oral Daily   aspirin EC  81 mg Oral Daily   atorvastatin  80 mg Oral QPM   carvedilol  6.25 mg Oral BID WC   cephALEXin  250 mg Oral BID   chlorhexidine gluconate (MEDLINE KIT)  15 mL Mouth Rinse BID   Chlorhexidine Gluconate Cloth  6 each Topical Daily   insulin aspart  0-15 Units Subcutaneous TID WC   pantoprazole  40 mg Oral Daily   sodium bicarbonate  650 mg Oral BID   sodium chloride flush  10-40 mL Intracatheter Q12H   Infusions:   amiodarone 30 mg/hr (10/28/20 0606)   heparin 1,750 Units/hr (10/27/20 1942)    Assessment: 65 yr old man with recent NSTEMI (declined cardiac cath at that time) presented with CP, elevated troponins. Pt was on heparin for planned 48 hr due to refusal of cath, now S/P VF arrest; heparin continued while pending possible change in cath plans. Awaiting cath (pt now agreeable), but Scr still elevated (trending down: 3.89 > 2.21).   Heparin level at goal @ 0.39. Hgb low but stable @ 8.5.  No s/sx bleeding per chart review. Will continue at current rate.   Goal of Therapy:  Heparin level 0.3-0.5 units/ml (no boluses) Monitor platelets by anticoagulation protocol: Yes   Plan:   Continue heparin infusion at 1750 units/hr Check heparin level daily while on heparin Continue to monitor H&H and platelets   Thank you for including pharmacy in the care of this patient.  Zenaida Deed, PharmD PGY1 Acute Care Pharmacy Resident  Phone: [phone] 10/28/2020  9:37 AM  Please check [email or web address] for unit-specific pharmacy phone numbers.

## 2020-10-29 LAB — GLUCOSE, CAPILLARY
Glucose-Capillary: 110 mg/dL — ABNORMAL HIGH (ref 70–99)
Glucose-Capillary: 130 mg/dL — ABNORMAL HIGH (ref 70–99)
Glucose-Capillary: 114 mg/dL — ABNORMAL HIGH (ref 70–99)
Glucose-Capillary: 143 mg/dL — ABNORMAL HIGH (ref 70–99)

## 2020-10-29 NOTE — Progress Notes (Addendum)
Darrell [name] is a 65 y.o. with a pertinent PMH of CAD status post CABG (2009) and PCI's, chronic systolic heart failure, CKD 3A, hypertension, type 2 diabetes, HLD admitted September 18 with NSTEMI with subsequent V. tach/PEA arrest with successful ROSC, with plans for heart cath pending improvement of renal function secondary to acute tubular necrosis.  Patient had episode of fever up to 103.1 F on 09/26, has remained afebrile since.  Received 3 days of ceftriaxone and transitioning to oral Keflex for 4 additional days. Day 3/4.   Subjective:  Patient resting comfortably. Denied any acute complaints. Denied headache, chest pain, or back pain. States he was having normal bowel and bladder function. No acute events overnight.   Objective:  Vital signs in last 24 hours: Vitals:   10/28/20 1608 10/28/20 2035 10/28/20 2326 10/29/20 0320  BP: (!) 143/78 137/81 (!) 144/69 138/64  Pulse: (!) 56 (!) 58 (!) 56 (!) 58  Resp: 18 19 20 20   Temp: 98.6 F (37 C) 98.6 F (37 C) 98.6 F (37 C) 98.6 F (37 C)  TempSrc: Oral Oral Oral Oral  SpO2: 96% 99% 99% 99%  Weight:      Height:       Physical Exam Constitutional:      General: He is not in acute distress.    Appearance: Normal appearance. He is not ill-appearing.  HENT:     Head: Normocephalic and atraumatic.     Right Ear: External ear normal.     Left Ear: External ear normal.     Nose: Nose normal.     Mouth/Throat:     Mouth: Mucous membranes are moist.     Pharynx: Oropharynx is clear. No oropharyngeal exudate or posterior oropharyngeal erythema.  Eyes:     Extraocular Movements: Extraocular movements intact.     Conjunctiva/sclera: Conjunctivae normal.     Pupils: Pupils are equal, round, and reactive to light.  Cardiovascular:     Rate and Rhythm: Normal rate and regular rhythm.     Pulses: Normal pulses.     Heart sounds: Normal heart sounds. No murmur heard.   No friction rub.  Pulmonary:     Effort: Pulmonary effort  is normal.     Breath sounds: Normal breath sounds.  Abdominal:     General: Bowel sounds are normal.     Palpations: Abdomen is soft.  Musculoskeletal:        General: No swelling, tenderness, deformity or signs of injury. Normal range of motion.     Cervical back: Normal range of motion and neck supple.     Right lower leg: No edema.     Left lower leg: No edema.  Skin:    Capillary Refill: Capillary refill takes less than 2 seconds.     Coloration: Skin is not jaundiced or pale.     Findings: No erythema, lesion or rash.  Neurological:     General: No focal deficit present.     Mental Status: He is alert and oriented to person, place, and time.  Psychiatric:        Behavior: Behavior normal.     Comments: Has flat affect. Will inquire about baseline.     Assessment/Plan:  Principal Problem:   NSTEMI (non-ST elevated myocardial infarction) [facility]) Active Problems:   Type 2 diabetes mellitus without complication, without long-term current use of insulin (HCC)   Adjustment disorder with depressed mood   Coronary atherosclerosis, s/p CABG in 2009    History  of noncompliance with medical treatment, presenting hazards to health   AKI (acute kidney injury) (HCC)   Diabetes mellitus type 2 in nonobese (HCC)   Anemia   Chest pain   VT (ventricular tachycardia)   Cardiac arrest [facility])  Darrell [name] is a 65 y.o. with a pertinent PMH of CAD status post CABG (2009) and PCI's, chronic systolic heart failure, CKD 3A, hypertension, type 2 diabetes, HLD admitted September 18 with NSTEMI with subsequent V. tach/PEA arrest with successful ROSC, with plans for heart cath pending improvement of renal function secondary to acute tubular necrosis.  Patient had episode of fever up to 103.1 F on 09/26, has remained afebrile since.  Received 3 days of ceftriaxone and transitioning to oral Keflex for 4 additional days. Day 3/4.   NSTEMI V.Tach arrest - Continue IV amiodarone per cardiology for  ventricular ectopy - Continue aspirin and lipitor 80 mg - Continue amlodipine 5 mg qd and carvedilol 6.25 mg BID - Plan for cath next week pending further renal recovery  AKI on CKD III Hyponatremia NAGMA -Secondary to ATN with slow improvement. Strict I/O. Net negative 1983.8 since yesterday. Will check kidney function tomorrow am.  -Sodium stable at 132, CO2 improved from 17 to 20. -Continue sodium Bicarb 650 mg BID  E. Coli UTI -s/p 2 days of Ceftriaxone on day 3/4 of keflex 250g BID  IDA -s/p 2 units on 09/19 and 1 unit on 09/27. Hgb stable with last reading 10/28/20 showing 8.5. Continue to monitor  DVT prophx: Sub Q hep 5000 units Diet: Cardiac Bowel: PRN Code:Full Abx: oral keflex  Prior to Admission Living Arrangement: Home Anticipated Discharge Location: Home Barriers to Discharge: Cardiac workup Dispo: Anticipated discharge in approximately 5-7 day(s).   Darrell Abbot, MD 10/29/2020, 7:49 AM Pager: [phone] After 5pm on weekdays and 1pm on weekends:

## 2020-10-30 ENCOUNTER — Inpatient Hospital Stay (HOSPITAL_COMMUNITY): Payer: Medicare Other

## 2020-10-30 LAB — CBC WITH DIFFERENTIAL/PLATELET
Abs Immature Granulocytes: 0.12 10*3/uL — ABNORMAL HIGH (ref 0.00–0.07)
Basophils Absolute: 0 10*3/uL (ref 0.0–0.1)
Basophils Relative: 0 %
Eosinophils Absolute: 0.2 10*3/uL (ref 0.0–0.5)
Eosinophils Relative: 2 %
HCT: 28.6 % — ABNORMAL LOW (ref 39.0–52.0)
Hemoglobin: 8.1 g/dL — ABNORMAL LOW (ref 13.0–17.0)
Immature Granulocytes: 2 %
Lymphocytes Relative: 16 %
Lymphs Abs: 1.3 10*3/uL (ref 0.7–4.0)
MCH: 22.9 pg — ABNORMAL LOW (ref 26.0–34.0)
MCHC: 28.3 g/dL — ABNORMAL LOW (ref 30.0–36.0)
MCV: 80.8 fL (ref 80.0–100.0)
Monocytes Absolute: 0.7 10*3/uL (ref 0.1–1.0)
Monocytes Relative: 8 %
Neutro Abs: 5.7 10*3/uL (ref 1.7–7.7)
Neutrophils Relative %: 72 %
Platelets: 269 10*3/uL (ref 150–400)
RBC: 3.54 MIL/uL — ABNORMAL LOW (ref 4.22–5.81)
RDW: 20.4 % — ABNORMAL HIGH (ref 11.5–15.5)
WBC: 7.9 10*3/uL (ref 4.0–10.5)
nRBC: 0 % (ref 0.0–0.2)

## 2020-10-30 LAB — BASIC METABOLIC PANEL
Anion gap: 7 (ref 5–15)
BUN: 26 mg/dL — ABNORMAL HIGH (ref 8–23)
CO2: 21 mmol/L — ABNORMAL LOW (ref 22–32)
Calcium: 8.2 mg/dL — ABNORMAL LOW (ref 8.9–10.3)
Chloride: 106 mmol/L (ref 98–111)
Creatinine, Ser: 2.02 mg/dL — ABNORMAL HIGH (ref 0.61–1.24)
GFR, Estimated: 36 mL/min — ABNORMAL LOW (ref >60)
Glucose, Bld: 116 mg/dL — ABNORMAL HIGH (ref 70–99)
Potassium: 4.8 mmol/L (ref 3.5–5.1)
Sodium: 134 mmol/L — ABNORMAL LOW (ref 135–145)

## 2020-10-30 LAB — GLUCOSE, CAPILLARY
Glucose-Capillary: 124 mg/dL — ABNORMAL HIGH (ref 70–99)
Glucose-Capillary: 127 mg/dL — ABNORMAL HIGH (ref 70–99)
Glucose-Capillary: 132 mg/dL — ABNORMAL HIGH (ref 70–99)
Glucose-Capillary: 107 mg/dL — ABNORMAL HIGH (ref 70–99)

## 2020-10-30 MED ORDER — FUROSEMIDE 10 MG/ML IJ SOLN
20.0000 mg | Freq: Once | INTRAMUSCULAR | Status: AC
  Administered 2020-10-30: 20 mg via INTRAVENOUS
  Filled 2020-10-30: qty 2

## 2020-10-30 NOTE — Progress Notes (Signed)
Discussed with MD leaving in patients central line due to amioderone running. Will discontinue this order until MD decides in the morning.

## 2020-10-30 NOTE — Progress Notes (Signed)
Patient up three times tonight and ambulate to the [facility] for a BM. He stated that he feels better than he was yesterday. We'll continue to monitor.

## 2020-10-30 NOTE — Progress Notes (Signed)
Occupational Therapy Treatment Patient Details Name: LENDELL GALLICK MRN: [id number] DOB: 1955/07/05 Today's Date: 10/30/2020   History of present illness Pt is a 65 y.o. male admitted 10/15/20 with c/o chest pain. Pt with NSTEMI/VT arrest on 9/19; ETT 9/19-9/20. Anticipate LHC when creatinine improves. PMH includes CHF, CABG (2009), NSTEMI (8/22), bedbug infestation, CKD, CAD, DM, HTN.   OT comments  Pt with slower progress towards OT goals due to self limiting behaviors. Pt received on BSC, able to complete remainder of toileting hygiene with min guard in standing. Attempted to engage pt in mobility to sink for hand hygiene & further gait attempts with PT. However, pt impulsively began pivoting back to bed via RW and declined any further activities. When attempted to engage in barriers to participation, pt with no verbal responses and minimal eye contact with therapists. Pt's wife present and attempting to encourage pt as well. Will continue to follow to maximize safety with ADLs/mobility within pt tolerance. However, if inconsistent participation continues, will consider signing off at acute level.    Recommendations for follow up therapy are one component of a multi-disciplinary discharge planning process, led by the attending physician.  Recommendations may be updated based on patient status, additional functional criteria and insurance authorization.    Follow Up Recommendations  SNF    Equipment Recommendations  Wheelchair (measurements OT);Wheelchair cushion (measurements OT)    Recommendations for Other Services      Precautions / Restrictions Precautions Precautions: Fall Restrictions Weight Bearing Restrictions: No       Mobility Bed Mobility Overal bed mobility: Needs Assistance Bed Mobility: Sit to Supine       Sit to supine: Supervision   General bed mobility comments: cues for safety with line mgmt    Transfers Overall transfer level: Needs  assistance Equipment used: Rolling walker (2 wheeled) Transfers: Sit to/from [employer] Sit to Stand: Min guard Stand pivot transfers: Min assist       General transfer comment: min guard for safety in power up from [facility], noted to pull on RW. impulsively began pivoting back to bed from [facility] though encouraged further mobility. assist for balance and line mgmt due to pt quick pace    Balance Overall balance assessment: Needs assistance Sitting-balance support: No upper extremity supported;Feet supported Sitting balance-Leahy Scale: Good     Standing balance support: During functional activity;Bilateral upper extremity supported Standing balance-Leahy Scale: Poor Standing balance comment: reliant on one UE support in standing with RW                           ADL either performed or assessed with clinical judgement   ADL Overall ADL's : Needs assistance/impaired Eating/Feeding: Independent;Bed level Eating/Feeding Details (indicate cue type and reason): able to open containers and prep coffee Grooming: Set up;Bed level;Wash/dry face Grooming Details (indicate cue type and reason): via hand sanitizer, pt declined to take steps to sink for hand hygiene                 Toilet Transfer: Minimal assistance;RW;Stand-pivot;BSC Toilet Transfer Details (indicate cue type and reason): received on BSC, able to stand from [facility] though pulls on RW. Attempted to engage pt to walk to sink for hand hygiene but impulsively began pivoting back to bed with RW, no regards for lines Toileting- Clothing Manipulation and Hygiene: Min guard;Sit to/from stand Toileting - Clothing Manipulation Details (indicate cue type and reason): able to perform posterior hygiene in standing,  one hand on RW       General ADL Comments: Pt remains self limiting with flat affected (noted hx of mood disorder), impulsive with movements and does not respond to encouragement for tasks. Noted  theraband missing from room, plan to provide new handout and theraband. Wife present, educated to encourage pt to walk to [facility] (placing further away), meals EOB or up in chair     Vision   Vision Assessment?: No apparent visual deficits   Perception     Praxis      Cognition Arousal/Alertness: Awake/alert Behavior During Therapy: Flat affect;Impulsive Overall Cognitive Status: No family/caregiver present to determine baseline cognitive functioning                                 General Comments: pt with poor safety awareness; slow processing, flat affect, sometimes does not answer questions or very vague. Wife present during session and reports pt not typically this non-responsive or shut down        Exercises     Shoulder Instructions       General Comments Wife present and attempting to encourage pt    Pertinent Vitals/ Pain       Pain Assessment: Faces Faces Pain Scale: No hurt Pain Intervention(s): Monitored during session  Home Living                                          Prior Functioning/Environment              Frequency  Min 2X/week        Progress Toward Goals  OT Goals(current goals can now be found in the care plan section)  Progress towards OT goals: OT to reassess next treatment  Acute Rehab OT Goals Patient Stated Goal: go home OT Goal Formulation: With patient Time For Goal Achievement: 11/02/20 Potential to Achieve Goals: Good ADL Goals Pt Will Perform Grooming: with min guard assist;standing Pt Will Perform Lower Body Bathing: with min assist;sit to/from stand Pt Will Transfer to Toilet: with supervision;stand pivot transfer;bedside commode Pt/caregiver will Perform Home Exercise Program: Increased strength;Both right and left upper extremity;With theraband;With Supervision;With written HEP provided Additional ADL Goal #1: Pt to increase activity tolerance to > 5 minutes during ADL tasks  Plan  Discharge plan remains appropriate;Equipment recommendations need to be updated    Co-evaluation    PT/OT/SLP Co-Evaluation/Treatment: Yes Reason for Co-Treatment: Necessary to address cognition/behavior during functional activity   OT goals addressed during session: ADL's and self-care      AM-PAC OT "6 Clicks" Daily Activity     Outcome Measure   Help from another person eating meals?: None Help from another person taking care of personal grooming?: A Little Help from another person toileting, which includes using toliet, bedpan, or urinal?: A Little Help from another person bathing (including washing, rinsing, drying)?: A Lot Help from another person to put on and taking off regular upper body clothing?: A Little Help from another person to put on and taking off regular lower body clothing?: A Lot 6 Click Score: 17    End of Session Equipment Utilized During Treatment: Gait belt;Rolling walker  OT Visit Diagnosis: Unsteadiness on feet (R26.81);Other abnormalities of gait and mobility (R26.89);Muscle weakness (generalized) (M62.81)   Activity Tolerance Other (comment) (self limiting)   Patient Left  in bed;with call bell/phone within reach;with bed alarm set;with family/visitor present   Nurse Communication Mobility status        Time: 4097-3532 OT Time Calculation (min): 23 min  Charges: OT General Charges $OT Visit: 1 Visit OT Treatments $Self Care/Home Management : 8-22 mins  Bradd Canary, OTR/L Acute Rehab Services Office: [phone]   Lorre Munroe 10/30/2020, 8:44 AM

## 2020-10-30 NOTE — Progress Notes (Signed)
Darrell [name] is a 65 y.o. with a pertinent PMH of CAD status post CABG (2009) and PCI's, chronic systolic heart failure, CKD 3A, hypertension, type 2 diabetes, HLD admitted September 18 with NSTEMI with subsequent V. tach/PEA arrest with successful ROSC, with plans for heart cath pending improvement of renal function secondary to acute tubular necrosis.  Patient had episode of fever up to 103.1 F on 09/26, has remained afebrile since.  Received 3 days of ceftriaxone and transitioning to oral Keflex for 4 additional days. Day 4/4.   Subjective:  Mr. Grosso reports intermittent SOB last night. He is not experiencing at this time. He denies any other complaints at this time.   His wife is at bedside. She feels that his mood seems down but notes that he has only been a man of few words. She is concerned that he seemed shut off when PT wanted to work with him this AM.   Mr. Lafavor denies any feelings of confusion or difficulty thinking. Prior to admission, his favorite activities were fishing and bowling, but he has not done this in a while. In the last 6 months, he mostly plays video games as he has difficulty with mobilization at home due to stairs.   We discussed the plan for catheterization eventually.   Objective:  Vital signs in last 24 hours: Vitals:   10/29/20 2017 10/29/20 2300 10/30/20 0113 10/30/20 0311  BP: (!) 155/68 (!) 177/76  (!) 172/82  Pulse: (!) 55 (!) 57  64  Resp: 19 20  20   Temp: 98.4 F (36.9 C) (!) 97.3 F (36.3 C)  98.3 F (36.8 C)  TempSrc: Oral Oral  Oral  SpO2: 99% 95% 100% 95%  Weight:      Height:       Physical Exam Constitutional:      General: He is not in acute distress.    Appearance: He is well-developed.  HENT:     Head: Normocephalic and atraumatic.  Neck:     Vascular: JVD present.  Cardiovascular:     Rate and Rhythm: Normal rate and regular rhythm.     Pulses: Normal pulses.     Heart sounds: Normal heart sounds.  Pulmonary:      Effort: Pulmonary effort is normal.     Breath sounds: Rales (rales present in lower and middle lung fields) present.  Abdominal:     General: Bowel sounds are normal. There is no distension.     Palpations: Abdomen is soft.     Tenderness: There is no abdominal tenderness.  Musculoskeletal:        General: No tenderness. Normal range of motion.     Cervical back: Normal range of motion and neck supple.     Right lower leg: No edema.     Left lower leg: No edema.  Neurological:     General: No focal deficit present.     Mental Status: He is alert and oriented to person, place, and time. Mental status is at baseline.  Psychiatric:        Mood and Affect: Mood normal.        Behavior: Behavior normal.     Assessment/Plan:  Principal Problem:   NSTEMI (non-ST elevated myocardial infarction) (HCC) Active Problems:   Type 2 diabetes mellitus without complication, without long-term current use of insulin (HCC)   Adjustment disorder with depressed mood   Coronary atherosclerosis, s/p CABG in 2009    History of noncompliance with medical treatment, presenting  hazards to health   AKI (acute kidney injury) (HCC)   Diabetes mellitus type 2 in nonobese (HCC)   Anemia   Chest pain   VT (ventricular tachycardia)   Cardiac arrest [facility])  Darrell [name] is a 65 y.o. with a pertinent PMH of CAD status post CABG (2009) and PCI's, chronic systolic heart failure, CKD 3A, hypertension, type 2 diabetes, HLD admitted September 18 with NSTEMI with subsequent V. tach/PEA arrest with successful ROSC, with plans for heart cath pending improvement of renal function secondary to acute tubular necrosis.  Patient had episode of fever up to 103.1 F on 09/26 2/2 to UTI, has remained afebrile since.  Received 3 days of ceftriaxone and transitioning to oral Keflex for 4 additional days. Day 4/4.    NSTEMI V.Tach arrest HTN - Continue IV amiodarone per cardiology for ventricular ectopy - Continue aspirin  and lipitor 80 mg - Plan for cath 10/05 pending continues to be stable or improves.  -Patient hypertensive today at 172/82 but has been running lower this admission.  --Continue amlodipine 5 mg qd and carvedilol 6.25 mg BID --IV lasix 20 mg due to complaint of SOB and rales on physical exam.   AKI on CKD III Hyponatremia NAGMA -Secondary to ATN with slow improvement. Strict I/O. Daily weights. Net negative 1561.3 since yesterday. Kidney fxn improving with cr down to 2.02 from 2.21.  -Sodium improved to 134, CO2 improved to 21. -Continue sodium Bicarb 650 mg BID  E. Coli UTI -s/p 2 days of Ceftriaxone on day 4/4 of keflex 250g BID  IDA -s/p 2 units on 09/19 and 1 unit on 09/27. Hgb stable with last reading 10/30/20 showing 8.1. Continue to monitor  DVT prophx: Sub Q hep 5000 units Diet: Cardiac Bowel: PRN Code:Full Abx: oral keflex  Prior to Admission Living Arrangement: Home Anticipated Discharge Location: Home Barriers to Discharge: Cardiac workup Dispo: Anticipated discharge in approximately 5-7 day(s).   Darrell Abbot, MD 10/30/2020, 6:03 AM Pager: [phone] After 5pm on weekdays and 1pm on weekends:[phone]

## 2020-10-30 NOTE — Progress Notes (Signed)
   Cardiology following peripherally. Cr continues to improve, down to 2.0 today. If Cr remains stable or continues to trend down, anticipate he will be a candidate for cath on Wednesday 11/01/20. Will see patient tomorrow to assess readiness.   Beatriz Stallion, PA-C 10/30/20; 11:44 AM

## 2020-10-30 NOTE — Progress Notes (Signed)
Physical Therapy Treatment Patient Details Name: Darrell [name] MRN: [id number] DOB: [birth year] Today's Date: 10/30/2020   History of Present Illness Pt is a 65 y.o. male admitted 10/15/20 with c/o chest pain. Pt with NSTEMI/VT arrest on 9/19; ETT 9/19-9/20. Anticipate LHC when creatinine improves. PMH includes CHF, CABG (2009), NSTEMI (8/22), bedbug infestation, CKD, CAD, DM, HTN.   PT Comments    Pt continues to have limited participation and engagement in mobility. Pt received on BSC, only agreeable to stand for pericare then walk short distance back to bed, able to do so with min guard, requiring minA to control descent onto bed as pt impulsive with movement. Multiple attempts to engage pt in conversation and various mobility tasks; pt with minimal verbal responses and eye contact with therapists. Pt's wife present and attempting to encourage pt as well; wife reports that this is not pt's baseline cognition. Will continue to follow acutely to address established goals, but will consider signing off at acute level if pt does not desire to participate.    Recommendations for follow up therapy are one component of a multi-disciplinary discharge planning process, led by the attending physician.  Recommendations may be updated based on patient status, additional functional criteria and insurance authorization.  Follow Up Recommendations  Home health PT;Supervision/Assistance - 24 hour (declined SNF)     Equipment Recommendations  3in1 (PT)    Recommendations for Other Services       Precautions / Restrictions Precautions Precautions: Fall Restrictions Weight Bearing Restrictions: No     Mobility  Bed Mobility Overal bed mobility: Needs Assistance Bed Mobility: Sit to Supine       Sit to supine: Supervision   General bed mobility comments: cues for safety with line mgmt    Transfers Overall transfer level: Needs assistance Equipment used: Rolling walker (2  wheeled) Transfers: Sit to/from Stand Sit to Stand: Min guard Stand pivot transfers: Min assist       General transfer comment: min guard for safety in power up from [facility], noted to pull on RW  Ambulation/Gait Ambulation/Gait assistance: Min assist Gait Distance (Feet): 2 Feet Assistive device: Rolling walker (2 wheeled) Gait Pattern/deviations: Step-to pattern;Trunk flexed;Leaning posteriorly Gait velocity: Decreased   General Gait Details: Impulsive, unsteady steps from [facility] to bed with RW, min guard requiring minA for safety due to impulsive movement and pt falling back against bed; pt adamantly declines additional mobility attempts despite max encouragement   Stairs             Wheelchair Mobility    Modified Rankin (Stroke Patients Only)       Balance Overall balance assessment: Needs assistance Sitting-balance support: No upper extremity supported;Feet supported Sitting balance-Leahy Scale: Good     Standing balance support: During functional activity;Bilateral upper extremity supported;No upper extremity supported Standing balance-Leahy Scale: Fair Standing balance comment: Can static stand without UE support; reliant on single UE support to perform standing pericare                            Cognition Arousal/Alertness: Awake/alert Behavior During Therapy: Flat affect;Impulsive Overall Cognitive Status: Impaired/Different from baseline                                 General Comments: pt with poor safety awareness; slow processing, flat affect, very minimal verbal responses to questions/conversation. Wife present during session and reports  pt not typically this non-responsive or shut down      Exercises      General Comments General comments (skin integrity, edema, etc.): Pt's wife Darrell [name]) present, attempting to help encourage pt to mobilize to no avail. Wife able to confirm home set-up and DME needs; would likely benefit from  w/c for community distances. Difficult to engage pt in conversation      Pertinent Vitals/Pain Pain Assessment: Faces Faces Pain Scale: No hurt Pain Intervention(s): Monitored during session    Home Living                      Prior Function            PT Goals (current goals can now be found in the care plan section) Acute Rehab PT Goals Patient Stated Goal: go home Progress towards PT goals: Not progressing toward goals - comment (difficult to determine)    Frequency    Min 3X/week      PT Plan Current plan remains appropriate    Co-evaluation PT/OT/SLP Co-Evaluation/Treatment: Yes Reason for Co-Treatment: Necessary to address cognition/behavior during functional activity   OT goals addressed during session: ADL's and self-care      AM-PAC PT "6 Clicks" Mobility   Outcome Measure  Help needed turning from your back to your side while in a flat bed without using bedrails?: A Little Help needed moving from lying on your back to sitting on the side of a flat bed without using bedrails?: A Little Help needed moving to and from a bed to a chair (including a wheelchair)?: A Little Help needed standing up from a chair using your arms (e.g., wheelchair or bedside chair)?: A Little Help needed to walk in hospital room?: A Little Help needed climbing 3-5 steps with a railing? : A Lot 6 Click Score: 17    End of Session Equipment Utilized During Treatment: Gait belt Activity Tolerance: Other (comment) (pt will not specify what is limiting his participation) Patient left: in bed;with call bell/phone within reach;with bed alarm set;with family/visitor present Nurse Communication: Mobility status PT Visit Diagnosis: Other abnormalities of gait and mobility (R26.89);Unsteadiness on feet (R26.81);Muscle weakness (generalized) (M62.81)     Time: 0623-7628 PT Time Calculation (min) (ACUTE ONLY): 26 min  Charges:  $Therapeutic Activity: 8-22 mins                      Darrell [name], PT, DPT Acute Rehabilitation Services  Pager [phone] Office [phone]  Darrell [name] 10/30/2020, 9:01 AM

## 2020-10-31 DIAGNOSIS — I10 Essential (primary) hypertension: Secondary | ICD-10-CM | POA: Diagnosis not present

## 2020-10-31 DIAGNOSIS — N1832 Chronic kidney disease, stage 3b: Secondary | ICD-10-CM

## 2020-10-31 DIAGNOSIS — I214 Non-ST elevation (NSTEMI) myocardial infarction: Secondary | ICD-10-CM | POA: Diagnosis not present

## 2020-10-31 LAB — BASIC METABOLIC PANEL
Anion gap: 9 (ref 5–15)
BUN: 27 mg/dL — ABNORMAL HIGH (ref 8–23)
CO2: 21 mmol/L — ABNORMAL LOW (ref 22–32)
Calcium: 8.2 mg/dL — ABNORMAL LOW (ref 8.9–10.3)
Chloride: 104 mmol/L (ref 98–111)
Creatinine, Ser: 2.03 mg/dL — ABNORMAL HIGH (ref 0.61–1.24)
GFR, Estimated: 36 mL/min — ABNORMAL LOW (ref >60)
Glucose, Bld: 101 mg/dL — ABNORMAL HIGH (ref 70–99)
Potassium: 4.7 mmol/L (ref 3.5–5.1)
Sodium: 134 mmol/L — ABNORMAL LOW (ref 135–145)

## 2020-10-31 LAB — GLUCOSE, CAPILLARY
Glucose-Capillary: 105 mg/dL — ABNORMAL HIGH (ref 70–99)
Glucose-Capillary: 124 mg/dL — ABNORMAL HIGH (ref 70–99)
Glucose-Capillary: 101 mg/dL — ABNORMAL HIGH (ref 70–99)
Glucose-Capillary: 156 mg/dL — ABNORMAL HIGH (ref 70–99)

## 2020-10-31 MED ORDER — AMLODIPINE BESYLATE 5 MG PO TABS
5.0000 mg | ORAL_TABLET | Freq: Every day | ORAL | Status: AC
  Administered 2020-10-31: 5 mg via ORAL
  Filled 2020-10-31: qty 1

## 2020-10-31 MED ORDER — SODIUM CHLORIDE 0.9 % IV SOLN: 250.0000 mL | INTRAVENOUS | Status: DC | PRN

## 2020-10-31 MED ORDER — SODIUM CHLORIDE 0.9% FLUSH
3.0000 mL | Freq: Two times a day (BID) | INTRAVENOUS | Status: DC
  Administered 2020-10-31 – 2020-11-02 (×4): 3 mL via INTRAVENOUS

## 2020-10-31 MED ORDER — SODIUM CHLORIDE 0.9% FLUSH: 3.0000 mL | INTRAVENOUS | Status: DC | PRN

## 2020-10-31 MED ORDER — SODIUM CHLORIDE 0.9 % IV SOLN: INTRAVENOUS | Status: DC

## 2020-10-31 MED ORDER — AMIODARONE HCL 200 MG PO TABS
200.0000 mg | ORAL_TABLET | Freq: Two times a day (BID) | ORAL | Status: DC
  Administered 2020-10-31 – 2020-11-02 (×5): 200 mg via ORAL
  Filled 2020-10-31 (×5): qty 1

## 2020-10-31 MED ORDER — ASPIRIN 81 MG PO CHEW
81.0000 mg | CHEWABLE_TABLET | ORAL | Status: AC
  Administered 2020-11-01: 81 mg via ORAL
  Filled 2020-10-31: qty 1

## 2020-10-31 MED ORDER — AMLODIPINE BESYLATE 10 MG PO TABS
10.0000 mg | ORAL_TABLET | Freq: Every day | ORAL | Status: DC
  Administered 2020-11-01 – 2020-11-02 (×2): 10 mg via ORAL
  Filled 2020-10-31 (×2): qty 1

## 2020-10-31 NOTE — Progress Notes (Signed)
Pt. To undergo cath tomorrow. Spoke with RN, advised to put in order for 2 PIVs in left arm first thing in the  morning to preserve veins. RN verbalized understanding.

## 2020-10-31 NOTE — Progress Notes (Signed)
Encourage patient to watch the CCTV video but he refused, wife at the bed side watches the video.

## 2020-10-31 NOTE — Progress Notes (Addendum)
Progress Note  Patient Name: Darrell [name] Date of Encounter: 10/31/2020  [facility] Cardiologist: Minus Breeding, MD   Subjective   Had some SOB yesterday which improved with IV lasix. No complaints of chest pain or palpitations. Wife at bedside feels like he is improving overall. We discussed improvement/stabilization of his kidney function and plans for LHC this admission - tentatively planning for tomorrow.   Inpatient Medications    Scheduled Meds:  amiodarone  200 mg Oral BID   amLODipine  5 mg Oral Daily   aspirin EC  81 mg Oral Daily   atorvastatin  80 mg Oral QPM   carvedilol  6.25 mg Oral BID WC   chlorhexidine gluconate (MEDLINE KIT)  15 mL Mouth Rinse BID   Chlorhexidine Gluconate Cloth  6 each Topical Daily   heparin injection (subcutaneous)  5,000 Units Subcutaneous Q8H   insulin aspart  0-15 Units Subcutaneous TID WC   pantoprazole  40 mg Oral Daily   sodium bicarbonate  650 mg Oral BID   sodium chloride flush  10-40 mL Intracatheter Q12H   Continuous Infusions:  PRN Meds: albuterol, alum & mag hydroxide-simeth, nitroGLYCERIN, ondansetron (ZOFRAN) IV, sodium chloride flush   Vital Signs    Vitals:   10/31/20 0000 10/31/20 0342 10/31/20 0614 10/31/20 0723  BP: (!) 165/80 (!) 174/74  (!) 169/62  Pulse: 62 63  65  Resp: _0 Temp: 97.6 F (36.4 C) (!) 97.4 F (36.3 C)  98.1 F (36.7 C)  TempSrc: Oral Oral  Oral  SpO2: 97% 96%  97%  Weight:   104.1 kg   Height:        Intake/Output Summary (Last 24 hours) at 10/31/2020 1017 Last data filed at 10/31/2020 0636 Gross per 24 hour  Intake 360 ml  Output 2650 ml  Net -2290 ml   Last 3 Weights 10/31/2020 10/28/2020 10/27/2020  Weight (lbs) 229 lb 8 oz 230 lb 2.6 oz 230 lb 2.6 oz  Weight (kg) 104.101 kg 104.4 kg 104.4 kg      Telemetry    Sinus rhythm/sinus bradycardia with occasional PVCs - Personally Reviewed  ECG    No new tracings - Personally Reviewed  Physical Exam   GEN:  Chronically ill appearing gentleman sitting in bed in acute distress.   Neck: No JVD Cardiac: RRR, no murmurs, rubs, or gallops.  Respiratory: Clear to auscultation bilaterally. GI: Soft, nontender, non-distended  MS: No edema; No deformity. Neuro:  Nonfocal  Psych: Normal affect   Labs    High Sensitivity Troponin:   Recent Labs  Lab 10/15/20 0502 10/15/20 0750 10/16/20 0953 10/16/20 1143  TROPONINIHS 685* 902* 6,892* 6,596*     Chemistry Recent Labs  Lab 10/26/20 0726 10/27/20 0117 10/28/20 0430 10/30/20 0420 10/31/20 0325  NA 135 132* 132* 134* 134*  K 3.9 4.4 4.5 4.8 4.7  CL 107 106 105 106 104  CO2 18* 17* 20* 21* 21*  GLUCOSE 136* 137* 138* 116* 101*  BUN 37* 34* 28* 26* 27*  CREATININE 2.77* 2.53* 2.21* 2.02* 2.03*  CALCIUM 8.3* 8.1* 8.0* 8.2* 8.2*  MG 1.7 1.9 2.0  --   --   GFRNONAA 25* 28* 32* 36* 36*  ANIONGAP _1 Lipids No results for input(s): CHOL, TRIG, HDL, LABVLDL, LDLCALC, CHOLHDL in the last 168 hours.  Hematology Recent Labs  Lab 10/27/20 0117 10/28/20 0430 10/30/20 0420  WBC 7.0 7.5 7.9  RBC 3.54*  3.65* 3.54*  HGB 8.2* 8.5* 8.1*  HCT 28.3* 29.2* 28.6*  MCV 79.9* 80.0 80.8  MCH 23.2* 23.3* 22.9*  MCHC 29.0* 29.1* 28.3*  RDW 20.1* 20.2* 20.4*  PLT 218 214 269   Thyroid No results for input(s): TSH, FREET4 in the last 168 hours.  BNPNo results for input(s): BNP, PROBNP in the last 168 hours.  DDimer No results for input(s): DDIMER in the last 168 hours.   Radiology    DG CHEST PORT 1 VIEW  Result Date: 10/30/2020 CLINICAL DATA:  Chest pain EXAM: PORTABLE CHEST 1 VIEW COMPARISON:  10/23/2020 FINDINGS: Left subclavian approach central venous catheter is stable in positioning. Stable cardiomegaly status post CABG. Suspect small bilateral pleural effusions. Mild streaky left basilar opacity. No pneumothorax. IMPRESSION: 1. Suspect small bilateral pleural effusions. 2. Mild streaky left basilar opacity, favor atelectasis.  Electronically Signed   By: Davina Poke D.O.   On: 10/30/2020 14:34    Cardiac Studies   Limited echo 10/16/20: 1. Hypokinesis of the inferior wall (base/mid) and apex. NO significant  change from echo from AUg 2022.Marland Kitchen Left ventricular ejection fraction, by  estimation, is 50 to 55%. The left ventricle has low normal function.  There is moderate left ventricular  hypertrophy.   2. Right ventricular systolic function is normal. The right ventricular  size is normal. There is normal pulmonary artery systolic pressure.   3. The mitral valve is normal in structure. Mild mitral valve  regurgitation.   4. The aortic valve is normal in structure. Aortic valve regurgitation is  trivial. Mild aortic valve sclerosis is present, with no evidence of  aortic valve stenosis.   5. The inferior vena cava is dilated in size with <50% respiratory  variability, suggesting right atrial pressure of 15 mmHg.   CARDIAC CATH: 05/04/2018 Mid RCA lesion is 100% stenosed. Ost RCA to Prox RCA lesion is 99% stenosed. Ost LAD to Prox LAD lesion is 100% stenosed. Mid Cx to Dist Cx lesion is 100% stenosed. Origin to Prox Graft lesion is 100% stenosed. Mid LAD to Dist LAD lesion is 90% stenosed with 100% stenosed side branch in Ost 3rd Sept. Origin to Prox Graft lesion is 100% stenosed. Ost Cx to Prox Cx lesion is 99% stenosed. Scoring balloon angioplasty was performed using a BALLOON WOLVERINE 2.75X10. Post intervention, there is a 30% residual stenosis. The left ventricular ejection fraction is 50-55% by visual estimate. LV end diastolic pressure is normal. IMPRESSION: Mr. Clemons had anatomy fairly similar to what it was in 2017.  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  The mid to distal AV groove circumflex stent was occluded at this point whereas 3 years ago it was subtotally occluded.  His distal LAD beyond LIMA insertion was diffusely  and severely diseased as well.     I performed Cutting Balloon atherectomy reducing a 99% lesion to less than 30%.  Patient tolerated procedure well.  He was already on aspirin Plavix.  Angiomax was turned off.  He left the lab in stable condition on IV nitroglycerin for blood pressure control.  The sheath will be removed and pressure held.  Patient will be gently hydrated and most likely discharged home tomorrow on aspirin and Plavix.  Intervention     Patient Profile     65 y.o. male with PMH CAD s/p CABG and prior PCI, hypertension, hyperlipidemia, type II diabetes, chronic kidney disease who presented with chest pain, then had  VT arrest 10/16/20.  Assessment & Plan    1. NSTEMI/VT arrest in patient with history of CAD s/p CABG in 2009 with subsequent PCIs: patient presented with VT arrest, suspicious for ischemic etiology. He was started on IV amiodarone without clear recurrence though does continue to have intermittent PVCs. Amiodarone rate reduced 10/24/20 due to bradycardia concerns. AKI limited ischemic evaluation earlier in admission though continues to improve and remains stable at 2.0 today. - Will plan for LHC tomorrow to evaluate etiology of VT arrest.  - Shared Decision Making/Informed Consent{ The risks [stroke (1 in 1000), death (1 in 1000), kidney failure [usually temporary] (1 in 500), bleeding (1 in 200), allergic reaction [possibly serious] (1 in 200)], benefits (diagnostic support and management of coronary artery disease) and alternatives of a cardiac catheterization were discussed in detail with Mr. Knoedler and he is willing to proceed. - Will transition to po amiodarone today - 261m BID - Continue aspirin and statin - Continue Bblocker   2. AoCKD stage 3b: Cr peaked at 3.89, down to 2.03 today, stable from 2.02 yesterday. Felt to be 2/2 cardiac arrest/?ATN. Baseline Cr 1.5-1.9 - Continue to avoid nephrotoxic agents - Continue close monitoring    3. HTN: BP generally  above goal. Has had some intermittent bradycardia on telemetry so will avoid further titration of carvedilol.  - Will increase amlodipine to 150mdaily - Continue carvedilol   4. HLD: LDL 120 09/03/20 - Continue atorvastatin       For questions or updates, please contact [facility] consult [email or web address] for contact info under        Signed, KrAbigail ButtsPA-C  10/31/2020, 10:17 AM     Patient seen and examined.  Agree with above documentation.  On exam, patient is alert and oriented, regular rate and rhythm, no murmurs, lungs CTAB, no LE edema or JVD.  He continues on amiodarone, has had no recurrence of VT.  We will transition amiodarone 200 mg twice daily.  Renal function appears to have stabilized at 2.0 (baseline 1.5-1.9).  Will plan cardiac catheterization tomorrow.  ChDonato HeinzMD

## 2020-10-31 NOTE — Progress Notes (Signed)
Darrell [name] is a 65 y.o. with a pertinent PMH of CAD status post CABG (2009) and PCI's, chronic systolic heart failure, CKD 3A, hypertension, type 2 diabetes, HLD admitted September 18 with NSTEMI with subsequent V. tach/PEA arrest with successful ROSC, with plans for heart cath pending improvement of renal function secondary to acute tubular necrosis.  Patient had episode of fever up to 103.1 F on 09/26, has remained afebrile since.  Completed treatment for UTI.  Subjective:  NAEON. Denied any acute complaints. Stated his SOB resolved.   Objective:  Vital signs in last 24 hours: Vitals:   10/30/20 1724 10/30/20 1953 10/31/20 0000 10/31/20 0342  BP: (!) 157/83 (!) 153/71 (!) 165/80 (!) 174/74  Pulse: (!) 59 (!) 55 62 63  Resp: 20  20 20   Temp: 98.3 F (36.8 C) 97.6 F (36.4 C) 97.6 F (36.4 C) (!) 97.4 F (36.3 C)  TempSrc: Oral Oral Oral Oral  SpO2: 100% 100% 97% 96%  Weight:      Height:       Physical Exam Constitutional:      General: He is not in acute distress.    Appearance: Normal appearance. He is not ill-appearing.  HENT:     Head: Normocephalic and atraumatic.     Right Ear: External ear normal.     Left Ear: External ear normal.     Nose: Nose normal.     Mouth/Throat:     Mouth: Mucous membranes are moist.     Pharynx: Oropharynx is clear. No oropharyngeal exudate or posterior oropharyngeal erythema.  Eyes:     Extraocular Movements: Extraocular movements intact.     Conjunctiva/sclera: Conjunctivae normal.     Pupils: Pupils are equal, round, and reactive to light.  Cardiovascular:     Rate and Rhythm: Normal rate and regular rhythm.     Pulses: Normal pulses.     Heart sounds: Normal heart sounds. No murmur heard.   No friction rub.  Pulmonary:     Effort: Pulmonary effort is normal.     Breath sounds: Normal breath sounds.  Abdominal:     General: Bowel sounds are normal.     Palpations: Abdomen is soft.  Musculoskeletal:        General: No  swelling, tenderness, deformity or signs of injury. Normal range of motion.     Cervical back: Normal range of motion and neck supple.     Right lower leg: No edema.     Left lower leg: No edema.  Skin:    Capillary Refill: Capillary refill takes less than 2 seconds.     Coloration: Skin is not jaundiced or pale.     Findings: No erythema, lesion or rash.  Neurological:     General: No focal deficit present.     Mental Status: He is alert and oriented to person, place, and time.  Psychiatric:        -At baseline   Assessment/Plan:  Darrell [name] is a 65 y.o. with a pertinent PMH of CAD status post CABG (2009) and PCI's, chronic systolic heart failure, CKD 3A, hypertension, type 2 diabetes, HLD admitted September 18 with NSTEMI with subsequent V. tach/PEA arrest with successful ROSC, with plans for heart cath pending improvement of renal function secondary to acute tubular necrosis.  Patient had episode of fever up to 103.1 F on 09/26 2/2 to UTI, has remained afebrile since.  Completed abx course for UTI.    NSTEMI V.Tach arrest HTN -  Oral amiodarone 200 mg BID; central cath removed.  - Continue aspirin and lipitor 80 mg - Plan for cath 10/05 pending continues to be stable or improves.  -Patient hypertensive today at 160s/70s  --Continue amlodipine 10 mg qd and carvedilol 6.25 mg BID   AKI on CKD III Hyponatremia NAGMA -Secondary to ATN with slow improvement. Strict I/O. Daily weights. Net negative 3370 ml since yesterday. Kidney fxn stable since yesterday at 2.0   -Sodium stable at 134, CO2 stable to 21. -Continue sodium Bicarb 650 mg BID   IDA (stable) -s/p 2 units on 09/19 and 1 unit on 09/27. Hgb stable with last reading 10/30/20 showing 8.1. Continue to monitor  E. Coli UTI (resolved)  DVT prophx: Sub Q hep 5000 units Diet: Cardiac Bowel: PRN Code:Full Abx: oral keflex  Prior to Admission Living Arrangement: Home Anticipated Discharge Location: Home Barriers  to Discharge: Cardiac workup Dispo: Anticipated discharge in approximately 3-4 day(s).   Darrell Abbot, MD 10/31/2020, 5:42 AM Pager: [phone] After 5pm on weekdays and 1pm on weekends:[phone]

## 2020-10-31 NOTE — Progress Notes (Signed)
Physical Therapy Treatment Patient Details Name: Darrell [name] MRN: [id number] DOB: [birth year] Today's Date: 10/31/2020   History of Present Illness Pt is a 65 y.o. male admitted 10/15/20 with c/o chest pain. Pt with NSTEMI/VT arrest on 9/19; ETT 9/19-9/20. Anticipate LHC when creatinine improves. PMH includes CHF, CABG (2009), NSTEMI (8/22), bedbug infestation, CKD, CAD, DM, HTN.    PT Comments    Pt continues to be dismissive regarding participation in therapy. Only able to assess mobility if therapist's arrival coincides with pt using BSC. He demonstrates mod I bed mobility. Min guard assist for transfers without AD. Pt unwilling to verbally communicate or make eye contact with therapist. Declining ambulation attempts.  Wife present in room and reports pt is minimally active/mobile at baseline. He uses RW for amb, and bumps up the stairs (to their second floor apt) on his bottom.  Their son is always present to assist pt in/out of his apartment.  Pt is scheduled to undergo heart cath tomorrow, 10/5.   Recommendations for follow up therapy are one component of a multi-disciplinary discharge planning process, led by the attending physician.  Recommendations may be updated based on patient status, additional functional criteria and insurance authorization.  Follow Up Recommendations  Home health PT;Supervision/Assistance - 24 hour     Equipment Recommendations  3in1 (PT)    Recommendations for Other Services       Precautions / Restrictions Precautions Precautions: Fall Restrictions RLE Weight Bearing: Weight bearing as tolerated LLE Weight Bearing: Weight bearing as tolerated     Mobility  Bed Mobility Overal bed mobility: Modified Independent Bed Mobility: Supine to Sit;Sit to Supine     Supine to sit: HOB elevated;Modified independent (Device/Increase time) Sit to supine: Modified independent (Device/Increase time);HOB elevated   General bed mobility comments:  +rail    Transfers Overall transfer level: Needs assistance   Transfers: Stand Pivot Transfers;Sit to/from Stand Sit to Stand: Min guard Stand pivot transfers: Min guard       General transfer comment: min guard for safety. Transferring to/from [facility] without AD.  Ambulation/Gait             General Gait Details: Pt declining.   Stairs             Wheelchair Mobility    Modified Rankin (Stroke Patients Only)       Balance Overall balance assessment: Needs assistance Sitting-balance support: No upper extremity supported;Feet supported Sitting balance-Leahy Scale: Good     Standing balance support: Single extremity supported;No upper extremity supported;During functional activity Standing balance-Leahy Scale: Fair Standing balance comment: static stand without UE support, reaching for assist during mobility                            Cognition Arousal/Alertness: Awake/alert Behavior During Therapy: Flat affect;Impulsive Overall Cognitive Status: Impaired/Different from baseline                                 General Comments: poor safety awareness, slow processing, poor medical insight. Unwilling to verbally communicate with therapist. Only shaking his head yes/no when willing to answer. Otherwise, looking down, without eye contact.      Exercises      General Comments General comments (skin integrity, edema, etc.): Pt's wife, [name], present. Pt unwilling to engage in conversation with therapist.      Pertinent Vitals/Pain Pain Assessment: Faces Faces Pain  Scale: No hurt    Home Living                      Prior Function            PT Goals (current goals can now be found in the care plan section) Acute Rehab PT Goals Patient Stated Goal: home Progress towards PT goals: Not progressing toward goals - comment (continued refusals for ambulation)    Frequency    Min 3X/week      PT Plan Current plan  remains appropriate    Co-evaluation              AM-PAC PT "6 Clicks" Mobility   Outcome Measure  Help needed turning from your back to your side while in a flat bed without using bedrails?: None Help needed moving from lying on your back to sitting on the side of a flat bed without using bedrails?: None Help needed moving to and from a bed to a chair (including a wheelchair)?: A Little Help needed standing up from a chair using your arms (e.g., wheelchair or bedside chair)?: A Little Help needed to walk in hospital room?: A Little Help needed climbing 3-5 steps with a railing? : A Lot 6 Click Score: 19    End of Session   Activity Tolerance: Other (comment) (self limiting) Patient left: in bed;with call bell/phone within reach;with family/visitor present Nurse Communication: Mobility status PT Visit Diagnosis: Other abnormalities of gait and mobility (R26.89);Unsteadiness on feet (R26.81);Muscle weakness (generalized) (M62.81)     Time: 4680-3212 PT Time Calculation (min) (ACUTE ONLY): 17 min  Charges:  $Therapeutic Activity: 8-22 mins                     Aida Raider, PT  Office # [phone] Pager [phone]    Ilda Foil 10/31/2020, 10:36 AM

## 2020-10-31 NOTE — Progress Notes (Signed)
CVC line removed per order. Patient tolerated well. Site WDL covered with gauze and tegaderm.  Netta Corrigan, RN

## 2020-10-31 NOTE — H&P (View-Only) (Signed)
Progress Note  Patient Name: Darrell [name] Date of Encounter: 10/31/2020  [facility] Cardiologist: Minus Breeding, MD   Subjective   Had some SOB yesterday which improved with IV lasix. No complaints of chest pain or palpitations. Wife at bedside feels like he is improving overall. We discussed improvement/stabilization of his kidney function and plans for LHC this admission - tentatively planning for tomorrow.   Inpatient Medications    Scheduled Meds:  amiodarone  200 mg Oral BID   amLODipine  5 mg Oral Daily   aspirin EC  81 mg Oral Daily   atorvastatin  80 mg Oral QPM   carvedilol  6.25 mg Oral BID WC   chlorhexidine gluconate (MEDLINE KIT)  15 mL Mouth Rinse BID   Chlorhexidine Gluconate Cloth  6 each Topical Daily   heparin injection (subcutaneous)  5,000 Units Subcutaneous Q8H   insulin aspart  0-15 Units Subcutaneous TID WC   pantoprazole  40 mg Oral Daily   sodium bicarbonate  650 mg Oral BID   sodium chloride flush  10-40 mL Intracatheter Q12H   Continuous Infusions:  PRN Meds: albuterol, alum & mag hydroxide-simeth, nitroGLYCERIN, ondansetron (ZOFRAN) IV, sodium chloride flush   Vital Signs    Vitals:   10/31/20 0000 10/31/20 0342 10/31/20 0614 10/31/20 0723  BP: (!) 165/80 (!) 174/74  (!) 169/62  Pulse: 62 63  65  Resp: _0 Temp: 97.6 F (36.4 C) (!) 97.4 F (36.3 C)  98.1 F (36.7 C)  TempSrc: Oral Oral  Oral  SpO2: 97% 96%  97%  Weight:   104.1 kg   Height:        Intake/Output Summary (Last 24 hours) at 10/31/2020 1017 Last data filed at 10/31/2020 0636 Gross per 24 hour  Intake 360 ml  Output 2650 ml  Net -2290 ml   Last 3 Weights 10/31/2020 10/28/2020 10/27/2020  Weight (lbs) 229 lb 8 oz 230 lb 2.6 oz 230 lb 2.6 oz  Weight (kg) 104.101 kg 104.4 kg 104.4 kg      Telemetry    Sinus rhythm/sinus bradycardia with occasional PVCs - Personally Reviewed  ECG    No new tracings - Personally Reviewed  Physical Exam   GEN:  Chronically ill appearing gentleman sitting in bed in acute distress.   Neck: No JVD Cardiac: RRR, no murmurs, rubs, or gallops.  Respiratory: Clear to auscultation bilaterally. GI: Soft, nontender, non-distended  MS: No edema; No deformity. Neuro:  Nonfocal  Psych: Normal affect   Labs    High Sensitivity Troponin:   Recent Labs  Lab 10/15/20 0502 10/15/20 0750 10/16/20 0953 10/16/20 1143  TROPONINIHS 685* 902* 6,892* 6,596*     Chemistry Recent Labs  Lab 10/26/20 0726 10/27/20 0117 10/28/20 0430 10/30/20 0420 10/31/20 0325  NA 135 132* 132* 134* 134*  K 3.9 4.4 4.5 4.8 4.7  CL 107 106 105 106 104  CO2 18* 17* 20* 21* 21*  GLUCOSE 136* 137* 138* 116* 101*  BUN 37* 34* 28* 26* 27*  CREATININE 2.77* 2.53* 2.21* 2.02* 2.03*  CALCIUM 8.3* 8.1* 8.0* 8.2* 8.2*  MG 1.7 1.9 2.0  --   --   GFRNONAA 25* 28* 32* 36* 36*  ANIONGAP _1 Lipids No results for input(s): CHOL, TRIG, HDL, LABVLDL, LDLCALC, CHOLHDL in the last 168 hours.  Hematology Recent Labs  Lab 10/27/20 0117 10/28/20 0430 10/30/20 0420  WBC 7.0 7.5 7.9  RBC 3.54*  3.65* 3.54*  HGB 8.2* 8.5* 8.1*  HCT 28.3* 29.2* 28.6*  MCV 79.9* 80.0 80.8  MCH 23.2* 23.3* 22.9*  MCHC 29.0* 29.1* 28.3*  RDW 20.1* 20.2* 20.4*  PLT 218 214 269   Thyroid No results for input(s): TSH, FREET4 in the last 168 hours.  BNPNo results for input(s): BNP, PROBNP in the last 168 hours.  DDimer No results for input(s): DDIMER in the last 168 hours.   Radiology    DG CHEST PORT 1 VIEW  Result Date: 10/30/2020 CLINICAL DATA:  Chest pain EXAM: PORTABLE CHEST 1 VIEW COMPARISON:  10/23/2020 FINDINGS: Left subclavian approach central venous catheter is stable in positioning. Stable cardiomegaly status post CABG. Suspect small bilateral pleural effusions. Mild streaky left basilar opacity. No pneumothorax. IMPRESSION: 1. Suspect small bilateral pleural effusions. 2. Mild streaky left basilar opacity, favor atelectasis.  Electronically Signed   By: Davina Poke D.O.   On: 10/30/2020 14:34    Cardiac Studies   Limited echo 10/16/20: 1. Hypokinesis of the inferior wall (base/mid) and apex. NO significant  change from echo from AUg 2022.Marland Kitchen Left ventricular ejection fraction, by  estimation, is 50 to 55%. The left ventricle has low normal function.  There is moderate left ventricular  hypertrophy.   2. Right ventricular systolic function is normal. The right ventricular  size is normal. There is normal pulmonary artery systolic pressure.   3. The mitral valve is normal in structure. Mild mitral valve  regurgitation.   4. The aortic valve is normal in structure. Aortic valve regurgitation is  trivial. Mild aortic valve sclerosis is present, with no evidence of  aortic valve stenosis.   5. The inferior vena cava is dilated in size with <50% respiratory  variability, suggesting right atrial pressure of 15 mmHg.   CARDIAC CATH: 05/04/2018 Mid RCA lesion is 100% stenosed. Ost RCA to Prox RCA lesion is 99% stenosed. Ost LAD to Prox LAD lesion is 100% stenosed. Mid Cx to Dist Cx lesion is 100% stenosed. Origin to Prox Graft lesion is 100% stenosed. Mid LAD to Dist LAD lesion is 90% stenosed with 100% stenosed side branch in Ost 3rd Sept. Origin to Prox Graft lesion is 100% stenosed. Ost Cx to Prox Cx lesion is 99% stenosed. Scoring balloon angioplasty was performed using a BALLOON WOLVERINE 2.75X10. Post intervention, there is a 30% residual stenosis. The left ventricular ejection fraction is 50-55% by visual estimate. LV end diastolic pressure is normal. IMPRESSION: Darrell [name] had anatomy fairly similar to what it was in 2017.  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  The mid to distal AV groove circumflex stent was occluded at this point whereas 3 years ago it was subtotally occluded.  His distal LAD beyond LIMA insertion was diffusely  and severely diseased as well.     I performed Cutting Balloon atherectomy reducing a 99% lesion to less than 30%.  Patient tolerated procedure well.  He was already on aspirin Plavix.  Angiomax was turned off.  He left the lab in stable condition on IV nitroglycerin for blood pressure control.  The sheath will be removed and pressure held.  Patient will be gently hydrated and most likely discharged home tomorrow on aspirin and Plavix.  Intervention     Patient Profile     65 y.o. male with PMH CAD s/p CABG and prior PCI, hypertension, hyperlipidemia, type II diabetes, chronic kidney disease who presented with chest pain, then had  VT arrest 10/16/20.  Assessment & Plan    1. NSTEMI/VT arrest in patient with history of CAD s/p CABG in 2009 with subsequent PCIs: patient presented with VT arrest, suspicious for ischemic etiology. He was started on IV amiodarone without clear recurrence though does continue to have intermittent PVCs. Amiodarone rate reduced 10/24/20 due to bradycardia concerns. AKI limited ischemic evaluation earlier in admission though continues to improve and remains stable at 2.0 today. - Will plan for LHC tomorrow to evaluate etiology of VT arrest.  - Shared Decision Making/Informed Consent{ The risks [stroke (1 in 1000), death (1 in 1000), kidney failure [usually temporary] (1 in 500), bleeding (1 in 200), allergic reaction [possibly serious] (1 in 200)], benefits (diagnostic support and management of coronary artery disease) and alternatives of a cardiac catheterization were discussed in detail with Darrell [name] and he is willing to proceed. - Will transition to po amiodarone today - 287m BID - Continue aspirin and statin - Continue Bblocker   2. AoCKD stage 3b: Cr peaked at 3.89, down to 2.03 today, stable from 2.02 yesterday. Felt to be 2/2 cardiac arrest/?ATN. Baseline Cr 1.5-1.9 - Continue to avoid nephrotoxic agents - Continue close monitoring    3. HTN: BP generally  above goal. Has had some intermittent bradycardia on telemetry so will avoid further titration of carvedilol.  - Will increase amlodipine to 110mdaily - Continue carvedilol   4. HLD: LDL 120 09/03/20 - Continue atorvastatin       For questions or updates, please contact [facility] consult [email or web address] for contact info under        Signed, KrAbigail ButtsPA-C  10/31/2020, 10:17 AM     Patient seen and examined.  Agree with above documentation.  On exam, patient is alert and oriented, regular rate and rhythm, no murmurs, lungs CTAB, no LE edema or JVD.  He continues on amiodarone, has had no recurrence of VT.  We will transition amiodarone 200 mg twice daily.  Renal function appears to have stabilized at 2.0 (baseline 1.5-1.9).  Will plan cardiac catheterization tomorrow.  ChDonato HeinzMD

## 2020-11-01 ENCOUNTER — Encounter (HOSPITAL_COMMUNITY): Payer: Self-pay | Admitting: Cardiovascular Disease

## 2020-11-01 ENCOUNTER — Inpatient Hospital Stay (HOSPITAL_COMMUNITY): Admission: RE | Admit: 2020-11-01 | Payer: Medicare Other | Source: Home / Self Care | Admitting: Cardiovascular Disease

## 2020-11-01 ENCOUNTER — Encounter (HOSPITAL_COMMUNITY)
Admission: EM | Disposition: A | Discharge status: Home / Self Care | Payer: Self-pay | Source: Home / Self Care | Attending: Internal Medicine

## 2020-11-01 DIAGNOSIS — I25810 Atherosclerosis of coronary artery bypass graft(s) without angina pectoris: Secondary | ICD-10-CM | POA: Diagnosis not present

## 2020-11-01 DIAGNOSIS — I472 Ventricular tachycardia, unspecified: Secondary | ICD-10-CM | POA: Diagnosis not present

## 2020-11-01 DIAGNOSIS — I214 Non-ST elevation (NSTEMI) myocardial infarction: Secondary | ICD-10-CM | POA: Diagnosis not present

## 2020-11-01 DIAGNOSIS — I2510 Atherosclerotic heart disease of native coronary artery without angina pectoris: Secondary | ICD-10-CM | POA: Diagnosis not present

## 2020-11-01 HISTORY — PX: LEFT HEART CATH AND CORS/GRAFTS ANGIOGRAPHY: CATH118250

## 2020-11-01 LAB — CBC
HCT: 29.2 % — ABNORMAL LOW (ref 39.0–52.0)
Hemoglobin: 8.7 g/dL — ABNORMAL LOW (ref 13.0–17.0)
MCH: 23.1 pg — ABNORMAL LOW (ref 26.0–34.0)
MCHC: 29.8 g/dL — ABNORMAL LOW (ref 30.0–36.0)
MCV: 77.7 fL — ABNORMAL LOW (ref 80.0–100.0)
Platelets: 301 10*3/uL (ref 150–400)
RBC: 3.76 MIL/uL — ABNORMAL LOW (ref 4.22–5.81)
RDW: 20.1 % — ABNORMAL HIGH (ref 11.5–15.5)
WBC: 5.3 10*3/uL (ref 4.0–10.5)
nRBC: 0 % (ref 0.0–0.2)

## 2020-11-01 LAB — BASIC METABOLIC PANEL
Anion gap: 6 (ref 5–15)
BUN: 22 mg/dL (ref 8–23)
CO2: 22 mmol/L (ref 22–32)
Calcium: 8.1 mg/dL — ABNORMAL LOW (ref 8.9–10.3)
Chloride: 106 mmol/L (ref 98–111)
Creatinine, Ser: 1.91 mg/dL — ABNORMAL HIGH (ref 0.61–1.24)
GFR, Estimated: 39 mL/min — ABNORMAL LOW (ref >60)
Glucose, Bld: 89 mg/dL (ref 70–99)
Potassium: 4.3 mmol/L (ref 3.5–5.1)
Sodium: 134 mmol/L — ABNORMAL LOW (ref 135–145)

## 2020-11-01 LAB — GLUCOSE, CAPILLARY
Glucose-Capillary: 116 mg/dL — ABNORMAL HIGH (ref 70–99)
Glucose-Capillary: 95 mg/dL (ref 70–99)
Glucose-Capillary: 84 mg/dL (ref 70–99)
Glucose-Capillary: 143 mg/dL — ABNORMAL HIGH (ref 70–99)

## 2020-11-01 SURGERY — LEFT HEART CATH AND CORS/GRAFTS ANGIOGRAPHY
Anesthesia: LOCAL

## 2020-11-01 MED ORDER — CHLORHEXIDINE GLUCONATE 0.12 % MT SOLN
OROMUCOSAL | Status: AC
  Filled 2020-11-01: qty 15

## 2020-11-01 MED ORDER — SODIUM CHLORIDE 0.9 % IV SOLN: INTRAVENOUS | Status: AC

## 2020-11-01 MED ORDER — SODIUM CHLORIDE 0.9% FLUSH
3.0000 mL | Freq: Two times a day (BID) | INTRAVENOUS | Status: DC
  Administered 2020-11-02: 3 mL via INTRAVENOUS

## 2020-11-01 MED ORDER — LIDOCAINE HCL (PF) 1 % IJ SOLN
INTRAMUSCULAR | Status: DC | PRN
  Administered 2020-11-01: 15 mL

## 2020-11-01 MED ORDER — IOHEXOL 350 MG/ML SOLN
INTRAVENOUS | Status: DC | PRN
  Administered 2020-11-01: 60 mL

## 2020-11-01 MED ORDER — ACETAMINOPHEN 325 MG PO TABS: 650.0000 mg | ORAL_TABLET | ORAL | Status: DC | PRN

## 2020-11-01 MED ORDER — SODIUM CHLORIDE 0.9% FLUSH: 3.0000 mL | INTRAVENOUS | Status: DC | PRN

## 2020-11-01 MED ORDER — SODIUM CHLORIDE 0.9 % IV SOLN: 250.0000 mL | INTRAVENOUS | Status: DC | PRN

## 2020-11-01 MED ORDER — HEPARIN (PORCINE) IN NACL 1000-0.9 UT/500ML-% IV SOLN
INTRAVENOUS | Status: DC | PRN
  Administered 2020-11-01 (×2): 500 mL

## 2020-11-01 MED ORDER — FENTANYL CITRATE (PF) 100 MCG/2ML IJ SOLN
INTRAMUSCULAR | Status: DC | PRN
  Administered 2020-11-01 (×2): 25 ug via INTRAVENOUS

## 2020-11-01 MED ORDER — HYDRALAZINE HCL 20 MG/ML IJ SOLN
INTRAMUSCULAR | Status: DC | PRN
  Administered 2020-11-01: 10 mg via INTRAVENOUS

## 2020-11-01 MED ORDER — LABETALOL HCL 5 MG/ML IV SOLN
10.0000 mg | INTRAVENOUS | Status: AC | PRN
  Administered 2020-11-01 (×2): 10 mg via INTRAVENOUS
  Filled 2020-11-01 (×2): qty 4

## 2020-11-01 MED ORDER — MIDAZOLAM HCL 2 MG/2ML IJ SOLN
INTRAMUSCULAR | Status: DC | PRN
  Administered 2020-11-01 (×2): 1 mg via INTRAVENOUS

## 2020-11-01 MED ORDER — HYDRALAZINE HCL 20 MG/ML IJ SOLN: 10.0000 mg | INTRAMUSCULAR | Status: AC | PRN

## 2020-11-01 SURGICAL SUPPLY — 12 items
CATH INFINITI 5FR JL5 (CATHETERS) ×1 IMPLANT
CATH INFINITI 5FR MULTPACK ANG (CATHETERS) ×1 IMPLANT
CLOSURE MYNX CONTROL 5F (Vascular Products) ×1 IMPLANT
KIT HEART LEFT (KITS) ×2 IMPLANT
PACK CARDIAC CATHETERIZATION (CUSTOM PROCEDURE TRAY) ×2 IMPLANT
SHEATH PINNACLE 5F 10CM (SHEATH) ×1 IMPLANT
SHEATH PROBE COVER 6X72 (BAG) ×1 IMPLANT
SYR MEDRAD MARK 7 150ML (SYRINGE) ×2 IMPLANT
TRANSDUCER W/STOPCOCK (MISCELLANEOUS) ×2 IMPLANT
TUBING CIL FLEX 10 FLL-RA (TUBING) ×2 IMPLANT
WIRE J 3MM .035X145CM (WIRE) ×1 IMPLANT
WIRE MICRO SET 5FR 12 (WIRE) ×1 IMPLANT

## 2020-11-01 NOTE — Progress Notes (Addendum)
OT Cancellation Note  Patient Details Name: Darrell [name] MRN: [id number] DOB: 09-Dec-1955   Cancelled Treatment:    Reason Eval/Treat Not Completed: Patient declined, no reason specified On entry, pt resting in bed but easily awakened to voice. Pt opened eyes to look at therapist but then kept eyes closed for the remainder of time therapist in room. Would not respond to therapist any further other than to decline any OOB activities. Will follow-up tomorrow for OT session. Per departmental policy, 3 consecutive refusals will warrant discharging therapy services at acute level.   Lorre Munroe 11/01/2020, 12:10 PM

## 2020-11-01 NOTE — Consult Note (Addendum)
ELECTROPHYSIOLOGY CONSULT NOTE    Patient ID: EATHAN GROMAN MRN: [id number], DOB/AGE: 09/07/1955 65 y.o.  Admit date: 10/15/2020 Date of Consult: 11/01/2020  Primary Physician: [name], MD Primary Cardiologist: Minus Breeding, MD  Electrophysiologist: New  Referring Provider: Dr. Gardiner Rhyme  Patient Profile: ADEMIDE SCHABERG is a 65 y.o. male with a history of CAD s/p CABG and multiple PCIs, HTN, HLD, DM2, CKD III, and history of recurrent bed bug infestations who is being seen today for the evaluation of ICD for secondary prevention at the request of Dr. Gardiner Rhyme.  HPI:  TRIP CAVANAGH is a 65 y.o. male with medical history as above. He has a long history of medical non-compliance. He has historically declined caths for possible definitive treatment of recurrent chest pain.   He presented to the ED on 9/18 with recurrent chest pain for about 3 hours prior to calling EMS. He was given [employer] with some symptoms relief. Noted to also be infected with bed-bugs.  Labs in the ED showed sodium 135, potassium 3.8, creatinine 2.2, magnesium 2, high-sensitivity troponin 685>> 902, WBC 8.6, hemoglobin 8.4, lactic acid 1.4.  COVID-negative.  VBG: Bicarb 17 , pCO2 29, pH 7.3.  EKG on admission showed sinus tachycardia, 114 bpm, nonspecific IVCD, old anterior septal infarct with slight ST depression in lateral leads.  Chest x-ray negative.  He was started on IV heparin, cardiology consulted in regards to chest pain/non-STEMI.      He declined Cath.   9/19 he was noted to have VT on telemetry and was pulseless. Shocked to slow PEA. ACLS was performed with total of 10-15 minutes CPR. Rhythm remained PEA through several rounds of epinephrine and HCO3.  Patient eventually recovered a perfusing rhythm, sinus tachy in 110s with stable BP not requiring additional pressors.  He was intubated and moved to ICU. Progressive anemia noted as well.   Pt had AKI/ATN with peak creatinine of  3.89.  Meds were optimized and intermittently diuresed while his Cr gradually trended back down. Taken for cath 11/01/2020 with Cr of 1.91.    Cath showed severe native 3 vessel CAD, occluded SVG-diag/SVG-RCA, patent SVG-OM and LIMA-LAD, no targets for PCI  EP asked to see for ICD for secondary prevention with no amenable targets for PCI.   Patient feeling well currently. Wife present. States biggest problem in the past with compliance has been transportation. Their son is working on taking care of the bed bug infestation. Chest pain currently well controlled.   Past Medical History:  Diagnosis Date   CKD (chronic kidney disease), stage III ([place])    Claudication ([place])    Coronary artery disease    a. 08/2001 s/p DES to RCA/RI;  b. 07/2006 s/p DES to p/d LCX;  c. 11/2007 CABGx5 (LIMA->LAD, VG->D1, LRA->OM1, VG->PDA->LPL); c. 03/2015: Synergy DES to mid-Cx in the setting of NSTEMI; d. 11/2015 NSTEMI->Med Rx. e. NSTEMI 01/2018 -> med rx.   Diabetes mellitus type 2 in nonobese [facility])    a. A1c 7.0 09/2012.   Diastolic dysfunction    a. 02/2016 Echo: EF 50-55%, gr1DD, Ao sclerosis, dil Ao root (25m), sev dil LA, triv TR, PASP 280mg.   Hyperlipidemia    Hypertension    Noncompliance    Right carotid bruit    a. noted 01/2018, Shi Blankenship need OP eval when patient complies with follow-up.     Surgical History:  Past Surgical History:  Procedure Laterality Date   CARDIAC CATHETERIZATION N/A 04/06/2015   Procedure: Left  Heart Cath and Cors/Grafts Angiography;  Surgeon: Lorretta Harp, MD;  Location: [facility];  Service: Cardiovascular;  Laterality: N/A;   CARDIAC CATHETERIZATION N/A 04/06/2015   Procedure: Coronary Stent Intervention;  Surgeon: Lorretta Harp, MD;  2.5 mm x 12 mm long Synergy DES followed by  2.5 mm x 16 mm long Synergy DES     CORONARY ARTERY BYPASS GRAFT  2009   2009 LIMA to LAD, SVG to Diag, SVG to PDA and PL, left radial to OM   CORONARY BALLOON ANGIOPLASTY N/A  05/04/2018   Procedure: CORONARY BALLOON ANGIOPLASTY;  Surgeon: Lorretta Harp, MD;  Location: [facility];  Service: Cardiovascular;  Laterality: N/A;  Prox CFX   LEFT HEART CATH N/A 10/24/2012   Procedure: LEFT HEART CATH;  Surgeon: Troy Sine, MD;  Location: [facility];  Service: Cardiovascular;  Laterality: N/A;   LEFT HEART CATH AND CORS/GRAFTS ANGIOGRAPHY N/A 05/04/2018   Procedure: LEFT HEART CATH AND CORS/GRAFTS ANGIOGRAPHY;  Surgeon: Lorretta Harp, MD;  Location: [facility];  Service: Cardiovascular;  Laterality: N/A;   LEFT HEART CATH AND CORS/GRAFTS ANGIOGRAPHY N/A 11/01/2020   Procedure: LEFT HEART CATH AND CORS/GRAFTS ANGIOGRAPHY;  Surgeon: Sherren Mocha, MD;  Location: [facility];  Service: Cardiovascular;  Laterality: N/A;     Medications Prior to Admission  Medication Sig Dispense Refill Last Dose   ferrous sulfate 325 (65 FE) MG tablet Take 1 tablet (325 mg total) by mouth 2 (two) times daily with a meal. 30 tablet 1 10/14/2020   hydrALAZINE (APRESOLINE) 50 MG tablet Take 1 tablet (50 mg total) by mouth 3 (three) times daily. 90 tablet 1 10/15/2020   amLODipine (NORVASC) 10 MG tablet Take 1 tablet (10 mg total) by mouth daily. Please make PCP appointment. 30 tablet 1    aspirin 81 MG EC tablet Take 1 tablet (81 mg total) by mouth daily. Swallow whole. 30 tablet 1    atorvastatin (LIPITOR) 80 MG tablet Take 1 tablet (80 mg total) by mouth every evening. 30 tablet 1    carvedilol (COREG) 25 MG tablet Take 1 tablet (25 mg total) by mouth 2 (two) times daily with a meal. Please make PCP appointment. 60 tablet 1    clopidogrel (PLAVIX) 75 MG tablet Take 1 tablet (75 mg total) by mouth daily. Please make PCP appointment. 30 tablet 1    isosorbide mononitrate (IMDUR) 60 MG 24 hr tablet Take 2 tablets (120 mg total) by mouth daily. Please make PCP appointment. 60 tablet 1    metFORMIN (GLUCOPHAGE) 500 MG tablet Take 1 tablet (500 mg total) by mouth daily  with breakfast. 30 tablet 1    nitroGLYCERIN (NITROSTAT) 0.4 MG SL tablet Place 1 tablet (0.4 mg total) under the tongue every 5 (five) minutes as needed for chest pain. 25 tablet 1    tamsulosin (FLOMAX) 0.4 MG CAPS capsule Take 1 capsule (0.4 mg total) by mouth daily. Please make PCP appointment. 30 capsule 1     Inpatient Medications:   amiodarone  200 mg Oral BID   amLODipine  10 mg Oral Daily   atorvastatin  80 mg Oral QPM   carvedilol  6.25 mg Oral BID WC   chlorhexidine gluconate (MEDLINE KIT)  15 mL Mouth Rinse BID   Chlorhexidine Gluconate Cloth  6 each Topical Daily   heparin injection (subcutaneous)  5,000 Units Subcutaneous Q8H   insulin aspart  0-15 Units Subcutaneous TID WC  pantoprazole  40 mg Oral Daily   sodium bicarbonate  650 mg Oral BID   sodium chloride flush  10-40 mL Intracatheter Q12H   sodium chloride flush  3 mL Intravenous Q12H   sodium chloride flush  3 mL Intravenous Q12H    Allergies: No Known Allergies  Social History   Socioeconomic History   Marital status: Married    Spouse name: Not on file   Number of children: 3   Years of education: Not on file   Highest education level: Not on file  Occupational History   Not on file  Tobacco Use   Smoking status: Never   Smokeless tobacco: Never  Vaping Use   Vaping Use: Never used  Substance and Sexual Activity   Alcohol use: No    Comment: sts he drank for the first time in a long time last night    Drug use: No   Sexual activity: Not on file  Other Topics Concern   Not on file  Social History Narrative   Lives at home with wife.     Social Determinants of Health   Financial Resource Strain: Not on file  Food Insecurity: Not on file  Transportation Needs: Not on file  Physical Activity: Not on file  Stress: Not on file  Social Connections: Not on file  Intimate Partner Violence: Not on file     Family History  Problem Relation Age of Onset   Heart failure Mother    Cancer Mother     CAD Mother 60   Heart failure Father    Kidney failure Brother      Review of Systems: All other systems reviewed and are otherwise negative except as noted above.  Physical Exam: Vitals:   11/01/20 1329 11/01/20 1349 11/01/20 1400 11/01/20 1435  BP: (!) 163/89 (!) 159/75 (!) 157/72 (!) 152/71  Pulse: 72 71 67   Resp: 16 18    Temp:  98.1 F (36.7 C)    TempSrc:  Oral    SpO2: 100% 98% 98%   Weight:      Height:        GEN- The patient is well appearing, alert and oriented x 3 today.   HEENT: normocephalic, atraumatic; sclera clear, conjunctiva pink; hearing intact; oropharynx clear; neck supple Lungs- Clear to ausculation bilaterally, normal work of breathing.  No wheezes, rales, rhonchi Heart- Regular rate and rhythm, no murmurs, rubs or gallops GI- soft, non-tender, non-distended, bowel sounds present Extremities- no clubbing, cyanosis, or edema; DP/PT/radial pulses 2+ bilaterally MS- no significant deformity or atrophy Skin- warm and dry, no rash or lesion Psych- euthymic mood, full affect Neuro- strength and sensation are intact  Labs:   Lab Results  Component Value Date   WBC 5.3 11/01/2020   HGB 8.7 (L) 11/01/2020   HCT 29.2 (L) 11/01/2020   MCV 77.7 (L) 11/01/2020   PLT 301 11/01/2020    Recent Labs  Lab 11/01/20 0222  NA 134*  K 4.3  CL 106  CO2 22  BUN 22  CREATININE 1.91*  CALCIUM 8.1*  GLUCOSE 89      Radiology/Studies: CARDIAC CATHETERIZATION  Result Date: 11/01/2020   Mid LAD to Dist LAD lesion is 90% stenosed with 100% stenosed side branch in Ost 3rd Sept.   Ost LAD to Prox LAD lesion is 100% stenosed.   Mid Cx to Dist Cx lesion is 100% stenosed.   Mid RCA lesion is 100% stenosed.   Ost RCA to Prox  RCA lesion is 99% stenosed.   Origin to Prox Graft lesion is 100% stenosed.   Origin to Prox Graft lesion is 100% stenosed.   Ost Cx to Prox Cx lesion is 100% stenosed. Severe native 3 vessel CAD Total occlusion of the LAD with severe diffuse  disease beyond the LIMA insertion Total occlusion of the LCx Total occlusion of the RCA, distal branches collateralized by septal perforator branches of the LAD visualized on the LIMA injections 2. S/P CABG with known occlusion of the SVG-diag and SVG-RCA, continued patency of the SVG-OM and LIMA-LAD No targets for PCI. Recommend medical therapy. Consider EP eval for ICD, d/w Primary cardiology service.   US RENAL  Result Date: 10/18/2020 CLINICAL DATA:  Acute kidney injury EXAM: RENAL / URINARY TRACT ULTRASOUND COMPLETE COMPARISON:  Renal ultrasound 08/06/2019 FINDINGS: Right Kidney: Renal measurements: 13.4 x 7.1 x 5.2 cm = volume: 260 mL. Cystic changes at the right renal hilum, difficult to delineate parapelvic cysts from hydronephrosis, although hydronephrosis is favored and was demonstrated on prior exam. There is thinning of the renal parenchyma. Shadowing stone in the upper kidney measures 2 cm. Left Kidney: Renal measurements: 11.1 x 5.7 x 6.0 cm = volume: 96 mL. Areas of cortical thinning and atrophy, better delineated on prior exam. No hydronephrosis. 2.7 cm cyst in the upper kidney. No visualized stone or solid lesion. Bladder: Decompressed by Foley catheter. Other: None. IMPRESSION: 1. Cystic changes of the right renal hila favored to represent hydronephrosis, not significantly changed from prior renal ultrasound. There is associated cortical thinning suggesting this is a chronic process. 2. Probable 2 cm stone in the upper right kidney. 3. No left hydronephrosis.  Areas of left cortical thinning. Electronically Signed   By: Keith Rake M.D.   On: 10/18/2020 20:04   DG CHEST PORT 1 VIEW  Result Date: 10/30/2020 CLINICAL DATA:  Chest pain EXAM: PORTABLE CHEST 1 VIEW COMPARISON:  10/23/2020 FINDINGS: Left subclavian approach central venous catheter is stable in positioning. Stable cardiomegaly status post CABG. Suspect small bilateral pleural effusions. Mild streaky left basilar opacity. No  pneumothorax. IMPRESSION: 1. Suspect small bilateral pleural effusions. 2. Mild streaky left basilar opacity, favor atelectasis. Electronically Signed   By: Davina Poke D.O.   On: 10/30/2020 14:34   DG CHEST PORT 1 VIEW  Result Date: 10/23/2020 CLINICAL DATA:  Fever. EXAM: PORTABLE CHEST 1 VIEW COMPARISON:  One-view chest x-ray 10/16/2020 FINDINGS: The heart is enlarged. No edema or effusion is present. No focal airspace disease is present. Patient is status post median sternotomy. A left subclavian line is in place. IMPRESSION: No active disease. Electronically Signed   By: San Morelle M.D.   On: 10/23/2020 08:23   DG CHEST PORT 1 VIEW  Result Date: 10/16/2020 CLINICAL DATA:  Central line and enteric catheter placement EXAM: PORTABLE CHEST 1 VIEW COMPARISON:  Chest radiograph dated 1 day prior FINDINGS: The endotracheal tube is in the upper thoracic trachea approximately 7.3 cm above the carina at the level of the clavicular heads. A left central venous catheter tip is at the cavoatrial junction. The enteric catheter tip courses off the field of view, assessed on the separately dictated abdominal radiographs. There is increased opacity in the right lung since the radiographs from 1 day prior. Aeration is otherwise not significantly changed. There is no significant pleural effusion. There is no pneumothorax. No displaced rib fracture is identified. IMPRESSION: 1. Endotracheal tube is in the upper thoracic trachea at the level of the clavicular  heads. 2. Left-sided central venous catheter tip is at the cavoatrial junction. 3. Increased opacity in the right lung may reflect aspiration, pulmonary edema, or possibly contusion given the history of CPR. No displaced rib fracture is identified. Electronically Signed   By: Valetta Mole M.D.   On: 10/16/2020 11:04   DG Chest Port 1 View  Result Date: 10/15/2020 CLINICAL DATA:  Chest pain EXAM: PORTABLE CHEST 1 VIEW COMPARISON:  09/03/2020 FINDINGS:  0555 hours. The cardio pericardial silhouette is enlarged. The lungs are clear without focal pneumonia, edema, pneumothorax or pleural effusion. The visualized bony structures of the thorax show no acute abnormality. Telemetry leads overlie the chest. IMPRESSION: No active disease. Electronically Signed   By: Misty Stanley M.D.   On: 10/15/2020 06:04   DG Abd Portable 1V  Result Date: 10/16/2020 CLINICAL DATA:  Orogastric tube placement. EXAM: PORTABLE ABDOMEN - 1 VIEW COMPARISON:  None. FINDINGS: The bowel gas pattern is normal. Distal tip of nasogastric tube is seen in proximal stomach. No radio-opaque calculi or other significant radiographic abnormality are seen. IMPRESSION: Distal tip of nasogastric tube is seen in proximal stomach. Electronically Signed   By: Marijo Conception M.D.   On: 10/16/2020 11:20   ECHOCARDIOGRAM LIMITED  Result Date: 10/16/2020    ECHOCARDIOGRAM LIMITED REPORT   Patient Name:   RAMEZ ARRONA Date of Exam: 10/16/2020 Medical Rec #:  [id number]         Height:       73.0 in Accession #:    [id number]        Weight:       216.5 lb Date of Birth:  03-Nov-1955         BSA:          2.225 m Patient Age:    26 years          BP:           107/62 mmHg Patient Gender: M                 HR:           78 bpm. Exam Location:  Inpatient Procedure: Limited Echo, Cardiac Doppler and Color Doppler STAT ECHO Indications:    Cardiac arrest  History:        Patient has prior history of Echocardiogram examinations, most                 recent 09/03/2020. CHF, Acute MI and CAD, Prior CABG,                 Arrythmias:Cardiac Arrest and VT arrest, Signs/Symptoms:Anemia,                 Resp. failure, CKD; Risk Factors:Diabetes and Hypertension.  Sonographer:    Dustin Flock RDCS Referring Phys: [id number] [place]  1. Hypokinesis of the inferior wall (base/mid) and apex. NO significant change from echo from [facility] 2022.Marland Kitchen Left ventricular ejection fraction, by estimation, is 50 to  55%. The left ventricle has low normal function. There is moderate left ventricular hypertrophy.  2. Right ventricular systolic function is normal. The right ventricular size is normal. There is normal pulmonary artery systolic pressure.  3. The mitral valve is normal in structure. Mild mitral valve regurgitation.  4. The aortic valve is normal in structure. Aortic valve regurgitation is trivial. Mild aortic valve sclerosis is present, with no evidence of aortic valve stenosis.  5. The inferior vena cava is dilated in  size with <50% respiratory variability, suggesting right atrial pressure of 15 mmHg. FINDINGS  Left Ventricle: Hypokinesis of the inferior wall (base/mid) and apex. NO significant change from echo from [facility] 2022. Left ventricular ejection fraction, by estimation, is 50 to 55%. The left ventricle has low normal function. The left ventricular internal cavity size was normal in size. There is moderate left ventricular hypertrophy. Right Ventricle: The right ventricular size is normal. Right vetricular wall thickness was not assessed. Right ventricular systolic function is normal. There is normal pulmonary artery systolic pressure. The tricuspid regurgitant velocity is 2.71 m/s, and with an assumed right atrial pressure of 3 mmHg, the estimated right ventricular systolic pressure is 93.7 mmHg. Left Atrium: Left atrial size was normal in size. Right Atrium: Right atrial size was normal in size. Pericardium: Trivial pericardial effusion is present. Mitral Valve: The mitral valve is normal in structure. Mild mitral valve regurgitation. Tricuspid Valve: The tricuspid valve is normal in structure. Tricuspid valve regurgitation is trivial. Aortic Valve: The aortic valve is normal in structure. Aortic valve regurgitation is trivial. Mild aortic valve sclerosis is present, with no evidence of aortic valve stenosis. Pulmonic Valve: The pulmonic valve was normal in structure. Pulmonic valve regurgitation is not  visualized. Aorta: The aortic root is normal in size and structure. Venous: The inferior vena cava is dilated in size with less than 50% respiratory variability, suggesting right atrial pressure of 15 mmHg. IAS/Shunts: No atrial level shunt detected by color flow Doppler. LEFT VENTRICLE PLAX 2D LVIDd:         5.40 cm      Diastology LVIDs:         4.50 cm      LV e' medial:    6.31 cm/s LV PW:         1.50 cm      LV E/e' medial:  11.5 LV IVS:        1.60 cm      LV e' lateral:   6.74 cm/s                             LV E/e' lateral: 10.7  LV Volumes (MOD) LV vol d, MOD A4C: 189.0 ml LV vol s, MOD A4C: 128.0 ml LV SV MOD A4C:     189.0 ml LEFT ATRIUM         Index LA diam:    5.00 cm 2.25 cm/m   AORTA Ao Root diam: 3.70 cm MITRAL VALVE               TRICUSPID VALVE MV Area (PHT): 3.87 cm    TR Peak grad:   29.4 mmHg MV Decel Time: 196 msec    TR Vmax:        271.00 cm/s MV E velocity: 72.30 cm/s MV A velocity: 34.40 cm/s MV E/A ratio:  2.10 Dorris Carnes MD Electronically signed by Dorris Carnes MD Signature Date/Time: 10/16/2020/4:46:06 PM    Final     DSK:[id number] EKG 09/04/20 showed NSR 86 bpm, QRS 116 EKG on admission showed NSR QRS 121-122 incomplete left bundle/IVCD  (personally  reviewed)  TELEMETRY: NSR 60-70s (personally reviewed)     Assessment/Plan: VT arrest EF 50-55% post arrest by limited Echo 9/19 in setting of NSTEMI No targets by cath today.  Has been quiescent on amidoarone, occasional PVCs/NSVT IVCD at 122 ms Explained risks, benefits, and alternatives to ICD implantation were discussed including amiodarone alone.  He would prefer to avoid ICD implantation. Discussed the importance of medical compliance and follow up. They have issues primarily with transportation.   2. CAD s/p CABG in 2009 with subsequent PCIs:  Cath today showed severe native 3 vessel CAD, occluded SVG-diag/SVG-RCA, patent SVG-OM and LIMA-LAD, no targets for PCI.   Continue GDMT and maximize anti-ischemic meds.   Unfortunately, non-compliance has been long-standing issue.   3. AKI/ATN on CKD stage 3b:  Cr peaked at 3.89 this admission post arrest.  Cr 1.91 today pre-cath, Baseline previously felt 1.5-1.9  - Continue to avoid nephrotoxic agents - Continue close monitoring    4. HTN:  Continue BB and amlodipine.    5. HLD:  Per gen cards.   We Adelie Croswell make follow up in office for amiodarone follow up and surveillance labs  For questions or updates, please contact [place] Please consult [email or web address] for contact info under Cardiology/STEMI.  Signed, Shirley Friar, PA-C  11/01/2020 3:38 PM   I have seen and examined this patient with Oda Kilts.  Agree with above, note added to reflect my findings.  On exam, RRR, no murmurs.  Patient has a past history of coronary artery disease status post CABG and multiple PCI's, hypertension, hyperlipidemia, type 2 diabetes, CKD stage III.  He presented to the hospital with chest pain.  He was noted to have SVT on 10/16/2020.  He was shocked into slow PEA.  ACLS was performed for 10 to 15 minutes.  He recovered a perfusing rhythm.  He was intubated and moved to the ICU.  Catheterization showed severe three-vessel disease with occluded vein graft to the diagonal and RCA with patent graft to the OM and LAD.  There was no target for PCI.  He has been on amiodarone since his arrest.  I spoken with him at length about the possibility of defibrillator implant versus continued amiodarone.  At this point, he would like to avoid all procedures.  He is adamant about this.  Due to that, we Deona Novitski continue his amiodarone.  At discharge, would plan for 400 mg twice daily for 2 weeks, 400 mg daily, and 200 mg thereafter.  Rion Catala M. Alwin Lanigan MD 11/02/2020 7:53 AM

## 2020-11-01 NOTE — Progress Notes (Signed)
Cath today showed severe native 3 vessel CAD, occluded SVG-diag/SVG-RCA, patent SVG-OM and LIMA-LAD, no targets for PCI.  Will consult EP for ICD evaluation.  Little Ishikawa, MD

## 2020-11-01 NOTE — Care Management Important Message (Signed)
Important Message  Patient Details  Name: Darrell [name] MRN: [id number] Date of Birth: 1955/06/17   Medicare Important Message Given:  Yes     Renie Ora 11/01/2020, 10:29 AM

## 2020-11-01 NOTE — Progress Notes (Signed)
Darrell [name] is a 65 y.o. with a pertinent PMH of CAD status post CABG (2009) and PCI's, chronic systolic heart failure, CKD 3A, hypertension, type 2 diabetes, HLD admitted September 18 with NSTEMI with subsequent V. tach/PEA arrest with successful ROSC, with plans for heart cath pending improvement of renal function secondary to acute tubular necrosis.  Patient had episode of fever up to 103.1 F on 09/26, has remained afebrile since.  Completed treatment for UTI.  Subjective:  NAEON. Patient feeling well and denies any acute concerns.   Objective:  Vital signs in last 24 hours: Vitals:   10/31/20 1629 10/31/20 2012 11/01/20 0014 11/01/20 0446  BP: (!) 175/82 (!) 132/53 (!) 161/80 (!) 160/70  Pulse: 63 (!) 59 70 75  Resp: 17  20 20   Temp: (!) 97.4 F (36.3 C) 98.8 F (37.1 C) 99.4 F (37.4 C) 99.4 F (37.4 C)  TempSrc: Oral Axillary Oral Oral  SpO2: 97% 97% 97% 98%  Weight:      Height:       Physical Exam Constitutional:      General: He is not in acute distress.    Appearance: Normal appearance. He is not ill-appearing.  HENT:     Head: Normocephalic and atraumatic.     Right Ear: External ear normal.     Left Ear: External ear normal.     Nose: Nose normal.     Mouth/Throat:     Mouth: Mucous membranes are moist.     Pharynx: Oropharynx is clear. No oropharyngeal exudate or posterior oropharyngeal erythema.  Eyes:     Extraocular Movements: Extraocular movements intact.     Conjunctiva/sclera: Conjunctivae normal.     Pupils: Pupils are equal, round, and reactive to light.  Cardiovascular:     Rate and Rhythm: Normal rate and regular rhythm.     Pulses: Normal pulses.     Heart sounds: Normal heart sounds. No murmur heard.   No friction rub.  Pulmonary:     Effort: Pulmonary effort is normal.     Breath sounds: Normal breath sounds.  Abdominal:     General: Bowel sounds are normal.     Palpations: Abdomen is soft.  Musculoskeletal:        General: No  swelling, tenderness, deformity or signs of injury. Normal range of motion.     Cervical back: Normal range of motion and neck supple.     Right lower leg: No edema.     Left lower leg: No edema.  Skin:    Capillary Refill: Capillary refill takes less than 2 seconds.     Coloration: Skin is not jaundiced or pale.     Findings: No erythema, lesion or rash.  Neurological:     General: No focal deficit present.     Mental Status: He is alert and oriented to person, place, and time.  Psychiatric:        -At baseline   Assessment/Plan: Darrell [name] is a 65 y.o. with a pertinent PMH of CAD status post CABG (2009) and PCI's, chronic systolic heart failure, CKD 3A, hypertension, type 2 diabetes, HLD admitted September 18 with NSTEMI with subsequent V. tach/PEA arrest with successful ROSC, with plans for heart cath pending improvement of renal function secondary to acute tubular necrosis.  Patient had episode of fever up to 103.1 F on 09/26 2/2 to UTI, has remained afebrile since.  Completed abx course for UTI.    NSTEMI V.Tach arrest HTN - Oral amiodarone  200 mg BID - Asprin and lipitor 80 mg - Plan for cath 10/05  - Continues to be stable or improves.  - Patient hypertensive today at 160s/70s  --Increase amlodipine to 10 mg qd and carvedilol 6.25 mg BID   AKI on CKD III Hyponatremia NAGMA -Secondary to ATN with slow improvement. Strict I/O. Daily weights. UOP 3100  Kidney fxn improved since yesterday at 1.9   -Sodium stable at 134, CO2 improved to 22 from 21. -Continue sodium Bicarb 650 mg BID   IDA (stable) -s/p 2 units on 09/19 and 1 unit on 09/27. Hgb stable with last reading 10/30/20 showing 8.1. Continue to monitor  E. Coli UTI (resolved)  DVT prophx: Sub Q hep 5000 units Diet: Cardiac Bowel: PRN Code:Full Abx: oral keflex  Prior to Admission Living Arrangement: Home Anticipated Discharge Location: Home Barriers to Discharge: Cardiac workup Dispo: Anticipated  discharge in approximately 3 day(s).   [name], MD 11/01/2020, 4:56 AM Pager: [phone] After 5pm on weekdays and 1pm on weekends:[phone]

## 2020-11-01 NOTE — Interval H&P Note (Signed)
Cath Lab Visit (complete for each Cath Lab visit)  Clinical Evaluation Leading to the Procedure:   ACS: Yes.    Non-ACS:    Anginal Classification: CCS IV  Anti-ischemic medical therapy: Minimal Therapy (1 class of medications)  Non-Invasive Test Results: No non-invasive testing performed  Prior CABG: Previous CABG      History and Physical Interval Note:  11/01/2020 12:49 PM  Darrell [name]  has presented today for surgery, with the diagnosis of VT arrest.  The various methods of treatment have been discussed with the patient and family. After consideration of risks, benefits and other options for treatment, the patient has consented to  Procedure(s): LEFT HEART CATH AND CORS/GRAFTS ANGIOGRAPHY (N/A) as a surgical intervention.  The patient's history has been reviewed, patient examined, no change in status, stable for surgery.  I have reviewed the patient's chart and labs.  Questions were answered to the patient's satisfaction.     Tonny Bollman

## 2020-11-02 ENCOUNTER — Other Ambulatory Visit: Payer: Self-pay

## 2020-11-02 DIAGNOSIS — I10 Essential (primary) hypertension: Secondary | ICD-10-CM | POA: Diagnosis not present

## 2020-11-02 DIAGNOSIS — N1832 Chronic kidney disease, stage 3b: Secondary | ICD-10-CM | POA: Diagnosis not present

## 2020-11-02 DIAGNOSIS — I469 Cardiac arrest, cause unspecified: Secondary | ICD-10-CM | POA: Diagnosis not present

## 2020-11-02 DIAGNOSIS — I214 Non-ST elevation (NSTEMI) myocardial infarction: Secondary | ICD-10-CM | POA: Diagnosis not present

## 2020-11-02 LAB — BASIC METABOLIC PANEL
Anion gap: 7 (ref 5–15)
BUN: 21 mg/dL (ref 8–23)
CO2: 23 mmol/L (ref 22–32)
Calcium: 8.2 mg/dL — ABNORMAL LOW (ref 8.9–10.3)
Chloride: 106 mmol/L (ref 98–111)
Creatinine, Ser: 1.83 mg/dL — ABNORMAL HIGH (ref 0.61–1.24)
GFR, Estimated: 41 mL/min — ABNORMAL LOW (ref >60)
Glucose, Bld: 145 mg/dL — ABNORMAL HIGH (ref 70–99)
Potassium: 4.4 mmol/L (ref 3.5–5.1)
Sodium: 136 mmol/L (ref 135–145)

## 2020-11-02 LAB — GLUCOSE, CAPILLARY
Glucose-Capillary: 106 mg/dL — ABNORMAL HIGH (ref 70–99)
Glucose-Capillary: 154 mg/dL — ABNORMAL HIGH (ref 70–99)
Glucose-Capillary: 107 mg/dL — ABNORMAL HIGH (ref 70–99)

## 2020-11-02 MED ORDER — AMLODIPINE BESYLATE 10 MG PO TABS
10.0000 mg | ORAL_TABLET | Freq: Every day | ORAL | 1 refills | Status: DC
  Filled 2020-11-02: qty 30, 30d supply, fill #0
  Filled 2020-12-01: qty 30, 30d supply, fill #1

## 2020-11-02 MED ORDER — ATORVASTATIN CALCIUM 80 MG PO TABS
80.0000 mg | ORAL_TABLET | Freq: Every evening | ORAL | 1 refills | Status: DC
  Filled 2020-11-02: qty 30, 30d supply, fill #0
  Filled 2020-12-01: qty 30, 30d supply, fill #1

## 2020-11-02 MED ORDER — CARVEDILOL 6.25 MG PO TABS
6.2500 mg | ORAL_TABLET | Freq: Two times a day (BID) | ORAL | 0 refills | Status: DC
  Filled 2020-11-02: qty 60, 30d supply, fill #0

## 2020-11-02 MED ORDER — ASPIRIN EC 81 MG PO TBEC
81.0000 mg | DELAYED_RELEASE_TABLET | Freq: Every day | ORAL | Status: DC
  Administered 2020-11-02: 81 mg via ORAL
  Filled 2020-11-02: qty 1

## 2020-11-02 MED ORDER — AMIODARONE HCL 200 MG PO TABS: 200.0000 mg | ORAL_TABLET | Freq: Every day | ORAL | Status: DC

## 2020-11-02 MED ORDER — AMIODARONE HCL 200 MG PO TABS: 200.0000 mg | ORAL_TABLET | Freq: Two times a day (BID) | ORAL | Status: DC

## 2020-11-02 MED ORDER — AMIODARONE HCL 200 MG PO TABS
ORAL_TABLET | ORAL | 0 refills | Status: DC
  Filled 2020-11-02: qty 56, 43d supply, fill #0

## 2020-11-02 MED ORDER — METFORMIN HCL 500 MG PO TABS
500.0000 mg | ORAL_TABLET | Freq: Every day | ORAL | 1 refills | Status: AC
  Filled 2020-11-02: qty 30, 30d supply, fill #0
  Filled 2020-12-01: qty 30, 30d supply, fill #1

## 2020-11-02 MED ORDER — ASPIRIN 81 MG PO TBEC
81.0000 mg | DELAYED_RELEASE_TABLET | Freq: Every day | ORAL | 1 refills | Status: AC
  Filled 2020-11-02: qty 30, 30d supply, fill #0

## 2020-11-02 NOTE — TOC Transition Note (Signed)
Transition of Care (TOC) - CM/SW Discharge Note Donn Pierini RN, BSN Transitions of Care Unit 4E- RN Case Manager See Treatment Team for direct phone #    Patient Details  Name: Darrell [name] MRN: [id number] Date of Birth: 1955-09-07  Transition of Care [facility]) CM/SW Contact:  Darrold Span, RN Phone Number: 11/02/2020, 4:14 PM   Clinical Narrative:    Pt stable for transition home today, noted recommendations by PT for HHPT f/u. CM went to speak with pt and wife. Discussed with pt recommendations- however pt is declining- states he does not want HH services. (Wife reports she wishes he would take them- but "he is stubborn")  Pt does report that he would need assistance with transportation home- [employer]. Unit secretary can call [facility] for assistance when pt ready.   Pt reports he has needed DME at home, PCP at [facility], one last offer to reconsider [facility]- again pt politely declined.    Final next level of care: Home/Self Care Barriers to Discharge: No Barriers Identified   Patient Goals and CMS Choice Patient states their goals for this hospitalization and ongoing recovery are:: return home CMS Medicare.gov Compare Post Acute Care list provided to:: Patient Choice offered to / list presented to : Patient  Discharge Placement                 Home      Discharge Plan and Services     Post Acute Care Choice: Home Health          DME Arranged: N/A DME Agency: NA       HH Arranged: Refused HH HH Agency: NA        Social Determinants of Health (SDOH) Interventions     Readmission Risk Interventions Readmission Risk Prevention Plan 11/02/2020 09/06/2020  Transportation Screening Complete Complete  PCP or Specialist Appt within 3-5 Days - Complete  HRI or Home Care Consult - Complete  Social Work Consult for Recovery Care Planning/Counseling - Complete  Palliative Care Screening - Not Applicable  Medication Review Oceanographer) Complete Complete   HRI or Home Care Consult Complete -  SW Recovery Care/Counseling Consult Complete -  Palliative Care Screening Not Applicable -  Skilled Nursing Facility Not Applicable -  Some recent data might be hidden

## 2020-11-02 NOTE — Progress Notes (Signed)
Progress Note  Patient Name: Darrell [name] Date of Encounter: 11/02/2020  [facility] Cardiologist: Minus Breeding, MD   Subjective   Creatinine mildly improved today, 1.8.  BP 153/78.  He denies any chest pain or dyspnea.  Inpatient Medications    Scheduled Meds:  amiodarone  200 mg Oral BID   amLODipine  10 mg Oral Daily   atorvastatin  80 mg Oral QPM   carvedilol  6.25 mg Oral BID WC   chlorhexidine gluconate (MEDLINE KIT)  15 mL Mouth Rinse BID   Chlorhexidine Gluconate Cloth  6 each Topical Daily   heparin injection (subcutaneous)  5,000 Units Subcutaneous Q8H   insulin aspart  0-15 Units Subcutaneous TID WC   pantoprazole  40 mg Oral Daily   sodium bicarbonate  650 mg Oral BID   sodium chloride flush  3 mL Intravenous Q12H   sodium chloride flush  3 mL Intravenous Q12H   Continuous Infusions:  sodium chloride     PRN Meds: sodium chloride, acetaminophen, albuterol, alum & mag hydroxide-simeth, nitroGLYCERIN, ondansetron (ZOFRAN) IV, sodium chloride flush   Vital Signs    Vitals:   11/01/20 2353 11/02/20 0402 11/02/20 0721 11/02/20 1024  BP: 140/71 (!) 159/68 (!) 152/76 (!) 153/78  Pulse: 63 60 64 64  Resp: $Remo'20 20 19   'MKMyw$ Temp: 98.4 F (36.9 C) 98.3 F (36.8 C) 98.3 F (36.8 C)   TempSrc: Axillary Axillary Oral   SpO2: 95% 95% 93%   Weight:  99.5 kg    Height:        Intake/Output Summary (Last 24 hours) at 11/02/2020 1032 Last data filed at 11/02/2020 1027 Gross per 24 hour  Intake 483 ml  Output 2350 ml  Net -1867 ml    Last 3 Weights 11/02/2020 11/01/2020 10/31/2020  Weight (lbs) 219 lb 5.7 oz 221 lb 1.6 oz 229 lb 8 oz  Weight (kg) 99.5 kg 100.29 kg 104.101 kg      Telemetry    Sinus rhythm rate 60s to 70s- Personally Reviewed  ECG    No new tracings - Personally Reviewed  Physical Exam   GEN: Chronically ill appearing gentleman sitting in bed in acute distress.   Neck: No JVD Cardiac: RRR, no murmurs, rubs, or gallops.   Respiratory: Clear to auscultation bilaterally. GI: Soft, nontender, non-distended  MS: No edema; No deformity. Neuro:  Nonfocal  Psych: Normal affect   Labs    High Sensitivity Troponin:   Recent Labs  Lab 10/15/20 0502 10/15/20 0750 10/16/20 0953 10/16/20 1143  TROPONINIHS 685* 902* 6,892* 6,596*      Chemistry Recent Labs  Lab 10/27/20 0117 10/28/20 0430 10/30/20 0420 10/31/20 0325 11/01/20 0222 11/02/20 0126  NA 132* 132*   < > 134* 134* 136  K 4.4 4.5   < > 4.7 4.3 4.4  CL 106 105   < > 104 106 106  CO2 17* 20*   < > 21* 22 23  GLUCOSE 137* 138*   < > 101* 89 145*  BUN 34* 28*   < > 27* 22 21  CREATININE 2.53* 2.21*   < > 2.03* 1.91* 1.83*  CALCIUM 8.1* 8.0*   < > 8.2* 8.1* 8.2*  MG 1.9 2.0  --   --   --   --   GFRNONAA 28* 32*   < > 36* 39* 41*  ANIONGAP 9 7   < > $R'9 6 7   'Ns$ < > = values in this interval not  displayed.     Lipids No results for input(s): CHOL, TRIG, HDL, LABVLDL, LDLCALC, CHOLHDL in the last 168 hours.  Hematology Recent Labs  Lab 10/28/20 0430 10/30/20 0420 11/01/20 0504  WBC 7.5 7.9 5.3  RBC 3.65* 3.54* 3.76*  HGB 8.5* 8.1* 8.7*  HCT 29.2* 28.6* 29.2*  MCV 80.0 80.8 77.7*  MCH 23.3* 22.9* 23.1*  MCHC 29.1* 28.3* 29.8*  RDW 20.2* 20.4* 20.1*  PLT 214 269 301    Thyroid No results for input(s): TSH, FREET4 in the last 168 hours.  BNPNo results for input(s): BNP, PROBNP in the last 168 hours.  DDimer No results for input(s): DDIMER in the last 168 hours.   Radiology    CARDIAC CATHETERIZATION  Result Date: 11/01/2020   Mid LAD to Dist LAD lesion is 90% stenosed with 100% stenosed side branch in Ost 3rd Sept.   Ost LAD to Prox LAD lesion is 100% stenosed.   Mid Cx to Dist Cx lesion is 100% stenosed.   Mid RCA lesion is 100% stenosed.   Ost RCA to Prox RCA lesion is 99% stenosed.   Origin to Prox Graft lesion is 100% stenosed.   Origin to Prox Graft lesion is 100% stenosed.   Ost Cx to Prox Cx lesion is 100% stenosed. Severe native  3 vessel CAD Total occlusion of the LAD with severe diffuse disease beyond the LIMA insertion Total occlusion of the LCx Total occlusion of the RCA, distal branches collateralized by septal perforator branches of the LAD visualized on the LIMA injections 2. S/P CABG with known occlusion of the SVG-diag and SVG-RCA, continued patency of the SVG-OM and LIMA-LAD No targets for PCI. Recommend medical therapy. Consider EP eval for ICD, d/w Primary cardiology service.    Cardiac Studies   Limited echo 10/16/20: 1. Hypokinesis of the inferior wall (base/mid) and apex. NO significant  change from echo from AUg 2022.Marland Kitchen Left ventricular ejection fraction, by  estimation, is 50 to 55%. The left ventricle has low normal function.  There is moderate left ventricular  hypertrophy.   2. Right ventricular systolic function is normal. The right ventricular  size is normal. There is normal pulmonary artery systolic pressure.   3. The mitral valve is normal in structure. Mild mitral valve  regurgitation.   4. The aortic valve is normal in structure. Aortic valve regurgitation is  trivial. Mild aortic valve sclerosis is present, with no evidence of  aortic valve stenosis.   5. The inferior vena cava is dilated in size with <50% respiratory  variability, suggesting right atrial pressure of 15 mmHg.   CARDIAC CATH: 05/04/2018 Mid RCA lesion is 100% stenosed. Ost RCA to Prox RCA lesion is 99% stenosed. Ost LAD to Prox LAD lesion is 100% stenosed. Mid Cx to Dist Cx lesion is 100% stenosed. Origin to Prox Graft lesion is 100% stenosed. Mid LAD to Dist LAD lesion is 90% stenosed with 100% stenosed side branch in Ost 3rd Sept. Origin to Prox Graft lesion is 100% stenosed. Ost Cx to Prox Cx lesion is 99% stenosed. Scoring balloon angioplasty was performed using a BALLOON WOLVERINE 2.75X10. Post intervention, there is a 30% residual stenosis. The left ventricular ejection fraction is 50-55% by visual estimate. LV  end diastolic pressure is normal. IMPRESSION: Mr. Tawil had anatomy fairly similar to what it was in 2017.  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  The mid to distal AV groove circumflex stent was  occluded at this point whereas 3 years ago it was subtotally occluded.  His distal LAD beyond LIMA insertion was diffusely and severely diseased as well.     I performed Cutting Balloon atherectomy reducing a 99% lesion to less than 30%.  Patient tolerated procedure well.  He was already on aspirin Plavix.  Angiomax was turned off.  He left the lab in stable condition on IV nitroglycerin for blood pressure control.  The sheath will be removed and pressure held.  Patient will be gently hydrated and most likely discharged home tomorrow on aspirin and Plavix.  Intervention     Patient Profile     65 y.o. male with PMH CAD s/p CABG and prior PCI, hypertension, hyperlipidemia, type II diabetes, chronic kidney disease who presented with chest pain, then had VT arrest 10/16/20.  Assessment & Plan    1. NSTEMI/VT arrest in patient with history of CAD s/p CABG in 2009 with subsequent PCIs: patient presented with VT arrest. He was started on IV amiodarone without recurrence though does continue to have intermittent PVCs. Amiodarone rate reduced 10/24/20 due to bradycardia concerns. AKI limited ischemic evaluation earlier in admission.  Creatinine improved to baseline and underwent left heart catheterization on 10/5.  Cath today showed severe native 3 vessel CAD, occluded SVG-diag/SVG-RCA, patent SVG-OM and LIMA-LAD, no targets for PCI.  -EP consulted for ICD.  Patient declines ICD.  Recommend continuing amiodarone.  Would continue 200 mg BID x 2 weeks then decrease to 200 mg daily - Continue aspirin and statin - Continue carvedilol   2. AoCKD stage 3b: Cr peaked at 3.89, down to 1.83 today. Baseline Cr 1.5-1.9 - Continue to avoid nephrotoxic  agents - Continue close monitoring    3. HTN: BP generally above goal. Has had some intermittent bradycardia on telemetry so will avoid further titration of carvedilol.  - Continue amlodipine, dose increased to $RemoveBefo'10mg'BzqYnwbhTUH$  daily - Continue carvedilol   4. HLD: LDL 120 09/03/20 - Continue atorvastatin    [facility] will sign off.   Medication Recommendations: Aspirin 81 mg daily, atorvastatin 80 mg daily, carvedilol 6.25 mg twice daily, amlodipine 10 mg daily, amiodarone 200 mg twice daily x2 weeks then decrease to 200 mg daily Other recommendations (labs, testing, etc): None Follow up as an outpatient: Will schedule with general cardiology.  Has appointment with EP 11/21.   For questions or updates, please contact [place] Please consult [email or web address] for contact info under        Signed, Donato Heinz, MD  11/02/2020, 10:32 AM

## 2020-11-02 NOTE — Progress Notes (Signed)
OT Cancellation Note  Patient Details Name: Darrell [name] MRN: [id number] DOB: 27-Apr-1955   Cancelled Treatment:    Reason Eval/Treat Not Completed: Other (comment) Attempted OT session this PM with pt s/p cardiac cath yesterday. On entry, pt awake in bed. With therapist encouragement for OOB activities, pt closed eyes and ignored therapist for remainder of time in room. Wife present and encouraged pt to participate as well. Note this is 2nd consecutive refusal for OT. Will sign off if pt declines on next attempt.   Lorre Munroe 11/02/2020, 2:16 PM

## 2020-11-02 NOTE — Progress Notes (Signed)
Discharge instructions provided to patient. Medications and follow-up appointments reviewed. All questions answered. IVs removed. Patient and his wife to be escorted home by [employer].   Allegra Grana RN

## 2020-11-03 ENCOUNTER — Other Ambulatory Visit: Payer: Self-pay

## 2020-11-03 ENCOUNTER — Telehealth: Payer: Self-pay

## 2020-11-03 NOTE — Telephone Encounter (Signed)
Transition Care Management Unsuccessful Follow-up Telephone Call  Date of discharge and from where:  Darrell [name] 11/02/2020  Attempts:  1st Attempt  Reason for unsuccessful TCM follow-up call:  Unable to reach patient, sister answered no DPR on file , stated to call [phone] to reach his other brother he might be with him . Called [phone] call not answered.

## 2020-11-06 ENCOUNTER — Other Ambulatory Visit: Payer: Self-pay

## 2020-11-06 ENCOUNTER — Other Ambulatory Visit: Payer: Self-pay | Admitting: *Deleted

## 2020-11-06 ENCOUNTER — Telehealth: Payer: Self-pay

## 2020-11-06 DIAGNOSIS — I214 Non-ST elevation (NSTEMI) myocardial infarction: Secondary | ICD-10-CM

## 2020-11-06 NOTE — Telephone Encounter (Signed)
Transition Care Management Unsuccessful Follow-up Telephone Call  Date of discharge and from where:  11/02/2020, [facility]   Attempts:  2nd Attempt  Reason for unsuccessful TCM follow-up call:  Left voice message [phone] with patient's daughter, Taniela Feltus.  She said she was not with the patient and would have him call this CM back when she sees him.  Needs to schedule hospital follow up appointment

## 2020-11-06 NOTE — Patient Outreach (Signed)
[facility]) Care Management  11/06/2020  Darrell [name] [birth year] [id number]   Referral Date: 10/10 Referral Source: Hospital liaison Referral Reason: Recent hospital discharge, 3 hospitalization in the last 6 months Insurance: DCE   Outreach attempt #1, unsuccessful.  Member's wife number state not accepting calls.  Noted that PCP office called listed home number, which is daughter's number, today without success.  This RNCM also called member's listed mobile number, which is son's number, HIPAA compliant voice message left.    Plan: RN CM will send outreach letter and follow up within the next 3-4 business days.  [name], [place], MSN [facility] Care Management  [facility] Manager [phone]

## 2020-11-07 ENCOUNTER — Telehealth: Payer: Self-pay

## 2020-11-07 ENCOUNTER — Other Ambulatory Visit: Payer: Self-pay

## 2020-11-07 NOTE — Telephone Encounter (Signed)
Transition Care Management Unsuccessful Follow-up Telephone Call  Date of discharge and from where:  11/02/2020, [facility]   Attempts:  3rd Attempt  Reason for unsuccessful TCM follow-up call:  Left voice message on # [phone].call back requested to this CM.   Called # [phone], voicemail full, unable to leave a message. This is also the number for patient's daughter, Elmarie Shiley.  Call placed to # [phone], the phone rings and then the recording states the call cannot be completed at this time . This is the number listed for patient's spouse, Aggie Cosier.   Letter sent to patient requesting he contact [facility] to schedule a follow up appointment.

## 2020-11-10 ENCOUNTER — Other Ambulatory Visit: Payer: Self-pay | Admitting: *Deleted

## 2020-11-10 NOTE — Patient Outreach (Signed)
[facility]) Care Management  11/10/2020  Darrell [name] [birth year] [id number]   Referral Date: 10/10 Referral Source: Hospital liaison Referral Reason: Recent hospital discharge, 3 hospitalization in the last 6 months Insurance: DCE   Outreach attempt #2, unsuccessful.  Wife's number continue to state number can't be completed as dialed.  Daughter answered phone but state she is not with member.  State she will call her brother and send him to [facility] home to tell him to call this RNCM and/or PCP office as they too have been unsuccessful with contacting member.  Plan: RN CM will follow up within the next 3-4 business days.  [name], [place], MSN [facility] Care Management  [facility] Manager [phone]

## 2020-11-15 ENCOUNTER — Other Ambulatory Visit: Payer: Self-pay | Admitting: *Deleted

## 2020-11-15 NOTE — Patient Outreach (Signed)
[facility]) Care Management  11/15/2020  Darrell [name] 21-Apr-1955 [id number]    Referral Date: 10/10 Referral Source: Hospital liaison Referral Reason: Recent hospital discharge, 3 hospitalization in the last 6 months Insurance: DCE   Outreach attempt #3, successful to son.  He provided a new number for member [phone]).  Call placed to that number, no answer, HIPAA compliant voice message left.  Plan: RN CM will follow up within the next 3-4 business days.  Will provide updated number to Durene Cal, RNCM with PCP office as she has also been unsuccessful with contacting member.  [name], [place], MSN [facility] Care Management  [facility] Manager [phone]

## 2020-11-20 ENCOUNTER — Other Ambulatory Visit: Payer: Self-pay | Admitting: *Deleted

## 2020-11-20 NOTE — Patient Outreach (Signed)
[facility]) Care Management  11/20/2020  RAJAH TAGLIAFERRO 1955/12/31 [id number]   Referral Date: 10/10 Referral Source: Hospital liaison Referral Reason: Recent hospital discharge, 3 hospitalization in the last 6 months Insurance: DCE   Outreach attempt #4 unsuccessful, HIPAA compliant voice message left.   Plan: RN CM will follow up within the next 3 weeks with 5th and final attempt.  If remain unsuccessful, will close case due to inability to establish contact.  [name], [place], MSN [facility] Care Management  [facility] Manager [phone]

## 2020-12-01 ENCOUNTER — Other Ambulatory Visit: Payer: Self-pay

## 2020-12-01 ENCOUNTER — Other Ambulatory Visit: Payer: Self-pay | Admitting: Family Medicine

## 2020-12-01 NOTE — Telephone Encounter (Signed)
Requested medications are due for refill today no  Requested medications are on the active medication list yes  Last refill 11/07/20  Last visit 09/10/19  Future visit scheduled no  Notes to clinic rx not written by provider in this practice, requesting early, last visit was 08/2019, labs failed protocol due to being greater than 180 days ago.Amiodarone not delegated. Please assess.  Requested Prescriptions  Pending Prescriptions Disp Refills   amiodarone (PACERONE) 200 MG tablet 56 tablet 0    Sig: Take 1 tablet (200 mg total) by mouth 2 (two) times daily for 13 days, THEN 1 tablet (200 mg total) daily.     Not Delegated - Cardiovascular: Antiarrhythmic Agents - amiodarone Failed - 12/01/2020  3:09 PM      Failed - This refill cannot be delegated      Failed - TSH in normal range and within 180 days    TSH  Date Value Ref Range Status  05/29/2020 1.151 0.350 - 4.500 uIU/mL Final    Comment:    Performed by a 3rd Generation assay with a functional sensitivity of <=0.01 uIU/mL. Performed at [facility], [address], [place], [place] [address]   10/24/2012 1.228 0.350 - 4.500 uIU/mL Final    Comment:    Performed at [employer] [facility]          Failed - Ca in normal range and within 180 days    Calcium  Date Value Ref Range Status  11/02/2020 8.2 (L) 8.9 - 10.3 mg/dL Final   Calcium, Ion  Date Value Ref Range Status  10/16/2020 1.10 (L) 1.15 - 1.40 mmol/L Final          Failed - Last BP in normal range    BP Readings from Last 1 Encounters:  11/02/20 (!) 148/91          Failed - Valid encounter within last 6 months    Recent Outpatient Visits           1 year ago Hospital discharge follow-up   [facility] Marcine Matar, [place]   1 year ago Screening for malignant neoplasm of prostate   [facility] [place], Cari S, [place]   2 years ago ACS (acute coronary syndrome) [facility])   [facility] [place], Odette Horns, MD   2 years ago Type 2 diabetes mellitus with other circulatory complication, without long-term current use of insulin (HCC)   [facility] [place], [place], [place]   4 years ago Type 2 diabetes mellitus without complication, without long-term current use of insulin (HCC)   [facility] Hoy Register, MD       Future Appointments             In 3 days Cleaver, Thomasene Ripple, NP [facility], [facility]   In 2 weeks Lanna Poche, Mariam Dollar, PA-C [facility] [address] Office, [facility]            Passed - Mg Level in normal range and within 180 days    Magnesium  Date Value Ref Range Status  10/28/2020 2.0 1.7 - 2.4 mg/dL Final    Comment:    Performed at [facility], [address] [place], [place] [address]          Passed - K in normal range and within 180 days    Potassium  Date Value Ref  Range Status  11/02/2020 4.4 3.5 - 5.1 mmol/L Final          Passed - AST in normal range and within 180 days    AST  Date Value Ref Range Status  09/04/2020 20 15 - 41 U/L Final          Passed - ALT in normal range and within 180 days    ALT  Date Value Ref Range Status  09/04/2020 17 0 - 44 U/L Final          Passed - Patient had ECG in the last 180 days      Passed - Last Heart Rate in normal range    Pulse Readings from Last 1 Encounters:  11/02/20 60           carvedilol (COREG) 6.25 MG tablet 60 tablet 0    Sig: Take 1 tablet (6.25 mg total) by mouth 2 (two) times daily with a meal.     Cardiovascular:  Beta Blockers Failed - 12/01/2020  3:09 PM      Failed - Last BP in normal range    BP Readings from Last 1 Encounters:  11/02/20 (!) 148/91          Failed - Valid encounter within last 6 months    Recent Outpatient Visits           1 year ago Hospital discharge follow-up   [facility] Marcine Matar, [place]   1  year ago Screening for malignant neoplasm of prostate   [facility] [place], Cari S, [place]   2 years ago ACS (acute coronary syndrome) [facility])   [facility] [place], Odette Horns, MD   2 years ago Type 2 diabetes mellitus with other circulatory complication, without long-term current use of insulin (HCC)   [facility] [place], [place], [place]   4 years ago Type 2 diabetes mellitus without complication, without long-term current use of insulin (HCC)   [facility] Hoy Register, MD       Future Appointments             In 3 days Cleaver, Thomasene Ripple, NP [facility] [place], [facility]   In 2 weeks Lanna Poche, Mariam Dollar, PA-C [facility] [address] Office, [facility]            Passed - Last Heart Rate in normal range    Pulse Readings from Last 1 Encounters:  11/02/20 60

## 2020-12-03 NOTE — Progress Notes (Deleted)
Cardiology Clinic Note   Patient Name: Darrell [name] Date of Encounter: 12/03/2020  Primary Care Provider:  Hoy Register, MD Primary Cardiologist:  [facility]rell Rotunda, MD  Patient Profile    Darrell [name] 65 year old male presents the clinic today for follow-up evaluation of his coronary artery disease and VT arrest 10/16/2020  Past Medical History    Past Medical History:  Diagnosis Date   CKD (chronic kidney disease), stage III (HCC)    Claudication (HCC)    Coronary artery disease    a. 08/2001 s/p DES to RCA/RI;  b. 07/2006 s/p DES to p/d LCX;  c. 11/2007 CABGx5 (LIMA->LAD, VG->D1, LRA->OM1, VG->PDA->LPL); c. 03/2015: Synergy DES to mid-Cx in the setting of NSTEMI; d. 11/2015 NSTEMI->Med Rx. e. NSTEMI 01/2018 -> med rx.   Diabetes mellitus type 2 in nonobese [facility])    a. A1c 7.0 09/2012.   Diastolic dysfunction    a. 02/2016 Echo: EF 50-55%, gr1DD, Ao sclerosis, dil Ao root (11mm), sev dil LA, triv TR, PASP .   Hyperlipidemia    Hypertension    Noncompliance    Right carotid bruit    a. noted 01/2018, will need OP eval when patient complies with follow-up.   Past Surgical History:  Procedure Laterality Date   CARDIAC CATHETERIZATION N/A 04/06/2015   Procedure: Left Heart Cath and Cors/Grafts Angiography;  Surgeon: Darrell Gess, MD;  Location: [facility] INVASIVE CV LAB;  Service: Cardiovascular;  Laterality: N/A;   CARDIAC CATHETERIZATION N/A 04/06/2015   Procedure: Coronary Stent Intervention;  Surgeon: Darrell Gess, MD;  2.5 mm x 12 mm long Synergy DES followed by  2.5 mm x 16 mm long Synergy DES     CORONARY ARTERY BYPASS GRAFT  2009   2009 LIMA to LAD, SVG to Diag, SVG to PDA and PL, left radial to OM   CORONARY BALLOON ANGIOPLASTY N/A 05/04/2018   Procedure: CORONARY BALLOON ANGIOPLASTY;  Surgeon: [facility]rell Gess, MD;  Location: [facility] INVASIVE CV LAB;  Service: Cardiovascular;  Laterality: N/A;  Prox CFX   LEFT HEART CATH N/A 10/24/2012   Procedure: LEFT HEART  CATH;  Surgeon: Darrell Bihari, MD;  Location: [facility] CATH LAB;  Service: Cardiovascular;  Laterality: N/A;   LEFT HEART CATH AND CORS/GRAFTS ANGIOGRAPHY N/A 05/04/2018   Procedure: LEFT HEART CATH AND CORS/GRAFTS ANGIOGRAPHY;  Surgeon: [facility]rell Gess, MD;  Location: [facility] INVASIVE CV LAB;  Service: Cardiovascular;  Laterality: N/A;   LEFT HEART CATH AND CORS/GRAFTS ANGIOGRAPHY N/A 11/01/2020   Procedure: LEFT HEART CATH AND CORS/GRAFTS ANGIOGRAPHY;  Surgeon: [facility]rell Bollman, MD;  Location: [facility] INVASIVE CV LAB;  Service: Cardiovascular;  Laterality: N/A;    Allergies  No Known Allergies  History of Present Illness    [facility] [name] has a PMH of coronary artery disease status post CABG and prior PCI, HTN, HLD, type 2 diabetes, and CKD.  CABG in 2009 with subsequent PCI's.  He presented to the hospital on 10/15/2020.  He reported chest discomfort and then had VT arrest on 10/16/2020.  He was placed on IV amiodarone and noted to have intermittent PVCs.  His amiodarone was reduced on 10/24/2020 due to his bradycardia.  Acute kidney injury limiting his ischemic evaluation.  His creatinine improved to its baseline and he underwent cardiac catheterization 10/522.  It showed native three-vessel coronary artery disease with occluded SVG-diagonal, SVG-RCA, patent SVG-OM1, and LIMA-LAD with no targets for PCI.  EP was consulted for ICD.  Patient declined ICD placement.  It  was recommended that he continue on amiodarone.  Plan was made for continued 200 mg twice daily dosing x2 weeks then decreased dosing of 200 mg daily.  He was discharged in stable condition on 11/02/2020.  He presents the clinic today for follow-up evaluation states***  *** denies chest pain, shortness of breath, lower extremity edema, fatigue, palpitations, melena, hematuria, hemoptysis, diaphoresis, weakness, presyncope, syncope, orthopnea, and PND.     Home Medications    Prior to Admission medications   Medication Sig Start Date End  Date Taking? Authorizing Provider  amiodarone (PACERONE) 200 MG tablet Take 1 tablet (200 mg total) by mouth 2 (two) times daily for 13 days, THEN 1 tablet (200 mg total) daily. 11/02/20 12/20/20  Darrell Persia, MD  amLODipine (NORVASC) 10 MG tablet Take 1 tablet (10 mg total) by mouth daily. Please make PCP appointment. 11/02/20   Darrell Persia, MD  aspirin 81 MG EC tablet Take 1 tablet (81 mg total) by mouth daily. Swallow whole. 11/02/20   Darrell Persia, MD  atorvastatin (LIPITOR) 80 MG tablet Take 1 tablet (80 mg total) by mouth every evening. 11/02/20   Darrell Persia, MD  carvedilol (COREG) 6.25 MG tablet Take 1 tablet (6.25 mg total) by mouth 2 (two) times daily with a meal. 11/02/20   Darrell Persia, MD  metFORMIN (GLUCOPHAGE) 500 MG tablet Take 1 tablet (500 mg total) by mouth daily with breakfast. 11/02/20 01/06/21  Darrell Persia, MD  nitroGLYCERIN (NITROSTAT) 0.4 MG SL tablet Place 1 tablet (0.4 mg total) under the tongue every 5 (five) minutes as needed for chest pain. 09/06/20 12/08/20  Elgergawy, Darrell Huguenin, MD    Family History    Family History  Problem Relation Age of Onset   Heart failure Mother    Cancer Mother    CAD Mother 45   Heart failure Father    Kidney failure Brother    He indicated that his mother is deceased. He indicated that his father is deceased. He indicated that the status of his brother is unknown. He indicated that his maternal grandmother is deceased. He indicated that his maternal grandfather is deceased. He indicated that his paternal grandmother is deceased. He indicated that his paternal grandfather is deceased.  Social History    Social History   Socioeconomic History   Marital status: Married    Spouse name: Not on file   Number of children: 3   Years of education: Not on file   Highest education level: Not on file  Occupational History   Not on file  Tobacco Use   Smoking status: Never   Smokeless tobacco: Never  Vaping Use   Vaping  Use: Never used  Substance and Sexual Activity   Alcohol use: No    Comment: sts he drank for the first time in a long time last night    Drug use: No   Sexual activity: Not on file  Other Topics Concern   Not on file  Social History Narrative   Lives at home with wife.     Social Determinants of Health   Financial Resource Strain: Not on file  Food Insecurity: Not on file  Transportation Needs: Not on file  Physical Activity: Not on file  Stress: Not on file  Social Connections: Not on file  Intimate Partner Violence: Not on file     Review of Systems    General:  No chills, fever, night sweats or weight changes.  Cardiovascular:  No chest pain, dyspnea on  exertion, edema, orthopnea, palpitations, paroxysmal nocturnal dyspnea. Dermatological: No rash, lesions/masses Respiratory: No cough, dyspnea Urologic: No hematuria, dysuria Abdominal:   No nausea, vomiting, diarrhea, bright red blood per rectum, melena, or hematemesis Neurologic:  No visual changes, wkns, changes in mental status. All other systems reviewed and are otherwise negative except as noted above.  Physical Exam    VS:  There were no vitals taken for this visit. , BMI There is no height or weight on file to calculate BMI. GEN: Well nourished, well developed, in no acute distress. HEENT: normal. Neck: Supple, no JVD, carotid bruits, or masses. Cardiac: RRR, no murmurs, rubs, or gallops. No clubbing, cyanosis, edema.  Radials/DP/PT 2+ and equal bilaterally.  Respiratory:  Respirations regular and unlabored, clear to auscultation bilaterally. GI: Soft, nontender, nondistended, BS + x 4. MS: no deformity or atrophy. Skin: warm and dry, no rash. Neuro:  Strength and sensation are intact. Psych: Normal affect.  Accessory Clinical Findings    Recent Labs: 05/29/2020: TSH 1.151 09/03/2020: B Natriuretic Peptide 1,578.3 09/04/2020: ALT 17 10/28/2020: Magnesium 2.0 11/01/2020: Hemoglobin 8.7; Platelets  301 11/02/2020: BUN 21; Creatinine, Ser 1.83; Potassium 4.4; Sodium 136   Recent Lipid Panel    Component Value Date/Time   CHOL 167 09/03/2020 0529   TRIG 43 09/03/2020 0529   HDL 38 (L) 09/03/2020 0529   CHOLHDL 4.4 09/03/2020 0529   VLDL 9 09/03/2020 0529   LDLCALC 120 (H) 09/03/2020 0529    ECG personally reviewed by me today- *** - No acute changes  Echocardiogram 10/16/2020 IMPRESSIONS     1. Hypokinesis of the inferior wall (base/mid) and apex. NO significant  change from echo from AUg 2022.Marland Kitchen Left ventricular ejection fraction, by  estimation, is 50 to 55%. The left ventricle has low normal function.  There is moderate left ventricular  hypertrophy.   2. Right ventricular systolic function is normal. The right ventricular  size is normal. There is normal pulmonary artery systolic pressure.   3. The mitral valve is normal in structure. Mild mitral valve  regurgitation.   4. The aortic valve is normal in structure. Aortic valve regurgitation is  trivial. Mild aortic valve sclerosis is present, with no evidence of  aortic valve stenosis.   5. The inferior vena cava is dilated in size with <50% respiratory  variability, suggesting right atrial pressure of 15 mmHg.  Cardiac catheterization 11/02/2018   Mid LAD to Dist LAD lesion is 90% stenosed with 100% stenosed side branch in Ost 3rd Sept.   Ost LAD to Prox LAD lesion is 100% stenosed.   Mid Cx to Dist Cx lesion is 100% stenosed.   Mid RCA lesion is 100% stenosed.   Ost RCA to Prox RCA lesion is 99% stenosed.   Origin to Prox Graft lesion is 100% stenosed.   Origin to Prox Graft lesion is 100% stenosed.   Ost Cx to Prox Cx lesion is 100% stenosed.   Severe native 3 vessel CAD Total occlusion of the LAD with severe diffuse disease beyond the LIMA insertion Total occlusion of the LCx Total occlusion of the RCA, distal branches collateralized by septal perforator branches of the LAD visualized on the LIMA injections 2.  S/P CABG with known occlusion of the SVG-diag and SVG-RCA, continued patency of the SVG-OM and LIMA-LAD   No targets for PCI. Recommend medical therapy. Consider EP eval for ICD, d/w Primary cardiology service.   Diagnostic Dominance: Right Intervention   Assessment & Plan   1.  NSTEMI/VT  arrest-no further episodes of arm neck back or chest discomfort.  Presented to the emergency department 09/1820 with complaints of chest discomfort and had VT arrest.  Underwent cardiac catheterization 11/01/2020 which showed no good targets for PCI.  He was referred for EP eval but declined ICD placement. Continue amiodarone, aspirin, statin, carvedilol Heart healthy low-sodium diet-salty 6 given Maintain physical activity as tolerated Follow-up with the EP  Essential hypertension-BP today***.  Unable to uptitrate carvedilol during admission due to intermittent episodes of bradycardia. Continue amlodipine, carvedilol Heart healthy low-sodium diet-salty 6 given Increase physical activity as tolerated  Hyperlipidemia-09/03/2020: Cholesterol 167; HDL 38; LDL Cholesterol 120; Triglycerides 43; VLDL 9 Continue atorvastatin, aspirin Heart healthy low-sodium diet-salty 6 given Increase physical activity as tolerated  CKD stage III-creatinine 3.89 down to 1.83.  Baseline creatinine 1.5-1.9.  Continue to avoid nephrotoxic agents. Follows with PCP  Disposition: Follow-up with Dr. Percival Spanish or me in 3-4 months.   Jossie Ng. Jatinder Mcdonagh NP-C    12/03/2020, 1:20 PM [facility] [place] [address] Office [phone] Fax [phone]  Notice: This dictation was prepared with Dragon dictation along with smaller phrase technology. Any transcriptional errors that result from this process are unintentional and may not be corrected upon review.  I spent***minutes examining this patient, reviewing medications, and using patient centered shared decision making involving her cardiac care.   Prior to her visit I spent greater than 20 minutes reviewing her past medical history,  medications, and prior cardiac tests.

## 2020-12-04 ENCOUNTER — Ambulatory Visit: Payer: Medicare Other | Admitting: General Practice

## 2020-12-04 ENCOUNTER — Other Ambulatory Visit: Payer: Self-pay

## 2020-12-06 ENCOUNTER — Other Ambulatory Visit: Payer: Self-pay

## 2020-12-07 ENCOUNTER — Telehealth: Payer: Self-pay | Admitting: *Deleted

## 2020-12-07 ENCOUNTER — Telehealth (INDEPENDENT_AMBULATORY_CARE_PROVIDER_SITE_OTHER): Payer: Self-pay

## 2020-12-07 ENCOUNTER — Other Ambulatory Visit: Payer: Self-pay

## 2020-12-07 NOTE — Telephone Encounter (Signed)
Pt is requesting refills on medications but can not come in office or do telephone appointment, Can patient's medication be refilled.

## 2020-12-07 NOTE — Telephone Encounter (Signed)
Copied from CRM [id number]. Topic: General - Other >> Dec 01, 2020  3:13 PM Traci Sermon wrote: Reason for CRM: Pts spouse called in stating the pharmacy told them the pt would need an appt to come in to be able to get those refilled, pts wife stated pt is unable to come in due to his ability he is unable to get down the stairs and requested if someone can give her a call back to see about getting a telephone appt for the medication refill, please advise.

## 2020-12-07 NOTE — Telephone Encounter (Signed)
Copied from CRM [id number]. Topic: General - Other >> Dec 07, 2020  3:12 PM Jaquita Rector A wrote: Reason for CRM: Patient wife called in to inform Dr Alvis Lemmings that patient is not able to schedule an appointment to be seen so that he can get a refill on his medications amiodarone (PACERONE) 200 MG tablet and carvedilol (COREG) 6.25 MG tablet she states that patient is upstairs and can not come down also want to do a phone visit but they do not have access to a phone with an actual number. They have a call app and this is the number that comes up when they call. She will call back in the morning to see what the answer is from Dr Alvis Lemmings.  [phone]

## 2020-12-08 ENCOUNTER — Telehealth: Payer: Self-pay | Admitting: Cardiology

## 2020-12-08 DIAGNOSIS — I25710 Atherosclerosis of autologous vein coronary artery bypass graft(s) with unstable angina pectoris: Secondary | ICD-10-CM

## 2020-12-08 DIAGNOSIS — I10 Essential (primary) hypertension: Secondary | ICD-10-CM

## 2020-12-08 NOTE — Telephone Encounter (Signed)
*  STAT* If patient is at the pharmacy, call can be transferred to refill team.   1. Which medications need to be refilled? (please list name of each medication and dose if known)  amiodarone (PACERONE) 200 MG tablet carvedilol (COREG) 6.25 MG tablet  2. Which pharmacy/location (including street and city if local pharmacy) is medication to be sent to? [facility]  3. Do they need a 30 day or 90 day supply? 90 day  He is out of medication.

## 2020-12-08 NOTE — Telephone Encounter (Signed)
Call placed to patient and message was left to return phone call.

## 2020-12-08 NOTE — Telephone Encounter (Signed)
This is a cardiology patient and he has an appointment with cardiology on 12/18/2020 for Amiodarone check.  Please advise family to call cardiologist.

## 2020-12-11 ENCOUNTER — Other Ambulatory Visit: Payer: Self-pay | Admitting: *Deleted

## 2020-12-11 NOTE — Telephone Encounter (Signed)
Attempted to contact patient # [phone] to instruct him to call cardiology for his medication refills. His daughter answered and was not with him. She said she would contact her brother and he would instruct his father to return the call to this CM

## 2020-12-11 NOTE — Patient Outreach (Signed)
[facility]) Care Management  12/11/2020  Darrell [name] 12/10/1955 [id number]   Referral Date: 10/10 Referral Source: Hospital liaison Referral Reason: Recent hospital discharge, 3 hospitalization in the last 6 months Insurance: DCE   Outreach attempt #5, unsuccessful, HIPAA compliant voice message left.  No response from member after multiple unsuccessful outreach attempts and letter sent.  Will close case at this time due to inability to maintain contact.  Will notify member and primary MD of case closure.   Plan: RN CM will close case due to inability to establish contact.   [name], [place], MSN [facility] Care Management  [facility] Manager [phone]

## 2020-12-11 NOTE — Telephone Encounter (Signed)
Can you please refer to my message from 12/08/2020 regarding this patient and his need for refills.  This should be directed to cardiology and at the time of response he was scheduled for a follow up with them on 12/18/20 after a cardiac procedure but now I do not see that in his chart.  I have not had a visit with him since 04/2019.  Thanks.

## 2020-12-15 NOTE — Telephone Encounter (Signed)
Attempted again to contact patient to instruct him to call cardiology for his medication refills.   Messages left on # [phone] and [phone]. Call back requested to this CM. Call also placed to # [phone], voicemail full, unable to leave a message

## 2020-12-18 ENCOUNTER — Ambulatory Visit: Payer: Medicare Other | Admitting: Student

## 2020-12-29 ENCOUNTER — Other Ambulatory Visit: Payer: Self-pay

## 2020-12-29 MED ORDER — AMLODIPINE BESYLATE 10 MG PO TABS
10.0000 mg | ORAL_TABLET | Freq: Every day | ORAL | 0 refills | Status: DC
  Filled 2020-12-29: qty 5, 5d supply, fill #0

## 2020-12-29 MED ORDER — AMIODARONE HCL 200 MG PO TABS
200.0000 mg | ORAL_TABLET | Freq: Every day | ORAL | 0 refills | Status: AC
  Filled 2020-12-29: qty 5, 5d supply, fill #0

## 2020-12-29 MED ORDER — CARVEDILOL 6.25 MG PO TABS
6.2500 mg | ORAL_TABLET | Freq: Two times a day (BID) | ORAL | 0 refills | Status: DC
  Filled 2020-12-29: qty 10, 5d supply, fill #0

## 2020-12-29 MED ORDER — ATORVASTATIN CALCIUM 80 MG PO TABS
80.0000 mg | ORAL_TABLET | Freq: Every evening | ORAL | 0 refills | Status: DC
  Filled 2020-12-29: qty 5, 5d supply, fill #0

## 2020-12-29 NOTE — Telephone Encounter (Signed)
Returned call to son chris-(DPR) he states that pt is out of medication and needs a refill pt has not been taking his medication since November. I have discussed refills/appt problems with Dr Daiva Nakayama. He did ok refill. [facility] states that this is the last refill that we will give to this pt. Son verbalized understanding, SON will MAKE SURE PT IS AT THIS APPT. HE WILL RIDE WITH HIM TO THIS APPT. Also, pt has been having chest pain daily and this has been relieved by taking nitro x1 daily. I have set up an emergent transportation to bring him to this appt on Monday 01-01-21. Discuss this with son, informed that [facility] will not provide any more refills. Verbalized understanding.

## 2020-12-29 NOTE — Telephone Encounter (Signed)
Patient's wife called to check on status of refill.  She states he can't come to an appt because he can't climb stairs.  He can't walk but a few feet and he heart starts to hurt him.  He doesn't have a phone right now either.  They are in the process of moving.

## 2020-12-29 NOTE — Telephone Encounter (Signed)
Assistance provided to arrange [facility] [employer] ride to get pt to office. Generally pt will need door to door services however he is in need of urgent appt Monday therefore we will have to schedule standard Benedetto Goad for him. Pt son aware per Marcelino Duster, LPN, and will assist pt with getting into the vehicle and can ride w/ him to appt.   Octavio Graves, MSW, LCSW Clinical Social Worker II [facility] Navigation  [phone]- work cell phone (preferred) [phone]- desk phone

## 2020-12-31 NOTE — Progress Notes (Deleted)
Cardiology Office Note   Date:  12/31/2020   ID:  Darrell [name], DOB 06-05-1955, MRN [id number]  PCP:  Hoy Register, MD  Cardiologist:   [name], MD Referring:  ***  No chief complaint on file.     History of Present Illness: Darrell [name] is a 65 y.o. male who presents for follow up of CAD.  He has a  history with anatomy as below but has been non adherent with follow up or meds.    He was last in the hospital in August with hypertensive urgency and NSTEMI.  He refused repeat cath in the hospital intially.  However he had a VT arrest in the hospital and he did eventually have this and he was managed medically.  He had hypertensive urgency , acute on chronic diastolic HF, and AKI.  He has not been back for follow up after this.   ***  NSTEMI Patient presented with chest pain with elevated troponins with significant history of CAD with prior CABG and PCI. Last left heart cath in 04/2018 with restenosis of 99% of the Cx and severe diffuse disease of the LAD requiring PCI. Troponin trending up 130> [phone] -Neurology input greatly appreciated, patient high-sensitivity troponins peaked at 1687, patient refused multiple offers for cardiac cath, his Lexiscan significant with peri-infarct ischemia in the anterior Epics, patient is adamant about refusing cardiac cath,. -Current recommendation is to optimize medical management, he is on aspirin, Plavix, statin and Coreg.   Hypertensive emergency : Blood pressure on arrival 249/137.   -Since has been adjustment during hospital stay, his hydralazine has been increased, he was kept on amlodipine and Coreg, as well he was kept on his Imdur, blood pressures 127/58 on discharge, losartan/hydrochlorothiazide has been held during hospital stay, and held on discharge as patient has been on the lower side at time of discharge.       AKI on CKD stage IIIa: The baseline creat seemed to be 1.91.  *** Patient presented with serum  creatinine 1.91 w, Baseline serum creatinine 1.4-1.5 Avoid nephrotoxic medications, continue monitor renal functions. Continues 1.8 on discharge, patient losartan/hydrochlorothiazide has been held on discharge as well.   Heart failure with preserved EF: Patient does not appear fluid overloaded on exam. Chest x-ray no cardiomegaly. Last echocardiogram LVEF 55 to 60% grade 1 diastolic dysfunction.   Microcytic anemia: Work-up significant for iron deficiency anemia, will start on iron supplements on discharge, will need iron deficiency anemia work-up as an outpatient   Diabetes mellitus type 2:  Carb modified diet.  Kept on insulin sliding scale during hospital stay.   Hyperlipidemia: Continue atorvastatin 80 mg daily.     Past Medical History:  Diagnosis Date   CKD (chronic kidney disease), stage III (HCC)    Claudication (HCC)    Coronary artery disease    a. 08/2001 s/p DES to RCA/RI;  b. 07/2006 s/p DES to p/d LCX;  c. 11/2007 CABGx5 (LIMA->LAD, VG->D1, LRA->OM1, VG->PDA->LPL); c. 03/2015: Synergy DES to mid-Cx in the setting of NSTEMI; d. 11/2015 NSTEMI->Med Rx. e. NSTEMI 01/2018 -> med rx.   Diabetes mellitus type 2 in nonobese [facility])    a. A1c 7.0 09/2012.   Diastolic dysfunction    a. 02/2016 Echo: EF 50-55%, gr1DD, Ao sclerosis, dil Ao root (78mm), sev dil LA, triv TR, PASP .   Hyperlipidemia    Hypertension    Noncompliance    Right carotid bruit    a. noted 01/2018, will need OP  eval when patient complies with follow-up.    Past Surgical History:  Procedure Laterality Date   CARDIAC CATHETERIZATION N/A 04/06/2015   Procedure: Left Heart Cath and Cors/Grafts Angiography;  Surgeon: Darrell Harp, MD;  Location: [place] [facility];  Service: Cardiovascular;  Laterality: N/A;   CARDIAC CATHETERIZATION N/A 04/06/2015   Procedure: Coronary Stent Intervention;  Surgeon: Darrell Harp, MD;  2.5 mm x 12 mm long Synergy DES followed by  2.5 mm x 16 mm long Synergy DES      CORONARY ARTERY BYPASS GRAFT  2009   2009 LIMA to LAD, SVG to Diag, SVG to PDA and PL, left radial to OM   CORONARY BALLOON ANGIOPLASTY N/A 05/04/2018   Procedure: CORONARY BALLOON ANGIOPLASTY;  Surgeon: Darrell Harp, MD;  Location: [place] [facility];  Service: Cardiovascular;  Laterality: N/A;  Prox CFX   LEFT HEART CATH N/A 10/24/2012   Procedure: LEFT HEART CATH;  Surgeon: Darrell Sine, MD;  Location: [facility];  Service: Cardiovascular;  Laterality: N/A;   LEFT HEART CATH AND CORS/GRAFTS ANGIOGRAPHY N/A 05/04/2018   Procedure: LEFT HEART CATH AND CORS/GRAFTS ANGIOGRAPHY;  Surgeon: Darrell Harp, MD;  Location: [place] [facility];  Service: Cardiovascular;  Laterality: N/A;   LEFT HEART CATH AND CORS/GRAFTS ANGIOGRAPHY N/A 11/01/2020   Procedure: LEFT HEART CATH AND CORS/GRAFTS ANGIOGRAPHY;  Surgeon: Darrell Mocha, MD;  Location: [place] [facility];  Service: Cardiovascular;  Laterality: N/A;     Current Outpatient Medications  Medication Sig Dispense Refill   amiodarone (PACERONE) 200 MG tablet Take 1 tablet (200 mg total) by mouth daily for 5 days. 5 tablet 0   amLODipine (NORVASC) 10 MG tablet Take 1 tablet (10 mg total) by mouth daily. Please make PCP appointment. 5 tablet 0   aspirin 81 MG EC tablet Take 1 tablet (81 mg total) by mouth daily. Swallow whole. 30 tablet 1   atorvastatin (LIPITOR) 80 MG tablet Take 1 tablet (80 mg total) by mouth every evening. 5 tablet 0   carvedilol (COREG) 6.25 MG tablet Take 1 tablet (6.25 mg total) by mouth 2 (two) times daily with a meal. 10 tablet 0   metFORMIN (GLUCOPHAGE) 500 MG tablet Take 1 tablet (500 mg total) by mouth daily with breakfast. 30 tablet 1   nitroGLYCERIN (NITROSTAT) 0.4 MG SL tablet Place 1 tablet (0.4 mg total) under the tongue every 5 (five) minutes as needed for chest pain. 25 tablet 1   No current facility-administered medications for this visit.    Allergies:   Patient has no known allergies.     Social History:  The patient  reports that he has never smoked. He has never used smokeless tobacco. He reports that he does not drink alcohol and does not use drugs.   Family History:  The patient's ***family history includes CAD (age of onset: 33) in his mother; Cancer in his mother; Heart failure in his father and mother; Kidney failure in his brother.    ROS:  Please see the history of present illness.   Otherwise, review of systems are positive for {NONE DEFAULTED:18576}.   All other systems are reviewed and negative.    PHYSICAL EXAM: VS:  There were no vitals taken for this visit. , BMI There is no height or weight on file to calculate BMI. GENERAL:  Well appearing HEENT:  Pupils equal round and reactive, fundi not visualized, oral mucosa unremarkable NECK:  No jugular venous distention, waveform within  normal limits, carotid upstroke brisk and symmetric, no bruits, no thyromegaly LYMPHATICS:  No cervical, inguinal adenopathy LUNGS:  Clear to auscultation bilaterally BACK:  No CVA tenderness CHEST:  Unremarkable HEART:  PMI not displaced or sustained,S1 and S2 within normal limits, no S3, no S4, no clicks, no rubs, *** murmurs ABD:  Flat, positive bowel sounds normal in frequency in pitch, no bruits, no rebound, no guarding, no midline pulsatile mass, no hepatomegaly, no splenomegaly EXT:  2 plus pulses throughout, no edema, no cyanosis no clubbing SKIN:  No rashes no nodules NEURO:  Cranial nerves II through XII grossly intact, motor grossly intact throughout PSYCH:  Cognitively intact, oriented to person place and time  CARDIAC CATH:  10/2020  Diagnostic Dominance: Right    EKG:  EKG {ACTION; IS/IS [id number] ordered today. The ekg ordered today demonstrates ***   Recent Labs: 05/29/2020: TSH 1.151 09/03/2020: B Natriuretic Peptide 1,578.3 09/04/2020: ALT 17 10/28/2020: Magnesium 2.0 11/01/2020: Hemoglobin 8.7; Platelets 301 11/02/2020: BUN 21; Creatinine, Ser 1.83;  Potassium 4.4; Sodium 136    Lipid Panel    Component Value Date/Time   CHOL 167 09/03/2020 0529   TRIG 43 09/03/2020 0529   HDL 38 (L) 09/03/2020 0529   CHOLHDL 4.4 09/03/2020 0529   VLDL 9 09/03/2020 0529   LDLCALC 120 (H) 09/03/2020 0529      Wt Readings from Last 3 Encounters:  11/02/20 219 lb 5.7 oz (99.5 kg)  09/06/20 230 lb 4.8 oz (104.5 kg)  05/29/20 227 lb 4.7 oz (103.1 kg)      Other studies Reviewed: Additional studies/ records that were reviewed today include: ***. Review of the above records demonstrates:  Please see elsewhere in the note.  ***   ASSESSMENT AND PLAN:  CAD with NSTEMI :  He had disease and was managed as above. ***- troponin ruled in with trend 0.91->1.54->2.00. He was placed on IV heparin and antihypertensive regimen was resumed/titrated. Cardiac cath was offered to the patient but he declined. Plavix was started for medical therapy. Cardiac rehab team is not available on Sunday for phase I cardiac rehab but can be revisited as OP - may not be able to afford this. 2D echo 02/06/18 showed EF 55-60%, hypokinesis of the entireinferior myocardium; consistent with ischemia or infarction in the distribution of the right coronary artery, grade 2 DD, moderate LAE, mildly dilated RA - study was technically difficult.   CKD stage III :  *** - creatinine trend 1.47->1.36->1.28. [name] was resumed. He will be at risk for worsening nephropathy long term if he remains noncompliant with hypertension therapy. Have advised he come to our office in 1 week for BMET given [name] re-initiation/titration.   Hypertensive urgency: ***  - BP improved to 155/85. We will titrate losartan to 100mg  today. Close f/u encouraged.   Hyperlipidemia:  ***  - atorvastatin resumed. If the patient is tolerating statin at time of follow-up appointment, would consider rechecking liver function/lipid panel in 6-8 weeks.   Noncompliance:  *** - asked care manager to see prior to dc. Importance of  compliance reinforced.   DM :  *** - A1c 5.2.  VT arrest:   He did not want an ICD.  ***  He has been treated with amiodarone.     Right carotid bruit:  ***   - can arrange follow-up duplex as outpatient when patient comes for OV.   Mild anemia :  *** - Hgb trend noted 13.3->12.7->11.6. No bleeding reported. Will check CBC when he  returns for BMET in 1 week. If he does not follow up this will be something that needs attention by primary care.    Current medicines are reviewed at length with the patient today.  The patient {ACTIONS; HAS/DOES NOT HAVE:19233} concerns regarding medicines.  The following changes have been made:  {PLAN; NO CHANGE:13088:s}  Labs/ tests ordered today include: *** No orders of the defined types were placed in this encounter.    Disposition:   FU with ***    Signed, [name], MD  12/31/2020 9:30 AM    [facility]

## 2021-01-01 ENCOUNTER — Other Ambulatory Visit: Payer: Self-pay

## 2021-01-01 ENCOUNTER — Ambulatory Visit: Payer: Medicare Other | Admitting: Cardiology

## 2021-01-01 NOTE — Telephone Encounter (Addendum)
Noted that pt did not arrive for appt today 12/5.  Appears that pt/pt son did not confirm the ride that had been scheduled w/ them. They had been provided w/ the number to call Transportation Services if the ride did not show/there was any issues. Pt/pt family had been instructed on how to utilize transportation services. If pt is rescheduled can call [employer] and arrange another ride as needed.   Octavio Graves, MSW, LCSW Clinical Social Worker II [facility] Navigation  [phone]- work cell phone (preferred) [phone]- desk phone

## 2021-01-02 ENCOUNTER — Other Ambulatory Visit: Payer: Self-pay

## 2021-01-03 ENCOUNTER — Other Ambulatory Visit: Payer: Self-pay

## 2021-01-23 ENCOUNTER — Ambulatory Visit: Payer: Self-pay

## 2021-01-23 ENCOUNTER — Other Ambulatory Visit: Payer: Self-pay | Admitting: Cardiology

## 2021-01-23 ENCOUNTER — Other Ambulatory Visit: Payer: Self-pay

## 2021-01-23 ENCOUNTER — Other Ambulatory Visit: Payer: Self-pay | Admitting: Internal Medicine

## 2021-01-23 DIAGNOSIS — I25710 Atherosclerosis of autologous vein coronary artery bypass graft(s) with unstable angina pectoris: Secondary | ICD-10-CM

## 2021-01-23 DIAGNOSIS — I10 Essential (primary) hypertension: Secondary | ICD-10-CM

## 2021-01-23 DIAGNOSIS — E1159 Type 2 diabetes mellitus with other circulatory complications: Secondary | ICD-10-CM

## 2021-01-23 MED ORDER — AMLODIPINE BESYLATE 10 MG PO TABS
10.0000 mg | ORAL_TABLET | Freq: Every day | ORAL | 0 refills | Status: DC
  Filled 2021-01-23 – 2021-02-15 (×2): qty 30, 30d supply, fill #0

## 2021-01-23 MED ORDER — ATORVASTATIN CALCIUM 80 MG PO TABS
80.0000 mg | ORAL_TABLET | Freq: Every evening | ORAL | 0 refills | Status: DC
  Filled 2021-01-23 – 2021-02-15 (×2): qty 30, 30d supply, fill #0

## 2021-01-23 NOTE — Telephone Encounter (Signed)
°*  STAT* If patient is at the pharmacy, call can be transferred to refill team.   1. Which medications need to be refilled? (please list name of each medication and dose if known) Amiodarone and Carvedilol  2. Which pharmacy/location (including street and city if local pharmacy) is medication to be sent to? [facility]  3. Do they need a 30 day or 90 day supply? [place] until his appointment on 02-07-21

## 2021-01-23 NOTE — Telephone Encounter (Signed)
Will forward to provider  

## 2021-01-23 NOTE — Telephone Encounter (Signed)
Patient has never been seen in the [facility].  Has a PCP with a visit in March 2023.

## 2021-01-23 NOTE — Telephone Encounter (Addendum)
°  Chief Complaint: Out of medications Symptoms:  Frequency:  Pertinent Negatives: Patient denies  Disposition: [] ED /[] Urgent Care (no appt availability in office) / [] Appointment(In office/virtual)/ []  [facility]/ [] Home Care/ [] Refused Recommended Disposition  Additional Notes: Pt states that he is out of his 4 medications.  Wife states that they had no phone which made contacting them difficult. Their son gave them phones as presents, and they are moving much closer to son this week.   Pt has set up appointments with Dr. for March and the Cardiologist for January.  Son will insure that pt gets to the appointments.   There was incorrect information added here, which has been deleted.

## 2021-01-24 ENCOUNTER — Other Ambulatory Visit: Payer: Self-pay

## 2021-01-24 ENCOUNTER — Other Ambulatory Visit: Payer: Self-pay | Admitting: Family Medicine

## 2021-01-24 ENCOUNTER — Other Ambulatory Visit: Payer: Self-pay | Admitting: Cardiology

## 2021-01-24 DIAGNOSIS — E1159 Type 2 diabetes mellitus with other circulatory complications: Secondary | ICD-10-CM

## 2021-01-24 DIAGNOSIS — I10 Essential (primary) hypertension: Secondary | ICD-10-CM

## 2021-01-24 MED ORDER — CARVEDILOL 6.25 MG PO TABS
6.2500 mg | ORAL_TABLET | Freq: Two times a day (BID) | ORAL | 0 refills | Status: DC
  Filled 2021-01-24 – 2021-02-15 (×2): qty 28, 14d supply, fill #0

## 2021-01-24 MED ORDER — NITROGLYCERIN 0.4 MG SL SUBL
SUBLINGUAL_TABLET | SUBLINGUAL | 2 refills | Status: AC
  Filled 2021-01-24 – 2021-02-15 (×2): qty 25, 25d supply, fill #0
  Filled 2021-04-13: qty 25, 25d supply, fill #1

## 2021-01-24 NOTE — Telephone Encounter (Signed)
Patient has an appt.  Wanted to know if Rx were ready to pick up.  Transferred call to pharmacy.

## 2021-01-24 NOTE — Telephone Encounter (Signed)
Patient is due for follow up.

## 2021-01-25 ENCOUNTER — Other Ambulatory Visit: Payer: Self-pay

## 2021-01-25 ENCOUNTER — Telehealth: Payer: Self-pay | Admitting: Cardiology

## 2021-01-25 DIAGNOSIS — I10 Essential (primary) hypertension: Secondary | ICD-10-CM

## 2021-01-25 NOTE — Telephone Encounter (Signed)
°*  STAT* If patient is at the pharmacy, call can be transferred to refill team.   1. Which medications need to be refilled? (please list name of each medication and dose if known) amiodarone (PACERONE) 200 MG tablet (Expired)  2. Which pharmacy/location (including street and city if local pharmacy) is medication to be sent to? [facility]  3. Do they need a 30 day or 90 day supply? 90

## 2021-01-25 NOTE — Telephone Encounter (Signed)
Concerns with Metformin. Pls advise. Patient due for f/u  for medication refill Metformin. Last A1C was a hospital encounter in 08/2020, 5.6. Medication prescribed by another provider (resident).  Last OV was  09/10/2019, Televisit, therefore metformin was denied.   Per wife unable to come in office for sooner apt. Patient is moving and limited mobility and heart precautions.    Please advise.

## 2021-01-25 NOTE — Telephone Encounter (Signed)
Pts wife called to see why the Refill for Metformin was denied / pt schedule a follow up with Dr. Sherian Rein for next opening in March/ please advise asap

## 2021-01-26 NOTE — Telephone Encounter (Signed)
Pt's wife called to report that he is completely out of his current supply, pt is scheduled for next available.  [facility] [address] ([address] [place] - [address]  [address] [place] ([place]) [place] [address]  Phone: [phone] Fax: [phone]  Pt's wife wants Metformin sent there

## 2021-01-26 NOTE — Telephone Encounter (Signed)
Attempted to call patient's home number [phone] but no answer and mailbox was full. Attempted to call patient's mobile number and the mobile number of the patient's wife but no answer. Unable to leave a message. If the call is returned please inform the patient of the message below from Dr. Alvis Lemmings regarding refill of Metformin.

## 2021-01-26 NOTE — Telephone Encounter (Signed)
He does have abnormal kidney function and GFR of 41 (stage 3b chronic kidney disease) close to contraindication for metformin use hence it will be risk to refill metformin without recent kidney function.  With an A1c of 5.6 he can hold off on metformin and remain on diet control till his in person visit.

## 2021-01-29 ENCOUNTER — Other Ambulatory Visit: Payer: Self-pay

## 2021-01-30 ENCOUNTER — Telehealth: Payer: Self-pay | Admitting: Family Medicine

## 2021-01-30 ENCOUNTER — Other Ambulatory Visit: Payer: Self-pay

## 2021-01-30 NOTE — Telephone Encounter (Signed)
Pt has not been seen in a while can patient have refill on Metformin.

## 2021-01-30 NOTE — Telephone Encounter (Signed)
Copied from [place] [phone]. Topic: General - Other >> Jan 30, 2021  3:52 PM Tessa Lerner A wrote: Reason for CRM: The patient's wife has been in contact with the pharmacy and told that the refill request for Rx #: [id number]  metFORMIN (GLUCOPHAGE) 500 MG tablet [id number] ENDED  Has been denied  The patient's wife would like to discuss this denial further with a member of staff  Please contact further when available

## 2021-01-31 NOTE — Telephone Encounter (Signed)
Spoke to patient's wife.She stated husband has not been taking Amiodarone in over a month.Advised to keep appointment already scheduled with Dr.Hochrein 02/07/21 at 11:40 am.Arrive 15 mins early.Bring all medication to appointment.

## 2021-01-31 NOTE — Telephone Encounter (Signed)
Please refer to my message from 01/24/2022 and informed him accordingly.  This is a duplicate message.

## 2021-01-31 NOTE — Telephone Encounter (Signed)
Patient's wife is calling wanting to know why this prescription was denied. Patient is currently scheduled for an upcoming appointment. Please advise.

## 2021-01-31 NOTE — Telephone Encounter (Signed)
Pt's wife called in, she was given message from Dr Margarita Rana on 01/24/21. Explained CKD to her and advised more information can be given during appt on 04/02/21.

## 2021-02-05 NOTE — Progress Notes (Deleted)
Cardiology Office Note   Date:  02/05/2021   ID:  Darrell [name], DOB Jun 29, 1955, MRN [id number]  PCP:  Darrell Rakes, MD  Cardiologist:   Darrell Breeding, MD Referring:  ***  No chief complaint on file.     History of Present Illness: Darrell [name] is a 66 y.o. male who presents for follow up of CAD.    He underwent CABG in 2009 with LIMA to LAD, left radial to OM, SVG-PDA-PLA, SVG to diagonal.  Underwent subsequent PCI since that time.  Last cardiac catheterization 04/2018 severe in-stent restenosis of 99% in native circumflex with diffuse severe disease in the LAD beyond LIMA insertion that was treated with Cutting Balloon atherectomy.  He was maintained on DAPT with aspirin/Plavix.  He has had multiple admissions for recurrent chest pain.  Admission 05/2020 with elevated troponin felt to be in the setting of accelerated hypertension. Admission 09/04/2020 with NSTEMI.  High-sensitivity troponin trend 340>> 1371>> 1687, with initial recommendation being for cardiac cath which patient refused.  He did agree to stress testing which was noted to be high risk. It was again recommended to the patient that he undergo cardiac catheterization which he adamantly refused.     He presented to the ED on 9/18 with recurrent chest pain and elevated enzymes.  ***   *** Labs in the ED showed sodium 135, potassium 3.8, creatinine 2.2, magnesium 2, high-sensitivity troponin 685>> 902, WBC 8.6, hemoglobin 8.4, lactic acid 1.4.  COVID-negative.  VBG: Bicarb 17 , pCO2 29, pH 7.3.  EKG on admission showed sinus tachycardia, 114 bpm, nonspecific IVCD, old anterior septal infarct with slight ST depression in lateral leads.  Chest x-ray negative.  He was started on IV heparin, cardiology consulted in regards to chest pain/non-STEMI.  Admitted to teaching service for further management.   He was therefore treated medically and strongly encouraged to remain compliant with medications and keep office follow-up.   He was continued on aspirin, Plavix, statin, Norvasc and Coreg.  Follow-up in the office was arranged but he was a no-show.    He currently lives in an apartment and reportedly never leaves the house.  He is very limited in his mobility.  He does have a son which attempts to help with medical appointments and medication pickups.  He has been evaluated by social work during prior admissions and set up with transportation with [facility], as well as attempts to set up with mail order delivery with medications.  Appears that all efforts have been unsuccessful in this area so far.   Presented to the ED on 9/18 with recurrent chest pain.  Indicates chest pain started about 3 hours prior to EMS arrival early that morning.  He was given sublingual nitro in route with relief of symptoms.  He is also noted to be infested with bedbugs which has been a recurrent issue over previous admissions.   Labs in the ED showed sodium 135, potassium 3.8, creatinine 2.2, magnesium 2, high-sensitivity troponin 685>> 902, WBC 8.6, hemoglobin 8.4, lactic acid 1.4.  COVID-negative.  VBG: Bicarb 17 , pCO2 29, pH 7.3.  EKG on admission showed sinus tachycardia, 114 bpm, nonspecific IVCD, old anterior septal infarct with slight ST depression in lateral leads.  Chest x-ray negative.  He was started on IV heparin, cardiology consulted in regards to chest pain/non-STEMI.  Admitted to teaching service for further management.  Past Medical History:  Diagnosis Date   CKD (chronic kidney disease), stage III ([place])  Claudication [facility])    Coronary artery disease    a. 08/2001 s/p DES to RCA/RI;  b. 07/2006 s/p DES to p/d LCX;  c. 11/2007 CABGx5 (LIMA->LAD, VG->D1, LRA->OM1, VG->PDA->LPL); c. 03/2015: Synergy DES to mid-Cx in the setting of NSTEMI; d. 11/2015 NSTEMI->Med Rx. e. NSTEMI 01/2018 -> med rx.   Diabetes mellitus type 2 in nonobese [facility])    a. A1c 7.0 09/2012.   Diastolic dysfunction    a. 02/2016 Echo: EF 50-55%, gr1DD, Ao sclerosis,  dil Ao root (94mm), sev dil LA, triv TR, PASP 50mmHg.   Hyperlipidemia    Hypertension    Noncompliance    Right carotid bruit    a. noted 01/2018, will need OP eval when patient complies with follow-up.    Past Surgical History:  Procedure Laterality Date   CARDIAC CATHETERIZATION N/A 04/06/2015   Procedure: Left Heart Cath and Cors/Grafts Angiography;  Surgeon: Lorretta Harp, MD;  Location: [place] CV LAB;  Service: Cardiovascular;  Laterality: N/A;   CARDIAC CATHETERIZATION N/A 04/06/2015   Procedure: Coronary Stent Intervention;  Surgeon: Lorretta Harp, MD;  2.5 mm x 12 mm long Synergy DES followed by  2.5 mm x 16 mm long Synergy DES     CORONARY ARTERY BYPASS GRAFT  2009   2009 LIMA to LAD, SVG to Diag, SVG to PDA and PL, left radial to OM   CORONARY BALLOON ANGIOPLASTY N/A 05/04/2018   Procedure: CORONARY BALLOON ANGIOPLASTY;  Surgeon: Lorretta Harp, MD;  Location: [place] CV LAB;  Service: Cardiovascular;  Laterality: N/A;  Prox CFX   LEFT HEART CATH N/A 10/24/2012   Procedure: LEFT HEART CATH;  Surgeon: Troy Sine, MD;  Location: [facility] CATH LAB;  Service: Cardiovascular;  Laterality: N/A;   LEFT HEART CATH AND CORS/GRAFTS ANGIOGRAPHY N/A 05/04/2018   Procedure: LEFT HEART CATH AND CORS/GRAFTS ANGIOGRAPHY;  Surgeon: Lorretta Harp, MD;  Location: [place] CV LAB;  Service: Cardiovascular;  Laterality: N/A;   LEFT HEART CATH AND CORS/GRAFTS ANGIOGRAPHY N/A 11/01/2020   Procedure: LEFT HEART CATH AND CORS/GRAFTS ANGIOGRAPHY;  Surgeon: Sherren Mocha, MD;  Location: [place] CV LAB;  Service: Cardiovascular;  Laterality: N/A;     Current Outpatient Medications  Medication Sig Dispense Refill   amiodarone (PACERONE) 200 MG tablet Take 1 tablet (200 mg total) by mouth daily for 5 days. 5 tablet 0   amLODipine (NORVASC) 10 MG tablet Take 1 tablet (10 mg total) by mouth daily. Please make PCP appointment. 30 tablet 0   aspirin 81 MG EC tablet Take 1 tablet  (81 mg total) by mouth daily. Swallow whole. 30 tablet 1   atorvastatin (LIPITOR) 80 MG tablet Take 1 tablet (80 mg total) by mouth every evening. 30 tablet 0   carvedilol (COREG) 6.25 MG tablet Take 1 tablet (6.25 mg total) by mouth 2 (two) times daily with a meal. 28 tablet 0   metFORMIN (GLUCOPHAGE) 500 MG tablet Take 1 tablet (500 mg total) by mouth daily with breakfast. 30 tablet 1   nitroGLYCERIN (NITROSTAT) 0.4 MG SL tablet Place 1 tablet (0.4 mg total) under the tongue every 5 (five) minutes as needed for chest pain. 25 tablet 1   nitroGLYCERIN (NITROSTAT) 0.4 MG SL tablet PLACE 1 TABLET UNDER THE TONGUE EVERY 5 MINUTES AS NEEDED FOR CHEST PAIN (UP TO 3 DOSES. IF TAKING 3RD DOSE CALL 911) 25 tablet 2   No current facility-administered medications for this visit.    Allergies:   Patient  has no known allergies.    ROS:  Please see the history of present illness.   Otherwise, review of systems are positive for {NONE DEFAULTED:18576}.   All other systems are reviewed and negative.    PHYSICAL EXAM: VS:  There were no vitals taken for this visit. , BMI There is no height or weight on file to calculate BMI. GEN:  No distress NECK:  No jugular venous distention at 90 degrees, waveform within normal limits, carotid upstroke brisk and symmetric, no bruits, no thyromegaly LYMPHATICS:  No cervical adenopathy LUNGS:  Clear to auscultation bilaterally BACK:  No CVA tenderness CHEST:  *** HEART:  S1 and S2 within normal limits, no S3, no S4, no clicks, no rubs, *** murmurs ABD:  Positive bowel sounds normal in frequency in pitch, no bruits, no rebound, no guarding, unable to assess midline mass or bruit with the patient seated. EXT:  2 plus pulses throughout, moderate edema, no cyanosis no clubbing SKIN:  No rashes no nodules NEURO:  Cranial nerves II through XII grossly intact, motor grossly intact throughout PSYCH:  Cognitively intact, oriented to person place and time    GENERAL:  Well  appearing HEENT:  Pupils equal round and reactive, fundi not visualized, oral mucosa unremarkable NECK:  No jugular venous distention, waveform within normal limits, carotid upstroke brisk and symmetric, no bruits, no thyromegaly LYMPHATICS:  No cervical, inguinal adenopathy LUNGS:  Clear to auscultation bilaterally BACK:  No CVA tenderness CHEST:  Unremarkable HEART:  PMI not displaced or sustained,S1 and S2 within normal limits, no S3, no S4, no clicks, no rubs, *** murmurs ABD:  Flat, positive bowel sounds normal in frequency in pitch, no bruits, no rebound, no guarding, no midline pulsatile mass, no hepatomegaly, no splenomegaly EXT:  2 plus pulses throughout, no edema, no cyanosis no clubbing SKIN:  No rashes no nodules NEURO:  Cranial nerves II through XII grossly intact, motor grossly intact throughout PSYCH:  Cognitively intact, oriented to person place and time    EKG:  EKG {ACTION; IS/IS [id number] ordered today. The ekg ordered today demonstrates ***  CARDIAC CATH: 05/04/2018 Mid RCA lesion is 100% stenosed. Ost RCA to Prox RCA lesion is 99% stenosed. Ost LAD to Prox LAD lesion is 100% stenosed. Mid Cx to Dist Cx lesion is 100% stenosed. Origin to Prox Graft lesion is 100% stenosed. Mid LAD to Dist LAD lesion is 90% stenosed with 100% stenosed side branch in Ost 3rd Sept. Origin to Prox Graft lesion is 100% stenosed. Ost Cx to Prox Cx lesion is 99% stenosed. Scoring balloon angioplasty was performed using a BALLOON WOLVERINE 2.75X10. Post intervention, there is a 30% residual stenosis. The left ventricular ejection fraction is 50-55% by visual estimate. LV end diastolic pressure is normal.  IMPRESSION: Mr. Mcgillivary had anatomy fairly similar to what it was in 2017.  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  The mid to distal AV groove circumflex stent was occluded at this point whereas 3 years ago it  was subtotally occluded.  His distal LAD beyond LIMA insertion was diffusely and severely diseased as well.     He had a cutting balloon atherectomy reducing a 99% lesion to less than 30%.     CATH 2020     Recent Labs: 05/29/2020: TSH 1.151 09/03/2020: B Natriuretic Peptide 1,578.3 09/04/2020: ALT 17 10/28/2020: Magnesium 2.0 11/01/2020: Hemoglobin 8.7; Platelets 301 11/02/2020: BUN 21; Creatinine, Ser 1.83; Potassium 4.4; Sodium  136    Lipid Panel    Component Value Date/Time   CHOL 167 09/03/2020 0529   TRIG 43 09/03/2020 0529   HDL 38 (L) 09/03/2020 0529   CHOLHDL 4.4 09/03/2020 0529   VLDL 9 09/03/2020 0529   LDLCALC 120 (H) 09/03/2020 0529      Wt Readings from Last 3 Encounters:  11/02/20 219 lb 5.7 oz (99.5 kg)  09/06/20 230 lb 4.8 oz (104.5 kg)  05/29/20 227 lb 4.7 oz (103.1 kg)      Other studies Reviewed: Additional studies/ records that were reviewed today include: ***. Review of the above records demonstrates:  Please see elsewhere in the note.  ***   ASSESSMENT AND PLAN:  NSTEMI/VT arrest in patient with history of CAD s/p CABG in 2009 with subsequent PCIs:  ***  patient presented with VT arrest. He was started on IV amiodarone without recurrence though does continue to have intermittent PVCs. Amiodarone rate reduced 10/24/20 due to bradycardia concerns. AKI limited ischemic evaluation earlier in admission.  Creatinine improved to baseline and underwent left heart catheterization on 10/5.  Cath today showed severe native 3 vessel CAD, occluded SVG-diag/SVG-RCA, patent SVG-OM and LIMA-LAD, no targets for PCI.  -EP consulted for ICD.  Patient declines ICD.  Recommend continuing amiodarone.  Would continue 200 mg BID x 2 weeks then decrease to 200 mg daily - Continue aspirin and statin - Continue carvedilol   AoCKD stage 3b:  ***  Cr peaked at 3.89, down to 1.83 today. Baseline Cr 1.5-1.9 - Continue to avoid nephrotoxic agents - Continue close monitoring    HTN:    ***  BP generally above goal. Has had some intermittent bradycardia on telemetry so will avoid further titration of carvedilol.  - Continue amlodipine, dose increased to 10mg  daily - Continue carvedilol   HLD:   ***  LDL 120 09/03/20 - Continue atorvastatin  VT:   He has a history of VT arrest.  ***   Current medicines are reviewed at length with the patient today.  The patient {ACTIONS; HAS/DOES NOT HAVE:19233} concerns regarding medicines.  The following changes have been made:  {PLAN; NO CHANGE:13088:s}  Labs/ tests ordered today include: *** No orders of the defined types were placed in this encounter.    Disposition:   FU with ***    Signed, Darrell Breeding, MD  02/05/2021 9:25 PM    [facility]

## 2021-02-07 ENCOUNTER — Ambulatory Visit: Payer: Medicare Other | Admitting: Cardiology

## 2021-02-07 DIAGNOSIS — I10 Essential (primary) hypertension: Secondary | ICD-10-CM

## 2021-02-07 DIAGNOSIS — I25118 Atherosclerotic heart disease of native coronary artery with other forms of angina pectoris: Secondary | ICD-10-CM

## 2021-02-15 ENCOUNTER — Other Ambulatory Visit: Payer: Self-pay

## 2021-02-19 ENCOUNTER — Other Ambulatory Visit: Payer: Self-pay

## 2021-04-02 ENCOUNTER — Ambulatory Visit: Payer: Medicare Other | Admitting: Family Medicine

## 2021-04-13 ENCOUNTER — Other Ambulatory Visit: Payer: Self-pay | Admitting: Cardiology

## 2021-04-13 ENCOUNTER — Other Ambulatory Visit: Payer: Self-pay

## 2021-04-13 DIAGNOSIS — I10 Essential (primary) hypertension: Secondary | ICD-10-CM

## 2021-04-13 DIAGNOSIS — I25710 Atherosclerosis of autologous vein coronary artery bypass graft(s) with unstable angina pectoris: Secondary | ICD-10-CM

## 2021-04-16 ENCOUNTER — Other Ambulatory Visit: Payer: Self-pay

## 2021-04-16 MED ORDER — CARVEDILOL 6.25 MG PO TABS
6.2500 mg | ORAL_TABLET | Freq: Two times a day (BID) | ORAL | 0 refills | Status: AC
Start: 2021-04-16 — End: ?
  Filled 2021-04-16: qty 28, 14d supply, fill #0

## 2021-04-16 MED ORDER — AMLODIPINE BESYLATE 10 MG PO TABS
10.0000 mg | ORAL_TABLET | Freq: Every day | ORAL | 0 refills | Status: AC
  Filled 2021-04-16: qty 30, 30d supply, fill #0

## 2021-04-16 MED ORDER — ATORVASTATIN CALCIUM 80 MG PO TABS
80.0000 mg | ORAL_TABLET | Freq: Every evening | ORAL | 0 refills | Status: AC
  Filled 2021-04-16: qty 30, 30d supply, fill #0

## 2021-04-17 ENCOUNTER — Other Ambulatory Visit: Payer: Self-pay

## 2021-04-19 ENCOUNTER — Other Ambulatory Visit: Payer: Self-pay

## 2021-05-27 ENCOUNTER — Emergency Department (HOSPITAL_COMMUNITY): Payer: Medicare Other

## 2021-05-27 ENCOUNTER — Encounter (HOSPITAL_COMMUNITY): Payer: Self-pay | Admitting: Emergency Medicine

## 2021-05-27 ENCOUNTER — Observation Stay (HOSPITAL_BASED_OUTPATIENT_CLINIC_OR_DEPARTMENT_OTHER): Payer: Medicare Other

## 2021-05-27 ENCOUNTER — Encounter (HOSPITAL_COMMUNITY): Payer: Medicare Other

## 2021-05-27 DIAGNOSIS — N179 Acute kidney failure, unspecified: Secondary | ICD-10-CM | POA: Diagnosis present

## 2021-05-27 DIAGNOSIS — R54 Age-related physical debility: Secondary | ICD-10-CM | POA: Diagnosis present

## 2021-05-27 DIAGNOSIS — I2510 Atherosclerotic heart disease of native coronary artery without angina pectoris: Secondary | ICD-10-CM | POA: Diagnosis present

## 2021-05-27 DIAGNOSIS — J690 Pneumonitis due to inhalation of food and vomit: Secondary | ICD-10-CM | POA: Diagnosis not present

## 2021-05-27 DIAGNOSIS — I25110 Atherosclerotic heart disease of native coronary artery with unstable angina pectoris: Secondary | ICD-10-CM | POA: Diagnosis present

## 2021-05-27 DIAGNOSIS — I10 Essential (primary) hypertension: Secondary | ICD-10-CM | POA: Diagnosis present

## 2021-05-27 DIAGNOSIS — N1832 Chronic kidney disease, stage 3b: Secondary | ICD-10-CM | POA: Diagnosis present

## 2021-05-27 DIAGNOSIS — Z20822 Contact with and (suspected) exposure to covid-19: Secondary | ICD-10-CM | POA: Diagnosis present

## 2021-05-27 DIAGNOSIS — I249 Acute ischemic heart disease, unspecified: Principal | ICD-10-CM

## 2021-05-27 DIAGNOSIS — I420 Dilated cardiomyopathy: Secondary | ICD-10-CM | POA: Diagnosis not present

## 2021-05-27 DIAGNOSIS — I447 Left bundle-branch block, unspecified: Secondary | ICD-10-CM | POA: Diagnosis present

## 2021-05-27 DIAGNOSIS — I129 Hypertensive chronic kidney disease with stage 1 through stage 4 chronic kidney disease, or unspecified chronic kidney disease: Secondary | ICD-10-CM | POA: Diagnosis not present

## 2021-05-27 DIAGNOSIS — Z66 Do not resuscitate: Secondary | ICD-10-CM | POA: Diagnosis not present

## 2021-05-27 DIAGNOSIS — Z951 Presence of aortocoronary bypass graft: Secondary | ICD-10-CM | POA: Diagnosis not present

## 2021-05-27 DIAGNOSIS — I5022 Chronic systolic (congestive) heart failure: Secondary | ICD-10-CM | POA: Diagnosis not present

## 2021-05-27 DIAGNOSIS — I472 Ventricular tachycardia, unspecified: Secondary | ICD-10-CM | POA: Diagnosis present

## 2021-05-27 DIAGNOSIS — Z515 Encounter for palliative care: Secondary | ICD-10-CM

## 2021-05-27 DIAGNOSIS — I700 Atherosclerosis of aorta: Secondary | ICD-10-CM | POA: Diagnosis present

## 2021-05-27 DIAGNOSIS — I130 Hypertensive heart and chronic kidney disease with heart failure and stage 1 through stage 4 chronic kidney disease, or unspecified chronic kidney disease: Secondary | ICD-10-CM | POA: Diagnosis not present

## 2021-05-27 DIAGNOSIS — R579 Shock, unspecified: Secondary | ICD-10-CM | POA: Diagnosis not present

## 2021-05-27 DIAGNOSIS — I4729 Other ventricular tachycardia: Secondary | ICD-10-CM | POA: Diagnosis not present

## 2021-05-27 DIAGNOSIS — Z8249 Family history of ischemic heart disease and other diseases of the circulatory system: Secondary | ICD-10-CM

## 2021-05-27 DIAGNOSIS — I4901 Ventricular fibrillation: Secondary | ICD-10-CM | POA: Diagnosis not present

## 2021-05-27 DIAGNOSIS — E876 Hypokalemia: Secondary | ICD-10-CM | POA: Diagnosis not present

## 2021-05-27 DIAGNOSIS — I255 Ischemic cardiomyopathy: Secondary | ICD-10-CM | POA: Diagnosis not present

## 2021-05-27 DIAGNOSIS — I25710 Atherosclerosis of autologous vein coronary artery bypass graft(s) with unstable angina pectoris: Secondary | ICD-10-CM | POA: Diagnosis present

## 2021-05-27 DIAGNOSIS — I213 ST elevation (STEMI) myocardial infarction of unspecified site: Secondary | ICD-10-CM | POA: Diagnosis not present

## 2021-05-27 DIAGNOSIS — I4721 Torsades de pointes: Secondary | ICD-10-CM | POA: Diagnosis not present

## 2021-05-27 DIAGNOSIS — N183 Chronic kidney disease, stage 3 unspecified: Secondary | ICD-10-CM | POA: Diagnosis not present

## 2021-05-27 DIAGNOSIS — E1122 Type 2 diabetes mellitus with diabetic chronic kidney disease: Secondary | ICD-10-CM | POA: Diagnosis present

## 2021-05-27 DIAGNOSIS — D649 Anemia, unspecified: Secondary | ICD-10-CM | POA: Diagnosis not present

## 2021-05-27 DIAGNOSIS — I161 Hypertensive emergency: Secondary | ICD-10-CM | POA: Diagnosis not present

## 2021-05-27 DIAGNOSIS — I252 Old myocardial infarction: Secondary | ICD-10-CM | POA: Diagnosis not present

## 2021-05-27 DIAGNOSIS — R0989 Other specified symptoms and signs involving the circulatory and respiratory systems: Secondary | ICD-10-CM | POA: Diagnosis present

## 2021-05-27 DIAGNOSIS — R0789 Other chest pain: Secondary | ICD-10-CM | POA: Diagnosis not present

## 2021-05-27 DIAGNOSIS — Z91199 Patient's noncompliance with other medical treatment and regimen due to unspecified reason: Secondary | ICD-10-CM | POA: Diagnosis not present

## 2021-05-27 DIAGNOSIS — E1151 Type 2 diabetes mellitus with diabetic peripheral angiopathy without gangrene: Secondary | ICD-10-CM | POA: Diagnosis present

## 2021-05-27 DIAGNOSIS — Z9911 Dependence on respirator [ventilator] status: Secondary | ICD-10-CM | POA: Diagnosis not present

## 2021-05-27 DIAGNOSIS — I214 Non-ST elevation (NSTEMI) myocardial infarction: Principal | ICD-10-CM | POA: Diagnosis present

## 2021-05-27 DIAGNOSIS — I462 Cardiac arrest due to underlying cardiac condition: Secondary | ICD-10-CM | POA: Diagnosis not present

## 2021-05-27 DIAGNOSIS — Z86718 Personal history of other venous thrombosis and embolism: Secondary | ICD-10-CM

## 2021-05-27 DIAGNOSIS — E871 Hypo-osmolality and hyponatremia: Secondary | ICD-10-CM | POA: Diagnosis not present

## 2021-05-27 DIAGNOSIS — J9601 Acute respiratory failure with hypoxia: Secondary | ICD-10-CM | POA: Diagnosis present

## 2021-05-27 DIAGNOSIS — I5023 Acute on chronic systolic (congestive) heart failure: Secondary | ICD-10-CM | POA: Diagnosis present

## 2021-05-27 DIAGNOSIS — N170 Acute kidney failure with tubular necrosis: Secondary | ICD-10-CM | POA: Diagnosis present

## 2021-05-27 DIAGNOSIS — Z4682 Encounter for fitting and adjustment of non-vascular catheter: Secondary | ICD-10-CM | POA: Diagnosis not present

## 2021-05-27 DIAGNOSIS — I5043 Acute on chronic combined systolic (congestive) and diastolic (congestive) heart failure: Secondary | ICD-10-CM

## 2021-05-27 DIAGNOSIS — I259 Chronic ischemic heart disease, unspecified: Secondary | ICD-10-CM | POA: Diagnosis not present

## 2021-05-27 DIAGNOSIS — R079 Chest pain, unspecified: Secondary | ICD-10-CM | POA: Diagnosis not present

## 2021-05-27 DIAGNOSIS — Z7189 Other specified counseling: Secondary | ICD-10-CM | POA: Diagnosis not present

## 2021-05-27 DIAGNOSIS — E119 Type 2 diabetes mellitus without complications: Secondary | ICD-10-CM | POA: Diagnosis not present

## 2021-05-27 DIAGNOSIS — F32A Depression, unspecified: Secondary | ICD-10-CM | POA: Diagnosis not present

## 2021-05-27 DIAGNOSIS — Z452 Encounter for adjustment and management of vascular access device: Secondary | ICD-10-CM | POA: Diagnosis not present

## 2021-05-27 DIAGNOSIS — Z955 Presence of coronary angioplasty implant and graft: Secondary | ICD-10-CM

## 2021-05-27 DIAGNOSIS — Z7984 Long term (current) use of oral hypoglycemic drugs: Secondary | ICD-10-CM

## 2021-05-27 DIAGNOSIS — E785 Hyperlipidemia, unspecified: Secondary | ICD-10-CM | POA: Diagnosis not present

## 2021-05-27 DIAGNOSIS — Z7982 Long term (current) use of aspirin: Secondary | ICD-10-CM

## 2021-05-27 DIAGNOSIS — D631 Anemia in chronic kidney disease: Secondary | ICD-10-CM | POA: Diagnosis not present

## 2021-05-27 DIAGNOSIS — J189 Pneumonia, unspecified organism: Secondary | ICD-10-CM | POA: Diagnosis not present

## 2021-05-27 DIAGNOSIS — Z9889 Other specified postprocedural states: Secondary | ICD-10-CM | POA: Diagnosis not present

## 2021-05-27 DIAGNOSIS — I517 Cardiomegaly: Secondary | ICD-10-CM | POA: Diagnosis not present

## 2021-05-27 DIAGNOSIS — I6521 Occlusion and stenosis of right carotid artery: Secondary | ICD-10-CM | POA: Diagnosis present

## 2021-05-27 DIAGNOSIS — R34 Anuria and oliguria: Secondary | ICD-10-CM | POA: Diagnosis not present

## 2021-05-27 DIAGNOSIS — Z79899 Other long term (current) drug therapy: Secondary | ICD-10-CM

## 2021-05-27 DIAGNOSIS — K219 Gastro-esophageal reflux disease without esophagitis: Secondary | ICD-10-CM | POA: Diagnosis present

## 2021-05-27 DIAGNOSIS — I9589 Other hypotension: Secondary | ICD-10-CM | POA: Diagnosis not present

## 2021-05-27 DIAGNOSIS — Z809 Family history of malignant neoplasm, unspecified: Secondary | ICD-10-CM

## 2021-05-27 LAB — COMPREHENSIVE METABOLIC PANEL
ALT: 11 U/L (ref 0–44)
AST: 22 U/L (ref 15–41)
Albumin: 3.2 g/dL — ABNORMAL LOW (ref 3.5–5.0)
Alkaline Phosphatase: 87 U/L (ref 38–126)
Anion gap: 11 (ref 5–15)
BUN: 24 mg/dL — ABNORMAL HIGH (ref 8–23)
CO2: 20 mmol/L — ABNORMAL LOW (ref 22–32)
Calcium: 8.3 mg/dL — ABNORMAL LOW (ref 8.9–10.3)
Chloride: 105 mmol/L (ref 98–111)
Creatinine, Ser: 1.85 mg/dL — ABNORMAL HIGH (ref 0.61–1.24)
GFR, Estimated: 40 mL/min — ABNORMAL LOW (ref >60)
Glucose, Bld: 161 mg/dL — ABNORMAL HIGH (ref 70–99)
Potassium: 3.8 mmol/L (ref 3.5–5.1)
Sodium: 136 mmol/L (ref 135–145)
Total Bilirubin: 0.7 mg/dL (ref 0.3–1.2)
Total Protein: 6.8 g/dL (ref 6.5–8.1)

## 2021-05-27 LAB — TROPONIN I (HIGH SENSITIVITY)
Troponin I (High Sensitivity): 1515 ng/L (ref <18)
Troponin I (High Sensitivity): 1998 ng/L (ref <18)
Troponin I (High Sensitivity): 2872 ng/L (ref <18)
Troponin I (High Sensitivity): 3964 ng/L (ref <18)
Troponin I (High Sensitivity): 7835 ng/L (ref <18)
Troponin I (High Sensitivity): 9195 ng/L (ref <18)

## 2021-05-27 LAB — CBC WITH DIFFERENTIAL/PLATELET
Abs Immature Granulocytes: 0.04 10*3/uL (ref 0.00–0.07)
Basophils Absolute: 0 10*3/uL (ref 0.0–0.1)
Basophils Relative: 0 %
Eosinophils Absolute: 0.1 10*3/uL (ref 0.0–0.5)
Eosinophils Relative: 1 %
HCT: 37.9 % — ABNORMAL LOW (ref 39.0–52.0)
Hemoglobin: 11.6 g/dL — ABNORMAL LOW (ref 13.0–17.0)
Immature Granulocytes: 0 %
Lymphocytes Relative: 20 %
Lymphs Abs: 2 10*3/uL (ref 0.7–4.0)
MCH: 25.9 pg — ABNORMAL LOW (ref 26.0–34.0)
MCHC: 30.6 g/dL (ref 30.0–36.0)
MCV: 84.6 fL (ref 80.0–100.0)
Monocytes Absolute: 0.9 10*3/uL (ref 0.1–1.0)
Monocytes Relative: 9 %
Neutro Abs: 7 10*3/uL (ref 1.7–7.7)
Neutrophils Relative %: 70 %
Platelets: 221 10*3/uL (ref 150–400)
RBC: 4.48 MIL/uL (ref 4.22–5.81)
RDW: 17.7 % — ABNORMAL HIGH (ref 11.5–15.5)
WBC: 10 10*3/uL (ref 4.0–10.5)
nRBC: 0 % (ref 0.0–0.2)

## 2021-05-27 LAB — IRON AND TIBC
Iron: 90 ug/dL (ref 45–182)
Saturation Ratios: 26 % (ref 17.9–39.5)
TIBC: 340 ug/dL (ref 250–450)
UIBC: 250 ug/dL

## 2021-05-27 LAB — HIV ANTIBODY (ROUTINE TESTING W REFLEX): HIV Screen 4th Generation wRfx: NONREACTIVE

## 2021-05-27 LAB — GLUCOSE, CAPILLARY
Glucose-Capillary: 120 mg/dL — ABNORMAL HIGH (ref 70–99)
Glucose-Capillary: 147 mg/dL — ABNORMAL HIGH (ref 70–99)

## 2021-05-27 LAB — LIPID PANEL
Cholesterol: 166 mg/dL (ref 0–200)
HDL: 47 mg/dL (ref >40)
LDL Cholesterol: 105 mg/dL — ABNORMAL HIGH (ref 0–99)
Total CHOL/HDL Ratio: 3.5 RATIO
Triglycerides: 70 mg/dL (ref <150)
VLDL: 14 mg/dL (ref 0–40)

## 2021-05-27 LAB — HEPARIN LEVEL (UNFRACTIONATED): Heparin Unfractionated: 0.32 IU/mL (ref 0.30–0.70)

## 2021-05-27 LAB — APTT: aPTT: 37 seconds — ABNORMAL HIGH (ref 24–36)

## 2021-05-27 LAB — PROTIME-INR
INR: 1.2 (ref 0.8–1.2)
Prothrombin Time: 14.8 seconds (ref 11.4–15.2)

## 2021-05-27 LAB — RESP PANEL BY RT-PCR (FLU A&B, COVID) ARPGX2
Influenza A by PCR: NEGATIVE
Influenza B by PCR: NEGATIVE
SARS Coronavirus 2 by RT PCR: NEGATIVE

## 2021-05-27 LAB — HEMOGLOBIN A1C
Hgb A1c MFr Bld: 5.2 % (ref 4.8–5.6)
Mean Plasma Glucose: 102.54 mg/dL

## 2021-05-27 LAB — FERRITIN: Ferritin: 6 ng/mL — ABNORMAL LOW (ref 24–336)

## 2021-05-27 MED ORDER — NITROGLYCERIN IN D5W 200-5 MCG/ML-% IV SOLN
0.0000 ug/min | INTRAVENOUS | Status: DC
  Administered 2021-05-27: 10 ug/min via INTRAVENOUS
  Administered 2021-05-28: 55 ug/min via INTRAVENOUS
  Administered 2021-05-28: 50 ug/min via INTRAVENOUS
  Administered 2021-05-29: 60 ug/min via INTRAVENOUS
  Filled 2021-05-27 (×4): qty 250

## 2021-05-27 MED ORDER — INSULIN ASPART 100 UNIT/ML IJ SOLN
0.0000 [IU] | Freq: Three times a day (TID) | INTRAMUSCULAR | Status: DC
  Administered 2021-05-28 – 2021-05-29 (×3): 1 [IU] via SUBCUTANEOUS
  Administered 2021-05-30: 3 [IU] via SUBCUTANEOUS
  Administered 2021-05-30: 2 [IU] via SUBCUTANEOUS
  Administered 2021-05-30: 1 [IU] via SUBCUTANEOUS
  Administered 2021-05-31: 2 [IU] via SUBCUTANEOUS
  Administered 2021-05-31: 1 [IU] via SUBCUTANEOUS
  Administered 2021-05-31: 2 [IU] via SUBCUTANEOUS

## 2021-05-27 MED ORDER — HEPARIN BOLUS VIA INFUSION
4000.0000 [IU] | Freq: Once | INTRAVENOUS | Status: AC
  Administered 2021-05-27: 4000 [IU] via INTRAVENOUS
  Filled 2021-05-27: qty 4000

## 2021-05-27 MED ORDER — AMLODIPINE BESYLATE 10 MG PO TABS
10.0000 mg | ORAL_TABLET | Freq: Every day | ORAL | Status: DC
  Administered 2021-05-27 – 2021-05-29 (×3): 10 mg via ORAL
  Filled 2021-05-27: qty 1
  Filled 2021-05-27: qty 2
  Filled 2021-05-27: qty 1

## 2021-05-27 MED ORDER — SODIUM CHLORIDE 0.9 % WEIGHT BASED INFUSION
1.0000 mL/kg/h | INTRAVENOUS | Status: DC
  Administered 2021-05-28 (×2): 1 mL/kg/h via INTRAVENOUS

## 2021-05-27 MED ORDER — MORPHINE SULFATE (PF) 2 MG/ML IV SOLN
1.0000 mg | INTRAVENOUS | Status: DC | PRN
Start: 2021-05-27 — End: 2021-05-30
  Administered 2021-05-28 – 2021-05-29 (×2): 1 mg via INTRAVENOUS
  Filled 2021-05-27 (×2): qty 1

## 2021-05-27 MED ORDER — NITROGLYCERIN 0.4 MG SL SUBL
0.4000 mg | SUBLINGUAL_TABLET | Freq: Once | SUBLINGUAL | Status: AC
  Administered 2021-05-27: 0.4 mg via SUBLINGUAL

## 2021-05-27 MED ORDER — ASPIRIN 81 MG PO CHEW
81.0000 mg | CHEWABLE_TABLET | ORAL | Status: AC
  Administered 2021-05-28: 81 mg via ORAL
  Filled 2021-05-27: qty 1

## 2021-05-27 MED ORDER — CARVEDILOL 6.25 MG PO TABS
6.2500 mg | ORAL_TABLET | Freq: Two times a day (BID) | ORAL | Status: DC
  Administered 2021-05-27 – 2021-05-28 (×2): 6.25 mg via ORAL
  Filled 2021-05-27 (×2): qty 1

## 2021-05-27 MED ORDER — SODIUM CHLORIDE 0.9% FLUSH
3.0000 mL | INTRAVENOUS | Status: DC | PRN
  Administered 2021-05-31 (×2): 3 mL via INTRAVENOUS

## 2021-05-27 MED ORDER — AMIODARONE HCL 200 MG PO TABS
200.0000 mg | ORAL_TABLET | Freq: Every day | ORAL | Status: DC
  Administered 2021-05-27 – 2021-05-29 (×3): 200 mg via ORAL
  Filled 2021-05-27 (×3): qty 1

## 2021-05-27 MED ORDER — ACETAMINOPHEN 325 MG PO TABS
650.0000 mg | ORAL_TABLET | ORAL | Status: DC | PRN
  Administered 2021-05-28 – 2021-05-31 (×4): 650 mg via ORAL
  Filled 2021-05-27 (×4): qty 2

## 2021-05-27 MED ORDER — SODIUM CHLORIDE 0.9% FLUSH
3.0000 mL | Freq: Two times a day (BID) | INTRAVENOUS | Status: DC
  Administered 2021-05-27 – 2021-06-02 (×12): 3 mL via INTRAVENOUS

## 2021-05-27 MED ORDER — SODIUM CHLORIDE 0.9 % IV SOLN: 250.0000 mL | INTRAVENOUS | Status: DC | PRN

## 2021-05-27 MED ORDER — SODIUM CHLORIDE 0.9% FLUSH: 3.0000 mL | INTRAVENOUS | Status: DC | PRN

## 2021-05-27 MED ORDER — ONDANSETRON HCL 4 MG/2ML IJ SOLN
4.0000 mg | Freq: Four times a day (QID) | INTRAMUSCULAR | Status: DC | PRN
  Administered 2021-05-30: 4 mg via INTRAVENOUS
  Filled 2021-05-27 (×2): qty 2

## 2021-05-27 MED ORDER — FENTANYL CITRATE PF 50 MCG/ML IJ SOSY
50.0000 ug | PREFILLED_SYRINGE | Freq: Once | INTRAMUSCULAR | Status: AC
  Administered 2021-05-27: 50 ug via INTRAVENOUS
  Filled 2021-05-27: qty 1

## 2021-05-27 MED ORDER — HEPARIN (PORCINE) 25000 UT/250ML-% IV SOLN
1650.0000 [IU]/h | INTRAVENOUS | Status: DC
  Administered 2021-05-27: 1300 [IU]/h via INTRAVENOUS
  Administered 2021-05-28: 1500 [IU]/h via INTRAVENOUS
  Administered 2021-05-28: 1350 [IU]/h via INTRAVENOUS
  Administered 2021-05-28: 1500 [IU]/h via INTRAVENOUS
  Administered 2021-05-29: 1650 [IU]/h via INTRAVENOUS
  Filled 2021-05-27 (×4): qty 250

## 2021-05-27 MED ORDER — ASPIRIN EC 81 MG PO TBEC
81.0000 mg | DELAYED_RELEASE_TABLET | Freq: Every day | ORAL | Status: DC
  Administered 2021-05-29: 81 mg via ORAL
  Filled 2021-05-27: qty 1

## 2021-05-27 MED ORDER — NITROGLYCERIN 1 MG/10 ML FOR IR/CATH LAB
INTRA_ARTERIAL | Status: AC
  Filled 2021-05-27: qty 10

## 2021-05-27 MED ORDER — ATORVASTATIN CALCIUM 80 MG PO TABS
80.0000 mg | ORAL_TABLET | Freq: Every evening | ORAL | Status: DC
  Administered 2021-05-27 – 2021-05-31 (×5): 80 mg via ORAL
  Filled 2021-05-27 (×4): qty 1

## 2021-05-27 MED ORDER — METOPROLOL TARTRATE 5 MG/5ML IV SOLN: 5.0000 mg | Freq: Four times a day (QID) | INTRAVENOUS | Status: DC | PRN

## 2021-05-27 MED ORDER — SODIUM CHLORIDE 0.9 % WEIGHT BASED INFUSION
3.0000 mL/kg/h | INTRAVENOUS | Status: DC
  Administered 2021-05-28: 3 mL/kg/h via INTRAVENOUS

## 2021-05-27 MED ORDER — NITROGLYCERIN 0.4 MG SL SUBL
0.4000 mg | SUBLINGUAL_TABLET | Freq: Once | SUBLINGUAL | Status: AC
  Administered 2021-05-27: 0.4 mg via SUBLINGUAL
  Filled 2021-05-27: qty 1

## 2021-05-27 MED ORDER — SODIUM CHLORIDE 0.9 % IV SOLN: INTRAVENOUS | Status: DC

## 2021-05-27 MED ORDER — SODIUM CHLORIDE 0.9 % IV SOLN
250.0000 mL | INTRAVENOUS | Status: DC | PRN
  Administered 2021-05-31: 250 mL via INTRAVENOUS

## 2021-05-27 MED ORDER — SODIUM CHLORIDE 0.9% FLUSH
3.0000 mL | Freq: Two times a day (BID) | INTRAVENOUS | Status: DC
  Administered 2021-05-27 – 2021-06-02 (×10): 3 mL via INTRAVENOUS

## 2021-05-27 NOTE — Progress Notes (Signed)
Date and time results received: 05/09/2021 1548 ? ? ?Test: Troponin ?Critical Value: 3964 ? ?Name of Provider Notified: 1551 ? ?Action taken! See Labs ?

## 2021-05-27 NOTE — H&P (Addendum)
?History and Physical  ? ? ?Patient: Darrell [name] [id number] DOB: Dec 27, 1955 ?DOA: 05/08/2021 ?DOS: the patient was seen and examined on 05/21/2021 ?PCP: [name], MD  ?Patient coming from: Home - lives with his wife. Uses WC frequently  ? ? ?Chief Complaint: chest pain  ? ?HPI: Darrell [name] is a 66 y.o. male with medical history significant of CKD stage 3, CAD s/p CABG in 2009 with subsequent PCIs, T2DM, HTN, HLD and hx of noncompliance who presented to ED with complaints of chest pain. He states he woke up and then started to have chest pain. Pain was substernal and rated as a 9/10 and described as sharp in nature. Pain has been constant. There was no radiation. He had diaphoresis, denies any N/V, shortness of breath or dizziness/syncope. He states it felt similar to previous heart attacks. His wife called [facility].  ? ?He does not smoke or drink alcohol.  ? ? ?Denies any fever/chills, vision changes/headaches, palpitations, shortness of breath or cough, abdominal pain, N/V/D, dysuria or leg swelling.  ? ? ?ER Course:  vitals: afebrile, bp: 203/105, HR: 104, RR: 18, oxygen: 96%RA ?Pertinent labs: hgb: 11.6, BUN: 24, creatinine: 1.85 (1.8-2.0), troponin [id number] ?CXR: no acute findings ?In ED: cardiology consulted, started on heparin gtt and nitro gtt.  ? ? ?Review of Systems: As mentioned in the history of present illness. All other systems reviewed and are negative. ?Past Medical History:  ?Diagnosis Date  ? CKD (chronic kidney disease), stage III ([place])   ? Claudication [facility])   ? Coronary artery disease   ? a. 08/2001 s/p DES to RCA/RI;  b. 07/2006 s/p DES to p/d LCX;  c. 11/2007 CABGx5 (LIMA->LAD, VG->D1, LRA->OM1, VG->PDA->LPL); c. 03/2015: Synergy DES to mid-Cx in the setting of NSTEMI; d. 11/2015 NSTEMI->Med Rx. e. NSTEMI 01/2018 -> med rx.  ? Diabetes mellitus type 2 in nonobese [facility])   ? a. A1c 7.0 0000000.  ? Diastolic dysfunction   ? a. 02/2016 Echo: EF 50-55%, gr1DD, Ao sclerosis, dil Ao root  (79mm), sev dil LA, triv TR, PASP 66mmHg.  ? Hyperlipidemia   ? Hypertension   ? Noncompliance   ? Right carotid bruit   ? a. noted 01/2018, will need OP eval when patient complies with follow-up.  ? ?Past Surgical History:  ?Procedure Laterality Date  ? CARDIAC CATHETERIZATION N/A 04/06/2015  ? Procedure: Left Heart Cath and Cors/Grafts Angiography;  Surgeon: Lorretta Harp, MD;  Location: [place] CV LAB;  Service: Cardiovascular;  Laterality: N/A;  ? CARDIAC CATHETERIZATION N/A 04/06/2015  ? Procedure: Coronary Stent Intervention;  Surgeon: Lorretta Harp, MD;  2.5 mm x 12 mm long Synergy DES followed by  2.5 mm x 16 mm long Synergy DES    ? CORONARY ARTERY BYPASS GRAFT  2009  ? 2009 LIMA to LAD, SVG to Diag, SVG to PDA and PL, left radial to OM  ? CORONARY BALLOON ANGIOPLASTY N/A 05/04/2018  ? Procedure: CORONARY BALLOON ANGIOPLASTY;  Surgeon: Lorretta Harp, MD;  Location: [place] CV LAB;  Service: Cardiovascular;  Laterality: N/A;  Prox CFX  ? LEFT HEART CATH N/A 10/24/2012  ? Procedure: LEFT HEART CATH;  Surgeon: Troy Sine, MD;  Location[facility] CATH LAB;  Service: Cardiovascular;  Laterality: N/A;  ? LEFT HEART CATH AND CORS/GRAFTS ANGIOGRAPHY N/A 05/04/2018  ? Procedure: LEFT HEART CATH AND CORS/GRAFTS ANGIOGRAPHY;  Surgeon: Lorretta Harp, MD;  Location: [place] CV LAB;  Service: Cardiovascular;  Laterality: N/A;  ?  LEFT HEART CATH AND CORS/GRAFTS ANGIOGRAPHY N/A 11/01/2020  ? Procedure: LEFT HEART CATH AND CORS/GRAFTS ANGIOGRAPHY;  Surgeon: Sherren Mocha, MD;  Location: [place] CV LAB;  Service: Cardiovascular;  Laterality: N/A;  ? ?Social History:  reports that he has never smoked. He has never used smokeless tobacco. He reports that he does not drink alcohol and does not use drugs. ? ?No Known Allergies ? ?Family History  ?Problem Relation Age of Onset  ? Heart failure Mother   ? Cancer Mother   ? CAD Mother 2  ? Heart failure Father   ? Kidney failure Brother   ? ? ?Prior to  Admission medications   ?Medication Sig Start Date End Date Taking? Authorizing Provider  ?amiodarone (PACERONE) 200 MG tablet Take 1 tablet (200 mg total) by mouth daily for 5 days. 12/29/20 05/20/2021 Yes Minus Breeding, MD  ?amLODipine (NORVASC) 10 MG tablet Take 1 tablet (10 mg total) by mouth daily. Please make PCP appointment. 04/16/21  Yes Minus Breeding, MD  ?aspirin 81 MG EC tablet Take 1 tablet (81 mg total) by mouth daily. Swallow whole. 11/02/20  Yes Jose Persia, MD  ?atorvastatin (LIPITOR) 80 MG tablet Take 1 tablet (80 mg total) by mouth every evening. 04/16/21  Yes Minus Breeding, MD  ?carvedilol (COREG) 6.25 MG tablet Take 1 tablet (6.25 mg total) by mouth 2 (two) times daily with a meal. 04/16/21  Yes Leonie Man, MD  ?metFORMIN (GLUCOPHAGE) 500 MG tablet Take 1 tablet (500 mg total) by mouth daily with breakfast. 11/02/20 01/06/21  Jose Persia, MD  ?nitroGLYCERIN (NITROSTAT) 0.4 MG SL tablet PLACE 1 TABLET UNDER THE TONGUE EVERY 5 MINUTES AS NEEDED FOR CHEST PAIN (UP TO 3 DOSES. IF TAKING 3RD DOSE CALL 911) ?Patient not taking: Reported on 05/04/2021 01/24/21 01/24/22  [name], MD  ? ? ?Physical Exam: ?Vitals:  ? 05/20/2021 1300 05/07/2021 1315 05/03/2021 1330 04/29/2021 1435  ?BP: (!) 179/90 (!) 170/92 (!) 188/87   ?Pulse: 89 95 100   ?Resp:   11   ?Temp:    98.7 ?F (37.1 ?C)  ?TempSrc:    Oral  ?SpO2: 98% 98% 99%   ?Weight:      ?Height:      ? ?General:  Appears calm and comfortable and is in NAD ?Eyes:  PERRL, EOMI, normal lids, iris ?ENT:  grossly normal hearing, lips & tongue, mmm; appropriate dentition ?Neck:  no LAD, masses or thyromegaly;+right carotid bruit  ?Cardiovascular:  RRR, no m/r/g. No LE edema.  ?Respiratory:   CTA bilaterally with no wheezes/rales/rhonchi.  Normal respiratory effort. ?Abdomen:  soft, NT, ND, NABS ?Back:   normal alignment, no CVAT ?Skin:  no rash or induration seen on limited exam ?Musculoskeletal:  grossly normal tone BUE/BLE, good ROM, no bony  abnormality ?Lower extremity:  No LE edema.  Limited foot exam with no ulcerations.  2+ distal pulses. ?Psychiatric:  grossly normal mood and affect, speech fluent and appropriate, AOx3 ?Neurologic:  CN 2-12 grossly intact, moves all extremities in coordinated fashion, sensation intact ? ? ?Radiological Exams on Admission: ?Independently reviewed - see discussion in A/P where applicable ? ?DG Chest Port 1 View ? ?Result Date: 05/08/2021 ?CLINICAL DATA:  Chest pain EXAM: PORTABLE CHEST 1 VIEW COMPARISON:  10/30/2020 FINDINGS: 0902 hours. The cardio pericardial silhouette is enlarged. The lungs are clear without focal pneumonia, edema, pneumothorax or pleural effusion. Status post CABG. Bones are diffusely demineralized. Telemetry leads overlie the chest. IMPRESSION: No acute cardiopulmonary findings. Electronically Signed  By: Misty Stanley M.D.   On: 05/09/2021 09:37  ? ?VAS US CAROTID ? ?Result Date: 05/18/2021 ?Carotid Arterial Duplex Study Patient Name:  ACHINTYA TERRERO  Date of Exam:   05/17/2021 Medical Rec #: [id number]          Accession #:    [id number] Date of Birth: 04-28-1955          Patient Gender: M Patient Age:   4 years Exam Location:  [facility] Procedure:      VAS US CAROTID Referring Phys: Orma Flaming --------------------------------------------------------------------------------  Indications: Right bruit. Performing Technologist: Archie Patten RVS  Examination Guidelines: A complete evaluation includes B-mode imaging, spectral Doppler, color Doppler, and power Doppler as needed of all accessible portions of each vessel. Bilateral testing is considered an integral part of a complete examination. Limited examinations for reoccurring indications may be performed as noted.  Right Carotid Findings: +---------+--------+-------+--------+---------------------------------+--------+          PSV cm/sEDV    StenosisPlaque Description               Comments                  cm/s                                                      +---------+--------+-------+--------+---------------------------------+--------+ CCA Prox 65                     heterogenous                              +---------+--------+-

## 2021-05-27 NOTE — ED Provider Notes (Addendum)
?[facility] ?Provider Note ? ? ?CSN: [id number] ?Arrival date & time: 05/22/2021  [id number] ? ?  ? ?History ? ?Chief Complaint  ?Patient presents with  ? Chest Pain  ? ? ?Darrell [name] is a 66 y.o. male.  Presented to ER due to concern for chest pain.  Patient states that he woke up this morning with pain in his chest, was up to 8 or 9 out of 10 in severity.  Got better with nitro, currently 7 out of 10.  Central, nonradiating.  No associated shortness of breath. ? ?Additional history from EMS, they gave aspirin and nitroglycerin.  Pain improved with nitro. ? ?Additional history from chart review -extensive CAD, prior CABG.  ? ?2. S/P CABG with known occlusion of the SVG-diag and SVG-RCA, continued patency of the SVG-OM and LIMA-LAD ? ?No targets for PCI. Recommend medical therapy. Consider EP eval for ICD, d/w Primary cardiology service.  ? ?HPI ? ?  ? ?Home Medications ?Prior to Admission medications   ?Medication Sig Start Date End Date Taking? Authorizing Provider  ?carvedilol (COREG) 6.25 MG tablet Take 1 tablet (6.25 mg total) by mouth 2 (two) times daily with a meal. 04/16/21   Leonie Man, MD  ?amiodarone (PACERONE) 200 MG tablet Take 1 tablet (200 mg total) by mouth daily for 5 days. 12/29/20 01/08/21  Minus Breeding, MD  ?amLODipine (NORVASC) 10 MG tablet Take 1 tablet (10 mg total) by mouth daily. Please make PCP appointment. 04/16/21   Minus Breeding, MD  ?aspirin 81 MG EC tablet Take 1 tablet (81 mg total) by mouth daily. Swallow whole. 11/02/20   Jose Persia, MD  ?atorvastatin (LIPITOR) 80 MG tablet Take 1 tablet (80 mg total) by mouth every evening. 04/16/21   Minus Breeding, MD  ?metFORMIN (GLUCOPHAGE) 500 MG tablet Take 1 tablet (500 mg total) by mouth daily with breakfast. 11/02/20 01/06/21  Jose Persia, MD  ?nitroGLYCERIN (NITROSTAT) 0.4 MG SL tablet Place 1 tablet (0.4 mg total) under the tongue every 5 (five) minutes as needed for chest pain. 09/06/20  12/14/20  Elgergawy, Silver Huguenin, MD  ?nitroGLYCERIN (NITROSTAT) 0.4 MG SL tablet PLACE 1 TABLET UNDER THE TONGUE EVERY 5 MINUTES AS NEEDED FOR CHEST PAIN (UP TO 3 DOSES. IF TAKING 3RD DOSE CALL 911) 01/24/21 01/24/22  [name], MD  ?   ? ?Allergies    ?Patient has no known allergies.   ? ?Review of Systems   ?Review of Systems  ?Constitutional:  Negative for chills and fever.  ?HENT:  Negative for ear pain and sore throat.   ?Eyes:  Negative for pain and visual disturbance.  ?Respiratory:  Positive for chest tightness. Negative for cough and shortness of breath.   ?Cardiovascular:  Positive for chest pain. Negative for palpitations.  ?Gastrointestinal:  Negative for abdominal pain and vomiting.  ?Genitourinary:  Negative for dysuria and hematuria.  ?Musculoskeletal:  Negative for arthralgias and back pain.  ?Skin:  Negative for color change and rash.  ?Neurological:  Negative for seizures and syncope.  ?All other systems reviewed and are negative. ? ?Physical Exam ?Updated Vital Signs ?BP (!) 187/93   Pulse 93   Temp 99.1 ?F (37.3 ?C) (Oral)   Resp (!) 9   Ht 6\' 1"  (1.854 m)   Wt 101.6 kg   SpO2 98%   BMI 29.55 kg/m?  ?Physical Exam ?Vitals and nursing note reviewed.  ?Constitutional:   ?   General: He is not in acute distress. ?  Appearance: He is well-developed.  ?HENT:  ?   Head: Normocephalic and atraumatic.  ?Eyes:  ?   Conjunctiva/sclera: Conjunctivae normal.  ?Cardiovascular:  ?   Rate and Rhythm: Normal rate and regular rhythm.  ?   Heart sounds: No murmur heard. ?Pulmonary:  ?   Effort: Pulmonary effort is normal. No respiratory distress.  ?   Breath sounds: Normal breath sounds.  ?Abdominal:  ?   Palpations: Abdomen is soft.  ?   Tenderness: There is no abdominal tenderness.  ?Musculoskeletal:     ?   General: No swelling.  ?   Cervical back: Neck supple.  ?Skin: ?   General: Skin is warm and dry.  ?   Capillary Refill: Capillary refill takes less than 2 seconds.  ?Neurological:  ?   Mental  Status: He is alert.  ?Psychiatric:     ?   Mood and Affect: Mood normal.  ? ? ?ED Results / Procedures / Treatments   ?Labs ?(all labs ordered are listed, but only abnormal results are displayed) ?Labs Reviewed  ?CBC WITH DIFFERENTIAL/PLATELET - Abnormal; Notable for the following components:  ?    Result Value  ? Hemoglobin 11.6 (*)   ? HCT 37.9 (*)   ? MCH 25.9 (*)   ? RDW 17.7 (*)   ? All other components within normal limits  ?COMPREHENSIVE METABOLIC PANEL - Abnormal; Notable for the following components:  ? CO2 20 (*)   ? Glucose, Bld 161 (*)   ? BUN 24 (*)   ? Creatinine, Ser 1.85 (*)   ? Calcium 8.3 (*)   ? Albumin 3.2 (*)   ? GFR, Estimated 40 (*)   ? All other components within normal limits  ?LIPID PANEL - Abnormal; Notable for the following components:  ? LDL Cholesterol 105 (*)   ? All other components within normal limits  ?APTT - Abnormal; Notable for the following components:  ? aPTT 37 (*)   ? All other components within normal limits  ?TROPONIN I (HIGH SENSITIVITY) - Abnormal; Notable for the following components:  ? Troponin I (High Sensitivity) 1,515 (*)   ? All other components within normal limits  ?RESP PANEL BY RT-PCR (FLU A&B, COVID) ARPGX2  ?HEMOGLOBIN A1C  ?PROTIME-INR  ?HEPARIN LEVEL (UNFRACTIONATED)  ?TROPONIN I (HIGH SENSITIVITY)  ? ? ?EKG ?EKG Interpretation ? ?Date/Time:  Sunday May 27 2021 09:31:47 EDT ?Ventricular Rate:  109 ?PR Interval:  365 ?QRS Duration: 130 ?QT Interval:  366 ?QTC Calculation: 493 ?R Axis:   91 ?Text Interpretation: Sinus tachycardia Atrial premature complex Prolonged PR interval Left atrial enlargement Nonspecific intraventricular conduction delay Anteroseptal infarct, old Repol abnrm, severe global ischemia (LM/MVD) Confirmed by Madalyn Rob [phone]) on 04/30/2021 9:44:48 AM ? ?Radiology ?DG Chest Port 1 View ? ?Result Date: 05/05/2021 ?CLINICAL DATA:  Chest pain EXAM: PORTABLE CHEST 1 VIEW COMPARISON:  10/30/2020 FINDINGS: 0902 hours. The cardio  pericardial silhouette is enlarged. The lungs are clear without focal pneumonia, edema, pneumothorax or pleural effusion. Status post CABG. Bones are diffusely demineralized. Telemetry leads overlie the chest. IMPRESSION: No acute cardiopulmonary findings. Electronically Signed   By: Misty Stanley M.D.   On: 04/28/2021 09:37   ? ?Procedures ?Marland KitchenCritical Care ?Performed by: Lucrezia Starch, MD ?Authorized by: Lucrezia Starch, MD  ? ?Critical care provider statement:  ?  Critical care time (minutes):  39 ?  Critical care was necessary to treat or prevent imminent or life-threatening deterioration of the following conditions:  Cardiac  failure ?  Critical care was time spent personally by me on the following activities:  Development of treatment plan with patient or surrogate, discussions with consultants, evaluation of patient's response to treatment, examination of patient, ordering and review of laboratory studies, ordering and review of radiographic studies, ordering and performing treatments and interventions, pulse oximetry, re-evaluation of patient's condition and review of old charts  ? ? ?Medications Ordered in ED ?Medications  ?0.9 %  sodium chloride infusion ( Intravenous New Bag/Given 05/22/2021 0851)  ?heparin ADULT infusion 100 units/mL (25000 units/248mL) (1,300 Units/hr Intravenous New Bag/Given 05/17/2021 1027)  ?nitroGLYCERIN 50 mg in dextrose 5 % 250 mL (0.2 mg/mL) infusion (10 mcg/min Intravenous New Bag/Given 05/08/2021 1035)  ?nitroGLYCERIN (NITROSTAT) SL tablet 0.4 mg (0.4 mg Sublingual Given 05/08/2021 0850)  ?fentaNYL (SUBLIMAZE) injection 50 mcg (50 mcg Intravenous Given 05/20/2021 0851)  ?fentaNYL (SUBLIMAZE) injection 50 mcg (50 mcg Intravenous Given 05/16/2021 0932)  ?nitroGLYCERIN (NITROSTAT) SL tablet 0.4 mg (0.4 mg Sublingual Given 05/23/2021 0933)  ?heparin bolus via infusion 4,000 Units (4,000 Units Intravenous Bolus from Bag 05/11/2021 1029)  ?nitroGLYCERIN (NITROSTAT) SL tablet 0.4 mg (0.4 mg Sublingual  Given 05/11/2021 1020)  ? ? ?ED Course/ Medical Decision Making/ A&P ?Clinical Course as of 05/26/2021 1116  ?Sun May 27, 2021  ?[id number] I discussed with Dr. Ellyn Hack, he feels his EKG is unchanged from prior and he advises can

## 2021-05-27 NOTE — Assessment & Plan Note (Signed)
Hypertensive emergency in setting of possible medical noncompliance. Unsure how much this is contributing to NSTEMI ?Continue coreg and norvasc. Have room to titrate up his coreg if needed and heart rate allows ?On nitro drip for chest pain and will wean as tolerated ?PRN IV parameters will be placed  ?

## 2021-05-27 NOTE — Assessment & Plan Note (Signed)
Hx of VT arrest in 10/2020 ?Discussion with EP at that time of ICD placement; however, after discussion over risks/benefits patient declined and was continued on amiodarone which he does not appear to be taking on regular basis ?Has not followed up with cardiology since this time  ?Will continue telemetry/amiodarone.  ?

## 2021-05-27 NOTE — Progress Notes (Signed)
?  Elevated troponin- 7835 ?MD notified ?

## 2021-05-27 NOTE — Progress Notes (Signed)
ANTICOAGULATION CONSULT NOTE - Initial Consult ? ?Pharmacy Consult for heparin ?Indication: chest pain/ACS ? ?No Known Allergies ? ?Patient Measurements: ?Height: 6\' 1"  (185.4 cm) ?Weight: 101.6 kg (224 lb) ?IBW/kg (Calculated) : 79.9 ?Heparin Dosing Weight: 100 kg  ? ?Vital Signs: ?Temp: 98.7 ?F (37.1 ?C) (04/30 1435) ?Temp Source: Oral (04/30 1435) ?BP: 188/87 (04/30 1330) ?Pulse Rate: 100 (04/30 1330) ? ?Labs: ?Recent Labs  ?  2021/06/22 ?1779 06-22-21 ?1028 Jun 22, 2021 ?1244 Jun 22, 2021 ?1433 2021-06-22 ?1624  ?HGB 11.6*  --   --   --   --   ?HCT 37.9*  --   --   --   --   ?PLT 221  --   --   --   --   ?APTT 37*  --   --   --   --   ?LABPROT 14.8  --   --   --   --   ?INR 1.2  --   --   --   --   ?HEPARINUNFRC  --   --   --   --  0.32  ?CREATININE 1.85*  --   --   --   --   ?TROPONINIHS 1,515* 1,998* 2,872* 3,964*  --   ? ? ? ?Estimated Creatinine Clearance: 50.6 mL/min (A) (by C-G formula based on SCr of 1.85 mg/dL (H)). ? ? ?Medical History: ?Past Medical History:  ?Diagnosis Date  ? CKD (chronic kidney disease), stage III (HCC)   ? [facility])   ? Coronary artery disease   ? a. 08/2001 s/p DES to RCA/RI;  b. 07/2006 s/p DES to p/d LCX;  c. 11/2007 CABGx5 (LIMA->LAD, VG->D1, LRA->OM1, VG->PDA->LPL); c. 03/2015: Synergy DES to mid-Cx in the setting of NSTEMI; d. 11/2015 NSTEMI->Med Rx. e. NSTEMI 01/2018 -> med rx.  ? Diabetes mellitus type 2 in nonobese [facility])   ? a. A1c 7.0 09/2012.  ? Diastolic dysfunction   ? a. 02/2016 Echo: EF 50-55%, gr1DD, Ao sclerosis, dil Ao root (53mm), sev dil LA, triv TR, PASP .  ? Hyperlipidemia   ? Hypertension   ? Noncompliance   ? Right carotid bruit   ? a. noted 01/2018, will need OP eval when patient complies with follow-up.  ? ? ?Medications:  ?Medications Prior to Admission  ?Medication Sig Dispense Refill Last Dose  ? amiodarone (PACERONE) 200 MG tablet Take 1 tablet (200 mg total) by mouth daily for 5 days. 5 tablet 0 Past Week  ? amLODipine (NORVASC) 10 MG tablet Take 1  tablet (10 mg total) by mouth daily. Please make PCP appointment. 30 tablet 0 Past Week  ? aspirin 81 MG EC tablet Take 1 tablet (81 mg total) by mouth daily. Swallow whole. 30 tablet 1 2021/06/22  ? atorvastatin (LIPITOR) 80 MG tablet Take 1 tablet (80 mg total) by mouth every evening. 30 tablet 0 Past Week  ? carvedilol (COREG) 6.25 MG tablet Take 1 tablet (6.25 mg total) by mouth 2 (two) times daily with a meal. 28 tablet 0 Past Week  ? metFORMIN (GLUCOPHAGE) 500 MG tablet Take 1 tablet (500 mg total) by mouth daily with breakfast. 30 tablet 1   ? nitroGLYCERIN (NITROSTAT) 0.4 MG SL tablet PLACE 1 TABLET UNDER THE TONGUE EVERY 5 MINUTES AS NEEDED FOR CHEST PAIN (UP TO 3 DOSES. IF TAKING 3RD DOSE CALL 911) (Patient not taking: Reported on 06/22/2021) 25 tablet 2 Not Taking  ? ? ?Assessment: ?58 YOM with chest pain and elevated troponin concerning for ACS. Pharmacy consulted  to start IV heparin.  ? ?H/H low, Plt elevated  ? ?Heparin level came back in goal tonight but on the lower side. We will increase slightly and check in Am.  ? ?Goal of Therapy:  ?Heparin level 0.3-0.7 units/ml ?Monitor platelets by anticoagulation protocol: Yes ?  ?Plan:  ?-Increase heparin infusion to 1350 units/hr.  ?-Monitor daily HL, CBC and s/s of bleeding  ? ?Onnie Boer, PharmD, BCIDP, AAHIVP, CPP ?Infectious Disease Pharmacist ?05/15/2021 6:00 PM ? ? ? ? ?

## 2021-05-27 NOTE — Progress Notes (Signed)
Carotid duplex has been completed.  ? ?Preliminary results in CV Proc.  ? ?Travez Stancil Caree Wolpert ?05/24/2021 3:25 PM    ?

## 2021-05-27 NOTE — Consult Note (Addendum)
Cardiology Consultation:   Patient ID: Darrell [name] MRN: [id number]; DOB: 10-Jul-1955  Admit date: 05/27/2021 Date of Consult: 05/27/2021  PCP:  Hoy Register, MD   [facility] Providers Cardiologist:  [name], MD   Patient Profile:   Darrell [name] is a 66 y.o. male with a PMH of CAD s/p CABG in 2009 with subsequent PCI's, HTN, HLD, DM type II, CKD stage IIIb who is being seen 05/27/2021 for the evaluation of NSTEMI at the request of Dr. Stevie Kern.  History of Present Illness:   Darrell [name] has a longstanding history of CAD.  He underwent CABG in 2009 and since that time has undergone several PCI's.  His last cardiac catheterization 10/2020 occurred during a hospitalization for VT arrest and showed severe native three-vessel CAD with CTO of LAD, LCx, and RCA as well as occluded SVG to RCA and SVG to diagonal with ongoing patency of SVG to OM and LIMA to LAD.  He had evidence of L > R collaterals to dRCA.  It was felt there were no targets for PCI and he was recommended for medical therapy and consideration for ICD implant given recent VT arrest.  He was seen by EP that admission and after discussion of risks/benefits of ICD patient declined procedure and he was continued on amiodarone.  He has not been seen outpatient by cardiology since that time though in the past has reported his biggest limiting factor with compliance has been transportation.  He presented to the ED 05/27/2021 with complaints of chest pain.  EMS initially called and as a STEMI however after review by Dr. Herbie Baltimore, code STEMI was canceled.  He reports sudden onset of chest pain after waking this morning rated as a 9 out of 10 substernal.  He reported associated diaphoresis but no shortness of breath, nausea, vomiting, dizziness, lightheadedness, or syncope.  He reports being overall stable since his hospitalization 10/2020.  He has not followed with cardiology since that time but denies any recent anginal  complaints.  He has not had any shortness of breath at rest, DOE, palpitations, lower extremity edema, orthopnea, or PND.  He reports compliance with his medications.  He does not have a blood pressure cuff at home so is unable to monitor BPs.  Vitals with markedly elevated blood pressure to 103/105 on presentation, minimally improved to 180s/90s, intermittent tachycardia to the 110s. Labs notable for creatinine 1.8 (baseline), electrolytes WNL, HsTrop 1515, Hgb 11.6, PLT 221, A1c 5.2, LDL 105, influenza/COVID-19 negative.  CXR without acute findings. EKG with sinus tachycardia, rate 109 bpm, PACs, nonspecific IVCD, lateral ST, no STE, nonspecific ST-T wave abnormalities.  Past Medical History:  Diagnosis Date   CKD (chronic kidney disease), stage III (HCC)    Claudication (HCC)    Coronary artery disease    a. 08/2001 s/p DES to RCA/RI;  b. 07/2006 s/p DES to p/d LCX;  c. 11/2007 CABGx5 (LIMA->LAD, VG->D1, LRA->OM1, VG->PDA->LPL); c. 03/2015: Synergy DES to mid-Cx in the setting of NSTEMI; d. 11/2015 NSTEMI->Med Rx. e. NSTEMI 01/2018 -> med rx.   Diabetes mellitus type 2 in nonobese [facility])    a. A1c 7.0 09/2012.   Diastolic dysfunction    a. 02/2016 Echo: EF 50-55%, gr1DD, Ao sclerosis, dil Ao root (42mm), sev dil LA, triv TR, PASP .   Hyperlipidemia    Hypertension    Noncompliance    Right carotid bruit    a. noted 01/2018, will need OP eval when patient complies with follow-up.  Past Surgical History:  Procedure Laterality Date   CARDIAC CATHETERIZATION N/A 04/06/2015   Procedure: Left Heart Cath and Cors/Grafts Angiography;  Surgeon: Runell Gess, MD;  Location: [facility] INVASIVE CV LAB;  Service: Cardiovascular;  Laterality: N/A;   CARDIAC CATHETERIZATION N/A 04/06/2015   Procedure: Coronary Stent Intervention;  Surgeon: Runell Gess, MD;  2.5 mm x 12 mm long Synergy DES followed by  2.5 mm x 16 mm long Synergy DES     CORONARY ARTERY BYPASS GRAFT  2009   2009 LIMA to LAD, SVG to  Diag, SVG to PDA and PL, left radial to OM   CORONARY BALLOON ANGIOPLASTY N/A 05/04/2018   Procedure: CORONARY BALLOON ANGIOPLASTY;  Surgeon: Runell Gess, MD;  Location: [facility] INVASIVE CV LAB;  Service: Cardiovascular;  Laterality: N/A;  Prox CFX   LEFT HEART CATH N/A 10/24/2012   Procedure: LEFT HEART CATH;  Surgeon: Lennette Bihari, MD;  Location: [facility] CATH LAB;  Service: Cardiovascular;  Laterality: N/A;   LEFT HEART CATH AND CORS/GRAFTS ANGIOGRAPHY N/A 05/04/2018   Procedure: LEFT HEART CATH AND CORS/GRAFTS ANGIOGRAPHY;  Surgeon: Runell Gess, MD;  Location: [facility] INVASIVE CV LAB;  Service: Cardiovascular;  Laterality: N/A;   LEFT HEART CATH AND CORS/GRAFTS ANGIOGRAPHY N/A 11/01/2020   Procedure: LEFT HEART CATH AND CORS/GRAFTS ANGIOGRAPHY;  Surgeon: Tonny Bollman, MD;  Location: [facility] INVASIVE CV LAB;  Service: Cardiovascular;  Laterality: N/A;     Home Medications:  Prior to Admission medications   Medication Sig Start Date End Date Taking? Authorizing Provider  amiodarone (PACERONE) 200 MG tablet Take 1 tablet (200 mg total) by mouth daily for 5 days. 12/29/20 06/23/21 Yes [name], MD  amLODipine (NORVASC) 10 MG tablet Take 1 tablet (10 mg total) by mouth daily. Please make PCP appointment. 04/16/21  Yes [name], MD  aspirin 81 MG EC tablet Take 1 tablet (81 mg total) by mouth daily. Swallow whole. 11/02/20  Yes Verdene Lennert, MD  atorvastatin (LIPITOR) 80 MG tablet Take 1 tablet (80 mg total) by mouth every evening. 04/16/21  Yes [name], MD  carvedilol (COREG) 6.25 MG tablet Take 1 tablet (6.25 mg total) by mouth 2 (two) times daily with a meal. 04/16/21  Yes Marykay Lex, MD  metFORMIN (GLUCOPHAGE) 500 MG tablet Take 1 tablet (500 mg total) by mouth daily with breakfast. 11/02/20 01/06/21  Verdene Lennert, MD  nitroGLYCERIN (NITROSTAT) 0.4 MG SL tablet PLACE 1 TABLET UNDER THE TONGUE EVERY 5 MINUTES AS NEEDED FOR CHEST PAIN (UP TO 3 DOSES. IF TAKING 3RD DOSE CALL  911) Patient not taking: Reported on 23-Jun-2021 01/24/21 01/24/22  Hoy Register, MD    Inpatient Medications: Scheduled Meds:  Continuous Infusions:  sodium chloride 20 mL/hr at 06/23/2021 0851   heparin 1,300 Units/hr (Jun 23, 2021 1027)   nitroGLYCERIN 20 mcg/min (Jun 23, 2021 1128)   PRN Meds:   Allergies:   No Known Allergies  Social History:   Social History   Socioeconomic History   Marital status: Married    Spouse name: Not on file   Number of children: 3   Years of education: Not on file   Highest education level: Not on file  Occupational History   Not on file  Tobacco Use   Smoking status: Never   Smokeless tobacco: Never  Vaping Use   Vaping Use: Never used  Substance and Sexual Activity   Alcohol use: No    Comment: sts he drank for the first time in a long time  last night    Drug use: No   Sexual activity: Not on file  Other Topics Concern   Not on file  Social History Narrative   Lives at home with wife.     Social Determinants of Health   Financial Resource Strain: Not on file  Food Insecurity: Not on file  Transportation Needs: Not on file  Physical Activity: Not on file  Stress: Not on file  Social Connections: Not on file  Intimate Partner Violence: Not on file    Family History:    Family History  Problem Relation Age of Onset   Heart failure Mother    Cancer Mother    CAD Mother 28   Heart failure Father    Kidney failure Brother      ROS:  Please see the history of present illness.   All other ROS reviewed and negative.     Physical Exam/Data:   Vitals:   06/23/21 1115 Jun 23, 2021 1130 2021-06-23 1145 06-23-21 1200  BP: (!) 189/96 (!) 195/88 (!) 179/100 (!) 180/92  Pulse: 90 94 96 90  Resp: 14   20  Temp:      TempSrc:      SpO2: 97% 99% 98% 95%  Weight:      Height:        Intake/Output Summary (Last 24 hours) at 06-23-21 1214 Last data filed at 06-23-21 1127 Gross per 24 hour  Intake 3.59 ml  Output --  Net 3.59 ml       06-23-21    8:38 AM 11/02/2020    4:02 AM 11/28/2020    5:00 AM  Last 3 Weights  Weight (lbs) 224 lb 219 lb 5.7 oz 221 lb 1.6 oz  Weight (kg) 101.606 kg 99.5 kg 100.29 kg     Body mass index is 29.55 kg/m.  General: Somewhat disheveled, appearing intermittently uncomfortable during our conversation but overall in NAD HEENT: Sclera anicteric Neck: no JVD Vascular: No carotid bruits; Distal pulses 2+ bilaterally Cardiac:  normal S1, S2; tachycardic, regular rhythm; no murmurs, rubs, gallops Lungs:  clear to auscultation bilaterally, no wheezing, rhonchi or rales  Abd: soft, nontender, no hepatomegaly  Ext: no edema Musculoskeletal:  No deformities, BUE and BLE strength normal and equal Skin: warm and dry  Neuro:  CNs 2-12 intact, no focal abnormalities noted Psych:  Normal affect   EKG:  The EKG was personally reviewed and demonstrates:  sinus tachycardia, rate 109 bpm, PACs, nonspecific IVCD, lateral ST, no STE, nonspecific ST-T wave abnormalities. Telemetry:  Telemetry was personally reviewed and demonstrates: Sinus rhythm/sinus tachycardia  Relevant CV Studies: Cardiac catheterization 11/28/20:   Mid LAD to Dist LAD lesion is 90% stenosed with 100% stenosed side Kimmie Berggren in Ost 3rd Sept.   Ost LAD to Prox LAD lesion is 100% stenosed.   Mid Cx to Dist Cx lesion is 100% stenosed.   Mid RCA lesion is 100% stenosed.   Ost RCA to Prox RCA lesion is 99% stenosed.   Origin to Prox Graft lesion is 100% stenosed.   Origin to Prox Graft lesion is 100% stenosed.   Ost Cx to Prox Cx lesion is 100% stenosed.   Severe native 3 vessel CAD Total occlusion of the LAD with severe diffuse disease beyond the LIMA insertion Total occlusion of the LCx Total occlusion of the RCA, distal branches collateralized by septal perforator branches of the LAD visualized on the LIMA injections 2. S/P CABG with known occlusion of the SVG-diag and SVG-RCA, continued  patency of the SVG-OM and LIMA-LAD    No targets for PCI. Recommend medical therapy. Consider EP eval for ICD, d/w Primary cardiology service.    Diagnostic Dominance: Right   Echocardiogram 09/2020: 1. Hypokinesis of the inferior wall (base/mid) and apex. NO significant  change from echo from [facility]g 2022.Marland Kitchen Left ventricular ejection fraction, by  estimation, is 50 to 55%. The left ventricle has low normal function.  There is moderate left ventricular  hypertrophy.   2. Right ventricular systolic function is normal. The right ventricular  size is normal. There is normal pulmonary artery systolic pressure.   3. The mitral valve is normal in structure. Mild mitral valve  regurgitation.   4. The aortic valve is normal in structure. Aortic valve regurgitation is  trivial. Mild aortic valve sclerosis is present, with no evidence of  aortic valve stenosis.   5. The inferior vena cava is dilated in size with <50% respiratory  variability, suggesting right atrial pressure of 15 mmHg.    Laboratory Data:  High Sensitivity Troponin:   Recent Labs  Lab 05/27/21 0845 05/27/21 1028  TROPONINIHS 1,515* 1,998*     Chemistry Recent Labs  Lab 05/27/21 0845  NA 136  K 3.8  CL 105  CO2 20*  GLUCOSE 161*  BUN 24*  CREATININE 1.85*  CALCIUM 8.3*  GFRNONAA 40*  ANIONGAP 11    Recent Labs  Lab 05/27/21 0845  PROT 6.8  ALBUMIN 3.2*  AST 22  ALT 11  ALKPHOS 87  BILITOT 0.7   Lipids  Recent Labs  Lab 05/27/21 0845  CHOL 166  TRIG 70  HDL 47  LDLCALC 105*  CHOLHDL 3.5    Hematology Recent Labs  Lab 05/27/21 0845  WBC 10.0  RBC 4.48  HGB 11.6*  HCT 37.9*  MCV 84.6  MCH 25.9*  MCHC 30.6  RDW 17.7*  PLT 221   Thyroid No results for input(s): TSH, FREET4 in the last 168 hours.  BNPNo results for input(s): BNP, PROBNP in the last 168 hours.  DDimer No results for input(s): DDIMER in the last 168 hours.   Radiology/Studies:  DG Chest Port 1 View  Result Date: 05/27/2021 CLINICAL DATA:  Chest pain  EXAM: PORTABLE CHEST 1 VIEW COMPARISON:  10/30/2020 FINDINGS: 0902 hours. The cardio pericardial silhouette is enlarged. The lungs are clear without focal pneumonia, edema, pneumothorax or pleural effusion. Status post CABG. Bones are diffusely demineralized. Telemetry leads overlie the chest. IMPRESSION: No acute cardiopulmonary findings. Electronically Signed   By: Kennith Center M.D.   On: 05/27/2021 09:37     Assessment and Plan:   1.  NSTEMI in patient with CAD s/p CABG in 2009 with subsequent PCI's: Patient presented with sudden onset chest pain this morning with associated diaphoresis.  Prior to today no anginal symptoms since his discharge from the hospital 10/2020.  He has not had any exertional symptoms or symptoms concerning for CHF or recurrent arrhythmia.  His EKG revealed nonspecific ST-T wave abnormalities and nonspecific IVCD, overall unchanged from previous.  Troponins were elevated 1515> 1998.  He was started on a heparin drip, as well as a nitro drip with ongoing titration as he is not yet chest pain-free.  Current pain is rated as a 4/10.  His last cath 10/2020 showed total occlusion of native vessels, as well as occlusion of SVG-RCA and SVG-diagonal with no options for PCIHis case was discussed with Dr. Herbie Baltimore who felt it may be worthwhile to repeat a cardiac catheterization to  assess remaining grafts for possible intervention. The patient understands that risks included but are not limited to stroke (1 in 1000), death (1 in 1000), kidney failure [usually temporary] (1 in 500), bleeding (1 in 200), allergic reaction [possibly serious] (1 in 200).  The patient understands and agrees to proceed.  - We will plan for [facility] 05/28/2021 -Continue to trend troponin to peak - Continue heparin drip -Continue nitro drip - Repeat an echocardiogram to evaluate LV function and wall motion - Continue home aspirin, statin, beta-blocker  2.  Hypertensive emergency inpatient with baseline hypertension:  BP markedly elevated to 200s over 100s on presentation.  Unclear what role this is playing in #1.  He does report compliance with his medications however does not have a BP cuff at home to monitor blood pressures regularly. - Continue nitro drip - Resume home amlodipine and carvedilol  3.  History of VT arrest: No complaints of palpitations.  No evidence of VT on EKG/telemetry. - Monitor on telemetry - Monitor electrolytes closely and maintain K >4 and Mg >2 - Continue amiodarone for rhythm control - Continue carvedilol for rate control  4.  HLD: LDL 105.  He reports compliance with meds however compliance has been an issue in the past - Continue home atorvastatin 80 mg daily - May need to consider lipid clinic evaluation for PCSK9 inhibitor consideration  5.  CKD stage IIIb: Creatinine 1.8 which appears to be his baseline - We will plan to hydrate prior to cardiac catheterization - Continue to monitor closely and limit nephrotoxic agents     Risk Assessment/Risk Scores:   TIMI Risk Score for Unstable Angina or Non-ST Elevation MI:   The patient's TIMI risk score is 5, which indicates a 26% risk of all cause mortality, new or recurrent myocardial infarction or need for urgent revascularization in the next 14 days.          For questions or updates, please contact [facility] Please consult [email or web address] for contact info under    Signed, Beatriz Stallion, PA-C  05/27/2021 12:14 PM  Attending Note     Patient seen an discussed with PA [employer], I agree with her documentation. 66 yo male history of CAD with prior CABG, multiple PCIs, HTN, HL, DM2, CKD III, VT arrest 10/2020 patient turned down ICD consideration was managed with amiodarone. Presents with chest pain, sharp pain midchest similar to prior angina that started today.    WBC 10 Hgb 11.6 Plt 221  K 3.8 Cr 1.85 BUN 24 GFR 40 Hgb A1c 5.2 LDL 105 Trop 1515-->1998--> EKG ST, chronic lateral ST/T changes CXR no acute  process COVID neg Flu neg   10/2020 cath: ostial LAD occluded, mid LAD 90%, osital LCX occluded, RCA prox 99% and mid 100%. LIMA-LAD patent, SVG-RCA occluded, SVG-D1 occluded, SVG-OM patent. No targets for PCI.      1.CAD/NSTEMI - long history as reported above with prior CABG, mulitple PCIs - admit 10/2020 with NSTEMI, VT arrest. Cath as reported above, no PCI targets. - presents with recurrent NSTEMI. EKG chronic ST/T changes though some variability in severity, trop up to 1998 without peak.  - discussed with Dr Herbie Baltimore, based on prior anatomy would still recommend cath Monday. If disease to SVG-OM could intervene, no other targets as far as his native disease.  - on hep gtt, nitro gtt, continue home coreg, atorva, asa. No ACE/[name] given renal dysfunction.  -plan for cath Monday - obtain limited echo   2. History of VT  arrest - admitted 10/2020 with VT arrest - cath as reported above - he turned down ICD considartion, has been on amiodarone.    3. HTN - SBPs 190 on presentation, followed on NG gtt.    Dina Rich MD

## 2021-05-27 NOTE — Assessment & Plan Note (Signed)
Baseline creatinine of 1.8-2.0 ?Stable, continue to monitor  ?

## 2021-05-27 NOTE — Assessment & Plan Note (Signed)
Well controlled, A1C of 5.2 ?Hold metformin while inpatient ?SSI and accuchecks qac/hs  ?

## 2021-05-27 NOTE — Progress Notes (Incomplete)
Attending Note ? ? ?Patient seen an discussed with PA [employer], I agree with her documentation. 66 yo male history of CAD with prior CABG, multiple PCIs, HTN, HL, DM2, CKD III, VT arrest 10/2020 patient turned down ICD consideration was managed with amiodarone. Presents with chest pain, sharp pain midchest similar to prior angina that started today.  ? ? ? ?WBC 10 Hgb 11.6 Plt 221  K 3.8 Cr 1.85 BUN 24 GFR 40 Hgb A1c 5.2 LDL 105 ?Trop 1515-->1998--> ?EKG ST, chronic lateral ST/T changes ?CXR no acute process ?COVID neg Flu neg ? ?10/2020 cath: ostial LAD occluded, mid LAD 90%, osital LCX occluded, RCA prox 99% and mid 100%. LIMA-LAD patent, SVG-RCA occluded, SVG-D1 occluded, SVG-OM patent. No targets for PCI.  ? ? ?1.CAD/NSTEMI ?- long history as reported above with prior CABG, mulitple PCIs ?- admit 10/2020 with NSTEMI, VT arrest. Cath as reported above, no PCI targets. ?- presents with recurrent NSTEMI. EKG chronic ST/T changes though some variability in severity, trop up to 1998 without peak.  ?- discussed with Dr Ellyn Hack, based on prior anatomy would still recommend cath Monday. If disease to SVG-OM could intervene, no other targets as far as his native disease.  ?- on hep gtt, nitro gtt, continue home coreg, atorva, asa. No ACE/[name] given renal dysfunction.  ?-plan for cath Monday ?- obtain limited echo ? ?2. History of VT arrest ?- admitted 10/2020 with VT arrest ?- cath as reported above ?- he turned down ICD considartion, has been on amiodarone.  ? ? ?Carlyle Dolly MD ?

## 2021-05-27 NOTE — Assessment & Plan Note (Addendum)
66 year old with known CAD s/p CABG with severe 3 vessel CAD presenting with chest pain, HTN emergency and NSTEMI ?-obs to progressive ?-ASA given with EMS ?-troponin 8786>7672. Trend to peak  ?-heparin gtt ?-nitro gtt for chest pain, wean as tolerated  ?-cardiology consulted with plans for [facility] tomorrow ?-echo pending  ?-continue statin, coreg and aspirin  ?-last LHC in 10/2020 with severe native 3 vessel CAD and following findings:  ?  Total occlusion of the LAD with severe diffuse disease beyond the LIMA insertion ? Total occlusion of the LCx ? Total occlusion of the RCA, distal branches collateralized by septal perforator branches of the LAD visualized on the  LIMA injections ?-no targets for PCI and recommended medical therapy  ?

## 2021-05-27 NOTE — Assessment & Plan Note (Addendum)
No hx of [place] with poor f/u ?Check carotid doppler--->80-99% right ICA stenosis and left 60-79% stenosis. Discussed with on call vascular and they recommended outpatient f/u, continued medical therapy.  ?Continue ASA/statin  ?

## 2021-05-27 NOTE — Assessment & Plan Note (Addendum)
Hx of anemia with hx of CKD ?Will check iron studies and monitor  ?Encourage colon cancer screening outpatient  ?

## 2021-05-27 NOTE — Progress Notes (Signed)
ANTICOAGULATION CONSULT NOTE - Initial Consult ? ?Pharmacy Consult for heparin ?Indication: chest pain/ACS ? ?No Known Allergies ? ?Patient Measurements: ?Height: 6\' 1"  (185.4 cm) ?Weight: 101.6 kg (224 lb) ?IBW/kg (Calculated) : 79.9 ?Heparin Dosing Weight: 100 kg  ? ?Vital Signs: ?Temp: 99.1 ?F (37.3 ?C) (04/30 0840) ?Temp Source: Oral (04/30 0840) ?BP: 195/98 (04/30 0945) ?Pulse Rate: 90 (04/30 0945) ? ?Labs: ?Recent Labs  ?  05/12/2021 ?0845  ?HGB 11.6*  ?HCT 37.9*  ?PLT 221  ?APTT 37*  ?LABPROT 14.8  ?INR 1.2  ?CREATININE 1.85*  ?TROPONINIHS 1,515*  ? ? ?Estimated Creatinine Clearance: 50.6 mL/min (A) (by C-G formula based on SCr of 1.85 mg/dL (H)). ? ? ?Medical History: ?Past Medical History:  ?Diagnosis Date  ? CKD (chronic kidney disease), stage III ([place])   ? Claudication [facility])   ? Coronary artery disease   ? a. 08/2001 s/p DES to RCA/RI;  b. 07/2006 s/p DES to p/d LCX;  c. 11/2007 CABGx5 (LIMA->LAD, VG->D1, LRA->OM1, VG->PDA->LPL); c. 03/2015: Synergy DES to mid-Cx in the setting of NSTEMI; d. 11/2015 NSTEMI->Med Rx. e. NSTEMI 01/2018 -> med rx.  ? Diabetes mellitus type 2 in nonobese [facility])   ? a. A1c 7.0 0000000.  ? Diastolic dysfunction   ? a. 02/2016 Echo: EF 50-55%, gr1DD, Ao sclerosis, dil Ao root (46mm), sev dil LA, triv TR, PASP 56mmHg.  ? Hyperlipidemia   ? Hypertension   ? Noncompliance   ? Right carotid bruit   ? a. noted 01/2018, will need OP eval when patient complies with follow-up.  ? ? ?Medications:  ?(Not in a hospital admission)  ? ?Assessment: ?46 YOM with chest pain and elevated troponin concerning for ACS. Pharmacy consulted to start IV heparin.  ? ?H/H low, Plt elevated  ? ?Goal of Therapy:  ?Heparin level 0.3-0.7 units/ml ?Monitor platelets by anticoagulation protocol: Yes ?  ?Plan:  ?-Heparin 4000 units IV bolus followed by heparin infusion at 1300 units/hr.  ?-F/u 6 hr HL ?-Monitor daily HL, CBC and s/s of bleeding  ? ?Albertina Parr, PharmD., BCCCP ?Clinical Pharmacist ?Please refer to  AMION for unit-specific pharmacist  ? ? ?

## 2021-05-27 NOTE — ED Triage Notes (Signed)
Pt arrives via EMS from home with reports of central CP that started this morning. Pain currently 7/10. Pain before nitro 8/10.  ?EMS gave 324 ASA and 2 nitro. Denies SOB. Pt diaphoretic.  ?

## 2021-05-28 ENCOUNTER — Observation Stay (HOSPITAL_COMMUNITY): Payer: Medicare Other

## 2021-05-28 ENCOUNTER — Encounter (HOSPITAL_COMMUNITY): Admission: EM | Disposition: E | Payer: Self-pay | Source: Home / Self Care | Attending: Family Medicine

## 2021-05-28 DIAGNOSIS — E119 Type 2 diabetes mellitus without complications: Secondary | ICD-10-CM | POA: Diagnosis not present

## 2021-05-28 DIAGNOSIS — I161 Hypertensive emergency: Secondary | ICD-10-CM | POA: Diagnosis not present

## 2021-05-28 DIAGNOSIS — R079 Chest pain, unspecified: Secondary | ICD-10-CM | POA: Diagnosis not present

## 2021-05-28 DIAGNOSIS — D631 Anemia in chronic kidney disease: Secondary | ICD-10-CM | POA: Diagnosis not present

## 2021-05-28 DIAGNOSIS — I25110 Atherosclerotic heart disease of native coronary artery with unstable angina pectoris: Secondary | ICD-10-CM | POA: Diagnosis not present

## 2021-05-28 DIAGNOSIS — D649 Anemia, unspecified: Secondary | ICD-10-CM | POA: Diagnosis not present

## 2021-05-28 DIAGNOSIS — I10 Essential (primary) hypertension: Secondary | ICD-10-CM

## 2021-05-28 DIAGNOSIS — N1832 Chronic kidney disease, stage 3b: Secondary | ICD-10-CM | POA: Diagnosis not present

## 2021-05-28 DIAGNOSIS — J189 Pneumonia, unspecified organism: Secondary | ICD-10-CM | POA: Diagnosis not present

## 2021-05-28 DIAGNOSIS — I214 Non-ST elevation (NSTEMI) myocardial infarction: Secondary | ICD-10-CM | POA: Diagnosis not present

## 2021-05-28 DIAGNOSIS — I472 Ventricular tachycardia, unspecified: Secondary | ICD-10-CM | POA: Diagnosis not present

## 2021-05-28 DIAGNOSIS — I5023 Acute on chronic systolic (congestive) heart failure: Secondary | ICD-10-CM | POA: Diagnosis not present

## 2021-05-28 DIAGNOSIS — Z66 Do not resuscitate: Secondary | ICD-10-CM | POA: Diagnosis not present

## 2021-05-28 DIAGNOSIS — Z515 Encounter for palliative care: Secondary | ICD-10-CM | POA: Diagnosis not present

## 2021-05-28 DIAGNOSIS — E1122 Type 2 diabetes mellitus with diabetic chronic kidney disease: Secondary | ICD-10-CM | POA: Diagnosis not present

## 2021-05-28 DIAGNOSIS — J690 Pneumonitis due to inhalation of food and vomit: Secondary | ICD-10-CM | POA: Diagnosis not present

## 2021-05-28 DIAGNOSIS — N170 Acute kidney failure with tubular necrosis: Secondary | ICD-10-CM | POA: Diagnosis not present

## 2021-05-28 DIAGNOSIS — J9601 Acute respiratory failure with hypoxia: Secondary | ICD-10-CM | POA: Diagnosis not present

## 2021-05-28 DIAGNOSIS — I130 Hypertensive heart and chronic kidney disease with heart failure and stage 1 through stage 4 chronic kidney disease, or unspecified chronic kidney disease: Secondary | ICD-10-CM | POA: Diagnosis not present

## 2021-05-28 DIAGNOSIS — Z20822 Contact with and (suspected) exposure to covid-19: Secondary | ICD-10-CM | POA: Diagnosis not present

## 2021-05-28 DIAGNOSIS — E871 Hypo-osmolality and hyponatremia: Secondary | ICD-10-CM | POA: Diagnosis not present

## 2021-05-28 LAB — ECHOCARDIOGRAM COMPLETE
Area-P 1/2: 4.29 cm2
Height: 73 in
MV M vel: 5.19 m/s
MV Peak grad: 107.7 mmHg
P 1/2 time: 239 msec
Radius: 0.5 cm
S' Lateral: 3.8 cm
Weight: 3584 oz

## 2021-05-28 LAB — BASIC METABOLIC PANEL
Anion gap: 8 (ref 5–15)
BUN: 25 mg/dL — ABNORMAL HIGH (ref 8–23)
CO2: 23 mmol/L (ref 22–32)
Calcium: 8 mg/dL — ABNORMAL LOW (ref 8.9–10.3)
Chloride: 103 mmol/L (ref 98–111)
Creatinine, Ser: 1.75 mg/dL — ABNORMAL HIGH (ref 0.61–1.24)
GFR, Estimated: 43 mL/min — ABNORMAL LOW (ref >60)
Glucose, Bld: 117 mg/dL — ABNORMAL HIGH (ref 70–99)
Potassium: 4.2 mmol/L (ref 3.5–5.1)
Sodium: 134 mmol/L — ABNORMAL LOW (ref 135–145)

## 2021-05-28 LAB — CBC
HCT: 31 % — ABNORMAL LOW (ref 39.0–52.0)
Hemoglobin: 10 g/dL — ABNORMAL LOW (ref 13.0–17.0)
MCH: 26.1 pg (ref 26.0–34.0)
MCHC: 32.3 g/dL (ref 30.0–36.0)
MCV: 80.9 fL (ref 80.0–100.0)
Platelets: 173 10*3/uL (ref 150–400)
RBC: 3.83 MIL/uL — ABNORMAL LOW (ref 4.22–5.81)
RDW: 17.8 % — ABNORMAL HIGH (ref 11.5–15.5)
WBC: 9.6 10*3/uL (ref 4.0–10.5)
nRBC: 0 % (ref 0.0–0.2)

## 2021-05-28 LAB — GLUCOSE, CAPILLARY
Glucose-Capillary: 123 mg/dL — ABNORMAL HIGH (ref 70–99)
Glucose-Capillary: 117 mg/dL — ABNORMAL HIGH (ref 70–99)
Glucose-Capillary: 124 mg/dL — ABNORMAL HIGH (ref 70–99)
Glucose-Capillary: 138 mg/dL — ABNORMAL HIGH (ref 70–99)

## 2021-05-28 LAB — HEPARIN LEVEL (UNFRACTIONATED)
Heparin Unfractionated: 0.29 IU/mL — ABNORMAL LOW (ref 0.30–0.70)
Heparin Unfractionated: 0.14 IU/mL — ABNORMAL LOW (ref 0.30–0.70)
Heparin Unfractionated: 0.28 IU/mL — ABNORMAL LOW (ref 0.30–0.70)

## 2021-05-28 SURGERY — LEFT HEART CATH AND CORS/GRAFTS ANGIOGRAPHY
Anesthesia: LOCAL

## 2021-05-28 MED ORDER — FERROUS SULFATE 325 (65 FE) MG PO TABS
325.0000 mg | ORAL_TABLET | Freq: Every day | ORAL | Status: DC
  Administered 2021-05-29 – 2021-05-31 (×3): 325 mg via ORAL
  Filled 2021-05-28 (×3): qty 1

## 2021-05-28 MED ORDER — CARVEDILOL 12.5 MG PO TABS
12.5000 mg | ORAL_TABLET | Freq: Two times a day (BID) | ORAL | Status: DC
  Administered 2021-05-28 – 2021-05-31 (×6): 12.5 mg via ORAL
  Filled 2021-05-28 (×5): qty 1

## 2021-05-28 NOTE — Progress Notes (Signed)
ANTICOAGULATION CONSULT NOTE ? ?Pharmacy Consult for heparin ?Indication: chest pain/ACS ? ?No Known Allergies ? ?Patient Measurements: ?Height: 6\' 1"  (185.4 cm) ?Weight: 101.6 kg (224 lb) ?IBW/kg (Calculated) : 79.9 ?Heparin Dosing Weight: 100 kg  ? ?Vital Signs: ?Temp: 100.4 ?F (38 ?C) (05/01 0503) ?Temp Source: Oral (05/01 0503) ?BP: 163/82 (05/01 0503) ?Pulse Rate: 96 (05/01 0503) ? ?Labs: ?Recent Labs  ?  04/29/2021 ?[id number] 05/19/2021 ?1028 05/04/2021 ?1433 05/06/2021 ?1624 05/13/2021 ?1753 05/29/2021 ?[id number] 06/01/2021 ?1251  ?HGB 11.6*  --   --   --   --  10.0*  --   ?HCT 37.9*  --   --   --   --  31.0*  --   ?PLT 221  --   --   --   --  173  --   ?APTT 37*  --   --   --   --   --   --   ?LABPROT 14.8  --   --   --   --   --   --   ?INR 1.2  --   --   --   --   --   --   ?HEPARINUNFRC  --   --   --  0.32  --  0.29* 0.14*  ?CREATININE 1.85*  --   --   --   --  1.75*  --   ?TROPONINIHS 1,515*   < > 3,964* 7,835* 9,195*  --   --   ? < > = values in this interval not displayed.  ?Estimated Creatinine Clearance: 53.4 mL/min (A) (by C-G formula based on SCr of 1.75 mg/dL (H)). ? ?Medical History: ?Past Medical History:  ?Diagnosis Date  ? CKD (chronic kidney disease), stage III ([place])   ? Claudication [facility])   ? Coronary artery disease   ? a. 08/2001 s/p DES to RCA/RI;  b. 07/2006 s/p DES to p/d LCX;  c. 11/2007 CABGx5 (LIMA->LAD, VG->D1, LRA->OM1, VG->PDA->LPL); c. 03/2015: Synergy DES to mid-Cx in the setting of NSTEMI; d. 11/2015 NSTEMI->Med Rx. e. NSTEMI 01/2018 -> med rx.  ? Diabetes mellitus type 2 in nonobese [facility])   ? a. A1c 7.0 0000000.  ? Diastolic dysfunction   ? a. 02/2016 Echo: EF 50-55%, gr1DD, Ao sclerosis, dil Ao root (8mm), sev dil LA, triv TR, PASP 41mmHg.  ? Hyperlipidemia   ? Hypertension   ? Noncompliance   ? Right carotid bruit   ? a. noted 01/2018, will need OP eval when patient complies with follow-up.  ? ?  ? ?Assessment: ?71 YOM with chest pain and elevated troponin concerning for ACS. No anticoagulation prior to  admission. Pharmacy consulted for heparin.   ? ?Heparin level 0.14 is subtherapeutic on 1500 units/hr because heparin was held for cath for ~80 min when level was drawn. Patient refused cath so heparin was resumed 13:00. No bleeding when saw patient this morning.  ? ?  ?Goal of Therapy:  ?Heparin level 0.3-0.7 units/ml ?Monitor platelets by anticoagulation protocol: Yes ?  ?Plan:  ?Continue heparin infusion to 1500 units/hr.  ?F/u 8 hr HL  ?Monitor daily Heparin level, CBC and s/s of bleeding  ? ?Benetta Spar, PharmD, BCPS, BCCP ?Clinical Pharmacist ? ?Please check AMION for all [place] phone numbers ?After 10:00 PM, call [place] [phone] ? ?

## 2021-05-28 NOTE — Progress Notes (Signed)
ANTICOAGULATION CONSULT NOTE ? ?Pharmacy Consult for heparin ?Indication: chest pain/ACS ? ?No Known Allergies ? ?Patient Measurements: ?Height: 6\' 1"  (185.4 cm) ?Weight: 101.6 kg (224 lb) ?IBW/kg (Calculated) : 79.9 ?Heparin Dosing Weight: 100 kg  ? ?Vital Signs: ?Temp: 98.4 ?F (36.9 ?C) (04/30 2333) ?Temp Source: Oral (04/30 2333) ?BP: 142/74 (04/30 2333) ?Pulse Rate: 82 (04/30 2333) ? ?Labs: ?Recent Labs  ?  05/22/2021 ?[id number] 05/14/2021 ?1028 05/26/2021 ?1433 05/09/2021 ?1624 05/08/2021 ?1753 06/01/2021 ?[id number]  ?HGB 11.6*  --   --   --   --  10.0*  ?HCT 37.9*  --   --   --   --  31.0*  ?PLT 221  --   --   --   --  173  ?APTT 37*  --   --   --   --   --   ?LABPROT 14.8  --   --   --   --   --   ?INR 1.2  --   --   --   --   --   ?HEPARINUNFRC  --   --   --  0.32  --  0.29*  ?CREATININE 1.85*  --   --   --   --  1.75*  ?TROPONINIHS 1,515*   < > 3,964* 7,835* 9,195*  --   ? < > = values in this interval not displayed.  ? ?Estimated Creatinine Clearance: 53.4 mL/min (A) (by C-G formula based on SCr of 1.75 mg/dL (H)). ? ?Medical History: ?Past Medical History:  ?Diagnosis Date  ? CKD (chronic kidney disease), stage III ([place])   ? Claudication [facility])   ? Coronary artery disease   ? a. 08/2001 s/p DES to RCA/RI;  b. 07/2006 s/p DES to p/d LCX;  c. 11/2007 CABGx5 (LIMA->LAD, VG->D1, LRA->OM1, VG->PDA->LPL); c. 03/2015: Synergy DES to mid-Cx in the setting of NSTEMI; d. 11/2015 NSTEMI->Med Rx. e. NSTEMI 01/2018 -> med rx.  ? Diabetes mellitus type 2 in nonobese [facility])   ? a. A1c 7.0 0000000.  ? Diastolic dysfunction   ? a. 02/2016 Echo: EF 50-55%, gr1DD, Ao sclerosis, dil Ao root (66mm), sev dil LA, triv TR, PASP 6mmHg.  ? Hyperlipidemia   ? Hypertension   ? Noncompliance   ? Right carotid bruit   ? a. noted 01/2018, will need OP eval when patient complies with follow-up.  ? ? ?Medications:  ?Medications Prior to Admission  ?Medication Sig Dispense Refill Last Dose  ? amiodarone (PACERONE) 200 MG tablet Take 1 tablet (200 mg total) by mouth  daily for 5 days. 5 tablet 0 Past Week  ? amLODipine (NORVASC) 10 MG tablet Take 1 tablet (10 mg total) by mouth daily. Please make PCP appointment. 30 tablet 0 Past Week  ? aspirin 81 MG EC tablet Take 1 tablet (81 mg total) by mouth daily. Swallow whole. 30 tablet 1 05/06/2021  ? atorvastatin (LIPITOR) 80 MG tablet Take 1 tablet (80 mg total) by mouth every evening. 30 tablet 0 Past Week  ? carvedilol (COREG) 6.25 MG tablet Take 1 tablet (6.25 mg total) by mouth 2 (two) times daily with a meal. 28 tablet 0 Past Week  ? metFORMIN (GLUCOPHAGE) 500 MG tablet Take 1 tablet (500 mg total) by mouth daily with breakfast. 30 tablet 1   ? nitroGLYCERIN (NITROSTAT) 0.4 MG SL tablet PLACE 1 TABLET UNDER THE TONGUE EVERY 5 MINUTES AS NEEDED FOR CHEST PAIN (UP TO 3 DOSES. IF TAKING 3RD DOSE CALL 911) (Patient not taking:  Reported on 05/09/2021) 25 tablet 2 Not Taking  ? ? ?Assessment: ?58 YOM with chest pain and elevated troponin concerning for ACS. Pharmacy consulted to start IV heparin.  ? ?H/H low, Plt elevated  ? ?Heparin level subtherapeutic: 0.29, Hgb down slightly, no overt bleeding reported, no infusion issues reported ? ?Goal of Therapy:  ?Heparin level 0.3-0.7 units/ml ?Monitor platelets by anticoagulation protocol: Yes ?  ?Plan:  ?-Increase heparin infusion to 1500 units/hr.  ?-Monitor daily Heparin level, CBC and s/s of bleeding  ? ?Georga Bora, PharmD ?Clinical Pharmacist ?06/06/2021 5:02 AM ?Please check AMION for all [place] numbers ? ? ? ? ? ?

## 2021-05-28 NOTE — Progress Notes (Signed)
?PROGRESS NOTE ? ? ?[name]  [id number] DOB: [birth year] DOA: 05/26/2021 ?PCP: Darrell Rakes, MD  ?Brief Narrative:  ?89 white male CABG 2009 multiple PCI's-no targets? ?VT arrest/PEA arrest 10 to 15 minutes requiring amnio eventually transition to the same-was recommended AICD and was lost to follow-up by cardiology ?Present ED 05/09/2021 CT-initially code STEMI however subsequently canceled ?Troponin peak 9000-cardiology seeing going for cath ?Also DM TY 2 CKD 3 HTN HLD ? ?Lives with wife at home-tells me he has been having chest pain for about 1 month on and off and decided to "do nothing about it"-describes NYHA class III-IV symptoms currently ? ?Hospital-Problem based course ? ?NSTEMI ?Hypertensive urgency on admission ?HLD ?Prior VT arrest with PEA last admission 2022 ?Per cardiology ?Meds: Amiodarone 200, Norvasc 10, Coreg 6.25 twice daily, nitro gtt., as needed metoprolol injection, aspirin 81 ?R Carotid stenosis ?Will need outpatient VVS input-unclear if candidate for procedure-we will message on call to ensure not lost to follow-up ?DM TY 2 CKD 3 ?CBG 1 23-1 50 ?Creatinine not usual baseline ?Tmax 100.4-denies cough cold dysuria diarrhea-monitor only ? ?DVT prophylaxis: Heparin GTT ?Code Status: Full ?Family Communication: None present-to be updated by Cardiologist after catheterization ?Disposition:  ?Status is: Observation ?The patient will require care spanning > 2 midnights and should be moved to inpatient because:  ? ?Needs cath ?  ?Consultants:  ?Cardiology ? ?Procedures: Pending cardiac cath ? ?Antimicrobials: None ? ? ?Subjective: ?Awake coherent no distress sitting up in bed on oxygen denies fever chills nausea vomiting chest pain diarrhea ?No sick contacts recently ?At baseline can walk about 7 feet without fatigue or discomfort-then feels "tired"-does not specifically mention shortness of breath-does mention some discomfort in chest ? ?Objective: ?Vitals:  ? 05/06/2021 1435 05/10/2021  2005 05/25/2021 2333 06/16/2021 0503  ?BP:  125/66 (!) 142/74 (!) 163/82  ?Pulse:  76 82 96  ?Resp:   (!) 21 13  ?Temp: 98.7 ?F (37.1 ?C) 98.6 ?F (37 ?C) 98.4 ?F (36.9 ?C) (!) 100.4 ?F (38 ?C)  ?TempSrc: Oral Oral Oral Oral  ?SpO2:  99% 95% 99%  ?Weight:      ?Height:      ? ? ?Intake/Output Summary (Last 24 hours) at 06/01/2021 0919 ?Last data filed at 06/05/2021 0600 ?Gross per 24 hour  ?Intake 1849.45 ml  ?Output 950 ml  ?Net 899.45 ml  ? ?Filed Weights  ? 05/16/2021 0838  ?Weight: 101.6 kg  ? ? ?Examination: ? ?EOMI NCAT thick neck no JVD ?Thick beard ?No lymphadenopathy ?S1-S2 no murmur on monitors PVCs sinus rhythm ?Neuro intact no focal deficit ?ROM intact psych euthymic coherent ? ?Data Reviewed: personally reviewed  ? ?CBC ?   ?Component Value Date/Time  ? WBC 9.6 06/13/2021 0412  ? RBC 3.83 (L) 06/18/2021 0412  ? HGB 10.0 (L) 06/24/2021 0412  ? HCT 31.0 (L) 06/20/2021 0412  ? PLT 173 06/21/2021 0412  ? MCV 80.9 06/05/2021 0412  ? MCH 26.1 06/14/2021 0412  ? MCHC 32.3 06/04/2021 0412  ? RDW 17.8 (H) 06/16/2021 0412  ? LYMPHSABS 2.0 05/16/2021 0845  ? MONOABS 0.9 05/19/2021 0845  ? EOSABS 0.1 05/26/2021 0845  ? BASOSABS 0.0 04/28/2021 0845  ? ? ?  Latest Ref Rng & Units 06/24/2021  ?  4:12 AM 05/14/2021  ?  8:45 AM 11/02/2020  ?  1:26 AM  ?CMP  ?Glucose 70 - 99 mg/dL 117   161   145    ?BUN 8 - 23 mg/dL 25   24  21    ?Creatinine 0.61 - 1.24 mg/dL 1.75   1.85   1.83    ?Sodium 135 - 145 mmol/L 134   136   136    ?Potassium 3.5 - 5.1 mmol/L 4.2   3.8   4.4    ?Chloride 98 - 111 mmol/L 103   105   106    ?CO2 22 - 32 mmol/L 23   20   23     ?Calcium 8.9 - 10.3 mg/dL 8.0   8.3   8.2    ?Total Protein 6.5 - 8.1 g/dL  6.8     ?Total Bilirubin 0.3 - 1.2 mg/dL  0.7     ?Alkaline Phos 38 - 126 U/L  87     ?AST 15 - 41 U/L  22     ?ALT 0 - 44 U/L  11     ? ? ? ?Radiology Studies: ?DG Chest Port 1 View ? ?Result Date: 05/09/2021 ?CLINICAL DATA:  Chest pain EXAM: PORTABLE CHEST 1 VIEW COMPARISON:  10/30/2020 FINDINGS: 0902 hours. The  cardio pericardial silhouette is enlarged. The lungs are clear without focal pneumonia, edema, pneumothorax or pleural effusion. Status post CABG. Bones are diffusely demineralized. Telemetry leads overlie the chest. IMPRESSION: No acute cardiopulmonary findings. Electronically Signed   By: Misty Stanley M.D.   On: 05/26/2021 09:37  ? ?VAS US CAROTID ? ?Result Date: 05/02/2021 ?Carotid Arterial Duplex Study Patient Name:  Darrell [name]  Date of Exam:   05/18/2021 Medical Rec #: [id number]          Accession #:    [id number] Date of Birth: [birth year]          Patient Gender: M Patient Age:   66 years Exam Location:  [facility] Procedure:      VAS US CAROTID Referring Phys: Orma Flaming --------------------------------------------------------------------------------  Indications: Right bruit. Performing Technologist: Archie Patten RVS  Examination Guidelines: A complete evaluation includes B-mode imaging, spectral Doppler, color Doppler, and power Doppler as needed of all accessible portions of each vessel. Bilateral testing is considered an integral part of a complete examination. Limited examinations for reoccurring indications may be performed as noted.  Right Carotid Findings: +---------+--------+-------+--------+---------------------------------+--------+          PSV cm/sEDV    StenosisPlaque Description               Comments                  cm/s                                                     +---------+--------+-------+--------+---------------------------------+--------+ CCA Prox 65                     heterogenous                              +---------+--------+-------+--------+---------------------------------+--------+ CCA      52                     heterogenous                              Distal                                                                    +---------+--------+-------+--------+---------------------------------+--------+  ICA Prox  383     141    80-99%  heterogenous, irregular and                                               calcific                                  +---------+--------+-------+--------+---------------------------------+--------+ ICA Mid  103     37                                                       +---------+--------+-------+--------+---------------------------------+--------+ ICA      32      13                                                       Distal                                                                    +---------+--------+-------+--------+---------------------------------+--------+ ECA      288            >50%                                              +---------+--------+-------+--------+---------------------------------+--------+ +----------+--------+-------+--------+-------------------+           PSV cm/sEDV cmsDescribeArm Pressure (mmHG) +----------+--------+-------+--------+-------------------+ Subclavian117                                        +----------+--------+-------+--------+-------------------+ +---------+--------+--------+--------------+ VertebralPSV cm/sEDV cm/sNot identified +---------+--------+--------+--------------+  Left Carotid Findings: +---------+--------+-------+--------+---------------------------------+--------+          PSV cm/sEDV    StenosisPlaque Description               Comments                  cm/s                                                     +---------+--------+-------+--------+---------------------------------+--------+ CCA Prox 89      15             heterogenous                              +---------+--------+-------+--------+---------------------------------+--------+ CCA      61      9  heterogenous                              Distal                                                                     +---------+--------+-------+--------+---------------------------------+--------+ ICA Prox 262     66     60-79%  heterogenous, irregular and                                               calcific                                  +---------+--------+-------+--------+---------------------------------+--------+ ICA Mid  105     36

## 2021-05-28 NOTE — Progress Notes (Signed)
ANTICOAGULATION CONSULT NOTE ? ?Pharmacy Consult for heparin ?Indication: chest pain/ACS ? ?No Known Allergies ? ?Patient Measurements: ?Height: 6\' 1"  (185.4 cm) ?Weight: 101.6 kg (224 lb) ?IBW/kg (Calculated) : 79.9 ?Heparin Dosing Weight: 100 kg  ? ?Vital Signs: ?Temp: 98 ?F (36.7 ?C) (05/01 2338) ?Temp Source: Oral (05/01 2338) ?BP: 122/63 (05/01 2338) ?Pulse Rate: 80 (05/01 2338) ? ?Labs: ?Recent Labs  ?  05/24/2021 ?[id number] 04/30/2021 ?1028 05/07/2021 ?1433 05/01/2021 ?1624 05/12/2021 ?1624 05/04/2021 ?1753 06/09/2021 ?[id number] 06/22/2021 ?1251 06/07/2021 ?2207  ?HGB 11.6*  --   --   --   --   --  10.0*  --   --   ?HCT 37.9*  --   --   --   --   --  31.0*  --   --   ?PLT 221  --   --   --   --   --  173  --   --   ?APTT 37*  --   --   --   --   --   --   --   --   ?LABPROT 14.8  --   --   --   --   --   --   --   --   ?INR 1.2  --   --   --   --   --   --   --   --   ?HEPARINUNFRC  --   --   --  0.32   < >  --  0.29* 0.14* 0.28*  ?CREATININE 1.85*  --   --   --   --   --  1.75*  --   --   ?TROPONINIHS 1,515*   < > 3,964* 7,835*  --  9,195*  --   --   --   ? < > = values in this interval not displayed.  ? ?Estimated Creatinine Clearance: 53.4 mL/min (A) (by C-G formula based on SCr of 1.75 mg/dL (H)). ? ?Medical History: ?Past Medical History:  ?Diagnosis Date  ? CKD (chronic kidney disease), stage III ([place])   ? Claudication [facility])   ? Coronary artery disease   ? a. 08/2001 s/p DES to RCA/RI;  b. 07/2006 s/p DES to p/d LCX;  c. 11/2007 CABGx5 (LIMA->LAD, VG->D1, LRA->OM1, VG->PDA->LPL); c. 03/2015: Synergy DES to mid-Cx in the setting of NSTEMI; d. 11/2015 NSTEMI->Med Rx. e. NSTEMI 01/2018 -> med rx.  ? Diabetes mellitus type 2 in nonobese [facility])   ? a. A1c 7.0 0000000.  ? Diastolic dysfunction   ? a. 02/2016 Echo: EF 50-55%, gr1DD, Ao sclerosis, dil Ao root (80mm), sev dil LA, triv TR, PASP 82mmHg.  ? Hyperlipidemia   ? Hypertension   ? Noncompliance   ? Right carotid bruit   ? a. noted 01/2018, will need OP eval when patient complies with  follow-up.  ? ?  ?Assessment: ?41 YOM with chest pain and elevated troponin concerning for ACS. No anticoagulation prior to admission. Pharmacy consulted for heparin.   ? ?Heparin level slightly below goal: 0.28, no issues with infusion or overt s/sx of bleeding reported; will increase ~ 2 units/kg/hr ?  ?Goal of Therapy:  ?Heparin level 0.3-0.7 units/ml ?Monitor platelets by anticoagulation protocol: Yes ?  ?Plan:  ?Increase heparin infusion to 1700 units/hr.  ?F/u 8 hr HL  ?Monitor daily Heparin level, CBC and s/s of bleeding  ? ?Georga Bora, PharmD ?Clinical Pharmacist ?06/27/2021 11:48 PM ?Please check AMION for all [place] numbers ? ? ?

## 2021-05-28 NOTE — Progress Notes (Signed)
Echocardiogram ?2D Echocardiogram has been performed. ? ?Warren Lacy Tony Granquist RDCS ?06/21/2021, 10:17 AM ?

## 2021-05-28 NOTE — Progress Notes (Signed)
?   06/05/2021 1604  ?Assess: MEWS Score  ?Temp (!) 100.6 ?F (38.1 ?C)  ?BP (!) 142/69  ?Pulse Rate 86  ?ECG Heart Rate 85  ?Resp (!) 34  ?SpO2 97 %  ?O2 Device Nasal Cannula  ?Assess: MEWS Score  ?MEWS Temp 1  ?MEWS Systolic 0  ?MEWS Pulse 0  ?MEWS RR 2  ?MEWS LOC 0  ?MEWS Score 3  ?MEWS Score Color Yellow  ?Assess: if the MEWS score is Yellow or Red  ?Were vital signs taken at a resting state? Yes  ?Focused Assessment Change from prior assessment (see assessment flowsheet)  ?Early Detection of Sepsis Score *See Row Information* Medium  ?MEWS guidelines implemented *See Row Information* Yes  ?Treat  ?MEWS Interventions Administered prn meds/treatments  ?Pain Scale 0-10  ?Take Vital Signs  ?Increase Vital Sign Frequency  Yellow: Q 2hr X 2 then Q 4hr X 2, if remains yellow, continue Q 4hrs  ?Escalate  ?MEWS: Escalate Yellow: discuss with charge nurse/RN and consider discussing with provider and RRT  ?Notify: Charge Nurse/RN  ?Name of Charge Nurse/RN Notified Neita Goodnight RN  ?Date Charge Nurse/RN Notified 06/03/2021  ?Time Charge Nurse/RN Notified 1630  ?Notify: Provider  ?Provider Name/Title Elveria Royals  ?Date Provider Notified 06/21/2021  ?Time Provider Notified 1640  ?Notification Type Page  ?Notification Reason Other (Comment) ?(Yellow MEWS temp 100.6)  ?Document  ?Patient Outcome Other (Comment) ?(Patient stable. Denies any pain. PRN tylenol administered.)  ?Progress note created (see row info) Yes  ? ? ?

## 2021-05-28 NOTE — Progress Notes (Addendum)
Progress Note  Patient Name: Darrell [name] Date of Encounter: 05/28/2021  [facility] Cardiologist: [name], MD   Subjective   Had chest pain this morning, but now pain free after titration of nitro gtt.   Inpatient Medications    Scheduled Meds:  amiodarone  200 mg Oral Daily   amLODipine  10 mg Oral Daily   aspirin EC  81 mg Oral Daily   atorvastatin  80 mg Oral QPM   carvedilol  6.25 mg Oral BID WC   insulin aspart  0-9 Units Subcutaneous TID WC   sodium chloride flush  3 mL Intravenous Q12H   sodium chloride flush  3 mL Intravenous Q12H   Continuous Infusions:  sodium chloride 20 mL/hr at 05/27/21 1600   sodium chloride     sodium chloride     sodium chloride 1 mL/kg/hr (05/28/21 0828)   heparin 1,500 Units/hr (05/28/21 0511)   nitroGLYCERIN 55 mcg/min (05/28/21 0807)   PRN Meds: sodium chloride, sodium chloride, acetaminophen, metoprolol tartrate, morphine injection, ondansetron (ZOFRAN) IV, sodium chloride flush, sodium chloride flush   Vital Signs    Vitals:   05/27/21 1435 05/27/21 2005 05/27/21 2333 05/28/21 0503  BP:  125/66 (!) 142/74 (!) 163/82  Pulse:  76 82 96  Resp:   (!) 21 13  Temp: 98.7 F (37.1 C) 98.6 F (37 C) 98.4 F (36.9 C) (!) 100.4 F (38 C)  TempSrc: Oral Oral Oral Oral  SpO2:  99% 95% 99%  Weight:      Height:        Intake/Output Summary (Last 24 hours) at 05/28/2021 1017 Last data filed at 05/28/2021 0600 Gross per 24 hour  Intake 1849.45 ml  Output 950 ml  Net 899.45 ml      05/27/2021    8:38 AM 11/02/2020    4:02 AM 11/01/2020    5:00 AM  Last 3 Weights  Weight (lbs) 224 lb 219 lb 5.7 oz 221 lb 1.6 oz  Weight (kg) 101.606 kg 99.5 kg 100.29 kg      Telemetry    Sinus Rhythm with PVCs, PACs - Personally Reviewed  ECG    ST 102 bpm, incomplete LBBB, ST depression in inferolateral leads, slight ST elevation in v1-v2 - Personally Reviewed  Physical Exam   GEN: Disheveled, older WM sitting up in bed   Neck: No JVD Cardiac: RRR, no murmurs, rubs, or gallops.  Respiratory: Clear to auscultation bilaterally. GI: Soft, nontender, non-distended  MS: No edema; No deformity. Neuro:  Nonfocal  Psych: Normal affect   Labs    High Sensitivity Troponin:   Recent Labs  Lab 05/27/21 1028 05/27/21 1244 05/27/21 1433 05/27/21 1624 05/27/21 1753  TROPONINIHS 1,998* 2,872* 3,964* 7,835* 9,195*     Chemistry Recent Labs  Lab 05/27/21 0845 05/28/21 0412  NA 136 134*  K 3.8 4.2  CL 105 103  CO2 20* 23  GLUCOSE 161* 117*  BUN 24* 25*  CREATININE 1.85* 1.75*  CALCIUM 8.3* 8.0*  PROT 6.8  --   ALBUMIN 3.2*  --   AST 22  --   ALT 11  --   ALKPHOS 87  --   BILITOT 0.7  --   GFRNONAA 40* 43*  ANIONGAP 11 8    Lipids  Recent Labs  Lab 05/27/21 0845  CHOL 166  TRIG 70  HDL 47  LDLCALC 105*  CHOLHDL 3.5    Hematology Recent Labs  Lab 05/27/21 0845 05/28/21 0412  WBC  10.0 9.6  RBC 4.48 3.83*  HGB 11.6* 10.0*  HCT 37.9* 31.0*  MCV 84.6 80.9  MCH 25.9* 26.1  MCHC 30.6 32.3  RDW 17.7* 17.8*  PLT 221 173   Thyroid No results for input(s): TSH, FREET4 in the last 168 hours.  BNPNo results for input(s): BNP, PROBNP in the last 168 hours.  DDimer No results for input(s): DDIMER in the last 168 hours.   Radiology    DG Chest Port 1 View  Result Date: 05/27/2021 CLINICAL DATA:  Chest pain EXAM: PORTABLE CHEST 1 VIEW COMPARISON:  10/30/2020 FINDINGS: 0902 hours. The cardio pericardial silhouette is enlarged. The lungs are clear without focal pneumonia, edema, pneumothorax or pleural effusion. Status post CABG. Bones are diffusely demineralized. Telemetry leads overlie the chest. IMPRESSION: No acute cardiopulmonary findings. Electronically Signed   By: Kennith Center M.D.   On: 05/27/2021 09:37   VAS US CAROTID  Result Date: 05/27/2021 Carotid Arterial Duplex Study Patient Name:  Darrell [name]  Date of Exam:   05/27/2021 Medical Rec #: [id number]          Accession #:     [id number] Date of Birth: 07/04/1955          Patient Gender: M Patient Age:   66 years Exam Location:  [facility] Procedure:      VAS US CAROTID Referring Phys: Orland Mustard --------------------------------------------------------------------------------  Indications: Right bruit. Performing Technologist: [place] Ponder RVS  Examination Guidelines: A complete evaluation includes B-mode imaging, spectral Doppler, color Doppler, and power Doppler as needed of all accessible portions of each vessel. Bilateral testing is considered an integral part of a complete examination. Limited examinations for reoccurring indications may be performed as noted.  Right Carotid Findings: +---------+--------+-------+--------+---------------------------------+--------+          PSV cm/sEDV    StenosisPlaque Description               Comments                  cm/s                                                     +---------+--------+-------+--------+---------------------------------+--------+ CCA Prox 65                     heterogenous                              +---------+--------+-------+--------+---------------------------------+--------+ CCA      52                     heterogenous                              Distal                                                                    +---------+--------+-------+--------+---------------------------------+--------+ ICA Prox 383     141    80-99%  heterogenous, irregular and  calcific                                  +---------+--------+-------+--------+---------------------------------+--------+ ICA Mid  103     37                                                       +---------+--------+-------+--------+---------------------------------+--------+ ICA      32      13                                                       Distal                                                                     +---------+--------+-------+--------+---------------------------------+--------+ ECA      288            >50%                                              +---------+--------+-------+--------+---------------------------------+--------+ +----------+--------+-------+--------+-------------------+           PSV cm/sEDV cmsDescribeArm Pressure (mmHG) +----------+--------+-------+--------+-------------------+ Subclavian117                                        +----------+--------+-------+--------+-------------------+ +---------+--------+--------+--------------+ VertebralPSV cm/sEDV cm/sNot identified +---------+--------+--------+--------------+  Left Carotid Findings: +---------+--------+-------+--------+---------------------------------+--------+          PSV cm/sEDV    StenosisPlaque Description               Comments                  cm/s                                                     +---------+--------+-------+--------+---------------------------------+--------+ CCA Prox 89      15             heterogenous                              +---------+--------+-------+--------+---------------------------------+--------+ CCA      61      9              heterogenous                              Distal                                                                    +---------+--------+-------+--------+---------------------------------+--------+  ICA Prox 262     66     60-79%  heterogenous, irregular and                                               calcific                                  +---------+--------+-------+--------+---------------------------------+--------+ ICA Mid  105     36                                                       +---------+--------+-------+--------+---------------------------------+--------+ ICA      58      18                                                        Distal                                                                    +---------+--------+-------+--------+---------------------------------+--------+ ECA      391            >50%                                              +---------+--------+-------+--------+---------------------------------+--------+ +----------+--------+--------+--------+-------------------+           PSV cm/sEDV cm/sDescribeArm Pressure (mmHG) +----------+--------+--------+--------+-------------------+ Subclavian116                                         +----------+--------+--------+--------+-------------------+ +---------+--------+--+--------+--+---------+ VertebralPSV cm/s49EDV cm/s17Antegrade +---------+--------+--+--------+--+---------+   Summary: Right Carotid: Velocities in the right ICA are consistent with a 80-99%                stenosis. The ECA appears >50% stenosed. Left Carotid: Velocities in the left ICA are consistent with a 60-79% stenosis.               The ECA appears >50% stenosed. Vertebrals: Left vertebral artery demonstrates antegrade flow. Right vertebral             artery was not visualized. *See table(s) above for measurements and observations.  Electronically signed by Coral Else MD on 05/27/2021 at 7:35:22 PM.    Final     Cardiac Studies   Echo: pending   Patient Profile     66 y.o. male with a PMH of CAD s/p CABG in 2009 with subsequent PCI's, HTN, HLD, DM type II, CKD stage IIIb who is being seen 05/27/2021 for the evaluation of NSTEMI at the request of Dr.  Dykstra.    Assessment & Plan    NSTEMI in patient with CAD s/p CABG in 2009 with subsequent PCI's: Patient presented with sudden onset chest pain this morning with associated diaphoresis. His EKG revealed nonspecific ST-T wave abnormalities and nonspecific IVCD, overall unchanged from previous.  Troponins were elevated 1515> 9195. His last cath 10/2020 showed total occlusion of native vessels, as well as  occlusion of SVG-RCA and SVG-diagonal with no options for PCI. His case was discussed with Dr. Herbie Baltimore (on-call over the weekend) who felt it appropriate to repeat a cardiac catheterization to assess remaining grafts for possible intervention. Planned for cardiac cath today -- continue IV heparin, nitro, ASA, statin, BB -- Repeat an echo pending    Hypertensive emergency inpatient with baseline hypertension: BP markedly elevated to 200s over 100s on presentation.  He reported compliance with his medications however does not have a BP cuff at home to monitor blood pressures regularly. -- Continue nitro drip, amlodipine and carvedilol => titrate carvedilol 12.5 mg twice daily  Ischemic cardiomyopathy: Previous EF roughly 45%.  Current EF by echo 40 to 45%. => Titrate beta-blocker and continue amlodipine. ->  No active CHF symptoms.   History of VT arrest: No complaints of palpitations.  No evidence of VT on EKG/telemetry. Seen by EP in the past, declined ICD. -- Continue amiodarone, carvedilol   HLD: LDL 105.  He reports compliance with meds however compliance has been an issue in the past -- Continue home atorvastatin 80 mg daily -- May need to consider lipid clinic evaluation for PCSK9 inhibitor consideration   CKD stage IIIb: Creatinine 1.8>>1.75 this morning which appears at baseline (if not improved) -- receiving pre cath IVFs -> will DC since he has declined cath. -- follow BMET  Carotid Artery disease: 80-99% right ICA stenosis and left 60-79% stenosis -- outpatient follow up with [facility]   Noncompliance: recurrent issue, does not follow up in the office routinely. Reports recently moving to a house. Son helps with transportation and medications   For questions or updates, please contact [facility] Please consult [email or web address] for contact info under        Signed, Laverda Page, NP  05/28/2021, 10:17 AM    ATTENDING ATTESTATION  I have seen, examined and evaluated the  patient this morning on rounds along with Laverda Page, NP.  After reviewing all the available data and chart, we discussed the patients laboratory, study & physical findings as well as symptoms in detail. I agree with her findings, examination as well as impression recommendations as per our discussion.    Echo has been read: EF 45 to 50%.  Mildly decreased function.  Akinesis of apical anterior wall with mild HK of basal inferior and inferolateral wall.  GR 3 DD with moderate LVH.  Severe LA dilation indicating diastolic heart failure.  Mildly dilated RA.  Mild to moderate MR.  Mild to moderate AI with aortic sclerosis but no stenosis. => Stable compared to previous study.  I saw the patient today, and the plan had been diagnostic catheterization to assess his anatomy based on his presentation with elevated troponin and clear ACS type symptoms with abnormal EKG.  Non-STEMI is quite likely, however last time he had heart catheterization there was no PCI option.  He basically has a large SVG-OM and LIMA-LAD to a very small diffusely diseased LAD.  It is unlikely that his graft has occluded since he is now chest pain-free.  After our conversation, the Cath Lab team  went to go bring him down, and he indicated that he would prefer to simply do medical management.  As such, we will continue IV heparin and nitroglycerin for another 24 hours and likely convert from nitroglycerin infusion to long-acting Imdur.  His blood pressures have notably improved although starting to go back up again.  We will need to titrate antihypertensives as we wean off nitroglycerin infusion.  Already on amlodipine 10 mg along with carvedilol 6.25 mg twice daily -> we will increase carvedilol to 12.5 mg twice daily.  He is on combination of amiodarone plus carvedilol for history of VT.  Has declined ICD in the past.  EF is still 40 to 45%.  Stable cardiomyopathy with EF 40 to 45%.  He is on amlodipine and carvedilol. With  creatinine 1.75 as a notable improvement from previous levels, not on [name]/ACE inhibitor. We will need to consider the possibility of SGLT2 inhibitor for CHF, and very well may need diuretic on discharge.  We will reassess in the morning.  For now the plan will be to hold off on cardiac catheterization and simply continue medical management. Anticipate 72 hours of IV heparin -> wean off IV nitroglycerin in the morning. Would likely put on aspirin and Plavix.     Bryan Lemma, M.D., M.S. Interventional Cardiologist   Pager # [phone] Phone # [phone] [address] [place], [place] [address]

## 2021-05-28 DEATH — deceased

## 2021-05-29 DIAGNOSIS — I420 Dilated cardiomyopathy: Secondary | ICD-10-CM | POA: Diagnosis not present

## 2021-05-29 DIAGNOSIS — E1151 Type 2 diabetes mellitus with diabetic peripheral angiopathy without gangrene: Secondary | ICD-10-CM | POA: Diagnosis present

## 2021-05-29 DIAGNOSIS — I214 Non-ST elevation (NSTEMI) myocardial infarction: Secondary | ICD-10-CM | POA: Diagnosis present

## 2021-05-29 DIAGNOSIS — Z9911 Dependence on respirator [ventilator] status: Secondary | ICD-10-CM | POA: Diagnosis not present

## 2021-05-29 DIAGNOSIS — I252 Old myocardial infarction: Secondary | ICD-10-CM | POA: Diagnosis not present

## 2021-05-29 DIAGNOSIS — I5023 Acute on chronic systolic (congestive) heart failure: Secondary | ICD-10-CM | POA: Diagnosis present

## 2021-05-29 DIAGNOSIS — D631 Anemia in chronic kidney disease: Secondary | ICD-10-CM | POA: Diagnosis present

## 2021-05-29 DIAGNOSIS — I700 Atherosclerosis of aorta: Secondary | ICD-10-CM | POA: Diagnosis present

## 2021-05-29 DIAGNOSIS — Z91199 Patient's noncompliance with other medical treatment and regimen due to unspecified reason: Secondary | ICD-10-CM | POA: Diagnosis not present

## 2021-05-29 DIAGNOSIS — I2510 Atherosclerotic heart disease of native coronary artery without angina pectoris: Secondary | ICD-10-CM | POA: Diagnosis present

## 2021-05-29 DIAGNOSIS — R579 Shock, unspecified: Secondary | ICD-10-CM | POA: Diagnosis not present

## 2021-05-29 DIAGNOSIS — D649 Anemia, unspecified: Secondary | ICD-10-CM | POA: Diagnosis not present

## 2021-05-29 DIAGNOSIS — Z515 Encounter for palliative care: Secondary | ICD-10-CM | POA: Diagnosis not present

## 2021-05-29 DIAGNOSIS — I130 Hypertensive heart and chronic kidney disease with heart failure and stage 1 through stage 4 chronic kidney disease, or unspecified chronic kidney disease: Secondary | ICD-10-CM | POA: Diagnosis present

## 2021-05-29 DIAGNOSIS — I255 Ischemic cardiomyopathy: Secondary | ICD-10-CM | POA: Diagnosis present

## 2021-05-29 DIAGNOSIS — I5022 Chronic systolic (congestive) heart failure: Secondary | ICD-10-CM | POA: Diagnosis not present

## 2021-05-29 DIAGNOSIS — N183 Chronic kidney disease, stage 3 unspecified: Secondary | ICD-10-CM | POA: Diagnosis not present

## 2021-05-29 DIAGNOSIS — N170 Acute kidney failure with tubular necrosis: Secondary | ICD-10-CM | POA: Diagnosis present

## 2021-05-29 DIAGNOSIS — Z9889 Other specified postprocedural states: Secondary | ICD-10-CM | POA: Diagnosis not present

## 2021-05-29 DIAGNOSIS — Z951 Presence of aortocoronary bypass graft: Secondary | ICD-10-CM | POA: Diagnosis not present

## 2021-05-29 DIAGNOSIS — I161 Hypertensive emergency: Secondary | ICD-10-CM | POA: Diagnosis present

## 2021-05-29 DIAGNOSIS — N1832 Chronic kidney disease, stage 3b: Secondary | ICD-10-CM | POA: Diagnosis present

## 2021-05-29 DIAGNOSIS — E785 Hyperlipidemia, unspecified: Secondary | ICD-10-CM

## 2021-05-29 DIAGNOSIS — I25110 Atherosclerotic heart disease of native coronary artery with unstable angina pectoris: Secondary | ICD-10-CM | POA: Diagnosis not present

## 2021-05-29 DIAGNOSIS — Z20822 Contact with and (suspected) exposure to covid-19: Secondary | ICD-10-CM | POA: Diagnosis present

## 2021-05-29 DIAGNOSIS — I10 Essential (primary) hypertension: Secondary | ICD-10-CM | POA: Diagnosis not present

## 2021-05-29 DIAGNOSIS — I4901 Ventricular fibrillation: Secondary | ICD-10-CM | POA: Diagnosis not present

## 2021-05-29 DIAGNOSIS — Z4682 Encounter for fitting and adjustment of non-vascular catheter: Secondary | ICD-10-CM | POA: Diagnosis not present

## 2021-05-29 DIAGNOSIS — Z66 Do not resuscitate: Secondary | ICD-10-CM | POA: Diagnosis present

## 2021-05-29 DIAGNOSIS — Z452 Encounter for adjustment and management of vascular access device: Secondary | ICD-10-CM | POA: Diagnosis not present

## 2021-05-29 DIAGNOSIS — I472 Ventricular tachycardia, unspecified: Secondary | ICD-10-CM | POA: Diagnosis not present

## 2021-05-29 DIAGNOSIS — I259 Chronic ischemic heart disease, unspecified: Secondary | ICD-10-CM | POA: Diagnosis not present

## 2021-05-29 DIAGNOSIS — E119 Type 2 diabetes mellitus without complications: Secondary | ICD-10-CM | POA: Diagnosis not present

## 2021-05-29 DIAGNOSIS — I517 Cardiomegaly: Secondary | ICD-10-CM | POA: Diagnosis not present

## 2021-05-29 DIAGNOSIS — J189 Pneumonia, unspecified organism: Secondary | ICD-10-CM | POA: Diagnosis present

## 2021-05-29 DIAGNOSIS — I129 Hypertensive chronic kidney disease with stage 1 through stage 4 chronic kidney disease, or unspecified chronic kidney disease: Secondary | ICD-10-CM | POA: Diagnosis not present

## 2021-05-29 DIAGNOSIS — I4721 Torsades de pointes: Secondary | ICD-10-CM | POA: Diagnosis not present

## 2021-05-29 DIAGNOSIS — I6521 Occlusion and stenosis of right carotid artery: Secondary | ICD-10-CM | POA: Diagnosis present

## 2021-05-29 DIAGNOSIS — I249 Acute ischemic heart disease, unspecified: Secondary | ICD-10-CM | POA: Diagnosis present

## 2021-05-29 DIAGNOSIS — R079 Chest pain, unspecified: Secondary | ICD-10-CM | POA: Diagnosis not present

## 2021-05-29 DIAGNOSIS — J9601 Acute respiratory failure with hypoxia: Secondary | ICD-10-CM | POA: Diagnosis present

## 2021-05-29 DIAGNOSIS — I4729 Other ventricular tachycardia: Secondary | ICD-10-CM | POA: Diagnosis not present

## 2021-05-29 DIAGNOSIS — Z7189 Other specified counseling: Secondary | ICD-10-CM | POA: Diagnosis not present

## 2021-05-29 DIAGNOSIS — E1122 Type 2 diabetes mellitus with diabetic chronic kidney disease: Secondary | ICD-10-CM | POA: Diagnosis present

## 2021-05-29 DIAGNOSIS — E871 Hypo-osmolality and hyponatremia: Secondary | ICD-10-CM | POA: Diagnosis not present

## 2021-05-29 DIAGNOSIS — J690 Pneumonitis due to inhalation of food and vomit: Secondary | ICD-10-CM | POA: Diagnosis present

## 2021-05-29 LAB — CBC
HCT: 28.1 % — ABNORMAL LOW (ref 39.0–52.0)
Hemoglobin: 9 g/dL — ABNORMAL LOW (ref 13.0–17.0)
MCH: 26.3 pg (ref 26.0–34.0)
MCHC: 32 g/dL (ref 30.0–36.0)
MCV: 82.2 fL (ref 80.0–100.0)
Platelets: 153 10*3/uL (ref 150–400)
RBC: 3.42 MIL/uL — ABNORMAL LOW (ref 4.22–5.81)
RDW: 18.2 % — ABNORMAL HIGH (ref 11.5–15.5)
WBC: 7.4 10*3/uL (ref 4.0–10.5)
nRBC: 0 % (ref 0.0–0.2)

## 2021-05-29 LAB — HEPARIN LEVEL (UNFRACTIONATED): Heparin Unfractionated: 0.49 IU/mL (ref 0.30–0.70)

## 2021-05-29 LAB — GLUCOSE, CAPILLARY
Glucose-Capillary: 195 mg/dL — ABNORMAL HIGH (ref 70–99)
Glucose-Capillary: 140 mg/dL — ABNORMAL HIGH (ref 70–99)
Glucose-Capillary: 110 mg/dL — ABNORMAL HIGH (ref 70–99)
Glucose-Capillary: 126 mg/dL — ABNORMAL HIGH (ref 70–99)

## 2021-05-29 MED ORDER — CLOPIDOGREL BISULFATE 75 MG PO TABS
75.0000 mg | ORAL_TABLET | Freq: Every day | ORAL | Status: DC
  Administered 2021-05-30 – 2021-05-31 (×2): 75 mg via ORAL
  Filled 2021-05-29 (×2): qty 1

## 2021-05-29 MED ORDER — NITROGLYCERIN IN D5W 200-5 MCG/ML-% IV SOLN
0.0000 ug/min | INTRAVENOUS | Status: DC
  Administered 2021-05-29: 70 ug/min via INTRAVENOUS
  Administered 2021-05-30: 80 ug/min via INTRAVENOUS
  Filled 2021-05-29 (×2): qty 250

## 2021-05-29 MED ORDER — CLOPIDOGREL BISULFATE 75 MG PO TABS
300.0000 mg | ORAL_TABLET | Freq: Once | ORAL | Status: AC
  Administered 2021-05-29: 300 mg via ORAL
  Filled 2021-05-29: qty 4

## 2021-05-29 MED ORDER — ISOSORBIDE MONONITRATE ER 60 MG PO TB24
60.0000 mg | ORAL_TABLET | Freq: Every day | ORAL | Status: DC
Start: 2021-05-29 — End: 2021-05-30
  Administered 2021-05-29: 60 mg via ORAL
  Filled 2021-05-29: qty 1

## 2021-05-29 NOTE — Progress Notes (Signed)
? ?Progress Note ? ?Patient Name: Darrell [name] ?Date of Encounter: 05/29/2021 ? ?[facility] Cardiologist: Minus Breeding, MD  ? ?Patient Profile  ?   ?66 y.o. male with a PMH of CAD s/p CABG in 2009 with subsequent PCI's, HTN, HLD, DM type II, CKD stage IIIb who is being seen 05/26/2021 for the evaluation of NSTEMI at the request of Dr. Roslynn Amble. ?  ? ?Assessment & Plan  ?  ?Principal Problem: ?  NSTEMI (non-ST elevated myocardial infarction) ([place]) ?Active Problems: ?  Coronary artery disease involving native coronary artery of native heart with unstable angina pectoris ([place]) ?  Hypertensive emergency ?  Ischemic cardiomyopathy ?  Diabetes mellitus type II, non insulin dependent ([place]) ?  Hyperlipidemia with target LDL less than 70 ?  history of VT arrest  ?  CKD (chronic kidney disease), stage III ([place]) ?  History of noncompliance with medical treatment, presenting hazards to health ?  Normocytic anemia ?  PNA (pneumonia) ? ?Diabetes mellitus type II, non insulin dependent ([place]) ?Well controlled, A1C of 5.2 ?Hold metformin while inpatient ?SSI and accuchecks qac/hs  ? ?history of VT arrest  ?Hx of VT arrest in 10/2020 ?Discussion with EP at that time of ICD placement; however, after discussion over risks/benefits patient declined and was continued on amiodarone which he does not appear to be taking on regular basis ?Has not followed up with cardiology since this time  ?Will continue telemetry/amiodarone.  ? ?CKD (chronic kidney disease), stage III ([place]) ?Baseline creatinine of 1.8-2.0 ?Stable, continue to monitor  ? ?Hypertensive emergency ?Hypertensive emergency in setting of possible medical noncompliance. Unsure how much this is contributing to NSTEMI ?Continue coreg and norvasc. Have room to titrate up his coreg if needed and heart rate allows ?On nitro drip for chest pain and will wean as tolerated ?PRN IV parameters will be placed  ? ?NSTEMI (non-ST elevated myocardial infarction) ([place]) ?66 year old with  known CAD s/p CABG with severe 3 vessel CAD presenting with chest pain, HTN emergency and NSTEMI ?-obs to progressive ?-ASA given with EMS ?-troponin [id number]. Trend to peak  ?-heparin gtt ?-nitro gtt for chest pain, wean as tolerated  ?-cardiology consulted with plans for [facility] tomorrow ?-echo pending  ?-continue statin, coreg and aspirin  ?-last LHC in 10/2020 with severe native 3 vessel CAD and following findings:  ?  Total occlusion of the LAD with severe diffuse disease beyond the LIMA insertion ? Total occlusion of the LCx ? Total occlusion of the RCA, distal branches collateralized by septal perforator branches of the LAD visualized on the  LIMA injections ?-no targets for PCI and recommended medical therapy  ? ?Right carotid bruit ?No hx of [place] with poor f/u ?Check carotid doppler--->80-99% right ICA stenosis and left 60-79% stenosis. Discussed with on call vascular and they recommended outpatient f/u, continued medical therapy.  ?Continue ASA/statin  ? ?Normocytic anemia ?Hx of anemia with hx of CKD ?Will check iron studies and monitor  ?Encourage colon cancer screening outpatient  ? ?NSTEMI (non-ST elevated myocardial infarction) ([place]) ?Presentation with significant ST changes with ST depressions in the setting of ongoing chest pain, notable troponin elevation consistent with non-STEMI presentation.  Troponins increased over 9000. ?-> Initiated on early conservative management with IV heparin and IV nitroglycerin with plans for cardiac catheterization on 06/24/2021, however the patient has chosen to consider continued conservative/noninvasive approach with medical management only.  Would prefer to avoid catheterization. ? ?Echocardiogram shows relatively stable findings with perhaps mildly reduced overall  function. ? ?Plan:  ?Complete 72 hours of IV heparin today. ->  We will DC this afternoon ?Convert from IV nitroglycerin to Imdur ?With ACS presentation, will convert from aspirin to Plavix for minimum of 6  months. ?Continue statin, Coreg, but increased Coreg to 12.5 mg twice daily. ?With concern of renal function, holding off on ACE inhibitor/[name]. => BP patellars, would potentially consider adding hydralazine for additional blood pressure/afterload reduction  ? ?Hypertensive emergency ?Significantly elevated blood pressure initially on arrival. ? ?Started back on Norvasc with increased dose of carvedilol.  Is now also on Imdur. ?I suspect that with nitroglycerin infusion stopping, his pressure will increase and will therefore likely consider addition of hydralazine for afterload reduction and blood pressure control.  However if stable, would simply continue with carvedilol and Norvas along with Imdur.   ?Echo does show grade 3 diastolic function.  Closely follow renal function. ? ?Coronary artery disease involving native coronary artery of native heart with unstable angina pectoris ([place]) ?Presented with unstable angina/non-STEMI.  He is opted for aggressive medical management, and to avoid cardiac catheterization. ? ?Known significant negative and graft CAD with patent SVG-OM and LIMA-LAD 2 to almost atretic LAD.  Native vessel occluded with SVG-RCA and SVG-diagonal both occluded.  Very unfavorable PCI options.  Essentially unrevealing notable decent distal vessel as the OM branch was grafted by the SVG. ? ?Patient has significant hematoma following last catheterization, and is indicated that he would not want another catheterization at this time. ? ?Plan: Wean off IV nitroglycerin and initiate Imdur.  Also DC IV heparin and convert to DOAC. ?On high-dose statin, Calcitrel blocker, beta-blocker and nitrate. ? ? ?Diabetes mellitus type II, non insulin dependent ([place]) ?Managed by [facility] hospitalist.  Metformin on hold.  Sliding scale, would be okay ? ?history of VT arrest  ?Intermittent shortness SVT on telemetry maintaining on current dose of amiodarone and carvedilol. ? ?Hyperlipidemia with target LDL less than 70 ?Last  LDL 105.  Question compliance with medical management. ? ?Continue high-dose atorvastatin.  Would probably benefit from PCSK9 either in the outpatient setting. ? ?CKD (chronic kidney disease), stage III ([place]) ?Stage IIIb.  Creatinine somewhat stable.  Right now not on ACE inhibitor or [name].  However with him having diabetes, would probably consider adding [name] prior to discharge due to CAD, cardiomyopathy, DM-2 and. ? ?Ischemic cardiomyopathy ?Previous EF was 45 to 50%.  Now 40 to 45%.  Modest reduction. ?Seems to be relatively euvolemic.  Therefore not on diuretic. ? ?Beta-blocker dose titrated up.  Continued on amlodipine. ?Consider [name]/ACE inhibitor in the outpatient setting. ?Consider SGLT2 inhibitor. ? ?History of noncompliance with medical treatment, presenting hazards to health ?This is a, recurrent issue.  He also has not followed up in the office routinely.  Given the concern for questionable adherence, I do think that a noninvasive option is probably the best choice because would not want him to be dependent on a Thienopyridine antiplatelet agent, which he could potentially not take. ? ? ? ?Subjective  ? ?Did fine today.  This has not really been very active as of yet.  Not yet ambulating in the hallway. ? ?Inpatient Medications  ?  ?Scheduled Meds: ? amiodarone  200 mg Oral Daily  ? amLODipine  10 mg Oral Daily  ? atorvastatin  80 mg Oral QPM  ? carvedilol  12.5 mg Oral BID WC  ? [START ON 05/30/2021] clopidogrel  75 mg Oral Daily  ? ferrous sulfate  325 mg Oral Q breakfast  ?  insulin aspart  0-9 Units Subcutaneous TID WC  ? isosorbide mononitrate  60 mg Oral Daily  ? sodium chloride flush  3 mL Intravenous Q12H  ? sodium chloride flush  3 mL Intravenous Q12H  ? ?Continuous Infusions: ? sodium chloride Stopped (05/30/2021 1245)  ? sodium chloride    ? sodium chloride    ? nitroGLYCERIN 70 mcg/min (05/29/21 1900)  ? ?PRN Meds: ?sodium chloride, sodium chloride, acetaminophen, metoprolol tartrate, morphine  injection, ondansetron (ZOFRAN) IV, sodium chloride flush, sodium chloride flush  ? ?Vital Signs  ?  ?Vitals:  ? 05/29/21 1904 05/29/21 1905 05/29/21 1938 05/29/21 2008  ?BP: 120/62 116/60 116/60 116/60  ?

## 2021-05-29 NOTE — Assessment & Plan Note (Addendum)
Previous EF was 45 to 50%.  Now 40 to 45%.  Modest reduction. ?After his event yesterday, now seems to be having worsening heart failure, potentially related to renal failure and lack of diuresis.  Did not respond to Lasix overnight at high dose and is still on Lasix drip. ? ?Follow-up echo showed stable EF. ? ?

## 2021-05-29 NOTE — Assessment & Plan Note (Addendum)
Continue high-dose statin.  If he survives this, would probably benefit from more aggressive therapy. ?

## 2021-05-29 NOTE — Assessment & Plan Note (Addendum)
Presentation with significant ST changes with ST depressions in the setting of ongoing chest pain, notable troponin elevation consistent with non-STEMI presentation.  Troponins increased over 9000. ?-> Initiated on early conservative management with IV heparin and IV nitroglycerin with plans for cardiac catheterization on 06/24/2021, however the patient has chosen to consider continued conservative/noninvasive approach with medical management only.  Would prefer to avoid catheterization. ? ?Echocardiogram shows relatively stable findings with perhaps mildly reduced overall function. ?Initially declined cardiac catheterization, therefore mutual agreement made to continue aggressive medical therapy. => Completed 72 hours of IV heparin. ->  Off IV nitroglycerin and converted to Imdur.  Restarting today. ? ?Unfortunately, initial plans for cardiac catheterization earlier this week did not happen because he declined catheterization indicating concerns about previous issues with hematoma.  He also indicated that in the past we were not able to intervene. ?Initially treated with 72 hours of IV heparin.  Unfortunately shortly after stopping IV heparin, he had an episode of VT leading to loss of consciousness and intubation.  Relatively quickly recovered from this but subsequently again had another run of monomorphic VT.  Very concerning for ischemic changes.  Interestingly, he is not having angina associated with this. ? ?Plan for now will be to continue medical management on heparin after the second run of VT and intubation.  Unfortunately with worsening renal function, unable to perform cath. ?

## 2021-05-29 NOTE — Assessment & Plan Note (Addendum)
Now progression of renal failure with creatinine increased from 2.13-3.57 today.  Follow-up checks now with a creatinine of 4.06. ? ?Plan this morning was to attempt trial of milrinone.  Minimal urine output on low-dose milrinone, dose titrated with still minimal urine output. ? ?Nephrology consulted.  Appreciate their input.  Probably not a long-term HD candidate.  Concern will be going on to a CVRR T and not be able to come off. ? ?Unfortunately, we need to determine whether or not he would be something that could be dialyzed if we are going to plan proceeding with catheterization for ischemic evaluation. ?

## 2021-05-29 NOTE — Progress Notes (Signed)
?PROGRESS NOTE ? ? ?Manus GunningSteven S Linderman  [id number] DOB: 1955/05/11 DOA: 05/25/2021 ?PCP: Hoy RegisterNewlin, Enobong, MD  ?Brief Narrative:  ?89 white male CABG 2009 multiple PCI's-no targets? ?VT arrest/PEA arrest 10 to 15 minutes requiring amnio eventually transition to the same-was recommended AICD and was lost to follow-up by cardiology ?Present ED 05/11/2021 CT-initially code STEMI however subsequently canceled ?Troponin peak 9000-cardiology seeing going for cath ?Also DM TY 2 CKD 3 HTN HLD ? ?Lives with wife at home-tells me he has been having chest pain for about 1 month on and off and decided to "do nothing about it"-describes NYHA class III-IV symptoms currently ? ?Hospital-Problem based course ? ?NSTEMI ?Hypertensive urgency on admission ?HLD ?Prior VT arrest with PEA last admission 2022 ?Per cardiology--patient declined cardiac cath on 5/1 because of large hematoma post cath last time he had it done--at that time medical therapy was recommended ?Meds: Amiodarone 200, Norvasc 10, Coreg now 12.5 twice daily, nitro gtt., as needed metoprolol injection, aspirin 81 ?R Carotid stenosis ??outpatient VVS input-unclear if candidate for procedure-we will message on call to ensure not lost to follow-up ?DM TY 2 CKD 3 ?CBG 140-200 ?Creatinine not usual baseline ?Tmax 100.4-denies cough cold dysuria diarrhea-monitor only ? ?DVT prophylaxis: Heparin GTT ?Code Status: Full ?Family Communication: None present-to be updated by Cardiologist after catheterization ?Disposition:  ?Status is: Observation ?The patient will require care spanning > 2 midnights and should be moved to inpatient because:  ? ? ?  ?Consultants:  ?Cardiology ? ?Procedures:  ? ?Declined cath 5/1 ? ? ?Antimicrobials: None ? ? ?Subjective: ? ?No chest pain fever chills nausea vomiting shortness of breath ? ?Objective: ?Vitals:  ? 05/29/21 0345 05/29/21 0437 05/29/21 0630 05/29/21 0747  ?BP:  133/66 133/66 125/60  ?Pulse:  78 79 84  ?Resp:  (!) 33 20 (!) 22  ?Temp:  100.3 ?F (37.9 ?C)  98.8 ?F (37.1 ?C) 98.7 ?F (37.1 ?C)  ?TempSrc: Oral  Oral Oral  ?SpO2:  96% 96% 98%  ?Weight:      ?Height:      ? ? ?Intake/Output Summary (Last 24 hours) at 05/29/2021 1020 ?Last data filed at 05/29/2021 0751 ?Gross per 24 hour  ?Intake 1425.39 ml  ?Output 400 ml  ?Net 1025.39 ml  ? ? ?Filed Weights  ? 05/05/2021 0838  ?Weight: 101.6 kg  ? ? ?Examination: ? ?Awake coherent slightly disheveled very thick beard cannot appreciate JVD ?Neck soft supple ?No icterus no pallor Mallampati 2 ?Does have some mild swelling of his right arm I think this is dependent edema ?S1-S2 no murmur no rub no gallop ?Abdomen is soft nontender no rebound no guarding ?Trace lower extremity edema ?Cannot appreciate liver or spleen ?Psych euthymic coherent ? ?Data Reviewed: personally reviewed  ? ?CBC ?   ?Component Value Date/Time  ? WBC 7.4 05/29/2021 0744  ? RBC 3.42 (L) 05/29/2021 0744  ? HGB 9.0 (L) 05/29/2021 0744  ? HCT 28.1 (L) 05/29/2021 0744  ? PLT 153 05/29/2021 0744  ? MCV 82.2 05/29/2021 0744  ? MCH 26.3 05/29/2021 0744  ? MCHC 32.0 05/29/2021 0744  ? RDW 18.2 (H) 05/29/2021 0744  ? LYMPHSABS 2.0 05/08/2021 0845  ? MONOABS 0.9 05/09/2021 0845  ? EOSABS 0.1 05/11/2021 0845  ? BASOSABS 0.0 05/06/2021 0845  ? ? ?  Latest Ref Rng & Units 06/19/2021  ?  4:12 AM 05/26/2021  ?  8:45 AM 11/02/2020  ?  1:26 AM  ?CMP  ?Glucose 70 - 99 mg/dL 956117  161   145    ?BUN 8 - 23 mg/dL 25   24   21     ?Creatinine 0.61 - 1.24 mg/dL 1.75   1.85   1.83    ?Sodium 135 - 145 mmol/L 134   136   136    ?Potassium 3.5 - 5.1 mmol/L 4.2   3.8   4.4    ?Chloride 98 - 111 mmol/L 103   105   106    ?CO2 22 - 32 mmol/L 23   20   23     ?Calcium 8.9 - 10.3 mg/dL 8.0   8.3   8.2    ?Total Protein 6.5 - 8.1 g/dL  6.8     ?Total Bilirubin 0.3 - 1.2 mg/dL  0.7     ?Alkaline Phos 38 - 126 U/L  87     ?AST 15 - 41 U/L  22     ?ALT 0 - 44 U/L  11     ? ? ? ?Radiology Studies: ?DG CHEST PORT 1 VIEW ? ?Result Date: 06/24/2021 ?CLINICAL DATA:  Pneumonia. EXAM:  PORTABLE CHEST 1 VIEW COMPARISON:  May 27, 2021. FINDINGS: Stable cardiomegaly. Sternotomy wires are noted. No acute pulmonary disease is noted. Bony thorax is unremarkable. IMPRESSION: No active disease. Electronically Signed   By: Marijo Conception M.D.   On: 06/26/2021 17:40  ? ?ECHOCARDIOGRAM COMPLETE ? ?Result Date: 06/19/2021 ?   ECHOCARDIOGRAM REPORT   Patient Name:   TYRELLE GUDAITIS Date of Exam: 06/12/2021 Medical Rec #:  [id number]         Height:       73.0 in Accession #:    [id number]        Weight:       224.0 lb Date of Birth:  11-12-1955         BSA:          2.258 m? Patient Age:    66 years          BP:           145/69 mmHg Patient Gender: M                 HR:           80 bpm. Exam Location:  Inpatient Procedure: 2D Echo, Color Doppler and Cardiac Doppler Indications:    R07.9* Chest pain, unspecified  History:        Patient has prior history of Echocardiogram examinations, most                 recent 10/16/2020. Prior CABG; Risk Factors:Hypertension,                 Diabetes and Dyslipidemia.  Sonographer:    Raquel Sarna Senior RDCS Referring Phys: [id number] [place]  1. Left ventricular ejection fraction, by estimation, is 45 to 50%. The left ventricle has mildly decreased function. The left ventricle demonstrates regional wall motion abnormalities (see scoring diagram/findings for description). There is moderate concentric left ventricular hypertrophy. Left ventricular diastolic parameters are consistent with Grade III diastolic dysfunction (restrictive). There is akinesis of the left ventricular, apical anterior segment. There is mild hypokinesis of the left ventricular, basal inferior wall and inferolateral wall.  2. Right ventricular systolic function is normal. The right ventricular size is normal.  3. Left atrial size was severely dilated.  4. Right atrial size was mildly dilated.  5. The mitral valve is grossly normal. Mild to moderate  mitral valve regurgitation. No evidence of  mitral stenosis.  6. The aortic valve is tricuspid. There is mild calcification of the aortic valve. There is mild thickening of the aortic valve. Aortic valve regurgitation is mild to moderate. Aortic valve sclerosis is present, with no evidence of aortic valve stenosis. Aortic regurgitation PHT measures 239 msec.  7. Aortic dilatation noted. There is mild dilatation of the ascending aorta, measuring 41 mm.  8. The inferior vena cava is normal in size with <50% respiratory variability, suggesting right atrial pressure of 8 mmHg. Comparison(s): No significant change from prior study. Visually appears similar to prior. FINDINGS  Left Ventricle: Left ventricular ejection fraction, by estimation, is 45 to 50%. The left ventricle has mildly decreased function. The left ventricle demonstrates regional wall motion abnormalities. Mild hypokinesis of the left ventricular, basal inferior wall and inferolateral wall. The left ventricular internal cavity size was normal in size. There is moderate concentric left ventricular hypertrophy. Left ventricular diastolic parameters are consistent with Grade III diastolic dysfunction (restrictive). Right Ventricle: The right ventricular size is normal. No increase in right ventricular wall thickness. Right ventricular systolic function is normal. Left Atrium: Left atrial size was severely dilated. Right Atrium: Right atrial size was mildly dilated. Pericardium: There is no evidence of pericardial effusion. Mitral Valve: The mitral valve is grossly normal. There is mild thickening of the mitral valve leaflet(s). There is mild calcification of the mitral valve leaflet(s). Mild to moderate mitral annular calcification. Mild to moderate mitral valve regurgitation. No evidence of mitral valve stenosis. Tricuspid Valve: The tricuspid valve is grossly normal. Tricuspid valve regurgitation is trivial. No evidence of tricuspid stenosis. Aortic Valve: The aortic valve is tricuspid. There is mild  calcification of the aortic valve. There is mild thickening of the aortic valve. Aortic valve regurgitation is mild to moderate. Aortic regurgitation PHT measures 239 msec. Aortic valve sclerosis  is pre

## 2021-05-29 NOTE — Progress Notes (Signed)
ANTICOAGULATION CONSULT NOTE ? ?Pharmacy Consult for heparin ?Indication: chest pain/ACS ? ?No Known Allergies ? ?Patient Measurements: ?Height: 6\' 1"  (185.4 cm) ?Weight: 101.6 kg (224 lb) ?IBW/kg (Calculated) : 79.9 ?Heparin Dosing Weight: 100 kg  ? ?Vital Signs: ?Temp: 98.7 ?F (37.1 ?C) (05/02 0747) ?Temp Source: Oral (05/02 0747) ?BP: 125/60 (05/02 0747) ?Pulse Rate: 84 (05/02 0747) ? ?Labs: ?Recent Labs  ?  04/30/2021 ?4193 05/04/2021 ?1028 05/09/2021 ?1433 05/06/2021 ?1624 04/29/2021 ?1624 05/15/2021 ?1753 06/07/2021 ?7902 06/13/2021 ?1251 06/08/2021 ?2207 05/29/21 ?4097  ?HGB 11.6*  --   --   --   --   --  10.0*  --   --  9.0*  ?HCT 37.9*  --   --   --   --   --  31.0*  --   --  28.1*  ?PLT 221  --   --   --   --   --  173  --   --  153  ?APTT 37*  --   --   --   --   --   --   --   --   --   ?LABPROT 14.8  --   --   --   --   --   --   --   --   --   ?INR 1.2  --   --   --   --   --   --   --   --   --   ?HEPARINUNFRC  --   --   --  0.32   < >  --  0.29* 0.14* 0.28* 0.49  ?CREATININE 1.85*  --   --   --   --   --  1.75*  --   --   --   ?TROPONINIHS 1,515*   < > 3,964* 7,835*  --  9,195*  --   --   --   --   ? < > = values in this interval not displayed.  ?Estimated Creatinine Clearance: 53.4 mL/min (A) (by C-G formula based on SCr of 1.75 mg/dL (H)). ? ?Medical History: ?Past Medical History:  ?Diagnosis Date  ? CKD (chronic kidney disease), stage III (HCC)   ? Claudication [facility])   ? Coronary artery disease   ? a. 08/2001 s/p DES to RCA/RI;  b. 07/2006 s/p DES to p/d LCX;  c. 11/2007 CABGx5 (LIMA->LAD, VG->D1, LRA->OM1, VG->PDA->LPL); c. 03/2015: Synergy DES to mid-Cx in the setting of NSTEMI; d. 11/2015 NSTEMI->Med Rx. e. NSTEMI 01/2018 -> med rx.  ? Diabetes mellitus type 2 in nonobese [facility])   ? a. A1c 7.0 09/2012.  ? Diastolic dysfunction   ? a. 02/2016 Echo: EF 50-55%, gr1DD, Ao sclerosis, dil Ao root (74mm), sev dil LA, triv TR, PASP .  ? Hyperlipidemia   ? Hypertension   ? Noncompliance   ? Right carotid bruit   ? a.  noted 01/2018, will need OP eval when patient complies with follow-up.  ? ?  ? ?Assessment: ?37 YOM with chest pain and elevated troponin concerning for ACS. No anticoagulation prior to admission. Pharmacy consulted for heparin.   ? ?Heparin level 0.49 is therapeutic on 1700 units/hr. H/H slightly down, plt stable.  No issues with infusion or bleeding per RN. Will decrease infusion slightly to keep in goal. ? ?  ?Goal of Therapy:  ?Heparin level 0.3-0.7 units/ml ?Monitor platelets by anticoagulation protocol: Yes ?  ?Plan:  ?Decrease Heparin infusion to 1650 units/hr.   ?Monitor  daily Heparin level, CBC and s/s of bleeding  ? ?Benetta Spar, PharmD, BCPS, BCCP ?Clinical Pharmacist ? ?Please check AMION for all [place] phone numbers ?After 10:00 PM, call [place] [phone] ? ?

## 2021-05-29 NOTE — Assessment & Plan Note (Addendum)
Presented with unstable angina/non-STEMI.  He is opted for aggressive medical management, and to avoid cardiac catheterization. ? ?Known significant negative and graft CAD with patent SVG-OM and LIMA-LAD 2 to almost atretic LAD.  Native vessel occluded with SVG-RCA and SVG-diagonal both occluded.  Very unfavorable PCI options.  Essentially unrevealing notable decent distal vessel as the OM branch was grafted by the SVG. ?Patient initially declined cardiac catheterization.  Now he is interested in doing so.  Unfortunately renal function is an issue.  See below. ?Unfortunately, he is now suffered 2 separate episodes of symptomatic ventricular tachycardia-last night a prolonged episode leading to loss of pulse and intubation.  He had ROSC after CPR without shock. ?- Monomorphic VT - quite likely Ischemic VT -- would like to assess with Cath, and he would be agreeable => unfortunately, with worsening renal function, this plan has been delayed.   ?Plan:  ?On aspirin and Plavix. ?High-dose statin. ? ?Unfortunately, now with worsening renal function, cath is prohibitive risk.  Would like to see if there could be some return renal function. ?Would continue on IV heparin for now until his renal fxn trend is determined.  ? ?

## 2021-05-29 NOTE — Assessment & Plan Note (Addendum)
Significantly elevated blood pressure initially on arrival. ? ?Now stabilized. ?On carvedilol 12.5 mg daily along with hydralazine/nitrate. ? ?Not on high-dose amlodipine-for microvascular ischemia, we may want to restart once stabilized. ? ?Using hydralazine/nitrate combination as opposed to [name]/ACE-I due to worsening renal function (in setting of stage IIIb CKD.Marland Kitchen) ?

## 2021-05-29 NOTE — Assessment & Plan Note (Signed)
Managed by [facility] hospitalist.  Metformin on hold.  Sliding scale, would be okay ?

## 2021-05-29 NOTE — Assessment & Plan Note (Addendum)
In the past had VT arrest, but not described as torsades.   ?Has now had a second run of VT with the last night's event being much more prominent.  Is now back on IV amiodarone drip along with IV lidocaine.  We are reducing the dose of lidocaine to 1/h.  Continue to monitor levels.  EP was consulted.  Currently not an ICD candidate, nor but he potentially be. ?For now continue amiodarone and lidocaine.  EP will determine timing of discontinuation of lidocaine. ? ?Ultimately needs an ischemic evaluation, but unfortunately renal function is worsening.  Unless the the patient would be a candidate for long-term dialysis, would not recommend catheterization at this point. ? ?

## 2021-05-29 NOTE — Assessment & Plan Note (Addendum)
This is a, recurrent issue.  He also has not followed up in the office routinely.  Given the concern for questionable adherence, I do think that a noninvasive option is probably the best choice because would not want him to be dependent on a Thienopyridine antiplatelet agent, which he could potentially not take. ?Similar concerns about lipid management. ?

## 2021-05-30 ENCOUNTER — Inpatient Hospital Stay (HOSPITAL_COMMUNITY): Payer: Medicare Other | Admitting: Anesthesiology

## 2021-05-30 ENCOUNTER — Inpatient Hospital Stay (HOSPITAL_COMMUNITY): Payer: Medicare Other

## 2021-05-30 DIAGNOSIS — R579 Shock, unspecified: Secondary | ICD-10-CM

## 2021-05-30 DIAGNOSIS — N1832 Chronic kidney disease, stage 3b: Secondary | ICD-10-CM

## 2021-05-30 DIAGNOSIS — E1122 Type 2 diabetes mellitus with diabetic chronic kidney disease: Secondary | ICD-10-CM

## 2021-05-30 DIAGNOSIS — I25110 Atherosclerotic heart disease of native coronary artery with unstable angina pectoris: Secondary | ICD-10-CM

## 2021-05-30 DIAGNOSIS — I214 Non-ST elevation (NSTEMI) myocardial infarction: Secondary | ICD-10-CM | POA: Diagnosis not present

## 2021-05-30 DIAGNOSIS — I472 Ventricular tachycardia, unspecified: Secondary | ICD-10-CM | POA: Diagnosis not present

## 2021-05-30 DIAGNOSIS — E119 Type 2 diabetes mellitus without complications: Secondary | ICD-10-CM

## 2021-05-30 DIAGNOSIS — I129 Hypertensive chronic kidney disease with stage 1 through stage 4 chronic kidney disease, or unspecified chronic kidney disease: Secondary | ICD-10-CM

## 2021-05-30 DIAGNOSIS — N183 Chronic kidney disease, stage 3 unspecified: Secondary | ICD-10-CM

## 2021-05-30 LAB — POCT I-STAT 7, (LYTES, BLD GAS, ICA,H+H)
Acid-base deficit: 5 mmol/L — ABNORMAL HIGH (ref 0.0–2.0)
Bicarbonate: 17.8 mmol/L — ABNORMAL LOW (ref 20.0–28.0)
Calcium, Ion: 1.07 mmol/L — ABNORMAL LOW (ref 1.15–1.40)
HCT: 25 % — ABNORMAL LOW (ref 39.0–52.0)
Hemoglobin: 8.5 g/dL — ABNORMAL LOW (ref 13.0–17.0)
O2 Saturation: 100 %
Patient temperature: 99.8
Potassium: 4.3 mmol/L (ref 3.5–5.1)
Sodium: 133 mmol/L — ABNORMAL LOW (ref 135–145)
TCO2: 18 mmol/L — ABNORMAL LOW (ref 22–32)
pCO2 arterial: 24.9 mmHg — ABNORMAL LOW (ref 32–48)
pH, Arterial: 7.465 — ABNORMAL HIGH (ref 7.35–7.45)
pO2, Arterial: 298 mmHg — ABNORMAL HIGH (ref 83–108)

## 2021-05-30 LAB — BASIC METABOLIC PANEL
Anion gap: 8 (ref 5–15)
Anion gap: 10 (ref 5–15)
BUN: 36 mg/dL — ABNORMAL HIGH (ref 8–23)
BUN: 39 mg/dL — ABNORMAL HIGH (ref 8–23)
CO2: 20 mmol/L — ABNORMAL LOW (ref 22–32)
CO2: 19 mmol/L — ABNORMAL LOW (ref 22–32)
Calcium: 7.9 mg/dL — ABNORMAL LOW (ref 8.9–10.3)
Calcium: 8.1 mg/dL — ABNORMAL LOW (ref 8.9–10.3)
Chloride: 105 mmol/L (ref 98–111)
Chloride: 104 mmol/L (ref 98–111)
Creatinine, Ser: 2.33 mg/dL — ABNORMAL HIGH (ref 0.61–1.24)
Creatinine, Ser: 2.64 mg/dL — ABNORMAL HIGH (ref 0.61–1.24)
GFR, Estimated: 30 mL/min — ABNORMAL LOW (ref >60)
GFR, Estimated: 26 mL/min — ABNORMAL LOW (ref >60)
Glucose, Bld: 186 mg/dL — ABNORMAL HIGH (ref 70–99)
Glucose, Bld: 145 mg/dL — ABNORMAL HIGH (ref 70–99)
Potassium: 4.1 mmol/L (ref 3.5–5.1)
Potassium: 4.9 mmol/L (ref 3.5–5.1)
Sodium: 133 mmol/L — ABNORMAL LOW (ref 135–145)
Sodium: 133 mmol/L — ABNORMAL LOW (ref 135–145)

## 2021-05-30 LAB — COMPREHENSIVE METABOLIC PANEL
ALT: 11 U/L (ref 0–44)
AST: 25 U/L (ref 15–41)
Albumin: 2.4 g/dL — ABNORMAL LOW (ref 3.5–5.0)
Alkaline Phosphatase: 61 U/L (ref 38–126)
Anion gap: 14 (ref 5–15)
BUN: 33 mg/dL — ABNORMAL HIGH (ref 8–23)
CO2: 16 mmol/L — ABNORMAL LOW (ref 22–32)
Calcium: 8.4 mg/dL — ABNORMAL LOW (ref 8.9–10.3)
Chloride: 103 mmol/L (ref 98–111)
Creatinine, Ser: 2.13 mg/dL — ABNORMAL HIGH (ref 0.61–1.24)
GFR, Estimated: 34 mL/min — ABNORMAL LOW (ref >60)
Glucose, Bld: 135 mg/dL — ABNORMAL HIGH (ref 70–99)
Potassium: 4.8 mmol/L (ref 3.5–5.1)
Sodium: 133 mmol/L — ABNORMAL LOW (ref 135–145)
Total Bilirubin: 1 mg/dL (ref 0.3–1.2)
Total Protein: 6 g/dL — ABNORMAL LOW (ref 6.5–8.1)

## 2021-05-30 LAB — CBC
HCT: 28.3 % — ABNORMAL LOW (ref 39.0–52.0)
HCT: 29.7 % — ABNORMAL LOW (ref 39.0–52.0)
Hemoglobin: 8.9 g/dL — ABNORMAL LOW (ref 13.0–17.0)
Hemoglobin: 9.5 g/dL — ABNORMAL LOW (ref 13.0–17.0)
MCH: 26 pg (ref 26.0–34.0)
MCH: 26.5 pg (ref 26.0–34.0)
MCHC: 31.4 g/dL (ref 30.0–36.0)
MCHC: 32 g/dL (ref 30.0–36.0)
MCV: 82.7 fL (ref 80.0–100.0)
MCV: 82.7 fL (ref 80.0–100.0)
Platelets: 157 10*3/uL (ref 150–400)
Platelets: 169 10*3/uL (ref 150–400)
RBC: 3.42 MIL/uL — ABNORMAL LOW (ref 4.22–5.81)
RBC: 3.59 MIL/uL — ABNORMAL LOW (ref 4.22–5.81)
RDW: 18.4 % — ABNORMAL HIGH (ref 11.5–15.5)
RDW: 18.4 % — ABNORMAL HIGH (ref 11.5–15.5)
WBC: 8.2 10*3/uL (ref 4.0–10.5)
WBC: 8.6 10*3/uL (ref 4.0–10.5)
nRBC: 0 % (ref 0.0–0.2)
nRBC: 0 % (ref 0.0–0.2)

## 2021-05-30 LAB — PHOSPHORUS: Phosphorus: 3.6 mg/dL (ref 2.5–4.6)

## 2021-05-30 LAB — GLUCOSE, CAPILLARY
Glucose-Capillary: 133 mg/dL — ABNORMAL HIGH (ref 70–99)
Glucose-Capillary: 227 mg/dL — ABNORMAL HIGH (ref 70–99)
Glucose-Capillary: 176 mg/dL — ABNORMAL HIGH (ref 70–99)
Glucose-Capillary: 181 mg/dL — ABNORMAL HIGH (ref 70–99)
Glucose-Capillary: 130 mg/dL — ABNORMAL HIGH (ref 70–99)
Glucose-Capillary: 168 mg/dL — ABNORMAL HIGH (ref 70–99)

## 2021-05-30 LAB — URINE CULTURE: Culture: 60000 — AB

## 2021-05-30 LAB — LACTIC ACID, PLASMA
Lactic Acid, Venous: 3.1 mmol/L (ref 0.5–1.9)
Lactic Acid, Venous: 1.2 mmol/L (ref 0.5–1.9)

## 2021-05-30 LAB — MAGNESIUM
Magnesium: 2 mg/dL (ref 1.7–2.4)
Magnesium: 2.3 mg/dL (ref 1.7–2.4)

## 2021-05-30 LAB — TROPONIN I (HIGH SENSITIVITY): Troponin I (High Sensitivity): 5164 ng/L (ref <18)

## 2021-05-30 LAB — HEPARIN LEVEL (UNFRACTIONATED): Heparin Unfractionated: <0.1 IU/mL — ABNORMAL LOW (ref 0.30–0.70)

## 2021-05-30 LAB — MRSA NEXT GEN BY PCR, NASAL: MRSA by PCR Next Gen: NOT DETECTED

## 2021-05-30 MED ORDER — SODIUM CHLORIDE 0.9 % IV SOLN
3.0000 g | Freq: Four times a day (QID) | INTRAVENOUS | Status: DC
  Administered 2021-05-30 – 2021-05-31 (×6): 3 g via INTRAVENOUS
  Filled 2021-05-30 (×9): qty 8

## 2021-05-30 MED ORDER — HYDRALAZINE HCL 25 MG PO TABS
25.0000 mg | ORAL_TABLET | Freq: Three times a day (TID) | ORAL | Status: DC
  Administered 2021-05-30 – 2021-05-31 (×4): 25 mg via ORAL
  Filled 2021-05-30 (×4): qty 1

## 2021-05-30 MED ORDER — MIDAZOLAM-SODIUM CHLORIDE 100-0.9 MG/100ML-% IV SOLN: 0.5000 mg/h | INTRAVENOUS | Status: DC

## 2021-05-30 MED ORDER — FUROSEMIDE 10 MG/ML IJ SOLN
40.0000 mg | Freq: Once | INTRAMUSCULAR | Status: AC
  Administered 2021-05-30: 40 mg via INTRAVENOUS
  Filled 2021-05-30: qty 4

## 2021-05-30 MED ORDER — SUCCINYLCHOLINE CHLORIDE 200 MG/10ML IV SOSY
PREFILLED_SYRINGE | INTRAVENOUS | Status: DC | PRN
  Administered 2021-05-30: 200 mg via INTRAVENOUS

## 2021-05-30 MED ORDER — AMIODARONE HCL 200 MG PO TABS
200.0000 mg | ORAL_TABLET | Freq: Every day | ORAL | Status: DC
  Administered 2021-05-31: 200 mg via ORAL
  Filled 2021-05-30: qty 1

## 2021-05-30 MED ORDER — MIDAZOLAM HCL 2 MG/2ML IJ SOLN
INTRAMUSCULAR | Status: AC
  Administered 2021-05-30: 2 mg
  Filled 2021-05-30: qty 2

## 2021-05-30 MED ORDER — FENTANYL CITRATE PF 50 MCG/ML IJ SOSY
PREFILLED_SYRINGE | INTRAMUSCULAR | Status: AC
  Administered 2021-05-30 (×2): 50 ug
  Filled 2021-05-30: qty 2

## 2021-05-30 MED ORDER — SODIUM CHLORIDE 0.9 % IV BOLUS
500.0000 mL | Freq: Once | INTRAVENOUS | Status: AC
  Administered 2021-05-30: 500 mL via INTRAVENOUS

## 2021-05-30 MED ORDER — VASOPRESSIN 20 UNITS/100 ML INFUSION FOR SHOCK
0.0000 [IU]/min | INTRAVENOUS | Status: DC
  Filled 2021-05-30: qty 100

## 2021-05-30 MED ORDER — AMIODARONE LOAD VIA INFUSION
150.0000 mg | Freq: Once | INTRAVENOUS | Status: DC
  Filled 2021-05-30: qty 83.34

## 2021-05-30 MED ORDER — FUROSEMIDE 40 MG PO TABS
40.0000 mg | ORAL_TABLET | Freq: Every day | ORAL | Status: DC
  Administered 2021-05-31: 40 mg via ORAL
  Filled 2021-05-30: qty 1

## 2021-05-30 MED ORDER — ORAL CARE MOUTH RINSE
15.0000 mL | OROMUCOSAL | Status: DC
  Administered 2021-05-30 (×5): 15 mL via OROMUCOSAL

## 2021-05-30 MED ORDER — AMIODARONE IV BOLUS ONLY 150 MG/100ML
150.0000 mg | Freq: Once | INTRAVENOUS | Status: AC
  Administered 2021-05-30: 150 mg via INTRAVENOUS
  Filled 2021-05-30: qty 100

## 2021-05-30 MED ORDER — HEPARIN SODIUM (PORCINE) 5000 UNIT/ML IJ SOLN
5000.0000 [IU] | Freq: Three times a day (TID) | INTRAMUSCULAR | Status: DC
  Administered 2021-05-30 – 2021-05-31 (×4): 5000 [IU] via SUBCUTANEOUS
  Filled 2021-05-30 (×4): qty 1

## 2021-05-30 MED ORDER — MIDAZOLAM HCL 2 MG/2ML IJ SOLN
2.0000 mg | Freq: Once | INTRAMUSCULAR | Status: AC
  Administered 2021-05-30: 2 mg via INTRAVENOUS
  Filled 2021-05-30: qty 2

## 2021-05-30 MED ORDER — FENTANYL 2500MCG IN NS 250ML (10MCG/ML) PREMIX INFUSION
0.0000 ug/h | INTRAVENOUS | Status: DC
  Administered 2021-05-30: 100 ug/h via INTRAVENOUS
  Filled 2021-05-30: qty 250

## 2021-05-30 MED ORDER — ETOMIDATE 2 MG/ML IV SOLN
INTRAVENOUS | Status: DC | PRN
  Administered 2021-05-30: 20 mg via INTRAVENOUS

## 2021-05-30 MED ORDER — POLYETHYLENE GLYCOL 3350 17 G PO PACK
17.0000 g | PACK | Freq: Every day | ORAL | Status: DC
  Administered 2021-05-30 – 2021-05-31 (×2): 17 g via ORAL
  Filled 2021-05-30 (×2): qty 1

## 2021-05-30 MED ORDER — NITROGLYCERIN IN D5W 200-5 MCG/ML-% IV SOLN: 0.0000 ug/min | INTRAVENOUS | Status: DC

## 2021-05-30 MED ORDER — AMIODARONE HCL IN DEXTROSE 360-4.14 MG/200ML-% IV SOLN
30.0000 mg/h | INTRAVENOUS | Status: DC
  Administered 2021-05-30 – 2021-05-31 (×3): 30 mg/h via INTRAVENOUS
  Filled 2021-05-30 (×3): qty 200

## 2021-05-30 MED ORDER — CALCIUM GLUCONATE-NACL 1-0.675 GM/50ML-% IV SOLN
1.0000 g | Freq: Once | INTRAVENOUS | Status: AC
  Administered 2021-05-30: 1000 mg via INTRAVENOUS
  Filled 2021-05-30: qty 50

## 2021-05-30 MED ORDER — CHLORHEXIDINE GLUCONATE 0.12% ORAL RINSE (MEDLINE KIT)
15.0000 mL | Freq: Two times a day (BID) | OROMUCOSAL | Status: DC
  Administered 2021-05-30 – 2021-05-31 (×4): 15 mL via OROMUCOSAL

## 2021-05-30 MED ORDER — AMIODARONE HCL IN DEXTROSE 360-4.14 MG/200ML-% IV SOLN
60.0000 mg/h | INTRAVENOUS | Status: DC
  Administered 2021-05-30: 60 mg/h via INTRAVENOUS

## 2021-05-30 MED ORDER — NOREPINEPHRINE 4 MG/250ML-% IV SOLN
0.0000 ug/min | INTRAVENOUS | Status: DC
  Administered 2021-05-30: 10 ug/min via INTRAVENOUS
  Filled 2021-05-30 (×2): qty 250

## 2021-05-30 MED ORDER — SENNA 8.6 MG PO TABS
1.0000 | ORAL_TABLET | Freq: Every day | ORAL | Status: DC
  Administered 2021-05-30 – 2021-05-31 (×2): 8.6 mg via ORAL
  Filled 2021-05-30 (×2): qty 1

## 2021-05-30 MED ORDER — CHLORHEXIDINE GLUCONATE CLOTH 2 % EX PADS
6.0000 | MEDICATED_PAD | Freq: Every day | CUTANEOUS | Status: DC
  Administered 2021-05-30 – 2021-06-02 (×5): 6 via TOPICAL

## 2021-05-30 NOTE — Progress Notes (Signed)
ETT advanced 3cm per Dr. Marcos Eke. ETT secured at 28 at the lips. ?

## 2021-05-30 NOTE — Progress Notes (Signed)
?   05/30/21 0529  ?Clinical Encounter Type  ?Visited With Patient not available  ?Visit Type Initial;Code  ?Referral From Nurse  ?Consult/Referral To Chaplain  ? ?[place] responded. The patient was being attended to by the medical team. There is no support person present. Provided ministry of calm presence and staff support. Chaplain remains available for additional support as needed. This note was prepared by Jeanine Luz, M.Div..  For questions please contact by phone [phone].   ?

## 2021-05-30 NOTE — Progress Notes (Signed)
This nurse received a call from the monitor tech that the patient had a run of Torsades. Went into the patient's room and inquired how he is feeling. Patient complained of 6/10 chest pain and difficulty breathing. Patient at this time is diaphoretic. Monitor tech called this nurse and informed me that the patient just had a run of ventricular tachycardia. Patient verbalized that he is not feeling good and started shaking like he was having a seizure and lost consciousness. This nurse pressed on the distress button and another came in to pull the code blue button. Code blue team came in and started life saving interventions. Anesthesia intubated the patient and started him on pressors. This nurse gave report to the nurse at 2 Heart and transported patient to critical care.  ?

## 2021-05-30 NOTE — Progress Notes (Signed)
?  X-cover Note: ?Called by bedside by Code [employer] team. Pt with intermittent V-fib. Did not lose a pulse.  CRNA at bedside to intubate pt. Pt given 150 mg IV amiodarone.  Pt will be transferred to CICU. ? ?Pt has been seen by cardiology. Per cardiology notes "-no targets for PCI and recommended medical therapy" per Dr. Elissa Hefty note. ? ?Carollee Herter, DO ?[facility] ? ?

## 2021-05-30 NOTE — Progress Notes (Signed)
Pt transported to 2H 20 from 6E 28 w/o event.  Pt transported on vent. ?

## 2021-05-30 NOTE — H&P (Incomplete)
? ?NAME:  Darrell [name], MRN:  [id number], DOB:  1955/08/18, LOS: 1 ?ADMISSION DATE:  05/05/2021, CONSULTATION DATE:  05/30/21 ?REFERRING MD:  Alfonse Spruce, CHIEF COMPLAINT:  chest pain  ? ?History of Present Illness:  ? ?66 yo man with hx of CABG 2009 with subsequent PCIs, , CAD,  CKD 3, DM2, HTN, HLD, here with chest pain and NSTEMI, admitted on 4/30.  Medical management, patient refused cath due to prior complications.  ? ?This am developed torsades.  Never lost pulse.  Was agitated and frequently developing arrhythmia, so was intubated for airway protection.  ?Pertinent  Medical History  ? ? CKD (chronic kidney disease), stage III ([place])    ? Claudication [facility])    ? Coronary artery disease    ?  a. 08/2001 s/p DES to RCA/RI;  b. 07/2006 s/p DES to p/d LCX;  c. 11/2007 CABGx5 (LIMA->LAD, VG->D1, LRA->OM1, VG->PDA->LPL); c. 03/2015: Synergy DES to mid-Cx in the setting of NSTEMI; d. 11/2015 NSTEMI->Med Rx. e. NSTEMI 01/2018 -> med rx.  ? Diabetes mellitus type 2 in nonobese [facility]    ?  a. A1c 7.0 0000000.  ? Diastolic dysfunction    ?  a. 02/2016 Echo: EF 50-55%, gr1DD, Ao sclerosis, dil Ao root (78mm), sev dil LA, triv TR, PASP 12mmHg.  ? Hyperlipidemia    ? Hypertension    ? Noncompliance    ? Right carotid bruit    ?  a. noted 01/2018, will need OP eval when patient complies with follow-up.  ?  ? ?Significant Hospital Events: ?Including procedures, antibiotic start and stop dates in addition to other pertinent events   ? ? ?Interim History / Subjective:  ?*** ? ?Objective   ?Blood pressure 138/69, pulse 86, temperature 99.9 ?F (37.7 ?C), temperature source Oral, resp. rate (!) 29, height 6\' 1"  (1.854 m), weight 101.6 kg, SpO2 93 %. ?   ?   ? ?Intake/Output Summary (Last 24 hours) at 05/30/2021 0557 ?Last data filed at 05/30/2021 0437 ?Gross per 24 hour  ?Intake 530 ml  ?Output 485 ml  ?Net 45 ml  ? ?Filed Weights  ? 05/16/2021 0838  ?Weight: 101.6 kg  ? ? ?Examination: ?General: *** ?HENT: *** ?Lungs: *** ?Cardiovascular:  *** ?Abdomen: *** ?Extremities: *** ?Neuro: *** ?GU: *** ? ?Resolved Hospital Problem list   ?*** ? ?Assessment & Plan:  ?*** ? ?Best Practice (right click and "Reselect all SmartList Selections" daily)  ? ?Diet/type: {diet type:25684} ?DVT prophylaxis: {anticoagulation (Optional):25687} ?GI prophylaxis: [id number] ?Lines: {Central Venous Access:25771} ?Foley:  {Central Venous Access:25691} ?Code Status:  {Code Status:26939} ?Last date of multidisciplinary goals of care discussion [***] ? ?Labs   ?CBC: ?Recent Labs  ?Lab 05/06/2021 ?[id number] 06/20/2021 ?[id number] 05/29/21 ?[id number] 05/30/21 ?[id number]  ?WBC 10.0 9.6 7.4 8.2  ?NEUTROABS 7.0  --   --   --   ?HGB 11.6* 10.0* 9.0* 8.9*  ?HCT 37.9* 31.0* 28.1* 28.3*  ?MCV 84.6 80.9 82.2 82.7  ?PLT 221 173 153 157  ? ? ?Basic Metabolic Panel: ?Recent Labs  ?Lab 05/24/2021 ?[id number] 06/17/2021 ?[id number]  ?NA 136 134*  ?K 3.8 4.2  ?CL 105 103  ?CO2 20* 23  ?GLUCOSE 161* 117*  ?BUN 24* 25*  ?CREATININE 1.85* 1.75*  ?CALCIUM 8.3* 8.0*  ? ?GFR: ?Estimated Creatinine Clearance: 53.4 mL/min (A) (by C-G formula based on SCr of 1.75 mg/dL (H)). ?Recent Labs  ?Lab 05/01/2021 ?[id number] 06/23/2021 ?[id number] 05/29/21 ?[id number] 05/30/21 ?[id number]  ?WBC 10.0 9.6 7.4 8.2  ? ? ?Liver Function Tests: ?Recent  Labs  ?Lab 05/19/2021 ?0845  ?AST 22  ?ALT 11  ?ALKPHOS 87  ?BILITOT 0.7  ?PROT 6.8  ?ALBUMIN 3.2*  ? ?No results for input(s): LIPASE, AMYLASE in the last 168 hours. ?No results for input(s): AMMONIA in the last 168 hours. ? ?ABG ?   ?Component Value Date/Time  ? PHART 7.563 (H) 10/16/2020 1228  ? PCO2ART 30.9 (L) 10/16/2020 1228  ? PO2ART 438 (H) 10/16/2020 1228  ? HCO3 27.9 10/16/2020 1228  ? TCO2 29 10/16/2020 1228  ? ACIDBASEDEF 5.1 (H) 10/15/2020 1810  ? O2SAT 100.0 10/16/2020 1228  ?  ? ?Coagulation Profile: ?Recent Labs  ?Lab 05/19/2021 ?0845  ?INR 1.2  ? ? ?Cardiac Enzymes: ?No results for input(s): CKTOTAL, CKMB, CKMBINDEX, TROPONINI in the last 168 hours. ? ?HbA1C: ?Hgb A1c MFr Bld  ?Date/Time Value Ref Range Status  ?05/03/2021  08:45 AM 5.2 4.8 - 5.6 % Final  ?  Comment:  ?  (NOTE) ?Pre diabetes:          5.7%-6.4% ? ?Diabetes:              >6.4% ? ?Glycemic control for   <7.0% ?adults with diabetes ?  ?09/03/2020 03:25 PM 5.6 4.8 - 5.6 % Final  ?  Comment:  ?  (NOTE) ?Pre diabetes:          5.7%-6.4% ? ?Diabetes:              >6.4% ? ?Glycemic control for   <7.0% ?adults with diabetes ?  ? ? ?CBG: ?Recent Labs  ?Lab 06/12/2021 ?2208 05/29/21 ?0750 05/29/21 ?1200 05/29/21 ?1621 05/29/21 ?2108  ?GLUCAP 138* 195* 140* 110* 126*  ? ? ?Review of Systems:   ?*** ? ?Past Medical History:  ?He,  has a past medical history of CKD (chronic kidney disease), stage III ([place]), Claudication ([place]), Coronary artery disease, Diabetes mellitus type 2 in nonobese ([place]), Diastolic dysfunction, Hyperlipidemia, Hypertension, Noncompliance, and Right carotid bruit.  ? ?Surgical History:  ? ?Past Surgical History:  ?Procedure Laterality Date  ? CARDIAC CATHETERIZATION N/A 04/06/2015  ? Procedure: Left Heart Cath and Cors/Grafts Angiography;  Surgeon: Lorretta Harp, MD;  Location: [place] CV LAB;  Service: Cardiovascular;  Laterality: N/A;  ? CARDIAC CATHETERIZATION N/A 04/06/2015  ? Procedure: Coronary Stent Intervention;  Surgeon: Lorretta Harp, MD;  2.5 mm x 12 mm long Synergy DES followed by  2.5 mm x 16 mm long Synergy DES    ? CORONARY ARTERY BYPASS GRAFT  2009  ? 2009 LIMA to LAD, SVG to Diag, SVG to PDA and PL, left radial to OM  ? CORONARY BALLOON ANGIOPLASTY N/A 05/04/2018  ? Procedure: CORONARY BALLOON ANGIOPLASTY;  Surgeon: Lorretta Harp, MD;  Location: [place] CV LAB;  Service: Cardiovascular;  Laterality: N/A;  Prox CFX  ? LEFT HEART CATH N/A 10/24/2012  ? Procedure: LEFT HEART CATH;  Surgeon: Troy Sine, MD;  Location: [facility] CATH LAB;  Service: Cardiovascular;  Laterality: N/A;  ? LEFT HEART CATH AND CORS/GRAFTS ANGIOGRAPHY N/A 05/04/2018  ? Procedure: LEFT HEART CATH AND CORS/GRAFTS ANGIOGRAPHY;  Surgeon: Lorretta Harp, MD;   Location: [place] CV LAB;  Service: Cardiovascular;  Laterality: N/A;  ? LEFT HEART CATH AND CORS/GRAFTS ANGIOGRAPHY N/A 11/01/2020  ? Procedure: LEFT HEART CATH AND CORS/GRAFTS ANGIOGRAPHY;  Surgeon: Sherren Mocha, MD;  Location: [place] CV LAB;  Service: Cardiovascular;  Laterality: N/A;  ?  ? ?Social History:  ? reports that he has never smoked. He  has never used smokeless tobacco. He reports that he does not drink alcohol and does not use drugs.  ? ?Family History:  ?His family history includes CAD (age of onset: 1) in his mother; Cancer in his mother; Heart failure in his father and mother; Kidney failure in his brother.  ? ?Allergies ?No Known Allergies  ? ?Home Medications  ?Prior to Admission medications   ?Medication Sig Start Date End Date Taking? Authorizing Provider  ?amiodarone (PACERONE) 200 MG tablet Take 1 tablet (200 mg total) by mouth daily for 5 days. 12/29/20 05/08/2021 Yes Minus Breeding, MD  ?amLODipine (NORVASC) 10 MG tablet Take 1 tablet (10 mg total) by mouth daily. Please make PCP appointment. 04/16/21  Yes Minus Breeding, MD  ?aspirin 81 MG EC tablet Take 1 tablet (81 mg total) by mouth daily. Swallow whole. 11/02/20  Yes Jose Persia, MD  ?atorvastatin (LIPITOR) 80 MG tablet Take 1 tablet (80 mg total) by mouth every evening. 04/16/21  Yes Minus Breeding, MD  ?carvedilol (COREG) 6.25 MG tablet Take 1 tablet (6.25 mg total) by mouth 2 (two) times daily with a meal. 04/16/21  Yes Leonie Man, MD  ?metFORMIN (GLUCOPHAGE) 500 MG tablet Take 1 tablet (500 mg total) by mouth daily with breakfast. 11/02/20 01/06/21  Jose Persia, MD  ?nitroGLYCERIN (NITROSTAT) 0.4 MG SL tablet PLACE 1 TABLET UNDER THE TONGUE EVERY 5 MINUTES AS NEEDED FOR CHEST PAIN (UP TO 3 DOSES. IF TAKING 3RD DOSE CALL 911) ?Patient not taking: Reported on 05/20/2021 01/24/21 01/24/22  [name], MD  ?  ? ?Critical care time: *** ?  ? ? ? ? ? ?

## 2021-05-30 NOTE — Progress Notes (Signed)
Dr. Herbie Baltimore notified of patient's low UOP throughout shift. Dr. Herbie Baltimore ordered BMP and 500 fluid bolus. Will re-evaluate post interventions ?

## 2021-05-30 NOTE — Procedures (Signed)
Extubation Procedure Note ? ?Patient Details:   ?Name: Darrell [name] ?DOB: 29-Feb-1955 ?MRN: [id number] ?  ?Airway Documentation:  ?  ?Vent end date: 05/30/21 Vent end time: 1105  ? ?Evaluation ? O2 sats: stable throughout ?Complications: No apparent complications ?Patient did tolerate procedure well. ?Bilateral Breath Sounds: Clear, Diminished ?  ?Yes, pt could speak post extubation.  Pt extubated to 4 l/m [place] per physician's order. ? ?Audrie Lia ?05/30/2021, 11:06 AM ? ?

## 2021-05-30 NOTE — Code Documentation (Signed)
CODE BLUE activated at 5: 30 AM.  When seen at bedside, patient appeared in respiratory distress.  He went in and out of V-fib with highest rates of 170.  He lost consciousness during those episode but never lost a pulse. ? ?After the second time that he went to V-fib, CRNA intubated the patient for airway protection and anticipation of cardioversion.  Patient verbalized permission for intubation.  Amiodarone 150 mg bolus was given as well.  We have spoken to the primary team and the cardiology team.  They agreed with the plan. ? ?Patient with history of CAD status post CABG 2019 who was admitted for NSTEMI.  Per cardiology, patient declined for heart cath.  He was started on heparin drip, which was subtherapeutic. ? ?Patient will be transferred to the ICU for management of ventilator.  Family has been updated. ?

## 2021-05-30 NOTE — Procedures (Signed)
Arterial Catheter Insertion Procedure Note ? ?Darrell [name]  ?[id number]  ?[birth year] ? ?Date:05/30/21  ?Time:7:01 AM  ? ? ?Provider Performing: Inez Pilgrim  ? ? ?Procedure: Insertion of Arterial Line (76283) without US guidance ? ?Indication(s) ?Blood pressure monitoring and/or need for frequent ABGs ? ?Consent ?Unable to obtain consent due to inability to find a medical decision maker for patient.  All reasonable efforts were made.  Another independent medical provider,   , confirmed the benefits of this procedure outweigh the risks. ? ?Anesthesia ?None ? ? ?Time Out ?Verified patient identification, verified procedure, site/side was marked, verified correct patient position, special equipment/implants available, medications/allergies/relevant history reviewed, required imaging and test results available. ? ? ?Sterile Technique ?Maximal sterile technique including full sterile barrier drape, hand hygiene, sterile gown, sterile gloves, mask, hair covering, sterile ultrasound probe cover (if used). ? ? ?Procedure Description ?Area of catheter insertion was cleaned with chlorhexidine and draped in sterile fashion. Without real-time ultrasound guidance an arterial catheter was placed into the right radial artery.  Appropriate arterial tracings confirmed on monitor.   ? ? ?Complications/Tolerance ?None; patient tolerated the procedure well. ? ? ?EBL ?Minimal ? ? ?Specimen(s) ?None ? ?

## 2021-05-30 NOTE — Consult Note (Signed)
? ?NAME:  Darrell [name], MRN:  [id number], DOB:  1955/05/19, LOS: 1 ?ADMISSION DATE:  05/17/2021, CONSULTATION DATE: 05/30/2021 ?REFERRING MD: Rapid response team, CHIEF COMPLAINT: S/p torsade ? ?History of Present Illness:  ?66 year old male with coronary artery disease s/p CABG, prior history of VT arrest who initially presented with chest pain, noted to have acute non-ST elevation MI, medically managed as patient refused cardiac catheterization. ?On 5/3 in early morning hours patient was noted to go into ventricular tachycardia and then torsade, he became confused, rapid response was called, he was given magnesium, IV amiodarone, he was intubated and was transferred to ICU.  PCCM was consulted for help evaluation and management ? ?Pertinent  Medical History  ? ?Past Medical History:  ?Diagnosis Date  ? CKD (chronic kidney disease), stage III ([place])   ? Claudication [facility])   ? Coronary artery disease   ? a. 08/2001 s/p DES to RCA/RI;  b. 07/2006 s/p DES to p/d LCX;  c. 11/2007 CABGx5 (LIMA->LAD, VG->D1, LRA->OM1, VG->PDA->LPL); c. 03/2015: Synergy DES to mid-Cx in the setting of NSTEMI; d. 11/2015 NSTEMI->Med Rx. e. NSTEMI 01/2018 -> med rx.  ? Diabetes mellitus type 2 in nonobese [facility])   ? a. A1c 7.0 0000000.  ? Diastolic dysfunction   ? a. 02/2016 Echo: EF 50-55%, gr1DD, Ao sclerosis, dil Ao root (71mm), sev dil LA, triv TR, PASP 41mmHg.  ? Hyperlipidemia   ? Hypertension   ? Noncompliance   ? Right carotid bruit   ? a. noted 01/2018, will need OP eval when patient complies with follow-up.  ? ? ? ?Significant Hospital Events: ?Including procedures, antibiotic start and stop dates in addition to other pertinent events   ? ? ?Interim History / Subjective:  ? ? ?Objective   ?Blood pressure (!) 112/54, pulse 63, temperature 97.6 ?F (36.4 ?C), temperature source Oral, resp. rate (!) 27, height 6\' 1"  (1.854 m), weight 101.6 kg, SpO2 96 %. ?CVP:  [12 mmHg] 12 mmHg  ?Vent Mode: PSV;PRVC ?FiO2 (%):  [36 %-100 %] 36 % ?Set Rate:   [18 bmp-24 bmp] 18 bmp ?Vt Set:  [id number] mL] 640 mL ?PEEP:  [5 cmH20] 5 cmH20 ?Pressure Support:  [8 cmH20-10 cmH20] 8 cmH20 ?Plateau Pressure:  [16 cmH20-22 cmH20] 22 cmH20  ? ?Intake/Output Summary (Last 24 hours) at 05/30/2021 1357 ?Last data filed at 05/30/2021 1200 ?Gross per 24 hour  ?Intake 1061.74 ml  ?Output 335 ml  ?Net 726.74 ml  ? ?Filed Weights  ? 05/20/2021 0838  ?Weight: 101.6 kg  ? ? ?Examination: ?  ?Physical exam: ?General: Crtitically ill-appearing male, orally intubated ?HEENT: [place]/AT, eyes anicteric.  ETT and OGT in place ?Neuro: Opens eyes with vocal stimuli, intermittently following few commands ?Chest: Coarse breath sounds, no wheezes or rhonchi ?Heart: Regular rate and rhythm, no murmurs or gallops ?Abdomen: Soft, nontender, nondistended, bowel sounds present ?Skin: No rash ? ?Resolved Hospital Problem list   ?Hypertensive emergency ? ?Assessment & Plan:  ?Acute non-ST elevation MI ?Coronary artery disease s/p CABG ?Recurrent ventricular tachycardia ?Torsades ?Diabetes type 2 ?AKI CKD stage IIIb ?Right carotid stenosis ?Anemia of chronic disease ?Chronic HFrEF due to ischemic cardiomyopathy ?Hyponatremia ?Acute hypoxic respiratory failure due to aspiration pneumonia ? ?Completed therapy with IV heparin for 48 hours ?Patient refused cardiac catheterization ?Continue Plavix and atorvastatin ?Rebolused with amiodarone ?Continue IV amiodarone infusion ?Cardiology is following ?Monitor electrolytes and supplement, keep magnesium >2 and potassium >4 ?Continue with sliding scale insulin with CBG goal 140-180 ?Hemoglobin A1c is 5.2 ?Serum  creatinine continue to trend up ?Continue gentle IV diuresis ?Monitor intake and output ?Avoid nephrotoxic agents ?H&H remained stable ?Restarted back on hydralazine and Imdur ?Monitor electrolyte ?Patient is tolerating spontaneous breathing trial, will try to extubate him ?Follow-up respiratory culture ?Continue IV Unasyn ? ? ? ?Best Practice (right click and "Reselect all  SmartList Selections" daily)  ? ?Diet/type: NPO ?DVT prophylaxis: prophylactic heparin  ?GI prophylaxis: N/A ?Lines: Central line ?Foley:  N/A ?Code Status:  full code ?Last date of multidisciplinary goals of care discussion [pending] ? ?Labs   ?CBC: ?Recent Labs  ?Lab 05/15/2021 ?[id number] 06/24/2021 ?[id number] 05/29/21 ?[id number] 05/30/21 ?[id number] 05/30/21 ?0530 05/30/21 ?[id number]  ?WBC 10.0 9.6 7.4 8.2 8.6  --   ?NEUTROABS 7.0  --   --   --   --   --   ?HGB 11.6* 10.0* 9.0* 8.9* 9.5* 8.5*  ?HCT 37.9* 31.0* 28.1* 28.3* 29.7* 25.0*  ?MCV 84.6 80.9 82.2 82.7 82.7  --   ?PLT 221 173 153 157 169  --   ? ? ?Basic Metabolic Panel: ?Recent Labs  ?Lab 05/20/2021 ?[id number] 06/01/2021 ?[id number] 05/30/21 ?0530 05/30/21 ?[id number] 05/30/21 ?1044  ?NA 136 134* 133* 133* 133*  ?K 3.8 4.2 4.8 4.3 4.1  ?CL 105 103 103  --  105  ?CO2 20* 23 16*  --  20*  ?GLUCOSE 161* 117* 135*  --  186*  ?BUN 24* 25* 33*  --  36*  ?CREATININE 1.85* 1.75* 2.13*  --  2.33*  ?CALCIUM 8.3* 8.0* 8.4*  --  7.9*  ?MG  --   --  2.0  --  2.3  ?PHOS  --   --  3.6  --   --   ? ?GFR: ?Estimated Creatinine Clearance: 40.1 mL/min (A) (by C-G formula based on SCr of 2.33 mg/dL (H)). ?Recent Labs  ?Lab 05/30/2021 ?0412 05/29/21 ?[id number] 05/30/21 ?0242 05/30/21 ?0530 05/30/21 ?1044  ?WBC 9.6 7.4 8.2 8.6  --   ?LATICACIDVEN  --   --   --  3.1* 1.2  ? ? ?Liver Function Tests: ?Recent Labs  ?Lab 05/16/2021 ?[id number] 05/30/21 ?0530  ?AST 22 25  ?ALT 11 11  ?ALKPHOS 87 61  ?BILITOT 0.7 1.0  ?PROT 6.8 6.0*  ?ALBUMIN 3.2* 2.4*  ? ?No results for input(s): LIPASE, AMYLASE in the last 168 hours. ?No results for input(s): AMMONIA in the last 168 hours. ? ?ABG ?   ?Component Value Date/Time  ? PHART 7.465 (H) 05/30/2021 [id number]  ? PCO2ART 24.9 (L) 05/30/2021 [id number]  ? PO2ART 298 (H) 05/30/2021 [id number]  ? HCO3 17.8 (L) 05/30/2021 0651  ? TCO2 18 (L) 05/30/2021 0651  ? ACIDBASEDEF 5.0 (H) 05/30/2021 [id number]  ? O2SAT 100 05/30/2021 0651  ?  ? ?Coagulation Profile: ?Recent Labs  ?Lab 05/08/2021 ?0845  ?INR 1.2  ? ? ?Cardiac Enzymes: ?No  results for input(s): CKTOTAL, CKMB, CKMBINDEX, TROPONINI in the last 168 hours. ? ?HbA1C: ?Hgb A1c MFr Bld  ?Date/Time Value Ref Range Status  ?05/26/2021 08:45 AM 5.2 4.8 - 5.6 % Final  ?  Comment:  ?  (NOTE) ?Pre diabetes:          5.7%-6.4% ? ?Diabetes:              >6.4% ? ?Glycemic control for   <7.0% ?adults with diabetes ?  ?09/03/2020 03:25 PM 5.6 4.8 - 5.6 % Final  ?  Comment:  ?  (NOTE) ?Pre diabetes:          5.7%-6.4% ? ?Diabetes:              >  6.4% ? ?Glycemic control for   <7.0% ?adults with diabetes ?  ? ? ?CBG: ?Recent Labs  ?Lab 05/29/21 ?2108 05/30/21 ?0530 05/30/21 ?0902 05/30/21 ?1052 05/30/21 ?1121  ?GLUCAP 126* 133* 227* 181* 176*  ? ? ?Review of Systems:   ?Unable to obtain as patient is intubated ? ?Past Medical History:  ?He,  has a past medical history of CKD (chronic kidney disease), stage III ([place]), Claudication ([place]), Coronary artery disease, Diabetes mellitus type 2 in nonobese ([place]), Diastolic dysfunction, Hyperlipidemia, Hypertension, Noncompliance, and Right carotid bruit.  ? ?Surgical History:  ? ?Past Surgical History:  ?Procedure Laterality Date  ? CARDIAC CATHETERIZATION N/A 04/06/2015  ? Procedure: Left Heart Cath and Cors/Grafts Angiography;  Surgeon: Lorretta Harp, MD;  Location: [place] CV LAB;  Service: Cardiovascular;  Laterality: N/A;  ? CARDIAC CATHETERIZATION N/A 04/06/2015  ? Procedure: Coronary Stent Intervention;  Surgeon: Lorretta Harp, MD;  2.5 mm x 12 mm long Synergy DES followed by  2.5 mm x 16 mm long Synergy DES    ? CORONARY ARTERY BYPASS GRAFT  2009  ? 2009 LIMA to LAD, SVG to Diag, SVG to PDA and PL, left radial to OM  ? CORONARY BALLOON ANGIOPLASTY N/A 05/04/2018  ? Procedure: CORONARY BALLOON ANGIOPLASTY;  Surgeon: Lorretta Harp, MD;  Location: [place] CV LAB;  Service: Cardiovascular;  Laterality: N/A;  Prox CFX  ? LEFT HEART CATH N/A 10/24/2012  ? Procedure: LEFT HEART CATH;  Surgeon: Troy Sine, MD;  Location: [facility] CATH LAB;   Service: Cardiovascular;  Laterality: N/A;  ? LEFT HEART CATH AND CORS/GRAFTS ANGIOGRAPHY N/A 05/04/2018  ? Procedure: LEFT HEART CATH AND CORS/GRAFTS ANGIOGRAPHY;  Surgeon: Lorretta Harp, MD;  Location: [facility]

## 2021-05-30 NOTE — Progress Notes (Signed)
Patient arrive to unit. 97/45 (62). CHG delivered. Patient extremely agitated. Elink notified of patient arrival. OG tube and art line inserted. Awaiting CVC placement. Mitts applied. ?

## 2021-05-30 NOTE — Plan of Care (Signed)

## 2021-05-30 NOTE — Progress Notes (Signed)
eLink Physician-Brief Progress Note ?Patient Name: Darrell [name] ?DOB: 1955-05-01 ?MRN: [id number] ? ? ?Date of Service ? 05/30/2021  ?HPI/Events of Note ? ABG and electrolyte results reviewed. Patient's blood pressure soft on high dose Norepinephrine gtt.  ?eICU Interventions ? Vasopressin gtt added, K+ and Ca++  repleted.  ? ? ? ?Intervention Category ?Evaluation Type: New Patient Evaluation ? ?Thomasene Lot Carleah Yablonski ?05/30/2021, 6:53 AM ?

## 2021-05-30 NOTE — Transfer of Care (Signed)
Immediate Anesthesia Transfer of Care Note ? ?Patient: Darrell [name] ? ?Procedure(s) Performed: AN AD HOC INTUBATION ? ?Patient Location: Nursing Unit ? ?Anesthesia Type:General ? ?Level of Consciousness: Patient remains intubated per anesthesia plan ? ?Airway & Oxygen Therapy: Patient remains intubated per anesthesia plan and Patient placed on Ventilator (see vital sign flow sheet for setting) ? ?Post-op Assessment: Report given to RN and Post -op Vital signs reviewed and stable ? ?Post vital signs: Reviewed and stable ? ?Last Vitals:  ?Vitals Value Taken Time  ?BP 87/49 05/30/21 0602  ?Temp    ?Pulse 71 05/30/21 0602  ?Resp 24 05/30/21 0602  ?SpO2 97 % 05/30/21 0602  ?Vitals shown include unvalidated device data. ? ?Last Pain:  ?Vitals:  ? 05/30/21 0440  ?TempSrc: Oral  ?PainSc:   ?   ? ?Patients Stated Pain Goal: 0 (05/29/21 2008) ? ?Complications: No notable events documented. ?

## 2021-05-30 NOTE — Procedures (Addendum)
Central Venous Catheter Insertion Procedure Note ? ?[name]  ?[id number]  ?August 03, 1955 ? ?Date:05/30/21  ?Time:8:04 AM  ? ?Provider Performing:Aryiah Monterosso  ? ?Procedure: Insertion of Non-tunneled Central Venous Catheter([id number]) with US guidance (36644)  ? ?Indication(s) ?Medication administration ? ?Consent ?Risks of the procedure as well as the alternatives and risks of each were explained to the patient and/or caregiver.  Consent for the procedure was obtained and is signed in the bedside chart ? ?Anesthesia ?Topical only with 1% lidocaine  ? ?Timeout ?Verified patient identification, verified procedure, site/side was marked, verified correct patient position, special equipment/implants available, medications/allergies/relevant history reviewed, required imaging and test results available. ? ?Sterile Technique ?Maximal sterile technique including full sterile barrier drape, hand hygiene, sterile gown, sterile gloves, mask, hair covering, sterile ultrasound probe cover (if used). ? ?Procedure Description ?Area of catheter insertion was cleaned with chlorhexidine and draped in sterile fashion.  With real-time ultrasound guidance a central venous catheter was placed into the left subclavian vein. Nonpulsatile blood flow and easy flushing noted in all ports.  The catheter was sutured in place and sterile dressing applied. ? ?Complications/Tolerance ?None; patient tolerated the procedure well. ?Chest X-ray is ordered to verify placement for internal jugular or subclavian cannulation.   Chest x-ray is not ordered for femoral cannulation. ? ?EBL ?Minimal ? ?Specimen(s) ?None ? ? ? ?

## 2021-05-30 NOTE — Anesthesia Procedure Notes (Signed)
Procedure Name: Intubation ?Date/Time: 05/30/2021 5:50 AM ?Performed by: Clovis Cao, CRNA ?Pre-anesthesia Checklist: Patient identified, Emergency Drugs available, Suction available and Patient being monitored ?Patient Re-evaluated:Patient Re-evaluated prior to induction ?Oxygen Delivery Method: Ambu bag ?Preoxygenation: Pre-oxygenation with 100% oxygen ?Induction Type: IV induction, Rapid sequence and Cricoid Pressure applied ?Laryngoscope Size: Glidescope and 4 ?Grade View: Grade I ?Tube type: Oral ?Tube size: 7.5 mm ?Number of attempts: 1 ?Airway Equipment and Method: Stylet and Video-laryngoscopy ?Placement Confirmation: ETT inserted through vocal cords under direct vision, breath sounds checked- equal and bilateral and CO2 detector ?Secured at: 23 cm ?Tube secured with: Tape ?Dental Injury: Teeth and Oropharynx as per pre-operative assessment  ? ? ? ? ?

## 2021-05-30 NOTE — Code Documentation (Signed)
Called to code blue. Supportive emergency measures in process. Pt in intermittent runs of [place] with a pulse. Skin pale and clammy. No defibrillations.  ?-2gm Mag given ?-150mg  Amio given ?-Intubated per anesthesia.  ?-Fentanyl 50 mcg x 2 given after intubation for sedation ?-Levophed gtt initiated. Currently infusing at 20 mcg at time of transport.  ? ? ?

## 2021-05-30 NOTE — Progress Notes (Signed)
eLink Physician-Brief Progress Note ?Patient Name: Darrell [name] ?DOB: February 29, 1955 ?MRN: [id number] ? ? ?Date of Service ? 05/30/2021  ?HPI/Events of Note ? Patient with coronary artery disease s/p STEMI who was intubated and transferred to the ICU earlier today for the treatment of Torsade VT.  ?eICU Interventions ? New Patient Evaluation.  ? ? ? ?  ? ?[name] ?05/30/2021, 6:47 AM ?

## 2021-05-30 NOTE — Progress Notes (Signed)
Pharmacy Antibiotic Note ? ?Darrell [name] is a 66 y.o. male admitted on 05/09/2021 with aspiration pneumonia.  Pharmacy has been consulted for Unasyn dosing. ? ?WBC 8.6, Scr 2.13, no fever noted. ? ?Plan: ?Unasyn 3 g IV q6h x5 days ?Monitor renal function for dose adjustments if needed ? ?Height: 6\' 1"  (185.4 cm) ?Weight: 101.6 kg (224 lb) ?IBW/kg (Calculated) : 79.9 ? ?Temp (24hrs), Avg:99.3 ?F (37.4 ?C), Min:98.7 ?F (37.1 ?C), Max:99.9 ?F (37.7 ?C) ? ?Recent Labs  ?Lab 05/05/2021 ?05/29/21 05/29/2021 ?07/28/21 05/29/21 ?07/29/21 05/30/21 ?07/30/21 05/30/21 ?0530  ?WBC 10.0 9.6 7.4 8.2 8.6  ?CREATININE 1.85* 1.75*  --   --  2.13*  ?LATICACIDVEN  --   --   --   --  3.1*  ?  ?Estimated Creatinine Clearance: 43.9 mL/min (A) (by C-G formula based on SCr of 2.13 mg/dL (H)).   ? ?No Known Allergies ? ?Antimicrobials this admission: ?Unasyn 5/3 >> 5/8 ? ?Dose adjustments this admission: ?N/A ? ?Microbiology results: ?5/1 BCx: ngtd ?5/1 UCx: 60,000 colonies/ml E. coli  ?5/3 Sputum: in process ?5/3 MRSA PCR: not detected ? ?Thank you for allowing pharmacy to be a part of this patient?s care. ? ?7/3, PharmD ?PGY1 Acute Care Pharmacy Resident  ?Phone: [phone] ?05/30/2021  10:26 AM ? ?Please check [email or web address] for unit-specific pharmacy phone numbers. ? ? ?

## 2021-05-30 NOTE — Progress Notes (Signed)
[facility] signoff note ? ? ?The care of this patient has been transferred from the [facility] service to the following service: ? ?Service: PCCM ?Attending: Dr. Cheri Fowler. I have discussed with Dr. Merrily Pew ?Date & time: 05/30/2021, 8:48 AM ? ?Chart very briefly reviewed.  Early this morning, CODE BLUE activated due to V-fib, loss of consciousness but never lost a pulse.  Intubated.  PCCM consulted and patient transferred to ICU.  Care taken over by the [facility] team.  I personally communicated with Dr. Merrily Pew. ? ?Please re consult the [facility] team for any further assistance. ? ?Thank you ? ? ?[employer]. MD ?[facility] ? ? ?To contact a [facility] provider, please log into the web site [email or web address] and access using universal [place] password for that web site. If you do not have the password, please call the hospital operator. Page "[facility] Admits & Consults" listed on top of the page. Provide a number where you can be directly reached. ? ?

## 2021-05-30 NOTE — Progress Notes (Signed)
? ? ?  Patient transferred to ICU overnight.  Somewhat confusing story.  Apparently had a run of torsades and questionable loss of consciousness.  Was intubated.  Ended up becoming hypotensive requiring Levophed therefore his antihypertensives were held. ? ?He is now normotensive off pressors with plans to extubate. ? ?Patient has known history of VTE.  We will check electrolytes to ensure that there are supplemented-potassium magnesium appear to be normal. ? ?We will rebolus amiodarone IV and run overnight.  Then restart p.o. tomorrow. ?Restart hydralazine at lower dose along with nitrate\ ?Chest x-ray with some evidence of pulmonary edema, will give 1 dose of IV Lasix and start p.o. Lasix tomorrow. ? ? ?Full note to follow. ? ? ?Bryan Lemma, MD ? ?

## 2021-05-31 ENCOUNTER — Other Ambulatory Visit (HOSPITAL_COMMUNITY): Payer: Self-pay

## 2021-05-31 DIAGNOSIS — N1832 Chronic kidney disease, stage 3b: Secondary | ICD-10-CM | POA: Diagnosis not present

## 2021-05-31 DIAGNOSIS — I249 Acute ischemic heart disease, unspecified: Secondary | ICD-10-CM

## 2021-05-31 DIAGNOSIS — I4721 Torsades de pointes: Secondary | ICD-10-CM

## 2021-05-31 DIAGNOSIS — I214 Non-ST elevation (NSTEMI) myocardial infarction: Secondary | ICD-10-CM | POA: Diagnosis not present

## 2021-05-31 LAB — COMPREHENSIVE METABOLIC PANEL
ALT: 131 U/L — ABNORMAL HIGH (ref 0–44)
AST: 90 U/L — ABNORMAL HIGH (ref 15–41)
Albumin: 2.3 g/dL — ABNORMAL LOW (ref 3.5–5.0)
Alkaline Phosphatase: 112 U/L (ref 38–126)
Anion gap: 11 (ref 5–15)
BUN: 50 mg/dL — ABNORMAL HIGH (ref 8–23)
CO2: 20 mmol/L — ABNORMAL LOW (ref 22–32)
Calcium: 7.6 mg/dL — ABNORMAL LOW (ref 8.9–10.3)
Chloride: 102 mmol/L (ref 98–111)
Creatinine, Ser: 3.57 mg/dL — ABNORMAL HIGH (ref 0.61–1.24)
GFR, Estimated: 18 mL/min — ABNORMAL LOW (ref >60)
Glucose, Bld: 209 mg/dL — ABNORMAL HIGH (ref 70–99)
Potassium: 4.1 mmol/L (ref 3.5–5.1)
Sodium: 133 mmol/L — ABNORMAL LOW (ref 135–145)
Total Bilirubin: 0.8 mg/dL (ref 0.3–1.2)
Total Protein: 5.8 g/dL — ABNORMAL LOW (ref 6.5–8.1)

## 2021-05-31 LAB — CBC
HCT: 28.8 % — ABNORMAL LOW (ref 39.0–52.0)
Hemoglobin: 8.9 g/dL — ABNORMAL LOW (ref 13.0–17.0)
MCH: 26.3 pg (ref 26.0–34.0)
MCHC: 30.9 g/dL (ref 30.0–36.0)
MCV: 85 fL (ref 80.0–100.0)
Platelets: 164 10*3/uL (ref 150–400)
RBC: 3.39 MIL/uL — ABNORMAL LOW (ref 4.22–5.81)
RDW: 19.2 % — ABNORMAL HIGH (ref 11.5–15.5)
WBC: 8 10*3/uL (ref 4.0–10.5)
nRBC: 0.5 % — ABNORMAL HIGH (ref 0.0–0.2)

## 2021-05-31 LAB — BASIC METABOLIC PANEL
Anion gap: 12 (ref 5–15)
BUN: 42 mg/dL — ABNORMAL HIGH (ref 8–23)
CO2: 17 mmol/L — ABNORMAL LOW (ref 22–32)
Calcium: 7.7 mg/dL — ABNORMAL LOW (ref 8.9–10.3)
Chloride: 103 mmol/L (ref 98–111)
Creatinine, Ser: 2.94 mg/dL — ABNORMAL HIGH (ref 0.61–1.24)
GFR, Estimated: 23 mL/min — ABNORMAL LOW (ref >60)
Glucose, Bld: 141 mg/dL — ABNORMAL HIGH (ref 70–99)
Potassium: 4.3 mmol/L (ref 3.5–5.1)
Sodium: 132 mmol/L — ABNORMAL LOW (ref 135–145)

## 2021-05-31 LAB — MAGNESIUM
Magnesium: 2.2 mg/dL (ref 1.7–2.4)
Magnesium: 2.3 mg/dL (ref 1.7–2.4)

## 2021-05-31 LAB — GLUCOSE, CAPILLARY
Glucose-Capillary: 146 mg/dL — ABNORMAL HIGH (ref 70–99)
Glucose-Capillary: 154 mg/dL — ABNORMAL HIGH (ref 70–99)
Glucose-Capillary: 175 mg/dL — ABNORMAL HIGH (ref 70–99)
Glucose-Capillary: 199 mg/dL — ABNORMAL HIGH (ref 70–99)

## 2021-05-31 LAB — PHOSPHORUS: Phosphorus: 5.8 mg/dL — ABNORMAL HIGH (ref 2.5–4.6)

## 2021-05-31 LAB — TROPONIN I (HIGH SENSITIVITY)
Troponin I (High Sensitivity): 3599 ng/L (ref <18)
Troponin I (High Sensitivity): 3738 ng/L (ref <18)

## 2021-05-31 MED ORDER — DOPAMINE-DEXTROSE 3.2-5 MG/ML-% IV SOLN
0.0000 ug/kg/min | INTRAVENOUS | Status: DC
  Administered 2021-05-31: 5 ug/kg/min via INTRAVENOUS

## 2021-05-31 MED ORDER — AMIODARONE LOAD VIA INFUSION
150.0000 mg | Freq: Once | INTRAVENOUS | Status: AC
  Administered 2021-05-31: 150 mg via INTRAVENOUS
  Filled 2021-05-31: qty 83.34

## 2021-05-31 MED ORDER — DOPAMINE-DEXTROSE 3.2-5 MG/ML-% IV SOLN
INTRAVENOUS | Status: AC
  Filled 2021-05-31: qty 250

## 2021-05-31 MED ORDER — AMIODARONE HCL IN DEXTROSE 360-4.14 MG/200ML-% IV SOLN
INTRAVENOUS | Status: AC
Start: 2021-05-31 — End: 2021-05-31
  Administered 2021-05-31: 60 mg/h via INTRAVENOUS
  Filled 2021-05-31: qty 400

## 2021-05-31 MED ORDER — AMIODARONE HCL IN DEXTROSE 360-4.14 MG/200ML-% IV SOLN: 30.0000 mg/h | INTRAVENOUS | Status: DC

## 2021-05-31 MED ORDER — NOREPINEPHRINE 4 MG/250ML-% IV SOLN
0.0000 ug/min | INTRAVENOUS | Status: DC
  Administered 2021-05-31: 2 ug/min via INTRAVENOUS
  Administered 2021-06-02: 1 ug/min via INTRAVENOUS
  Filled 2021-05-31 (×2): qty 250

## 2021-05-31 MED ORDER — ETOMIDATE 2 MG/ML IV SOLN
INTRAVENOUS | Status: AC
  Administered 2021-06-01: 15 mg via INTRAVENOUS
  Filled 2021-05-31: qty 20

## 2021-05-31 MED ORDER — AMIODARONE HCL 200 MG PO TABS
400.0000 mg | ORAL_TABLET | Freq: Two times a day (BID) | ORAL | Status: DC
Start: 2021-05-31 — End: 2021-05-31

## 2021-05-31 MED ORDER — SUCCINYLCHOLINE CHLORIDE 200 MG/10ML IV SOSY
PREFILLED_SYRINGE | INTRAVENOUS | Status: AC
Start: 2021-05-31 — End: 2021-06-01
  Filled 2021-05-31: qty 10

## 2021-05-31 MED ORDER — AMIODARONE HCL 200 MG PO TABS: 200.0000 mg | ORAL_TABLET | Freq: Once | ORAL | Status: DC

## 2021-05-31 MED ORDER — AMIODARONE HCL 200 MG PO TABS
400.0000 mg | ORAL_TABLET | Freq: Every day | ORAL | Status: DC
Start: 2021-06-01 — End: 2021-05-31

## 2021-05-31 MED ORDER — AMIODARONE LOAD VIA INFUSION
150.0000 mg | Freq: Once | INTRAVENOUS | Status: DC
Start: 2021-05-31 — End: 2021-05-31
  Filled 2021-05-31: qty 83.34

## 2021-05-31 MED ORDER — MIDAZOLAM HCL 2 MG/2ML IJ SOLN
INTRAMUSCULAR | Status: AC
  Administered 2021-05-31: 2 mg via INTRAVENOUS
  Filled 2021-05-31: qty 2

## 2021-05-31 MED ORDER — EMPAGLIFLOZIN 10 MG PO TABS
10.0000 mg | ORAL_TABLET | Freq: Every day | ORAL | Status: DC
  Filled 2021-05-31: qty 1

## 2021-05-31 MED ORDER — ROCURONIUM BROMIDE 10 MG/ML (PF) SYRINGE
PREFILLED_SYRINGE | INTRAVENOUS | Status: AC
  Administered 2021-05-31: 80 mg via INTRAVENOUS
  Filled 2021-05-31: qty 10

## 2021-05-31 MED ORDER — AMIODARONE HCL IN DEXTROSE 360-4.14 MG/200ML-% IV SOLN: 60.0000 mg/h | INTRAVENOUS | Status: DC

## 2021-05-31 MED ORDER — SODIUM CHLORIDE 0.9 % IV SOLN
3.0000 g | Freq: Two times a day (BID) | INTRAVENOUS | Status: DC
  Administered 2021-06-01: 3 g via INTRAVENOUS

## 2021-05-31 MED ORDER — LIDOCAINE BOLUS VIA INFUSION
50.0000 mg | Freq: Once | INTRAVENOUS | Status: AC
  Administered 2021-05-31: 50 mg via INTRAVENOUS
  Filled 2021-05-31: qty 52

## 2021-05-31 MED ORDER — AMIODARONE HCL IN DEXTROSE 360-4.14 MG/200ML-% IV SOLN
60.0000 mg/h | INTRAVENOUS | Status: AC
  Administered 2021-05-31: 60 mg/h via INTRAVENOUS

## 2021-05-31 MED ORDER — LIDOCAINE BOLUS VIA INFUSION
50.0000 mg | Freq: Once | INTRAVENOUS | Status: AC
  Administered 2021-05-31: 50 mg via INTRAVENOUS

## 2021-05-31 MED ORDER — FUROSEMIDE 10 MG/ML IJ SOLN
80.0000 mg | Freq: Once | INTRAMUSCULAR | Status: AC
  Administered 2021-05-31: 80 mg via INTRAVENOUS
  Filled 2021-05-31: qty 8

## 2021-05-31 MED ORDER — FUROSEMIDE 10 MG/ML IJ SOLN: 60.0000 mg | Freq: Once | INTRAMUSCULAR | Status: DC

## 2021-05-31 MED ORDER — FENTANYL CITRATE PF 50 MCG/ML IJ SOSY
PREFILLED_SYRINGE | INTRAMUSCULAR | Status: AC
  Administered 2021-05-31: 100 ug via INTRAVENOUS
  Filled 2021-05-31: qty 2

## 2021-05-31 MED ORDER — ISOSORBIDE MONONITRATE ER 60 MG PO TB24
60.0000 mg | ORAL_TABLET | Freq: Every day | ORAL | Status: DC
  Administered 2021-05-31: 60 mg via ORAL
  Filled 2021-05-31: qty 2

## 2021-05-31 MED ORDER — PHENYLEPHRINE 80 MCG/ML (10ML) SYRINGE FOR IV PUSH (FOR BLOOD PRESSURE SUPPORT)
PREFILLED_SYRINGE | INTRAVENOUS | Status: AC
  Administered 2021-05-31: 160 ug
  Filled 2021-05-31: qty 10

## 2021-05-31 MED ORDER — AMIODARONE IV BOLUS ONLY 150 MG/100ML: 150.0000 mg | Freq: Once | INTRAVENOUS | Status: DC

## 2021-05-31 MED ORDER — MAGNESIUM SULFATE 4 GM/100ML IV SOLN
4.0000 g | Freq: Once | INTRAVENOUS | Status: AC
  Administered 2021-05-31: 4 g via INTRAVENOUS
  Filled 2021-05-31: qty 100

## 2021-05-31 MED ORDER — AMIODARONE HCL IN DEXTROSE 360-4.14 MG/200ML-% IV SOLN
60.0000 mg/h | INTRAVENOUS | Status: DC
Start: 2021-06-01 — End: 2021-06-02
  Administered 2021-06-01: 60 mg/h via INTRAVENOUS
  Administered 2021-06-01: 30 mg/h via INTRAVENOUS
  Administered 2021-06-01 – 2021-06-02 (×3): 60 mg/h via INTRAVENOUS
  Filled 2021-05-31 (×5): qty 200

## 2021-05-31 MED ORDER — LIDOCAINE IN D5W 4-5 MG/ML-% IV SOLN
0.5000 mg/min | INTRAVENOUS | Status: DC
  Administered 2021-05-31: 2 mg/min via INTRAVENOUS
  Administered 2021-06-01: 1 mg/min via INTRAVENOUS
  Filled 2021-05-31 (×2): qty 500

## 2021-05-31 MED ORDER — HEPARIN BOLUS VIA INFUSION
4000.0000 [IU] | Freq: Once | INTRAVENOUS | Status: AC
  Administered 2021-05-31: 4000 [IU] via INTRAVENOUS
  Filled 2021-05-31: qty 4000

## 2021-05-31 MED ORDER — HEPARIN (PORCINE) 25000 UT/250ML-% IV SOLN
1500.0000 [IU]/h | INTRAVENOUS | Status: DC
  Administered 2021-05-31: 1300 [IU]/h via INTRAVENOUS
  Administered 2021-06-01 – 2021-06-02 (×2): 1500 [IU]/h via INTRAVENOUS
  Filled 2021-05-31 (×3): qty 250

## 2021-05-31 MED ORDER — KETAMINE HCL 50 MG/5ML IJ SOSY
PREFILLED_SYRINGE | INTRAMUSCULAR | Status: AC
  Filled 2021-05-31: qty 5

## 2021-05-31 MED FILL — Medication: Qty: 1 | Status: AC

## 2021-05-31 NOTE — Progress Notes (Addendum)
ANTICOAGULATION CONSULT NOTE - Initial Consult ? ?Pharmacy Consult for Heparin infusion ?Indication: chest pain/ACS ? ?No Known Allergies ? ?Patient Measurements: ?Height: 6\' 1"  (185.4 cm) ?Weight: 101.6 kg (224 lb) ?IBW/kg (Calculated) : 79.9 ?Heparin Dosing Weight: 100.4 kg ? ?Vital Signs: ?Temp: 97.3 ?F (36.3 ?C) (05/04 2023) ?Temp Source: Axillary (05/04 2023) ?BP: 133/65 (05/04 2015) ?Pulse Rate: 71 (05/04 2030) ? ?Labs: ?Recent Labs  ?  06/13/2021 ?2207 05/29/21 ?0744 05/29/21 ?0744 05/30/21 ?0242 05/30/21 ?0242 05/30/21 ?0530 05/30/21 ?[id number] 05/30/21 ?1044 05/30/21 ?1652 05/31/21 ?[id number] 05/31/21 ?1946  ?HGB  --  9.0*   < > 8.9*  --  9.5* 8.5*  --   --   --  8.9*  ?HCT  --  28.1*   < > 28.3*  --  29.7* 25.0*  --   --   --  28.8*  ?PLT  --  153   < > 157  --  169  --   --   --   --  164  ?HEPARINUNFRC 0.28* 0.49  --  <0.10*  --   --   --   --   --   --   --   ?CREATININE  --   --   --   --    < > 2.13*  --  2.33* 2.64* 2.94*  --   ?TROPONINIHS  --   --   --   --   --  5,164*  --   --   --   --   --   ? < > = values in this interval not displayed.  ? ? ?Estimated Creatinine Clearance: 31.8 mL/min (A) (by C-G formula based on SCr of 2.94 mg/dL (H)). ? ? ?Medical History: ?Past Medical History:  ?Diagnosis Date  ? CKD (chronic kidney disease), stage III ([place])   ? Claudication [facility])   ? Coronary artery disease   ? a. 08/2001 s/p DES to RCA/RI;  b. 07/2006 s/p DES to p/d LCX;  c. 11/2007 CABGx5 (LIMA->LAD, VG->D1, LRA->OM1, VG->PDA->LPL); c. 03/2015: Synergy DES to mid-Cx in the setting of NSTEMI; d. 11/2015 NSTEMI->Med Rx. e. NSTEMI 01/2018 -> med rx.  ? Diabetes mellitus type 2 in nonobese [facility])   ? a. A1c 7.0 0000000.  ? Diastolic dysfunction   ? a. 02/2016 Echo: EF 50-55%, gr1DD, Ao sclerosis, dil Ao root (51mm), sev dil LA, triv TR, PASP 58mmHg.  ? Hyperlipidemia   ? Hypertension   ? Noncompliance   ? Right carotid bruit   ? a. noted 01/2018, will need OP eval when patient complies with follow-up.  ? ? ?Medications:   ?Medications Prior to Admission  ?Medication Sig Dispense Refill Last Dose  ? amiodarone (PACERONE) 200 MG tablet Take 1 tablet (200 mg total) by mouth daily for 5 days. 5 tablet 0 Past Week  ? amLODipine (NORVASC) 10 MG tablet Take 1 tablet (10 mg total) by mouth daily. Please make PCP appointment. 30 tablet 0 Past Week  ? aspirin 81 MG EC tablet Take 1 tablet (81 mg total) by mouth daily. Swallow whole. 30 tablet 1 04/28/2021  ? atorvastatin (LIPITOR) 80 MG tablet Take 1 tablet (80 mg total) by mouth every evening. 30 tablet 0 Past Week  ? carvedilol (COREG) 6.25 MG tablet Take 1 tablet (6.25 mg total) by mouth 2 (two) times daily with a meal. 28 tablet 0 Past Week  ? metFORMIN (GLUCOPHAGE) 500 MG tablet Take 1 tablet (500 mg total) by mouth daily with breakfast. 30  tablet 1   ? nitroGLYCERIN (NITROSTAT) 0.4 MG SL tablet PLACE 1 TABLET UNDER THE TONGUE EVERY 5 MINUTES AS NEEDED FOR CHEST PAIN (UP TO 3 DOSES. IF TAKING 3RD DOSE CALL 911) (Patient not taking: Reported on 05/21/2021) 25 tablet 2 Not Taking  ? ? ?Assessment: ?[name] s/p cardiac arrest on 05/31/21 due to VT/TdP. Pt admitted 4 days ago with NSTEMI and declined cardiac catheterization. Patient has been started on lidocaine infusion and is being re-loaded with amiodarone infusion. Pharmacy has been consulted to manage heparin infusion as per ACS protocol.  ? ?Hgb 8.9, stable ?Plt 164, stable ?No s/sx of bleeding  ? ?Goal of Therapy:  ?Heparin level 0.3-0.7 units/ml ?Monitor platelets by anticoagulation protocol: Yes ?  ?Plan:  ?Discontinue subcutaneous heparin ?Give 4000 units bolus x 1 ?Start heparin infusion at 1300 units/hr ?Check heparin level in 8 hours ?Check CBC and heparin level daily ?Monitor for s/sx of bleeding. ? ?Darrell [name] ?05/31/2021,8:33 PM ? ? ?

## 2021-05-31 NOTE — Progress Notes (Signed)
eLink Physician-Brief Progress Note ?Patient Name: Darrell [name] ?DOB: Mar 29, 1955 ?MRN: [id number] ? ? ?Date of Service ? 05/31/2021  ?HPI/Events of Note ? Hypotension - BP = 77/47 with MAP = 58. HR = 51. Sat = 100% and CVP = 14.   ?eICU Interventions ? Plan: ?Dopamine IV infusion. Titrate to MAP > 65.   ? ? ? ?Intervention Category ?Major Interventions: Hypotension - evaluation and management ? ?[name],Elihue Dennard Nip ?05/31/2021, 10:52 PM ?

## 2021-05-31 NOTE — Consult Note (Signed)
? ?  [facility] CM Inpatient Consult ? ? ?05/31/2021 ? ?Darrell [name] ?1955-05-02 ?[id number] ? ?[facility] Customer service manager [THN]  Accountable Care Organization [ACO] Patient: Medicare ACO REACH ? ?Primary Care Provider:  Hoy Register, MD, [facility] ?  ? ?Patient screened for hospitalization with noted high risk score for unplanned readmission risk and to assess for potential [facility]  [[facility]N] Care Management service needs for post hospital transition.  Review of patient's medical record reveals patient is currently in progressive care with recent extubation noted per MD progress notes for NSTEMI. Patient to transfer out of 2H. ? ?Plan:  Continue to follow progress and disposition to assess for post hospital care management needs.   ? ?For questions contact:  ? ?Charlesetta Shanks, RN BSN CCM ?[facility] [employer] Liaison ? [phone] business mobile phone ?Toll free office [phone]  ?Fax number: [phone] ?[email or web address] ?[email or web address]  ?  ? ?

## 2021-05-31 NOTE — Plan of Care (Signed)

## 2021-05-31 NOTE — Progress Notes (Signed)
Patient experienced Vtach. Pt became nonresponsive. Code called. Compressions started. Pulmonary critical care MD paged. Code team and rapid response arrived. Patient transferred to [facility]. ? ?Kenard Gower, RN  ?

## 2021-05-31 NOTE — Progress Notes (Signed)
Patient is having 15 beats of Vtach. Patient BP 126/67. MD paged. Obtaining EKG. ? ?Kenard Gower, RN  ?

## 2021-05-31 NOTE — Progress Notes (Signed)
? ?Progress Note ? ?Patient Name: Darrell [name] ?Date of Encounter: 05/31/2021 ? ?[facility] Cardiologist: Minus Breeding, MD  ? ?Patient Profile  ?   ?66 y.o. male with a PMH of CAD s/p CABG in 2009 with subsequent PCI's, HTN, HLD, DM type II, CKD stage IIIb who is being seen 05/14/2021 for the evaluation of NSTEMI at the request of Dr. Roslynn Amble. ? ?Has been doing well with medical management.  Initially declined cardiac catheterization.  Had completed 72 hours of IV heparin, was weaned off IV nitroglycerin and converted to p.o. meds. ?On the evening prior to planned discharge, I had apparent brief cardiac arrest with torsade de pointes.  Rapid ROSC, but intubated overnight on 65 2-05/30/2021.  Extubated (05/30/2021) ?Renal function worsening with reduced urine output. ? ? ?Assessment & Plan  ?  ?Principal Problem: ?  NSTEMI (non-ST elevated myocardial infarction) ([place]) ?Active Problems: ?  Coronary artery disease involving native coronary artery of native heart with unstable angina pectoris ([place]) ?  Hypertensive emergency ?  Ischemic cardiomyopathy ?  Torsades de pointes ([place]) ?  Diabetes mellitus type II, non insulin dependent ([place]) ?  Hyperlipidemia with target LDL less than 70 ?  history of VT arrest  ?  CKD (chronic kidney disease), stage III ([place]) ?  History of noncompliance with medical treatment, presenting hazards to health ?  Normocytic anemia ?  PNA (pneumonia) ? ?Diabetes mellitus type II, non insulin dependent ([place]) ?Well controlled, A1C of 5.2 ?Hold metformin while inpatient ?SSI and accuchecks qac/hs  ? ?history of VT arrest  ?Hx of VT arrest in 10/2020 ?Discussion with EP at that time of ICD placement; however, after discussion over risks/benefits patient declined and was continued on amiodarone which he does not appear to be taking on regular basis ?Has not followed up with cardiology since this time  ?Will continue telemetry/amiodarone.  ? ?CKD (chronic kidney disease), stage III ([place]) ?Baseline  creatinine of 1.8-2.0 ?Stable, continue to monitor  ? ?Hypertensive emergency ?Hypertensive emergency in setting of possible medical noncompliance. Unsure how much this is contributing to NSTEMI ?Continue coreg and norvasc. Have room to titrate up his coreg if needed and heart rate allows ?On nitro drip for chest pain and will wean as tolerated ?PRN IV parameters will be placed  ? ?NSTEMI (non-ST elevated myocardial infarction) ([place]) ?66 year old with known CAD s/p CABG with severe 3 vessel CAD presenting with chest pain, HTN emergency and NSTEMI ?-obs to progressive ?-ASA given with EMS ?-troponin [id number]. Trend to peak  ?-heparin gtt ?-nitro gtt for chest pain, wean as tolerated  ?-cardiology consulted with plans for [facility] tomorrow ?-echo pending  ?-continue statin, coreg and aspirin  ?-last LHC in 10/2020 with severe native 3 vessel CAD and following findings:  ?  Total occlusion of the LAD with severe diffuse disease beyond the LIMA insertion ? Total occlusion of the LCx ? Total occlusion of the RCA, distal branches collateralized by septal perforator branches of the LAD visualized on the  LIMA injections ?-no targets for PCI and recommended medical therapy  ? ?Right carotid bruit ?No hx of [place] with poor f/u ?Check carotid doppler--->80-99% right ICA stenosis and left 60-79% stenosis. Discussed with on call vascular and they recommended outpatient f/u, continued medical therapy.  ?Continue ASA/statin  ? ?Normocytic anemia ?Hx of anemia with hx of CKD ?Will check iron studies and monitor  ?Encourage colon cancer screening outpatient  ? ?NSTEMI (non-ST elevated myocardial infarction) ([place]) ?Presentation with significant ST changes  with ST depressions in the setting of ongoing chest pain, notable troponin elevation consistent with non-STEMI presentation.  Troponins increased over 9000. ?-> Initiated on early conservative management with IV heparin and IV nitroglycerin with plans for cardiac catheterization on  06/03/2021, however the patient has chosen to consider continued conservative/noninvasive approach with medical management only.  Would prefer to avoid catheterization. ? ?Echocardiogram shows relatively stable findings with perhaps mildly reduced overall function. ?Initially declined cardiac catheterization, therefore mutual agreement made to continue aggressive medical therapy. => Completed 72 hours of IV heparin. ->  Off IV nitroglycerin and converted to Imdur.  Restarting today. ? ? ?Plan:  ?Has can likely completed his infarct, does not have any revascularization options open.  Was initially very concerned about cardiac catheterization because of history of large hematoma.  He now indicates that he would be interested in cardiac catheterization after his recent event.  Unfortunately, with worsening renal function, not favorable option. ?For now we will try to stabilize him from an arrhythmia standpoint monitor renal function await for return of renal function.  If he does show signs of improvement, could consider cardiac catheterization.  If it worsens, then there is concern that he may potentially move towards end-stage renal disease if we proceed with invasive cardiac procedures.. ? ?Hypertensive emergency ?Significantly elevated blood pressure initially on arrival. ? ?Now stabilized. ?On carvedilol 12.5 mg daily along with hydralazine/nitrate. ? ?Not on high-dose amlodipine-for microvascular ischemia, we may want to restart once stabilized. ? ?Using hydralazine/nitrate combination as opposed to [name]/ACE-I due to worsening renal function (in setting of stage IIIb CKD.[place]) ? ?Coronary artery disease involving native coronary artery of native heart with unstable angina pectoris ([place]) ?Presented with unstable angina/non-STEMI.  He is opted for aggressive medical management, and to avoid cardiac catheterization. ? ?Known significant negative and graft CAD with patent SVG-OM and LIMA-LAD 2 to almost atretic LAD.   Native vessel occluded with SVG-RCA and SVG-diagonal both occluded.  Very unfavorable PCI options.  Essentially unrevealing notable decent distal vessel as the OM branch was grafted by the SVG. ?Patient initially declined cardiac catheterization.  Now he is interested in doing so.  Unfortunately renal function is an issue.  See below. ? ?Plan: Continue with hydralazine and nitrate along with beta-blocker. ?On aspirin and Plavix. ?High-dose statin. ? ?Await return of renal function.  Consider catheterization if improved.  If not, catheterization would not be favorablen for fear of progressively worsening renal function. ? ? ?Diabetes mellitus type II, non insulin dependent ([place]) ?Managed by [facility] hospitalist.  Metformin on hold.  Sliding scale, would be okay ? ?history of VT arrest  ?In the past had VT arrest, but not described as torsades.   ?Has now had at least 1 episode of torsades.   ?Has declined ICD in the past.   ? ?Has been maintained on amiodarone.  ?Need to be concerned about ischemic VT, would like to do cardiac catheterization, and he now is agreeable to that.  Unfortunately with worsening creatinine and urine output, I am concerned of proceeding the Cath Lab at this point for fear of potentially leading to worsening renal failure. ? ?Hyperlipidemia with target LDL less than 70 ?Last LDL 105.  Question compliance with medical management. ?Continue high-dose statin. ? ?Has had a history of missing appointments.  Not sure how well he would comply with outpatient lipid clinic follow-up for PCSK9 inhibitor. ? ?CKD (chronic kidney disease), stage III ([place]) ?Stage IIIb.  Now with worsening renal function after his brief  arrest.  Urine output is significant reduced despite a dose of Lasix.  I am reluctant to give him IV fluids.  Now that he is awake and alert, we will see how he does with his own p.o. intake.   ?Etiology is most likely related to hypotension in the setting of his arrest.  PCCM and [facility]  managing. ? ?Ischemic cardiomyopathy ?Previous EF was 45 to 50%.  Now 40 to 45%.  Modest reduction. ?Seems to be relatively euvolemic.  Therefore not on diuretic. ? ?Beta-blocker dose titrated up.  Continued on amlodipine

## 2021-05-31 NOTE — TOC Benefit Eligibility Note (Signed)
Patient Advocate Encounter ? ?Insurance verification completed.   ? ?The patient is currently admitted and upon discharge could be taking Jardiance 10mg  tablets. ? ?Began paperwork for patient assistance program. Pharmacist to follow up. ? ? , CPhT-Adv ?Pharmacy Patient Advocate Specialist ?[facility] Pharmacy Patient Advocate Team ?Direct Number: [phone]  Fax: [phone] ? ? ? ? ? ? ? ?

## 2021-05-31 NOTE — Progress Notes (Addendum)
?   05/31/21 1920  ?Clinical Encounter Type  ?Visited With Patient  ?Visit Type Code  ?Referral From Patient  ?Consult/Referral To Chaplain  ? ?Chaplain responded to Code Blue for 66 y.o. patient who was transferred to [facility] following an arrest. Patient was conscious and there was no family at bedside.  Chaplain is available for follow-up if needed.  ? ?Jon Gills, Resident Chaplain ?[phone] ?

## 2021-05-31 NOTE — Progress Notes (Addendum)
PCCM Progress Note  ? ?Called to bedside by nurse stating patient had a change in rhythm with associate symptoms including lightheaded, shortness of breath and feeling of impending doom. RN stated rhythm appeared V-tach in nature.  ? ?Rhythm corrected with encouraged valsalvae prior to my arrival.  ? ?Rhythm strip review revealed a 30 second run of V-tach with multiple shorter run of V-tach since 6pm this afternoon  ? ?Already on PO Amio. Cardiology re-consult pending.  ? ?Darrell Candella D. Harris, NP-C ? Pulmonary & Critical Care ?Personal contact information can be found on Amion  ?05/31/2021, 7:04 PM ? ? ?

## 2021-05-31 NOTE — Progress Notes (Addendum)
Notified of VT this evening - 30 sec run.  ?IV amiodarone was transitioned to 200 mg PO. Planned to increase to 400 mg BID. Unfortunately, pt coded with ROSC achieved after CPR, no shock.  ?Telemetry reviewed from 4e. Appears consistent with R on T leading to torsades.  ?Case reviewed with Dr. Herbie Baltimore who advised EP consult. Per Dr. Elberta Fortis, will restart IV amiodarone, start lidocaine at 2 mg/min. I have ordered 4 g IV Mg. Lidocaine level in the morning.  ? ?Dr. Elberta Fortis advised to avoid bradycardia. If HR < 55, would use dopamine or dobutamine.  ? ?Will also recommend palliative care consult.  ?

## 2021-05-31 NOTE — Progress Notes (Signed)
Approx 1930 Received pt to 2H15 as transfer from 4E s/p brief Vfib arrest. Arrives alert, oriented, SR 70s, sats 99% on NRB/15 lpm. BP 135/65 ? ?Cards and CCM here discussing plan. EKG done, drawing labs.  ? ?Pt oriented to room and verbalized understanding to notify RN immediately with any CP/SOB or concerns. ?

## 2021-05-31 NOTE — Progress Notes (Signed)
eLink Physician-Brief Progress Note ?Patient Name: Darrell [name] ?DOB: Apr 02, 1955 ?MRN: [id number] ? ? ?Date of Service ? 05/31/2021  ?HPI/Events of Note ? Nursing reports that BP and HR are improved with Dopamine. However, patient has increased ectopy now. Cardiology ordered a Norepinephrine IV infusion. No urine output. Nursing asking for Foley catheter. Patient has feeling of impending doom.   ?eICU Interventions ? Plan: ?Titrate Norepinephrine up and Dopamine down in an effort to minimize ectopy.  ?I/O Cath PRN.  ?Trial of BiPAP. ?Will request that PCCM ground team evaluate the patient again at bedside.   ? ? ? ?Intervention Category ?Major Interventions: Arrhythmia - evaluation and management;Hypoxemia - evaluation and management ? ?Avyaan Summer,Acey Dennard Nip ?05/31/2021, 11:40 PM ?

## 2021-05-31 NOTE — Progress Notes (Addendum)
[facility] PCCM PROGRESS NOTE ? ?S: Called to bedside in the setting of VT/torsades arrest. Briefly this is a 66 year old male on the PCCM service with cardiology consulting. He was admitted 4 days ago with chest pain and NSTEMI. Declined cardiac catheterization. Course complicated by VT, torsades on 5/3. Did not lose pulse, but became unresponsive and was intubation. Able to be extubated later that day.  ? ?Transferred out of ICU on 5/4 after improvement with amiodarone. 5/4 around 1900 he again had VT/torsades this time with arrest. Briefly received CPR/ACLS and had ROSC prior to defibrillation. Transferred back to ICU  ? ?O: ?BP 130/61 (BP Location: Left Arm)   Pulse 71   Temp (!) 97.4 ?F (36.3 ?C) (Oral)   Resp 14   Ht 6\' 1"  (1.854 m)   Wt 101.6 kg   SpO2 100%   BMI 29.55 kg/m?  ? ?General:  elderly appearing male of normal body habitus ?Neuro:  Alert, oriented, non-focal ?HEENT:  [place]/AT, PERRL, no JVD ?Cardiovascular:  RRR, no MRG. Occasional PVCs. JVP elevation to mandible.  ?Lungs:  Clear bilateral breath sounds ?Abdomen:  Soft, non-tender, non-distended ?Musculoskeletal:  No acute deformity ?Skin:  Intact, MMM ? ? ?A/P: ? ?Ventricular tachycardia, torsades arrest: ?- Awake and following commands luckily so no need for intubation or TTM ?- Transfer back to ICU for ongoing telemetry monitoing.  ?- have discussed with cardiology who have seen and are concerned amiodarone may be a contributing factor. EP involved. Amio and lidocaine ?- Heparin per pharmacy ACS dosing ?- Avoid bradycardia, use dopamine or dobutamine for heart rate support if needed ?- diurese ?- EKG, CBC, CMP, Mag, Phos  ? ?NSTEMI: known 3 vessel dz ?- initially refused cath, now renal function is a limitation should he decide to proceed.  ?- management per cardiology ? ? ?Georgann Housekeeper, AGACNP-BC ?[place] [facility] ? ?See Amion for personal pager ?PCCM on call pager [phone] until 7pm. ?Please call Elink 7p-7a.  [phone] ? ?05/31/2021 7:55 PM ? ? ? ? ? ? ?

## 2021-05-31 NOTE — Progress Notes (Signed)
? ?NAME:  Darrell [name], MRN:  [id number], DOB:  [birth year], LOS: 2 ?ADMISSION DATE:  05/26/2021, CONSULTATION DATE: 05/30/2021 ?REFERRING MD: Rapid response team, CHIEF COMPLAINT: S/p torsade ? ?History of Present Illness:  ?66 year old male with coronary artery disease s/p CABG, prior history of VT arrest who initially presented with chest pain, noted to have acute non-ST elevation MI, medically managed as patient refused cardiac catheterization. ?On 5/3 in early morning hours patient was noted to go into ventricular tachycardia and then torsade, he became confused, rapid response was called, he was given magnesium, IV amiodarone, he was intubated and was transferred to ICU.  PCCM was consulted for help evaluation and management ? ?Pertinent  Medical History  ? ?Past Medical History:  ?Diagnosis Date  ? CKD (chronic kidney disease), stage III ([place])   ? Claudication [facility])   ? Coronary artery disease   ? a. 08/2001 s/p DES to RCA/RI;  b. 07/2006 s/p DES to p/d LCX;  c. 11/2007 CABGx5 (LIMA->LAD, VG->D1, LRA->OM1, VG->PDA->LPL); c. 03/2015: Synergy DES to mid-Cx in the setting of NSTEMI; d. 11/2015 NSTEMI->Med Rx. e. NSTEMI 01/2018 -> med rx.  ? Diabetes mellitus type 2 in nonobese [facility])   ? a. A1c 7.0 0000000.  ? Diastolic dysfunction   ? a. 02/2016 Echo: EF 50-55%, gr1DD, Ao sclerosis, dil Ao root (84mm), sev dil LA, triv TR, PASP 35mmHg.  ? Hyperlipidemia   ? Hypertension   ? Noncompliance   ? Right carotid bruit   ? a. noted 01/2018, will need OP eval when patient complies with follow-up.  ? ? ? ?Significant Hospital Events: ?Including procedures, antibiotic start and stop dates in addition to other pertinent events   ? ? ?Interim History / Subjective:  ? ? ?Objective   ?Blood pressure 131/65, pulse 69, temperature (!) 97.4 ?F (36.3 ?C), temperature source Oral, resp. rate (!) 27, height 6\' 1"  (1.854 m), weight 101.6 kg, SpO2 95 %. ?CVP:  [9 mmHg-20 mmHg] 10 mmHg  ?Vent Mode: PSV;PRVC ?FiO2 (%):  [36 %-40 %] 36  % ?Pressure Support:  [8 cmH20] 8 cmH20  ? ?Intake/Output Summary (Last 24 hours) at 05/31/2021 1022 ?Last data filed at 05/31/2021 0600 ?Gross per 24 hour  ?Intake 2133.33 ml  ?Output 250 ml  ?Net 1883.33 ml  ? ?Filed Weights  ? 05/26/2021 0838  ?Weight: 101.6 kg  ? ? ?Examination: ?Physical exam: ?General: Acute on chronically ill-appearing male, lying on the bed ?HEENT: [place]/AT, eyes anicteric.  moist mucus membranes ?Neuro: Alert, awake following commands ?Chest: Coarse breath sounds, no wheezes or rhonchi ?Heart: Regular rate and rhythm, no murmurs or gallops ?Abdomen: Soft, nontender, nondistended, bowel sounds present ?Skin: No rash ? ? ?Resolved Hospital Problem list   ?Hypertensive emergency ? 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 ?Completed therapy with IV heparin for 48 hours ?Patient refused cardiac catheterization upon presentation but now he is willing for cardiac cath ?Cardiology is following, his serum creatinine continue to rise, he will have a cardiac cath at some point once serum creatinine improves ?Continue Plavix and atorvastatin ?Continue IV amiodarone infusion ?Monitor electrolytes and supplement, keep magnesium >2 and potassium >4 ?Continue with sliding scale insulin with CBG goal 140-180 ?Hemoglobin A1c is 5.2 ?Serum creatinine continue to  trend up ?Continue gentle IV diuresis ?Monitor intake and output ?Avoid nephrotoxic agents ?H&H remained stable ?Restarted back on Imdur, hydralazine and Coreg ?Monitor electrolyte ?Patient was successfully extubated yesterday ?Respiratory culture ?Patient is tolerating spontaneous breathing trial, will try to extubate him ?Follow-up respiratory  culture is growing mixed flora ?Continue IV Unasyn ? ? ? ?Best Practice (right click and "Reselect all SmartList Selections" daily)  ? ?Diet/type: Clear liquid diet ?DVT prophylaxis: prophylactic heparin  ?GI prophylaxis: N/A ?Lines: Central line, discontinue ?Foley:  N/A ?Code Status:  full code ?Last date of multidisciplinary goals of care discussion [5/4, see IPAL note] ? ?Labs   ?CBC: ?Recent Labs  ?Lab 05/24/2021 ?[id number] 05/29/2021 ?[id number] 05/29/21 ?[id number] 05/30/21 ?[id number] 05/30/21 ?0530 05/30/21 ?[id number]  ?WBC 10.0 9.6 7.4 8.2 8.6  --   ?NEUTROABS 7.0  --   --   --   --   --   ?HGB 11.6* 10.0* 9.0* 8.9* 9.5* 8.5*  ?HCT 37.9* 31.0* 28.1* 28.3* 29.7* 25.0*  ?MCV 84.6 80.9 82.2 82.7 82.7  --   ?PLT 221 173 153 157 169  --   ? ? ?Basic Metabolic Panel: ?Recent Labs  ?Lab 06/07/2021 ?0412 05/30/21 ?0530 05/30/21 ?[id number] 05/30/21 ?1044 05/30/21 ?1652 05/31/21 ?[id number]  ?NA 134* 133* 133* 133* 133* 132*  ?K 4.2 4.8 4.3 4.1 4.9 4.3  ?CL 103 103  --  105 104 103  ?CO2 23 16*  --  20* 19* 17*  ?GLUCOSE 117* 135*  --  186* 145* 141*  ?BUN 25* 33*  --  36* 39* 42*  ?CREATININE 1.75* 2.13*  --  2.33* 2.64* 2.94*  ?CALCIUM 8.0* 8.4*  --  7.9* 8.1* 7.7*  ?MG  --  2.0  --  2.3  --  2.2  ?PHOS  --  3.6  --   --   --   --   ? ?GFR: ?Estimated Creatinine Clearance: 31.8 mL/min (A) (by C-G formula based on SCr of 2.94 mg/dL (H)). ?Recent Labs  ?Lab 06/16/2021 ?0412 05/29/21 ?[id number] 05/30/21 ?0242 05/30/21 ?0530 05/30/21 ?1044  ?WBC 9.6 7.4 8.2 8.6  --   ?LATICACIDVEN  --   --   --  3.1* 1.2  ? ? ?Liver Function Tests: ?Recent Labs  ?Lab 05/17/2021 ?[id number] 05/30/21 ?0530  ?AST 22 25  ?ALT 11 11  ?ALKPHOS 87 61  ?BILITOT 0.7 1.0  ?PROT 6.8 6.0*  ?ALBUMIN 3.2* 2.4*  ? ?No results for input(s): LIPASE, AMYLASE in the last 168 hours. ?No results for input(s): AMMONIA in the last 168 hours. ? ?ABG ?   ?Component Value Date/Time  ? PHART 7.465 (H) 05/30/2021 [id number]  ? PCO2ART 24.9 (L) 05/30/2021 [id number]  ? PO2ART 298 (H) 05/30/2021 [id number]  ? HCO3 17.8 (L) 05/30/2021 0651   ? TCO2 18 (L) 05/30/2021 0651  ? ACIDBASEDEF 5.0 (H) 05/30/2021 [id number]  ? O2SAT 100 05/30/2021 0651  ?  ? ?Coagulation Profile: ?Recent Labs  ?Lab 05/09/2021 ?0845  ?INR 1.2  ? ? ?Cardiac Enzymes: ?No results for input(s): CKTOTAL, CKMB, CKMBINDEX, TROPONINI in the last 168 hours. ? ?HbA1C: ?Hgb A1c MFr Bld  ?Date/Time Value Ref Range Status  ?05/01/2021 08:45 AM 5.2 4.8 - 5.6 % Final  ?  Comment:  ?  (NOTE) ?Pre diabetes:          5.7%-6.4% ? ?Diabetes:              >6.4% ? ?Glycemic control for   <7.0% ?adults with diabetes ?  ?09/03/2020 03:25 PM 5.6  4.8 - 5.6 % Final  ?  Comment:  ?  (NOTE) ?Pre diabetes:          5.7%-6.4% ? ?Diabetes:              >6.4% ? ?Glycemic control for   <7.0% ?adults with diabetes ?  ? ? ?CBG: ?Recent Labs  ?Lab 05/30/21 ?1052 05/30/21 ?1121 05/30/21 ?1617 05/30/21 ?2232 05/31/21 ?0630  ?GLUCAP 181* 176* 130* 168* 146*  ? ?Critical care time:  ?  ? ?  ?Jacky Kindle MD ?[facility] ?See Amion for pager ?If no response to pager, please call [phone] until 7pm ?After 7pm, Please call E-link [phone] ? ? ? ?

## 2021-05-31 NOTE — Progress Notes (Signed)
Critical care attending progress note: ? ?Patient returns to ICU this evening for brief VT/VF cardiac arrest this evening. ? ?Patient is awake and denies chest pain.  ? ?On examination appears chronically ill. No distress. JVP at 6cm ASA, CVP 14. HS disant. Crackles at both bases.  ? ?Telemetry reviewed: shows runs of monomorphic VT.  ? ?Echo shows anterior and inferior wall motion abnormalities, new since last echo but consistent with occluded RCA and D1 grafts. ? ?EKG shows NSR with borderline QT prolongation and partial LBBB.  PVC frequent.  ? ?Assessment:  ? ?Principal Problem: ?  NSTEMI (non-ST elevated myocardial infarction) ([place]) ?Active Problems: ?  history of VT arrest  ?  Coronary artery disease involving native coronary artery of native heart with unstable angina pectoris ([place]) ?  Ischemic cardiomyopathy ?  CKD (chronic kidney disease), stage III ([place]) ?  Diabetes mellitus type II, non insulin dependent ([place]) ?  Hyperlipidemia with target LDL less than 70 ?  History of noncompliance with medical treatment, presenting hazards to health ?  Hypertensive emergency ?  Normocytic anemia ?  PNA (pneumonia) ?  Torsades de pointes ([place]) ?   ?Recurrent runs of VT. Potential causes ?Scar related given monomorphic nature. Amiodarone and lidocaine - follow lidocaine level and QTc. Ensure normal magnesium and potassium. Avoid bradycardia to prevent R on T initiation.  ?Ischemia related. - Serial troponin. Restart heparin, continue ASA and Plavix, continue oral nitrates.  ?Heart failure related - Diurese tonight - target normal CVP. Relieving venous congestion might also improve creatinine. ?Increasing respiratory distress from HF - contributing to ventricular irritability with increasing runs of VT. Will intubate.  ? ?CRITICAL CARE ?Performed by: [name] ? ? ?Total critical care time: 50 minutes ? ?Critical care time was exclusive of separately billable procedures and treating other patients. ? ?Critical care was  necessary to treat or prevent imminent or life-threatening deterioration. ? ?Critical care was time spent personally by me on the following activities: development of treatment plan with patient and/or surrogate as well as nursing, discussions with consultants, evaluation of patient's response to treatment, examination of patient, obtaining history from patient or surrogate, ordering and performing treatments and interventions, ordering and review of laboratory studies, ordering and review of radiographic studies, pulse oximetry, re-evaluation of patient's condition and participation in multidisciplinary rounds. ? ?[name], MD FRCPC ?ICU Physician ?[place]  ?Pager: [phone] ?Mobile: [phone] ?After hours: [phone]. ? ?

## 2021-05-31 NOTE — Progress Notes (Signed)
? ?  I was in the hallway on the 4th floor when Code Blue was called. ? ?When I entered the room patient was unresponsive with PMVT/VF on monitor. ? ?Active CPR had just been started.  ? ?Rapid Response appeared quickly. We quickly repositioned pads on patient. As we were charging the external defibrillator patient broke into a junctional rhythm then into SR. He had a palpable pulse but remained lethargic. I bagged the patient for several minutes and he became much more alert and was able to follow commands although he seemed a bit confused at times. He had frequent brief runs of VT. BP was stable.  ? ?We then transported the patient down to 2H15 and handoff made to the CCM team.  ? ?Total CCT was 40 mins.  ? ?Arvilla Meres, MD  ?11:02 PM ?  ? ?

## 2021-05-31 NOTE — Assessment & Plan Note (Addendum)
Probably he did not truly had torsades de pointes probably was more likely just simply VT. ?

## 2021-05-31 NOTE — Progress Notes (Addendum)
? ?Progress Note ? ?Patient Name: Darrell [name] ?Date of Encounter: 05/31/2021 ? ?[facility] Cardiologist: Minus Breeding, MD  ? ?Patient Profile  ?   ?66 y.o. male with a PMH of CAD s/p CABG in 2009 with subsequent PCI's, HTN, HLD, DM type II, CKD stage IIIb who is being seen 05/21/2021 for the evaluation of NSTEMI at the request of Dr. Roslynn Amble. ?  ? ?Assessment & Plan  ?  ?Principal Problem: ?  NSTEMI (non-ST elevated myocardial infarction) ([place]) ?Active Problems: ?  Coronary artery disease involving native coronary artery of native heart with unstable angina pectoris ([place]) ?  Hypertensive emergency ?  Ischemic cardiomyopathy ?  Diabetes mellitus type II, non insulin dependent ([place]) ?  Hyperlipidemia with target LDL less than 70 ?  history of VT arrest  ?  CKD (chronic kidney disease), stage III ([place]) ?  History of noncompliance with medical treatment, presenting hazards to health ?  Normocytic anemia ?  PNA (pneumonia) ? ? ?Patient transferred to ICU overnight.  Somewhat confusing story.  Apparently had a run of torsades and questionable loss of consciousness.  Was intubated.  Ended up becoming hypotensive requiring Levophed therefore his antihypertensives were held. ?  ?He is now normotensive off pressors with plans to extubate. ?  ?Patient has known history of VTE.  We will check electrolytes to ensure that there are supplemented-potassium magnesium appear to be normal. ?  ?We will rebolus amiodarone IV and run overnight.  Then restart p.o. tomorrow. ?Restart hydralazine at lower dose along with nitrate_0 ?Chest x-ray with some evidence of pulmonary edema, will give 1 dose of IV Lasix and start p.o. Lasix tomorrow. ? ?Otherwise continue previous meds accordingly.  We will reinitiate his blood pressure medications to get back to where he was yesterday in the next couple days. ? ?CRITICAL CARE ? ?The patient is critically ill with multiple organ systems failure and requires high complexity decision making  for assessment and support, frequent evaluation and titration of therapies, application of advanced monitoring technologies and extensive interpretation of multiple databases.  ?  ?Critical Care Time devoted to patient care services described in this note is  50 Minutes.  ? ?Performed [id number] Ellyn Hack ?  ?Critical care time was exclusive of separately billable procedures and treating other patients. ? ?Critical care was necessary to treat or prevent imminent or life-threatening deterioration. ? ?Critical care was time spent personally by me on the following activities: development of treatment plan with patient and/or surrogate as well as nursing, discussions with consultants, evaluation of patient's response to treatment, examination of patient, obtaining history from patient or surrogate, ordering and performing treatments and interventions, ordering and review of laboratory studies, ordering and review of radiographic studies, pulse oximetry and re-evaluation of patient's condition. ? ?This time reflects time of care of this signee _1  Ellyn Hack, MD ?@. This critical care time does not reflect procedure time, or teaching time or supervisory time of PA/NP/Med student/Med Resident etc but could involve care discussion time with the patient, family & RN staff. ? ?  ?. ? ?Subjective  ? ?Overnight events somewhat confusing, apparently he had some runs of VT/torsades.  There was question about ventricular fibrillation and loss of consciousness, loss of pulse.  He ended up getting intubated and sedated.  Was started on Levophed and all of his BP meds were stopped. ?This morning, blood pressures are relatively stable.  He looks relatively comfortable and potentially ready for extubation. ? ?Inpatient Medications  ?  ?Scheduled Meds: ?  amiodarone  150 mg Intravenous Once  ? amiodarone  200 mg Oral Daily  ? atorvastatin  80 mg Oral QPM  ? carvedilol  12.5 mg Oral BID WC  ? chlorhexidine gluconate (MEDLINE KIT)  15 mL  Mouth Rinse BID  ? Chlorhexidine Gluconate Cloth  6 each Topical Daily  ? clopidogrel  75 mg Oral Daily  ? ferrous sulfate  325 mg Oral Q breakfast  ? furosemide  40 mg Oral Daily  ? heparin injection (subcutaneous)  5,000 Units Subcutaneous Q8H  ? hydrALAZINE  25 mg Oral Q8H  ? insulin aspart  0-9 Units Subcutaneous TID WC  ? polyethylene glycol  17 g Oral Daily  ? senna  1 tablet Oral QHS  ? sodium chloride flush  3 mL Intravenous Q12H  ? sodium chloride flush  3 mL Intravenous Q12H  ? ?Continuous Infusions: ? sodium chloride 20 mL/hr at 05/30/21 1900  ? sodium chloride    ? sodium chloride    ? amiodarone 30 mg/hr (05/30/21 1900)  ? ampicillin-sulbactam (UNASYN) IV 3 g (05/30/21 2109)  ? nitroGLYCERIN Stopped (05/30/21 0537)  ? ?PRN Meds: ?sodium chloride, sodium chloride, acetaminophen, metoprolol tartrate, ondansetron (ZOFRAN) IV, sodium chloride flush, sodium chloride flush  ? ?Vital Signs  ?  ?Vitals:  ? 05/30/21 2000 05/30/21 2100 05/30/21 2103 05/30/21 2200  ?BP: (!) 92/58 (!) 122/55 (!) 172/53 (!) 110/56  ?Pulse: 69  69 66  ?Resp: (!) 23 (!) 26 (!) 30 16  ?Temp:      ?TempSrc:      ?SpO2: 95%  95% 96%  ?Weight:      ?Height:      ? ? ?Intake/Output Summary (Last 24 hours) at 05/31/2021 0002 ?Last data filed at 05/30/2021 1900 ?Gross per 24 hour  ?Intake 1842.13 ml  ?Output 185 ml  ?Net 1657.13 ml  ? ? ?  05/05/2021  ?  8:38 AM 11/02/2020  ?  4:02 AM 11/01/2020  ?  5:00 AM  ?Last 3 Weights  ?Weight (lbs) 224 lb 219 lb 5.7 oz 221 lb 1.6 oz  ?Weight (kg) 101.606 kg 99.5 kg 100.29 kg  ?   ? ?Telemetry  ?  ?Sinus Rhythm with PVCs, PACs - Personally Reviewed ? ?Fortunately I do not see with the strips of torsades etc. ? ?ECG  ? ?No new EKG today. ? ?Physical Exam  ? ?GEN: Pleasant, somewhat disheveled appearing elderly gentleman.  On initial assessment, he was laying in bed intubated, but awake.   ?Neck: No obvious JVD, right-sided bruit. ?Cardiac: Distant heart sounds, RRR, no M/R/G. ?Respiratory: CTA B, nonlabored,  good air movement. ?GI: Soft/NT/ND/NABS.  No HSM. ?MS: No C/C with mild trivial edema. ?Neuro: Less awake and alert today.  Seen pre and postextubation psych: Normal affect  ? ?Labs  ?  ?High Sensitivity Troponin:   ?Recent Labs  ?Lab 05/17/2021 ?1244 05/04/2021 ?1433 05/25/2021 ?1624 05/16/2021 ?1753 05/30/21 ?0530  ?TROPONINIHS 2,872* 3,964* 1,610* 9,604* 5,164*  ?   ?Chemistry ?Recent Labs  ?Lab 05/18/2021 ?5409 06/21/2021 ?8119 05/30/21 ?0530 05/30/21 ?1478 05/30/21 ?1044 05/30/21 ?1652  ?NA 136   < > 133* 133* 133* 133*  ?K 3.8   < > 4.8 4.3 4.1 4.9  ?CL 105   < > 103  --  105 104  ?CO2 20*   < > 16*  --  20* 19*  ?GLUCOSE 161*   < > 135*  --  186* 145*  ?BUN 24*   < > 33*  --  36* 39*  ?CREATININE 1.85*   < > 2.13*  --  2.33* 2.64*  ?CALCIUM 8.3*   < > 8.4*  --  7.9* 8.1*  ?MG  --   --  2.0  --  2.3  --   ?PROT 6.8  --  6.0*  --   --   --   ?ALBUMIN 3.2*  --  2.4*  --   --   --   ?AST 22  --  25  --   --   --   ?ALT 11  --  11  --   --   --   ?ALKPHOS 87  --  61  --   --   --   ?BILITOT 0.7  --  1.0  --   --   --   ?GFRNONAA 40*   < > 34*  --  30* 26*  ?ANIONGAP 11   < > 14  --  8 10  ? < > = values in this interval not displayed.  ?  ?Lipids  ?Recent Labs  ?Lab 05/10/2021 ?0845  ?CHOL 166  ?TRIG 70  ?HDL 47  ?LDLCALC 105*  ?CHOLHDL 3.5  ?  ?Hematology ?Recent Labs  ?Lab 05/29/21 ?0744 05/30/21 ?0242 05/30/21 ?0530 05/30/21 ?0651  ?WBC 7.4 8.2 8.6  --   ?RBC 3.42* 3.42* 3.59*  --   ?HGB 9.0* 8.9* 9.5* 8.5*  ?HCT 28.1* 28.3* 29.7* 25.0*  ?MCV 82.2 82.7 82.7  --   ?MCH 26.3 26.0 26.5  --   ?MCHC 32.0 31.4 32.0  --   ?RDW 18.2* 18.4* 18.4*  --   ?PLT 153 157 169  --   ? ?Thyroid No results for input(s): TSH, FREET4 in the last 168 hours.  ?BNPNo results for input(s): BNP, PROBNP in the last 168 hours.  ?DDimer No results for input(s): DDIMER in the last 168 hours.  ? ?Radiology  ?  ?DG CHEST PORT 1 VIEW ? ?Result Date: 05/31/2021 ?CLINICAL DATA:  Pneumonia. EXAM: PORTABLE CHEST 1 VIEW COMPARISON:  May 27, 2021. FINDINGS:  Stable cardiomegaly. Sternotomy wires are noted. No acute pulmonary disease is noted. Bony thorax is unremarkable. IMPRESSION: No active disease. Electronically Signed   By: James  Green Jr M.D.   On: 06/19/2021

## 2021-05-31 NOTE — IPAL (Signed)
?  Interdisciplinary Goals of Care Family Meeting ? ? ?Date carried out:: 05/31/2021 ? ?Location of the meeting: Bedside ? ?Member's involved: Physician and Bedside Registered Nurse ? ?Durable Power of Technical sales engineer maker: Vennie Giamanco   ? ?Discussion: We discussed goals of care for Darrell [name] .   ? ?The Clinical status was relayed himself at bedside and patient's wife over the phone in detail. ?  ?Updated and notified of patients medical condition. ?  ?  ?Patient had acute heart attack, he refused cardiac catheterization then he had recurrent ventricular tachycardia that required endotracheal intubation and now he is extubated.  He has refused ICD in the past. ?Explained that now his heart function is poor with this recurrent arrhythmia causing significant problem, his kidney functions are getting worse. ? ?Patient stated he is willing to go for cardiac catheterization once his kidney function improves or when he is appropriate for the procedure. ? ?He stated that he would like to live as long as he can and if his heart stops he would like to continue CPR and endotracheal intubation if requires.   ? ?Code status: Full Code ? ?Disposition: Continue current acute care ? ?  ?Family are satisfied with Plan of action and management. All questions answered ?  ?Jacky Kindle MD ?[facility] ?See Amion for pager ?If no response to pager, please call [phone] until 7pm ?After 7pm, Please call E-link [phone] ? ? ? ?

## 2021-06-01 ENCOUNTER — Inpatient Hospital Stay (HOSPITAL_COMMUNITY): Payer: Medicare Other

## 2021-06-01 DIAGNOSIS — I249 Acute ischemic heart disease, unspecified: Principal | ICD-10-CM | POA: Insufficient documentation

## 2021-06-01 DIAGNOSIS — I420 Dilated cardiomyopathy: Secondary | ICD-10-CM | POA: Diagnosis not present

## 2021-06-01 DIAGNOSIS — I214 Non-ST elevation (NSTEMI) myocardial infarction: Secondary | ICD-10-CM | POA: Diagnosis not present

## 2021-06-01 DIAGNOSIS — N170 Acute kidney failure with tubular necrosis: Secondary | ICD-10-CM

## 2021-06-01 DIAGNOSIS — I5043 Acute on chronic combined systolic (congestive) and diastolic (congestive) heart failure: Secondary | ICD-10-CM

## 2021-06-01 DIAGNOSIS — Z515 Encounter for palliative care: Secondary | ICD-10-CM

## 2021-06-01 LAB — COOXEMETRY PANEL
Carboxyhemoglobin: 1 % (ref 0.5–1.5)
Carboxyhemoglobin: 1 % (ref 0.5–1.5)
Carboxyhemoglobin: 1.1 % (ref 0.5–1.5)
Carboxyhemoglobin: 1.4 % (ref 0.5–1.5)
Methemoglobin: 0.8 % (ref 0.0–1.5)
Methemoglobin: 0.8 % (ref 0.0–1.5)
Methemoglobin: 1 % (ref 0.0–1.5)
Methemoglobin: 1 % (ref 0.0–1.5)
O2 Saturation: 50.6 %
O2 Saturation: 55.5 %
O2 Saturation: 64.8 %
O2 Saturation: 84.2 %
Total hemoglobin: 8.8 g/dL — ABNORMAL LOW (ref 12.0–16.0)
Total hemoglobin: 9.2 g/dL — ABNORMAL LOW (ref 12.0–16.0)
Total hemoglobin: 9.2 g/dL — ABNORMAL LOW (ref 12.0–16.0)
Total hemoglobin: 9.3 g/dL — ABNORMAL LOW (ref 12.0–16.0)

## 2021-06-01 LAB — HEPARIN LEVEL (UNFRACTIONATED)
Heparin Unfractionated: 0.3 IU/mL (ref 0.30–0.70)
Heparin Unfractionated: 0.25 IU/mL — ABNORMAL LOW (ref 0.30–0.70)
Heparin Unfractionated: 0.34 IU/mL (ref 0.30–0.70)

## 2021-06-01 LAB — BASIC METABOLIC PANEL
Anion gap: 13 (ref 5–15)
Anion gap: 11 (ref 5–15)
Anion gap: 14 (ref 5–15)
BUN: 53 mg/dL — ABNORMAL HIGH (ref 8–23)
BUN: 55 mg/dL — ABNORMAL HIGH (ref 8–23)
BUN: 56 mg/dL — ABNORMAL HIGH (ref 8–23)
CO2: 17 mmol/L — ABNORMAL LOW (ref 22–32)
CO2: 21 mmol/L — ABNORMAL LOW (ref 22–32)
CO2: 20 mmol/L — ABNORMAL LOW (ref 22–32)
Calcium: 7.6 mg/dL — ABNORMAL LOW (ref 8.9–10.3)
Calcium: 7.5 mg/dL — ABNORMAL LOW (ref 8.9–10.3)
Calcium: 7.7 mg/dL — ABNORMAL LOW (ref 8.9–10.3)
Chloride: 101 mmol/L (ref 98–111)
Chloride: 100 mmol/L (ref 98–111)
Chloride: 99 mmol/L (ref 98–111)
Creatinine, Ser: 3.71 mg/dL — ABNORMAL HIGH (ref 0.61–1.24)
Creatinine, Ser: 3.87 mg/dL — ABNORMAL HIGH (ref 0.61–1.24)
Creatinine, Ser: 4.06 mg/dL — ABNORMAL HIGH (ref 0.61–1.24)
GFR, Estimated: 17 mL/min — ABNORMAL LOW (ref >60)
GFR, Estimated: 17 mL/min — ABNORMAL LOW (ref >60)
GFR, Estimated: 16 mL/min — ABNORMAL LOW (ref >60)
Glucose, Bld: 251 mg/dL — ABNORMAL HIGH (ref 70–99)
Glucose, Bld: 206 mg/dL — ABNORMAL HIGH (ref 70–99)
Glucose, Bld: 152 mg/dL — ABNORMAL HIGH (ref 70–99)
Potassium: 4.2 mmol/L (ref 3.5–5.1)
Potassium: 3.8 mmol/L (ref 3.5–5.1)
Potassium: 3.8 mmol/L (ref 3.5–5.1)
Sodium: 131 mmol/L — ABNORMAL LOW (ref 135–145)
Sodium: 132 mmol/L — ABNORMAL LOW (ref 135–145)
Sodium: 133 mmol/L — ABNORMAL LOW (ref 135–145)

## 2021-06-01 LAB — POCT I-STAT 7, (LYTES, BLD GAS, ICA,H+H)
Acid-base deficit: 4 mmol/L — ABNORMAL HIGH (ref 0.0–2.0)
Bicarbonate: 19.1 mmol/L — ABNORMAL LOW (ref 20.0–28.0)
Calcium, Ion: 1.08 mmol/L — ABNORMAL LOW (ref 1.15–1.40)
HCT: 27 % — ABNORMAL LOW (ref 39.0–52.0)
Hemoglobin: 9.2 g/dL — ABNORMAL LOW (ref 13.0–17.0)
O2 Saturation: 100 %
Patient temperature: 36.2
Potassium: 4.1 mmol/L (ref 3.5–5.1)
Sodium: 131 mmol/L — ABNORMAL LOW (ref 135–145)
TCO2: 20 mmol/L — ABNORMAL LOW (ref 22–32)
pCO2 arterial: 26.3 mmHg — ABNORMAL LOW (ref 32–48)
pH, Arterial: 7.467 — ABNORMAL HIGH (ref 7.35–7.45)
pO2, Arterial: 269 mmHg — ABNORMAL HIGH (ref 83–108)

## 2021-06-01 LAB — GLUCOSE, CAPILLARY
Glucose-Capillary: 228 mg/dL — ABNORMAL HIGH (ref 70–99)
Glucose-Capillary: 191 mg/dL — ABNORMAL HIGH (ref 70–99)
Glucose-Capillary: 161 mg/dL — ABNORMAL HIGH (ref 70–99)
Glucose-Capillary: 157 mg/dL — ABNORMAL HIGH (ref 70–99)

## 2021-06-01 LAB — ECHOCARDIOGRAM LIMITED
Height: 73 in
MV M vel: 3.88 m/s
MV Peak grad: 60.2 mmHg
P 1/2 time: 333 msec
Radius: 0.7 cm
S' Lateral: 3.9 cm
Weight: 3435.65 oz

## 2021-06-01 LAB — CULTURE, RESPIRATORY W GRAM STAIN
Culture: NORMAL
Gram Stain: NONE SEEN

## 2021-06-01 LAB — HEPATIC FUNCTION PANEL
ALT: 154 U/L — ABNORMAL HIGH (ref 0–44)
AST: 84 U/L — ABNORMAL HIGH (ref 15–41)
Albumin: 2.2 g/dL — ABNORMAL LOW (ref 3.5–5.0)
Alkaline Phosphatase: 107 U/L (ref 38–126)
Bilirubin, Direct: 0.2 mg/dL (ref 0.0–0.2)
Indirect Bilirubin: 0.4 mg/dL (ref 0.3–0.9)
Total Bilirubin: 0.6 mg/dL (ref 0.3–1.2)
Total Protein: 5.6 g/dL — ABNORMAL LOW (ref 6.5–8.1)

## 2021-06-01 LAB — CBC
HCT: 29.1 % — ABNORMAL LOW (ref 39.0–52.0)
Hemoglobin: 9.1 g/dL — ABNORMAL LOW (ref 13.0–17.0)
MCH: 26 pg (ref 26.0–34.0)
MCHC: 31.3 g/dL (ref 30.0–36.0)
MCV: 83.1 fL (ref 80.0–100.0)
Platelets: 183 10*3/uL (ref 150–400)
RBC: 3.5 MIL/uL — ABNORMAL LOW (ref 4.22–5.81)
RDW: 19.3 % — ABNORMAL HIGH (ref 11.5–15.5)
WBC: 9.8 10*3/uL (ref 4.0–10.5)
nRBC: 0.3 % — ABNORMAL HIGH (ref 0.0–0.2)

## 2021-06-01 LAB — BRAIN NATRIURETIC PEPTIDE: B Natriuretic Peptide: 3483.3 pg/mL — ABNORMAL HIGH (ref 0.0–100.0)

## 2021-06-01 LAB — PHOSPHORUS: Phosphorus: 5.3 mg/dL — ABNORMAL HIGH (ref 2.5–4.6)

## 2021-06-01 LAB — MAGNESIUM
Magnesium: 3.1 mg/dL — ABNORMAL HIGH (ref 1.7–2.4)
Magnesium: 3 mg/dL — ABNORMAL HIGH (ref 1.7–2.4)

## 2021-06-01 LAB — LIDOCAINE LEVEL: Lidocaine Lvl: 6.3 ug/mL — ABNORMAL HIGH (ref 1.5–5.0)

## 2021-06-01 MED ORDER — PANTOPRAZOLE 2 MG/ML SUSPENSION
40.0000 mg | Freq: Every day | ORAL | Status: DC
  Administered 2021-06-01 – 2021-06-02 (×2): 40 mg
  Filled 2021-06-01 (×2): qty 20

## 2021-06-01 MED ORDER — VITAL 1.5 CAL PO LIQD
1000.0000 mL | ORAL | Status: DC
  Administered 2021-06-01: 1000 mL

## 2021-06-01 MED ORDER — MAGNESIUM SULFATE 2 GM/50ML IV SOLN
INTRAVENOUS | Status: AC
  Filled 2021-06-01: qty 50

## 2021-06-01 MED ORDER — ATORVASTATIN CALCIUM 80 MG PO TABS
80.0000 mg | ORAL_TABLET | Freq: Every evening | ORAL | Status: DC
  Administered 2021-06-01: 80 mg
  Filled 2021-06-01: qty 1

## 2021-06-01 MED ORDER — FENTANYL CITRATE PF 50 MCG/ML IJ SOSY: 50.0000 ug | PREFILLED_SYRINGE | Freq: Once | INTRAMUSCULAR | Status: DC

## 2021-06-01 MED ORDER — MILRINONE LACTATE IN DEXTROSE 20-5 MG/100ML-% IV SOLN
0.3750 ug/kg/min | INTRAVENOUS | Status: DC
  Administered 2021-06-01 – 2021-06-02 (×2): 0.375 ug/kg/min via INTRAVENOUS
  Filled 2021-06-01 (×2): qty 100

## 2021-06-01 MED ORDER — SENNA 8.6 MG PO TABS
1.0000 | ORAL_TABLET | Freq: Every day | ORAL | Status: DC
  Administered 2021-06-01: 8.6 mg
  Filled 2021-06-01: qty 1

## 2021-06-01 MED ORDER — ROCURONIUM BROMIDE 10 MG/ML (PF) SYRINGE: 80.0000 mg | PREFILLED_SYRINGE | Freq: Once | INTRAVENOUS | Status: AC

## 2021-06-01 MED ORDER — ORAL CARE MOUTH RINSE: 15.0000 mL | OROMUCOSAL | Status: DC

## 2021-06-01 MED ORDER — HYDRALAZINE HCL 25 MG PO TABS
25.0000 mg | ORAL_TABLET | Freq: Three times a day (TID) | ORAL | Status: DC
  Administered 2021-06-01: 25 mg
  Filled 2021-06-01: qty 1

## 2021-06-01 MED ORDER — SODIUM CHLORIDE 0.9 % IV SOLN
2.0000 g | INTRAVENOUS | Status: DC
  Administered 2021-06-01 – 2021-06-02 (×2): 2 g via INTRAVENOUS
  Filled 2021-06-01 (×2): qty 20

## 2021-06-01 MED ORDER — MIDAZOLAM HCL 2 MG/2ML IJ SOLN: 2.0000 mg | INTRAMUSCULAR | Status: DC | PRN

## 2021-06-01 MED ORDER — RENA-VITE PO TABS
1.0000 | ORAL_TABLET | Freq: Every day | ORAL | Status: DC
  Administered 2021-06-01: 1
  Filled 2021-06-01: qty 1

## 2021-06-01 MED ORDER — CALCIUM GLUCONATE-NACL 1-0.675 GM/50ML-% IV SOLN
1.0000 g | Freq: Once | INTRAVENOUS | Status: AC
  Administered 2021-06-01: 1000 mg via INTRAVENOUS
  Filled 2021-06-01: qty 50

## 2021-06-01 MED ORDER — MILRINONE LACTATE IN DEXTROSE 20-5 MG/100ML-% IV SOLN
0.2500 ug/kg/min | INTRAVENOUS | Status: DC
  Administered 2021-06-01: 0.125 ug/kg/min via INTRAVENOUS
  Filled 2021-06-01: qty 100

## 2021-06-01 MED ORDER — CHLORHEXIDINE GLUCONATE 0.12% ORAL RINSE (MEDLINE KIT)
15.0000 mL | Freq: Two times a day (BID) | OROMUCOSAL | Status: DC
  Administered 2021-06-01 – 2021-06-02 (×3): 15 mL via OROMUCOSAL

## 2021-06-01 MED ORDER — POLYETHYLENE GLYCOL 3350 17 G PO PACK
17.0000 g | PACK | Freq: Every day | ORAL | Status: DC
  Administered 2021-06-01 – 2021-06-02 (×2): 17 g
  Filled 2021-06-01 (×2): qty 1

## 2021-06-01 MED ORDER — ACETAMINOPHEN 325 MG PO TABS: 650.0000 mg | ORAL_TABLET | ORAL | Status: DC | PRN

## 2021-06-01 MED ORDER — PROPOFOL 1000 MG/100ML IV EMUL
INTRAVENOUS | Status: AC
  Administered 2021-06-01: 5 ug/kg/min via INTRAVENOUS
  Filled 2021-06-01: qty 100

## 2021-06-01 MED ORDER — FUROSEMIDE 10 MG/ML IJ SOLN
30.0000 mg/h | INTRAVENOUS | Status: DC
  Administered 2021-06-01: 11 mg/h via INTRAVENOUS
  Administered 2021-06-01 – 2021-06-02 (×4): 30 mg/h via INTRAVENOUS
  Filled 2021-06-01 (×7): qty 20

## 2021-06-01 MED ORDER — INSULIN ASPART 100 UNIT/ML IJ SOLN
0.0000 [IU] | INTRAMUSCULAR | Status: DC
  Administered 2021-06-01 (×2): 2 [IU] via SUBCUTANEOUS
  Administered 2021-06-01: 3 [IU] via SUBCUTANEOUS
  Administered 2021-06-01 – 2021-06-02 (×3): 2 [IU] via SUBCUTANEOUS
  Administered 2021-06-02: 3 [IU] via SUBCUTANEOUS
  Administered 2021-06-02: 2 [IU] via SUBCUTANEOUS

## 2021-06-01 MED ORDER — AMIODARONE IV BOLUS ONLY 150 MG/100ML: 150.0000 mg | Freq: Once | INTRAVENOUS | Status: DC

## 2021-06-01 MED ORDER — FENTANYL CITRATE PF 50 MCG/ML IJ SOSY: 100.0000 ug | PREFILLED_SYRINGE | Freq: Once | INTRAMUSCULAR | Status: AC

## 2021-06-01 MED ORDER — ORAL CARE MOUTH RINSE
15.0000 mL | OROMUCOSAL | Status: DC
  Administered 2021-06-01 – 2021-06-02 (×16): 15 mL via OROMUCOSAL

## 2021-06-01 MED ORDER — POTASSIUM CHLORIDE 20 MEQ PO PACK
20.0000 meq | PACK | Freq: Once | ORAL | Status: AC
  Administered 2021-06-01: 20 meq
  Filled 2021-06-01: qty 1

## 2021-06-01 MED ORDER — FENTANYL BOLUS VIA INFUSION
50.0000 ug | INTRAVENOUS | Status: DC | PRN
  Administered 2021-06-02: 100 ug via INTRAVENOUS
  Filled 2021-06-01: qty 100

## 2021-06-01 MED ORDER — ISOSORBIDE DINITRATE 10 MG PO TABS
20.0000 mg | ORAL_TABLET | Freq: Three times a day (TID) | ORAL | Status: DC
  Administered 2021-06-01 – 2021-06-02 (×4): 20 mg
  Filled 2021-06-01 (×4): qty 2

## 2021-06-01 MED ORDER — CLOPIDOGREL BISULFATE 75 MG PO TABS
75.0000 mg | ORAL_TABLET | Freq: Every day | ORAL | Status: DC
  Administered 2021-06-01 – 2021-06-02 (×2): 75 mg
  Filled 2021-06-01 (×2): qty 1

## 2021-06-01 MED ORDER — HYDRALAZINE HCL 20 MG/ML IJ SOLN
10.0000 mg | INTRAMUSCULAR | Status: DC | PRN
  Administered 2021-06-01: 10 mg via INTRAVENOUS
  Filled 2021-06-01: qty 1

## 2021-06-01 MED ORDER — MAGNESIUM SULFATE 2 GM/50ML IV SOLN
2.0000 g | Freq: Once | INTRAVENOUS | Status: AC
  Administered 2021-06-01: 2 g via INTRAVENOUS

## 2021-06-01 MED ORDER — FERROUS SULFATE 300 (60 FE) MG/5ML PO SYRP
300.0000 mg | ORAL_SOLUTION | Freq: Every day | ORAL | Status: DC
  Administered 2021-06-01 – 2021-06-02 (×2): 300 mg
  Filled 2021-06-01 (×2): qty 5

## 2021-06-01 MED ORDER — HYDRALAZINE HCL 10 MG PO TABS
10.0000 mg | ORAL_TABLET | Freq: Three times a day (TID) | ORAL | Status: DC
  Administered 2021-06-01: 10 mg
  Filled 2021-06-01: qty 1

## 2021-06-01 MED ORDER — MIDAZOLAM HCL 2 MG/2ML IJ SOLN: 2.0000 mg | Freq: Once | INTRAMUSCULAR | Status: AC

## 2021-06-01 MED ORDER — ETOMIDATE 2 MG/ML IV SOLN: 15.0000 mg | Freq: Once | INTRAVENOUS | Status: AC

## 2021-06-01 MED ORDER — FUROSEMIDE 10 MG/ML IJ SOLN
100.0000 mg | Freq: Once | INTRAVENOUS | Status: AC
  Administered 2021-06-01: 100 mg via INTRAVENOUS
  Filled 2021-06-01: qty 10

## 2021-06-01 MED ORDER — CHLORHEXIDINE GLUCONATE 0.12% ORAL RINSE (MEDLINE KIT): 15.0000 mL | Freq: Two times a day (BID) | OROMUCOSAL | Status: DC

## 2021-06-01 MED ORDER — PROSOURCE TF PO LIQD
90.0000 mL | Freq: Two times a day (BID) | ORAL | Status: DC
  Administered 2021-06-02 (×2): 90 mL
  Filled 2021-06-01 (×2): qty 90

## 2021-06-01 MED ORDER — FENTANYL 2500MCG IN NS 250ML (10MCG/ML) PREMIX INFUSION
0.0000 ug/h | INTRAVENOUS | Status: DC
  Administered 2021-06-01: 50 ug/h via INTRAVENOUS
  Administered 2021-06-02: 100 ug/h via INTRAVENOUS
  Filled 2021-06-01 (×2): qty 250

## 2021-06-01 MED ORDER — AMIODARONE LOAD VIA INFUSION
150.0000 mg | Freq: Once | INTRAVENOUS | Status: AC
  Administered 2021-06-01: 150 mg via INTRAVENOUS
  Filled 2021-06-01: qty 83.34

## 2021-06-01 MED ORDER — PROPOFOL 1000 MG/100ML IV EMUL
5.0000 ug/kg/min | INTRAVENOUS | Status: DC
  Administered 2021-06-02 (×2): 30 ug/kg/min via INTRAVENOUS
  Filled 2021-06-01 (×4): qty 100

## 2021-06-01 MED ORDER — DOCUSATE SODIUM 50 MG/5ML PO LIQD
100.0000 mg | Freq: Two times a day (BID) | ORAL | Status: DC
  Administered 2021-06-01 – 2021-06-02 (×4): 100 mg
  Filled 2021-06-01 (×4): qty 10

## 2021-06-01 NOTE — Progress Notes (Signed)
TRH Sign off note ? ?Patient was supposed to Xfer to [facility] today. However now intubated and back in ICU. TRH will sign off. Please call back when stabilized for transfer. Thanks ? ?Communicated with Dr. Ina Homes, PCCM MD. ? ?Vernell Leep, MD,  ?FACP, [facility], [facility], [facility] (Care Management Physician Certified) ?[facility] Physician Advisor ?[place] ? ?To contact the attending provider between 7A-7P or the covering provider during after hours 7P-7A, please log into the web site [email or web address] and access using universal [place] password for that web site. If you do not have the password, please call the hospital operator. ? ?

## 2021-06-01 NOTE — Plan of Care (Signed)

## 2021-06-01 NOTE — Consult Note (Signed)
?Palliative Medicine Inpatient Consult Note ? ?Consulting Provider: Ledora [name], [place] ? ?Reason for consult:   ?[place]rell [name] Palliative Medicine Consult  ?Reason for Consult? CAD, VT, AKI  ? ?HPI:  ?Per intake H&P --> 66 year old male with coronary artery disease s/p CABG, prior history of VT arrest who initially presented with chest pain, noted to have acute non-ST elevation MI, medically managed as patient refused cardiac catheterization. ?On 5/3 in early morning hours patient was noted to go into ventricular tachycardia and then torsade, he became confused, rapid response was called, he was given magnesium, IV amiodarone, he was intubated and was transferred to ICU.  Goals of care conversations have been had with the CCM team and patient has been clear about wanting all interventions to prolong life. ? ?Palliative care has been requested to further discuss patients chronic disease processes. ? ?Clinical Assessment/Goals of Care: ? ?*Please note that this is a verbal dictation therefore any spelling or grammatical errors are due to the "[place] [facility]" system interpretation. ? ?I have reviewed medical records including EPIC notes, labs and imaging, received report from bedside RN, assessed the patient who is intubated able to nod his head when questions are being asked as well as follow directions. ?  ?I called patient's spouse, Darrell [name] to further discuss diagnosis prognosis, GOC, EOL wishes, disposition and options. ?  ?I introduced Palliative Medicine as specialized medical care for people living with serious illness. It focuses on providing relief from the symptoms and stress of a serious illness. The goal is to improve quality of life for both the patient and the family. ? ?Medical History Review and Understanding: ? ?Darrell [name] has a h/o CKD, CAD, DM2, HTN, HLD, and diastolic dysfunction.  ? ?Reviewed Darrell [name]'s prior cardiac catheterization and PCIs. ? ?Discussed patients present ICU  course and what is complicating it.  ? ?Social History: ? ?[name] He has been married to his wife, Darrell [name] for the past 38 years. They have three children, two daughters and one son as well as four grandchildren. He has not worked since 2008. He is a "laid back" man and enjoys sports, television, and mystery gaming. He is a man of faith and practices within the [employer] denomination.  ? ?Functional and Nutritional State: ? ?Prior to admission he was able to mobilize.  His wife shares that he had difficulty with getting in and out of the bathtub as it is not very large.  He was able to dress himself.  He does not use a cane or a walker. ? ?Patient has had a fair appetite prior to admission ? ?Advance Directives: ? ?A detailed discussion was had today regarding advanced directives.  Patient's wife, Darrell [name] would be his primary decision maker if he were unable to make decisions for himself.  Darrell [name] shares that she rely heavily on family if difficult decisions needed to be made and Darrell [name] was unable to participate in these. ? ?Code Status: ? ?Per patient's wife Darrell [name] is clear about wishing to be full code and have all interventions pursued to maintain life at this point in time. ? ?Discussion: ? ?A formal discussion of [place] active medical issues was held inclusive of his non-ST elevation MI, coronary artery disease, type 2 diabetes, kidney disease, diastolic heart failure. ? ?Reviewed the importance of ongoing discussions regarding patient's health state as he has multiple comorbid conditions and is identified presently is critically ill.  We discussed that moving forward it may behoove  Darrell [name] to gather her family for more formalized discussion.  Darrell [name] had a hard time comprehending the information provided therefore she agrees that gathering her children to meet with the medical team may be of benefit. ? ?The idea of hemodialysis came up for patients kidney disease.  It  does not appear that patient's wife understands the use of hemodialysis at this time.  We will continue conversations with both Darrell [name] and Darrell [name] in the oncoming days to further identify goals of care. ? ?Discussed the importance of continued conversation with family and their  medical providers regarding overall plan of care and treatment options, ensuring decisions are within the context of the patients values and GOCs. ? ?Decision Maker: ?Darrell [name] (spouse) [phone] ? ?SUMMARY OF RECOMMENDATIONS   ?Full Code/ Full Scope of Care  ? ?Ongoing conversations presently patient and family would like for his medical conditions to be optimized. Have recommended setting up a family meeting to help with patient and families understanding of his acute on chronic medical conditions ? ?Goals are for improvements ? ?Ongoing Palliative Support ? ?Code Status/Advance Care Planning: ?FULL CODE ?  ?Palliative Prophylaxis:  ?Aspiration, Bowel Regimen, Delirium Protocol, Frequent Pain Assessment, Oral Care, Palliative Wound Care, and Turn Reposition ? ?Additional Recommendations (Limitations, Scope, Preferences): ?Avoid Hospitalization, Full Scope Treatment, No Artificial Feeding, No Surgical Procedures, and No Tracheostomy ? ?Psycho-social/Spiritual:  ?Desire for further Chaplaincy support: Yes ?Additional Recommendations: Education on acute disease processes ?  ?Prognosis: Poor overall - very high 12 month mortality risk ? ?Discharge Planning: Discharge plan is undetermined at this time.  ? ?Vitals:  ? 06/01/21 0600 06/01/21 0615  ?BP:    ?Pulse: (!) 57 (!) 55  ?Resp: 18 16  ?Temp: (!) 97.5 ?F (36.4 ?C) 97.7 ?F (36.5 ?C)  ?SpO2: 97% 98%  ? ? ?Intake/Output Summary (Last 24 hours) at 06/01/2021 0645 ?Last data filed at 06/01/2021 0600 ?Gross per 24 hour  ?Intake 1601.82 ml  ?Output 248 ml  ?Net 1353.82 ml  ? ?Last Weight  Most recent update: 06/01/2021  2:23 AM  ? ? Weight  ?97.4 kg (214 lb 11.7 oz)  ?      ? ?  ? ?Gen: Frail  elderly Caucasian male ?HEENT: ETT, OG tube, dry mucous membranes ?CV: Regular rate and rhythm ?PULM: On mechanical ventilator ?ABD: soft/nontender ?EXT: Pedal edema present ?Neuro: Alert can nod and shake head ? ?PPS: 10% ? ? ?This conversation/these recommendations were discussed with patient primary care team, Dr. Tamala Julian ? ?MDM High ? ?Medical Decision Making: ?#/Complex Problems:                      ?Data Reviewed:                 ?Management: ?(1-Straightforward, 2-Low, 3-Moderate, 4-High) ?______________________________________________________ ?Darrell [name] ?[place] Team ?Team Cell Phone: [phone] ?Please utilize secure chat with additional questions, if there is no response within 30 minutes please call the above phone number ? ?Palliative Medicine Team providers are available by phone from 7am to 7pm daily and can be reached through the team cell phone.  ?Should this patient require assistance outside of these hours, please call the patient's attending physician. ? ? ?

## 2021-06-01 NOTE — Progress Notes (Addendum)
? ?Progress Note ? ?Patient Name: Darrell [name] ?Date of Encounter: 06/01/2021 ? ?[facility] Cardiologist: Minus Breeding, MD  ? ?Patient Profile  ?   ?66 y.o. male with a PMH of CAD s/p CABG in 2009 with subsequent PCI's, HTN, HLD, DM type II, CKD stage IIIb who is being seen 04/29/2021 for the evaluation of NSTEMI at the request of Dr. Roslynn Amble. ? ?Has been doing well with medical management.  Initially declined cardiac catheterization.  Had completed 72 hours of IV heparin, was weaned off IV nitroglycerin and converted to p.o. meds. ?On the evening prior to planned discharge, I had apparent brief cardiac arrest with torsade de pointes.  Rapid ROSC, but intubated overnight on 5 2-05/30/2021.  Extubated (05/30/2021) ?Renal function worsening with reduced urine output. ? ? ?Assessment & Plan  ?  ?Principal Problem: ?  NSTEMI (non-ST elevated myocardial infarction) ([place]) ?Active Problems: ?  Coronary artery disease involving native coronary artery of native heart with unstable angina pectoris ([place]) ?  Hypertensive emergency ?  Ischemic cardiomyopathy ?  Torsades de pointes ([place]) ?  Hyperlipidemia with target LDL less than 70 ?  history of VT arrest  ?  Acute renal failure superimposed on stage 3b chronic kidney disease ([place]) ?  Normocytic anemia ?  PNA (pneumonia) ?  Acute on chronic combined systolic and diastolic CHF (congestive heart failure) ([place]) ? ?Diabetes mellitus type II, non insulin dependent ([place]) ?Well controlled, A1C of 5.2 ?Hold metformin while inpatient ?SSI and accuchecks qac/hs  ? ?history of VT arrest  ?Hx of VT arrest in 10/2020 ?Discussion with EP at that time of ICD placement; however, after discussion over risks/benefits patient declined and was continued on amiodarone which he does not appear to be taking on regular basis ?Has not followed up with cardiology since this time  ?Will continue telemetry/amiodarone.  ? ?Acute renal failure superimposed on stage 3b chronic kidney disease  ([place]) ?Baseline creatinine of 1.8-2.0 ?Stable, continue to monitor  ? ?Hypertensive emergency ?Hypertensive emergency in setting of possible medical noncompliance. Unsure how much this is contributing to NSTEMI ?Continue coreg and norvasc. Have room to titrate up his coreg if needed and heart rate allows ?On nitro drip for chest pain and will wean as tolerated ?PRN IV parameters will be placed  ? ?NSTEMI (non-ST elevated myocardial infarction) ([place]) ?66 year old with known CAD s/p CABG with severe 3 vessel CAD presenting with chest pain, HTN emergency and NSTEMI ?-obs to progressive ?-ASA given with EMS ?-troponin [id number]. Trend to peak  ?-heparin gtt ?-nitro gtt for chest pain, wean as tolerated  ?-cardiology consulted with plans for [facility] tomorrow ?-echo pending  ?-continue statin, coreg and aspirin  ?-last LHC in 10/2020 with severe native 3 vessel CAD and following findings:  ?  Total occlusion of the LAD with severe diffuse disease beyond the LIMA insertion ? Total occlusion of the LCx ? Total occlusion of the RCA, distal branches collateralized by septal perforator branches of the LAD visualized on the  LIMA injections ?-no targets for PCI and recommended medical therapy  ? ?Right carotid bruit ?No hx of [place] with poor f/u ?Check carotid doppler--->80-99% right ICA stenosis and left 60-79% stenosis. Discussed with on call vascular and they recommended outpatient f/u, continued medical therapy.  ?Continue ASA/statin  ? ?Normocytic anemia ?Hx of anemia with hx of CKD ?Will check iron studies and monitor  ?Encourage colon cancer screening outpatient  ? ?NSTEMI (non-ST elevated myocardial infarction) ([place]) ?Presentation with significant ST changes  with ST depressions in the setting of ongoing chest pain, notable troponin elevation consistent with non-STEMI presentation.  Troponins increased over 9000. ?-> Initiated on early conservative management with IV heparin and IV nitroglycerin with plans for cardiac  catheterization on 06/12/2021, however the patient has chosen to consider continued conservative/noninvasive approach with medical management only.  Would prefer to avoid catheterization. ? ?Echocardiogram shows relatively stable findings with perhaps mildly reduced overall function. ?Initially declined cardiac catheterization, therefore mutual agreement made to continue aggressive medical therapy. => Completed 72 hours of IV heparin. ->  Off IV nitroglycerin and converted to Imdur.  Restarting today. ? ?Unfortunately, initial plans for cardiac catheterization earlier this week did not happen because he declined catheterization indicating concerns about previous issues with hematoma.  He also indicated that in the past we were not able to intervene. ?Initially treated with 72 hours of IV heparin.  Unfortunately shortly after stopping IV heparin, he had an episode of VT leading to loss of consciousness and intubation.  Relatively quickly recovered from this but subsequently again had another run of monomorphic VT.  Very concerning for ischemic changes.  Interestingly, he is not having angina associated with this. ? ?Plan for now will be to continue medical management on heparin after the second run of VT and intubation.  Unfortunately with worsening renal function, unable to perform cath. ? ?Hypertensive emergency ?Significantly elevated blood pressure initially on arrival. ? ?Now stabilized. ?On carvedilol 12.5 mg daily along with hydralazine/nitrate. ? ?Not on high-dose amlodipine-for microvascular ischemia, we may want to restart once stabilized. ? ?Using hydralazine/nitrate combination as opposed to [name]/ACE-I due to worsening renal function (in setting of stage IIIb CKD.[place]) ? ?Coronary artery disease involving native coronary artery of native heart with unstable angina pectoris ([place]) ?Presented with unstable angina/non-STEMI.  He is opted for aggressive medical management, and to avoid cardiac  catheterization. ? ?Known significant negative and graft CAD with patent SVG-OM and LIMA-LAD 2 to almost atretic LAD.  Native vessel occluded with SVG-RCA and SVG-diagonal both occluded.  Very unfavorable PCI options.  Essentially unrevealing notable decent distal vessel as the OM branch was grafted by the SVG. ?Patient initially declined cardiac catheterization.  Now he is interested in doing so.  Unfortunately renal function is an issue.  See below. ?Unfortunately, he is now suffered 2 separate episodes of symptomatic ventricular tachycardia-last night a prolonged episode leading to loss of pulse and intubation.  He had ROSC after CPR without shock. ?- Monomorphic VT - quite likely Ischemic VT -- would like to assess with Cath, and he would be agreeable => unfortunately, with worsening renal function, this plan has been delayed.   ?Plan:  ?On aspirin and Plavix. ?High-dose statin. ? ?Unfortunately, now with worsening renal function, cath is prohibitive risk.  Would like to see if there could be some return renal function. ?Would continue on IV heparin for now until his renal fxn trend is determined.  ? ? ?Diabetes mellitus type II, non insulin dependent ([place]) ?Managed by [facility] hospitalist.  Metformin on hold.  Sliding scale, would be okay ? ?history of VT arrest  ?In the past had VT arrest, but not described as torsades.   ?Has now had a second run of VT with the last night's event being much more prominent.  Is now back on IV amiodarone drip along with IV lidocaine.  We are reducing the dose of lidocaine to 1/h.  Continue to monitor levels.  EP was consulted.  Currently not an ICD candidate,  nor but he potentially be. ?For now continue amiodarone and lidocaine.  EP will determine timing of discontinuation of lidocaine. ? ?Ultimately needs an ischemic evaluation, but unfortunately renal function is worsening.  Unless the the patient would be a candidate for long-term dialysis, would not recommend catheterization  at this point. ? ? ?Hyperlipidemia with target LDL less than 70 ?Continue high-dose statin.  If he survives this, would probably benefit from more aggressive therapy. ? ?Acute renal failure superimposed on stage 3b chronic kidney disease ([place])

## 2021-06-01 NOTE — Plan of Care (Signed)
Patient had another episode of stable VT that required synchronized cardioversion to NSR. Bolus of amio given and gtt increased to 60mg /hr. HR 60s. Will hold off on increasing lidocaine for now. BMP mag and ECG. Will also give mag.  ?

## 2021-06-01 NOTE — Progress Notes (Signed)
Initial Nutrition Assessment ? ?DOCUMENTATION CODES:  ? ?Not applicable ? ?INTERVENTION:  ? ?Tube Feeding via OG:  ?Vital 1.5 at 55 ml/hr ?Begin at 20 ml/hr; titrate by 10 mL q 8 hours until goal rate of 55 ml/hr ?Pro-Source 90 mL BID ?This provides 133 g of protein, 2140 kcals and 1003 mL of free water ? ?Add Renal MVI in setting of AKI on CKD with possible RRT ? ?If constipation persists post initiation of TF, recommend considering more aggressive bowel regimen ? ?NUTRITION DIAGNOSIS:  ? ?Inadequate oral intake related to acute illness as evidenced by NPO status. ? ?GOAL:  ? ?Patient will meet greater than or equal to 90% of their needs ? ?MONITOR:  ? ?Vent status, Labs, Weight trends, TF tolerance ? ?REASON FOR ASSESSMENT:  ? ?Ventilator ?  ? ?ASSESSMENT:  ? ?66 yo male admitted with acute NSTEMI, recurrent VT, AKI on CKD. PMH includes CAD s/p CABG, CKD, DM, HTN ? ?5/02 Admitted ?5/05 Intubated, recurrent VT arrest ? ?Pt remains on vent support, awake, weaned off dopamine. Pt on lidocaine and amio gtt, lasix gtt ? ?Recorded po intake 30-50% of meals prior to intubation. Unable to obtain diet and weight history from patient at this time ? ?Admit weight charted as 101.6 kg; now follow-up weight until today. Current wt 97.4 kg, pt with edema on exam, dry weight unknown ? ?Worsening renal function, noted possible need for RRT ? ?+constipation, on bowel regimen.  ? ?Labs: sodium 132 (L), Creatinine 3.87, BUN 55 ?Meds: lasix gtt, ferrous sulfate, ss novolog, colace, miralax ? ? ?NUTRITION - FOCUSED PHYSICAL EXAM: ? ?Flowsheet Row Most Recent Value  ?Orbital Region No depletion  ?Upper Arm Region No depletion  ?Thoracic and Lumbar Region No depletion  ?Buccal Region Unable to assess  ?Temple Region No depletion  ?Clavicle Bone Region No depletion  ?Clavicle and Acromion Bone Region No depletion  ?Scapular Bone Region No depletion  ?Dorsal Hand Unable to assess  ?Patellar Region Unable to assess  ?Anterior Thigh Region  Unable to assess  ?Posterior Calf Region Unable to assess  ?Edema (RD Assessment) Moderate  ? ?  ? ? ?Diet Order:   ?Diet Order   ? ?       ?  Diet NPO time specified  Diet effective now       ?  ? ?  ?  ? ?  ? ? ?EDUCATION NEEDS:  ? ?Not appropriate for education at this time ? ?Skin:  Skin Assessment: Reviewed RN Assessment ? ?Last BM:  PTA ? ?Height:  ? ?Ht Readings from Last 1 Encounters:  ?05/18/2021 6\' 1"  (1.854 m)  ? ? ?Weight:  ? ?Wt Readings from Last 1 Encounters:  ?06/01/21 97.4 kg  ? ? ? ?BMI:  Body mass index is 28.33 kg/m?. ? ?Estimated Nutritional Needs:  ? ?Kcal:  2100-2300 kcals ? ?Protein:  125-140 g ? ?Fluid:  1.8 L ? ? ?08/01/21 MS, RDN, LDN, CNSC ?Registered Dietitian III ?Clinical Nutrition ?RD Pager and On-Call Pager Number Located in [place]  ? ?

## 2021-06-01 NOTE — Consult Note (Addendum)
? ?ELECTROPHYSIOLOGY CONSULT NOTE  ? ? ?Patient ID: Darrell [name] ?MRN: [id number], DOB/AGE: 65-29-1957 65 y.o. ? ?Admit date: 05/18/2021 ?Date of Consult: 06/01/2021 ? ?Primary Physician: [name], MD ?Primary Cardiologist: Minus Breeding, MD  ?Electrophysiologist: Dr. Curt Bears ? ?Referring Provider: Dr. Tamala Julian ? ?Patient Profile: ?Darrell [name] is a 66 y.o. male with a history of CAD s/p CABG in 2009 with subsequent PCI's, HTN, HLD, DM type II, CKD stage IIIb who is being seen 05/22/2021 for the evaluation of VF arrest at the request of Dr. Tamala Julian. ? ?HPI:  ?Darrell [name] is a 66 y.o. male with medical history as above.  ? ?Pt had NSTEMI in 09/2020. Cath with no amenable targets. EP saw for secondary prevention ICD consideration and pt refused. Discharged on amiodarone.  ? ?He presented to the ED 05/09/2021 with chest pain. STEMI initially called but cancelled. Pt complained of sudden onset of 9/10 substernal chest pain after waking. Associated with diaphoresis but no shortness of breath, nausea, vomiting, dizziness, lightheadedness, or syncope. He reported being overall stable since his hospitalization 10/2020.  He had not followed up with cardiology in that interim, and was only intermittently compliant with his medications per report.  ?  ?Vitals with markedly elevated blood pressure to 203/105 on presentation, minimally improved to 180s/90s, intermittent tachycardia to the 110s. Labs notable for creatinine 1.8 (baseline), electrolytes WNL, HsTrop 1515, Hgb 11.6, PLT 221, A1c 5.2, LDL 105, influenza/COVID-19 negative.  CXR without acute findings. EKG with sinus tachycardia, rate 109 bpm, PACs, nonspecific IVCD, lateral ST, no STE, nonspecific ST-T wave abnormalities. ? ?He declined cardiac cath and completed 72 hrs of IV heparin; then weaned off IV NTG and to po meds. On the evening prior to discharge he had brief cardiac arrest with torsades. Rapid ROSC but intubated overnight on 5/2 - 05/30/2021.  Extubated 05/30/2021.  ? ?Renal function trending up 2.3 -> 2.6 -> 2.9 -> 3.6 -> 3.7. ? ?Pt had recurrent VF arrest last night.  ? ?Started on lidocaine and amio gtt. Lidocaine bolused overnight. ?Started on lasix gtt with HF and elevated JVD ?Started on dopamine to keep HR > 55.  ? ?Dopamine weaned off this am.  ?Lasix gtt increased to 30 mg/hr by CCM with poor urine output.  ? ?He is awake on vent. Nods to questioning and dozes off.  ? ?ROS: Patient is encephalopathic and/or intubated. History has been obtained from chart review.  ? ?Past Medical History:  ?Diagnosis Date  ? CKD (chronic kidney disease), stage III ([place])   ? Claudication [facility])   ? Coronary artery disease   ? a. 08/2001 s/p DES to RCA/RI;  b. 07/2006 s/p DES to p/d LCX;  c. 11/2007 CABGx5 (LIMA->LAD, VG->D1, LRA->OM1, VG->PDA->LPL); c. 03/2015: Synergy DES to mid-Cx in the setting of NSTEMI; d. 11/2015 NSTEMI->Med Rx. e. NSTEMI 01/2018 -> med rx.  ? Diabetes mellitus type 2 in nonobese [facility])   ? a. A1c 7.0 0/0867.  ? Diastolic dysfunction   ? a. 02/2016 Echo: EF 50-55%, gr1DD, Ao sclerosis, dil Ao root (21mm), sev dil LA, triv TR, PASP 66mmHg.  ? Hyperlipidemia   ? Hypertension   ? Noncompliance   ? Right carotid bruit   ? a. noted 01/2018, Edmund Rick need OP eval when patient complies with follow-up.  ?  ? ?Surgical History:  ?Past Surgical History:  ?Procedure Laterality Date  ? CARDIAC CATHETERIZATION N/A 04/06/2015  ? Procedure: Left Heart Cath and Cors/Grafts Angiography;  Surgeon: Lorretta Harp, MD;  Location: [place] CV LAB;  Service: Cardiovascular;  Laterality: N/A;  ? CARDIAC CATHETERIZATION N/A 04/06/2015  ? Procedure: Coronary Stent Intervention;  Surgeon: Lorretta Harp, MD;  2.5 mm x 12 mm long Synergy DES followed by  2.5 mm x 16 mm long Synergy DES    ? CORONARY ARTERY BYPASS GRAFT  2009  ? 2009 LIMA to LAD, SVG to Diag, SVG to PDA and PL, left radial to OM  ? CORONARY BALLOON ANGIOPLASTY N/A 05/04/2018  ? Procedure: CORONARY BALLOON  ANGIOPLASTY;  Surgeon: Lorretta Harp, MD;  Location: [place] CV LAB;  Service: Cardiovascular;  Laterality: N/A;  Prox CFX  ? LEFT HEART CATH N/A 10/24/2012  ? Procedure: LEFT HEART CATH;  Surgeon: Troy Sine, MD;  Location: [facility] CATH LAB;  Service: Cardiovascular;  Laterality: N/A;  ? LEFT HEART CATH AND CORS/GRAFTS ANGIOGRAPHY N/A 05/04/2018  ? Procedure: LEFT HEART CATH AND CORS/GRAFTS ANGIOGRAPHY;  Surgeon: Lorretta Harp, MD;  Location: [place] CV LAB;  Service: Cardiovascular;  Laterality: N/A;  ? LEFT HEART CATH AND CORS/GRAFTS ANGIOGRAPHY N/A 11/01/2020  ? Procedure: LEFT HEART CATH AND CORS/GRAFTS ANGIOGRAPHY;  Surgeon: Sherren Mocha, MD;  Location: [place] CV LAB;  Service: Cardiovascular;  Laterality: N/A;  ?  ? ?Medications Prior to Admission  ?Medication Sig Dispense Refill Last Dose  ? amiodarone (PACERONE) 200 MG tablet Take 1 tablet (200 mg total) by mouth daily for 5 days. 5 tablet 0 Past Week  ? amLODipine (NORVASC) 10 MG tablet Take 1 tablet (10 mg total) by mouth daily. Please make PCP appointment. 30 tablet 0 Past Week  ? aspirin 81 MG EC tablet Take 1 tablet (81 mg total) by mouth daily. Swallow whole. 30 tablet 1 05/15/2021  ? atorvastatin (LIPITOR) 80 MG tablet Take 1 tablet (80 mg total) by mouth every evening. 30 tablet 0 Past Week  ? carvedilol (COREG) 6.25 MG tablet Take 1 tablet (6.25 mg total) by mouth 2 (two) times daily with a meal. 28 tablet 0 Past Week  ? metFORMIN (GLUCOPHAGE) 500 MG tablet Take 1 tablet (500 mg total) by mouth daily with breakfast. 30 tablet 1   ? nitroGLYCERIN (NITROSTAT) 0.4 MG SL tablet PLACE 1 TABLET UNDER THE TONGUE EVERY 5 MINUTES AS NEEDED FOR CHEST PAIN (UP TO 3 DOSES. IF TAKING 3RD DOSE CALL 911) (Patient not taking: Reported on 05/15/2021) 25 tablet 2 Not Taking  ? ? ?Inpatient Medications:  ? atorvastatin  80 mg Per Tube QPM  ? chlorhexidine gluconate (MEDLINE KIT)  15 mL Mouth Rinse BID  ? Chlorhexidine Gluconate Cloth  6 each  Topical Daily  ? clopidogrel  75 mg Per Tube Daily  ? docusate  100 mg Per Tube BID  ? fentaNYL (SUBLIMAZE) injection  50 mcg Intravenous Once  ? ferrous sulfate  300 mg Per Tube Daily  ? insulin aspart  0-9 Units Subcutaneous Q4H  ? isosorbide dinitrate  20 mg Per Tube TID  ? mouth rinse  15 mL Mouth Rinse 10 times per day  ? pantoprazole sodium  40 mg Per Tube Daily  ? polyethylene glycol  17 g Per Tube Daily  ? senna  1 tablet Per Tube QHS  ? sodium chloride flush  3 mL Intravenous Q12H  ? sodium chloride flush  3 mL Intravenous Q12H  ? ? ?Allergies: No Known Allergies ? ?Social History  ? ?Socioeconomic History  ? Marital status: Married  ?  Spouse name: Not on file  ?  Number of children: 3  ? Years of education: Not on file  ? Highest education level: Not on file  ?Occupational History  ? Not on file  ?Tobacco Use  ? Smoking status: Never  ? Smokeless tobacco: Never  ?Vaping Use  ? Vaping Use: Never used  ?Substance and Sexual Activity  ? Alcohol use: No  ?  Comment: sts he drank for the first time in a long time last night   ? Drug use: No  ? Sexual activity: Not on file  ?Other Topics Concern  ? Not on file  ?Social History Narrative  ? Lives at home with wife.    ? ?Social Determinants of Health  ? ?Financial Resource Strain: Not on file  ?Food Insecurity: Not on file  ?Transportation Needs: Not on file  ?Physical Activity: Not on file  ?Stress: Not on file  ?Social Connections: Not on file  ?Intimate Partner Violence: Not on file  ?  ? ?Family History  ?Problem Relation Age of Onset  ? Heart failure Mother   ? Cancer Mother   ? CAD Mother 36  ? Heart failure Father   ? Kidney failure Brother   ?  ? ?Review of Systems: ?All other systems reviewed and are otherwise negative except as noted above. ? ?Physical Exam: ?Vitals:  ? 06/01/21 0630 06/01/21 0645 06/01/21 0700 06/01/21 0730  ?BP:      ?Pulse: (!) 54 (!) 54 (!) 55 68  ?Resp: (!) $RemoveBe'8 14 16 'JMFulFwBE$ (!) 22  ?Temp: 97.7 ?F (36.5 ?C) 97.7 ?F (36.5 ?C) 97.7 ?F (36.5  ?C)   ?TempSrc:      ?SpO2: 99% 99% 99% 93%  ?Weight:      ?Height:      ? ?GEN- The patient is intubated and sedated. Responds to voice.  ?HEENT- + ET tube. JVP 9-10 + and bounding ?Lungs- +mechanical breathing sou

## 2021-06-01 NOTE — Progress Notes (Addendum)
ANTICOAGULATION CONSULT NOTE - Follow Up Consult ? ?Pharmacy Consult for heparin infusion ?Indication: chest pain/ACS ? ?No Known Allergies ? ?Patient Measurements: ?Height: 6\' 1"  (185.4 cm) ?Weight: 97.4 kg (214 lb 11.7 oz) ?IBW/kg (Calculated) : 79.9 ? ?Vital Signs: ?Temp: 97.7 ?F (36.5 ?C) (05/05 0700) ?Temp Source: Bladder (05/05 0300) ?BP: 131/47 (05/05 0324) ?Pulse Rate: 55 (05/05 0700) ? ?Labs: ?Recent Labs  ?  05/29/21 ?[id number] 05/30/21 ?0242 05/30/21 ?0530 05/30/21 ?[id number] 05/31/21 ?[id number] 05/31/21 ?1946 05/31/21 ?2200 06/01/21 ?[id number] 06/01/21 ?0245  ?HGB 9.0* 8.9* 9.5*   < >  --  8.9*  --  9.2* 9.1*  ?HCT 28.1* 28.3* 29.7*   < >  --  28.8*  --  27.0* 29.1*  ?PLT 153 157 169  --   --  164  --   --  183  ?HEPARINUNFRC 0.49 <0.10*  --   --   --   --   --   --  0.30  ?CREATININE  --   --  2.13*   < > 2.94* 3.57*  --   --  3.71*  ?TROPONINIHS  --   --  [id number]*  --   --  3,599* 3,738*  --   --   ? < > = values in this interval not displayed.  ? ? ?Estimated Creatinine Clearance: 24.7 mL/min (A) (by C-G formula based on SCr of 3.71 mg/dL (H)). ? ?Assessment: ?66 yo male admitted with NSTEMI, now s/p cardiac arrest on 05/31/21 due to VT/TdP. Pharmacy has been consulted to manage heparin infusion. ? ?Heparin level this morning was 0.3, but it was drawn early (~6h from bolus). Repeat heparin level drawn at a more appropriate time compared to bolus was subtherapeutic at 0.25. CBC is stable. Per RN, patient had some oozing from IV site overnight but this has since resolved.  ? ?Goal of Therapy:  ?Heparin level 0.3-0.7 units/ml ?Monitor platelets by anticoagulation protocol: Yes ?  ?Plan:  ?Increase to heparin 1500 units/hr ?Heparin level in 6h  ?Daily CBC, heparin level ?Monitor for s/sx of bleeding ? ?Thank you for including pharmacy in the care of this patient. ? ?Zenaida Deed, PharmD ?PGY1 Acute Care Pharmacy Resident  ?Phone: [phone] ?06/01/2021  7:47 AM ? ?Please check [email or web address] for unit-specific pharmacy phone  numbers. ? ? ? ?

## 2021-06-01 NOTE — Consult Note (Signed)
Reason for Consult: Acute kidney injury on chronic kidney disease stage IIIb ?Referring Physician: Glenetta Hew, MD (cardiology) ? ?HPI: (History obtained from chart, patient intubated and wife unreachable on phone) ?66 year old man with past medical history significant for coronary artery disease status post CABG in 2009 with subsequent PCIs, hypertension, type 2 diabetes mellitus, dyslipidemia and chronic kidney disease stage IIIb (baseline creatinine 1.8-2.0) who presented to the emergency room with complaints of substernal chest pain 5 days ago.  He was treated for NSTEMI and declined coronary angiogram because his last one 6 months ago showed diffuse disease without targets for PCI with recommendations for medical management.  On 5/3 he had ventricular tachycardia arrest and then torsade de point with activation of a CODE BLUE following which he was intubated for airway protection (altered mental status) and transferred to the ICU. ? ?Nephrology is consulted because of worsening renal function; creatinine that was elevated on admission at 2.3 has gradually risen to 3.7 with decreasing urine output in the setting of recurrent ventricular fibrillation arrest.  He has been refractory to furosemide infusion and has been oliguric overnight. ? ?From my discussion with Dr. Ellyn [name] and review of the palliative care note available, his baseline functional status appears to be quite limited.  I attempted to call his wife Darrell [name] ([phone]) but only go to her voicemail with inability to leave voicemail. ? ?Past Medical History:  ?Diagnosis Date  ? CKD (chronic kidney disease), stage III ([place])   ? Claudication [facility])   ? Coronary artery disease   ? a. 08/2001 s/p DES to RCA/RI;  b. 07/2006 s/p DES to p/d LCX;  c. 11/2007 CABGx5 (LIMA->LAD, VG->D1, LRA->OM1, VG->PDA->LPL); c. 03/2015: Synergy DES to mid-Cx in the setting of NSTEMI; d. 11/2015 NSTEMI->Med Rx. e. NSTEMI 01/2018 -> med rx.  ? Diabetes mellitus type 2 in  nonobese [facility])   ? a. A1c 7.0 01/1019.  ? Diastolic dysfunction   ? a. 02/2016 Echo: EF 50-55%, gr1DD, Ao sclerosis, dil Ao root (63mm), sev dil LA, triv TR, PASP 50mmHg.  ? Hyperlipidemia   ? Hypertension   ? Noncompliance   ? Right carotid bruit   ? a. noted 01/2018, will need OP eval when patient complies with follow-up.  ? ? ?Past Surgical History:  ?Procedure Laterality Date  ? CARDIAC CATHETERIZATION N/A 04/06/2015  ? Procedure: Left Heart Cath and Cors/Grafts Angiography;  Surgeon: Lorretta Harp, MD;  Location: [place] CV LAB;  Service: Cardiovascular;  Laterality: N/A;  ? CARDIAC CATHETERIZATION N/A 04/06/2015  ? Procedure: Coronary Stent Intervention;  Surgeon: Lorretta Harp, MD;  2.5 mm x 12 mm long Synergy DES followed by  2.5 mm x 16 mm long Synergy DES    ? CORONARY ARTERY BYPASS GRAFT  2009  ? 2009 LIMA to LAD, SVG to Diag, SVG to PDA and PL, left radial to OM  ? CORONARY BALLOON ANGIOPLASTY N/A 05/04/2018  ? Procedure: CORONARY BALLOON ANGIOPLASTY;  Surgeon: Lorretta Harp, MD;  Location: [place] CV LAB;  Service: Cardiovascular;  Laterality: N/A;  Prox CFX  ? LEFT HEART CATH N/A 10/24/2012  ? Procedure: LEFT HEART CATH;  Surgeon: Troy Sine, MD;  Location: [facility] CATH LAB;  Service: Cardiovascular;  Laterality: N/A;  ? LEFT HEART CATH AND CORS/GRAFTS ANGIOGRAPHY N/A 05/04/2018  ? Procedure: LEFT HEART CATH AND CORS/GRAFTS ANGIOGRAPHY;  Surgeon: Lorretta Harp, MD;  Location: [place] CV LAB;  Service: Cardiovascular;  Laterality: N/A;  ? LEFT HEART CATH AND  CORS/GRAFTS ANGIOGRAPHY N/A 11/01/2020  ? Procedure: LEFT HEART CATH AND CORS/GRAFTS ANGIOGRAPHY;  Surgeon: Sherren Mocha, MD;  Location: [place] CV LAB;  Service: Cardiovascular;  Laterality: N/A;  ? ? ?Family History  ?Problem Relation Age of Onset  ? Heart failure Mother   ? Cancer Mother   ? CAD Mother 60  ? Heart failure Father   ? Kidney failure Brother   ? ? ?Social History:  reports that he has never smoked. He has  never used smokeless tobacco. He reports that he does not drink alcohol and does not use drugs. ? ?Allergies: No Known Allergies ? ?Medications: I have reviewed the patient's current medications. ?Scheduled: ? atorvastatin  80 mg Per Tube QPM  ? chlorhexidine gluconate (MEDLINE KIT)  15 mL Mouth Rinse BID  ? Chlorhexidine Gluconate Cloth  6 each Topical Daily  ? clopidogrel  75 mg Per Tube Daily  ? docusate  100 mg Per Tube BID  ? feeding supplement (PROSource TF)  90 mL Per Tube BID  ? fentaNYL (SUBLIMAZE) injection  50 mcg Intravenous Once  ? ferrous sulfate  300 mg Per Tube Daily  ? hydrALAZINE  10 mg Per Tube Q8H  ? insulin aspart  0-9 Units Subcutaneous Q4H  ? isosorbide dinitrate  20 mg Per Tube TID  ? mouth rinse  15 mL Mouth Rinse 10 times per day  ? multivitamin  1 tablet Per Tube QHS  ? pantoprazole sodium  40 mg Per Tube Daily  ? polyethylene glycol  17 g Per Tube Daily  ? senna  1 tablet Per Tube QHS  ? sodium chloride flush  3 mL Intravenous Q12H  ? sodium chloride flush  3 mL Intravenous Q12H  ? ? ? ?  Latest Ref Rng & Units 06/01/2021  ?  3:08 PM 06/01/2021  ?  9:26 AM 06/01/2021  ?  2:45 AM  ?BMP  ?Glucose 70 - 99 mg/dL 152   206   251    ?BUN 8 - 23 mg/dL 56   55   53    ?Creatinine 0.61 - 1.24 mg/dL 4.06   3.87   3.71    ?Sodium 135 - 145 mmol/L 133   132   131    ?Potassium 3.5 - 5.1 mmol/L 3.8   3.8   4.2    ?Chloride 98 - 111 mmol/L 99   100   101    ?CO2 22 - 32 mmol/L $RemoveB'20   21   17    'BaHvBVbB$ ?Calcium 8.9 - 10.3 mg/dL 7.7   7.5   7.6    ? ? ?  Latest Ref Rng & Units 06/01/2021  ?  2:45 AM 06/01/2021  ? 12:55 AM 05/31/2021  ?  7:46 PM  ?CBC  ?WBC 4.0 - 10.5 K/uL 9.8    8.0    ?Hemoglobin 13.0 - 17.0 g/dL 9.1   9.2   8.9    ?Hematocrit 39.0 - 52.0 % 29.1   27.0   28.8    ?Platelets 150 - 400 K/uL 183    164    ? ? ? ?DG CHEST PORT 1 VIEW ? ?Result Date: 06/01/2021 ?CLINICAL DATA:  Check gastric catheter placement EXAM: PORTABLE CHEST 1 VIEW COMPARISON:  05/31/2018 FINDINGS: Cardiac shadow is enlarged but stable.  Postsurgical changes are noted. Endotracheal tube and left subclavian central line are again seen and stable. Gastric catheter extends into the stomach. The lungs are well aerated. Persistent bibasilar opacities are noted stable  from the prior exam. No bony abnormality is seen. IMPRESSION: No significant interval change from the prior study. Electronically Signed   By: Inez Catalina M.D.   On: 06/01/2021 01:17  ? ?Portable Chest x-ray ? ?Result Date: 06/01/2021 ?[name], MD     06/01/2021 12:08 AM Intubation Procedure Note JEFREY RABURN [id number] 1955/07/14 Date:06/01/21 Time:12:07 AM Provider Performing:Ravi Agarwala Procedure: Intubation ([id number]) Indication(s) Respiratory Failure Consent Risks of the procedure as well as the alternatives and risks of each were explained to the patient and/or caregiver.  Consent for the procedure was obtained and is signed in the bedside chart Anesthesia Etomidate, Versed, Fentanyl, and Rocuronium Time Out Verified patient identification, verified procedure, site/side was marked, verified correct patient position, special equipment/implants available, medications/allergies/relevant history reviewed, required imaging and test results available. Sterile Technique Usual hand hygeine, masks, and gloves were used Procedure Description Patient positioned in bed supine.  Sedation given as noted above.  Patient was intubated with endotracheal tube using Glidescope.  View was Grade 1 full glottis .  Number of attempts was 1.  Colorimetric CO2 detector was consistent with tracheal placement. 8.0 ETT at 50TU Complications/Tolerance None; patient tolerated the procedure well. Chest X-ray is ordered to verify placement. [name], MD [facility] ICU Physician [place] Pager: [phone] Or Epic Secure Chat After hours: [phone]. 06/01/2021, 12:08 AM  ? ?ECHOCARDIOGRAM LIMITED ? ?Result Date: 06/01/2021 ?   ECHOCARDIOGRAM LIMITED REPORT   Patient Name:   Darrell [name]  Date of Exam: 06/01/2021 Medical Rec #:  [id number]         Height:       73.0 in Accession #:    [id number]        Weight:       214.7 lb Date of Birth:  07/30/1955         BSA:          2.218 m? Patient Age:

## 2021-06-01 NOTE — Procedures (Signed)
Intubation Procedure Note ? ?Darrell [name]  ?[id number]  ?July 31, 1955 ? ?Date:06/01/21  ?Time:12:07 AM  ? ?Provider Performing:Francena Zender  ? ? ?Procedure: Intubation ([id number]) ? ?Indication(s) ?Respiratory Failure ? ?Consent ?Risks of the procedure as well as the alternatives and risks of each were explained to the patient and/or caregiver.  Consent for the procedure was obtained and is signed in the bedside chart ? ? ?Anesthesia ?Etomidate, Versed, Fentanyl, and Rocuronium ? ? ?Time Out ?Verified patient identification, verified procedure, site/side was marked, verified correct patient position, special equipment/implants available, medications/allergies/relevant history reviewed, required imaging and test results available. ? ? ?Sterile Technique ?Usual hand hygeine, masks, and gloves were used ? ? ?Procedure Description ?Patient positioned in bed supine.  Sedation given as noted above.  Patient was intubated with endotracheal tube using Glidescope.  View was Grade 1 full glottis .  Number of attempts was 1.  Colorimetric CO2 detector was consistent with tracheal placement. ?8.0 ETT at 24cm ? ?Complications/Tolerance ?None; patient tolerated the procedure well. ?Chest X-ray is ordered to verify placement. ? ? ?Darrell Brood, MD [facility] ?ICU Physician ?[place]  ?Pager: [phone] ?Or Epic Secure Chat ?After hours: [phone]. ? ?06/01/2021, 12:08 AM ? ? ? ? ? ?

## 2021-06-01 NOTE — Progress Notes (Signed)
? ?NAME:  Darrell [name], MRN:  [id number], DOB:  13-May-1955, LOS: 3 ?ADMISSION DATE:  05/06/2021, CONSULTATION DATE: 05/30/2021 ?REFERRING MD: Rapid response team, CHIEF COMPLAINT: S/p torsade ? ?History of Present Illness:  ?66 year old male with coronary artery disease s/p CABG, prior history of VT arrest who initially presented with chest pain, noted to have acute non-ST elevation MI, medically managed as patient refused cardiac catheterization. ?On 5/3 in early morning hours patient was noted to go into ventricular tachycardia and then torsade, he became confused, rapid response was called, he was given magnesium, IV amiodarone, he was intubated and was transferred to ICU.  PCCM was consulted for help evaluation and management ? ?Pertinent  Medical History  ? ?Past Medical History:  ?Diagnosis Date  ? CKD (chronic kidney disease), stage III ([place])   ? Claudication [facility]   ? Coronary artery disease   ? a. 08/2001 s/p DES to RCA/RI;  b. 07/2006 s/p DES to p/d LCX;  c. 11/2007 CABGx5 (LIMA->LAD, VG->D1, LRA->OM1, VG->PDA->LPL); c. 03/2015: Synergy DES to mid-Cx in the setting of NSTEMI; d. 11/2015 NSTEMI->Med Rx. e. NSTEMI 01/2018 -> med rx.  ? Diabetes mellitus type 2 in nonobese [facility])   ? a. A1c 7.0 0000000.  ? Diastolic dysfunction   ? a. 02/2016 Echo: EF 50-55%, gr1DD, Ao sclerosis, dil Ao root (68mm), sev dil LA, triv TR, PASP 55mmHg.  ? Hyperlipidemia   ? Hypertension   ? Noncompliance   ? Right carotid bruit   ? a. noted 01/2018, will need OP eval when patient complies with follow-up.  ? ? ? ?Significant Hospital Events: ?Including procedures, antibiotic start and stop dates in addition to other pertinent events   ?5/5 recurrent VT arrest on floor ? ?Interim History / Subjective:  ?Events last night noted ?Awake on vent ?Poor UoP ? ?Objective   ?Blood pressure (!) 131/47, pulse 68, temperature 97.7 ?F (36.5 ?C), resp. rate (!) 22, height 6\' 1"  (1.854 m), weight 97.4 kg, SpO2 93 %. ?CVP:  [9 mmHg-17 mmHg] 17 mmHg   ?Vent Mode: PRVC ?FiO2 (%):  [40 %-100 %] 40 % ?Set Rate:  [16 bmp-20 bmp] 16 bmp ?Vt Set:  [id number] mL] 640 mL ?PEEP:  [8 cmH20] 8 cmH20 ?Plateau Pressure:  [19 cmH20-20 cmH20] 20 cmH20  ? ?Intake/Output Summary (Last 24 hours) at 06/01/2021 0906 ?Last data filed at 06/01/2021 0800 ?Gross per 24 hour  ?Intake 1749.35 ml  ?Output 323 ml  ?Net 1426.35 ml  ? ? ?Filed Weights  ? 05/17/2021 [id number] 06/01/21 0213  ?Weight: 101.6 kg 97.4 kg  ? ? ?Examination: ?No distress ?Moves all 4 ext to command ?Lungs with crackles ?Ext lukewarm ?RN cannot find DP on right but does have PT.  Left Okay.  Maybe mild mottling of dorsum of R foot ? ? ?Resolved Hospital Problem list   ?Hypertensive emergency ? 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 ?Had intermittent runs of VT overnight despite amio and lidocaine.  Mag/K are good.  This is presumed ischemic.  Cath may help but would probably result in patient needing HD although he probably will end up on HD anyway.  Will discuss with Dr. Ellyn Hack and family.  Continue vent support, heparin, nitro, anti-arrythmics in interim. ? ?  Recheck echo and coox. ?See how he does without dopamine. ? ?Best Practice (right click and "Reselect all SmartList Selections" daily)  ? ?Diet/type: NPO ?DVT prophylaxis: heparin drip ?GI prophylaxis: N/A ?Lines: Central line, discontinue ?Foley:  N/A ?Code Status:  full code ?Last date of multidisciplinary goals of care discussion [updated wife at length today on phone] ? ?34 min cc time ?Erskine Emery MD PCCM ? ? ?

## 2021-06-01 NOTE — Progress Notes (Signed)
eLink Physician-Brief Progress Note ?Patient Name: Darrell [name] ?DOB: 12/22/1955 ?MRN: [id number] ? ? ?Date of Service ? 06/01/2021  ?HPI/Events of Note ? Ventricular Tachycardia - Cardioverted into NSR with rate = 68 by nursing staff. BP = 185/55. Sat = 100%.  ?eICU Interventions ? Plan: ?BMP and Mg++ level STAT. ?Further management per Cardiology.   ? ? ? ?Intervention Category ?Major Interventions: Arrhythmia - evaluation and management ? ?Tamre Cass,Herold Dennard Nip ?06/01/2021, 10:41 PM ?

## 2021-06-01 NOTE — Progress Notes (Signed)
  Echocardiogram 2D Echocardiogram has been performed.  Leta Jungling M 06/01/2021, 10:13 AM

## 2021-06-01 NOTE — Progress Notes (Signed)
ANTICOAGULATION CONSULT NOTE - Follow Up Consult ? ?Pharmacy Consult for heparin infusion ?Indication: chest pain/ACS ? ?No Known Allergies ? ?Patient Measurements: ?Height: 6\' 1"  (185.4 cm) ?Weight: 97.4 kg (214 lb 11.7 oz) ?IBW/kg (Calculated) : 79.9 ? ?Vital Signs: ?Temp: 97.9 ?F (36.6 ?C) (05/05 1200) ?Temp Source: Bladder (05/05 0800) ?BP: 180/57 (05/05 1703) ?Pulse Rate: 66 (05/05 1800) ? ?Labs: ?Recent Labs  ?  05/30/21 ?0530 05/30/21 ?[id number] 05/31/21 ?1946 05/31/21 ?2200 06/01/21 ?[id number] 06/01/21 ?0245 06/01/21 ?[id number] 06/01/21 ?1508 06/01/21 ?1700  ?HGB 9.5*   < > 8.9*  --  9.2* 9.1*  --   --   --   ?HCT 29.7*   < > 28.8*  --  27.0* 29.1*  --   --   --   ?PLT 169  --  164  --   --  183  --   --   --   ?HEPARINUNFRC  --   --   --   --   --  0.30 0.25*  --  0.34  ?CREATININE 2.13*   < > 3.57*  --   --  3.71* 3.87* 4.06*  --   ?TROPONINIHS 5,164*  --  3,599* 3,738*  --   --   --   --   --   ? < > = values in this interval not displayed.  ? ? ? ?Estimated Creatinine Clearance: 22.6 mL/min (A) (by C-G formula based on SCr of 4.06 mg/dL (H)). ? ?Assessment: ?66 yo male admitted with NSTEMI, now s/p cardiac arrest on 05/31/21 due to VT/TdP. Pharmacy has been consulted to manage heparin infusion. ? ?Heparin level 0.34, therapeutic ?Current heparin infusion rate: 1500 units/hr ?Per RN, patient has some oozing from IV site where heparin gtt is infusing from.  ? ?Goal of Therapy:  ?Heparin level 0.3-0.7 units/ml ?Monitor platelets by anticoagulation protocol: Yes ?  ?Plan:  ?Continue heparin infusion at 1500 units/hr ?Daily CBC, heparin level ?Monitor for s/sx of bleeding ? ?Thank you for including pharmacy in the care of this patient. ? ?Luisa Hart, PharmD, BCPS ?Clinical Pharmacist ?06/01/2021 6:25 PM  ? ?Please refer to [employer] for pharmacy phone number  ? ?

## 2021-06-01 NOTE — Progress Notes (Signed)
Ground team at bedside to assess pt. Due to continued ectopy/runs of Vtach, incr WOB, hypotension requiring pressors, will electively intubate. Plan explained pt, agrees.  ? ?Called wife, [name], to update.  ?

## 2021-06-01 NOTE — Procedures (Signed)
Arterial Catheter Insertion Procedure Note ? ?Darrell [name]  ?[id number]  ?[birth year] ? ?Date:06/01/21  ?Time:1:41 AM  ? ? ?Provider Performing: Corey Harold  ? ? ?Procedure: Insertion of Arterial Line [phone]) with US guidance [id number])  ? ?Indication(s) ?Blood pressure monitoring and/or need for frequent ABGs ? ?Consent ?Unable to obtain consent due to emergent nature of procedure.  ? ?Anesthesia ?None ? ? ?Time Out ?Verified patient identification, verified procedure, site/side was marked, verified correct patient position, special equipment/implants available, medications/allergies/relevant history reviewed, required imaging and test results available. ? ? ?Sterile Technique ?Maximal sterile technique including full sterile barrier drape, hand hygiene, sterile gown, sterile gloves, mask, hair covering, sterile ultrasound probe cover (if used). ? ? ?Procedure Description ?Area of catheter insertion was cleaned with chlorhexidine and draped in sterile fashion. With real-time ultrasound guidance an arterial catheter was placed into the right radial artery.  Appropriate arterial tracings confirmed on monitor.   ? ? ?Complications/Tolerance ?None; patient tolerated the procedure well. ? ? ?EBL ?Minimal ? ? ?Specimen(s) ?None ? ? ?Georgann Housekeeper, AGACNP-BC ?[facility] ? ?See Amion for personal pager ?PCCM on call pager [phone] until 7pm. ?Please call Elink 7p-7a. [phone] ? ?06/01/2021 1:42 AM ? ? ? ? ? ?

## 2021-06-01 NOTE — Assessment & Plan Note (Signed)
Millimeters EF but significant diastolic dysfunction.  Likely exacerbated by ablation VT episodes.  Also worsening renal function with oliguric renal failure.. ? ?Poor urine output despite being on Lasix drip after high-dose boluses.  Minimal responsiveness to milrinone. ? ?We are adding low-dose hydralazine and Isordil for afterload reduction along with milrinone and continue Lasix drip.  Still has not had significant urine output. ? ?Nephrology consulted to consider the possibility of CRRT however, he would not necessarily be a great long-term HD candidate.  There is concern that once HD is started, he may not come off.  Appreciate nephrology consult ?

## 2021-06-02 ENCOUNTER — Encounter (HOSPITAL_COMMUNITY): Payer: Self-pay | Admitting: Family Medicine

## 2021-06-02 ENCOUNTER — Other Ambulatory Visit: Payer: Self-pay

## 2021-06-02 DIAGNOSIS — Z515 Encounter for palliative care: Secondary | ICD-10-CM | POA: Diagnosis not present

## 2021-06-02 DIAGNOSIS — Z7189 Other specified counseling: Secondary | ICD-10-CM | POA: Diagnosis not present

## 2021-06-02 DIAGNOSIS — I214 Non-ST elevation (NSTEMI) myocardial infarction: Secondary | ICD-10-CM | POA: Diagnosis not present

## 2021-06-02 LAB — MAGNESIUM
Magnesium: 2.8 mg/dL — ABNORMAL HIGH (ref 1.7–2.4)
Magnesium: 3.2 mg/dL — ABNORMAL HIGH (ref 1.7–2.4)

## 2021-06-02 LAB — CULTURE, BLOOD (ROUTINE X 2)
Culture: NO GROWTH
Culture: NO GROWTH
Special Requests: ADEQUATE
Special Requests: ADEQUATE

## 2021-06-02 LAB — CBC
HCT: 26.5 % — ABNORMAL LOW (ref 39.0–52.0)
Hemoglobin: 8.2 g/dL — ABNORMAL LOW (ref 13.0–17.0)
MCH: 25.4 pg — ABNORMAL LOW (ref 26.0–34.0)
MCHC: 30.9 g/dL (ref 30.0–36.0)
MCV: 82 fL (ref 80.0–100.0)
Platelets: 210 10*3/uL (ref 150–400)
RBC: 3.23 MIL/uL — ABNORMAL LOW (ref 4.22–5.81)
RDW: 19.2 % — ABNORMAL HIGH (ref 11.5–15.5)
WBC: 10 10*3/uL (ref 4.0–10.5)
nRBC: 0.5 % — ABNORMAL HIGH (ref 0.0–0.2)

## 2021-06-02 LAB — BASIC METABOLIC PANEL
Anion gap: 12 (ref 5–15)
Anion gap: 13 (ref 5–15)
BUN: 57 mg/dL — ABNORMAL HIGH (ref 8–23)
BUN: 56 mg/dL — ABNORMAL HIGH (ref 8–23)
CO2: 19 mmol/L — ABNORMAL LOW (ref 22–32)
CO2: 18 mmol/L — ABNORMAL LOW (ref 22–32)
Calcium: 7.6 mg/dL — ABNORMAL LOW (ref 8.9–10.3)
Calcium: 7.8 mg/dL — ABNORMAL LOW (ref 8.9–10.3)
Chloride: 101 mmol/L (ref 98–111)
Chloride: 100 mmol/L (ref 98–111)
Creatinine, Ser: 4.03 mg/dL — ABNORMAL HIGH (ref 0.61–1.24)
Creatinine, Ser: 4.23 mg/dL — ABNORMAL HIGH (ref 0.61–1.24)
GFR, Estimated: 16 mL/min — ABNORMAL LOW (ref >60)
GFR, Estimated: 15 mL/min — ABNORMAL LOW (ref >60)
Glucose, Bld: 151 mg/dL — ABNORMAL HIGH (ref 70–99)
Glucose, Bld: 213 mg/dL — ABNORMAL HIGH (ref 70–99)
Potassium: 3.4 mmol/L — ABNORMAL LOW (ref 3.5–5.1)
Potassium: 3.6 mmol/L (ref 3.5–5.1)
Sodium: 132 mmol/L — ABNORMAL LOW (ref 135–145)
Sodium: 131 mmol/L — ABNORMAL LOW (ref 135–145)

## 2021-06-02 LAB — GLUCOSE, CAPILLARY
Glucose-Capillary: 228 mg/dL — ABNORMAL HIGH (ref 70–99)
Glucose-Capillary: 154 mg/dL — ABNORMAL HIGH (ref 70–99)
Glucose-Capillary: 164 mg/dL — ABNORMAL HIGH (ref 70–99)
Glucose-Capillary: 167 mg/dL — ABNORMAL HIGH (ref 70–99)

## 2021-06-02 LAB — HEPARIN LEVEL (UNFRACTIONATED): Heparin Unfractionated: 0.32 IU/mL (ref 0.30–0.70)

## 2021-06-02 LAB — COOXEMETRY PANEL
Carboxyhemoglobin: 1.4 % (ref 0.5–1.5)
Methemoglobin: 0.7 % (ref 0.0–1.5)
O2 Saturation: 82.2 %
Total hemoglobin: 8.4 g/dL — ABNORMAL LOW (ref 12.0–16.0)

## 2021-06-02 LAB — LIDOCAINE LEVEL: Lidocaine Lvl: 4.9 ug/mL (ref 1.5–5.0)

## 2021-06-02 LAB — TRIGLYCERIDES: Triglycerides: 64 mg/dL (ref <150)

## 2021-06-02 LAB — PHOSPHORUS: Phosphorus: 4.7 mg/dL — ABNORMAL HIGH (ref 2.5–4.6)

## 2021-06-02 MED ORDER — MIDAZOLAM BOLUS VIA INFUSION
1.0000 mg | INTRAVENOUS | Status: DC | PRN
  Administered 2021-06-02 (×5): 1 mg via INTRAVENOUS
  Filled 2021-06-02: qty 1

## 2021-06-02 MED ORDER — MIDAZOLAM HCL 2 MG/2ML IJ SOLN
INTRAMUSCULAR | Status: AC
  Filled 2021-06-02: qty 2

## 2021-06-02 MED ORDER — POTASSIUM CHLORIDE 20 MEQ PO PACK
20.0000 meq | PACK | Freq: Once | ORAL | Status: AC
  Administered 2021-06-02: 20 meq
  Filled 2021-06-02: qty 1

## 2021-06-02 MED ORDER — POTASSIUM CHLORIDE 10 MEQ/50ML IV SOLN
10.0000 meq | INTRAVENOUS | Status: DC
  Filled 2021-06-02 (×2): qty 50

## 2021-06-02 MED ORDER — BIOTENE DRY MOUTH MT LIQD: 15.0000 mL | OROMUCOSAL | Status: DC | PRN

## 2021-06-02 MED ORDER — KETAMINE HCL 50 MG/5ML IJ SOSY
PREFILLED_SYRINGE | INTRAMUSCULAR | Status: AC
  Filled 2021-06-02: qty 5

## 2021-06-02 MED ORDER — ROCURONIUM BROMIDE 10 MG/ML (PF) SYRINGE
PREFILLED_SYRINGE | INTRAVENOUS | Status: AC
  Filled 2021-06-02: qty 10

## 2021-06-02 MED ORDER — FENTANYL CITRATE PF 50 MCG/ML IJ SOSY
PREFILLED_SYRINGE | INTRAMUSCULAR | Status: AC
  Filled 2021-06-02: qty 2

## 2021-06-02 MED ORDER — MIDAZOLAM-SODIUM CHLORIDE 100-0.9 MG/100ML-% IV SOLN
0.5000 mg/h | INTRAVENOUS | Status: DC
  Administered 2021-06-02: 4 mg/h via INTRAVENOUS
  Filled 2021-06-02: qty 100

## 2021-06-02 MED ORDER — POLYVINYL ALCOHOL 1.4 % OP SOLN
1.0000 [drp] | Freq: Four times a day (QID) | OPHTHALMIC | Status: DC | PRN
  Filled 2021-06-02: qty 15

## 2021-06-02 MED ORDER — FENTANYL BOLUS VIA INFUSION
50.0000 ug | INTRAVENOUS | Status: DC | PRN
  Administered 2021-06-02 (×2): 50 ug via INTRAVENOUS
  Filled 2021-06-02: qty 50

## 2021-06-02 MED ORDER — SUCCINYLCHOLINE CHLORIDE 200 MG/10ML IV SOSY
PREFILLED_SYRINGE | INTRAVENOUS | Status: AC
  Filled 2021-06-02: qty 10

## 2021-06-02 MED ORDER — ETOMIDATE 2 MG/ML IV SOLN
INTRAVENOUS | Status: AC
  Filled 2021-06-02: qty 20

## 2021-06-02 MED ORDER — PHENYLEPHRINE 80 MCG/ML (10ML) SYRINGE FOR IV PUSH (FOR BLOOD PRESSURE SUPPORT)
PREFILLED_SYRINGE | INTRAVENOUS | Status: AC
  Filled 2021-06-02: qty 10

## 2021-06-04 LAB — LIDOCAINE LEVEL: Lidocaine Lvl: 5.3 ug/mL — ABNORMAL HIGH (ref 1.5–5.0)

## 2021-06-06 MED FILL — Medication: Qty: 1 | Status: AC

## 2021-06-28 NOTE — Progress Notes (Signed)
Patient was compassionately extubated to room air with RN at the bedside at 17:28. ?

## 2021-06-28 NOTE — Progress Notes (Addendum)
eLink Physician-Brief Progress Note ?Patient Name: Darrell [name] ?DOB: July 11, 1955 ?MRN: [id number] ? ? ?Date of Service ? 06/18/2021  ?HPI/Events of Note ? Hypokalemia - K+ = 3.4 and Creatinine - 4.03. Patient is on a Lasix IV infusion. Cardiology has given Klor-con 20 meq per tube x 1.   ?eICU Interventions ? Plan: ?Send AM labs at 4 AM. Redose with KCl as indicated by lab results.   ? ? ? ?Intervention Category ?Major Interventions: Electrolyte abnormality - evaluation and management ? ?Henryetta Corriveau,Tobie Dennard Nip ?18-Jun-2021, 12:58 AM ?

## 2021-06-28 NOTE — Progress Notes (Signed)
? ?Palliative Medicine Inpatient Follow Up Note ? ?HPI: ?66 year old male with coronary artery disease s/p CABG, prior history of VT arrest who initially presented with chest pain, noted to have acute non-ST elevation MI, medically managed as patient refused cardiac catheterization. ?On 5/3 in early morning hours patient was noted to go into ventricular tachycardia and then torsade, he became confused, rapid response was called, he was given magnesium, IV amiodarone, he was intubated and was transferred to ICU.  Goals of care conversations have been had with the CCM team and patient has been clear about wanting all interventions to prolong life. ?  ?Palliative care has been requested to further discuss patients chronic disease processes. ? ?Today's Discussion 2021-06-28 ? ?*Please note that this is a verbal dictation therefore any spelling or grammatical errors are due to the "[employer]" system interpretation. ? ?Chart reviewed inclusive of vital signs, progress notes, laboratory results, and diagnostic images.  ? ?I mat at bedside with Jquan's RN,  Lauren. She shares patient had multiple episodes overnight whereby shocks needed tp be administered. ? ?Through review of CCM notes patient is not a candidate for cardiac catheterization or dialysis. As of presently he is not a candidate for an AICD either. His situation has gotten more complicated. Unfortunately, there are no great options for Eva at this time. ? ?Have called patients wife, son, and daughter to further address goals of care. We have reviewed that Kenric is critically ill and experiencing multisystem organ dysfunction. I shared the ongoing worry and concern that he will likely continue to deteriorate and progress.  ? ?We reviewed the option of continuing present measures which is in some ways prolonging the inevitable and possibly causing an increase in suffer burden.  ? ?We discussed inversely stopping the aggressive interventions set in  place and pursuing a comfort emphasis of care. ? ?Patient family are all in agreement with a DNAR though they would like to see him prior to further consideration of comfort care. Patients son, Gerald Stabs shares that he will be in this afternoon. ? ?Created space and opportunity for patient to explore thoughts feelings and fears regarding current medical situation. ? ?________________________________________________________________________ ?Addendum: ? ?I met with Helene Kelp and Harrell Gave, and Tiffany at bedside this afternoon.  We reviewed Gerilyn Nestle clinical condition and his declining health state. ? ?I shared that we are at a point where we continue to offer medical interventions though Sayre's body does not seem to be responding well to the interventions offered.  We discussed that he has required defibrillation multiple times which is extremely painful, and presently delaying the inevitable. ? ?I shared with patient's family that presently [place] body is making the decision for [place], showing [place] that cannot sustain long-term like this. ? ?We talked about transition to comfort measures in house and what that would entail inclusive of medications to control pain, dyspnea, agitation, nausea, itching, and hiccups.  We discussed stopping all uneccessary measures such as mechanical ventilation, nasogastric tube feeding, cardiac monitoring, blood draws, needle sticks, and frequent vital signs.  ? ?We reviewed what postmortem care looks like. Velmer had not vocalized wishes to his family in terms of wanting cremation or casket burial. ? ?Palliative Support Provided ? ?Patient's family would like to spend some time with Amar prior to proceeding with more of a comfort oriented approach. ?_____________________________________________ ?Addendum 2: ? ?Per coordination with patient RN, Ander Purpura patients family have vocalized readiness to allow Faaris to be compassionately extubated.  ? ?Ordered have been entered. ? ?  CCM team, Dr.  Tamala Julian updated. ? ?Objective Assessment: ?Vital Signs ?Vitals:  ? 06/12/2021 1215 Jun 12, 2021 1230  ?BP:    ?Pulse: 61 60  ?Resp: 16 16  ?Temp:    ?SpO2: 99% 98%  ? ? ?Intake/Output Summary (Last 24 hours) at 2021-06-12 1235 ?Last data filed at 06/12/21 1200 ?Gross per 24 hour  ?Intake 3927.51 ml  ?Output 955 ml  ?Net 2972.51 ml  ? ?Last Weight  Most recent update: 06-12-2021  2:32 AM  ? ? Weight  ?101 kg (222 lb 10.6 oz)  ?      ? ?  ? ?Gen: Frail elderly Caucasian male ?HEENT: ETT, OG tube, dry mucous membranes ?CV: Regular rate and rhythm ?PULM: On mechanical ventilator ?ABD: soft/nontender ?EXT: Pedal edema present ?Neuro: Alert can nod and shake head ? ?SUMMARY OF RECOMMENDATIONS   ?DNAR ? ?Compassionate extubation this afternoon ? ?Comfort Care ? ? Start low dose fentanyl and versed gtts ? ?Unrestricted visitation ? ?Ongoing palliative support  ? ?Time Spent: 71 ? ?Billing based on MDM: High ? ?Problems Addressed: One acute or chronic illness or injury that poses a threat to life or bodily function ? ?Amount and/or Complexity of Data: Category 3:Discussion of management or test interpretation with external physician/other qualified health care professional/appropriate source (not separately reported) ? ?Risks: Decision not to resuscitate or to de-escalate care because of poor prognosis ?______________________________________________________________________________________ ?Tacey Ruiz ?Yell Team ?Team Cell Phone: [phone] ?Please utilize secure chat with additional questions, if there is no response within 30 minutes please call the above phone number ? ?Palliative Medicine Team providers are available by phone from 7am to 7pm daily and can be reached through the team cell phone.  ?Should this patient require assistance outside of these hours, please call the patient's attending physician. ? ? ? ? ?

## 2021-06-28 NOTE — Progress Notes (Signed)
?   06/09/2021 1525  ?Clinical Encounter Type  ?Visited With Patient;Family  ?Visit Type Initial;Spiritual support  ?Referral From Nurse  ?Consult/Referral To Chaplain  ? ?Chaplain responded to request for prayer by family of patient.  ?I prayed for patient, Darrell [name], and for support for the family in their time of need.  ? ? ?Valerie Roys  ?Chaplain  ?[facility] ?[phone] ?

## 2021-06-28 NOTE — Progress Notes (Signed)
ANTICOAGULATION CONSULT NOTE - Follow Up Consult ? ?Pharmacy Consult for heparin infusion ?Indication: chest pain/ACS ? ?No Known Allergies ? ?Patient Measurements: ?Height: 6\' 1"  (185.4 cm) ?Weight: 101 kg (222 lb 10.6 oz) ?IBW/kg (Calculated) : 79.9 ? ?Vital Signs: ?Temp: 97.7 ?F (36.5 ?C) (05/05 1900) ?Temp Source: Axillary (05/05 1900) ?BP: 129/41 (05/06 0335) ?Pulse Rate: 60 (05/06 0415) ? ?Labs: ?Recent Labs  ?  05/30/21 ?0530 05/30/21 ?[id number] 05/31/21 ?1946 05/31/21 ?2200 06/01/21 ?[id number] 06/01/21 ?[id number] 06/01/21 ?0245 06/01/21 ?[id number] 06/01/21 ?1508 06/01/21 ?1700 06/01/21 ?2305 06-06-21 ?[id number]  ?HGB 9.5*   < > 8.9*  --  9.2*  --  9.1*  --   --   --   --  8.2*  ?HCT 29.7*   < > 28.8*  --  27.0*  --  29.1*  --   --   --   --  26.5*  ?PLT 169  --  164  --   --   --  183  --   --   --   --  210  ?HEPARINUNFRC  --   --   --   --   --    < > 0.30 0.25*  --  0.34  --  0.32  ?CREATININE 2.13*   < > 3.57*  --   --   --  3.71* 3.87* 4.06*  --  4.03* 4.23*  ?TROPONINIHS [id number]*  --  3,599* 3,738*  --   --   --   --   --   --   --   --   ? < > = values in this interval not displayed.  ? ? ? ?Estimated Creatinine Clearance: 22 mL/min (A) (by C-G formula based on SCr of 4.23 mg/dL (H)). ? ?Assessment: ?66 yo male admitted with NSTEMI, now s/p cardiac arrest on 05/31/21 due to VT/TdP. Pharmacy has been consulted to manage heparin infusion. ? ?5/6 AM update: ?Heparin level remains therapeutic  ? ?Goal of Therapy:  ?Heparin level 0.3-0.7 units/ml ?Monitor platelets by anticoagulation protocol: Yes ?  ?Plan:  ?Continue heparin infusion at 1500 units/hr ?Daily CBC/heparin level ?Trend Hgb  ? ?Narda Bonds, PharmD, BCPS ?Clinical Pharmacist ?Phone: [phone] ? ?

## 2021-06-28 NOTE — Progress Notes (Signed)
Patient ID: Darrell [name], male   DOB: 07-Feb-1955, 66 y.o.   MRN: [id number] ?[facility] ?Progress Note  ? ?Assessment/ Plan:   ?1.  Acute kidney injury on chronic kidney disease stage IIIb: Underlying chronic kidney disease likely from hypertension/cardiorenal syndrome.  His acute kidney injury appears to be ATN associated with his ventricular tachycardia arrest.  Some improvement of urine output noted overnight on furosemide drip/milrinone.  Based on his tenuous cardiac status and limited baseline functional state, would be best to avoid CRRT as his dialysis dependency is likely to be chronic and to negatively impact his quality of life/hasten mortality.  Agree with palliative care/comfort care measures. ?2.  Ventilator dependent respiratory failure: Secondary to ventricular tachycardia arrest and recurrent ventricular fibrillation arrest previously including episodes of ventricular tachycardia overnight. ?3.  Recurrent ventricular tachycardia: Previously declined placement of ICD and plans noted for possible repeat coronary angiography however, prognosis remains overall poor especially with worsening renal function and he is not candidacy for chronic dialysis.  Support palliative care track. ?4.  Acute exacerbation of chronic systolic heart failure: Some response noted to furosemide overnight along with milrinone, not a candidate for RRT. ? ?Subjective:   ?Shocked twice overnight for monomorphic ventricular tachycardia  ? ?Objective:   ?BP (!) 129/41   Pulse (!) 59   Temp 97.7 ?F (36.5 ?C) (Axillary)   Resp 16   Ht _0  (1.854 m)   Wt 101 kg   SpO2 99%   BMI 29.38 kg/m?  ? ?Intake/Output Summary (Last 24 hours) at Jun 20, 2021 0753 ?Last data filed at 2021/06/20 0700 ?Gross per 24 hour  ?Intake 3280.85 ml  ?Output 940 ml  ?Net 2340.85 ml  ? ?Weight change: 3.6 kg ? ?Physical Exam: ?Gen: Intubated, unresponsive ?CVS: Pulse regular rhythm, normal rate, S1 and S2 distant ?Resp: On ventilator,  breath sounds clear anteriorly ?Abd: Soft, flat, nontender, bowel sounds normal ?Ext: 1-2+ lower extremity edema bilaterally ? ?Imaging: ?DG CHEST PORT 1 VIEW ? ?Result Date: 06/01/2021 ?CLINICAL DATA:  Check gastric catheter placement EXAM: PORTABLE CHEST 1 VIEW COMPARISON:  05/31/2018 FINDINGS: Cardiac shadow is enlarged but stable. Postsurgical changes are noted. Endotracheal tube and left subclavian central line are again seen and stable. Gastric catheter extends into the stomach. The lungs are well aerated. Persistent bibasilar opacities are noted stable from the prior exam. No bony abnormality is seen. IMPRESSION: No significant interval change from the prior study. Electronically Signed   By: Inez Catalina M.D.   On: 06/01/2021 01:17  ? ?Portable Chest x-ray ? ?Result Date: 06/01/2021 ?[name], MD     06/01/2021 12:08 AM Intubation Procedure Note Darrell [name] [id number] Apr 07, 1955 Date:06/01/21 Time:12:07 AM Provider Performing:Ravi Agarwala Procedure: Intubation (50354) Indication(s) Respiratory Failure Consent Risks of the procedure as well as the alternatives and risks of each were explained to the patient and/or caregiver.  Consent for the procedure was obtained and is signed in the bedside chart Anesthesia Etomidate, Versed, Fentanyl, and Rocuronium Time Out Verified patient identification, verified procedure, site/side was marked, verified correct patient position, special equipment/implants available, medications/allergies/relevant history reviewed, required imaging and test results available. Sterile Technique Usual hand hygeine, masks, and gloves were used Procedure Description Patient positioned in bed supine.  Sedation given as noted above.  Patient was intubated with endotracheal tube using Glidescope.  View was Grade 1 full glottis .  Number of attempts was 1.  Colorimetric CO2 detector was consistent with tracheal placement. 8.0 ETT at 65KC Complications/Tolerance None; patient  tolerated the  procedure well. Chest X-ray is ordered to verify placement. [name], MD [facility] ICU Physician [place] Pager: [phone] Or Epic Secure Chat After hours: [phone]. 06/01/2021, 12:08 AM  ? ?ECHOCARDIOGRAM LIMITED ? ?Result Date: 06/01/2021 ?   ECHOCARDIOGRAM LIMITED REPORT   Patient Name:   Darrell [name] Date of Exam: 06/01/2021 Medical Rec #:  [id number]         Height:       73.0 in Accession #:    [id number]        Weight:       214.7 lb Date of Birth:  [birth year]         BSA:          2.218 m? Patient Age:    34 years          BP:           144/49 mmHg Patient Gender: M                 HR:           63 bpm. Exam Location:  Inpatient Procedure: Limited Echo, Cardiac Doppler and Color Doppler Indications:    Dilated cardiomyopathy I42.0  History:        Patient has prior history of Echocardiogram examinations, most                 recent 06/03/2021. CHF and Cardiomyopathy, Carotid Disease; Risk                 Factors:Hypertension, Diabetes and Dyslipidemia. Chronic kidney                 disease.  Sonographer:    Darlina Sicilian RDCS Sonographer#2:  Eartha Inch Referring Phys: [id number] Candee Furbish  Sonographer Comments: Echo performed with patient supine and on artificial respirator. IMPRESSIONS  1. There is normal pulmonary artery systolic pressure. Comparison(s): No significant change from prior study. Conclusion(s)/Recommendation(s): LVEF 45-50%. Mild to moderate mitral regurtitaion ERO ~ 29 mm2. Mild-moderate aortic regurgitation. FINDINGS  Right Ventricle: There is normal pulmonary artery systolic pressure. The tricuspid regurgitant velocity is 2.21 m/s, and with an assumed right atrial pressure of 8 mmHg, the estimated right ventricular systolic pressure is 99.2 mmHg. Aortic Valve: Aortic regurgitation PHT measures 333 msec. LEFT VENTRICLE PLAX 2D LVIDd:         4.80 cm LVIDs:         3.90 cm  AORTIC VALVE AI PHT:      333 msec MR Peak grad:    60.2 mmHg    TRICUSPID VALVE MR Mean  grad:    42.0 mmHg    TR Peak grad:   19.5 mmHg MR Vmax:         388.00 cm/s  TR Vmax:        221.00 cm/s MR Vmean:        305.0 cm/s MR PISA:         3.08 cm? MR PISA Eff ROA: 31 mm? MR PISA Radius:  0.70 cm Phineas Inches Electronically signed by Phineas Inches Signature Date/Time: 06/01/2021/10:26:09 AM    Final    ? ?Labs: ?BMET ?Recent Labs  ?Lab 05/30/21 ?0530 05/30/21 ?4268 05/31/21 ?3419 05/31/21 ?1946 06/01/21 ?6222 06/01/21 ?0245 06/01/21 ?9798 06/01/21 ?1508 06/01/21 ?2305 15-Jun-2021 ?9211  ?NA 133*   < > 132* 133* 131* 131* 132* 133* 132* 131*  ?K 4.8   < > 4.3 4.1 4.1 4.2 3.8 3.8 3.4*  3.6  ?CL 103   < > 103 102  --  101 100 99 101 100  ?CO2 16*   < > 17* 20*  --  17* 21* 20* 19* 18*  ?GLUCOSE 135*   < > 141* 209*  --  251* 206* 152* 151* 213*  ?BUN 33*   < > 42* 50*  --  53* 55* 56* 57* 56*  ?CREATININE 2.13*   < > 2.94* 3.57*  --  3.71* 3.87* 4.06* 4.03* 4.23*  ?CALCIUM 8.4*   < > 7.7* 7.6*  --  7.6* 7.5* 7.7* 7.6* 7.8*  ?PHOS 3.6  --   --  5.8*  --   --   --  5.3*  --  4.7*  ? < > = values in this interval not displayed.  ? ?CBC ?Recent Labs  ?Lab 05/02/2021 ?3976 06/20/2021 ?7341 05/30/21 ?0530 05/30/21 ?9379 05/31/21 ?1946 06/01/21 ?0240 06/01/21 ?0245 Jun 27, 2021 ?9735  ?WBC 10.0   < > 8.6  --  8.0  --  9.8 10.0  ?NEUTROABS 7.0  --   --   --   --   --   --   --   ?HGB 11.6*   < > 9.5*   < > 8.9* 9.2* 9.1* 8.2*  ?HCT 37.9*   < > 29.7*   < > 28.8* 27.0* 29.1* 26.5*  ?MCV 84.6   < > 82.7  --  85.0  --  83.1 82.0  ?PLT 221   < > 169  --  164  --  183 210  ? < > = values in this interval not displayed.  ? ? ?Medications:   ? ? atorvastatin  80 mg Per Tube QPM  ? chlorhexidine gluconate (MEDLINE KIT)  15 mL Mouth Rinse BID  ? Chlorhexidine Gluconate Cloth  6 each Topical Daily  ? clopidogrel  75 mg Per Tube Daily  ? docusate  100 mg Per Tube BID  ? feeding supplement (PROSource TF)  90 mL Per Tube BID  ? fentaNYL (SUBLIMAZE) injection  50 mcg Intravenous Once  ? ferrous sulfate  300 mg Per Tube Daily  ? hydrALAZINE  25  mg Per Tube Q8H  ? insulin aspart  0-9 Units Subcutaneous Q4H  ? isosorbide dinitrate  20 mg Per Tube TID  ? mouth rinse  15 mL Mouth Rinse 10 times per day  ? multivitamin  1 tablet Per Tube QHS  ? pantoprazole

## 2021-06-28 NOTE — Progress Notes (Signed)
Wasted 125 mL of fentanyl Drip and 75 ML Versed with Lerry Liner ?

## 2021-06-28 NOTE — Progress Notes (Signed)
? ?Progress Note ? ?Patient Name: Darrell [name] ?Date of Encounter: 06-07-2021 ? ?[facility] Cardiologist: Minus Breeding, MD  ? ?Subjective  ? ?Intubated and sedated ? ?Inpatient Medications  ?  ?Scheduled Meds: ? atorvastatin  80 mg Per Tube QPM  ? chlorhexidine gluconate (MEDLINE KIT)  15 mL Mouth Rinse BID  ? Chlorhexidine Gluconate Cloth  6 each Topical Daily  ? clopidogrel  75 mg Per Tube Daily  ? docusate  100 mg Per Tube BID  ? feeding supplement (PROSource TF)  90 mL Per Tube BID  ? fentaNYL (SUBLIMAZE) injection  50 mcg Intravenous Once  ? ferrous sulfate  300 mg Per Tube Daily  ? hydrALAZINE  25 mg Per Tube Q8H  ? insulin aspart  0-9 Units Subcutaneous Q4H  ? isosorbide dinitrate  20 mg Per Tube TID  ? mouth rinse  15 mL Mouth Rinse 10 times per day  ? multivitamin  1 tablet Per Tube QHS  ? pantoprazole sodium  40 mg Per Tube Daily  ? polyethylene glycol  17 g Per Tube Daily  ? senna  1 tablet Per Tube QHS  ? sodium chloride flush  3 mL Intravenous Q12H  ? sodium chloride flush  3 mL Intravenous Q12H  ? ?Continuous Infusions: ? sodium chloride Stopped (05/31/21 1917)  ? sodium chloride    ? sodium chloride Stopped (2021-06-07 0742)  ? amiodarone 60 mg/hr (2021-06-07 0800)  ? cefTRIAXone (ROCEPHIN)  IV Stopped (06/01/21 1109)  ? feeding supplement (VITAL 1.5 CAL) 40 mL/hr at 07-Jun-2021 0522  ? fentaNYL infusion INTRAVENOUS 125 mcg/hr (07-Jun-2021 0800)  ? furosemide (LASIX) 200 mg in dextrose 5% 100 mL ($Remov'2mg'NXCRFG$ /mL) infusion 30 mg/hr (06/07/21 0800)  ? heparin 1,500 Units/hr (2021-06-07 0800)  ? lidocaine 0.5 mg/min (Jun 07, 2021 0818)  ? magnesium sulfate    ? milrinone 0.375 mcg/kg/min (2021-06-07 0820)  ? norepinephrine (LEVOPHED) Adult infusion 4 mcg/min (Jun 07, 2021 3614)  ? propofol (DIPRIVAN) infusion 25 mcg/kg/min (06/07/2021 0800)  ? ?PRN Meds: ?sodium chloride, sodium chloride, acetaminophen, fentaNYL, hydrALAZINE, midazolam, midazolam, ondansetron (ZOFRAN) IV, sodium chloride flush, sodium chloride flush  ? ?Vital  Signs  ?  ?Vitals:  ? 06-07-2021 0600 06-07-2021 0615 2021/06/07 0700 Jun 07, 2021 0800  ?BP:      ?Pulse: (!) 59 (!) 59 (!) 58 (!) 56  ?Resp: $Remov'16 16 16 18  'ygxJNs$ ?Temp:      ?TempSrc:      ?SpO2: 99% 99% 99% 99%  ?Weight:      ?Height:      ? ? ?Intake/Output Summary (Last 24 hours) at Jun 07, 2021 0835 ?Last data filed at 2021-06-07 4315 ?Gross per 24 hour  ?Intake 3617.05 ml  ?Output 925 ml  ?Net 2692.05 ml  ? ? ?  06-07-21  ?  2:30 AM 06/01/2021  ?  2:13 AM 05/08/2021  ?  8:38 AM  ?Last 3 Weights  ?Weight (lbs) 222 lb 10.6 oz 214 lb 11.7 oz 224 lb  ?Weight (kg) 101 kg 97.4 kg 101.606 kg  ?   ? ?Telemetry  ?  ?Sinus rhythm- Personally Reviewed ? ?ECG  ?  ?Sinus rhythm, LVH- Personally Reviewed ? ?Physical Exam  ? ?GEN: No acute distress.   ?Neck: JVD to the angle of the jaw ?Cardiac: RRR, no murmurs, rubs, or gallops.  ?Respiratory: Clear to auscultation bilaterally. ?GI: Soft, nontender, non-distended  ?MS: No edema; No deformity. ?Neuro:  Nonfocal  ?Psych: Sedated ? ?Labs  ?  ?High Sensitivity Troponin:   ?Recent Labs  ?Lab 05/20/2021 ?1624  05/02/2021 ?1753 05/30/21 ?0530 05/31/21 ?1946 05/31/21 ?2200  ?TROPONINIHS 6,384* 5,364* 6,803* 3,599* 3,738*  ?   ?Chemistry ?Recent Labs  ?Lab 05/30/21 ?0530 05/30/21 ?2122 05/31/21 ?1946 06/01/21 ?4825 06/01/21 ?0037 06/01/21 ?1508 06/01/21 ?2305 06/04/2021 ?0488  ?NA 133*   < > 133*   < > 132* 133* 132* 131*  ?K 4.8   < > 4.1   < > 3.8 3.8 3.4* 3.6  ?CL 103   < > 102   < > 100 99 101 100  ?CO2 16*   < > 20*   < > 21* 20* 19* 18*  ?GLUCOSE 135*   < > 209*   < > 206* 152* 151* 213*  ?BUN 33*   < > 50*   < > 55* 56* 57* 56*  ?CREATININE 2.13*   < > 3.57*   < > 3.87* 4.06* 4.03* 4.23*  ?CALCIUM 8.4*   < > 7.6*   < > 7.5* 7.7* 7.6* 7.8*  ?MG 2.0   < > 2.3   < >  --  3.0* 2.8* 3.2*  ?PROT 6.0*  --  5.8*  --  5.6*  --   --   --   ?ALBUMIN 2.4*  --  2.3*  --  2.2*  --   --   --   ?AST 25  --  90*  --  84*  --   --   --   ?ALT 11  --  131*  --  154*  --   --   --   ?ALKPHOS 61  --  112  --  107  --   --   --    ?BILITOT 1.0  --  0.8  --  0.6  --   --   --   ?GFRNONAA 34*   < > 18*   < > 17* 16* 16* 15*  ?ANIONGAP 14   < > 11   < > $R'11 14 12 13  'fX$ ? < > = values in this interval not displayed.  ?  ?Lipids  ?Recent Labs  ?Lab 05/06/2021 ?8916 06-04-21 ?9450  ?CHOL 166  --   ?TRIG 70 64  ?HDL 47  --   ?LDLCALC 105*  --   ?CHOLHDL 3.5  --   ?  ?Hematology ?Recent Labs  ?Lab 05/31/21 ?1946 06/01/21 ?3888 06/01/21 ?0245 06/04/21 ?2800  ?WBC 8.0  --  9.8 10.0  ?RBC 3.39*  --  3.50* 3.23*  ?HGB 8.9* 9.2* 9.1* 8.2*  ?HCT 28.8* 27.0* 29.1* 26.5*  ?MCV 85.0  --  83.1 82.0  ?MCH 26.3  --  26.0 25.4*  ?MCHC 30.9  --  31.3 30.9  ?RDW 19.2*  --  19.3* 19.2*  ?PLT 164  --  183 210  ? ?Thyroid No results for input(s): TSH, FREET4 in the last 168 hours.  ?BNP ?Recent Labs  ?Lab 06/01/21 ?0245  ?BNP 3,483.3*  ?  ?DDimer No results for input(s): DDIMER in the last 168 hours.  ? ?Radiology  ?  ?DG CHEST PORT 1 VIEW ? ?Result Date: 06/01/2021 ?CLINICAL DATA:  Check gastric catheter placement EXAM: PORTABLE CHEST 1 VIEW COMPARISON:  05/31/2018 FINDINGS: Cardiac shadow is enlarged but stable. Postsurgical changes are noted. Endotracheal tube and left subclavian central line are again seen and stable. Gastric catheter extends into the stomach. The lungs are well aerated. Persistent bibasilar opacities are noted stable from the prior exam. No bony abnormality is seen. IMPRESSION: No significant interval change from the  prior study. Electronically Signed   By: Inez Catalina M.D.   On: 06/01/2021 01:17  ? ?Portable Chest x-ray ? ?Result Date: 06/01/2021 ?[name], MD     06/01/2021 12:08 AM Intubation Procedure Note EFSTATHIOS SAWIN [id number] Sep 29, 1955 Date:06/01/21 Time:12:07 AM Provider Performing:Ravi Agarwala Procedure: Intubation (45809) Indication(s) Respiratory Failure Consent Risks of the procedure as well as the alternatives and risks of each were explained to the patient and/or caregiver.  Consent for the procedure was obtained and is signed in  the bedside chart Anesthesia Etomidate, Versed, Fentanyl, and Rocuronium Time Out Verified patient identification, verified procedure, site/side was marked, verified correct patient position, special equipment/implants available, medications/allergies/relevant history reviewed, required imaging and test results available. Sterile Technique Usual hand hygeine, masks, and gloves were used Procedure Description Patient positioned in bed supine.  Sedation given as noted above.  Patient was intubated with endotracheal tube using Glidescope.  View was Grade 1 full glottis .  Number of attempts was 1.  Colorimetric CO2 detector was consistent with tracheal placement. 8.0 ETT at 98PJ Complications/Tolerance None; patient tolerated the procedure well. Chest X-ray is ordered to verify placement. [name], MD [facility] ICU Physician [place] Pager: [phone] Or Epic Secure Chat After hours: [phone]. 06/01/2021, 12:08 AM  ? ?ECHOCARDIOGRAM LIMITED ? ?Result Date: 06/01/2021 ?   ECHOCARDIOGRAM LIMITED REPORT   Patient Name:   ESTIVEN KOHAN Date of Exam: 06/01/2021 Medical Rec #:  [id number]         Height:       73.0 in Accession #:    [id number]        Weight:       214.7 lb Date of Birth:  Jan 31, 1955         BSA:          2.218 m? Patient Age:    66 years          BP:           144/49 mmHg Patient Gender: M                 HR:           63 bpm. Exam Location:  Inpatient Procedure: Limited Echo, Cardiac Doppler and Color Doppler Indications:    Dilated cardiomyopathy I42.0  History:        Patient has prior history of Echocardiogram examinations, most                 recent 06/12/2021. CHF and Cardiomyopathy, Carotid Disease; Risk                 Factors:Hypertension, Diabetes and Dyslipidemia. Chronic kidney                 disease.  Sonographer:    Darlina Sicilian RDCS Sonographer#2:  Eartha Inch Referring Phys: [id number] Candee Furbish  Sonographer Comments: Echo performed with patient supine and on  artificial respirator. IMPRESSIONS  1. There is normal pulmonary artery systolic pressure. Comparison(s): No significant change from prior study. Conclusion(s)/Recommendation(s): LVEF 45-50%. Mild to mo

## 2021-06-28 NOTE — Death Summary Note (Signed)
?DEATH SUMMARY  ? ?Patient Details  ?Name: Darrell [name] ?MRN: [id number] ?DOB: April 26, 1955 ? ?Admission/Discharge Information  ? ?Admit Date:  06-11-21  ?Date of Death: Date of Death: 06/17/21  ?Time of Death: Time of Death: 19  ?Length of Stay: 4  ?Referring Physician: [name], MD  ? 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 ? ?66 year old male with coronary artery disease s/p CABG, prior history of VT arrest who initially presented with chest pain, noted to have acute non-ST elevation MI, medically managed as patient refused cardiac catheterization. ?On 5/3 in early morning hours patient was noted to go into ventricular tachycardia and then torsade, he became confused, rapid response was called, he was given magnesium, IV amiodarone, he was intubated and was transferred to ICU.  PCCM was consulted for help evaluation and management ? ?Went into refractory intermittent VT as well as progressive renal failure.  After multiple multidisciplinary discussions, not felt to be candidate for dialysis nor was a cath this late into his NSTEMI likely to fix his recurrent VT.  After discussion with family, all agreed he should be allowed to pass in peace. ? ? ?Pertinent Labs and Studies  ?Significant Diagnostic Studies ?DG CHEST PORT 1 VIEW ? ?Result Date: 06/01/2021 ?CLINICAL DATA:  Check gastric catheter placement EXAM: PORTABLE CHEST 1 VIEW COMPARISON:  05/31/2018 FINDINGS: Cardiac shadow is enlarged but stable. Postsurgical changes are noted. Endotracheal tube and left subclavian central line are again seen and stable. Gastric catheter extends into the stomach. The lungs are well aerated.  Persistent bibasilar opacities are noted stable from the prior exam. No bony abnormality is seen. IMPRESSION: No significant interval change from the prior study. Electronically Signed   By: Inez Catalina M.D.   On: 06/01/2021 01:17  ? ?Portable Chest x-ray ? ?Result Date: 06/01/2021 ?[name], MD     06/01/2021 12:08 AM Intubation Procedure Note RODRIGUS NAFUS [id number] [birth year] Date:06/01/21 Time:12:07 AM Provider Performing:Ravi Agarwala Procedure: Intubation ([id number]) Indication(s) Respiratory Failure Consent Risks of the procedure as well as the alternatives and risks of each were explained to the patient and/or caregiver.  Consent for the procedure was obtained and is signed in the bedside chart Anesthesia Etomidate, Versed, Fentanyl, and Rocuronium Time Out Verified patient identification, verified procedure, site/side was marked, verified correct patient position, special equipment/implants available, medications/allergies/relevant history reviewed, required imaging and test results available. Sterile Technique Usual hand hygeine, masks, and gloves were used Procedure Description Patient positioned in bed supine.  Sedation given as noted above.  Patient was intubated with endotracheal tube using Glidescope.  View was Grade 1 full glottis .  Number of attempts was 1.  Colorimetric CO2 detector was consistent with tracheal placement. 8.0 ETT at [id number] Complications/Tolerance None; patient tolerated the procedure well. Chest X-ray is ordered to verify placement. [name], MD [facility] ICU Physician [place] Pager: [phone] Or Epic Secure Chat After hours: [phone]. 06/01/2021, 12:08 AM  ? ?DG Chest Port 1 View ? ?Result Date: 05/30/2021 ?CLINICAL DATA:  Central line placement EXAM: PORTABLE CHEST 1 VIEW COMPARISON:  Multiple chest XRs most recently 05/30/2021. CT chest, 12/17/2007 FINDINGS: Support lines: ETT catheter tip within the distal thoracic trachea, 4 cm from carina. Enteric  feeding tube  with tip and side port outside the field of view. New LEFT subclavian approach CVC with catheter tip within the SVC. Multiple overlying pacer leads. Cardiomediastinal silhouette is unchanged. Coronary bypass changes. Hypoinflation with bilateral perihilar opacities. No large pleural effusion or pneumothorax. Median sternotomy changes. No interval osseous abnormality. IMPRESSION: 1. New LEFT subclavian approach CVC with catheter tip within the SVC. No pneumothorax. Additional lines and tubes as above. 2. Unchanged pulmonary findings. Electronically Signed   By: Michaelle Birks M.D.   On: 05/30/2021 08:29  ? ?DG CHEST PORT 1 VIEW ? ?Result Date: 05/30/2021 ?CLINICAL DATA:  Intubation EXAM: PORTABLE CHEST 1 VIEW COMPARISON:  Yesterday FINDINGS: Endotracheal tube with tip between the clavicular heads and carina. The enteric tube reaches the stomach at least. Cardiomegaly with prior CABG. Hazy perihilar and lower chest. No visible effusion or pneumothorax. IMPRESSION: 1. Unremarkable hardware. 2. Symmetric hazy appearance of the chest favoring edema. Electronically Signed   By: Jorje Guild M.D.   On: 05/30/2021 06:50  ? ?DG CHEST PORT 1 VIEW ? ?Result Date: 05/30/2021 ?CLINICAL DATA:  Intubation EXAM: PORTABLE CHEST 1 VIEW COMPARISON:  06/23/2021 FINDINGS: Endotracheal tube with tip at the clavicular heads. The enteric tube reaches the stomach. Cardiomegaly; prior CABG. Hazy perihilar and lower chest. No covered effusion. No pneumothorax. IMPRESSION: 1. Unremarkable hardware. 2. Hazy lower and perihilar chest favoring edema. Electronically Signed   By: Jorje Guild M.D.   On: 05/30/2021 06:48  ? ?DG CHEST PORT 1 VIEW ? ?Result Date: 05/31/2021 ?CLINICAL DATA:  Pneumonia. EXAM: PORTABLE CHEST 1 VIEW COMPARISON:  May 27, 2021. FINDINGS: Stable cardiomegaly. Sternotomy wires are noted. No acute pulmonary disease is noted. Bony thorax is unremarkable. IMPRESSION: No active disease. Electronically Signed   By:  Marijo Conception M.D.   On: 06/15/2021 17:40  ? ?DG Chest Port 1 View ? ?Result Date: 05/10/2021 ?CLINICAL DATA:  Chest pain EXAM: PORTABLE CHEST 1 VIEW COMPARISON:  10/30/2020 FINDINGS: 0902 hours. The cardio pericardial silhouette is enlarged. The lungs are clear without focal pneumonia, edema, pneumothorax or pleural effusion. Status post CABG. Bones are diffusely demineralized. Telemetry leads overlie the chest. IMPRESSION: No acute cardiopulmonary findings. Electronically Signed   By: Misty Stanley M.D.   On: 05/25/2021 09:37  ? ?ECHOCARDIOGRAM COMPLETE ? ?Result Date: 06/08/2021 ?   ECHOCARDIOGRAM REPORT   Patient Name:   EARNST COONTS Date of Exam: 06/11/2021 Medical Rec #:  [id number]         Height:       73.0 in Accession #:    [id number]        Weight:       224.0 lb Date of Birth:  July 26, 1955         BSA:          2.258 m? Patient Age:    21 years          BP:           145/69 mmHg Patient Gender: M                 HR:           80 bpm. Exam Location:  Inpatient Procedure: 2D Echo, Color Doppler and Cardiac Doppler Indications:    R07.9* Chest pain, unspecified  History:        Patient has prior history of Echocardiogram examinations, most                 recent 10/16/2020. Prior CABG;  Risk Factors:Hypertension,                 Diabetes and Dyslipidemia.  Sonographer:    Raquel Sarna Senior RDCS Referring Phys: [id number] [employer]  1. Left ventricular ejection fraction, by estimation, is 45 to 50%. The left ventricle has mildly decreased function. The left ventricle demonstrates regional wall motion abnormalities (see scoring diagram/findings for description). There is moderate concentric left ventricular hypertrophy. Left ventricular diastolic parameters are consistent with Grade III diastolic dysfunction (restrictive). There is akinesis of the left ventricular, apical anterior segment. There is mild hypokinesis of the left ventricular, basal inferior wall and inferolateral wall.  2. Right  ventricular systolic function is normal. The right ventricular size is normal.  3. Left atrial size was severely dilated.  4. Right atrial size was mildly dilated.  5. The mitral valve is grossly normal. Mild to moderate

## 2021-06-28 NOTE — Progress Notes (Addendum)
2237 pt went in sustained Vtach. Pt conscious but with sedation and with a pulse. Called for assistance and CCM camera to assist. ? ?2239  sync. Defibrillation at 120 J  completed and back into a SR. Maintained bp during this time. Elink Dr. Bradd Canary recommendation is to get stat bmp and Mag level  ? ?2245 Dr. Emmit Alexanders to bedside to eval pt. Ordered increase in amiodarone drip and bolus.  ? ?2311 pt back into sustained Vtach and sync defibrillation 120J at this time. Pt b/p remains stable.  ? ?26 notified Dr. Ok Edwards on for cardiology of these events. He is in agreement with the plan and will come see the pt.  ? ?2340 Dr. Ok Edwards to bedside and also communicated with Elink via monitor. Keep Lidocaine drip the same. Send lidocaine level. Continue to monitor with increased amiodarone rate. Keep patient sedated. EKG done and reported ? ?2345 Electrolytes addressed per CCM ? ?2350 Pt wife updated on Vtach and defib event. Also updated on plan of care.  ? ?0008 Repeat EKG done and  reported to Dr. Ok Edwards  ? ?0015 Called [facility] ( Dr. Levada Schilling) to request potassium replacement  ?

## 2021-06-28 NOTE — Progress Notes (Signed)
? ?NAME:  Darrell [name], MRN:  [id number], DOB:  [birth year], LOS: 4 ?ADMISSION DATE:  05/25/2021, CONSULTATION DATE: 05/30/2021 ?REFERRING MD: Rapid response team, CHIEF COMPLAINT: S/p torsade ? ?History of Present Illness:  ?66 year old male with coronary artery disease s/p CABG, prior history of VT arrest who initially presented with chest pain, noted to have acute non-ST elevation MI, medically managed as patient refused cardiac catheterization. ?On 5/3 in early morning hours patient was noted to go into ventricular tachycardia and then torsade, he became confused, rapid response was called, he was given magnesium, IV amiodarone, he was intubated and was transferred to ICU.  PCCM was consulted for help evaluation and management ? ?Pertinent  Medical History  ? ?Past Medical History:  ?Diagnosis Date  ? CKD (chronic kidney disease), stage III ([place])   ? Claudication [facility])   ? Coronary artery disease   ? a. 08/2001 s/p DES to RCA/RI;  b. 07/2006 s/p DES to p/d LCX;  c. 11/2007 CABGx5 (LIMA->LAD, VG->D1, LRA->OM1, VG->PDA->LPL); c. 03/2015: Synergy DES to mid-Cx in the setting of NSTEMI; d. 11/2015 NSTEMI->Med Rx. e. NSTEMI 01/2018 -> med rx.  ? Diabetes mellitus type 2 in nonobese [facility])   ? a. A1c 7.0 0000000.  ? Diastolic dysfunction   ? a. 02/2016 Echo: EF 50-55%, gr1DD, Ao sclerosis, dil Ao root (27mm), sev dil LA, triv TR, PASP 71mmHg.  ? Hyperlipidemia   ? Hypertension   ? Noncompliance   ? Right carotid bruit   ? a. noted 01/2018, will need OP eval when patient complies with follow-up.  ? ? ? ?Significant Hospital Events: ?Including procedures, antibiotic start and stop dates in addition to other pertinent events   ?5/5 recurrent VT arrest on floor ? ?Interim History / Subjective:  ?Shocked twice again overnight.  Amio increased. ?Making a little bit of urine. ?Sedated on vent. ? ?Objective   ?Blood pressure (!) 124/44, pulse (!) 58, temperature (!) 96.4 ?F (35.8 ?C), temperature source Axillary, resp. rate 16,  height 6\' 1"  (1.854 m), weight 101 kg, SpO2 100 %. ?CVP:  [8 mmHg-19 mmHg] 9 mmHg  ?Vent Mode: PRVC ?FiO2 (%):  [40 %] 40 % ?Set Rate:  [16 bmp] 16 bmp ?Vt Set:  ST:[id number] mL] 640 mL ?PEEP:  [8 cmH20] 8 cmH20 ?Plateau Pressure:  [18 cmH20-22 cmH20] 20 cmH20  ? ?Intake/Output Summary (Last 24 hours) at 06/18/2021 0944 ?Last data filed at Jun 18, 2021 0907 ?Gross per 24 hour  ?Intake 4013.52 ml  ?Output 970 ml  ?Net 3043.52 ml  ? ? ?Filed Weights  ? 05/25/2021 0838 06/01/21 0213 Jun 18, 2021 0230  ?Weight: 101.6 kg 97.4 kg 101 kg  ? ? ?Examination: ?No distress ?Less awake today ?Lungs with crackles ?Ext mild edema ?Heart sounds regular, sinus brady on monitor ? ?Cr drifting up ?K okay ?CBC stable ?SvO2 82 ? ?Resolved Hospital Problem list   ?Hypertensive emergency ? 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 ?Tough spot, discussed with cardiology and nephro: all agree we unfortunately lost window for cath due to patient refusal.   Now he is in progressive renal failure and not candidate for dialysis.  EF is not bad but continues to have recurrent ischemic VT.  We will attempt salvage diuretics and inotropes  but if fails this he is unfortunately at end of life.  Have asked nurse to call wife in to discuss. ? ?Should he respond to diuretics and finally get toward euvolemia we can consider staged PCI and eventual consideration for AICD. ? ?Best Practice (right click and "Reselect all SmartList Selections" daily)  ? ?Diet/type: TF ?DVT prophylaxis: heparin drip ?GI prophylaxis: N/A ?Lines: Central line, discontinue ?Foley:  N/A ?Code Status:  full code ?Last date of multidisciplinary goals of care discussion - wife to come in  today ? ?31 min cc time ?Erskine Emery MD PCCM ? ? ?

## 2021-06-28 DEATH — deceased

## 2022-01-17 ENCOUNTER — Other Ambulatory Visit: Payer: Self-pay

## 2022-05-01 IMAGING — DX DG CHEST 1V PORT
1 series · 1 of 1 positions shown · non-contrast
Comparison: One-view chest x-ray 10/16/2020

CLINICAL DATA: Fever.

EXAM:
PORTABLE CHEST 1 VIEW

[chest ap]
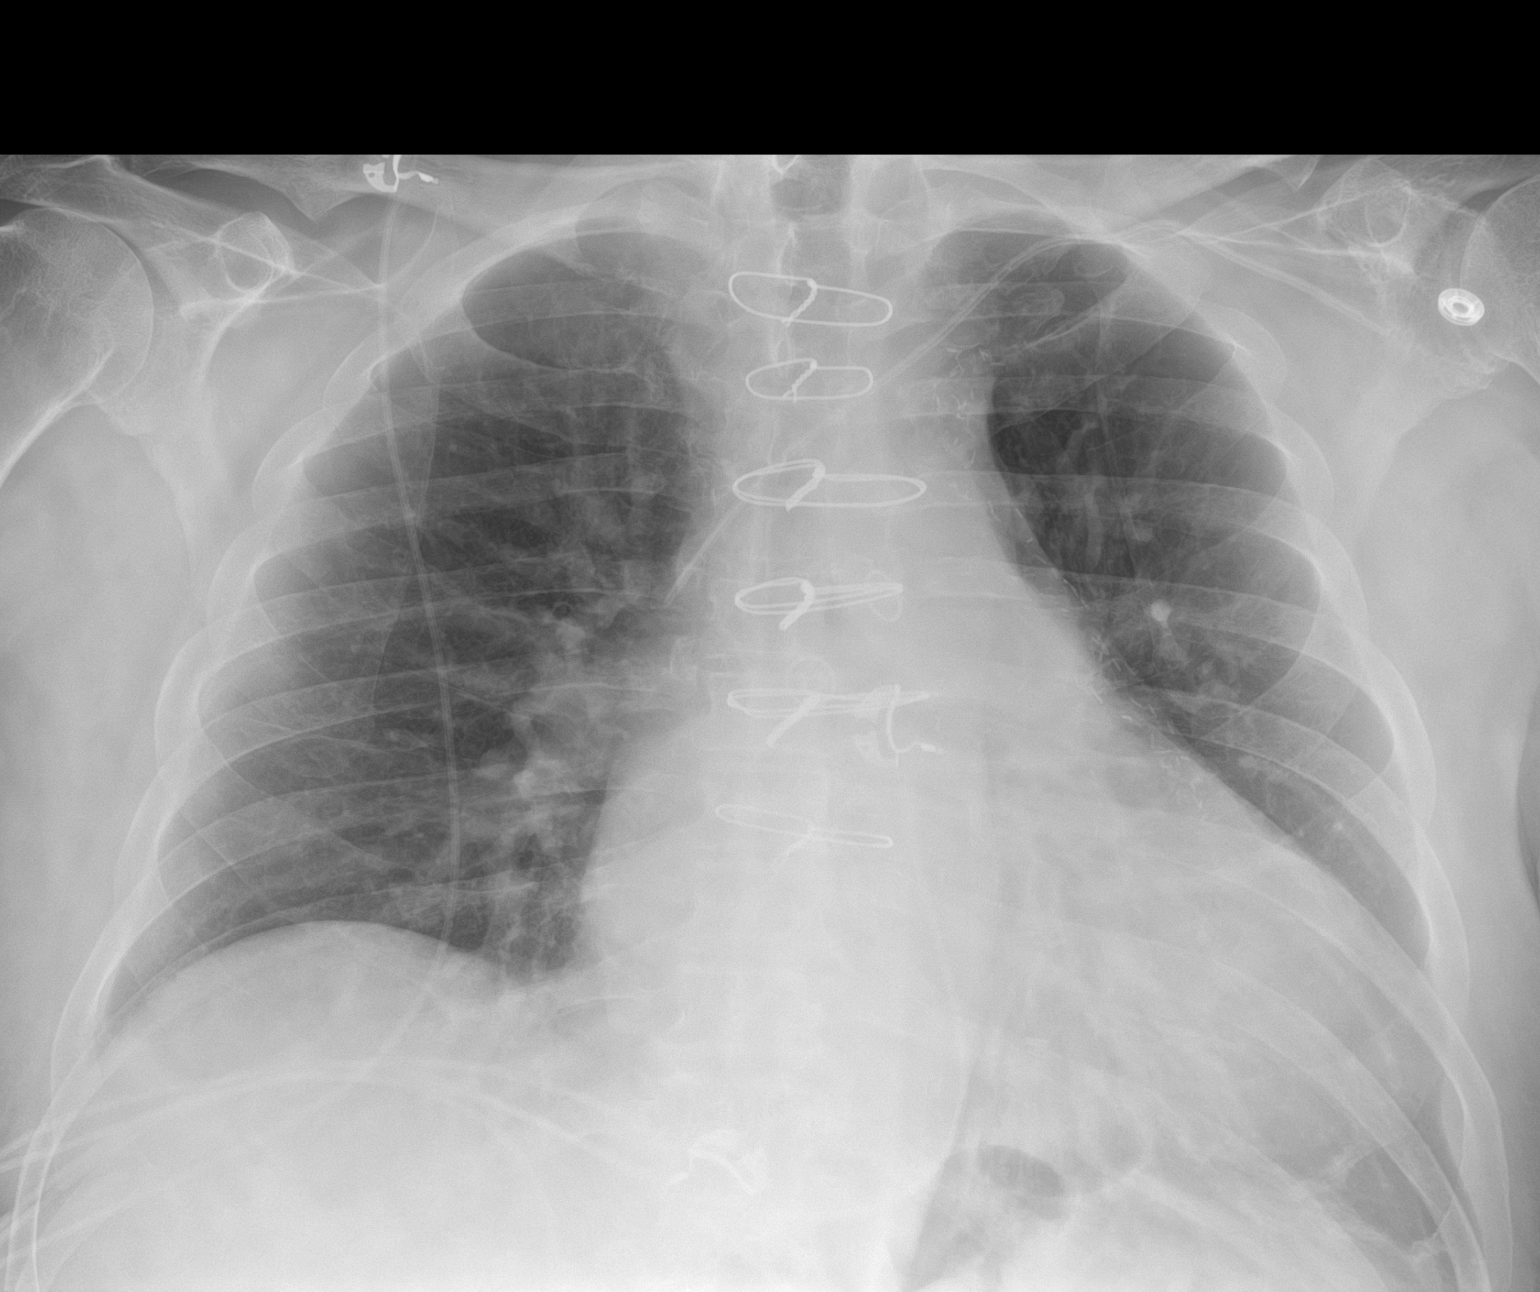

[1 of 1 positions shown; findings below may reference images not displayed]

FINDINGS: The heart is enlarged. No edema or effusion is present. No focal
airspace disease is present. Patient is status post median
sternotomy. A left subclavian line is in place.
IMPRESSION: No active disease.
